# Patient Record
Sex: Male | Born: 1965 | Race: White | Hispanic: No | Marital: Married | Smoking: Former smoker
Health system: Southern US, Community
[De-identification: ages and names within clinical notes are randomized; demographics above are authoritative.]

## PROBLEM LIST (undated history)

## (undated) DIAGNOSIS — I2510 Atherosclerotic heart disease of native coronary artery without angina pectoris: Secondary | ICD-10-CM

## (undated) DIAGNOSIS — I10 Essential (primary) hypertension: Secondary | ICD-10-CM

## (undated) DIAGNOSIS — E785 Hyperlipidemia, unspecified: Secondary | ICD-10-CM

## (undated) DIAGNOSIS — E119 Type 2 diabetes mellitus without complications: Secondary | ICD-10-CM

## (undated) HISTORY — PX: CORONARY ARTERY BYPASS GRAFT: SHX141

---

## 2012-07-30 DIAGNOSIS — I10 Essential (primary) hypertension: Secondary | ICD-10-CM | POA: Diagnosis present

## 2012-07-30 DIAGNOSIS — E785 Hyperlipidemia, unspecified: Secondary | ICD-10-CM | POA: Diagnosis present

## 2016-01-11 DIAGNOSIS — I214 Non-ST elevation (NSTEMI) myocardial infarction: Secondary | ICD-10-CM | POA: Insufficient documentation

## 2016-01-11 DIAGNOSIS — E119 Type 2 diabetes mellitus without complications: Secondary | ICD-10-CM | POA: Diagnosis present

## 2019-06-12 DIAGNOSIS — Z22322 Carrier or suspected carrier of Methicillin resistant Staphylococcus aureus: Secondary | ICD-10-CM | POA: Insufficient documentation

## 2019-09-29 DIAGNOSIS — E1159 Type 2 diabetes mellitus with other circulatory complications: Secondary | ICD-10-CM | POA: Diagnosis not present

## 2019-09-29 DIAGNOSIS — L03113 Cellulitis of right upper limb: Secondary | ICD-10-CM | POA: Diagnosis not present

## 2019-10-22 DIAGNOSIS — M545 Low back pain: Secondary | ICD-10-CM | POA: Diagnosis not present

## 2019-10-22 DIAGNOSIS — F17210 Nicotine dependence, cigarettes, uncomplicated: Secondary | ICD-10-CM | POA: Diagnosis not present

## 2019-10-22 DIAGNOSIS — F419 Anxiety disorder, unspecified: Secondary | ICD-10-CM | POA: Diagnosis not present

## 2019-10-23 DIAGNOSIS — Z1210 Encounter for screening for malignant neoplasm of intestinal tract, unspecified: Secondary | ICD-10-CM | POA: Diagnosis not present

## 2019-11-20 DIAGNOSIS — F419 Anxiety disorder, unspecified: Secondary | ICD-10-CM | POA: Diagnosis not present

## 2019-11-20 DIAGNOSIS — M545 Low back pain: Secondary | ICD-10-CM | POA: Diagnosis not present

## 2019-12-04 DIAGNOSIS — J340 Abscess, furuncle and carbuncle of nose: Secondary | ICD-10-CM | POA: Diagnosis not present

## 2019-12-23 DIAGNOSIS — E139 Other specified diabetes mellitus without complications: Secondary | ICD-10-CM | POA: Diagnosis not present

## 2019-12-23 DIAGNOSIS — L03113 Cellulitis of right upper limb: Secondary | ICD-10-CM | POA: Diagnosis not present

## 2019-12-23 DIAGNOSIS — E1159 Type 2 diabetes mellitus with other circulatory complications: Secondary | ICD-10-CM | POA: Diagnosis not present

## 2019-12-23 DIAGNOSIS — E785 Hyperlipidemia, unspecified: Secondary | ICD-10-CM | POA: Diagnosis not present

## 2019-12-23 DIAGNOSIS — I2510 Atherosclerotic heart disease of native coronary artery without angina pectoris: Secondary | ICD-10-CM | POA: Diagnosis not present

## 2019-12-23 DIAGNOSIS — M545 Low back pain: Secondary | ICD-10-CM | POA: Diagnosis not present

## 2019-12-23 DIAGNOSIS — L0291 Cutaneous abscess, unspecified: Secondary | ICD-10-CM | POA: Diagnosis not present

## 2020-01-20 DIAGNOSIS — M545 Low back pain: Secondary | ICD-10-CM | POA: Diagnosis not present

## 2020-01-20 DIAGNOSIS — E785 Hyperlipidemia, unspecified: Secondary | ICD-10-CM | POA: Diagnosis not present

## 2020-01-20 DIAGNOSIS — F419 Anxiety disorder, unspecified: Secondary | ICD-10-CM | POA: Diagnosis not present

## 2020-01-20 DIAGNOSIS — F331 Major depressive disorder, recurrent, moderate: Secondary | ICD-10-CM | POA: Diagnosis not present

## 2020-02-13 DIAGNOSIS — R194 Change in bowel habit: Secondary | ICD-10-CM | POA: Diagnosis not present

## 2020-02-13 DIAGNOSIS — R112 Nausea with vomiting, unspecified: Secondary | ICD-10-CM | POA: Diagnosis not present

## 2020-02-13 DIAGNOSIS — R1084 Generalized abdominal pain: Secondary | ICD-10-CM | POA: Diagnosis not present

## 2020-02-23 DIAGNOSIS — D123 Benign neoplasm of transverse colon: Secondary | ICD-10-CM | POA: Diagnosis not present

## 2020-02-23 DIAGNOSIS — K648 Other hemorrhoids: Secondary | ICD-10-CM | POA: Diagnosis not present

## 2020-02-23 DIAGNOSIS — D122 Benign neoplasm of ascending colon: Secondary | ICD-10-CM | POA: Diagnosis not present

## 2020-02-23 DIAGNOSIS — R194 Change in bowel habit: Secondary | ICD-10-CM | POA: Diagnosis not present

## 2020-02-24 DIAGNOSIS — K219 Gastro-esophageal reflux disease without esophagitis: Secondary | ICD-10-CM | POA: Diagnosis not present

## 2020-02-24 DIAGNOSIS — Z6829 Body mass index (BMI) 29.0-29.9, adult: Secondary | ICD-10-CM | POA: Diagnosis not present

## 2020-02-24 DIAGNOSIS — K582 Mixed irritable bowel syndrome: Secondary | ICD-10-CM | POA: Diagnosis not present

## 2020-02-24 DIAGNOSIS — M545 Low back pain: Secondary | ICD-10-CM | POA: Diagnosis not present

## 2020-03-18 DIAGNOSIS — Z6829 Body mass index (BMI) 29.0-29.9, adult: Secondary | ICD-10-CM | POA: Diagnosis not present

## 2020-03-18 DIAGNOSIS — M25512 Pain in left shoulder: Secondary | ICD-10-CM | POA: Diagnosis not present

## 2020-03-25 DIAGNOSIS — F419 Anxiety disorder, unspecified: Secondary | ICD-10-CM | POA: Diagnosis not present

## 2020-03-25 DIAGNOSIS — F331 Major depressive disorder, recurrent, moderate: Secondary | ICD-10-CM | POA: Diagnosis not present

## 2020-03-25 DIAGNOSIS — M545 Low back pain: Secondary | ICD-10-CM | POA: Diagnosis not present

## 2020-03-25 DIAGNOSIS — M25512 Pain in left shoulder: Secondary | ICD-10-CM | POA: Diagnosis not present

## 2020-04-22 DIAGNOSIS — E785 Hyperlipidemia, unspecified: Secondary | ICD-10-CM | POA: Diagnosis not present

## 2020-04-22 DIAGNOSIS — I10 Essential (primary) hypertension: Secondary | ICD-10-CM | POA: Diagnosis not present

## 2020-04-22 DIAGNOSIS — M545 Low back pain, unspecified: Secondary | ICD-10-CM | POA: Diagnosis not present

## 2020-04-22 DIAGNOSIS — E139 Other specified diabetes mellitus without complications: Secondary | ICD-10-CM | POA: Diagnosis not present

## 2020-04-22 DIAGNOSIS — M25512 Pain in left shoulder: Secondary | ICD-10-CM | POA: Diagnosis not present

## 2020-04-22 DIAGNOSIS — K219 Gastro-esophageal reflux disease without esophagitis: Secondary | ICD-10-CM | POA: Diagnosis not present

## 2020-04-22 DIAGNOSIS — F419 Anxiety disorder, unspecified: Secondary | ICD-10-CM | POA: Diagnosis not present

## 2020-04-22 DIAGNOSIS — E1159 Type 2 diabetes mellitus with other circulatory complications: Secondary | ICD-10-CM | POA: Diagnosis not present

## 2020-04-22 DIAGNOSIS — Z6828 Body mass index (BMI) 28.0-28.9, adult: Secondary | ICD-10-CM | POA: Diagnosis not present

## 2020-05-19 DIAGNOSIS — F331 Major depressive disorder, recurrent, moderate: Secondary | ICD-10-CM | POA: Diagnosis not present

## 2020-05-19 DIAGNOSIS — F419 Anxiety disorder, unspecified: Secondary | ICD-10-CM | POA: Diagnosis not present

## 2020-05-19 DIAGNOSIS — Z6829 Body mass index (BMI) 29.0-29.9, adult: Secondary | ICD-10-CM | POA: Diagnosis not present

## 2020-05-19 DIAGNOSIS — M545 Low back pain, unspecified: Secondary | ICD-10-CM | POA: Diagnosis not present

## 2020-06-17 DIAGNOSIS — F419 Anxiety disorder, unspecified: Secondary | ICD-10-CM | POA: Diagnosis not present

## 2020-06-17 DIAGNOSIS — M25512 Pain in left shoulder: Secondary | ICD-10-CM | POA: Diagnosis not present

## 2020-06-17 DIAGNOSIS — M545 Low back pain, unspecified: Secondary | ICD-10-CM | POA: Diagnosis not present

## 2020-06-17 DIAGNOSIS — Z6828 Body mass index (BMI) 28.0-28.9, adult: Secondary | ICD-10-CM | POA: Diagnosis not present

## 2020-06-25 DIAGNOSIS — Z20828 Contact with and (suspected) exposure to other viral communicable diseases: Secondary | ICD-10-CM | POA: Diagnosis not present

## 2020-07-01 DIAGNOSIS — R0981 Nasal congestion: Secondary | ICD-10-CM | POA: Diagnosis not present

## 2020-07-01 DIAGNOSIS — Z20822 Contact with and (suspected) exposure to covid-19: Secondary | ICD-10-CM | POA: Diagnosis not present

## 2020-07-01 DIAGNOSIS — R059 Cough, unspecified: Secondary | ICD-10-CM | POA: Diagnosis not present

## 2020-07-01 DIAGNOSIS — Z1152 Encounter for screening for COVID-19: Secondary | ICD-10-CM | POA: Diagnosis not present

## 2020-07-01 DIAGNOSIS — J029 Acute pharyngitis, unspecified: Secondary | ICD-10-CM | POA: Diagnosis not present

## 2020-07-14 DIAGNOSIS — H00011 Hordeolum externum right upper eyelid: Secondary | ICD-10-CM | POA: Diagnosis not present

## 2020-07-14 DIAGNOSIS — Z6829 Body mass index (BMI) 29.0-29.9, adult: Secondary | ICD-10-CM | POA: Diagnosis not present

## 2020-07-15 DIAGNOSIS — M545 Low back pain, unspecified: Secondary | ICD-10-CM | POA: Diagnosis not present

## 2020-07-15 DIAGNOSIS — Z6829 Body mass index (BMI) 29.0-29.9, adult: Secondary | ICD-10-CM | POA: Diagnosis not present

## 2020-07-15 DIAGNOSIS — H00011 Hordeolum externum right upper eyelid: Secondary | ICD-10-CM | POA: Diagnosis not present

## 2020-07-15 DIAGNOSIS — E1159 Type 2 diabetes mellitus with other circulatory complications: Secondary | ICD-10-CM | POA: Diagnosis not present

## 2020-07-30 DIAGNOSIS — Z1152 Encounter for screening for COVID-19: Secondary | ICD-10-CM | POA: Diagnosis not present

## 2020-07-30 DIAGNOSIS — L02416 Cutaneous abscess of left lower limb: Secondary | ICD-10-CM | POA: Diagnosis not present

## 2020-07-30 DIAGNOSIS — Z20822 Contact with and (suspected) exposure to covid-19: Secondary | ICD-10-CM | POA: Diagnosis not present

## 2020-07-30 DIAGNOSIS — E1159 Type 2 diabetes mellitus with other circulatory complications: Secondary | ICD-10-CM | POA: Diagnosis not present

## 2020-08-19 DIAGNOSIS — F419 Anxiety disorder, unspecified: Secondary | ICD-10-CM | POA: Diagnosis not present

## 2020-08-19 DIAGNOSIS — Z6828 Body mass index (BMI) 28.0-28.9, adult: Secondary | ICD-10-CM | POA: Diagnosis not present

## 2020-08-19 DIAGNOSIS — M25512 Pain in left shoulder: Secondary | ICD-10-CM | POA: Diagnosis not present

## 2020-08-19 DIAGNOSIS — M545 Low back pain, unspecified: Secondary | ICD-10-CM | POA: Diagnosis not present

## 2020-09-09 DIAGNOSIS — L0201 Cutaneous abscess of face: Secondary | ICD-10-CM | POA: Diagnosis not present

## 2020-09-09 DIAGNOSIS — L0390 Cellulitis, unspecified: Secondary | ICD-10-CM | POA: Diagnosis not present

## 2020-09-15 DIAGNOSIS — M545 Low back pain, unspecified: Secondary | ICD-10-CM | POA: Diagnosis not present

## 2020-09-15 DIAGNOSIS — F419 Anxiety disorder, unspecified: Secondary | ICD-10-CM | POA: Diagnosis not present

## 2020-09-15 DIAGNOSIS — E1159 Type 2 diabetes mellitus with other circulatory complications: Secondary | ICD-10-CM | POA: Diagnosis not present

## 2020-09-15 DIAGNOSIS — M25512 Pain in left shoulder: Secondary | ICD-10-CM | POA: Diagnosis not present

## 2020-10-22 DIAGNOSIS — F419 Anxiety disorder, unspecified: Secondary | ICD-10-CM | POA: Diagnosis not present

## 2020-10-22 DIAGNOSIS — E1159 Type 2 diabetes mellitus with other circulatory complications: Secondary | ICD-10-CM | POA: Diagnosis not present

## 2020-10-22 DIAGNOSIS — M545 Low back pain, unspecified: Secondary | ICD-10-CM | POA: Diagnosis not present

## 2020-10-22 DIAGNOSIS — Z0000 Encounter for general adult medical examination without abnormal findings: Secondary | ICD-10-CM | POA: Diagnosis not present

## 2020-10-29 DIAGNOSIS — L0201 Cutaneous abscess of face: Secondary | ICD-10-CM | POA: Diagnosis not present

## 2020-11-25 DIAGNOSIS — M545 Low back pain, unspecified: Secondary | ICD-10-CM | POA: Diagnosis not present

## 2020-11-25 DIAGNOSIS — F419 Anxiety disorder, unspecified: Secondary | ICD-10-CM | POA: Diagnosis not present

## 2020-12-11 DIAGNOSIS — N281 Cyst of kidney, acquired: Secondary | ICD-10-CM | POA: Diagnosis not present

## 2020-12-11 DIAGNOSIS — N2889 Other specified disorders of kidney and ureter: Secondary | ICD-10-CM | POA: Diagnosis not present

## 2020-12-11 DIAGNOSIS — I10 Essential (primary) hypertension: Secondary | ICD-10-CM | POA: Diagnosis not present

## 2020-12-11 DIAGNOSIS — I700 Atherosclerosis of aorta: Secondary | ICD-10-CM | POA: Diagnosis not present

## 2020-12-11 DIAGNOSIS — R0902 Hypoxemia: Secondary | ICD-10-CM | POA: Diagnosis not present

## 2020-12-11 DIAGNOSIS — K760 Fatty (change of) liver, not elsewhere classified: Secondary | ICD-10-CM | POA: Diagnosis not present

## 2020-12-11 DIAGNOSIS — E119 Type 2 diabetes mellitus without complications: Secondary | ICD-10-CM | POA: Diagnosis not present

## 2020-12-11 DIAGNOSIS — Z79899 Other long term (current) drug therapy: Secondary | ICD-10-CM | POA: Diagnosis not present

## 2020-12-11 DIAGNOSIS — R109 Unspecified abdominal pain: Secondary | ICD-10-CM | POA: Diagnosis not present

## 2020-12-11 DIAGNOSIS — M549 Dorsalgia, unspecified: Secondary | ICD-10-CM | POA: Diagnosis not present

## 2020-12-11 DIAGNOSIS — M545 Low back pain, unspecified: Secondary | ICD-10-CM | POA: Diagnosis not present

## 2020-12-12 ENCOUNTER — Emergency Department (HOSPITAL_COMMUNITY): Payer: Federal, State, Local not specified - PPO

## 2020-12-12 ENCOUNTER — Encounter (HOSPITAL_COMMUNITY): Payer: Self-pay | Admitting: Emergency Medicine

## 2020-12-12 ENCOUNTER — Other Ambulatory Visit: Payer: Self-pay

## 2020-12-12 ENCOUNTER — Inpatient Hospital Stay (HOSPITAL_COMMUNITY)
Admission: EM | Admit: 2020-12-12 | Discharge: 2020-12-26 | DRG: 853 | Disposition: A | Discharge status: Home Health | Payer: Federal, State, Local not specified - PPO | Source: Other Acute Inpatient Hospital | Attending: Internal Medicine | Admitting: Internal Medicine

## 2020-12-12 DIAGNOSIS — F419 Anxiety disorder, unspecified: Secondary | ICD-10-CM | POA: Diagnosis present

## 2020-12-12 DIAGNOSIS — F17210 Nicotine dependence, cigarettes, uncomplicated: Secondary | ICD-10-CM | POA: Diagnosis not present

## 2020-12-12 DIAGNOSIS — G062 Extradural and subdural abscess, unspecified: Secondary | ICD-10-CM | POA: Diagnosis not present

## 2020-12-12 DIAGNOSIS — M549 Dorsalgia, unspecified: Secondary | ICD-10-CM | POA: Diagnosis present

## 2020-12-12 DIAGNOSIS — G928 Other toxic encephalopathy: Secondary | ICD-10-CM | POA: Diagnosis not present

## 2020-12-12 DIAGNOSIS — E1169 Type 2 diabetes mellitus with other specified complication: Secondary | ICD-10-CM | POA: Diagnosis present

## 2020-12-12 DIAGNOSIS — M4804 Spinal stenosis, thoracic region: Secondary | ICD-10-CM | POA: Diagnosis present

## 2020-12-12 DIAGNOSIS — K6812 Psoas muscle abscess: Secondary | ICD-10-CM

## 2020-12-12 DIAGNOSIS — Z22322 Carrier or suspected carrier of Methicillin resistant Staphylococcus aureus: Secondary | ICD-10-CM

## 2020-12-12 DIAGNOSIS — M48061 Spinal stenosis, lumbar region without neurogenic claudication: Secondary | ICD-10-CM | POA: Diagnosis present

## 2020-12-12 DIAGNOSIS — E785 Hyperlipidemia, unspecified: Secondary | ICD-10-CM | POA: Diagnosis present

## 2020-12-12 DIAGNOSIS — M4624 Osteomyelitis of vertebra, thoracic region: Secondary | ICD-10-CM | POA: Diagnosis present

## 2020-12-12 DIAGNOSIS — R7881 Bacteremia: Secondary | ICD-10-CM | POA: Diagnosis not present

## 2020-12-12 DIAGNOSIS — M4620 Osteomyelitis of vertebra, site unspecified: Secondary | ICD-10-CM | POA: Diagnosis not present

## 2020-12-12 DIAGNOSIS — E119 Type 2 diabetes mellitus without complications: Secondary | ICD-10-CM | POA: Diagnosis not present

## 2020-12-12 DIAGNOSIS — T40605A Adverse effect of unspecified narcotics, initial encounter: Secondary | ICD-10-CM | POA: Diagnosis not present

## 2020-12-12 DIAGNOSIS — R6520 Severe sepsis without septic shock: Secondary | ICD-10-CM

## 2020-12-12 DIAGNOSIS — M4626 Osteomyelitis of vertebra, lumbar region: Secondary | ICD-10-CM | POA: Diagnosis present

## 2020-12-12 DIAGNOSIS — I2510 Atherosclerotic heart disease of native coronary artery without angina pectoris: Secondary | ICD-10-CM | POA: Diagnosis not present

## 2020-12-12 DIAGNOSIS — R0902 Hypoxemia: Secondary | ICD-10-CM | POA: Diagnosis not present

## 2020-12-12 DIAGNOSIS — M4646 Discitis, unspecified, lumbar region: Secondary | ICD-10-CM | POA: Diagnosis not present

## 2020-12-12 DIAGNOSIS — A4102 Sepsis due to Methicillin resistant Staphylococcus aureus: Principal | ICD-10-CM | POA: Diagnosis present

## 2020-12-12 DIAGNOSIS — Z951 Presence of aortocoronary bypass graft: Secondary | ICD-10-CM

## 2020-12-12 DIAGNOSIS — A419 Sepsis, unspecified organism: Secondary | ICD-10-CM

## 2020-12-12 DIAGNOSIS — N281 Cyst of kidney, acquired: Secondary | ICD-10-CM | POA: Diagnosis not present

## 2020-12-12 DIAGNOSIS — M869 Osteomyelitis, unspecified: Secondary | ICD-10-CM | POA: Diagnosis not present

## 2020-12-12 DIAGNOSIS — G061 Intraspinal abscess and granuloma: Secondary | ICD-10-CM | POA: Diagnosis present

## 2020-12-12 DIAGNOSIS — E1165 Type 2 diabetes mellitus with hyperglycemia: Secondary | ICD-10-CM | POA: Diagnosis not present

## 2020-12-12 DIAGNOSIS — F32A Depression, unspecified: Secondary | ICD-10-CM | POA: Diagnosis not present

## 2020-12-12 DIAGNOSIS — B9562 Methicillin resistant Staphylococcus aureus infection as the cause of diseases classified elsewhere: Secondary | ICD-10-CM | POA: Diagnosis not present

## 2020-12-12 DIAGNOSIS — Z20822 Contact with and (suspected) exposure to covid-19: Secondary | ICD-10-CM | POA: Diagnosis not present

## 2020-12-12 DIAGNOSIS — Z79899 Other long term (current) drug therapy: Secondary | ICD-10-CM

## 2020-12-12 DIAGNOSIS — Z833 Family history of diabetes mellitus: Secondary | ICD-10-CM

## 2020-12-12 DIAGNOSIS — E871 Hypo-osmolality and hyponatremia: Secondary | ICD-10-CM | POA: Diagnosis not present

## 2020-12-12 DIAGNOSIS — E663 Overweight: Secondary | ICD-10-CM | POA: Diagnosis not present

## 2020-12-12 DIAGNOSIS — Z7984 Long term (current) use of oral hypoglycemic drugs: Secondary | ICD-10-CM

## 2020-12-12 DIAGNOSIS — Z7982 Long term (current) use of aspirin: Secondary | ICD-10-CM

## 2020-12-12 DIAGNOSIS — M545 Low back pain, unspecified: Secondary | ICD-10-CM | POA: Diagnosis not present

## 2020-12-12 DIAGNOSIS — Z6829 Body mass index (BMI) 29.0-29.9, adult: Secondary | ICD-10-CM

## 2020-12-12 DIAGNOSIS — K5903 Drug induced constipation: Secondary | ICD-10-CM | POA: Diagnosis not present

## 2020-12-12 DIAGNOSIS — K5900 Constipation, unspecified: Secondary | ICD-10-CM | POA: Clinically undetermined

## 2020-12-12 DIAGNOSIS — Z8249 Family history of ischemic heart disease and other diseases of the circulatory system: Secondary | ICD-10-CM

## 2020-12-12 DIAGNOSIS — Z419 Encounter for procedure for purposes other than remedying health state, unspecified: Secondary | ICD-10-CM

## 2020-12-12 DIAGNOSIS — I10 Essential (primary) hypertension: Secondary | ICD-10-CM | POA: Diagnosis not present

## 2020-12-12 DIAGNOSIS — R600 Localized edema: Secondary | ICD-10-CM | POA: Diagnosis not present

## 2020-12-12 DIAGNOSIS — R000 Tachycardia, unspecified: Secondary | ICD-10-CM | POA: Diagnosis not present

## 2020-12-12 DIAGNOSIS — O85 Puerperal sepsis: Secondary | ICD-10-CM

## 2020-12-12 HISTORY — DX: Atherosclerotic heart disease of native coronary artery without angina pectoris: I25.10

## 2020-12-12 HISTORY — DX: Essential (primary) hypertension: I10

## 2020-12-12 HISTORY — DX: Type 2 diabetes mellitus without complications: E11.9

## 2020-12-12 HISTORY — DX: Hyperlipidemia, unspecified: E78.5

## 2020-12-12 LAB — BASIC METABOLIC PANEL
Anion gap: 14 (ref 5–15)
BUN: 16 mg/dL (ref 6–20)
CO2: 21 mmol/L — ABNORMAL LOW (ref 22–32)
Calcium: 8.5 mg/dL — ABNORMAL LOW (ref 8.9–10.3)
Chloride: 93 mmol/L — ABNORMAL LOW (ref 98–111)
Creatinine, Ser: 1.02 mg/dL (ref 0.61–1.24)
GFR, Estimated: >60 mL/min (ref >60)
Glucose, Bld: 156 mg/dL — ABNORMAL HIGH (ref 70–99)
Potassium: 4.2 mmol/L (ref 3.5–5.1)
Sodium: 128 mmol/L — ABNORMAL LOW (ref 135–145)

## 2020-12-12 LAB — URINALYSIS, ROUTINE W REFLEX MICROSCOPIC
Glucose, UA: >=500 mg/dL — AB
Ketones, ur: >80 mg/dL — AB
Leukocytes,Ua: NEGATIVE
Nitrite: NEGATIVE
Protein, ur: 30 mg/dL — AB
Specific Gravity, Urine: 1.025 (ref 1.005–1.030)
pH: 5.5 (ref 5.0–8.0)

## 2020-12-12 LAB — URINALYSIS, MICROSCOPIC (REFLEX)
Bacteria, UA: NONE SEEN
Squamous Epithelial / HPF: NONE SEEN (ref 0–5)

## 2020-12-12 LAB — CBC
HCT: 48.4 % (ref 39.0–52.0)
Hemoglobin: 16.4 g/dL (ref 13.0–17.0)
MCH: 30.4 pg (ref 26.0–34.0)
MCHC: 33.9 g/dL (ref 30.0–36.0)
MCV: 89.6 fL (ref 80.0–100.0)
Platelets: 224 10*3/uL (ref 150–400)
RBC: 5.4 MIL/uL (ref 4.22–5.81)
RDW: 14.1 % (ref 11.5–15.5)
WBC: 11.8 10*3/uL — ABNORMAL HIGH (ref 4.0–10.5)
nRBC: 0 % (ref 0.0–0.2)

## 2020-12-12 LAB — LACTIC ACID, PLASMA: Lactic Acid, Venous: 1 mmol/L (ref 0.5–1.9)

## 2020-12-12 MED ORDER — ONDANSETRON HCL 4 MG/2ML IJ SOLN: 4.0000 mg | Freq: Once | INTRAMUSCULAR | Status: AC

## 2020-12-12 MED ORDER — HYDROMORPHONE HCL 1 MG/ML IJ SOLN
1.0000 mg | INTRAMUSCULAR | Status: DC | PRN
  Administered 2020-12-13 – 2020-12-16 (×23): 1 mg via INTRAVENOUS
  Filled 2020-12-12 (×23): qty 1

## 2020-12-12 MED ORDER — VANCOMYCIN HCL 2000 MG/400ML IV SOLN
2000.0000 mg | Freq: Once | INTRAVENOUS | Status: AC
  Administered 2020-12-12: 2000 mg via INTRAVENOUS
  Filled 2020-12-12: qty 400

## 2020-12-12 MED ORDER — DIAZEPAM 5 MG/ML IJ SOLN
10.0000 mg | Freq: Once | INTRAMUSCULAR | Status: AC
  Administered 2020-12-12: 10 mg via INTRAVENOUS
  Filled 2020-12-12: qty 2

## 2020-12-12 MED ORDER — METOPROLOL SUCCINATE ER 25 MG PO TB24
25.0000 mg | ORAL_TABLET | Freq: Every day | ORAL | Status: DC
  Administered 2020-12-12 – 2020-12-25 (×13): 25 mg via ORAL
  Filled 2020-12-12 (×14): qty 1

## 2020-12-12 MED ORDER — ICOSAPENT ETHYL 1 G PO CAPS
2.0000 g | ORAL_CAPSULE | Freq: Two times a day (BID) | ORAL | Status: DC
  Administered 2020-12-13 – 2020-12-26 (×25): 2 g via ORAL
  Filled 2020-12-12: qty 2
  Filled 2020-12-12: qty 1
  Filled 2020-12-12 (×28): qty 2

## 2020-12-12 MED ORDER — LACTATED RINGERS IV BOLUS
1000.0000 mL | Freq: Once | INTRAVENOUS | Status: AC
  Administered 2020-12-12: 1000 mL via INTRAVENOUS

## 2020-12-12 MED ORDER — FENTANYL CITRATE (PF) 100 MCG/2ML IJ SOLN
100.0000 ug | Freq: Once | INTRAMUSCULAR | Status: AC
  Administered 2020-12-12: 100 ug via INTRAVENOUS
  Filled 2020-12-12: qty 2

## 2020-12-12 MED ORDER — INSULIN ASPART 100 UNIT/ML IJ SOLN
0.0000 [IU] | Freq: Three times a day (TID) | INTRAMUSCULAR | Status: DC
  Administered 2020-12-13: 2 [IU] via SUBCUTANEOUS
  Administered 2020-12-13 – 2020-12-15 (×5): 3 [IU] via SUBCUTANEOUS
  Administered 2020-12-15: 5 [IU] via SUBCUTANEOUS
  Administered 2020-12-15: 3 [IU] via SUBCUTANEOUS
  Administered 2020-12-16: 5 [IU] via SUBCUTANEOUS
  Administered 2020-12-16: 8 [IU] via SUBCUTANEOUS
  Administered 2020-12-17: 3 [IU] via SUBCUTANEOUS
  Administered 2020-12-17: 5 [IU] via SUBCUTANEOUS
  Administered 2020-12-18 – 2020-12-19 (×3): 3 [IU] via SUBCUTANEOUS
  Administered 2020-12-19 (×2): 5 [IU] via SUBCUTANEOUS
  Administered 2020-12-20 (×2): 3 [IU] via SUBCUTANEOUS
  Administered 2020-12-20: 2 [IU] via SUBCUTANEOUS
  Administered 2020-12-21 (×2): 3 [IU] via SUBCUTANEOUS
  Administered 2020-12-22: 11 [IU] via SUBCUTANEOUS
  Administered 2020-12-22: 8 [IU] via SUBCUTANEOUS
  Administered 2020-12-23: 5 [IU] via SUBCUTANEOUS
  Administered 2020-12-23: 8 [IU] via SUBCUTANEOUS
  Administered 2020-12-23 – 2020-12-24 (×3): 5 [IU] via SUBCUTANEOUS
  Administered 2020-12-24 – 2020-12-25 (×2): 8 [IU] via SUBCUTANEOUS
  Administered 2020-12-25: 3 [IU] via SUBCUTANEOUS
  Administered 2020-12-26: 5 [IU] via SUBCUTANEOUS
  Administered 2020-12-26: 3 [IU] via SUBCUTANEOUS

## 2020-12-12 MED ORDER — GADOBUTROL 1 MMOL/ML IV SOLN
9.5000 mL | Freq: Once | INTRAVENOUS | Status: AC | PRN
  Administered 2020-12-12: 9.5 mL via INTRAVENOUS

## 2020-12-12 MED ORDER — SODIUM CHLORIDE 0.9% FLUSH
3.0000 mL | Freq: Two times a day (BID) | INTRAVENOUS | Status: DC
  Administered 2020-12-12 – 2020-12-21 (×16): 3 mL via INTRAVENOUS

## 2020-12-12 MED ORDER — LACTATED RINGERS IV BOLUS
2000.0000 mL | Freq: Once | INTRAVENOUS | Status: AC
  Administered 2020-12-13: 2000 mL via INTRAVENOUS

## 2020-12-12 MED ORDER — BISACODYL 5 MG PO TBEC
5.0000 mg | DELAYED_RELEASE_TABLET | Freq: Every day | ORAL | Status: DC | PRN
  Administered 2020-12-15 – 2020-12-26 (×2): 5 mg via ORAL
  Filled 2020-12-12 (×2): qty 1

## 2020-12-12 MED ORDER — ASPIRIN EC 81 MG PO TBEC
81.0000 mg | DELAYED_RELEASE_TABLET | Freq: Every morning | ORAL | Status: DC
  Administered 2020-12-13 – 2020-12-19 (×6): 81 mg via ORAL
  Filled 2020-12-12 (×7): qty 1

## 2020-12-12 MED ORDER — ENOXAPARIN SODIUM 40 MG/0.4ML IJ SOSY
40.0000 mg | PREFILLED_SYRINGE | Freq: Every day | INTRAMUSCULAR | Status: DC
  Administered 2020-12-13 – 2020-12-20 (×7): 40 mg via SUBCUTANEOUS
  Filled 2020-12-12 (×8): qty 0.4

## 2020-12-12 MED ORDER — GABAPENTIN 100 MG PO CAPS
200.0000 mg | ORAL_CAPSULE | Freq: Three times a day (TID) | ORAL | Status: DC | PRN
  Administered 2020-12-13 – 2020-12-15 (×3): 200 mg via ORAL
  Filled 2020-12-12 (×3): qty 2

## 2020-12-12 MED ORDER — SODIUM CHLORIDE 0.9 % IV SOLN
2.0000 g | INTRAVENOUS | Status: DC
  Administered 2020-12-13: 2 g via INTRAVENOUS
  Filled 2020-12-12: qty 20

## 2020-12-12 MED ORDER — MORPHINE SULFATE (PF) 4 MG/ML IV SOLN
4.0000 mg | Freq: Once | INTRAVENOUS | Status: AC
  Administered 2020-12-12: 4 mg via INTRAVENOUS
  Filled 2020-12-12: qty 1

## 2020-12-12 MED ORDER — AMLODIPINE BESYLATE 5 MG PO TABS
2.5000 mg | ORAL_TABLET | Freq: Every morning | ORAL | Status: DC
  Administered 2020-12-13 – 2020-12-19 (×6): 2.5 mg via ORAL
  Filled 2020-12-12 (×7): qty 1

## 2020-12-12 MED ORDER — VENLAFAXINE HCL ER 75 MG PO CP24
75.0000 mg | ORAL_CAPSULE | Freq: Every day | ORAL | Status: DC
  Administered 2020-12-13 – 2020-12-26 (×13): 75 mg via ORAL
  Filled 2020-12-12 (×16): qty 1

## 2020-12-12 MED ORDER — ACETAMINOPHEN 650 MG RE SUPP: 650.0000 mg | Freq: Four times a day (QID) | RECTAL | Status: DC | PRN

## 2020-12-12 MED ORDER — PANTOPRAZOLE SODIUM 40 MG PO TBEC
40.0000 mg | DELAYED_RELEASE_TABLET | Freq: Every day | ORAL | Status: DC
  Administered 2020-12-13 – 2020-12-26 (×13): 40 mg via ORAL
  Filled 2020-12-12 (×14): qty 1

## 2020-12-12 MED ORDER — DICLOFENAC SODIUM 1 % EX GEL
1.0000 "application " | Freq: Four times a day (QID) | CUTANEOUS | Status: DC | PRN
  Administered 2020-12-14 – 2020-12-21 (×9): 1 via TOPICAL
  Filled 2020-12-12: qty 100

## 2020-12-12 MED ORDER — HYDROMORPHONE HCL 1 MG/ML IJ SOLN
1.0000 mg | Freq: Once | INTRAMUSCULAR | Status: AC
  Administered 2020-12-12: 1 mg via INTRAVENOUS
  Filled 2020-12-12: qty 1

## 2020-12-12 MED ORDER — ACETAMINOPHEN 325 MG PO TABS
650.0000 mg | ORAL_TABLET | Freq: Four times a day (QID) | ORAL | Status: DC | PRN
  Administered 2020-12-14 – 2020-12-24 (×5): 650 mg via ORAL
  Filled 2020-12-12 (×6): qty 2

## 2020-12-12 MED ORDER — HYDROMORPHONE HCL 1 MG/ML IJ SOLN
2.0000 mg | Freq: Once | INTRAMUSCULAR | Status: AC
  Administered 2020-12-12: 2 mg via INTRAVENOUS
  Filled 2020-12-12: qty 2

## 2020-12-12 MED ORDER — ATORVASTATIN CALCIUM 80 MG PO TABS
80.0000 mg | ORAL_TABLET | Freq: Every day | ORAL | Status: DC
  Administered 2020-12-13 – 2020-12-14 (×2): 80 mg via ORAL
  Filled 2020-12-12 (×2): qty 1

## 2020-12-12 MED ORDER — POLYETHYLENE GLYCOL 3350 17 G PO PACK: 17.0000 g | PACK | Freq: Every day | ORAL | Status: DC | PRN

## 2020-12-12 MED ORDER — HYDROCODONE-ACETAMINOPHEN 10-325 MG PO TABS
0.5000 | ORAL_TABLET | Freq: Three times a day (TID) | ORAL | Status: DC | PRN
Start: 2020-12-12 — End: 2020-12-16
  Administered 2020-12-13 – 2020-12-15 (×4): 1 via ORAL
  Filled 2020-12-12 (×4): qty 1

## 2020-12-12 MED ORDER — ALPRAZOLAM 0.25 MG PO TABS
0.2500 mg | ORAL_TABLET | Freq: Every evening | ORAL | Status: DC | PRN
  Administered 2020-12-13 – 2020-12-22 (×8): 0.25 mg via ORAL
  Filled 2020-12-12 (×9): qty 1

## 2020-12-12 MED ORDER — ONDANSETRON HCL 4 MG/2ML IJ SOLN
INTRAMUSCULAR | Status: AC
  Administered 2020-12-12: 4 mg via INTRAVENOUS
  Filled 2020-12-12: qty 2

## 2020-12-12 MED ORDER — VANCOMYCIN HCL 1250 MG/250ML IV SOLN
1250.0000 mg | Freq: Two times a day (BID) | INTRAVENOUS | Status: DC
  Administered 2020-12-13 – 2020-12-14 (×4): 1250 mg via INTRAVENOUS
  Filled 2020-12-12 (×5): qty 250

## 2020-12-12 MED ORDER — TIZANIDINE HCL 2 MG PO TABS
4.0000 mg | ORAL_TABLET | Freq: Three times a day (TID) | ORAL | Status: DC | PRN
  Administered 2020-12-13 – 2020-12-20 (×10): 4 mg via ORAL
  Filled 2020-12-12 (×10): qty 2

## 2020-12-12 MED ORDER — FUROSEMIDE 20 MG PO TABS
20.0000 mg | ORAL_TABLET | Freq: Every morning | ORAL | Status: DC
  Administered 2020-12-13 – 2020-12-16 (×4): 20 mg via ORAL
  Filled 2020-12-12 (×4): qty 1

## 2020-12-12 MED ORDER — LOSARTAN POTASSIUM 25 MG PO TABS
25.0000 mg | ORAL_TABLET | Freq: Every morning | ORAL | Status: DC
  Administered 2020-12-13 – 2020-12-19 (×6): 25 mg via ORAL
  Filled 2020-12-12 (×7): qty 1

## 2020-12-12 MED ORDER — LIDOCAINE 5 % EX PTCH
1.0000 | MEDICATED_PATCH | Freq: Every day | CUTANEOUS | Status: DC | PRN
  Administered 2020-12-15: 1 via TRANSDERMAL
  Filled 2020-12-12 (×2): qty 1

## 2020-12-12 NOTE — Progress Notes (Signed)
Pharmacy Antibiotic Note  Louis [name] is a 55 y.o. male admitted on 12/12/2020 with  osteomyelitis/discitis .  Pharmacy has been consulted for Vancomycin dosing. WBC is mildly elevated. Renal function good.   Plan: Vancomycin 2000 mg IV x 1, then 1250 mg IV q12h >>Estimated AUC: 487 F/U gram negative coverage if desired by provider Trend WBC, temp, renal function  F/U infectious work-up Drug levels as indicated   Height: 5\' 11"  (180.3 cm) Weight: 95.3 kg (210 lb) IBW/kg (Calculated) : 75.3  Temp (24hrs), Avg:98.8 F (37.1 C), Min:98.8 F (37.1 C), Max:98.8 F (37.1 C)  Recent Labs  Lab 12/12/20 2044  WBC 11.8*  CREATININE 1.02    Estimated Creatinine Clearance: 94.1 mL/min (by C-G formula based on SCr of 1.02 mg/dL).    No Known Allergies  2045, PharmD, BCPS Clinical Pharmacist Phone: [phone]

## 2020-12-12 NOTE — ED Provider Notes (Signed)
[facility] EMERGENCY DEPARTMENT Provider Note   CSN: [id number] Arrival date & time: 12/12/20  1556     History Chief Complaint  Patient presents with   Back Pain    Louis [name] is a 55 y.o. male.  HPI    55 year old man complaining of low back pain radiating to the left knee.  Pain began on Friday and was atraumatic in nature.  Patient was seen at [facility] yesterday and had a CT of abdomen and pelvis without contrast.  There is no evidence of urolithiasis, hydronephrosis COVID, or other acute findings.  Mild hepatic steatosis.  A 4 cm cystic lesion was noted in the left kidney.  No musculoskeletal suspicious lesions were noted.  Patient identifies this pain is 10 out of 10 and similar to prior pain from his back with a disc.  He denies weakness, and numbness, or tingling. Past Medical History:  Diagnosis Date   CAD (coronary artery disease)    DM (diabetes mellitus) (HCC)    Hyperlipidemia    Hypertension     There are no problems to display for this patient.   History reviewed. No pertinent surgical history.     No family history on file.     Home Medications Prior to Admission medications   Not on File    Allergies    Patient has no allergy information on record.  Review of Systems   Review of Systems  All other systems reviewed and are negative.  Physical Exam Updated Vital Signs BP (!) 177/91 (BP Location: Right Arm)   Pulse (!) 109   Resp 20   Ht 1.803 m (5\' 11" )   Wt 95.3 kg   SpO2 99%   BMI 29.29 kg/m   Physical Exam Vitals and nursing note reviewed.  Constitutional:      Appearance: Normal appearance.  HENT:     Head: Normocephalic.     Right Ear: External ear normal.     Left Ear: External ear normal.     Nose: Nose normal.     Mouth/Throat:     Pharynx: Oropharynx is clear.  Eyes:     Pupils: Pupils are equal, round, and reactive to light.  Cardiovascular:     Rate and Rhythm: Normal rate and regular  rhythm.     Pulses: Normal pulses.  Pulmonary:     Effort: Pulmonary effort is normal.  Abdominal:     General: Abdomen is flat.     Palpations: Abdomen is soft.  Musculoskeletal:        General: Normal range of motion.     Cervical back: Normal range of motion.     Comments: External signs of trauma, redness, or swelling of back No tenderness palpation   Skin:    General: Skin is warm.     Capillary Refill: Capillary refill takes less than 2 seconds.  Neurological:     General: No focal deficit present.     Mental Status: He is alert and oriented to person, place, and time.     Cranial Nerves: No cranial nerve deficit.     Sensory: No sensory deficit.     Motor: No weakness.     Coordination: Coordination normal.     Gait: Gait normal.     Deep Tendon Reflexes: Reflexes normal.  Psychiatric:        Mood and Affect: Mood normal.    ED Results / Procedures / Treatments   Labs (all labs ordered are  listed, but only abnormal results are displayed) Labs Reviewed - No data to display  EKG None  Radiology MR LUMBAR SPINE WO CONTRAST  Result Date: 12/12/2020 CLINICAL DATA:  Severe left-sided low back pain. EXAM: MRI LUMBAR SPINE WITHOUT CONTRAST TECHNIQUE: Multiplanar, multisequence MR imaging of the lumbar spine was performed. No intravenous contrast was administered. COMPARISON:  CT urogram 12/11/2020 FINDINGS: Segmentation: 5 non rib-bearing lumbar type vertebral bodies are present. The lowest fully formed vertebral body is L5. Alignment: No significant listhesis is present. There is some straightening of the normal cervical lordosis. Vertebrae: Edematous endplate changes are present along the left side of the endplates at L3-4 and L4-5. Diffuse edematous changes are present at L5-S1. Marrow signal and vertebral body heights are otherwise normal. Conus medullaris and cauda equina: Conus extends to the L1-2 level. Conus and cauda equina appear normal. Paraspinal and other soft  tissues: Edematous changes are present in the left paraspinous soft tissues, centered at L3-4 L4-5. There are changes into the left psoas muscle without a discrete abscess. Disc levels: L1-2: Negative. L2-3: Broad-based disc protrusion is asymmetric to the right. No significant stenosis. L3-4: Abnormal fluid signal is present in the disc space. Broad-based disc protrusion is present. Moderate left and mild right foraminal narrowing is present. L4-5: A broad-based disc protrusion is present. Mild facet hypertrophy is noted bilaterally. Crowding of nerve roots is present. Abnormal fluid signal is present in the disc space, worse on the left. Moderate foraminal narrowing is present bilaterally. L5-S1: Broad-based disc protrusion is asymmetric to the right. Moderate foraminal narrowing is worse on the right. Fluid signal is present in the disc space. IMPRESSION: 1. Abnormal fluid signal in the disc spaces at L3-4, L4-5, and L5-S1 concerning for early disc osteomyelitis. 2. Edematous changes in the left paraspinous soft tissues and left psoas muscle compatible with acute myositis. No discrete abscess is present. 3. Moderate left and mild right foraminal stenosis at L3-4. 4. Moderate foraminal narrowing bilaterally at L4-5 and L5-S1. These results were called by telephone at the time of interpretation on 12/12/2020 at 8:34 pm to provider [facility]a Shawn , who verbally acknowledged these results. Electronically Signed   By: Marin Roberts M.D.   On: 12/12/2020 20:36    Procedures .Critical Care  Date/Time: 12/12/2020 11:53 PM Performed by: Margarita Grizzle, MD Authorized by: Margarita Grizzle, MD   Critical care provider statement:    Critical care end time:  12/12/2020 11:53 PM   Medications Ordered in ED Medications  morphine 4 MG/ML injection 4 mg (has no administration in time range)    ED Course  I have reviewed the triage vital signs and the nursing notes.  Pertinent labs & imaging results that were  available during my care of the patient were reviewed by me and considered in my medical decision making (see chart for details).    MDM Rules/Calculators/A&P                         55 year old male presents today with severe low back pain.  CT obtained yesterday at [facility] showed questionable area and mild the left renal that is likely unrelated to this pain.  However due to previous CT: Patient MRI of his back and abdomen today.  MRI is concerning for abnormal fluid signal consistent with osteomyelitis L3-S1.  He does have a history of MRSA on his records from the past.  Patient is dosed in the ED with vancomycin per pharmacy.  He  has edematous changes in the left paraspinous soft tissue and left psoas consistent consistent with acute myositis.  No discrete abscess is present.  Care was discussed with neurosurgery, who does not feel the need to be involved at this time.  Plan medicine admission for IV antibiotics and management. 11:52 PM Lactic acid normal.  HR decreased to 110, bp stable/high. Patient appears stable for admission.  Final Clinical Impression(s) / ED Diagnoses Final diagnoses:  Sepsis [facility])    Rx / DC Orders ED Discharge Orders     None        Margarita Grizzle, MD 12/12/20 2353

## 2020-12-12 NOTE — ED Notes (Signed)
Patient transported to MRI 

## 2020-12-12 NOTE — ED Notes (Signed)
Pt transported from MRI 

## 2020-12-12 NOTE — H&P (Addendum)
History and Physical   Louis Mollony Lenart ZOX:[id number] DOB: 03/04/1966 DOA: 12/12/2020  PCP: Pcp, No   Patient coming from: Home  Chief Complaint: Back pain  HPI: Louis [name] is a 55 y.o. male with medical history significant of CAD, hyperlipidemia, hypertension, diabetes, anxiety, depression who presents with left-sided back pain for the past 2 days. Patient states that he has had left-sided back pain for the past 2 days.  [facility] and had [facility] CT done there which showed renal cyst but no other explanation for his back pain.  States that he continues to have 10 out of 10 back pain which is difficult to describe, but is located in his lower back along the midline with intermittent radiation to his leg. He also reports some dry heaves.  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  He denies fevers, chills, chest pain, shortness of breath, abdominal pain, constipation, diarrhea  ED Course: Vital signs in the ED significant for initial blood pressure in the 170s increased to the 200s systolic likely due to pain.  Initial heart rate in the 100s increasing to the 120s likely due to pain.  Lab work-up showed sodium 128, chloride 93, bicarb 21, glucose 156, calcium 8.5.  Leukocytosis to 11.8 on CBC.  Urine culture, blood culture, lactic acid, PTT, PT, INR pending.  Patient received pain medication, vancomycin, Zofran, liter fluids in the ED.  Review of Systems: As per HPI otherwise all other systems reviewed and are negative.  Past Medical History:  Diagnosis Date   CAD (coronary artery disease)    DM (diabetes mellitus) (HCC)    Hyperlipidemia    Hypertension     History reviewed. No pertinent surgical history.  Social History  has no history on file for tobacco use, alcohol use, and drug use.  No Known Allergies  History reviewed. No pertinent family history.  Prior to Admission medications   Medication Sig Start Date End Date Taking? Authorizing Provider  acetaminophen  (TYLENOL) 500 MG tablet Take 2,000 mg by mouth daily as needed (pain).   Yes [provider]  ALPRAZolam (XANAX) 0.25 MG tablet Take 0.25 mg by mouth at bedtime as needed for anxiety or sleep.   Yes [provider]  amLODipine (NORVASC) 2.5 MG tablet Take 2.5 mg by mouth every morning.   Yes [provider]  aspirin EC 81 MG tablet Take 81 mg by mouth every morning. Swallow whole.   Yes [provider]  atorvastatin (LIPITOR) 80 MG tablet Take 80 mg by mouth at bedtime.   Yes [provider]  dapagliflozin propanediol (FARXIGA) 10 MG TABS tablet Take 10 mg by mouth every morning.   Yes [provider]  diclofenac Sodium (VOLTAREN) 1 % GEL Apply 1 application topically 4 (four) times daily as needed (pain).   Yes [provider]  furosemide (LASIX) 20 MG tablet Take 20 mg by mouth every morning.   Yes [provider]  gabapentin (NEURONTIN) 100 MG capsule Take 200 mg by mouth 3 (three) times daily as needed (pain).   Yes [provider]  glipiZIDE (GLUCOTROL) 5 MG tablet Take 5 mg by mouth 2 (two) times daily.   Yes [provider]  HYDROcodone-acetaminophen (NORCO) 10-325 MG tablet Take 0.5-1 tablets by mouth every 8 (eight) hours as needed (pain).   Yes [provider]  icosapent Ethyl (VASCEPA) 1 g capsule Take 2 g by mouth 2 (two) times daily.   Yes  [provider]  lidocaine (LIDODERM) 5 % Place 1 patch onto the skin daily as needed (pain). Remove & Discard patch within 12 hours or as directed by MD   Yes [provider]  losartan (COZAAR) 25 MG tablet Take 25 mg by mouth every morning.   Yes [provider]  metFORMIN (GLUCOPHAGE) 500 MG tablet Take 500-1,000 mg by mouth See admin instructions. Take 2 tablets (1000 mg) by mouth every morning and 1 tablet (500 mg) at night   Yes [provider]  metoprolol succinate (TOPROL-XL) 25 MG 24 hr tablet Take 25 mg by  mouth at bedtime.   Yes [provider]  omeprazole (PRILOSEC) 20 MG capsule Take 20 mg by mouth every morning.   Yes [provider]  rosuvastatin (CRESTOR) 20 MG tablet Take 20 mg by mouth every Sunday. night   Yes [provider]  tiZANidine (ZANAFLEX) 4 MG tablet Take 4 mg by mouth 3 (three) times daily as needed for muscle spasms.   Yes [provider]  venlafaxine XR (EFFEXOR-XR) 75 MG 24 hr capsule Take 75 mg by mouth every morning.   Yes [provider]    Physical Exam: Vitals:   12/12/20 1605 12/12/20 1606 12/12/20 2122  BP: (!) 177/91  (!) 210/110  Pulse: (!) 109  (!) 132  Resp: 20  20  Temp:   98.8 F (37.1 C)  TempSrc:   Oral  SpO2: 99%  95%  Weight:  95.3 kg   Height:  5\' 11"  (1.803 m)    Physical Exam Constitutional:      General: He is not in acute distress.    Appearance: Normal appearance.  HENT:     Head: Normocephalic and atraumatic.     Mouth/Throat:     Mouth: Mucous membranes are moist.     Pharynx: Oropharynx is clear.  Eyes:     Extraocular Movements: Extraocular movements intact.     Pupils: Pupils are equal, round, and reactive to light.  Cardiovascular:     Rate and Rhythm: Regular rhythm. Tachycardia present.     Pulses: Normal pulses.     Heart sounds: Normal heart sounds.  Pulmonary:     Effort: Pulmonary effort is normal. No respiratory distress.     Breath sounds: Normal breath sounds.  Abdominal:     General: Bowel sounds are normal. There is no distension.     Palpations: Abdomen is soft.     Tenderness: There is no abdominal tenderness.  Musculoskeletal:        General: No swelling or deformity.  Skin:    General: Skin is warm and dry.  Neurological:     General: No focal deficit present.     Mental Status: Mental status is at baseline.   Labs on Admission: I have personally reviewed following labs and imaging studies  CBC: Recent Labs  Lab 12/12/20 2044  WBC 11.8*  HGB 16.4   HCT 48.4  MCV 89.6  PLT 224    Basic Metabolic Panel: Recent Labs  Lab 12/12/20 2044  NA 128*  K 4.2  CL 93*  CO2 21*  GLUCOSE 156*  BUN 16  CREATININE 1.02  CALCIUM 8.5*    GFR: Estimated Creatinine Clearance: 94.1 mL/min (by C-G formula based on SCr of 1.02 mg/dL).  Liver Function Tests: No results for input(s): AST, ALT, ALKPHOS, BILITOT, PROT, ALBUMIN in the last 168 hours.  Urine analysis: No results found for: COLORURINE, APPEARANCEUR, LABSPEC, PHURINE, GLUCOSEU,  HGBUR, BILIRUBINUR, KETONESUR, PROTEINUR, UROBILINOGEN, NITRITE, LEUKOCYTESUR  Radiological Exams on Admission: DG Chest 1 View  Result Date: 12/12/2020 CLINICAL DATA:  Sepsis.  Patient reports back and leg pain. EXAM: CHEST  1 VIEW COMPARISON:  Lung bases from abdominal CT yesterday. FINDINGS: Post median sternotomy and CABG. Normal heart size for technique. No focal airspace disease. No pleural effusion, pneumothorax, or pulmonary edema. No acute osseous abnormalities are seen. IMPRESSION: No acute abnormality. Electronically Signed   By: Narda Rutherford M.D.   On: 12/12/2020 23:03   MR LUMBAR SPINE WO CONTRAST  Result Date: 12/12/2020 CLINICAL DATA:  Severe left-sided low back pain. EXAM: MRI LUMBAR SPINE WITHOUT CONTRAST TECHNIQUE: Multiplanar, multisequence MR imaging of the lumbar spine was performed. No intravenous contrast was administered. COMPARISON:  CT urogram 12/11/2020 FINDINGS: Segmentation: 5 non rib-bearing lumbar type vertebral bodies are present. The lowest fully formed vertebral body is L5. Alignment: No significant listhesis is present. There is some straightening of the normal cervical lordosis. Vertebrae: Edematous endplate changes are present along the left side of the endplates at L3-4 and L4-5. Diffuse edematous changes are present at L5-S1. Marrow signal and vertebral body heights are otherwise normal. Conus medullaris and cauda equina: Conus extends to the L1-2 level. Conus and cauda  equina appear normal. Paraspinal and other soft tissues: Edematous changes are present in the left paraspinous soft tissues, centered at L3-4 L4-5. There are changes into the left psoas muscle without a discrete abscess. Disc levels: L1-2: Negative. L2-3: Broad-based disc protrusion is asymmetric to the right. No significant stenosis. L3-4: Abnormal fluid signal is present in the disc space. Broad-based disc protrusion is present. Moderate left and mild right foraminal narrowing is present. L4-5: A broad-based disc protrusion is present. Mild facet hypertrophy is noted bilaterally. Crowding of nerve roots is present. Abnormal fluid signal is present in the disc space, worse on the left. Moderate foraminal narrowing is present bilaterally. L5-S1: Broad-based disc protrusion is asymmetric to the right. Moderate foraminal narrowing is worse on the right. Fluid signal is present in the disc space. IMPRESSION: 1. Abnormal fluid signal in the disc spaces at L3-4, L4-5, and L5-S1 concerning for early disc osteomyelitis. 2. Edematous changes in the left paraspinous soft tissues and left psoas muscle compatible with acute myositis. No discrete abscess is present. 3. Moderate left and mild right foraminal stenosis at L3-4. 4. Moderate foraminal narrowing bilaterally at L4-5 and L5-S1. These results were called by telephone at the time of interpretation on 12/12/2020 at 8:34 pm to provider [facility] RAY , who verbally acknowledged these results. Electronically Signed   By: Marin Roberts M.D.   On: 12/12/2020 20:36   MR Abdomen W or Wo Contrast  Result Date: 12/12/2020 CLINICAL DATA:  Evaluate left kidney cyst EXAM: MRI ABDOMEN WITHOUT AND WITH CONTRAST TECHNIQUE: Multiplanar multisequence MR imaging of the abdomen was performed both before and after the administration of intravenous contrast. CONTRAST:  9.37mL GADAVIST GADOBUTROL 1 MMOL/ML IV SOLN COMPARISON:  CT AP 12/11/2020 FINDINGS: Lower chest: No acute findings.  Hepatobiliary: No mass or other parenchymal abnormality identified. Pancreas: No mass, inflammatory changes, or other parenchymal abnormality identified. Spleen:  Within normal limits in size and appearance. Adrenals/Urinary Tract: Normal adrenal glands. Bilateral perinephric fat stranding is identified. No hydronephrosis identified bilaterally. Cyst arising off the inferior pole of the left kidney measures 4.2 by 3.3 cm, image 35/7. There is no internal septation or mural nodule identified. No enhancement identified. Stomach/Bowel: Visualized portions within the abdomen are unremarkable.  Vascular/Lymphatic: No pathologically enlarged lymph nodes identified. No abdominal aortic aneurysm demonstrated. Other:  None. Musculoskeletal: There are signs of discitis/osteomyelitis involving the L3-4, L4-5 and L5-S1 levels. Surrounding edema and enhancement extends into the left psoas muscle. No discrete fluid collection identified to suggest abscess. IMPRESSION: 1. 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 No suspicious kidney lesions identified at this time. 2. Findings compatible with discitis/osteomyelitis involving the L3-4, L4-5 and L5-S1 levels. Surrounding edema and enhancement extends into the left psoas muscle. No discrete fluid collection identified to suggest abscess. See lumbar spine report from today. Electronically Signed   By: Signa Kell M.D.   On: 12/12/2020 20:49    EKG: Not yet performed.  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  Due to persistent back pain had MRI performed here which showed concern for  osteomyelitis/discitis at L3-L4, L4-L5, L5-S1.  Also concern for surrounding myositis without abscess noted. > Patient started on vancomycin and neurosurgery was consulted, neurosurgery states there is no need for surgical intervention at this time and to reconsult if needed. - Monitor on telemetry - Continue vancomycin, add ceftriaxone - Trend fever curve and white blood cell count - Consider ID consult - Continue as needed pain medication (Dilaudid)  Tachycardia > Heart rate in the 100s to 120s in the ED likely driven by his pain. - Continue pain medication as above - F/U ECG - Give PM Metoprolol  CAD Hyperlipidemia Hypertension > Significantly elevated blood pressure in the ED initially in the 170s and then up to the 200s likely due to pain. - Continue home amlodipine, Lasix, losartan, metoprolol, Vascepa, atorvastatin - Holding once weekly rosuvastatin  Diabetes - SSI  Anxiety Depression - Continue home Xanax and venlafaxine  DVT prophylaxis: Lovenox  Code Status:   Full  Family Communication:  Wife updated at bedside Disposition Plan:   Patient is from:  Home  Anticipated DC to:  Home  Anticipated DC date:  1 to 7 days  Anticipated DC barriers: None  Consults called:  Neurosurgery consulted in the ED, state they are not needed at this time.  Could consider ID consult tomorrow.   Admission status:  Observation, telemetry   Severity of Illness: The appropriate patient status for this patient is OBSERVATION. Observation status is judged to be reasonable and necessary in order to provide the required intensity of service to ensure the patient's safety. The patient's presenting symptoms, physical exam findings, and initial radiographic and laboratory data in the context of their medical condition is felt to place them at decreased risk for further clinical deterioration. Furthermore, it is anticipated that the patient will be medically stable for discharge from the hospital  within 2 midnights of admission. The following factors support the patient status of observation.   " The patient's presenting symptoms include back pain. " The physical exam findings include back pain, tachycardia. " The initial radiographic and laboratory data are leukocytosis to 11.8.  MRI of the L-spine with concern for osteomyelitis/discitis at L3-L4, L4-L5, L5-S1.  Abdomen is changes concerning for myositis surrounding without abscess.  Synetta Fail MD [facility]  How to contact the  [facility] Attending or Consulting provider 7A - 7P or covering provider during after hours 7P -7A, for this patient?   Check the care team in [facility] and look for a) attending/consulting [facility] provider listed and b) the [facility] team listed Log into [email or web address] and use [place]'s universal password to access. If you do not have the password, please contact the hospital operator. Locate the [facility] provider you are looking for under [facility] and page to a number that you can be directly reached. If you still have difficulty reaching the provider, please page the [facility] (Director on Call) for the Hospitalists listed on amion for assistance.  12/12/2020, 11:20 PM

## 2020-12-12 NOTE — ED Triage Notes (Signed)
Pt BIB [facility] with complaints of left side back pain that started yesterday. Pt was seen at [facility] for same and diagnosed with just lower back pain along with a renal mass. Pt was d/c'ed with hydrocodone and sent home. Pt still complaining of 10/10 severe lower left back pain that radiates down to the L knee. When asked to describe the pain the patient states, "it just hurts". Pt dry heaving during triage. EMS administered 4 mg zofran IV. Pt denies chest pain, SoB, issues with urination.  VSS per EMS.

## 2020-12-13 ENCOUNTER — Encounter (HOSPITAL_COMMUNITY): Payer: Self-pay | Admitting: Internal Medicine

## 2020-12-13 DIAGNOSIS — I2510 Atherosclerotic heart disease of native coronary artery without angina pectoris: Secondary | ICD-10-CM | POA: Diagnosis not present

## 2020-12-13 DIAGNOSIS — A4102 Sepsis due to Methicillin resistant Staphylococcus aureus: Secondary | ICD-10-CM | POA: Diagnosis not present

## 2020-12-13 DIAGNOSIS — F419 Anxiety disorder, unspecified: Secondary | ICD-10-CM | POA: Diagnosis present

## 2020-12-13 DIAGNOSIS — M5126 Other intervertebral disc displacement, lumbar region: Secondary | ICD-10-CM | POA: Diagnosis not present

## 2020-12-13 DIAGNOSIS — E785 Hyperlipidemia, unspecified: Secondary | ICD-10-CM | POA: Diagnosis not present

## 2020-12-13 DIAGNOSIS — B9562 Methicillin resistant Staphylococcus aureus infection as the cause of diseases classified elsewhere: Secondary | ICD-10-CM | POA: Diagnosis not present

## 2020-12-13 DIAGNOSIS — M4649 Discitis, unspecified, multiple sites in spine: Secondary | ICD-10-CM | POA: Diagnosis not present

## 2020-12-13 DIAGNOSIS — Z79899 Other long term (current) drug therapy: Secondary | ICD-10-CM | POA: Diagnosis not present

## 2020-12-13 DIAGNOSIS — F32A Depression, unspecified: Secondary | ICD-10-CM | POA: Diagnosis present

## 2020-12-13 DIAGNOSIS — M545 Low back pain, unspecified: Secondary | ICD-10-CM | POA: Diagnosis not present

## 2020-12-13 DIAGNOSIS — R4182 Altered mental status, unspecified: Secondary | ICD-10-CM | POA: Diagnosis not present

## 2020-12-13 DIAGNOSIS — G061 Intraspinal abscess and granuloma: Secondary | ICD-10-CM | POA: Diagnosis not present

## 2020-12-13 DIAGNOSIS — Z20822 Contact with and (suspected) exposure to covid-19: Secondary | ICD-10-CM | POA: Diagnosis not present

## 2020-12-13 DIAGNOSIS — Z6829 Body mass index (BMI) 29.0-29.9, adult: Secondary | ICD-10-CM | POA: Diagnosis not present

## 2020-12-13 DIAGNOSIS — G9520 Unspecified cord compression: Secondary | ICD-10-CM | POA: Diagnosis not present

## 2020-12-13 DIAGNOSIS — I10 Essential (primary) hypertension: Secondary | ICD-10-CM | POA: Diagnosis not present

## 2020-12-13 DIAGNOSIS — M50223 Other cervical disc displacement at C6-C7 level: Secondary | ICD-10-CM | POA: Diagnosis not present

## 2020-12-13 DIAGNOSIS — R7881 Bacteremia: Secondary | ICD-10-CM | POA: Diagnosis not present

## 2020-12-13 DIAGNOSIS — K5730 Diverticulosis of large intestine without perforation or abscess without bleeding: Secondary | ICD-10-CM | POA: Diagnosis not present

## 2020-12-13 DIAGNOSIS — E1165 Type 2 diabetes mellitus with hyperglycemia: Secondary | ICD-10-CM | POA: Diagnosis present

## 2020-12-13 DIAGNOSIS — M4804 Spinal stenosis, thoracic region: Secondary | ICD-10-CM | POA: Diagnosis not present

## 2020-12-13 DIAGNOSIS — K6812 Psoas muscle abscess: Secondary | ICD-10-CM | POA: Diagnosis not present

## 2020-12-13 DIAGNOSIS — M869 Osteomyelitis, unspecified: Secondary | ICD-10-CM | POA: Diagnosis not present

## 2020-12-13 DIAGNOSIS — E1169 Type 2 diabetes mellitus with other specified complication: Secondary | ICD-10-CM | POA: Diagnosis present

## 2020-12-13 DIAGNOSIS — K6389 Other specified diseases of intestine: Secondary | ICD-10-CM | POA: Diagnosis not present

## 2020-12-13 DIAGNOSIS — M5021 Other cervical disc displacement,  high cervical region: Secondary | ICD-10-CM | POA: Diagnosis not present

## 2020-12-13 DIAGNOSIS — M4805 Spinal stenosis, thoracolumbar region: Secondary | ICD-10-CM | POA: Diagnosis not present

## 2020-12-13 DIAGNOSIS — M48061 Spinal stenosis, lumbar region without neurogenic claudication: Secondary | ICD-10-CM | POA: Diagnosis present

## 2020-12-13 DIAGNOSIS — G062 Extradural and subdural abscess, unspecified: Secondary | ICD-10-CM | POA: Diagnosis not present

## 2020-12-13 DIAGNOSIS — Z951 Presence of aortocoronary bypass graft: Secondary | ICD-10-CM | POA: Diagnosis not present

## 2020-12-13 DIAGNOSIS — K5903 Drug induced constipation: Secondary | ICD-10-CM | POA: Diagnosis not present

## 2020-12-13 DIAGNOSIS — E663 Overweight: Secondary | ICD-10-CM | POA: Diagnosis present

## 2020-12-13 DIAGNOSIS — Z7984 Long term (current) use of oral hypoglycemic drugs: Secondary | ICD-10-CM | POA: Diagnosis not present

## 2020-12-13 DIAGNOSIS — M4626 Osteomyelitis of vertebra, lumbar region: Secondary | ICD-10-CM | POA: Diagnosis not present

## 2020-12-13 DIAGNOSIS — Z7982 Long term (current) use of aspirin: Secondary | ICD-10-CM | POA: Diagnosis not present

## 2020-12-13 DIAGNOSIS — Z981 Arthrodesis status: Secondary | ICD-10-CM | POA: Diagnosis not present

## 2020-12-13 DIAGNOSIS — M609 Myositis, unspecified: Secondary | ICD-10-CM | POA: Diagnosis not present

## 2020-12-13 DIAGNOSIS — M4807 Spinal stenosis, lumbosacral region: Secondary | ICD-10-CM | POA: Diagnosis not present

## 2020-12-13 DIAGNOSIS — E871 Hypo-osmolality and hyponatremia: Secondary | ICD-10-CM | POA: Diagnosis present

## 2020-12-13 DIAGNOSIS — M4624 Osteomyelitis of vertebra, thoracic region: Secondary | ICD-10-CM | POA: Diagnosis not present

## 2020-12-13 DIAGNOSIS — G928 Other toxic encephalopathy: Secondary | ICD-10-CM | POA: Diagnosis not present

## 2020-12-13 DIAGNOSIS — Z9889 Other specified postprocedural states: Secondary | ICD-10-CM | POA: Diagnosis not present

## 2020-12-13 DIAGNOSIS — G9589 Other specified diseases of spinal cord: Secondary | ICD-10-CM | POA: Diagnosis not present

## 2020-12-13 DIAGNOSIS — M4802 Spinal stenosis, cervical region: Secondary | ICD-10-CM | POA: Diagnosis not present

## 2020-12-13 DIAGNOSIS — M4620 Osteomyelitis of vertebra, site unspecified: Secondary | ICD-10-CM | POA: Diagnosis not present

## 2020-12-13 DIAGNOSIS — N281 Cyst of kidney, acquired: Secondary | ICD-10-CM | POA: Diagnosis not present

## 2020-12-13 DIAGNOSIS — F17210 Nicotine dependence, cigarettes, uncomplicated: Secondary | ICD-10-CM | POA: Diagnosis present

## 2020-12-13 LAB — BLOOD CULTURE ID PANEL (REFLEXED) - BCID2

## 2020-12-13 LAB — COMPREHENSIVE METABOLIC PANEL
ALT: 40 U/L (ref 0–44)
AST: 44 U/L — ABNORMAL HIGH (ref 15–41)
Albumin: 2.9 g/dL — ABNORMAL LOW (ref 3.5–5.0)
Alkaline Phosphatase: 65 U/L (ref 38–126)
Anion gap: 14 (ref 5–15)
BUN: 15 mg/dL (ref 6–20)
CO2: 17 mmol/L — ABNORMAL LOW (ref 22–32)
Calcium: 8.1 mg/dL — ABNORMAL LOW (ref 8.9–10.3)
Chloride: 96 mmol/L — ABNORMAL LOW (ref 98–111)
Creatinine, Ser: 1.09 mg/dL (ref 0.61–1.24)
GFR, Estimated: >60 mL/min (ref >60)
Glucose, Bld: 188 mg/dL — ABNORMAL HIGH (ref 70–99)
Potassium: 4 mmol/L (ref 3.5–5.1)
Sodium: 127 mmol/L — ABNORMAL LOW (ref 135–145)
Total Bilirubin: 1.9 mg/dL — ABNORMAL HIGH (ref 0.3–1.2)
Total Protein: 5.9 g/dL — ABNORMAL LOW (ref 6.5–8.1)

## 2020-12-13 LAB — CBC
HCT: 45.7 % (ref 39.0–52.0)
Hemoglobin: 15.2 g/dL (ref 13.0–17.0)
MCH: 30.5 pg (ref 26.0–34.0)
MCHC: 33.3 g/dL (ref 30.0–36.0)
MCV: 91.6 fL (ref 80.0–100.0)
Platelets: 178 10*3/uL (ref 150–400)
RBC: 4.99 MIL/uL (ref 4.22–5.81)
RDW: 14.2 % (ref 11.5–15.5)
WBC: 12 10*3/uL — ABNORMAL HIGH (ref 4.0–10.5)
nRBC: 0 % (ref 0.0–0.2)

## 2020-12-13 LAB — CBG MONITORING, ED
Glucose-Capillary: 173 mg/dL — ABNORMAL HIGH (ref 70–99)
Glucose-Capillary: 167 mg/dL — ABNORMAL HIGH (ref 70–99)
Glucose-Capillary: 139 mg/dL — ABNORMAL HIGH (ref 70–99)

## 2020-12-13 LAB — HEMOGLOBIN A1C
Hgb A1c MFr Bld: 9.1 % — ABNORMAL HIGH (ref 4.8–5.6)
Mean Plasma Glucose: 214 mg/dL

## 2020-12-13 LAB — PROTIME-INR
INR: 1.3 — ABNORMAL HIGH (ref 0.8–1.2)
Prothrombin Time: 15.7 seconds — ABNORMAL HIGH (ref 11.4–15.2)

## 2020-12-13 LAB — GLUCOSE, CAPILLARY: Glucose-Capillary: 144 mg/dL — ABNORMAL HIGH (ref 70–99)

## 2020-12-13 LAB — APTT: aPTT: 28 seconds (ref 24–36)

## 2020-12-13 LAB — SARS CORONAVIRUS 2 (TAT 6-24 HRS): SARS Coronavirus 2: NEGATIVE

## 2020-12-13 LAB — LACTIC ACID, PLASMA: Lactic Acid, Venous: 1.1 mmol/L (ref 0.5–1.9)

## 2020-12-13 LAB — HIV ANTIBODY (ROUTINE TESTING W REFLEX): HIV Screen 4th Generation wRfx: NONREACTIVE

## 2020-12-13 MED ORDER — ONDANSETRON HCL 4 MG/2ML IJ SOLN
4.0000 mg | Freq: Four times a day (QID) | INTRAMUSCULAR | Status: DC | PRN
  Administered 2020-12-13 – 2020-12-16 (×8): 4 mg via INTRAVENOUS
  Filled 2020-12-13 (×8): qty 2

## 2020-12-13 NOTE — ED Notes (Signed)
Breakfast orders placed 

## 2020-12-13 NOTE — ED Notes (Signed)
Attempted to call report x 1  

## 2020-12-13 NOTE — ED Notes (Signed)
Pt. Called, sent NT to room

## 2020-12-13 NOTE — ED Notes (Signed)
Patient called out. States he is in a lot of pain. Patient noted to be very uncomfortable and throwing up. Chole, RN Higher education careers adviser) Has been notified.

## 2020-12-13 NOTE — Progress Notes (Signed)
Staph aureus mec a 1/2 anaerobic PHARMACY - PHYSICIAN COMMUNICATION CRITICAL VALUE ALERT - BLOOD CULTURE IDENTIFICATION (BCID)  Lowell Makara is an 55 y.o. 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  Assessment:  blood cultures 1 of 2 growing MRSA in patient with known history of MRSA.  Name of physician (or Provider) Contacted: Timothy Opyd  Current antibiotics: ceftriaxone and vancomycin  Changes to prescribed antibiotics recommended:  Continue vancomycin but consider stopping ceftriaxone.  Recommendations accepted by provider, ceftriaxone discontinued.  Alphia Moh, PharmD, BCPS, BCCP Clinical Pharmacist  Please check AMION for all [facility] phone numbers After 10:00 PM, call Main Pharmacy [phone]

## 2020-12-13 NOTE — Progress Notes (Signed)
   12/13/20 1629  Vitals  Temp 97.7 F (36.5 C)  BP 131/84  MAP (mmHg) 100  BP Method Automatic  Pulse Rate (!) 112  Resp 16  MEWS COLOR  MEWS Score Color Yellow  Oxygen Therapy  SpO2 95 %  O2 Device Nasal Cannula  O2 Flow Rate (L/min) 2 L/min  MEWS Score  MEWS Temp 0  MEWS Systolic 0  MEWS Pulse 2  MEWS RR 0  MEWS LOC 0  MEWS Score 2    Paged provider regarding Yellow Mews.  Provider Okd earlier dose of pain medication.

## 2020-12-13 NOTE — Plan of Care (Signed)
°  Problem: Elimination: °Goal: Will not experience complications related to urinary retention °Outcome: Progressing °  °Problem: Pain Managment: °Goal: General experience of comfort will improve °Outcome: Not Progressing °  °

## 2020-12-13 NOTE — Progress Notes (Signed)
NEW ADMISSION NOTE New Admission Note:   Arrival Method: Stretcher Mental Orientation:  A&O X4 Telemetry: Box 8 Assessment: Completed Skin: Intact, see assessment for other IV: Right AC Pain: 9  1-10 scale Tubes: none Safety Measures: Safety Fall Prevention Plan has been given, discussed and signed Admission: Completed [facility] Orientation: Patient has been orientated to the room, unit and staff.  Family: Wife Bedside  Orders have been reviewed and implemented. Will continue to monitor the patient. Call light has been placed within reach and bed alarm has been activated.   Velia Meyer, RN

## 2020-12-13 NOTE — ED Notes (Signed)
Attempted to call report x2

## 2020-12-13 NOTE — Progress Notes (Signed)
PROGRESS NOTE    Louis [name]  YBO:[id number] DOB: [birth year] DOA: 12/12/2020 PCP: Pcp, No   Brief Narrative:  Louis [name] is a 55 y.o. male with medical history significant of CAD, hyperlipidemia, hypertension, diabetes, anxiety, depression who presents with left-sided back pain for the past 2 days. Patient states that he has had left-sided back pain for the past 2 days.  He was seen at [facility] and had a CT done there which showed renal cyst but no other explanation for his back pain.  States that he continues to have 10 out of 10 back pain which is difficult to describe, but is located in his lower back along the midline with intermittent radiation to his leg. He also reports some dry heaves.  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 He denies fevers, chills, chest pain, shortness of breath, abdominal pain, constipation, diarrhea. Admitted by [facility] for pain, antibiotic, and infection control given MRI remarkable for osteo/discitis at L3-S1 - Neurosurgery initially consulted but no surgical indications at this time.  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 -Pain moderately well controlled this morning, continue to titrate medications as needed    CAD Hyperlipidemia Hypertension -Hypertension reactive in the setting of above with associated pain tachycardia -Improving with pain control - Continue amlodipine, Lasix, losartan, metoprolol, Vascepa   Noninsulin-dependent diabetes type II - SSI, hypoglycemia protocol ongoing   Anxiety Depression - Continue home Xanax and venlafaxine   DVT prophylaxis:      Lovenox  Code Status:    Full  Family Communication: None present  Status is: Inpatient  Dispo: The patient is from: Home              Anticipated d/c is to: To be determined              Anticipated d/c date is: >72h              Patient currently not medically stable for discharge  Consultants:  Neurosurgery  Procedures:  None  Antimicrobials:  Vancomycin, cefepime 12/12/2020, ongoing  Subjective: No acute issues or events overnight, pain currently well controlled, patient having some nausea but no vomiting this morning in the setting of back pain but otherwise denies fevers chills constipation headache diarrhea shortness of breath or chest pain  Objective: Vitals:   12/13/20 0259 12/13/20 0515 12/13/20 0600 12/13/20 0715  BP: 139/88 (!) 143/88 (!) 152/98 (!) 158/95  Pulse: (!) 122 (!) 115 (!) 112 (!) 112  Resp: 20  20 (!) 21  Temp:      TempSrc:      SpO2: 95% 95% 94% 96%  Weight:      Height:        Intake/Output Summary (Last 24 hours) at 12/13/2020 0815 Last data filed at 12/13/2020 0355 Gross per 24 hour  Intake 4424.98 ml  Output --  Net 4424.98 ml   Filed Weights   12/12/20 1606  Weight: 95.3 kg    Examination:  General:  Pleasantly resting in bed, No acute distress. HEENT:  Normocephalic atraumatic.  Sclerae nonicteric, noninjected.  Extraocular movements intact bilaterally. Neck:  Without mass or deformity.  Trachea is midline. Lungs:  Clear to auscultate bilaterally without rhonchi, wheeze, or rales. Heart:  Regular rate and rhythm.  Without murmurs, rubs, or gallops. Abdomen:  Soft,  nontender, nondistended.  Without guarding or rebound. Back: Patient indicates midline tenderness from T10-S1 without paraspinal fluctuance or tenderness Extremities: Without cyanosis, clubbing, edema, or obvious deformity. Vascular:  Dorsalis pedis and posterior tibial pulses palpable bilaterally. Skin:  Warm and dry, no erythema, no ulcerations.  Data Reviewed: I have personally reviewed following labs and imaging studies  CBC: Recent Labs  Lab 12/12/20 2044 12/13/20 0046  WBC 11.8* 12.0*  HGB 16.4 15.2  HCT 48.4 45.7  MCV 89.6 91.6  PLT 224 178   Basic Metabolic Panel: Recent Labs  Lab 12/12/20 2044 12/13/20 0046  NA 128* 127*  K 4.2 4.0  CL 93* 96*  CO2 21* 17*  GLUCOSE 156* 188*  BUN 16 15  CREATININE 1.02 1.09  CALCIUM 8.5* 8.1*   GFR: Estimated Creatinine Clearance: 88.1 mL/min (by C-G formula based on SCr of 1.09 mg/dL). Liver Function Tests: Recent Labs  Lab 12/13/20 0046  AST 44*  ALT 40  ALKPHOS 65  BILITOT 1.9*  PROT 5.9*  ALBUMIN 2.9*   No results for input(s): LIPASE, AMYLASE in the last 168 hours. No results for input(s): AMMONIA in the last 168 hours. Coagulation Profile: Recent Labs  Lab 12/12/20 0025  INR 1.3*   Cardiac Enzymes: No results for input(s): CKTOTAL, CKMB, CKMBINDEX, TROPONINI in the last 168 hours. BNP (last 3 results) No results for input(s): PROBNP in the last 8760 hours. HbA1C: No results for input(s): HGBA1C in the last 72 hours. CBG: Recent Labs  Lab 12/13/20 0726  GLUCAP 173*   Lipid Profile: No results for input(s): CHOL, HDL, LDLCALC, TRIG, CHOLHDL, LDLDIRECT in the last 72 hours. Thyroid Function Tests: No results for input(s): TSH, T4TOTAL, FREET4, T3FREE, THYROIDAB in the last 72 hours. Anemia Panel: No results for input(s): VITAMINB12, FOLATE, FERRITIN, TIBC, IRON, RETICCTPCT in the last 72 hours. Sepsis Labs: Recent Labs  Lab 12/12/20 2221 12/13/20 0021  LATICACIDVEN 1.0 1.1    No results found  for this or any previous visit (from the past 240 hour(s)).       Radiology Studies: DG Chest 1 View  Result Date: 12/12/2020 CLINICAL DATA:  Sepsis.  Patient reports back and leg pain. EXAM: CHEST  1 VIEW COMPARISON:  Lung bases from abdominal CT yesterday. FINDINGS: Post median sternotomy and CABG. Normal heart size for technique. No focal airspace disease. No pleural effusion, pneumothorax, or pulmonary edema. No acute osseous abnormalities are seen. IMPRESSION: No acute abnormality. Electronically Signed   By: Narda RutherfordMelanie  Sanford M.D.   On: 12/12/2020 23:03   MR LUMBAR SPINE WO CONTRAST  Result Date: 12/12/2020 CLINICAL DATA:  Severe left-sided low back pain. EXAM: MRI LUMBAR SPINE WITHOUT CONTRAST TECHNIQUE: Multiplanar, multisequence MR imaging of the lumbar spine was performed. No intravenous contrast was administered. COMPARISON:  CT urogram 12/11/2020 FINDINGS: Segmentation: 5 non rib-bearing lumbar type vertebral bodies are present. The lowest fully formed vertebral body is L5. Alignment: No significant listhesis is present. There is some straightening of the normal cervical lordosis. Vertebrae: Edematous endplate changes are present along  the left side of the endplates at L3-4 and L4-5. Diffuse edematous changes are present at L5-S1. Marrow signal and vertebral body heights are otherwise normal. Conus medullaris and cauda equina: Conus extends to the L1-2 level. Conus and cauda equina appear normal. Paraspinal and other soft tissues: Edematous changes are present in the left paraspinous soft tissues, centered at L3-4 L4-5. There are changes into the left psoas muscle without a discrete abscess. Disc levels: L1-2: Negative. L2-3: Broad-based disc protrusion is asymmetric to the right. No significant stenosis. L3-4: Abnormal fluid signal is present in the disc space. Broad-based disc protrusion is present. Moderate left and mild right foraminal narrowing is present. L4-5: A broad-based disc  protrusion is present. Mild facet hypertrophy is noted bilaterally. Crowding of nerve roots is present. Abnormal fluid signal is present in the disc space, worse on the left. Moderate foraminal narrowing is present bilaterally. L5-S1: Broad-based disc protrusion is asymmetric to the right. Moderate foraminal narrowing is worse on the right. Fluid signal is present in the disc space. IMPRESSION: 1. Abnormal fluid signal in the disc spaces at L3-4, L4-5, and L5-S1 concerning for early disc osteomyelitis. 2. Edematous changes in the left paraspinous soft tissues and left psoas muscle compatible with acute myositis. No discrete abscess is present. 3. Moderate left and mild right foraminal stenosis at L3-4. 4. Moderate foraminal narrowing bilaterally at L4-5 and L5-S1. These results were called by telephone at the time of interpretation on 12/12/2020 at 8:34 pm to provider [facility] RAY , who verbally acknowledged these results. Electronically Signed   By: Marin Roberts M.D.   On: 12/12/2020 20:36   MR Abdomen W or Wo Contrast  Result Date: 12/12/2020 CLINICAL DATA:  Evaluate left kidney cyst EXAM: MRI ABDOMEN WITHOUT AND WITH CONTRAST TECHNIQUE: Multiplanar multisequence MR imaging of the abdomen was performed both before and after the administration of intravenous contrast. CONTRAST:  9.37mL GADAVIST GADOBUTROL 1 MMOL/ML IV SOLN COMPARISON:  CT AP 12/11/2020 FINDINGS: Lower chest: No acute findings. Hepatobiliary: No mass or other parenchymal abnormality identified. Pancreas: No mass, inflammatory changes, or other parenchymal abnormality identified. Spleen:  Within normal limits in size and appearance. Adrenals/Urinary Tract: Normal adrenal glands. Bilateral perinephric fat stranding is identified. No hydronephrosis identified bilaterally. Cyst arising off the inferior pole of the left kidney measures 4.2 by 3.3 cm, image 35/7. There is no internal septation or mural nodule identified. No enhancement  identified. Stomach/Bowel: Visualized portions within the abdomen are unremarkable. Vascular/Lymphatic: No pathologically enlarged lymph nodes identified. No abdominal aortic aneurysm demonstrated. Other:  None. Musculoskeletal: There are signs of discitis/osteomyelitis involving the L3-4, L4-5 and L5-S1 levels. Surrounding edema and enhancement extends into the left psoas muscle. No discrete fluid collection identified to suggest abscess. IMPRESSION: 1. 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 No suspicious kidney lesions identified at this time. 2. Findings compatible with discitis/osteomyelitis involving the L3-4, L4-5 and L5-S1 levels. Surrounding edema and enhancement extends into the left psoas muscle. No discrete fluid collection identified to suggest abscess. See lumbar spine report from today. Electronically Signed   By: Signa Kell M.D.   On: 12/12/2020 20:49     Scheduled Meds:  amLODipine  2.5 mg Oral q morning   aspirin EC  81 mg Oral q morning   atorvastatin  80 mg Oral QHS   enoxaparin (LOVENOX) injection  40 mg Subcutaneous Daily   furosemide  20 mg Oral q morning   icosapent Ethyl  2 g Oral  BID   insulin aspart  0-15 Units Subcutaneous TID WC   losartan  25 mg Oral q morning   metoprolol succinate  25 mg Oral QHS   pantoprazole  40 mg Oral Daily   sodium chloride flush  3 mL Intravenous Q12H   venlafaxine XR  75 mg Oral Q breakfast   Continuous Infusions:  cefTRIAXone (ROCEPHIN)  IV Stopped (12/13/20 0043)   vancomycin       LOS: 0 days   Time spent:  Azucena Fallen, DO [facility]  If 7PM-7AM, please contact night-coverage [email or web address]  12/13/2020, 8:15 AM

## 2020-12-14 ENCOUNTER — Other Ambulatory Visit (HOSPITAL_COMMUNITY): Payer: Federal, State, Local not specified - PPO

## 2020-12-14 DIAGNOSIS — R7881 Bacteremia: Secondary | ICD-10-CM

## 2020-12-14 DIAGNOSIS — B9562 Methicillin resistant Staphylococcus aureus infection as the cause of diseases classified elsewhere: Secondary | ICD-10-CM

## 2020-12-14 DIAGNOSIS — M869 Osteomyelitis, unspecified: Secondary | ICD-10-CM

## 2020-12-14 LAB — BASIC METABOLIC PANEL WITH GFR
Anion gap: 12 (ref 5–15)
BUN: 17 mg/dL (ref 6–20)
CO2: 22 mmol/L (ref 22–32)
Calcium: 8.5 mg/dL — ABNORMAL LOW (ref 8.9–10.3)
Chloride: 93 mmol/L — ABNORMAL LOW (ref 98–111)
Creatinine, Ser: 1.06 mg/dL (ref 0.61–1.24)
GFR, Estimated: >60 mL/min
Glucose, Bld: 173 mg/dL — ABNORMAL HIGH (ref 70–99)
Potassium: 4 mmol/L (ref 3.5–5.1)
Sodium: 127 mmol/L — ABNORMAL LOW (ref 135–145)

## 2020-12-14 LAB — GLUCOSE, CAPILLARY
Glucose-Capillary: 145 mg/dL — ABNORMAL HIGH (ref 70–99)
Glucose-Capillary: 196 mg/dL — ABNORMAL HIGH (ref 70–99)
Glucose-Capillary: 157 mg/dL — ABNORMAL HIGH (ref 70–99)
Glucose-Capillary: 157 mg/dL — ABNORMAL HIGH (ref 70–99)
Glucose-Capillary: 137 mg/dL — ABNORMAL HIGH (ref 70–99)

## 2020-12-14 LAB — CBC
HCT: 42.6 % (ref 39.0–52.0)
Hemoglobin: 14.3 g/dL (ref 13.0–17.0)
MCH: 30.1 pg (ref 26.0–34.0)
MCHC: 33.6 g/dL (ref 30.0–36.0)
MCV: 89.7 fL (ref 80.0–100.0)
Platelets: 175 10*3/uL (ref 150–400)
RBC: 4.75 MIL/uL (ref 4.22–5.81)
RDW: 14.6 % (ref 11.5–15.5)
WBC: 8.9 10*3/uL (ref 4.0–10.5)
nRBC: 0 % (ref 0.0–0.2)

## 2020-12-14 LAB — URINE CULTURE: Culture: <10000 — AB

## 2020-12-14 LAB — MRSA NEXT GEN BY PCR, NASAL: MRSA by PCR Next Gen: DETECTED — AB

## 2020-12-14 MED ORDER — ADULT MULTIVITAMIN W/MINERALS CH
1.0000 | ORAL_TABLET | Freq: Every day | ORAL | Status: DC
  Administered 2020-12-14 – 2020-12-25 (×10): 1 via ORAL
  Filled 2020-12-14 (×12): qty 1

## 2020-12-14 MED ORDER — ENSURE ENLIVE PO LIQD
237.0000 mL | Freq: Three times a day (TID) | ORAL | Status: DC
  Administered 2020-12-14 – 2020-12-23 (×11): 237 mL via ORAL

## 2020-12-14 MED ORDER — NICOTINE 21 MG/24HR TD PT24
21.0000 mg | MEDICATED_PATCH | Freq: Every day | TRANSDERMAL | Status: DC
  Administered 2020-12-14 – 2020-12-23 (×9): 21 mg via TRANSDERMAL
  Filled 2020-12-14 (×11): qty 1

## 2020-12-14 NOTE — Progress Notes (Signed)
PROGRESS NOTE    Louis [name]  NUU:[id number] DOB: 12-11-1965 DOA: 12/12/2020 PCP: Pcp, No   Brief Narrative:  Louis [name] is a 55 y.o. male with medical history significant of CAD, hyperlipidemia, hypertension, diabetes, anxiety, depression who presents with left-sided back pain for the past 2 days. Patient states that he has had left-sided back pain for the past 2 days.  He was seen at [facility] and had a CT done there which showed renal cyst but no other explanation for his back pain.  States that he continues to have 10 out of 10 back pain which is difficult to describe, but is located in his lower back along the midline with intermittent radiation to his leg. He also reports some dry heaves.  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 He denies fevers, chills, chest pain, shortness of breath, abdominal pain, constipation, diarrhea. Admitted by [facility] for pain, antibiotic, and infection control given MRI remarkable for osteo/discitis at L3-S1 - Neurosurgery initially consulted but no surgical indications at this time.  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 -MRI shows L3-S1 discitis osteomyelitis and myositis without notable abscess or drainable fluid -Continue vancomycin, ceftriaxone -Neurosurgery initially consulted, no acute need for  intervention at this time, continue medical management and antibiotics per their recommendations -Patient reports poor pain control but has not been asking for as needed narcotics, educated on need to request medications when appropriate   CAD Hyperlipidemia Hypertension -Hypertension reactive in the setting of above with associated pain tachycardia -Improving with pain control - Continue amlodipine, Lasix, losartan, metoprolol, Vascepa   Noninsulin-dependent diabetes type II, uncontrolled - SSI, hypoglycemia protocol ongoing - A1c 9.1, concurrent with profoundly uncontrolled diabetes. Lengthy discussion with patient about need for medication and dietary compliance, he is willing to work on diet and medication compliance to avoid insulin use, we discussed that he would have to profoundly improve his lifestyle choices   Anxiety Depression - Continue home Xanax and venlafaxine   DVT prophylaxis:      Lovenox  Code Status:    Full  Family Communication: None present  Status is: Inpatient  Dispo: The patient is from: Home              Anticipated d/c is to: To be determined              Anticipated d/c date is: >72h              Patient currently not medically stable for discharge  Consultants:  Neurosurgery  Procedures:  None  Antimicrobials:  Vancomycin, cefepime 12/12/2020, ongoing  Subjective: No acute issues or events overnight, pain somewhat poorly controlled but he has not been asking for pain medication which is already prescribed.  Blood cultures positive for MRSA on morning labs.  Patient otherwise denies nausea vomiting diarrhea constipation headache fevers chills shortness of breath.  Objective: Vitals:   12/13/20 1837  12/13/20 2134 12/14/20 0035 12/14/20 0514  BP: 138/84 (!) 141/79 121/75 115/73  Pulse: (!) 117 (!) 111 91 (!) 111  Resp: 18 16 16 16   Temp: 97.7 F (36.5 C) 98.5 F (36.9 C) 98 F (36.7 C) 98.9 F (37.2 C)  TempSrc: Oral  Oral   SpO2: 98% (!)  88% 97% 98%  Weight:  95.2 kg    Height:        Intake/Output Summary (Last 24 hours) at 12/14/2020 0733 Last data filed at 12/14/2020 0200 Gross per 24 hour  Intake 978.56 ml  Output 1325 ml  Net -346.44 ml    Filed Weights   12/12/20 1606 12/13/20 2134  Weight: 95.3 kg 95.2 kg    Examination:  General:  Pleasantly resting in bed, No acute distress. HEENT:  Normocephalic atraumatic.  Sclerae nonicteric, noninjected.  Extraocular movements intact bilaterally. Neck:  Without mass or deformity.  Trachea is midline. Lungs:  Clear to auscultate bilaterally without rhonchi, wheeze, or rales. Heart:  Regular rate and rhythm.  Without murmurs, rubs, or gallops. Abdomen:  Soft, nontender, nondistended.  Without guarding or rebound. Back: Patient indicates midline tenderness from T10-S1 without paraspinal fluctuance or tenderness Extremities: Without cyanosis, clubbing, edema, or obvious deformity. Vascular:  Dorsalis pedis and posterior tibial pulses palpable bilaterally. Skin:  Warm and dry, no erythema, no ulcerations.  Data Reviewed: I have personally reviewed following labs and imaging studies  CBC: Recent Labs  Lab 12/12/20 2044 12/13/20 0046 12/14/20 0347  WBC 11.8* 12.0* 8.9  HGB 16.4 15.2 14.3  HCT 48.4 45.7 42.6  MCV 89.6 91.6 89.7  PLT 224 178 175    Basic Metabolic Panel: Recent Labs  Lab 12/12/20 2044 12/13/20 0046 12/14/20 0347  NA 128* 127* 127*  K 4.2 4.0 4.0  CL 93* 96* 93*  CO2 21* 17* 22  GLUCOSE 156* 188* 173*  BUN 16 15 17   CREATININE 1.02 1.09 1.06  CALCIUM 8.5* 8.1* 8.5*    GFR: Estimated Creatinine Clearance: 90.6 mL/min (by C-G formula based on SCr of 1.06 mg/dL). Liver Function Tests: Recent Labs  Lab 12/13/20 0046  AST 44*  ALT 40  ALKPHOS 65  BILITOT 1.9*  PROT 5.9*  ALBUMIN 2.9*    No results for input(s): LIPASE, AMYLASE in the last 168 hours. No results for input(s): AMMONIA in the last 168 hours. Coagulation  Profile: Recent Labs  Lab 12/12/20 0025  INR 1.3*    Cardiac Enzymes: No results for input(s): CKTOTAL, CKMB, CKMBINDEX, TROPONINI in the last 168 hours. BNP (last 3 results) No results for input(s): PROBNP in the last 8760 hours. HbA1C: Recent Labs    12/13/20 0047  HGBA1C 9.1*   CBG: Recent Labs  Lab 12/13/20 1246 12/13/20 1543 12/13/20 1650 12/14/20 0107 12/14/20 0658  GLUCAP 167* 139* 144* 145* 196*    Lipid Profile: No results for input(s): CHOL, HDL, LDLCALC, TRIG, CHOLHDL, LDLDIRECT in the last 72 hours. Thyroid Function Tests: No results for input(s): TSH, T4TOTAL, FREET4, T3FREE, THYROIDAB in the last 72 hours. Anemia Panel: No results for input(s): VITAMINB12, FOLATE, FERRITIN, TIBC, IRON, RETICCTPCT in the last 72 hours. Sepsis Labs: Recent Labs  Lab 12/12/20 2221 12/13/20 0021  LATICACIDVEN 1.0 1.1     Recent Results (from the past 240 hour(s))  SARS CORONAVIRUS 2 (TAT 6-24 HRS) Nasopharyngeal Nasopharyngeal Swab     Status: None   Collection Time: 12/12/20 11:09 PM   Specimen: Nasopharyngeal Swab  Result Value Ref Range Status  SARS Coronavirus 2 NEGATIVE NEGATIVE Final    Comment: (NOTE) SARS-CoV-2 target nucleic acids are NOT DETECTED.  The SARS-CoV-2 RNA is generally detectable in upper and lower respiratory specimens during the acute phase of infection. Negative results do not preclude SARS-CoV-2 infection, do not rule out co-infections with other pathogens, and should not be used as the sole basis for treatment or other patient management decisions. Negative results must be combined with clinical observations, patient history, and epidemiological information. The expected result is Negative.  Fact Sheet for Patients: HairSlick.no  Fact Sheet for Healthcare Providers: quierodirigir.com  This test is not yet approved or cleared by the [place] FDA and  has been authorized for  detection and/or diagnosis of SARS-CoV-2 by FDA under an Emergency Use Authorization (EUA). This EUA will remain  in effect (meaning this test can be used) for the duration of the COVID-19 declaration under Se ction 564(b)(1) of the Act, 21 U.S.C. section 360bbb-3(b)(1), unless the authorization is terminated or revoked sooner.  Performed at [facility], [address], [place], [place] [address]   Blood culture (routine single)     Status: None (Preliminary result)   Collection Time: 12/13/20 12:25 AM   Specimen: BLOOD LEFT FOREARM  Result Value Ref Range Status   Specimen Description BLOOD LEFT FOREARM  Final   Special Requests   Final    BOTTLES DRAWN AEROBIC AND ANAEROBIC Blood Culture results may not be optimal due to an inadequate volume of blood received in culture bottles   Culture  Setup Time   Final    GRAM POSITIVE COCCI IN BOTH AEROBIC AND ANAEROBIC BOTTLES Organism ID to follow CRITICAL RESULT CALLED TO, READ BACK BY AND VERIFIED WITH: PHARMD LYDIA CHEN AT 2041 ON 12/13/20 BY KJ Performed at [facility], [address], [place], [place] [address]    Culture PENDING  Incomplete   Report Status PENDING  Incomplete  Blood Culture ID Panel (Reflexed)     Status: Abnormal   Collection Time: 12/13/20 12:25 AM  Result Value Ref Range Status   Enterococcus faecalis NOT DETECTED NOT DETECTED Final   Enterococcus Faecium NOT DETECTED NOT DETECTED Final   Listeria monocytogenes NOT DETECTED NOT DETECTED Final   Staphylococcus species DETECTED (A) NOT DETECTED Final    Comment: CRITICAL RESULT CALLED TO, READ BACK BY AND VERIFIED WITH: PHARMD LYDIA CHEN AT 2045 ON 12/13/20 BY KJ    Staphylococcus aureus (BCID) DETECTED (A) NOT DETECTED Final    Comment: Methicillin (oxacillin)-resistant Staphylococcus aureus (MRSA). MRSA is predictably resistant to beta-lactam antibiotics (except ceftaroline). Preferred therapy is vancomycin unless clinically contraindicated.  Patient requires contact precautions if  hospitalized. CRITICAL RESULT CALLED TO, READ BACK BY AND VERIFIED WITH: PHARMD LYDIA CHEN AT 2045 ON 12/13/20 BY KJ    Staphylococcus epidermidis NOT DETECTED NOT DETECTED Final   Staphylococcus lugdunensis NOT DETECTED NOT DETECTED Final   Streptococcus species NOT DETECTED NOT DETECTED Final   Streptococcus agalactiae NOT DETECTED NOT DETECTED Final   Streptococcus pneumoniae NOT DETECTED NOT DETECTED Final   Streptococcus pyogenes NOT DETECTED NOT DETECTED Final   A.calcoaceticus-baumannii NOT DETECTED NOT DETECTED Final   Bacteroides fragilis NOT DETECTED NOT DETECTED Final   Enterobacterales NOT DETECTED NOT DETECTED Final   Enterobacter cloacae complex NOT DETECTED NOT DETECTED Final   Escherichia coli NOT DETECTED NOT DETECTED Final   Klebsiella aerogenes NOT DETECTED NOT DETECTED Final   Klebsiella oxytoca NOT DETECTED NOT DETECTED Final   Klebsiella pneumoniae  NOT DETECTED NOT DETECTED Final   Proteus species NOT DETECTED NOT DETECTED Final   Salmonella species NOT DETECTED NOT DETECTED Final   Serratia marcescens NOT DETECTED NOT DETECTED Final   Haemophilus influenzae NOT DETECTED NOT DETECTED Final   Neisseria meningitidis NOT DETECTED NOT DETECTED Final   Pseudomonas aeruginosa NOT DETECTED NOT DETECTED Final   Stenotrophomonas maltophilia NOT DETECTED NOT DETECTED Final   Candida albicans NOT DETECTED NOT DETECTED Final   Candida auris NOT DETECTED NOT DETECTED Final   Candida glabrata NOT DETECTED NOT DETECTED Final   Candida krusei NOT DETECTED NOT DETECTED Final   Candida parapsilosis NOT DETECTED NOT DETECTED Final   Candida tropicalis NOT DETECTED NOT DETECTED Final   Cryptococcus neoformans/gattii NOT DETECTED NOT DETECTED Final   Meth resistant mecA/C and MREJ DETECTED (A) NOT DETECTED Final    Comment: CRITICAL RESULT CALLED TO, READ BACK BY AND VERIFIED WITH: PHARMD LYDIA CHEN AT 2045 ON 12/13/20 BY KJ Performed at  [facility], [address], [place], [place] [address]          Radiology Studies: DG Chest 1 View  Result Date: 12/12/2020 CLINICAL DATA:  Sepsis.  Patient reports back and leg pain. EXAM: CHEST  1 VIEW COMPARISON:  Lung bases from abdominal CT yesterday. FINDINGS: Post median sternotomy and CABG. Normal heart size for technique. No focal airspace disease. No pleural effusion, pneumothorax, or pulmonary edema. No acute osseous abnormalities are seen. IMPRESSION: No acute abnormality. Electronically Signed   By: Narda Rutherford M.D.   On: 12/12/2020 23:03   MR LUMBAR SPINE WO CONTRAST  Result Date: 12/12/2020 CLINICAL DATA:  Severe left-sided low back pain. EXAM: MRI LUMBAR SPINE WITHOUT CONTRAST TECHNIQUE: Multiplanar, multisequence MR imaging of the lumbar spine was performed. No intravenous contrast was administered. COMPARISON:  CT urogram 12/11/2020 FINDINGS: Segmentation: 5 non rib-bearing lumbar type vertebral bodies are present. The lowest fully formed vertebral body is L5. Alignment: No significant listhesis is present. There is some straightening of the normal cervical lordosis. Vertebrae: Edematous endplate changes are present along the left side of the endplates at L3-4 and L4-5. Diffuse edematous changes are present at L5-S1. Marrow signal and vertebral body heights are otherwise normal. Conus medullaris and cauda equina: Conus extends to the L1-2 level. Conus and cauda equina appear normal. Paraspinal and other soft tissues: Edematous changes are present in the left paraspinous soft tissues, centered at L3-4 L4-5. There are changes into the left psoas muscle without a discrete abscess. Disc levels: L1-2: Negative. L2-3: Broad-based disc protrusion is asymmetric to the right. No significant stenosis. L3-4: Abnormal fluid signal is present in the disc space. Broad-based disc protrusion is present. Moderate left and mild right foraminal narrowing is present. L4-5: A broad-based  disc protrusion is present. Mild facet hypertrophy is noted bilaterally. Crowding of nerve roots is present. Abnormal fluid signal is present in the disc space, worse on the left. Moderate foraminal narrowing is present bilaterally. L5-S1: Broad-based disc protrusion is asymmetric to the right. Moderate foraminal narrowing is worse on the right. Fluid signal is present in the disc space. IMPRESSION: 1. Abnormal fluid signal in the disc spaces at L3-4, L4-5, and L5-S1 concerning for early disc osteomyelitis. 2. Edematous changes in the left paraspinous soft tissues and left psoas muscle compatible with acute myositis. No discrete abscess is present. 3. Moderate left and mild right foraminal stenosis at L3-4. 4. Moderate foraminal narrowing bilaterally at L4-5 and L5-S1. These results were called by telephone  at the time of interpretation on 12/12/2020 at 8:34 pm to provider [facility]ANIEL[facility] RAY , who verbally acknowledged these results. Electronically Signed   By: Marin Robertshristopher  Mattern M.D.   On: 12/12/2020 20:36   MR Abdomen W or Wo Contrast  Result Date: 12/12/2020 CLINICAL DATA:  Evaluate left kidney cyst EXAM: MRI ABDOMEN WITHOUT AND WITH CONTRAST TECHNIQUE: Multiplanar multisequence MR imaging of the abdomen was performed both before and after the administration of intravenous contrast. CONTRAST:  9.725mL GADAVIST GADOBUTROL 1 MMOL/ML IV SOLN COMPARISON:  CT AP 12/11/2020 FINDINGS: Lower chest: No acute findings. Hepatobiliary: No mass or other parenchymal abnormality identified. Pancreas: No mass, inflammatory changes, or other parenchymal abnormality identified. Spleen:  Within normal limits in size and appearance. Adrenals/Urinary Tract: Normal adrenal glands. Bilateral perinephric fat stranding is identified. No hydronephrosis identified bilaterally. Cyst arising off the inferior pole of the left kidney measures 4.2 by 3.3 cm, image 35/7. There is no internal septation or mural nodule identified. No enhancement  identified. Stomach/Bowel: Visualized portions within the abdomen are unremarkable. Vascular/Lymphatic: No pathologically enlarged lymph nodes identified. No abdominal aortic aneurysm demonstrated. Other:  None. Musculoskeletal: There are signs of discitis/osteomyelitis involving the L3-4, L4-5 and L5-S1 levels. Surrounding edema and enhancement extends into the left psoas muscle. No discrete fluid collection identified to suggest abscess. IMPRESSION: 1. 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 No suspicious kidney lesions identified at this time. 2. Findings compatible with discitis/osteomyelitis involving the L3-4, L4-5 and L5-S1 levels. Surrounding edema and enhancement extends into the left psoas muscle. No discrete fluid collection identified to suggest abscess. See lumbar spine report from today. Electronically Signed   By: Signa Kellaylor  Stroud M.D.   On: 12/12/2020 20:49     Scheduled Meds:  amLODipine  2.5 mg Oral q morning   aspirin EC  81 mg Oral q morning   atorvastatin  80 mg Oral QHS   enoxaparin (LOVENOX) injection  40 mg Subcutaneous Daily   furosemide  20 mg Oral q morning   icosapent Ethyl  2 g Oral BID   insulin aspart  0-15 Units Subcutaneous TID WC   losartan  25 mg Oral q morning   metoprolol succinate  25 mg Oral QHS   pantoprazole  40 mg Oral Daily   sodium chloride flush  3 mL Intravenous Q12H   venlafaxine XR  75 mg Oral Q breakfast   Continuous Infusions:  vancomycin 1,250 mg (12/13/20 2322)     LOS: 1 day   Time spent: 45min  Azucena FallenWilliam C Peretz Thieme, DO [facility]  If 7PM-7AM, please contact night-coverage [email or web address]  12/14/2020, 7:33 AM

## 2020-12-14 NOTE — Progress Notes (Addendum)
Initial Nutrition Assessment  DOCUMENTATION CODES:   Not applicable  INTERVENTION:   -Ensure Enlive po TID, each supplement provides 350 kcal and 20 grams of protein  -Hormel Shake TID, each supplement provides 520 kcals and 22 grams protein -Magic cup TID with meals, each supplement provides 290 kcal and 9 grams of protein  -MVI with minerals daily  NUTRITION DIAGNOSIS:   Inadequate oral intake related to poor appetite as evidenced by per patient/family report, meal completion < 50%.  GOAL:   Patient will meet greater than or equal to 90% of their needs  MONITOR:   PO intake, Supplement acceptance, Labs, Weight trends, Skin, I & O's  REASON FOR ASSESSMENT:   Malnutrition Screening Tool    ASSESSMENT:   Louis [name] is a 55 y.o. male with medical history significant of CAD, hyperlipidemia, hypertension, diabetes, anxiety, depression who presents with left-sided back pain for the past 2 days.  Pt admitted with sepsis secondary to spinal osteomyelitis with associated discitis and myositis without abscess and MRSA bacteremia.    Reviewed I/O's: -346 ml x 24 hours and +4.1 L since admission  UOP: 1.3 L x 24 hours  Pt reports he is tired and in pain at time of visit and politely deferred questioning to his wife at bedside. Per wife, pt with minimal intake over the past 5 days, secondary to pain. Prior to acute illness, pt never ate breakfast and typically ate chips for lunch and a dinner of a meat, starch, and vegetable.   Per pt wife, pt has been unable to eat since admission secondary to pain. He is drinking well and noted pt consumed about 90% of Ensure supplement. Documented meal completions 0-50%.   Per wife, pt has not lost weight.   Discussed importance of good meal and supplement intake to promote healing. Pt and wife amenable to supplements.   Pt with poor oral intake and would benefit from nutrient dense supplement. One Ensure Enlive supplement provides 350 kcals, 20  grams protein, and 44-45 grams of carbohydrate vs one Glucerna shake supplement, which provides 220 kcals, 10 grams of protein, and 26 grams of carbohydrate. Given pt's hx of DM, RD will reassess adequacy of PO intake, CBGS, and adjust supplement regimen as appropriate at follow-up.    Medications reviewed and include lasix.  Lab Results  Component Value Date   HGBA1C 9.1 (H) 12/13/2020   PTA DM medications are 5 mg glipizide daily.   Labs reviewed: CBGS: 139-196 (inpatient orders for glycemic control are 0-15 units insulin aspart TID with meals).    NUTRITION - FOCUSED PHYSICAL EXAM:  Flowsheet Row Most Recent Value  Orbital Region No depletion  Upper Arm Region No depletion  Thoracic and Lumbar Region No depletion  Buccal Region No depletion  Temple Region No depletion  Clavicle Bone Region No depletion  Clavicle and Acromion Bone Region No depletion  Scapular Bone Region No depletion  Dorsal Hand No depletion  Patellar Region No depletion  Anterior Thigh Region No depletion  Posterior Calf Region No depletion  Edema (RD Assessment) None  Hair Reviewed  Eyes Reviewed  Mouth Reviewed  Skin Reviewed  Nails Reviewed       Diet Order:   Diet Order             Diet heart healthy/carb modified Room service appropriate? Yes; Fluid consistency: Thin  Diet effective now                   EDUCATION  NEEDS:   Education needs have been addressed  Skin:  Skin Assessment: Reviewed RN Assessment  Last BM:  Unknown  Height:   Ht Readings from Last 1 Encounters:  12/12/20 5\' 11"  (1.803 m)    Weight:   Wt Readings from Last 1 Encounters:  12/14/20 94.1 kg    Ideal Body Weight:  78.2 kg  BMI:  Body mass index is 28.93 kg/m.  Estimated Nutritional Needs:   Kcal:  12/16/20  Protein:  115-130 grams  Fluid:  > 2 L    [id number], RD, LDN, CDCES Registered Dietitian II Certified Diabetes Care and Education Specialist Please refer to [facility] for RD and/or  RD on-call/weekend/after hours pager

## 2020-12-14 NOTE — Consult Note (Signed)
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 At this time no other obvious metastatic focus of infection Ortho spine had discussed case with primary team and no surgical intervention  Given this is Louis [name], no indication to sample vertebral body for targetted therapy  Duration of tx would be at least 8 weeks  Plan: Continue vanc; will review daptomycin coverage with his insurance as less toxic TTE; if he clears bacteremia quickly (<3 days) and sepsis defervesed quickly, will defer tee  Repeat bcx tomorrow 2 sets Do not need vertebral bone biopsy as having Louis [name]      I spent 60 minute reviewing data/chart, and coordinating care and >50% direct face to face time providing counseling/discussing diagnostics/treatment plan with patient  ------------------------------------------------ Principal Problem:   Louis bacteremia Active Problems:   CAD (coronary artery disease)   Dyslipidemia   HTN (hypertension)   Type 2 diabetes mellitus (HCC)   Osteomyelitis (HCC)   Tachycardia    HPI: Louis [name] is a 55 y.o. male hx cad s/p cabg several years past, htn/hlp, dm2, recurrent Louis ssi (an episode within the past 3 months given ?tedezolid), admitted  6/19 with 2 days acute onset lower back pain found to have Louis bacteremia and lumbar spine vertebral OM (L3-4, L4-5 and L5-S1 levels) with left psoas myositis but no discrete fluid collection/phlegmon  Hx via patient/chart/patient's wife  Patient was fine/at his normal baseline health until 2 days prior to admission when he developed acute onset lower back pain. No f/c, other joint pain, myalgia, rash, headache, chest pain, cough, sob  He went to [facility]. Had a ct of his back per his report and was sent home  His sx worse so presented to [place] where mri showed lumbar spine suggestion of om along with left psoas myositis. His bcx ultimately returned with Louis as well  He has recurrent Louis ssi. About 3 months ago had an episode on his face; given ?tedezolid.   No hardware in his body otherwise  Pain rather severe. No LE or bowel/bladder neurologic deficit    Fam hx: Dm2, cad  Social History   Tobacco Use   Smoking status: Every Day    Packs/day: 2.00    Pack years: 0.00    Types: Cigarettes    Start date: 06/28/1995   Smokeless tobacco: Never  Vaping Use   Vaping Use: Never used  Substance Use Topics   Alcohol use: Not Currently   Drug use: Never    No Known Allergies  Review of Systems: ROS All Other ROS was negative, except mentioned above   Past Medical History:  Diagnosis Date   CAD (coronary artery  disease)    DM (diabetes mellitus) (HCC)    Hyperlipidemia    Hypertension        Scheduled Meds:  amLODipine  2.5 mg Oral q morning   aspirin EC  81 mg Oral q morning   atorvastatin  80 mg Oral QHS   enoxaparin (LOVENOX) injection  40 mg Subcutaneous Daily   furosemide  20 mg Oral q morning   icosapent Ethyl  2 g Oral BID   insulin aspart  0-15 Units Subcutaneous TID WC   losartan  25 mg Oral q morning   metoprolol succinate  25 mg Oral QHS   pantoprazole  40 mg Oral Daily   sodium chloride flush  3 mL Intravenous Q12H    venlafaxine XR  75 mg Oral Q breakfast   Continuous Infusions:  vancomycin 1,250 mg (12/13/20 2322)   PRN Meds:.acetaminophen **OR** acetaminophen, ALPRAZolam, bisacodyl, diclofenac Sodium, gabapentin, HYDROcodone-acetaminophen, HYDROmorphone (DILAUDID) injection, lidocaine, ondansetron (ZOFRAN) IV, polyethylene glycol, tiZANidine   OBJECTIVE: Blood pressure 115/73, pulse (!) 111, temperature 98.9 F (37.2 C), resp. rate 16, height 5\' 11"  (1.803 m), weight 94.1 kg, SpO2 98 %.  Physical Exam General/constitutional: moderate distress complaining of pain lower back, pleasant HEENT: Normocephalic, PER, Conj Clear, EOMI, Oropharynx clear Neck supple CV: rrr no mrg Lungs: clear to auscultation, normal respiratory effort Abd: Soft, Nontender Ext: no edema Skin: No Rash Neuro: nonfocal MSK: no peripheral joint swelling/tenderness/warmth; back spines tender midline lower lumbar area Psych: alert/oriented   Lab Results Lab Results  Component Value Date   WBC 8.9 12/14/2020   HGB 14.3 12/14/2020   HCT 42.6 12/14/2020   MCV 89.7 12/14/2020   PLT 175 12/14/2020    Lab Results  Component Value Date   CREATININE 1.06 12/14/2020   BUN 17 12/14/2020   NA 127 (L) 12/14/2020   K 4.0 12/14/2020   CL 93 (L) 12/14/2020   CO2 22 12/14/2020    Lab Results  Component Value Date   ALT 40 12/13/2020   AST 44 (H) 12/13/2020   ALKPHOS 65 12/13/2020   BILITOT 1.9 (H) 12/13/2020      Microbiology: Recent Results (from the past 240 hour(s))  SARS CORONAVIRUS 2 (TAT 6-24 HRS) Nasopharyngeal Nasopharyngeal Swab     Status: None   Collection Time: 12/12/20 11:09 PM   Specimen: Nasopharyngeal Swab  Result Value Ref Range Status   SARS Coronavirus 2 NEGATIVE NEGATIVE Final    Comment: (NOTE) SARS-CoV-2 target nucleic acids are NOT DETECTED.  The SARS-CoV-2 RNA is generally detectable in upper and lower respiratory specimens during the acute phase of infection. Negative results do not  preclude SARS-CoV-2 infection, do not rule out co-infections with other pathogens, and should not be used as the sole basis for treatment or other patient management decisions. Negative results must be combined with clinical observations, patient history, and epidemiological information. The expected result is Negative.  Fact Sheet for Patients: 12/14/20  Fact Sheet for Healthcare Providers: HairSlick.no  This test is not yet approved or cleared by the quierodirigir.com FDA and  has been authorized for detection and/or diagnosis of SARS-CoV-2 by FDA under an Emergency Use Authorization (EUA). This EUA will remain  in effect (meaning this test can be used) for the duration of the COVID-19 declaration under Se ction 564(b)(1) of the Act, 21 U.S.C. section 360bbb-3(b)(1), unless the authorization is terminated or revoked sooner.  Performed at [facility], [address] [place], [place]   Blood culture (  routine single)     Status: None (Preliminary result)   Collection Time: 12/13/20 12:25 AM   Specimen: BLOOD LEFT FOREARM  Result Value Ref Range Status   Specimen Description BLOOD LEFT FOREARM  Final   Special Requests   Final    BOTTLES DRAWN AEROBIC AND ANAEROBIC Blood Culture results may not be optimal due to an inadequate volume of blood received in culture bottles   Culture  Setup Time   Final    GRAM POSITIVE COCCI IN BOTH AEROBIC AND ANAEROBIC BOTTLES Organism ID to follow CRITICAL RESULT CALLED TO, READ BACK BY AND VERIFIED WITH: PHARMD LYDIA CHEN AT 2041 ON 12/13/20 BY KJ Performed at [facility], [address], [place], [place] [address]    Culture GRAM POSITIVE COCCI  Final   Report Status PENDING  Incomplete  Blood Culture ID Panel (Reflexed)     Status: Abnormal   Collection Time: 12/13/20 12:25 AM  Result Value Ref Range Status   Enterococcus faecalis NOT DETECTED NOT DETECTED  Final   Enterococcus Faecium NOT DETECTED NOT DETECTED Final   Listeria monocytogenes NOT DETECTED NOT DETECTED Final   Staphylococcus species DETECTED (A) NOT DETECTED Final    Comment: CRITICAL RESULT CALLED TO, READ BACK BY AND VERIFIED WITH: PHARMD LYDIA CHEN AT 2045 ON 12/13/20 BY KJ    Staphylococcus aureus (BCID) DETECTED (A) NOT DETECTED Final    Comment: Methicillin (oxacillin)-resistant Staphylococcus aureus (Louis). Louis is predictably resistant to beta-lactam antibiotics (except ceftaroline). Preferred therapy is vancomycin unless clinically contraindicated. Patient requires contact precautions if  hospitalized. CRITICAL RESULT CALLED TO, READ BACK BY AND VERIFIED WITH: PHARMD LYDIA CHEN AT 2045 ON 12/13/20 BY KJ    Staphylococcus epidermidis NOT DETECTED NOT DETECTED Final   Staphylococcus lugdunensis NOT DETECTED NOT DETECTED Final   Streptococcus species NOT DETECTED NOT DETECTED Final   Streptococcus agalactiae NOT DETECTED NOT DETECTED Final   Streptococcus pneumoniae NOT DETECTED NOT DETECTED Final   Streptococcus pyogenes NOT DETECTED NOT DETECTED Final   A.calcoaceticus-baumannii NOT DETECTED NOT DETECTED Final   Bacteroides fragilis NOT DETECTED NOT DETECTED Final   Enterobacterales NOT DETECTED NOT DETECTED Final   Enterobacter cloacae complex NOT DETECTED NOT DETECTED Final   Escherichia coli NOT DETECTED NOT DETECTED Final   Klebsiella aerogenes NOT DETECTED NOT DETECTED Final   Klebsiella oxytoca NOT DETECTED NOT DETECTED Final   Klebsiella pneumoniae NOT DETECTED NOT DETECTED Final   Proteus species NOT DETECTED NOT DETECTED Final   Salmonella species NOT DETECTED NOT DETECTED Final   Serratia marcescens NOT DETECTED NOT DETECTED Final   Haemophilus influenzae NOT DETECTED NOT DETECTED Final   Neisseria meningitidis NOT DETECTED NOT DETECTED Final   Pseudomonas aeruginosa NOT DETECTED NOT DETECTED Final   Stenotrophomonas maltophilia NOT DETECTED NOT DETECTED  Final   Candida albicans NOT DETECTED NOT DETECTED Final   Candida auris NOT DETECTED NOT DETECTED Final   Candida glabrata NOT DETECTED NOT DETECTED Final   Candida krusei NOT DETECTED NOT DETECTED Final   Candida parapsilosis NOT DETECTED NOT DETECTED Final   Candida tropicalis NOT DETECTED NOT DETECTED Final   Cryptococcus neoformans/gattii NOT DETECTED NOT DETECTED Final   Meth resistant mecA/C and MREJ DETECTED (A) NOT DETECTED Final    Comment: CRITICAL RESULT CALLED TO, READ BACK BY AND VERIFIED WITH: PHARMD LYDIA CHEN AT 2045 ON 12/13/20 BY KJ Performed at [facility], [address] [place], [place] [address]      Serology:  Imaging: If present, new imagings (plain films, ct scans, and mri) have been personally visualized and interpreted; radiology reports have been reviewed. Decision making incorporated into the Impression / Recommendations.  6/19 mri abd/l-spine 1. 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 No suspicious kidney lesions identified at this time. 2. Findings compatible with discitis/osteomyelitis involving the L3-4, L4-5 and L5-S1 levels. Surrounding edema and enhancement extends into the left psoas muscle. No discrete fluid collection identified to suggest abscess. See lumbar spine report from today.  Raymondo Band, MD [facility] [phone] pager    12/14/2020, 9:27 AM

## 2020-12-14 NOTE — TOC Initial Note (Addendum)
Transition of Care [facility]) - Initial/Assessment Note    Patient Details  Name: Louis [name] MRN: [id number] Date of Birth: [birth year]  [facility] CM/SW Contact:    Lawerance Sabal, RN Phone Number: 12/14/2020, 11:47 AM  Clinical Narrative:          Sherron Monday w patient and wife at bedside. Anticipate DC to home w IV Abx (when stable). Discussed basics of home infusions, and wife able to support.  Notified Pam w [facility] to follow for pharmacological needs.  Referral for [facility] services made to: -[facility] for [facility] services, they are reviewing BCBS policy and staffing in his area. Declined- out of service Area -[facility] accepted          Expected Discharge Plan: Home w Home Health Services Barriers to Discharge: Continued Medical Work up   Patient Goals and CMS Choice Patient states their goals for this hospitalization and ongoing recovery are:: return home at DC   Choice offered to / list presented to : NA  Expected Discharge Plan and Services Expected Discharge Plan: Home w Home Health Services In-house Referral: NA Discharge Planning Services: CM Consult Post Acute Care Choice: Home Health Living arrangements for the past 2 months: Single Family Home                                      Prior Living Arrangements/Services Living arrangements for the past 2 months: Single Family Home Lives with:: Spouse                   Activities of Daily Living Home Assistive Devices/Equipment: None ADL Screening (condition at time of admission) Patient's cognitive ability adequate to safely complete daily activities?: Yes Is the patient deaf or have difficulty hearing?: No Does the patient have difficulty seeing, even when wearing glasses/contacts?: No Does the patient have difficulty concentrating, remembering, or making decisions?: No Patient able to express need for assistance with ADLs?: Yes Does the patient have difficulty dressing or bathing?:  No Independently performs ADLs?: Yes (appropriate for developmental age) Does the patient have difficulty walking or climbing stairs?: No Weakness of Legs: Both Weakness of Arms/Hands: None  Permission Sought/Granted                  Emotional Assessment              Admission diagnosis:  Osteomyelitis (HCC) [M86.9] Sepsis (HCC) [A41.9] Patient Active Problem List   Diagnosis Date Noted   MRSA bacteremia 12/14/2020   Osteomyelitis (HCC) 12/12/2020   Tachycardia 12/12/2020   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

## 2020-12-15 ENCOUNTER — Inpatient Hospital Stay (HOSPITAL_COMMUNITY): Payer: Federal, State, Local not specified - PPO

## 2020-12-15 DIAGNOSIS — R7881 Bacteremia: Secondary | ICD-10-CM

## 2020-12-15 DIAGNOSIS — E663 Overweight: Secondary | ICD-10-CM | POA: Diagnosis present

## 2020-12-15 DIAGNOSIS — M545 Low back pain, unspecified: Secondary | ICD-10-CM

## 2020-12-15 DIAGNOSIS — K5903 Drug induced constipation: Secondary | ICD-10-CM

## 2020-12-15 DIAGNOSIS — M549 Dorsalgia, unspecified: Secondary | ICD-10-CM | POA: Diagnosis present

## 2020-12-15 DIAGNOSIS — K5900 Constipation, unspecified: Secondary | ICD-10-CM | POA: Clinically undetermined

## 2020-12-15 DIAGNOSIS — A4102 Sepsis due to Methicillin resistant Staphylococcus aureus: Principal | ICD-10-CM

## 2020-12-15 LAB — BASIC METABOLIC PANEL
Anion gap: 9 (ref 5–15)
BUN: 20 mg/dL (ref 6–20)
CO2: 24 mmol/L (ref 22–32)
Calcium: 8.6 mg/dL — ABNORMAL LOW (ref 8.9–10.3)
Chloride: 95 mmol/L — ABNORMAL LOW (ref 98–111)
Creatinine, Ser: 0.77 mg/dL (ref 0.61–1.24)
GFR, Estimated: >60 mL/min (ref >60)
Glucose, Bld: 205 mg/dL — ABNORMAL HIGH (ref 70–99)
Potassium: 4.1 mmol/L (ref 3.5–5.1)
Sodium: 128 mmol/L — ABNORMAL LOW (ref 135–145)

## 2020-12-15 LAB — ECHOCARDIOGRAM COMPLETE
AR max vel: 4.28 cm2
AV Area VTI: 3.5 cm2
AV Area mean vel: 3.91 cm2
AV Mean grad: 5 mmHg
AV Peak grad: 8.9 mmHg
Ao pk vel: 1.49 m/s
Area-P 1/2: 4.6 cm2
Height: 71 in
S' Lateral: 3 cm
Weight: 3312.19 oz

## 2020-12-15 LAB — GLUCOSE, CAPILLARY
Glucose-Capillary: 224 mg/dL — ABNORMAL HIGH (ref 70–99)
Glucose-Capillary: 193 mg/dL — ABNORMAL HIGH (ref 70–99)
Glucose-Capillary: 187 mg/dL — ABNORMAL HIGH (ref 70–99)
Glucose-Capillary: 247 mg/dL — ABNORMAL HIGH (ref 70–99)

## 2020-12-15 LAB — CBC
HCT: 41.9 % (ref 39.0–52.0)
Hemoglobin: 14.1 g/dL (ref 13.0–17.0)
MCH: 29.7 pg (ref 26.0–34.0)
MCHC: 33.7 g/dL (ref 30.0–36.0)
MCV: 88.4 fL (ref 80.0–100.0)
Platelets: 179 10*3/uL (ref 150–400)
RBC: 4.74 MIL/uL (ref 4.22–5.81)
RDW: 14.6 % (ref 11.5–15.5)
WBC: 8.4 10*3/uL (ref 4.0–10.5)
nRBC: 0 % (ref 0.0–0.2)

## 2020-12-15 LAB — CULTURE, BLOOD (SINGLE)

## 2020-12-15 MED ORDER — PERFLUTREN LIPID MICROSPHERE
1.0000 mL | INTRAVENOUS | Status: AC | PRN
  Administered 2020-12-15: 6 mL via INTRAVENOUS
  Filled 2020-12-15: qty 10

## 2020-12-15 MED ORDER — KETOROLAC TROMETHAMINE 15 MG/ML IJ SOLN
15.0000 mg | Freq: Four times a day (QID) | INTRAMUSCULAR | Status: DC | PRN
  Administered 2020-12-15 – 2020-12-16 (×3): 15 mg via INTRAVENOUS
  Filled 2020-12-15 (×4): qty 1

## 2020-12-15 MED ORDER — FENTANYL 25 MCG/HR TD PT72
1.0000 | MEDICATED_PATCH | TRANSDERMAL | Status: DC
Start: 2020-12-15 — End: 2020-12-16
  Administered 2020-12-15: 1 via TRANSDERMAL
  Filled 2020-12-15: qty 1

## 2020-12-15 MED ORDER — SODIUM CHLORIDE 0.9 % IV SOLN
8.0000 mg/kg | Freq: Every day | INTRAVENOUS | Status: DC
  Administered 2020-12-15 – 2020-12-26 (×12): 750 mg via INTRAVENOUS
  Filled 2020-12-15 (×12): qty 15

## 2020-12-15 MED ORDER — LACTULOSE 10 GM/15ML PO SOLN
10.0000 g | Freq: Every day | ORAL | Status: DC
  Administered 2020-12-15 – 2020-12-25 (×8): 10 g via ORAL
  Filled 2020-12-15 (×11): qty 15

## 2020-12-15 MED ORDER — ATORVASTATIN CALCIUM 40 MG PO TABS
40.0000 mg | ORAL_TABLET | Freq: Every day | ORAL | Status: DC
  Administered 2020-12-15 – 2020-12-25 (×11): 40 mg via ORAL
  Filled 2020-12-15 (×11): qty 1

## 2020-12-15 NOTE — Progress Notes (Signed)
Pt c/o pain  prn PO meds given, pt says he threw them up. IV dilaudid given with some relief. Pt. Resting in chair quietly no needs voiced at this time. BP 93/76   Pulse 95   Temp (!) 97.5 F (36.4 C) (Oral)   Resp 20   Ht 5\' 11"  (1.803 m)   Wt 94.1 kg   SpO2 96%   BMI 28.93 kg/m

## 2020-12-15 NOTE — Plan of Care (Signed)
  Problem: Activity: Goal: Risk for activity intolerance will decrease Outcome: Progressing   

## 2020-12-15 NOTE — Progress Notes (Addendum)
Inpatient Diabetes Program Recommendations  AACE/ADA: New Consensus Statement on Inpatient Glycemic Control   Target Ranges:  Prepandial:   less than 140 mg/dL      Peak postprandial:   less than 180 mg/dL (1-2 hours)      Critically ill patients:  140 - 180 mg/dL   Results for Louis [name], Louis [name] (MRN [id number]) as of 12/15/2020 10:55  Ref. Range 12/14/2020 06:58 12/14/2020 11:56 12/14/2020 16:52 12/14/2020 20:50 12/15/2020 06:48  Glucose-Capillary Latest Ref Range: 70 - 99 mg/dL 409 (H)  Novolog 3 units 157 (H)  Novolog 3 units 157 (H)  Novolog 3 units 137 (H)  224 (H)  Novolog 5 units   Results for Louis [name], Louis [name] (MRN [id number]) as of 12/15/2020 10:55  Ref. Range 12/13/2020 00:47  Hemoglobin A1C Latest Ref Range: 4.8 - 5.6 % 9.1 (H)   Review of Glycemic Control  Diabetes history: DM2 Outpatient Diabetes medications: Farxiga 10 QAM, Glipizide 5 mg BID, Metformin 1000 mg QAM, Metformin 500 mg QPM Current orders for Inpatient glycemic control: Novolog 0-15 units TID with meals  Inpatient Diabetes Program Recommendations:    Insulin: Please consider ordering Lantus 5 units Q24H.  HbgA1C:  A1C 9.1% on 12/13/20 indicating an average glucose of 214 mg/dl over the past 2-3 months.  Addendum 12/15/20@11 :35-Spoke with patient and his wife at bedside. Patient very drowsy and sleeping through most of conversation. Patient's wife states that she prepares all patient's medications and he takes them consistently. She verified that patient is taking Farxiga 10 QAM, Glipizide 5 mg BID, Metformin 1000 mg QAM, Metformin 500 mg QPM. Patient is not checking glucose at home but does have a glucometer and testing supplies. She reports that patient's last A1C was in upper 8% range. Discussed current A1C of 9.1% indicating an average glucose of 214 mg/dl. Discussed glucose and A1C goals. Patient's wife reports that she feels his diet is the reason for uncontrolled DM. She states that patient eats a lot of sweets, will eat a  quart of ice cream at one time, but drinks diet sodas.  She notes that patient has been to DM nutrition education a long time ago and feels it may be beneficial for inpatient RD to see patient when he is more alert regarding Carb Modified diet education. Placed order for RD consult (to see when patient is more alert). Stressed importance of getting DM under better control. Informed patient's wife that diabetes coordinator will attempt to follow up with patient prior to discharge to discuss DM control.  Patient's wife verbalized understanding of information discussed and states she has no questions at this time.  Thanks, Orlando Penner, RN, MSN, CDE Diabetes Coordinator Inpatient Diabetes Program [phone] (Team Pager from 8am to 5pm)

## 2020-12-15 NOTE — Plan of Care (Signed)
Pain management remains an issue for pt.  Dilaudid 1mg  IV given q2h PRN with little relief noted.  Voltaren gel applied to back and pt was able to rest lying on his left side with a pillow in between legs for about 3 hours.  He remains nauseous and has dry heaves but little emesis noted despite Zofran 4mg  IV q6h PRN.  He has a poor appetite, but is able to drink large amounts of clear liquids.  IV antibiotics continue.  BSN RN CMSRN    Problem: Activity: Goal: Risk for activity intolerance will decrease Outcome: Not Progressing   Problem: Pain Managment: Goal: General experience of comfort will improve Outcome: Not Progressing

## 2020-12-15 NOTE — Progress Notes (Signed)
PROGRESS NOTE  Louis [name] AJG:[id number] DOB: [birth year] DOA: 12/12/2020 PCP: Pcp, No  HPI/Recap of past 69 hours: 55 year old male with past medical history of CAD, hypertension, uncontrolled diabetes mellitus who presented to the [facility] emergency room with left-sided back pain x2 days.  CT scan unremarkable, but MRI noted osteomyelitis/discitis at L3-S1 and patient was transferred to [facility] to the hospitalist service on 6/19 given lack of infectious disease and neurosurgical coverage in [place].  Patient also noted to have MRSA bacteremia and a left psoas myositis but no discrete fluid collection.  Started on vancomycin.  Patient seen by neurosurgery who recommended no surgical indications at this time.  Infectious disease evaluated patient recommending TTE to rule out endocarditis and depending on clearance of bacteremia, if TEE warranted.  Patient continues to complain of severe lower back pain.  No relief with p.o. opiates or lidocaine patch.  Also notes that he has not had a bowel movement since being transferred to [facility].  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  Have added fentanyl patch.  Patient says IV pain medication worse, but wears off too soon.  Sepsis was present on admission, since has stabilized.  Repeating blood cultures to ensure no further growth.  TTE done on 6/22 morning, results pending Active Problems:   CAD (coronary artery disease): Stable   Dyslipidemia   HTN (hypertension): Blood pressure now better controlled.    Uncontrolled diabetes mellitus ([place]): A1c greater than 9.  Advised the patient is better controlled.    Overweight (BMI  25.0-29.9): Meets criteria BMI greater than 25    Constipated: Likely secondary to opiates.  We will try some lactulose.    Back pain: Have added fentanyl patch and as needed Toradol   Code Status: Full code  Family Communication: Left message for wife  Disposition Plan: Home once back pain controlled, blood cultures cleared and can place And decide on duration of antibiotics.  Hopefully will not need TEE   Consultants: Case discussed with neurosurgery Infectious disease  Procedures: 2D echo: Results pending  Antimicrobials: IV vancomycin 6/19-present  DVT prophylaxis: Lovenox  Level of care: Telemetry Medical   Objective: Vitals:   12/15/20 0446 12/15/20 0905  BP: 113/81 109/76  Pulse: (!) 103 (!) 104  Resp: 20 20  Temp: 97.8 F (36.6 C) 98 F (36.7 C)  SpO2: 94% 96%    Intake/Output Summary (Last 24 hours) at 12/15/2020 1417 Last data filed at 12/15/2020 1300 Gross per 24 hour  Intake 1885 ml  Output 1525 ml  Net 360 ml   Filed Weights   12/13/20 2134 12/14/20 0800 12/14/20 2315  Weight: 95.2 kg 94.1 kg 93.9 kg   Body mass index is 28.87 kg/m.  Exam:  General: Alert and oriented x3, moderate distress secondary to back pain HEENT: Normocephalic atraumatic, mucous membranes slightly dry Cardiovascular: Regular rate and rhythm, S1-S2 Respiratory: Clear to auscultation bilaterally Abdomen: Soft, nontender, nondistended, hypoactive bowel sounds Musculoskeletal: No clubbing or cyanosis or edema Skin: No skin breaks, tears or lesions Psychiatry: Appropriate, no evidence of psychoses Neurology: No focal deficits   Data Reviewed: CBC: Recent Labs  Lab 12/12/20 2044 12/13/20 0046 12/14/20 0347 12/15/20 0654  WBC 11.8* 12.0* 8.9 8.4  HGB 16.4  15.2 14.3 14.1  HCT 48.4 45.7 42.6 41.9  MCV 89.6 91.6 89.7 88.4  PLT 224 178 175 716   Basic Metabolic Panel: Recent Labs  Lab 12/12/20 2044 12/13/20 0046 12/14/20 0347 12/15/20 0654  NA 128* 127*  127* 128*  K 4.2 4.0 4.0 4.1  CL 93* 96* 93* 95*  CO2 21* 17* 22 24  GLUCOSE 156* 188* 173* 205*  BUN _0 CREATININE 1.02 1.09 1.06 0.77  CALCIUM 8.5* 8.1* 8.5* 8.6*   GFR: Estimated Creatinine Clearance: 119.2 mL/min (by C-G formula based on SCr of 0.77 mg/dL). Liver Function Tests: Recent Labs  Lab 12/13/20 0046  AST 44*  ALT 40  ALKPHOS 65  BILITOT 1.9*  PROT 5.9*  ALBUMIN 2.9*   No results for input(s): LIPASE, AMYLASE in the last 168 hours. No results for input(s): AMMONIA in the last 168 hours. Coagulation Profile: Recent Labs  Lab 12/12/20 0025  INR 1.3*   Cardiac Enzymes: No results for input(s): CKTOTAL, CKMB, CKMBINDEX, TROPONINI in the last 168 hours. BNP (last 3 results) No results for input(s): PROBNP in the last 8760 hours. HbA1C: Recent Labs    12/13/20 0047  HGBA1C 9.1*   CBG: Recent Labs  Lab 12/14/20 1156 12/14/20 1652 12/14/20 2050 12/15/20 0648 12/15/20 1206  GLUCAP 157* 157* 137* 224* 193*   Lipid Profile: No results for input(s): CHOL, HDL, LDLCALC, TRIG, CHOLHDL, LDLDIRECT in the last 72 hours. Thyroid Function Tests: No results for input(s): TSH, T4TOTAL, FREET4, T3FREE, THYROIDAB in the last 72 hours. Anemia Panel: No results for input(s): VITAMINB12, FOLATE, FERRITIN, TIBC, IRON, RETICCTPCT in the last 72 hours. Urine analysis:    Component Value Date/Time   COLORURINE YELLOW 12/12/2020 2221   APPEARANCEUR CLEAR 12/12/2020 2221   LABSPEC 1.025 12/12/2020 2221   PHURINE 5.5 12/12/2020 2221   GLUCOSEU >=500 (A) 12/12/2020 2221   HGBUR SMALL (A) 12/12/2020 2221   BILIRUBINUR SMALL (A) 12/12/2020 2221   KETONESUR >80 (A) 12/12/2020 2221   PROTEINUR 30 (A) 12/12/2020 2221   NITRITE NEGATIVE 12/12/2020 2221   LEUKOCYTESUR NEGATIVE 12/12/2020 2221   Sepsis Labs: _1 (procalcitonin:4,lacticidven:4)  ) Recent Results (from the past 240 hour(s))  SARS CORONAVIRUS 2 (TAT 6-24 HRS) Nasopharyngeal Nasopharyngeal  Swab     Status: None   Collection Time: 12/12/20 11:09 PM   Specimen: Nasopharyngeal Swab  Result Value Ref Range Status   SARS Coronavirus 2 NEGATIVE NEGATIVE Final    Comment: (NOTE) SARS-CoV-2 target nucleic acids are NOT DETECTED.  The SARS-CoV-2 RNA is generally detectable in upper and lower respiratory specimens during the acute phase of infection. Negative results do not preclude SARS-CoV-2 infection, do not rule out co-infections with other pathogens, and should not be used as the sole basis for treatment or other patient management decisions. Negative results must be combined with clinical observations, patient history, and epidemiological information. The expected result is Negative.  Fact Sheet for Patients: SugarRoll.be  Fact Sheet for Healthcare Providers: https://www.woods-mathews.com/  This test is not yet approved or cleared by the [place] FDA and  has been authorized for detection and/or diagnosis of SARS-CoV-2 by FDA under an Emergency Use Authorization (EUA). This EUA will remain  in effect (meaning this test can be used) for the duration of the COVID-19 declaration under Se ction 564(b)(1) of the Act, 21 U.S.C. section 360bbb-3(b)(1), unless the authorization is terminated or revoked sooner.  Performed at [facility], [place] [address], [place], [place] [address]  Blood culture (routine single)     Status: Abnormal   Collection Time: 12/13/20 12:25 AM   Specimen: BLOOD LEFT FOREARM  Result Value Ref Range Status   Specimen Description BLOOD LEFT FOREARM  Final   Special Requests   Final    BOTTLES DRAWN AEROBIC AND ANAEROBIC Blood Culture results may not be optimal due to an inadequate volume of blood received in culture bottles   Culture  Setup Time   Final    GRAM POSITIVE COCCI IN BOTH AEROBIC AND ANAEROBIC BOTTLES Organism ID to follow CRITICAL RESULT CALLED TO, READ BACK BY AND VERIFIED  WITH: PHARMD LYDIA CHEN AT 2041 ON 12/13/20 BY KJ Performed at [facility], [place] [address] [place], [place] [address]    Culture METHICILLIN RESISTANT STAPHYLOCOCCUS AUREUS (A)  Final   Report Status 12/15/2020 FINAL  Final   Organism ID, Bacteria METHICILLIN RESISTANT STAPHYLOCOCCUS AUREUS  Final      Susceptibility   Methicillin resistant staphylococcus aureus - MIC*    CIPROFLOXACIN >=8 RESISTANT Resistant     ERYTHROMYCIN >=8 RESISTANT Resistant     GENTAMICIN <=0.5 SENSITIVE Sensitive     OXACILLIN >=4 RESISTANT Resistant     TETRACYCLINE <=1 SENSITIVE Sensitive     VANCOMYCIN 1 SENSITIVE Sensitive     TRIMETH/SULFA 80 RESISTANT Resistant     CLINDAMYCIN <=0.25 SENSITIVE Sensitive     RIFAMPIN <=0.5 SENSITIVE Sensitive     Inducible Clindamycin NEGATIVE Sensitive     * METHICILLIN RESISTANT STAPHYLOCOCCUS AUREUS  Blood Culture ID Panel (Reflexed)     Status: Abnormal   Collection Time: 12/13/20 12:25 AM  Result Value Ref Range Status   Enterococcus faecalis NOT DETECTED NOT DETECTED Final   Enterococcus Faecium NOT DETECTED NOT DETECTED Final   Listeria monocytogenes NOT DETECTED NOT DETECTED Final   Staphylococcus species DETECTED (A) NOT DETECTED Final    Comment: CRITICAL RESULT CALLED TO, READ BACK BY AND VERIFIED WITH: PHARMD LYDIA CHEN AT 2045 ON 12/13/20 BY KJ    Staphylococcus aureus (BCID) DETECTED (A) NOT DETECTED Final    Comment: Methicillin (oxacillin)-resistant Staphylococcus aureus (MRSA). MRSA is predictably resistant to beta-lactam antibiotics (except ceftaroline). Preferred therapy is vancomycin unless clinically contraindicated. Patient requires contact precautions if  hospitalized. CRITICAL RESULT CALLED TO, READ BACK BY AND VERIFIED WITH: PHARMD LYDIA CHEN AT 2045 ON 12/13/20 BY KJ    Staphylococcus epidermidis NOT DETECTED NOT DETECTED Final   Staphylococcus lugdunensis NOT DETECTED NOT DETECTED Final   Streptococcus species NOT DETECTED NOT  DETECTED Final   Streptococcus agalactiae NOT DETECTED NOT DETECTED Final   Streptococcus pneumoniae NOT DETECTED NOT DETECTED Final   Streptococcus pyogenes NOT DETECTED NOT DETECTED Final   A.calcoaceticus-baumannii NOT DETECTED NOT DETECTED Final   Bacteroides fragilis NOT DETECTED NOT DETECTED Final   Enterobacterales NOT DETECTED NOT DETECTED Final   Enterobacter cloacae complex NOT DETECTED NOT DETECTED Final   Escherichia coli NOT DETECTED NOT DETECTED Final   Klebsiella aerogenes NOT DETECTED NOT DETECTED Final   Klebsiella oxytoca NOT DETECTED NOT DETECTED Final   Klebsiella pneumoniae NOT DETECTED NOT DETECTED Final   Proteus species NOT DETECTED NOT DETECTED Final   Salmonella species NOT DETECTED NOT DETECTED Final   Serratia marcescens NOT DETECTED NOT DETECTED Final   Haemophilus influenzae NOT DETECTED NOT DETECTED Final   Neisseria meningitidis NOT DETECTED NOT DETECTED Final   Pseudomonas aeruginosa NOT DETECTED NOT DETECTED Final   Stenotrophomonas maltophilia NOT DETECTED NOT DETECTED Final   Candida  albicans NOT DETECTED NOT DETECTED Final   Candida auris NOT DETECTED NOT DETECTED Final   Candida glabrata NOT DETECTED NOT DETECTED Final   Candida krusei NOT DETECTED NOT DETECTED Final   Candida parapsilosis NOT DETECTED NOT DETECTED Final   Candida tropicalis NOT DETECTED NOT DETECTED Final   Cryptococcus neoformans/gattii NOT DETECTED NOT DETECTED Final   Meth resistant mecA/C and MREJ DETECTED (A) NOT DETECTED Final    Comment: CRITICAL RESULT CALLED TO, READ BACK BY AND VERIFIED WITH: PHARMD LYDIA CHEN AT 2045 ON 12/13/20 BY KJ Performed at [facility], [place] [address] [place], [place] [address]   Urine culture     Status: Abnormal   Collection Time: 12/13/20 12:30 AM   Specimen: In/Out Cath Urine  Result Value Ref Range Status   Specimen Description IN/OUT CATH URINE  Final   Special Requests NONE  Final   Culture (A)  Final    <10,000 COLONIES/mL  INSIGNIFICANT GROWTH Performed at [facility], [place] [address] [place], [place] [address]    Report Status 12/14/2020 FINAL  Final  MRSA Next Gen by PCR, Nasal     Status: Abnormal   Collection Time: 12/14/20 11:31 AM   Specimen: Nasal Mucosa; Nasal Swab  Result Value Ref Range Status   MRSA by PCR Next Gen DETECTED (A) NOT DETECTED Final    Comment: RESULT CALLED TO, READ BACK BY AND VERIFIED WITH: Tawni Levy RN [id number] 12/14/20 A BROWNING (NOTE) The GeneXpert MRSA Assay (FDA approved for NASAL specimens only), is one component of a comprehensive MRSA colonization surveillance program. It is not intended to diagnose MRSA infection nor to guide or monitor treatment for MRSA infections. Test performance is not FDA approved in patients less than 71 years old. Performed at [facility], [place] [address], [place], [place] [address]       Studies: No results found.  Scheduled Meds:  amLODipine  2.5 mg Oral q morning   aspirin EC  81 mg Oral q morning   atorvastatin  40 mg Oral QHS   enoxaparin (LOVENOX) injection  40 mg Subcutaneous Daily   feeding supplement  237 mL Oral TID BM   fentaNYL  1 patch Transdermal Q72H   furosemide  20 mg Oral q morning   icosapent Ethyl  2 g Oral BID   insulin aspart  0-15 Units Subcutaneous TID WC   losartan  25 mg Oral q morning   metoprolol succinate  25 mg Oral QHS   multivitamin with minerals  1 tablet Oral Daily   nicotine  21 mg Transdermal Daily   pantoprazole  40 mg Oral Daily   sodium chloride flush  3 mL Intravenous Q12H   venlafaxine XR  75 mg Oral Q breakfast    Continuous Infusions:  DAPTOmycin (CUBICIN)  IV 750 mg (12/15/20 1400)     LOS: 2 days     Annita Brod, MD [facility]   12/15/2020, 2:17 PM

## 2020-12-15 NOTE — Progress Notes (Addendum)
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 At this time no other obvious metastatic focus of infection Ortho spine had discussed case with primary team and no surgical intervention   Given this is mrsa bsi, no indication to sample vertebral body for targetted therapy   Duration of tx would be at least 8 weeks    6/22 assessment Tte done result pending Patient's insurance will cover dapto, which is a safer alternative than vanc Repeat bcx pending Patient still with significant pain lower back but no new sx suggesting progression disease  Plan: Switch vanc to daptomycin today Reviewed ddi with our pharmacy team; in setting of potential risk of myositis/hepatitis with dapto, will decrease his lipitor from 80 to 40 mg daily (no recent cv events) F/u tte; if he clears bacteremia quickly (<3 days) and sepsis defervesed quickly, will defer tee  F/u repeat bcx  I spent more than 35 minute reviewing data/chart, and coordinating care and >50% direct face to face time providing counseling/discussing diagnostics/treatment plan with patient   Principal Problem:   MRSA bacteremia Active Problems:   CAD (coronary artery disease)   Dyslipidemia   HTN  (hypertension)   Uncontrolled diabetes mellitus (HCC)   Osteomyelitis (HCC)   Tachycardia   Overweight (BMI 25.0-29.9)   Constipated   Back pain   No Known Allergies  Scheduled Meds:  amLODipine  2.5 mg Oral q morning   aspirin EC  81 mg Oral q morning   atorvastatin  40 mg Oral QHS   enoxaparin (LOVENOX) injection  40 mg Subcutaneous Daily   feeding supplement  237 mL Oral TID BM   fentaNYL  1 patch Transdermal Q72H   furosemide  20 mg Oral q morning   icosapent Ethyl  2 g Oral BID   insulin aspart  0-15 Units Subcutaneous TID WC   lactulose  10 g Oral Daily   losartan  25 mg Oral q morning   metoprolol succinate  25 mg Oral QHS   multivitamin with minerals  1 tablet Oral Daily   nicotine  21 mg Transdermal Daily   pantoprazole  40 mg Oral Daily   sodium chloride flush  3 mL Intravenous Q12H   venlafaxine XR  75 mg Oral Q breakfast   Continuous Infusions:  DAPTOmycin (CUBICIN)  IV 750 mg (12/15/20 1400)   PRN Meds:.acetaminophen **OR** acetaminophen, ALPRAZolam, bisacodyl, diclofenac Sodium, gabapentin, HYDROcodone-acetaminophen, HYDROmorphone (DILAUDID) injection, ketorolac, lidocaine,  ondansetron (ZOFRAN) IV, polyethylene glycol, tiZANidine   SUBJECTIVE: Pain still a major issue this morning appeared controlled though No new neurological deficit No n/v/diarrhea/rash Tte done not yet resulted   Review of Systems: ROS All other ROS was negative, except mentioned above     OBJECTIVE: Vitals:   12/14/20 2001 12/14/20 2315 12/15/20 0446 12/15/20 0905  BP: 135/80  113/81 109/76  Pulse: (!) 103  (!) 103 (!) 104  Resp: 18  20 20   Temp: 98.4 F (36.9 C)  97.8 F (36.6 C) 98 F (36.7 C)  TempSrc: Oral  Oral Oral  SpO2: 97%  94% 96%  Weight:  93.9 kg    Height:       Body mass index is 28.87 kg/m.  Physical Exam General/constitutional: no distress, pleasant HEENT: Normocephalic, PER, Conj Clear, EOMI, Oropharynx clear Neck supple CV: rrr no mrg Lungs:  clear to auscultation, normal respiratory effort Abd: Soft, Nontender Ext: no edema Skin: No Rash Neuro: nonfocal MSK: tender lower back  Lab Results Lab Results  Component Value Date   WBC 8.4 12/15/2020   HGB 14.1 12/15/2020   HCT 41.9 12/15/2020   MCV 88.4 12/15/2020   PLT 179 12/15/2020    Lab Results  Component Value Date   CREATININE 0.77 12/15/2020   BUN 20 12/15/2020   NA 128 (L) 12/15/2020   K 4.1 12/15/2020   CL 95 (L) 12/15/2020   CO2 24 12/15/2020    Lab Results  Component Value Date   ALT 40 12/13/2020   AST 44 (H) 12/13/2020   ALKPHOS 65 12/13/2020   BILITOT 1.9 (H) 12/13/2020      Microbiology: Recent Results (from the past 240 hour(s))  SARS CORONAVIRUS 2 (TAT 6-24 HRS) Nasopharyngeal Nasopharyngeal Swab     Status: None   Collection Time: 12/12/20 11:09 PM   Specimen: Nasopharyngeal Swab  Result Value Ref Range Status   SARS Coronavirus 2 NEGATIVE NEGATIVE Final    Comment: (NOTE) SARS-CoV-2 target nucleic acids are NOT DETECTED.  The SARS-CoV-2 RNA is generally detectable in upper and lower respiratory specimens during the acute phase of infection. Negative results do not preclude SARS-CoV-2 infection, do not rule out co-infections with other pathogens, and should not be used as the sole basis for treatment or other patient management decisions. Negative results must be combined with clinical observations, patient history, and epidemiological information. The expected result is Negative.  Fact Sheet for Patients: HairSlick.nohttps://www.fda.gov/media/138098/download  Fact Sheet for Healthcare Providers: quierodirigir.comhttps://www.fda.gov/media/138095/download  This test is not yet approved or cleared by the [place] FDA and  has been authorized for detection and/or diagnosis of SARS-CoV-2 by FDA under an Emergency Use Authorization (EUA). This EUA will remain  in effect (meaning this test can be used) for the duration of the COVID-19 declaration under Se  ction 564(b)(1) of the Act, 21 U.S.C. section 360bbb-3(b)(1), unless the authorization is terminated or revoked sooner.  Performed at [facility], [address] [place], [place] [id number]   Blood culture (routine single)     Status: Abnormal   Collection Time: 12/13/20 12:25 AM   Specimen: BLOOD LEFT FOREARM  Result Value Ref Range Status   Specimen Description BLOOD LEFT FOREARM  Final   Special Requests   Final    BOTTLES DRAWN AEROBIC AND ANAEROBIC Blood Culture results may not be optimal due to an inadequate volume of blood received in culture bottles   Culture  Setup Time   Final    GRAM  POSITIVE COCCI IN BOTH AEROBIC AND ANAEROBIC BOTTLES Organism ID to follow CRITICAL RESULT CALLED TO, READ BACK BY AND VERIFIED WITH: PHARMD LYDIA CHEN AT 2041 ON 12/13/20 BY KJ Performed at [facility], [address], [place], [place] [address]    Culture METHICILLIN RESISTANT STAPHYLOCOCCUS AUREUS (A)  Final   Report Status 12/15/2020 FINAL  Final   Organism ID, Bacteria METHICILLIN RESISTANT STAPHYLOCOCCUS AUREUS  Final      Susceptibility   Methicillin resistant staphylococcus aureus - MIC*    CIPROFLOXACIN >=8 RESISTANT Resistant     ERYTHROMYCIN >=8 RESISTANT Resistant     GENTAMICIN <=0.5 SENSITIVE Sensitive     OXACILLIN >=4 RESISTANT Resistant     TETRACYCLINE <=1 SENSITIVE Sensitive     VANCOMYCIN 1 SENSITIVE Sensitive     TRIMETH/SULFA 80 RESISTANT Resistant     CLINDAMYCIN <=0.25 SENSITIVE Sensitive     RIFAMPIN <=0.5 SENSITIVE Sensitive     Inducible Clindamycin NEGATIVE Sensitive     * METHICILLIN RESISTANT STAPHYLOCOCCUS AUREUS  Blood Culture ID Panel (Reflexed)     Status: Abnormal   Collection Time: 12/13/20 12:25 AM  Result Value Ref Range Status   Enterococcus faecalis NOT DETECTED NOT DETECTED Final   Enterococcus Faecium NOT DETECTED NOT DETECTED Final   Listeria monocytogenes NOT DETECTED NOT DETECTED Final   Staphylococcus species DETECTED (A)  NOT DETECTED Final    Comment: CRITICAL RESULT CALLED TO, READ BACK BY AND VERIFIED WITH: PHARMD LYDIA CHEN AT 2045 ON 12/13/20 BY KJ    Staphylococcus aureus (BCID) DETECTED (A) NOT DETECTED Final    Comment: Methicillin (oxacillin)-resistant Staphylococcus aureus (MRSA). MRSA is predictably resistant to beta-lactam antibiotics (except ceftaroline). Preferred therapy is vancomycin unless clinically contraindicated. Patient requires contact precautions if  hospitalized. CRITICAL RESULT CALLED TO, READ BACK BY AND VERIFIED WITH: PHARMD LYDIA CHEN AT 2045 ON 12/13/20 BY KJ    Staphylococcus epidermidis NOT DETECTED NOT DETECTED Final   Staphylococcus lugdunensis NOT DETECTED NOT DETECTED Final   Streptococcus species NOT DETECTED NOT DETECTED Final   Streptococcus agalactiae NOT DETECTED NOT DETECTED Final   Streptococcus pneumoniae NOT DETECTED NOT DETECTED Final   Streptococcus pyogenes NOT DETECTED NOT DETECTED Final   A.calcoaceticus-baumannii NOT DETECTED NOT DETECTED Final   Bacteroides fragilis NOT DETECTED NOT DETECTED Final   Enterobacterales NOT DETECTED NOT DETECTED Final   Enterobacter cloacae complex NOT DETECTED NOT DETECTED Final   Escherichia coli NOT DETECTED NOT DETECTED Final   Klebsiella aerogenes NOT DETECTED NOT DETECTED Final   Klebsiella oxytoca NOT DETECTED NOT DETECTED Final   Klebsiella pneumoniae NOT DETECTED NOT DETECTED Final   Proteus species NOT DETECTED NOT DETECTED Final   Salmonella species NOT DETECTED NOT DETECTED Final   Serratia marcescens NOT DETECTED NOT DETECTED Final   Haemophilus influenzae NOT DETECTED NOT DETECTED Final   Neisseria meningitidis NOT DETECTED NOT DETECTED Final   Pseudomonas aeruginosa NOT DETECTED NOT DETECTED Final   Stenotrophomonas maltophilia NOT DETECTED NOT DETECTED Final   Candida albicans NOT DETECTED NOT DETECTED Final   Candida auris NOT DETECTED NOT DETECTED Final   Candida glabrata NOT DETECTED NOT DETECTED Final    Candida krusei NOT DETECTED NOT DETECTED Final   Candida parapsilosis NOT DETECTED NOT DETECTED Final   Candida tropicalis NOT DETECTED NOT DETECTED Final   Cryptococcus neoformans/gattii NOT DETECTED NOT DETECTED Final   Meth resistant mecA/C and MREJ DETECTED (A) NOT DETECTED Final    Comment: CRITICAL RESULT CALLED TO, READ BACK BY AND  VERIFIED WITH: PHARMD LYDIA CHEN AT 2045 ON 12/13/20 BY KJ Performed at [facility], [address] [place], [place] [address]   Urine culture     Status: Abnormal   Collection Time: 12/13/20 12:30 AM   Specimen: In/Out Cath Urine  Result Value Ref Range Status   Specimen Description IN/OUT CATH URINE  Final   Special Requests NONE  Final   Culture (A)  Final    <10,000 COLONIES/mL INSIGNIFICANT GROWTH Performed at [facility], [address] [place], [place] [address]    Report Status 12/14/2020 FINAL  Final  MRSA Next Gen by PCR, Nasal     Status: Abnormal   Collection Time: 12/14/20 11:31 AM   Specimen: Nasal Mucosa; Nasal Swab  Result Value Ref Range Status   MRSA by PCR Next Gen DETECTED (A) NOT DETECTED Final    Comment: RESULT CALLED TO, READ BACK BY AND VERIFIED WITH: Kennith Gain RN [id number] 12/14/20 A BROWNING (NOTE) The GeneXpert MRSA Assay (FDA approved for NASAL specimens only), is one component of a comprehensive MRSA colonization surveillance program. It is not intended to diagnose MRSA infection nor to guide or monitor treatment for MRSA infections. Test performance is not FDA approved in patients less than 60 years old. Performed at [facility], [address], [place], [place] [address]      Serology:   Imaging: If present, new imagings (plain films, ct scans, and mri) have been personally visualized and interpreted; radiology reports have been reviewed. Decision making incorporated into the Impression / Recommendations.  6/19 mri l-spine/abd 6/19 mri abd/l-spine 1. 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 No suspicious kidney lesions identified at this time. 2. Findings compatible with discitis/osteomyelitis involving the L3-4, L4-5 and L5-S1 levels. Surrounding edema and enhancement extends into the left psoas muscle. No discrete fluid collection identified to suggest abscess. See lumbar spine report from today.    Raymondo Band, MD [facility] [phone] pager    12/15/2020, 4:25 PM

## 2020-12-16 ENCOUNTER — Inpatient Hospital Stay (HOSPITAL_COMMUNITY): Payer: Federal, State, Local not specified - PPO

## 2020-12-16 DIAGNOSIS — E1165 Type 2 diabetes mellitus with hyperglycemia: Secondary | ICD-10-CM

## 2020-12-16 LAB — URINALYSIS, ROUTINE W REFLEX MICROSCOPIC
Bacteria, UA: NONE SEEN
Bilirubin Urine: NEGATIVE
Glucose, UA: >=500 mg/dL — AB
Ketones, ur: 20 mg/dL — AB
Leukocytes,Ua: NEGATIVE
Nitrite: NEGATIVE
Protein, ur: 30 mg/dL — AB
Specific Gravity, Urine: 1.018 (ref 1.005–1.030)
pH: 6 (ref 5.0–8.0)

## 2020-12-16 LAB — BLOOD GAS, ARTERIAL
Acid-base deficit: 1 mmol/L (ref 0.0–2.0)
Bicarbonate: 22.5 mmol/L (ref 20.0–28.0)
Drawn by: 535271
FIO2: 21
O2 Saturation: 95.6 %
Patient temperature: 36.8
pCO2 arterial: 32.9 mmHg (ref 32.0–48.0)
pH, Arterial: 7.449 (ref 7.350–7.450)
pO2, Arterial: 72.7 mmHg — ABNORMAL LOW (ref 83.0–108.0)

## 2020-12-16 LAB — CBC
HCT: 43.6 % (ref 39.0–52.0)
Hemoglobin: 15.1 g/dL (ref 13.0–17.0)
MCH: 30 pg (ref 26.0–34.0)
MCHC: 34.6 g/dL (ref 30.0–36.0)
MCV: 86.7 fL (ref 80.0–100.0)
Platelets: 200 10*3/uL (ref 150–400)
RBC: 5.03 MIL/uL (ref 4.22–5.81)
RDW: 15.3 % (ref 11.5–15.5)
WBC: 11.1 10*3/uL — ABNORMAL HIGH (ref 4.0–10.5)
nRBC: 0 % (ref 0.0–0.2)

## 2020-12-16 LAB — BASIC METABOLIC PANEL
Anion gap: 10 (ref 5–15)
BUN: 31 mg/dL — ABNORMAL HIGH (ref 6–20)
CO2: 24 mmol/L (ref 22–32)
Calcium: 8.9 mg/dL (ref 8.9–10.3)
Chloride: 95 mmol/L — ABNORMAL LOW (ref 98–111)
Creatinine, Ser: 0.96 mg/dL (ref 0.61–1.24)
GFR, Estimated: >60 mL/min (ref >60)
Glucose, Bld: 250 mg/dL — ABNORMAL HIGH (ref 70–99)
Potassium: 4 mmol/L (ref 3.5–5.1)
Sodium: 129 mmol/L — ABNORMAL LOW (ref 135–145)

## 2020-12-16 LAB — AMMONIA: Ammonia: 27 umol/L (ref 9–35)

## 2020-12-16 LAB — GLUCOSE, CAPILLARY
Glucose-Capillary: 269 mg/dL — ABNORMAL HIGH (ref 70–99)
Glucose-Capillary: 240 mg/dL — ABNORMAL HIGH (ref 70–99)

## 2020-12-16 LAB — CK: Total CK: 53 U/L (ref 49–397)

## 2020-12-16 MED ORDER — SORBITOL 70 % SOLN
960.0000 mL | TOPICAL_OIL | Freq: Once | ORAL | Status: DC
  Filled 2020-12-16: qty 473

## 2020-12-16 MED ORDER — SODIUM CHLORIDE 0.9 % IV SOLN: INTRAVENOUS | Status: DC

## 2020-12-16 MED ORDER — DIPHENHYDRAMINE HCL 12.5 MG/5ML PO ELIX: 12.5000 mg | ORAL_SOLUTION | Freq: Four times a day (QID) | ORAL | Status: DC | PRN

## 2020-12-16 MED ORDER — SODIUM CHLORIDE 0.9% FLUSH: 9.0000 mL | INTRAVENOUS | Status: DC | PRN

## 2020-12-16 MED ORDER — DIPHENHYDRAMINE HCL 50 MG/ML IJ SOLN: 12.5000 mg | Freq: Four times a day (QID) | INTRAMUSCULAR | Status: DC | PRN

## 2020-12-16 MED ORDER — HYDROMORPHONE 1 MG/ML IV SOLN
INTRAVENOUS | Status: DC
  Administered 2020-12-16: 2.4 mL via INTRAVENOUS
  Administered 2020-12-16: 1.1 mg via INTRAVENOUS
  Administered 2020-12-17: 3.3 mL via INTRAVENOUS
  Administered 2020-12-17: 1.2 mL via INTRAVENOUS
  Administered 2020-12-17: 2.7 mL via INTRAVENOUS
  Filled 2020-12-16: qty 30

## 2020-12-16 MED ORDER — PROCHLORPERAZINE EDISYLATE 10 MG/2ML IJ SOLN
10.0000 mg | INTRAMUSCULAR | Status: DC | PRN
  Administered 2020-12-17 – 2020-12-20 (×5): 10 mg via INTRAVENOUS
  Filled 2020-12-16 (×5): qty 2

## 2020-12-16 MED ORDER — IOHEXOL 300 MG/ML  SOLN
100.0000 mL | Freq: Once | INTRAMUSCULAR | Status: AC | PRN
  Administered 2020-12-16: 100 mL via INTRAVENOUS

## 2020-12-16 MED ORDER — NALOXONE HCL 0.4 MG/ML IJ SOLN: 0.4000 mg | INTRAMUSCULAR | Status: DC | PRN

## 2020-12-16 NOTE — Progress Notes (Signed)
Inpatient Diabetes Program Recommendations  AACE/ADA: New Consensus Statement on Inpatient Glycemic Control (2015)  Target Ranges:  Prepandial:   less than 140 mg/dL      Peak postprandial:   less than 180 mg/dL (1-2 hours)      Critically ill patients:  140 - 180 mg/dL   Results for Louis [name], Louis [name] (MRN [id number]) as of 12/16/2020 09:29  Ref. Range 12/15/2020 06:48 12/15/2020 12:06 12/15/2020 16:53 12/15/2020 21:39 12/16/2020 06:45  Glucose-Capillary Latest Ref Range: 70 - 99 mg/dL 967 (H) 591 (H) 638 (H) 247 (H) 269 (H)    Review of Glycemic Control  Diabetes history: DM2 Outpatient Diabetes medications: Farxiga 10 QAM, Glipizide 5 mg BID, Metformin 1000 mg QAM, Metformin 500 mg QPM Current orders for Inpatient glycemic control: Novolog 0-15 units TID with meals   Inpatient Diabetes Program Recommendations:     Insulin: Please consider ordering Lantus 5 units Q24H and adding Novolog 0-5 units QHS.   HbgA1C:  A1C 9.1% on 12/13/20 indicating an average glucose of 214 mg/dl over the past 2-3 months.

## 2020-12-16 NOTE — Plan of Care (Signed)
  Problem: Coping: Goal: Level of anxiety will decrease Outcome: Progressing   Problem: Pain Managment: Goal: General experience of comfort will improve Outcome: Progressing   

## 2020-12-16 NOTE — Progress Notes (Signed)
Brief Nutrition Note  Attempted to call pt's room phone to provide Diabetes education for poorly controlled T2DM.  Pt did not answer phone. Will attempt to follow up for education. Provided "Carbohydrate Counting for People with Diabetes" handout from the Academy of Nutrition and Dietetics in discharge summary.  If need arises, please re-consult RD.  Vertell Limber, RD, LDN (she/her/hers) Registered Dietitian I After-Hours/Weekend Pager # in [place]

## 2020-12-16 NOTE — Progress Notes (Signed)
PROGRESS NOTE  Louis [name] IOM:[id number] DOB: 1965-12-09 DOA: 12/12/2020 PCP: Pcp, No  HPI/Recap of past 42 hours: 55 year old male with past medical history of CAD, hypertension, uncontrolled diabetes mellitus who presented to the [facility] emergency room with left-sided back pain x2 days.  CT scan unremarkable, but MRI noted osteomyelitis/discitis at L3-S1 and patient was transferred to [facility] to the hospitalist service on 6/19 given lack of infectious disease and neurosurgical coverage in [place].  Patient also noted to have MRSA bacteremia and a left psoas myositis but no discrete fluid collection.  Started on vancomycin. EDP discussed case with neurosurgery who recommended no surgical indications at this time.  Infectious disease evaluated patient recommending TTE to rule out endocarditis and depending on clearance of bacteremia, if TEE warranted.  Patient continues to complain of severe lower back pain.  No relief with p.o. opiates or lidocaine patch.  Also notes that he has not had a bowel movement since being transferred to [facility].  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 Sepsis was present on admission, since has stabilized.  Repeat blood cultures drawn on 6/22 and are currently in process.  TTE done without signs of vegetations. ID following. Currently on daptomycin. Active Problems:   CAD (coronary artery disease): Stable   Dyslipidemia   HTN (hypertension): Blood pressure now better controlled.    Uncontrolled diabetes mellitus ([place]): A1c greater than 9.  Advised the patient is better controlled.    Overweight (BMI 25.0-29.9): Meets criteria BMI greater  than 25    Constipated: abdomen appears distended. Wife reports that patient has not had a BM for 7 days. He has receiving lactulose without benefit. Will give an enema. Suspect ileus vs. Sbo in the setting of opiate use. He continues to have nausea and vomiting. Check abdomen CT    Back pain: uncontrolled despite fentanyl and dilaudid. Yesterday he received a total of $Remove'9mg'FiombQX$  of IV dilaudid. He has received several doses today and still appears uncomfortable. He was also started on fentanyl patch and has been receiving hydrocodone without improvement. Will discontinue all opiates for now and start on dilaudid PCA which can be adjusted based on his needs.  Acute encephalopathy: suspect this is related to uncontrolled pain and opiates. Will check ABG, ammonia, Ct head and UA to rule out other causes  Hyponatremia. May have a component of dehydration, although review of records indicate that he has had low sodium since 2020. It is unlikely that this is contributing to his encephalopathy. Continue to follow   Code Status: Full code  Family Communication: discussed with wife at the bedside  Disposition Plan: Home once back pain controlled, blood cultures cleared and can place PICC And decide on duration of antibiotics.  Hopefully will not need TEE   Consultants: Case discussed with neurosurgery Infectious disease  Procedures: 2D echo: no evidence of vegetations  Antimicrobials: IV vancomycin 6/19-6/22 Daptomycin 6/22>  DVT prophylaxis: Lovenox  Level of care: Telemetry Medical   Objective: Vitals:   12/15/20 2134 12/16/20 0506  BP: 93/76 94/71  Pulse: 95 97  Resp: 20 18  Temp: (!) 97.5 F (36.4 C) 98.4 F (36.9 C)  SpO2: 96% 90%    Intake/Output Summary (Last 24 hours) at 12/16/2020 1200  Last data filed at 12/16/2020 1023 Gross per 24 hour  Intake 723 ml  Output 950 ml  Net -227 ml   Filed Weights   12/14/20 0800 12/14/20 2315 12/15/20 2134  Weight: 94.1 kg 93.9 kg 94.1  kg   Body mass index is 28.93 kg/m.  Exam:  General exam: somnolent, writhing around in bed due to pain, dry heaving at times Respiratory system: Clear to auscultation. Respiratory effort normal. Cardiovascular system:RRR. No murmurs, rubs, gallops. Gastrointestinal system: Abdomen is distended, soft and nontender. No organomegaly or masses felt. Sluggish bowel sounds heard. Central nervous system: No focal neurological deficits. No facial asymmetry, moving all extremities spontaneously Extremities: No C/C/E, +pedal pulses Skin: No rashes, lesions or ulcers Psychiatry: somnolent, writhing around due to pain    Data Reviewed: CBC: Recent Labs  Lab 12/12/20 2044 12/13/20 0046 12/14/20 0347 12/15/20 0654 12/16/20 0552  WBC 11.8* 12.0* 8.9 8.4 11.1*  HGB 16.4 15.2 14.3 14.1 15.1  HCT 48.4 45.7 42.6 41.9 43.6  MCV 89.6 91.6 89.7 88.4 86.7  PLT 224 178 175 179 497   Basic Metabolic Panel: Recent Labs  Lab 12/12/20 2044 12/13/20 0046 12/14/20 0347 12/15/20 0654 12/16/20 0552  NA 128* 127* 127* 128* 129*  K 4.2 4.0 4.0 4.1 4.0  CL 93* 96* 93* 95* 95*  CO2 21* 17* $Remov'22 24 24  'jbrpZC$ GLUCOSE 156* 188* 173* 205* 250*  BUN $Re'16 15 17 20 'MNO$ 31*  CREATININE 1.02 1.09 1.06 0.77 0.96  CALCIUM 8.5* 8.1* 8.5* 8.6* 8.9   GFR: Estimated Creatinine Clearance: 99.4 mL/min (by C-G formula based on SCr of 0.96 mg/dL). Liver Function Tests: Recent Labs  Lab 12/13/20 0046  AST 44*  ALT 40  ALKPHOS 65  BILITOT 1.9*  PROT 5.9*  ALBUMIN 2.9*   No results for input(s): LIPASE, AMYLASE in the last 168 hours. No results for input(s): AMMONIA in the last 168 hours. Coagulation Profile: Recent Labs  Lab 12/12/20 0025  INR 1.3*   Cardiac Enzymes: Recent Labs  Lab 12/16/20 0552  CKTOTAL 53   BNP (last 3 results) No results for input(s): PROBNP in the last 8760 hours. HbA1C: No results for input(s): HGBA1C in the last 72 hours.  CBG: Recent Labs  Lab 12/15/20 0648 12/15/20 1206  12/15/20 1653 12/15/20 2139 12/16/20 0645  GLUCAP 224* 193* 187* 247* 269*   Lipid Profile: No results for input(s): CHOL, HDL, LDLCALC, TRIG, CHOLHDL, LDLDIRECT in the last 72 hours. Thyroid Function Tests: No results for input(s): TSH, T4TOTAL, FREET4, T3FREE, THYROIDAB in the last 72 hours. Anemia Panel: No results for input(s): VITAMINB12, FOLATE, FERRITIN, TIBC, IRON, RETICCTPCT in the last 72 hours. Urine analysis:    Component Value Date/Time   COLORURINE YELLOW 12/12/2020 2221   APPEARANCEUR CLEAR 12/12/2020 2221   LABSPEC 1.025 12/12/2020 2221   PHURINE 5.5 12/12/2020 2221   GLUCOSEU >=500 (A) 12/12/2020 2221   HGBUR SMALL (A) 12/12/2020 2221   BILIRUBINUR SMALL (A) 12/12/2020 2221   KETONESUR >80 (A) 12/12/2020 2221   PROTEINUR 30 (A) 12/12/2020 2221   NITRITE NEGATIVE 12/12/2020 2221   LEUKOCYTESUR NEGATIVE 12/12/2020 2221   Sepsis Labs: $RemoveBefo'@LABRCNTIP'dCecvprfBDh$ (procalcitonin:4,lacticidven:4)  ) Recent Results (from the past 240 hour(s))  SARS CORONAVIRUS 2 (TAT 6-24 HRS) Nasopharyngeal Nasopharyngeal Swab     Status: None   Collection Time: 12/12/20 11:09 PM   Specimen: Nasopharyngeal Swab  Result Value Ref Range Status   SARS Coronavirus 2 NEGATIVE NEGATIVE Final    Comment: (NOTE) SARS-CoV-2 target nucleic acids  are NOT DETECTED.  The SARS-CoV-2 RNA is generally detectable in upper and lower respiratory specimens during the acute phase of infection. Negative results do not preclude SARS-CoV-2 infection, do not rule out co-infections with other pathogens, and should not be used as the sole basis for treatment or other patient management decisions. Negative results must be combined with clinical observations, patient history, and epidemiological information. The expected result is Negative.  Fact Sheet for Patients: HairSlick.no  Fact Sheet for Healthcare Providers: quierodirigir.com  This test is not yet  approved or cleared by the [place] FDA and  has been authorized for detection and/or diagnosis of SARS-CoV-2 by FDA under an Emergency Use Authorization (EUA). This EUA will remain  in effect (meaning this test can be used) for the duration of the COVID-19 declaration under Se ction 564(b)(1) of the Act, 21 U.S.C. section 360bbb-3(b)(1), unless the authorization is terminated or revoked sooner.  Performed at [facility], [address] [place], [place] [address]   Blood culture (routine single)     Status: Abnormal   Collection Time: 12/13/20 12:25 AM   Specimen: BLOOD LEFT FOREARM  Result Value Ref Range Status   Specimen Description BLOOD LEFT FOREARM  Final   Special Requests   Final    BOTTLES DRAWN AEROBIC AND ANAEROBIC Blood Culture results may not be optimal due to an inadequate volume of blood received in culture bottles   Culture  Setup Time   Final    GRAM POSITIVE COCCI IN BOTH AEROBIC AND ANAEROBIC BOTTLES Organism ID to follow CRITICAL RESULT CALLED TO, READ BACK BY AND VERIFIED WITH: PHARMD LYDIA CHEN AT 2041 ON 12/13/20 BY KJ Performed at [facility], [address] [place], [place] [address]    Culture METHICILLIN RESISTANT STAPHYLOCOCCUS AUREUS (A)  Final   Report Status 12/15/2020 FINAL  Final   Organism ID, Bacteria METHICILLIN RESISTANT STAPHYLOCOCCUS AUREUS  Final      Susceptibility   Methicillin resistant staphylococcus aureus - MIC*    CIPROFLOXACIN >=8 RESISTANT Resistant     ERYTHROMYCIN >=8 RESISTANT Resistant     GENTAMICIN <=0.5 SENSITIVE Sensitive     OXACILLIN >=4 RESISTANT Resistant     TETRACYCLINE <=1 SENSITIVE Sensitive     VANCOMYCIN 1 SENSITIVE Sensitive     TRIMETH/SULFA 80 RESISTANT Resistant     CLINDAMYCIN <=0.25 SENSITIVE Sensitive     RIFAMPIN <=0.5 SENSITIVE Sensitive     Inducible Clindamycin NEGATIVE Sensitive     * METHICILLIN RESISTANT STAPHYLOCOCCUS AUREUS  Blood Culture ID Panel (Reflexed)     Status:  Abnormal   Collection Time: 12/13/20 12:25 AM  Result Value Ref Range Status   Enterococcus faecalis NOT DETECTED NOT DETECTED Final   Enterococcus Faecium NOT DETECTED NOT DETECTED Final   Listeria monocytogenes NOT DETECTED NOT DETECTED Final   Staphylococcus species DETECTED (A) NOT DETECTED Final    Comment: CRITICAL RESULT CALLED TO, READ BACK BY AND VERIFIED WITH: PHARMD LYDIA CHEN AT 2045 ON 12/13/20 BY KJ    Staphylococcus aureus (BCID) DETECTED (A) NOT DETECTED Final    Comment: Methicillin (oxacillin)-resistant Staphylococcus aureus (MRSA). MRSA is predictably resistant to beta-lactam antibiotics (except ceftaroline). Preferred therapy is vancomycin unless clinically contraindicated. Patient requires contact precautions if  hospitalized. CRITICAL RESULT CALLED TO, READ BACK BY AND VERIFIED WITH: PHARMD LYDIA CHEN AT 2045 ON 12/13/20 BY KJ    Staphylococcus epidermidis NOT DETECTED NOT DETECTED Final   Staphylococcus lugdunensis NOT DETECTED NOT DETECTED Final  Streptococcus species NOT DETECTED NOT DETECTED Final   Streptococcus agalactiae NOT DETECTED NOT DETECTED Final   Streptococcus pneumoniae NOT DETECTED NOT DETECTED Final   Streptococcus pyogenes NOT DETECTED NOT DETECTED Final   A.calcoaceticus-baumannii NOT DETECTED NOT DETECTED Final   Bacteroides fragilis NOT DETECTED NOT DETECTED Final   Enterobacterales NOT DETECTED NOT DETECTED Final   Enterobacter cloacae complex NOT DETECTED NOT DETECTED Final   Escherichia coli NOT DETECTED NOT DETECTED Final   Klebsiella aerogenes NOT DETECTED NOT DETECTED Final   Klebsiella oxytoca NOT DETECTED NOT DETECTED Final   Klebsiella pneumoniae NOT DETECTED NOT DETECTED Final   Proteus species NOT DETECTED NOT DETECTED Final   Salmonella species NOT DETECTED NOT DETECTED Final   Serratia marcescens NOT DETECTED NOT DETECTED Final   Haemophilus influenzae NOT DETECTED NOT DETECTED Final   Neisseria meningitidis NOT DETECTED NOT  DETECTED Final   Pseudomonas aeruginosa NOT DETECTED NOT DETECTED Final   Stenotrophomonas maltophilia NOT DETECTED NOT DETECTED Final   Candida albicans NOT DETECTED NOT DETECTED Final   Candida auris NOT DETECTED NOT DETECTED Final   Candida glabrata NOT DETECTED NOT DETECTED Final   Candida krusei NOT DETECTED NOT DETECTED Final   Candida parapsilosis NOT DETECTED NOT DETECTED Final   Candida tropicalis NOT DETECTED NOT DETECTED Final   Cryptococcus neoformans/gattii NOT DETECTED NOT DETECTED Final   Meth resistant mecA/C and MREJ DETECTED (A) NOT DETECTED Final    Comment: CRITICAL RESULT CALLED TO, READ BACK BY AND VERIFIED WITH: PHARMD LYDIA CHEN AT 2045 ON 12/13/20 BY KJ Performed at [facility], [address], [place], [place] [address]   Urine culture     Status: Abnormal   Collection Time: 12/13/20 12:30 AM   Specimen: In/Out Cath Urine  Result Value Ref Range Status   Specimen Description IN/OUT CATH URINE  Final   Special Requests NONE  Final   Culture (A)  Final    <10,000 COLONIES/mL INSIGNIFICANT GROWTH Performed at [facility], [address] [place], [place] [address]    Report Status 12/14/2020 FINAL  Final  MRSA Next Gen by PCR, Nasal     Status: Abnormal   Collection Time: 12/14/20 11:31 AM   Specimen: Nasal Mucosa; Nasal Swab  Result Value Ref Range Status   MRSA by PCR Next Gen DETECTED (A) NOT DETECTED Final    Comment: RESULT CALLED TO, READ BACK BY AND VERIFIED WITH: Kennith Gain RN [id number] 12/14/20 A BROWNING (NOTE) The GeneXpert MRSA Assay (FDA approved for NASAL specimens only), is one component of a comprehensive MRSA colonization surveillance program. It is not intended to diagnose MRSA infection nor to guide or monitor treatment for MRSA infections. Test performance is not FDA approved in patients less than 89 years old. Performed at [facility], [address], [place], [place] [address]   Culture, blood (routine x 2)      Status: None (Preliminary result)   Collection Time: 12/15/20  6:54 AM   Specimen: BLOOD  Result Value Ref Range Status   Specimen Description BLOOD LEFT ANTECUBITAL  Final   Special Requests   Final    BOTTLES DRAWN AEROBIC AND ANAEROBIC Blood Culture results may not be optimal due to an excessive volume of blood received in culture bottles   Culture   Final    NO GROWTH 1 DAY Performed at [facility], [address] [place], [place] [address]    Report Status PENDING  Incomplete  Culture, blood (routine x 2)     Status: None (Preliminary result)   Collection Time: 12/15/20  6:54 AM   Specimen: BLOOD LEFT WRIST  Result Value Ref Range Status   Specimen Description BLOOD LEFT WRIST  Final   Special Requests   Final    BOTTLES DRAWN AEROBIC AND ANAEROBIC Blood Culture results may not be optimal due to an excessive volume of blood received in culture bottles   Culture   Final    NO GROWTH 1 DAY Performed at [facility], [place] [address] [place], [place] [address]    Report Status PENDING  Incomplete      Studies: No results found.  Scheduled Meds:  amLODipine  2.5 mg Oral q morning   aspirin EC  81 mg Oral q morning   atorvastatin  40 mg Oral QHS   enoxaparin (LOVENOX) injection  40 mg Subcutaneous Daily   feeding supplement  237 mL Oral TID BM   HYDROmorphone   Intravenous Q4H   icosapent Ethyl  2 g Oral BID   insulin aspart  0-15 Units Subcutaneous TID WC   lactulose  10 g Oral Daily   losartan  25 mg Oral q morning   metoprolol succinate  25 mg Oral QHS   multivitamin with minerals  1 tablet Oral Daily   nicotine  21 mg Transdermal Daily   pantoprazole  40 mg Oral Daily   sodium chloride flush  3 mL Intravenous Q12H   sorbitol, milk of mag, mineral oil, glycerin (SMOG) enema  960 mL Rectal Once   venlafaxine XR  75 mg Oral Q breakfast    Continuous Infusions:  sodium chloride     DAPTOmycin (CUBICIN)  IV 750 mg (12/15/20 1400)     LOS: 3 days      Kathie Dike, MD [facility]   12/16/2020, 12:00 PM

## 2020-12-16 NOTE — Progress Notes (Signed)
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 At this time no other obvious metastatic focus of infection Ortho spine had discussed case with primary team and no surgical intervention   Given this is mrsa bsi, no indication to sample vertebral body for targetted therapy   Duration of tx would be at least 8 weeks    6/23 assessment 6/22 tte unremarkable Significant pain still but no evidence or progression or presence of lumbar spondylosis 6/22 repeat bcx ngtd   Plan: Continue daptomycin If recurrent fever or sepsis, repeat bcx and will consider tee at that time Pain control per primary team Discussed with primary team  I have spent a total of 20 minutes of face-to-face and non-face-to-face time, excluding clinical staff time, preparing to see patient, ordering tests and/or medications, and provide counseling the patient   Principal Problem:   MRSA bacteremia Active Problems:   CAD (coronary artery disease)   Dyslipidemia   HTN (hypertension)   Uncontrolled diabetes mellitus (HCC)   Osteomyelitis (HCC)   Tachycardia   Overweight (BMI 25.0-29.9)   Constipated   Back pain   No Known Allergies  Scheduled  Meds:  amLODipine  2.5 mg Oral q morning   aspirin EC  81 mg Oral q morning   atorvastatin  40 mg Oral QHS   enoxaparin (LOVENOX) injection  40 mg Subcutaneous Daily   feeding supplement  237 mL Oral TID BM   HYDROmorphone   Intravenous Q4H   icosapent Ethyl  2 g Oral BID   insulin aspart  0-15 Units Subcutaneous TID WC   lactulose  10 g Oral Daily   losartan  25 mg Oral q morning   metoprolol succinate  25 mg Oral QHS   multivitamin with minerals  1 tablet Oral Daily   nicotine  21 mg Transdermal Daily   pantoprazole  40 mg Oral Daily   sodium chloride flush  3 mL Intravenous Q12H   sorbitol, milk of mag, mineral oil, glycerin (SMOG) enema  960 mL Rectal Once   venlafaxine XR  75 mg Oral Q breakfast   Continuous Infusions:  sodium chloride     DAPTOmycin (CUBICIN)  IV 750 mg (12/15/20 1400)   PRN Meds:.acetaminophen **OR** acetaminophen, ALPRAZolam, bisacodyl, diclofenac Sodium, diphenhydrAMINE **OR** diphenhydrAMINE, gabapentin, ketorolac, lidocaine, naloxone **AND** sodium chloride flush, polyethylene glycol, prochlorperazine, tiZANidine   SUBJECTIVE: Severe pain still not well controlled No fever Repeaat  bcx negatve Tte reviewed No LE neurologic deficit. No bowel/bladder incontinence  Review of Systems: ROS All other ROS was negative, except mentioned above     OBJECTIVE: Vitals:   12/15/20 0905 12/15/20 2134 12/16/20 0506 12/16/20 1002  BP: 109/76 93/76 94/71  101/74  Pulse: (!) 104 95 97 99  Resp: 20 20 18    Temp: 98 F (36.7 C) (!) 97.5 F (36.4 C) 98.4 F (36.9 C) 98.2 F (36.8 C)  TempSrc: Oral Oral Oral Oral  SpO2: 96% 96% 90% 91%  Weight:  94.1 kg    Height:       Body mass index is 28.93 kg/m.  Physical Exam General/constitutional: in distress complaining/moaning of back pain HEENT: Normocephalic, PER, Conj Clear, EOMI, Oropharynx clear Neck supple CV: rrr no mrg Lungs: clear to auscultation, normal respiratory effort Abd: Soft,  Nontender Ext: no edema Skin: No Rash Neuro: nonfocal MSK: lower mid back moderate tenderness  Central line presence: no   Lab Results Lab Results  Component Value Date   WBC 11.1 (H) 12/16/2020   HGB 15.1 12/16/2020   HCT 43.6 12/16/2020   MCV 86.7 12/16/2020   PLT 200 12/16/2020    Lab Results  Component Value Date   CREATININE 0.96 12/16/2020   BUN 31 (H) 12/16/2020   NA 129 (L) 12/16/2020   K 4.0 12/16/2020   CL 95 (L) 12/16/2020   CO2 24 12/16/2020    Lab Results  Component Value Date   ALT 40 12/13/2020   AST 44 (H) 12/13/2020   ALKPHOS 65 12/13/2020   BILITOT 1.9 (H) 12/13/2020      Microbiology: Recent Results (from the past 240 hour(s))  SARS CORONAVIRUS 2 (TAT 6-24 HRS) Nasopharyngeal Nasopharyngeal Swab     Status: None   Collection Time: 12/12/20 11:09 PM   Specimen: Nasopharyngeal Swab  Result Value Ref Range Status   SARS Coronavirus 2 NEGATIVE NEGATIVE Final    Comment: (NOTE) SARS-CoV-2 target nucleic acids are NOT DETECTED.  The SARS-CoV-2 RNA is generally detectable in upper and lower respiratory specimens during the acute phase of infection. Negative results do not preclude SARS-CoV-2 infection, do not rule out co-infections with other pathogens, and should not be used as the sole basis for treatment or other patient management decisions. Negative results must be combined with clinical observations, patient history, and epidemiological information. The expected result is Negative.  Fact Sheet for Patients: 12/14/20  Fact Sheet for Healthcare Providers: HairSlick.no  This test is not yet approved or cleared by the quierodirigir.com FDA and  has been authorized for detection and/or diagnosis of SARS-CoV-2 by FDA under an Emergency Use Authorization (EUA). This EUA will remain  in effect (meaning this test can be used) for the duration of the COVID-19 declaration under Se ction  564(b)(1) of the Act, 21 U.S.C. section 360bbb-3(b)(1), unless the authorization is terminated or revoked sooner.  Performed at [facility], [address], [place], [place]   Blood culture (routine single)     Status: Abnormal   Collection Time: 12/13/20 12:25 AM   Specimen: BLOOD LEFT FOREARM  Result Value Ref Range Status   Specimen Description BLOOD LEFT FOREARM  Final   Special Requests   Final    BOTTLES DRAWN AEROBIC AND ANAEROBIC Blood Culture results may not be optimal due to an inadequate volume of blood received in culture bottles   Culture  Setup Time   Final    GRAM POSITIVE COCCI IN BOTH AEROBIC  AND ANAEROBIC BOTTLES Organism ID to follow CRITICAL RESULT CALLED TO, READ BACK BY AND VERIFIED WITH: PHARMD LYDIA CHEN AT 2041 ON 12/13/20 BY KJ Performed at [facility], [address] [place], [place] [address]    Culture METHICILLIN RESISTANT STAPHYLOCOCCUS AUREUS (A)  Final   Report Status 12/15/2020 FINAL  Final   Organism ID, Bacteria METHICILLIN RESISTANT STAPHYLOCOCCUS AUREUS  Final      Susceptibility   Methicillin resistant staphylococcus aureus - MIC*    CIPROFLOXACIN >=8 RESISTANT Resistant     ERYTHROMYCIN >=8 RESISTANT Resistant     GENTAMICIN <=0.5 SENSITIVE Sensitive     OXACILLIN >=4 RESISTANT Resistant     TETRACYCLINE <=1 SENSITIVE Sensitive     VANCOMYCIN 1 SENSITIVE Sensitive     TRIMETH/SULFA 80 RESISTANT Resistant     CLINDAMYCIN <=0.25 SENSITIVE Sensitive     RIFAMPIN <=0.5 SENSITIVE Sensitive     Inducible Clindamycin NEGATIVE Sensitive     * METHICILLIN RESISTANT STAPHYLOCOCCUS AUREUS  Blood Culture ID Panel (Reflexed)     Status: Abnormal   Collection Time: 12/13/20 12:25 AM  Result Value Ref Range Status   Enterococcus faecalis NOT DETECTED NOT DETECTED Final   Enterococcus Faecium NOT DETECTED NOT DETECTED Final   Listeria monocytogenes NOT DETECTED NOT DETECTED Final   Staphylococcus species DETECTED (A) NOT  DETECTED Final    Comment: CRITICAL RESULT CALLED TO, READ BACK BY AND VERIFIED WITH: PHARMD LYDIA CHEN AT 2045 ON 12/13/20 BY KJ    Staphylococcus aureus (BCID) DETECTED (A) NOT DETECTED Final    Comment: Methicillin (oxacillin)-resistant Staphylococcus aureus (MRSA). MRSA is predictably resistant to beta-lactam antibiotics (except ceftaroline). Preferred therapy is vancomycin unless clinically contraindicated. Patient requires contact precautions if  hospitalized. CRITICAL RESULT CALLED TO, READ BACK BY AND VERIFIED WITH: PHARMD LYDIA CHEN AT 2045 ON 12/13/20 BY KJ    Staphylococcus epidermidis NOT DETECTED NOT DETECTED Final   Staphylococcus lugdunensis NOT DETECTED NOT DETECTED Final   Streptococcus species NOT DETECTED NOT DETECTED Final   Streptococcus agalactiae NOT DETECTED NOT DETECTED Final   Streptococcus pneumoniae NOT DETECTED NOT DETECTED Final   Streptococcus pyogenes NOT DETECTED NOT DETECTED Final   A.calcoaceticus-baumannii NOT DETECTED NOT DETECTED Final   Bacteroides fragilis NOT DETECTED NOT DETECTED Final   Enterobacterales NOT DETECTED NOT DETECTED Final   Enterobacter cloacae complex NOT DETECTED NOT DETECTED Final   Escherichia coli NOT DETECTED NOT DETECTED Final   Klebsiella aerogenes NOT DETECTED NOT DETECTED Final   Klebsiella oxytoca NOT DETECTED NOT DETECTED Final   Klebsiella pneumoniae NOT DETECTED NOT DETECTED Final   Proteus species NOT DETECTED NOT DETECTED Final   Salmonella species NOT DETECTED NOT DETECTED Final   Serratia marcescens NOT DETECTED NOT DETECTED Final   Haemophilus influenzae NOT DETECTED NOT DETECTED Final   Neisseria meningitidis NOT DETECTED NOT DETECTED Final   Pseudomonas aeruginosa NOT DETECTED NOT DETECTED Final   Stenotrophomonas maltophilia NOT DETECTED NOT DETECTED Final   Candida albicans NOT DETECTED NOT DETECTED Final   Candida auris NOT DETECTED NOT DETECTED Final   Candida glabrata NOT DETECTED NOT DETECTED Final    Candida krusei NOT DETECTED NOT DETECTED Final   Candida parapsilosis NOT DETECTED NOT DETECTED Final   Candida tropicalis NOT DETECTED NOT DETECTED Final   Cryptococcus neoformans/gattii NOT DETECTED NOT DETECTED Final   Meth resistant mecA/C and MREJ DETECTED (A) NOT DETECTED Final    Comment: CRITICAL RESULT CALLED TO, READ BACK BY AND VERIFIED WITH: PHARMD LYDIA CHEN  AT 2045 ON 12/13/20 BY KJ Performed at [facility], [address], [place], [place] [address]   Urine culture     Status: Abnormal   Collection Time: 12/13/20 12:30 AM   Specimen: In/Out Cath Urine  Result Value Ref Range Status   Specimen Description IN/OUT CATH URINE  Final   Special Requests NONE  Final   Culture (A)  Final    <10,000 COLONIES/mL INSIGNIFICANT GROWTH Performed at [facility], [address], [place], [place] [address]    Report Status 12/14/2020 FINAL  Final  MRSA Next Gen by PCR, Nasal     Status: Abnormal   Collection Time: 12/14/20 11:31 AM   Specimen: Nasal Mucosa; Nasal Swab  Result Value Ref Range Status   MRSA by PCR Next Gen DETECTED (A) NOT DETECTED Final    Comment: RESULT CALLED TO, READ BACK BY AND VERIFIED WITH: Kennith Gain RN [id number] 12/14/20 A BROWNING (NOTE) The GeneXpert MRSA Assay (FDA approved for NASAL specimens only), is one component of a comprehensive MRSA colonization surveillance program. It is not intended to diagnose MRSA infection nor to guide or monitor treatment for MRSA infections. Test performance is not FDA approved in patients less than 50 years old. Performed at [facility], [address], [place], [place] [address]   Culture, blood (routine x 2)     Status: None (Preliminary result)   Collection Time: 12/15/20  6:54 AM   Specimen: BLOOD  Result Value Ref Range Status   Specimen Description BLOOD LEFT ANTECUBITAL  Final   Special Requests   Final    BOTTLES DRAWN AEROBIC AND ANAEROBIC Blood Culture results may not be optimal due to an  excessive volume of blood received in culture bottles   Culture   Final    NO GROWTH 1 DAY Performed at [facility], [address] [place], [place] [address]    Report Status PENDING  Incomplete  Culture, blood (routine x 2)     Status: None (Preliminary result)   Collection Time: 12/15/20  6:54 AM   Specimen: BLOOD LEFT WRIST  Result Value Ref Range Status   Specimen Description BLOOD LEFT WRIST  Final   Special Requests   Final    BOTTLES DRAWN AEROBIC AND ANAEROBIC Blood Culture results may not be optimal due to an excessive volume of blood received in culture bottles   Culture   Final    NO GROWTH 1 DAY Performed at [facility], [address], [place], [place] [address]    Report Status PENDING  Incomplete     Serology:   Imaging: If present, new imagings (plain films, ct scans, and mri) have been personally visualized and interpreted; radiology reports have been reviewed. Decision making incorporated into the Impression / Recommendations.  6/22 tte  1. Left ventricular ejection fraction, by estimation, is 50 to 55%. The  left ventricle has low normal function. The left ventricle has no regional  wall motion abnormalities. There is moderate concentric left ventricular  hypertrophy. Left ventricular  diastolic parameters are consistent with Grade I diastolic dysfunction  (impaired relaxation).   2. Right ventricular systolic function is normal. The right ventricular  size is normal.   3. Left atrial size was mildly dilated.   4. The mitral valve is normal in structure. No evidence of mitral valve  regurgitation. No evidence of mitral stenosis.   5. The aortic valve is tricuspid. Aortic valve regurgitation is  not  visualized. No aortic stenosis is present.   6. The inferior vena cava is normal in size with greater than 50%  respiratory variability, suggesting right atrial pressure of 3 mmHg.  6/19 mri l-spine/abd 6/19 mri abd/l-spine 1. 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 No suspicious kidney lesions identified at this time. 2. Findings compatible with discitis/osteomyelitis involving the L3-4, L4-5 and L5-S1 levels. Surrounding edema and enhancement extends into the left psoas muscle. No discrete fluid collection identified to suggest abscess. See lumbar spine report from today.    Raymondo Band, MD [facility] [phone] pager    12/16/2020, 12:40 PM

## 2020-12-16 NOTE — Discharge Instructions (Signed)

## 2020-12-17 LAB — CBC
HCT: 43.7 % (ref 39.0–52.0)
Hemoglobin: 15.1 g/dL (ref 13.0–17.0)
MCH: 30.3 pg (ref 26.0–34.0)
MCHC: 34.6 g/dL (ref 30.0–36.0)
MCV: 87.8 fL (ref 80.0–100.0)
Platelets: 251 10*3/uL (ref 150–400)
RBC: 4.98 MIL/uL (ref 4.22–5.81)
RDW: 15.9 % — ABNORMAL HIGH (ref 11.5–15.5)
WBC: 13.6 10*3/uL — ABNORMAL HIGH (ref 4.0–10.5)
nRBC: 0 % (ref 0.0–0.2)

## 2020-12-17 LAB — COMPREHENSIVE METABOLIC PANEL
ALT: 38 U/L (ref 0–44)
AST: 78 U/L — ABNORMAL HIGH (ref 15–41)
Albumin: 2.4 g/dL — ABNORMAL LOW (ref 3.5–5.0)
Alkaline Phosphatase: 97 U/L (ref 38–126)
Anion gap: 14 (ref 5–15)
BUN: 22 mg/dL — ABNORMAL HIGH (ref 6–20)
CO2: 25 mmol/L (ref 22–32)
Calcium: 8.8 mg/dL — ABNORMAL LOW (ref 8.9–10.3)
Chloride: 96 mmol/L — ABNORMAL LOW (ref 98–111)
Creatinine, Ser: 0.84 mg/dL (ref 0.61–1.24)
GFR, Estimated: >60 mL/min (ref >60)
Glucose, Bld: 203 mg/dL — ABNORMAL HIGH (ref 70–99)
Potassium: 4.1 mmol/L (ref 3.5–5.1)
Sodium: 135 mmol/L (ref 135–145)
Total Bilirubin: 0.9 mg/dL (ref 0.3–1.2)
Total Protein: 5.7 g/dL — ABNORMAL LOW (ref 6.5–8.1)

## 2020-12-17 LAB — GLUCOSE, CAPILLARY
Glucose-Capillary: 224 mg/dL — ABNORMAL HIGH (ref 70–99)
Glucose-Capillary: 168 mg/dL — ABNORMAL HIGH (ref 70–99)
Glucose-Capillary: 159 mg/dL — ABNORMAL HIGH (ref 70–99)

## 2020-12-17 MED ORDER — DIPHENHYDRAMINE HCL 50 MG/ML IJ SOLN: 12.5000 mg | Freq: Four times a day (QID) | INTRAMUSCULAR | Status: DC | PRN

## 2020-12-17 MED ORDER — KETOROLAC TROMETHAMINE 15 MG/ML IJ SOLN
15.0000 mg | Freq: Four times a day (QID) | INTRAMUSCULAR | Status: DC
  Administered 2020-12-17 – 2020-12-18 (×4): 15 mg via INTRAVENOUS
  Filled 2020-12-17 (×4): qty 1

## 2020-12-17 MED ORDER — DIPHENHYDRAMINE HCL 12.5 MG/5ML PO ELIX: 12.5000 mg | ORAL_SOLUTION | Freq: Four times a day (QID) | ORAL | Status: DC | PRN

## 2020-12-17 MED ORDER — HYDROMORPHONE 1 MG/ML IV SOLN
INTRAVENOUS | Status: DC
  Administered 2020-12-17: 6 mg via INTRAVENOUS
  Administered 2020-12-18: 0 mg via INTRAVENOUS

## 2020-12-17 MED ORDER — ONDANSETRON HCL 4 MG/2ML IJ SOLN
4.0000 mg | Freq: Four times a day (QID) | INTRAMUSCULAR | Status: DC | PRN
  Administered 2020-12-17: 4 mg via INTRAVENOUS
  Filled 2020-12-17: qty 2

## 2020-12-17 MED ORDER — NALOXONE HCL 0.4 MG/ML IJ SOLN: 0.4000 mg | INTRAMUSCULAR | Status: DC | PRN

## 2020-12-17 MED ORDER — SODIUM CHLORIDE 0.9% FLUSH: 9.0000 mL | INTRAVENOUS | Status: DC | PRN

## 2020-12-17 NOTE — Plan of Care (Signed)
  Problem: Activity: Goal: Risk for activity intolerance will decrease Outcome: Progressing   Problem: Pain Managment: Goal: General experience of comfort will improve Outcome: Progressing   

## 2020-12-17 NOTE — Progress Notes (Addendum)
Inpatient Diabetes Program Recommendations  AACE/ADA: New Consensus Statement on Inpatient Glycemic Control   Target Ranges:  Prepandial:   less than 140 mg/dL      Peak postprandial:   less than 180 mg/dL (1-2 hours)      Critically ill patients:  140 - 180 mg/dL   Results for Louis [name], Louis [name] (MRN [id number]) as of 12/17/2020 09:36  Ref. Range 12/16/2020 06:45 12/16/2020 16:30 12/17/2020 07:39  Glucose-Capillary Latest Ref Range: 70 - 99 mg/dL 195 (H) 093 (H) 267 (H)    Review of Glycemic Control  Diabetes history: DM2 Outpatient Diabetes medications: Farxiga 10 QAM, Glipizide 5 mg BID, Metformin 1000 mg QAM, Metformin 500 mg QPM Current orders for Inpatient glycemic control: Novolog 0-15 units TID with meals   Inpatient Diabetes Program Recommendations:     Insulin: Please consider ordering Lantus 5 units Q24H.   HbgA1C:  A1C 9.1% on 12/13/20 indicating an average glucose of 214 mg/dl over the past 2-3 months.   Addendum 12/17/20@13 :30-Spoke with patient and his wife at bedside regarding DM control. Patient lying in bed repositioning frequently due to pain, also dry heaving, and reports he continues to be in a lot of pain and nauseous. Patient has PCA and his wife reports that he is also receiving nausea medication when needed. Patient able to participate in conversation. Patient states he takes his DM medications consistently but notes he does not check glucose at home and he eats things he shouldn't at times. Patient's wife prepares all his medications and she confirms that patient is consistently taking all DM medications as prescribed.  Stressed importance of getting DM under better control and discussed A1C of 9.1%.  Discussed A1C and glucose goals.  Discussed FreeStyle Libre and patient states he would consider using it if it was affordable and if it did not cause any skin issues (patient reports he has been having a lot of issues with MRSA recently).  Patient states he would be willing to take  insulin outpatient if prescribed but notes it would need to be affordable.  Discussed savings cards for insulin that can be found online to help reduce copay.  Patient states he has had to be out of work a lot over the past year so he needs affordable medications. Patient states his father uses insulin (vial/syringe) and he has seen him use it multiple times. Explained that insulin would be discussed and reviewed just in case he is prescribed insulin at time of discharge.  Patient asked that I discussed and demonstrate how to use insulin with his wife as he did not feel up to watching or demonstrating.  Educated patient's wife on insulin pen use. Reviewed all steps of insulin pen including attachment of needle, 2-unit air shot, dialing up dose, giving injection, removing needle, disposal of sharps, storage of unused insulin.  She was able to provide successful return demonstration. Also encouraged her and patient to look online to learn more about insulin administration and FreeStyle Libre. Patient's wife states she would feel comfortable with helping her husband take insulin at home if prescribed at discharge. Encouraged patient to be sure to follow up with his PCP regarding DM control and try to make dietary changes and follow Carb Modified diet. Patient and his wife verbalized understanding of information discussed and have no questions at this time.  Thanks, Orlando Penner, RN, MSN, CDE Diabetes Coordinator Inpatient Diabetes Program [phone] (Team Pager from 8am to 5pm)

## 2020-12-17 NOTE — Progress Notes (Signed)
Brief ID Progress Note:    Louis [name] is a 55 y.o. male with MRSA bacteremia, L3-4, L4-5 and L5-S1 osteomyelitis and left psoas myositis. Main problem has been sever pain a/w condition.   Continue daptomycin. CK yesterday 53. Statin reduced in hopes to avoid s/e from daptomycin associated r/f rhabdomyolysis.  No indication for TEE - WBC trending up, ? a/w severe pain. Remains afebrile. Will follow.   Would consider scheduled Toradol for multimodal pain control and reduction of inflammation. Moves all extremities and no neurologic changes; no need for repeat imaging at this point unless there is deterioration.    Care plan d/w Dr. Renold Don and ID pharm team.  Will see him back next week but Dr. Renold Don is available over the weekend for ID related questions.    Louis Alberts, MSN, NP-C [facility]  [email or web address] Pager: [phone] Office: [phone] [facility] Main Line: [phone]

## 2020-12-17 NOTE — Progress Notes (Signed)
PROGRESS NOTE  Compton Brigance GBT:[id number] DOB: [birth year] DOA: 12/12/2020 PCP: Pcp, No  HPI/Recap of past 41 hours: 55 year old male with past medical history of CAD, hypertension, uncontrolled diabetes mellitus who presented to the [facility] emergency room with left-sided back pain x2 days.  CT scan unremarkable, but MRI noted osteomyelitis/discitis at L3-S1 and patient was transferred to [facility] to the hospitalist service on 6/19 given lack of infectious disease and neurosurgical coverage in [place].  Patient also noted to have MRSA bacteremia and a left psoas myositis but no discrete fluid collection.  Started on vancomycin. EDP discussed case with neurosurgery who recommended no surgical indications at this time.  Infectious disease evaluated patient recommending TTE to rule out endocarditis and depending on clearance of bacteremia, if TEE warranted.  Patient continues to complain of severe lower back pain.  No relief with p.o. opiates or lidocaine patch.  Also notes that he has not had a bowel movement since being transferred to [facility].  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 Sepsis was present on admission, since has stabilized.  Repeat blood cultures drawn on 6/22 and are currently in process.  TTE done without signs of vegetations. ID following. Currently on daptomycin. Active Problems:   CAD (coronary artery disease): Stable   Dyslipidemia   HTN (hypertension): Blood pressure now better controlled.    Uncontrolled diabetes mellitus ([place]): A1c greater than 9.  Advised the patient is better controlled.    Overweight (BMI 25.0-29.9): Meets criteria BMI greater  than 25    Constipated: CT abdomen negative for SBO. Patient had large bowel movement after CT. Continue on daily lactulose    Back pain: uncontrolled despite fentanyl and dilaudid. Currently on dilaudid PCA for pain control. Overall pain control is better. Will change to reduced dose PCA with the hopes of weaning him off. Will add toradol  Acute encephalopathy: suspect this is related to uncontrolled pain and opiates. ABG, ammonia, UA, CT head unremarkable. Overall mental status has improved  Hyponatremia. Improved with saline infusion   Code Status: Full code  Family Communication: discussed with wife at the bedside  Disposition Plan: Home once back pain controlled, blood cultures cleared and can place PICC And decide on duration of antibiotics.  Hopefully will not need TEE   Consultants: Case discussed with neurosurgery Infectious disease  Procedures: 2D echo: no evidence of vegetations  Antimicrobials: IV vancomycin 6/19-6/22 Daptomycin 6/22>  DVT prophylaxis: Lovenox  Level of care: Telemetry Medical   Objective: Vitals:   12/17/20 1104 12/17/20 1700  BP:    Pulse:    Resp: 20 20  Temp:    SpO2: 96% 95%    Intake/Output Summary (Last 24 hours) at 12/17/2020 1720 Last data filed at 12/17/2020 1300 Gross per 24 hour  Intake 700 ml  Output 400 ml  Net 300 ml   Filed Weights   12/14/20 0800 12/14/20 2315 12/15/20 2134  Weight: 94.1 kg 93.9 kg 94.1 kg   Body mass index is 28.93 kg/m.  Exam:  General exam: Alert, awake, oriented x 3 Respiratory system: Clear to auscultation. Respiratory effort normal. Cardiovascular system:RRR. No murmurs, rubs, gallops. Gastrointestinal system: Abdomen is nondistended, soft and nontender. No organomegaly or masses felt. Normal bowel sounds heard.  Central nervous system: Alert and oriented. No focal neurological deficits. Extremities: No C/C/E, +pedal pulses Skin: No rashes, lesions or ulcers Psychiatry: Judgement and  insight appear normal. Mood & affect appropriate.    Data Reviewed: CBC: Recent Labs  Lab 12/13/20 0046 12/14/20 0347 12/15/20 0654 12/16/20 0552 12/17/20 0304  WBC 12.0* 8.9 8.4 11.1* 13.6*  HGB 15.2 14.3 14.1 15.1 15.1  HCT 45.7 42.6 41.9 43.6 43.7  MCV 91.6 89.7 88.4 86.7 87.8  PLT 178 175 179 200 025   Basic Metabolic Panel: Recent Labs  Lab 12/13/20 0046 12/14/20 0347 12/15/20 0654 12/16/20 0552 12/17/20 0304  NA 127* 127* 128* 129* 135  K 4.0 4.0 4.1 4.0 4.1  CL 96* 93* 95* 95* 96*  CO2 17* $Remov'22 24 24 25  'KGgWHB$ GLUCOSE 188* 173* 205* 250* 203*  BUN $Re'15 17 20 'FyE$ 31* 22*  CREATININE 1.09 1.06 0.77 0.96 0.84  CALCIUM 8.1* 8.5* 8.6* 8.9 8.8*   GFR: Estimated Creatinine Clearance: 113.6 mL/min (by C-G formula based on SCr of 0.84 mg/dL). Liver Function Tests: Recent Labs  Lab 12/13/20 0046 12/17/20 0304  AST 44* 78*  ALT 40 38  ALKPHOS 65 97  BILITOT 1.9* 0.9  PROT 5.9* 5.7*  ALBUMIN 2.9* 2.4*   No results for input(s): LIPASE, AMYLASE in the last 168 hours. Recent Labs  Lab 12/16/20 1207  AMMONIA 27   Coagulation Profile: Recent Labs  Lab 12/12/20 0025  INR 1.3*   Cardiac Enzymes: Recent Labs  Lab 12/16/20 0552  CKTOTAL 53   BNP (last 3 results) No results for input(s): PROBNP in the last 8760 hours. HbA1C: No results for input(s): HGBA1C in the last 72 hours.  CBG: Recent Labs  Lab 12/16/20 0645 12/16/20 1630 12/17/20 0739 12/17/20 1147 12/17/20 1708  GLUCAP 269* 240* 224* 168* 159*   Lipid Profile: No results for input(s): CHOL, HDL, LDLCALC, TRIG, CHOLHDL, LDLDIRECT in the last 72 hours. Thyroid Function Tests: No results for input(s): TSH, T4TOTAL, FREET4, T3FREE, THYROIDAB in the last 72 hours. Anemia Panel: No results for input(s): VITAMINB12, FOLATE, FERRITIN, TIBC, IRON, RETICCTPCT in the last 72 hours. Urine analysis:    Component Value Date/Time   COLORURINE YELLOW 12/16/2020 1615   APPEARANCEUR CLEAR 12/16/2020 1615    LABSPEC 1.018 12/16/2020 1615   PHURINE 6.0 12/16/2020 1615   GLUCOSEU >=500 (A) 12/16/2020 1615   HGBUR LARGE (A) 12/16/2020 1615   BILIRUBINUR NEGATIVE 12/16/2020 1615   KETONESUR 20 (A) 12/16/2020 1615   PROTEINUR 30 (A) 12/16/2020 1615   NITRITE NEGATIVE 12/16/2020 1615   LEUKOCYTESUR NEGATIVE 12/16/2020 1615   Sepsis Labs: $RemoveBefo'@LABRCNTIP'rzclBbldmdu$ (procalcitonin:4,lacticidven:4)  ) Recent Results (from the past 240 hour(s))  SARS CORONAVIRUS 2 (TAT 6-24 HRS) Nasopharyngeal Nasopharyngeal Swab     Status: None   Collection Time: 12/12/20 11:09 PM   Specimen: Nasopharyngeal Swab  Result Value Ref Range Status   SARS Coronavirus 2 NEGATIVE NEGATIVE Final    Comment: (NOTE) SARS-CoV-2 target nucleic acids are NOT DETECTED.  The SARS-CoV-2 RNA is generally detectable in upper and lower respiratory specimens during the acute phase of infection. Negative results do not preclude SARS-CoV-2 infection, do not rule out co-infections with other pathogens, and should not be used as the sole basis for treatment or other patient management decisions. Negative results must be combined with clinical observations, patient history, and epidemiological information. The expected result is Negative.  Fact Sheet for Patients: SugarRoll.be  Fact Sheet for Healthcare Providers: https://www.woods-mathews.com/  This test is not yet approved  or cleared by the [place] and  has been authorized for detection and/or diagnosis of SARS-CoV-2 by FDA under an Emergency Use Authorization (EUA). This EUA will remain  in effect (meaning this test can be used) for the duration of the COVID-19 declaration under Se ction 564(b)(1) of the Act, 21 U.S.C. section 360bbb-3(b)(1), unless the authorization is terminated or revoked sooner.  Performed at [facility], [place] [address], [place], [place] [address]   Blood culture (routine single)     Status: Abnormal    Collection Time: 12/13/20 12:25 AM   Specimen: BLOOD LEFT FOREARM  Result Value Ref Range Status   Specimen Description BLOOD LEFT FOREARM  Final   Special Requests   Final    BOTTLES DRAWN AEROBIC AND ANAEROBIC Blood Culture results may not be optimal due to an inadequate volume of blood received in culture bottles   Culture  Setup Time   Final    GRAM POSITIVE COCCI IN BOTH AEROBIC AND ANAEROBIC BOTTLES Organism ID to follow CRITICAL RESULT CALLED TO, READ BACK BY AND VERIFIED WITH: PHARMD LYDIA CHEN AT 2041 ON 12/13/20 BY KJ Performed at [facility], [place] [address] [place], [place] [address]    Culture METHICILLIN RESISTANT STAPHYLOCOCCUS AUREUS (A)  Final   Report Status 12/15/2020 FINAL  Final   Organism ID, Bacteria METHICILLIN RESISTANT STAPHYLOCOCCUS AUREUS  Final      Susceptibility   Methicillin resistant staphylococcus aureus - MIC*    CIPROFLOXACIN >=8 RESISTANT Resistant     ERYTHROMYCIN >=8 RESISTANT Resistant     GENTAMICIN <=0.5 SENSITIVE Sensitive     OXACILLIN >=4 RESISTANT Resistant     TETRACYCLINE <=1 SENSITIVE Sensitive     VANCOMYCIN 1 SENSITIVE Sensitive     TRIMETH/SULFA 80 RESISTANT Resistant     CLINDAMYCIN <=0.25 SENSITIVE Sensitive     RIFAMPIN <=0.5 SENSITIVE Sensitive     Inducible Clindamycin NEGATIVE Sensitive     * METHICILLIN RESISTANT STAPHYLOCOCCUS AUREUS  Blood Culture ID Panel (Reflexed)     Status: Abnormal   Collection Time: 12/13/20 12:25 AM  Result Value Ref Range Status   Enterococcus faecalis NOT DETECTED NOT DETECTED Final   Enterococcus Faecium NOT DETECTED NOT DETECTED Final   Listeria monocytogenes NOT DETECTED NOT DETECTED Final   Staphylococcus species DETECTED (A) NOT DETECTED Final    Comment: CRITICAL RESULT CALLED TO, READ BACK BY AND VERIFIED WITH: PHARMD LYDIA CHEN AT 2045 ON 12/13/20 BY KJ    Staphylococcus aureus (BCID) DETECTED (A) NOT DETECTED Final    Comment: Methicillin (oxacillin)-resistant Staphylococcus  aureus (MRSA). MRSA is predictably resistant to beta-lactam antibiotics (except ceftaroline). Preferred therapy is vancomycin unless clinically contraindicated. Patient requires contact precautions if  hospitalized. CRITICAL RESULT CALLED TO, READ BACK BY AND VERIFIED WITH: PHARMD LYDIA CHEN AT 2045 ON 12/13/20 BY KJ    Staphylococcus epidermidis NOT DETECTED NOT DETECTED Final   Staphylococcus lugdunensis NOT DETECTED NOT DETECTED Final   Streptococcus species NOT DETECTED NOT DETECTED Final   Streptococcus agalactiae NOT DETECTED NOT DETECTED Final   Streptococcus pneumoniae NOT DETECTED NOT DETECTED Final   Streptococcus pyogenes NOT DETECTED NOT DETECTED Final   A.calcoaceticus-baumannii NOT DETECTED NOT DETECTED Final   Bacteroides fragilis NOT DETECTED NOT DETECTED Final   Enterobacterales NOT DETECTED NOT DETECTED Final   Enterobacter cloacae complex NOT DETECTED NOT DETECTED Final   Escherichia coli NOT DETECTED NOT DETECTED Final   Klebsiella aerogenes NOT DETECTED NOT DETECTED Final   Klebsiella  oxytoca NOT DETECTED NOT DETECTED Final   Klebsiella pneumoniae NOT DETECTED NOT DETECTED Final   Proteus species NOT DETECTED NOT DETECTED Final   Salmonella species NOT DETECTED NOT DETECTED Final   Serratia marcescens NOT DETECTED NOT DETECTED Final   Haemophilus influenzae NOT DETECTED NOT DETECTED Final   Neisseria meningitidis NOT DETECTED NOT DETECTED Final   Pseudomonas aeruginosa NOT DETECTED NOT DETECTED Final   Stenotrophomonas maltophilia NOT DETECTED NOT DETECTED Final   Candida albicans NOT DETECTED NOT DETECTED Final   Candida auris NOT DETECTED NOT DETECTED Final   Candida glabrata NOT DETECTED NOT DETECTED Final   Candida krusei NOT DETECTED NOT DETECTED Final   Candida parapsilosis NOT DETECTED NOT DETECTED Final   Candida tropicalis NOT DETECTED NOT DETECTED Final   Cryptococcus neoformans/gattii NOT DETECTED NOT DETECTED Final   Meth resistant mecA/C and MREJ  DETECTED (A) NOT DETECTED Final    Comment: CRITICAL RESULT CALLED TO, READ BACK BY AND VERIFIED WITH: PHARMD LYDIA CHEN AT 2045 ON 12/13/20 BY KJ Performed at [facility], [address], [place], [place] [address]   Urine culture     Status: Abnormal   Collection Time: 12/13/20 12:30 AM   Specimen: In/Out Cath Urine  Result Value Ref Range Status   Specimen Description IN/OUT CATH URINE  Final   Special Requests NONE  Final   Culture (A)  Final    <10,000 COLONIES/mL INSIGNIFICANT GROWTH Performed at [facility], [place] [address] [place], [place] [address]    Report Status 12/14/2020 FINAL  Final  MRSA Next Gen by PCR, Nasal     Status: Abnormal   Collection Time: 12/14/20 11:31 AM   Specimen: Nasal Mucosa; Nasal Swab  Result Value Ref Range Status   MRSA by PCR Next Gen DETECTED (A) NOT DETECTED Final    Comment: RESULT CALLED TO, READ BACK BY AND VERIFIED WITH: Tawni Levy RN [id number] 12/14/20 A BROWNING (NOTE) The GeneXpert MRSA Assay (FDA approved for NASAL specimens only), is one component of a comprehensive MRSA colonization surveillance program. It is not intended to diagnose MRSA infection nor to guide or monitor treatment for MRSA infections. Test performance is not FDA approved in patients less than 34 years old. Performed at [facility], [place] [address] [place], [place] [address]   Culture, blood (routine x 2)     Status: None (Preliminary result)   Collection Time: 12/15/20  6:54 AM   Specimen: BLOOD  Result Value Ref Range Status   Specimen Description BLOOD LEFT ANTECUBITAL  Final   Special Requests   Final    BOTTLES DRAWN AEROBIC AND ANAEROBIC Blood Culture results may not be optimal due to an excessive volume of blood received in culture bottles   Culture   Final    NO GROWTH 2 DAYS Performed at [facility], [place] [address], [place], [place] [address]    Report Status PENDING  Incomplete  Culture, blood (routine x 2)     Status: None  (Preliminary result)   Collection Time: 12/15/20  6:54 AM   Specimen: BLOOD LEFT WRIST  Result Value Ref Range Status   Specimen Description BLOOD LEFT WRIST  Final   Special Requests   Final    BOTTLES DRAWN AEROBIC AND ANAEROBIC Blood Culture results may not be optimal due to an excessive volume of blood received in culture bottles   Culture   Final    NO GROWTH 2 DAYS Performed at  [facility], [place] [address] [place], [place] [address]    Report Status PENDING  Incomplete      Studies: CT HEAD WO CONTRAST  Result Date: 12/16/2020 CLINICAL DATA:  Altered mental status EXAM: CT HEAD WITHOUT CONTRAST TECHNIQUE: Contiguous axial images were obtained from the base of the skull through the vertex without intravenous contrast. COMPARISON:  None. FINDINGS: Brain: No evidence of acute infarction, hemorrhage, hydrocephalus, extra-axial collection or mass lesion/mass effect. Vascular: No hyperdense vessel or unexpected calcification. Skull: Normal. Negative for fracture or focal lesion. Sinuses/Orbits: Paranasal sinuses demonstrate mild mucosal thickening. Orbits and their contents are within normal limits. Other: None. IMPRESSION: Mild mucosal thickening in the paranasal sinuses. No acute abnormality noted. Electronically Signed   By: Inez Catalina M.D.   On: 12/16/2020 20:43   CT ABDOMEN PELVIS W CONTRAST  Result Date: 12/16/2020 CLINICAL DATA:  Abdominal distension Nausea/vomiting Bowel obstruction suspected Abdominal pain, acute, nonlocalized EXAM: CT ABDOMEN AND PELVIS WITH CONTRAST TECHNIQUE: Multidetector CT imaging of the abdomen and pelvis was performed using the standard protocol following bolus administration of intravenous contrast. CONTRAST:  127mL OMNIPAQUE IOHEXOL 300 MG/ML  SOLN COMPARISON:  Noncontrast CT 5 days ago 12/11/2020. Abdominal MRI 12/12/2020 FINDINGS: Lower chest: Trace right pleural effusion. No focal airspace disease. Hepatobiliary: No focal liver abnormality is  seen. No gallstones, gallbladder wall thickening, or biliary dilatation. Pancreas: No ductal dilatation or inflammation. Spleen: Normal in size without focal abnormality. Adrenals/Urinary Tract: No adrenal nodule. No hydronephrosis. Similar bilateral perinephric edema. No renal calculi. Cyst arising from the lower left kidney with peripheral calcifications measures 4.7 cm. 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 No new renal abnormality. Unremarkable urinary bladder. Stomach/Bowel: Minimal fluid in the distal esophagus. Stomach physiologically distended without abnormal gastric distension. There are occasional fluid-filled loops of small bowel without abnormal distension, wall thickening or inflammation. No small bowel obstruction. Normal appendix. Mild colonic distension with air and stool. Mild distal colonic diverticulosis. No diverticulitis, colonic wall thickening, or acute colonic inflammation. There is no colonic obstruction. Vascular/Lymphatic: Moderate aortic atherosclerosis. No aortic aneurysm. Patent portal vein. There are small retroperitoneal lymph nodes not enlarged by size criteria. No inguinal or pelvic adenopathy. Reproductive: Prostatic calcifications. Other: No abdominopelvic ascites. No free air or focal fluid collection. Small fat containing umbilical hernia. There is mild body wall edema. Musculoskeletal: Findings of discitis osteomyelitis at L3-L4, L4-L5, and L5-S1 were better appreciated on prior lumbar MRI. There is endplate sclerosis at K5-L9 and L5-S1 that appears similar, slight increase endplate irregularity at L3-L4 disc space from prior. Left psoas inflammatory change with enlargement and low density. There is a small 1.4 x 2.2 cm collection within the muscle, series 3, image 52 and series 6, image 59, with small foci of internal air and faint peripheral enhancement. Anterior paraspinal soft tissue thickening at L3-L4 level. IMPRESSION: 1. Findings consistent with  discitis osteomyelitis at L3-L4, L4-L5, and L5-S1 were better appreciated on prior lumbar MRI. Slight increase in endplate irregularity at the L3-L4 level. Left psoas myositis. There is a small ill-defined 1.4 x 2.2 cm collection within the left psoas muscle with small foci of internal air and faint peripheral enhancement, suspicious for phlegmon/developing abscess. 2. No bowel obstruction or other acute abnormality in the abdomen/pelvis. 3. Mild distal colonic diverticulosis without diverticulitis. Mild colonic distension with air and stool but no inflammatory change or obstruction. 4. Trace right pleural effusion. 5. 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 Aortic Atherosclerosis (ICD10-I70.0). Electronically Signed   By: Keith Rake M.D.   On: 12/16/2020 20:48    Scheduled Meds:  amLODipine  2.5 mg Oral q morning   aspirin EC  81 mg Oral q morning   atorvastatin  40 mg Oral QHS   enoxaparin (LOVENOX) injection  40 mg Subcutaneous Daily   feeding supplement  237 mL Oral TID BM   HYDROmorphone   Intravenous Q4H   icosapent Ethyl  2 g Oral BID   insulin aspart  0-15 Units Subcutaneous TID WC   lactulose  10 g Oral Daily   losartan  25 mg Oral q morning   metoprolol succinate  25 mg Oral QHS   multivitamin with minerals  1 tablet Oral Daily   nicotine  21 mg Transdermal Daily   pantoprazole  40 mg Oral Daily   sodium chloride flush  3 mL Intravenous Q12H   sorbitol, milk of mag, mineral oil, glycerin (SMOG) enema  960 mL Rectal Once   venlafaxine XR  75 mg Oral Q breakfast    Continuous Infusions:  sodium chloride 100 mL/hr at 12/17/20 1113   DAPTOmycin (CUBICIN)  IV 750 mg (12/16/20 2146)     LOS: 4 days     Kathie Dike, MD [facility]   12/17/2020, 5:20 PM

## 2020-12-18 DIAGNOSIS — M4620 Osteomyelitis of vertebra, site unspecified: Secondary | ICD-10-CM

## 2020-12-18 LAB — CBC
HCT: 40.6 % (ref 39.0–52.0)
Hemoglobin: 14 g/dL (ref 13.0–17.0)
MCH: 30 pg (ref 26.0–34.0)
MCHC: 34.5 g/dL (ref 30.0–36.0)
MCV: 87.1 fL (ref 80.0–100.0)
Platelets: 293 10*3/uL (ref 150–400)
RBC: 4.66 MIL/uL (ref 4.22–5.81)
RDW: 16 % — ABNORMAL HIGH (ref 11.5–15.5)
WBC: 15.7 10*3/uL — ABNORMAL HIGH (ref 4.0–10.5)
nRBC: 0 % (ref 0.0–0.2)

## 2020-12-18 LAB — BASIC METABOLIC PANEL
Anion gap: 14 (ref 5–15)
BUN: 16 mg/dL (ref 6–20)
CO2: 19 mmol/L — ABNORMAL LOW (ref 22–32)
Calcium: 8.3 mg/dL — ABNORMAL LOW (ref 8.9–10.3)
Chloride: 100 mmol/L (ref 98–111)
Creatinine, Ser: 0.85 mg/dL (ref 0.61–1.24)
GFR, Estimated: >60 mL/min (ref >60)
Glucose, Bld: 168 mg/dL — ABNORMAL HIGH (ref 70–99)
Potassium: 3.7 mmol/L (ref 3.5–5.1)
Sodium: 133 mmol/L — ABNORMAL LOW (ref 135–145)

## 2020-12-18 LAB — GLUCOSE, CAPILLARY
Glucose-Capillary: 193 mg/dL — ABNORMAL HIGH (ref 70–99)
Glucose-Capillary: 216 mg/dL — ABNORMAL HIGH (ref 70–99)

## 2020-12-18 MED ORDER — HYDROCODONE-ACETAMINOPHEN 5-325 MG PO TABS
2.0000 | ORAL_TABLET | Freq: Four times a day (QID) | ORAL | Status: DC
  Administered 2020-12-18 – 2020-12-23 (×17): 2 via ORAL
  Filled 2020-12-18 (×17): qty 2

## 2020-12-18 MED ORDER — HYDROCODONE-ACETAMINOPHEN 5-325 MG PO TABS: 1.0000 | ORAL_TABLET | ORAL | Status: DC | PRN

## 2020-12-18 MED ORDER — LORAZEPAM 2 MG/ML IJ SOLN
1.0000 mg | Freq: Four times a day (QID) | INTRAMUSCULAR | Status: DC | PRN
  Administered 2020-12-18 – 2020-12-19 (×2): 1 mg via INTRAVENOUS
  Filled 2020-12-18 (×2): qty 1

## 2020-12-18 MED ORDER — HYDROMORPHONE HCL 1 MG/ML IJ SOLN
1.0000 mg | INTRAMUSCULAR | Status: DC | PRN
  Administered 2020-12-18 – 2020-12-21 (×10): 1 mg via INTRAVENOUS
  Filled 2020-12-18 (×9): qty 1

## 2020-12-18 MED ORDER — GABAPENTIN 100 MG PO CAPS
200.0000 mg | ORAL_CAPSULE | Freq: Three times a day (TID) | ORAL | Status: DC
  Administered 2020-12-18 – 2020-12-26 (×22): 200 mg via ORAL
  Filled 2020-12-18 (×22): qty 2

## 2020-12-18 MED ORDER — HYDROMORPHONE HCL 1 MG/ML IJ SOLN
INTRAMUSCULAR | Status: AC
  Filled 2020-12-18: qty 1

## 2020-12-18 NOTE — Progress Notes (Signed)
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 At this time no other obvious metastatic focus of infection Ortho spine had discussed case with primary team and no surgical intervention   Given this is mrsa bsi, no indication to sample vertebral body for targetted therapy   Duration of tx would be at least 8 weeks    6/25 assessment 6/22 tte unremarkable Pca placed 6/23 but removed 6/24. Patient's pain is the best controlled today His mentation is also best today Wbc up unclear significance 6/22 repeat bcx ngtd  Given the past few days confusion (?pain/narcotics/sepsis) and rising wbc, was going to scan his brain and abd/pelv but given how he clinically looks today, will continue trending and monitor. He did have 6/23 abd/pelv ct with contrast and there was no new intraabd process  Plan: Continue daptomycin Trend weekly cpk while on dapto If continue rising wbc and recurrent/persistent encephalopathy will do brain mri and consider tagged wbc scan Continue pain management per primary team  I spent more than 35 minute reviewing data/chart, and coordinating care and >50% direct face to face time providing counseling/discussing  diagnostics/treatment plan with patient   Principal Problem:   MRSA bacteremia Active Problems:   CAD (coronary artery disease)   Dyslipidemia   HTN (hypertension)   Uncontrolled diabetes mellitus (HCC)   Vertebral osteomyelitis, acute (HCC)   Tachycardia   Overweight (BMI 25.0-29.9)   Constipated   Back pain   No Known Allergies  Scheduled Meds:  amLODipine  2.5 mg Oral q morning   aspirin EC  81 mg Oral q morning   atorvastatin  40 mg Oral QHS   enoxaparin (LOVENOX) injection  40 mg Subcutaneous Daily   feeding supplement  237 mL Oral TID BM   icosapent Ethyl  2 g Oral BID   insulin aspart  0-15 Units Subcutaneous TID WC   ketorolac  15 mg Intravenous Q6H   lactulose  10 g Oral Daily   losartan  25 mg Oral q morning   metoprolol succinate  25 mg Oral QHS   multivitamin with minerals  1 tablet Oral Daily   nicotine  21 mg Transdermal Daily   pantoprazole  40 mg Oral Daily   sodium chloride flush  3 mL Intravenous Q12H   sorbitol, milk of mag, mineral oil, glycerin (SMOG) enema  960 mL Rectal Once   venlafaxine XR  75 mg Oral Q  breakfast   Continuous Infusions:  DAPTOmycin (CUBICIN)  IV 750 mg (12/17/20 2021)   PRN Meds:.acetaminophen **OR** acetaminophen, ALPRAZolam, bisacodyl, diclofenac Sodium, gabapentin, HYDROcodone-acetaminophen, HYDROmorphone (DILAUDID) injection, lidocaine, polyethylene glycol, prochlorperazine, tiZANidine   SUBJECTIVE: Severe pain still not well controlled No fever Repeaat bcx negatve Tte reviewed No LE neurologic deficit. No bowel/bladder incontinence  Review of Systems: ROS All other ROS was negative, except mentioned above     OBJECTIVE: Vitals:   12/18/20 0524 12/18/20 0541 12/18/20 0800 12/18/20 1017  BP: (!) 159/89   (!) 183/87  Pulse: (!) 102   (!) 106  Resp: Temp: 97.6 F (36.4 C)   98 F (36.7 C)  TempSrc: Oral   Oral  SpO2: 98% 98% 98%   Weight:      Height:       Body mass index is 28.93  kg/m.  Physical Exam General/constitutional: in distress complaining/moaning of back pain HEENT: Normocephalic, PER, Conj Clear, EOMI, Oropharynx clear Neck supple CV: rrr no mrg Lungs: clear to auscultation, normal respiratory effort Abd: Soft, Nontender Ext: no edema Skin: No Rash Neuro: nonfocal MSK: lower mid back moderate tenderness  Central line presence: no   Lab Results Lab Results  Component Value Date   WBC 15.7 (H) 12/18/2020   HGB 14.0 12/18/2020   HCT 40.6 12/18/2020   MCV 87.1 12/18/2020   PLT 293 12/18/2020    Lab Results  Component Value Date   CREATININE 0.85 12/18/2020   BUN 16 12/18/2020   NA 133 (L) 12/18/2020   K 3.7 12/18/2020   CL 100 12/18/2020   CO2 19 (L) 12/18/2020    Lab Results  Component Value Date   ALT 38 12/17/2020   AST 78 (H) 12/17/2020   ALKPHOS 97 12/17/2020   BILITOT 0.9 12/17/2020      Microbiology: Recent Results (from the past 240 hour(s))  SARS CORONAVIRUS 2 (TAT 6-24 HRS) Nasopharyngeal Nasopharyngeal Swab     Status: None   Collection Time: 12/12/20 11:09 PM   Specimen: Nasopharyngeal Swab  Result Value Ref Range Status   SARS Coronavirus 2 NEGATIVE NEGATIVE Final    Comment: (NOTE) SARS-CoV-2 target nucleic acids are NOT DETECTED.  The SARS-CoV-2 RNA is generally detectable in upper and lower respiratory specimens during the acute phase of infection. Negative results do not preclude SARS-CoV-2 infection, do not rule out co-infections with other pathogens, and should not be used as the sole basis for treatment or other patient management decisions. Negative results must be combined with clinical observations, patient history, and epidemiological information. The expected result is Negative.  Fact Sheet for Patients: HairSlick.no  Fact Sheet for Healthcare Providers: quierodirigir.com  This test is not yet approved or cleared by the [place] FDA  and  has been authorized for detection and/or diagnosis of SARS-CoV-2 by FDA under an Emergency Use Authorization (EUA). This EUA will remain  in effect (meaning this test can be used) for the duration of the COVID-19 declaration under Se ction 564(b)(1) of the Act, 21 U.S.C. section 360bbb-3(b)(1), unless the authorization is terminated or revoked sooner.  Performed at [facility], [address] [place], [place] [address]   Blood culture (routine single)     Status: Abnormal   Collection Time: 12/13/20 12:25 AM   Specimen: BLOOD LEFT FOREARM  Result Value Ref Range Status   Specimen Description BLOOD LEFT FOREARM  Final   Special Requests   Final    BOTTLES DRAWN  AEROBIC AND ANAEROBIC Blood Culture results may not be optimal due to an inadequate volume of blood received in culture bottles   Culture  Setup Time   Final    GRAM POSITIVE COCCI IN BOTH AEROBIC AND ANAEROBIC BOTTLES Organism ID to follow CRITICAL RESULT CALLED TO, READ BACK BY AND VERIFIED WITH: PHARMD LYDIA CHEN AT 2041 ON 12/13/20 BY KJ Performed at [facility], [address] [place], [place] [address]    Culture METHICILLIN RESISTANT STAPHYLOCOCCUS AUREUS (A)  Final   Report Status 12/15/2020 FINAL  Final   Organism ID, Bacteria METHICILLIN RESISTANT STAPHYLOCOCCUS AUREUS  Final      Susceptibility   Methicillin resistant staphylococcus aureus - MIC*    CIPROFLOXACIN >=8 RESISTANT Resistant     ERYTHROMYCIN >=8 RESISTANT Resistant     GENTAMICIN <=0.5 SENSITIVE Sensitive     OXACILLIN >=4 RESISTANT Resistant     TETRACYCLINE <=1 SENSITIVE Sensitive     VANCOMYCIN 1 SENSITIVE Sensitive     TRIMETH/SULFA 80 RESISTANT Resistant     CLINDAMYCIN <=0.25 SENSITIVE Sensitive     RIFAMPIN <=0.5 SENSITIVE Sensitive     Inducible Clindamycin NEGATIVE Sensitive     * METHICILLIN RESISTANT STAPHYLOCOCCUS AUREUS  Blood Culture ID Panel (Reflexed)     Status: Abnormal   Collection Time: 12/13/20 12:25 AM   Result Value Ref Range Status   Enterococcus faecalis NOT DETECTED NOT DETECTED Final   Enterococcus Faecium NOT DETECTED NOT DETECTED Final   Listeria monocytogenes NOT DETECTED NOT DETECTED Final   Staphylococcus species DETECTED (A) NOT DETECTED Final    Comment: CRITICAL RESULT CALLED TO, READ BACK BY AND VERIFIED WITH: PHARMD LYDIA CHEN AT 2045 ON 12/13/20 BY KJ    Staphylococcus aureus (BCID) DETECTED (A) NOT DETECTED Final    Comment: Methicillin (oxacillin)-resistant Staphylococcus aureus (MRSA). MRSA is predictably resistant to beta-lactam antibiotics (except ceftaroline). Preferred therapy is vancomycin unless clinically contraindicated. Patient requires contact precautions if  hospitalized. CRITICAL RESULT CALLED TO, READ BACK BY AND VERIFIED WITH: PHARMD LYDIA CHEN AT 2045 ON 12/13/20 BY KJ    Staphylococcus epidermidis NOT DETECTED NOT DETECTED Final   Staphylococcus lugdunensis NOT DETECTED NOT DETECTED Final   Streptococcus species NOT DETECTED NOT DETECTED Final   Streptococcus agalactiae NOT DETECTED NOT DETECTED Final   Streptococcus pneumoniae NOT DETECTED NOT DETECTED Final   Streptococcus pyogenes NOT DETECTED NOT DETECTED Final   A.calcoaceticus-baumannii NOT DETECTED NOT DETECTED Final   Bacteroides fragilis NOT DETECTED NOT DETECTED Final   Enterobacterales NOT DETECTED NOT DETECTED Final   Enterobacter cloacae complex NOT DETECTED NOT DETECTED Final   Escherichia coli NOT DETECTED NOT DETECTED Final   Klebsiella aerogenes NOT DETECTED NOT DETECTED Final   Klebsiella oxytoca NOT DETECTED NOT DETECTED Final   Klebsiella pneumoniae NOT DETECTED NOT DETECTED Final   Proteus species NOT DETECTED NOT DETECTED Final   Salmonella species NOT DETECTED NOT DETECTED Final   Serratia marcescens NOT DETECTED NOT DETECTED Final   Haemophilus influenzae NOT DETECTED NOT DETECTED Final   Neisseria meningitidis NOT DETECTED NOT DETECTED Final   Pseudomonas aeruginosa NOT  DETECTED NOT DETECTED Final   Stenotrophomonas maltophilia NOT DETECTED NOT DETECTED Final   Candida albicans NOT DETECTED NOT DETECTED Final   Candida auris NOT DETECTED NOT DETECTED Final   Candida glabrata NOT DETECTED NOT DETECTED Final   Candida krusei NOT DETECTED NOT DETECTED Final   Candida parapsilosis NOT DETECTED NOT DETECTED Final   Candida tropicalis NOT DETECTED NOT DETECTED  Final   Cryptococcus neoformans/gattii NOT DETECTED NOT DETECTED Final   Meth resistant mecA/C and MREJ DETECTED (A) NOT DETECTED Final    Comment: CRITICAL RESULT CALLED TO, READ BACK BY AND VERIFIED WITH: PHARMD LYDIA CHEN AT 2045 ON 12/13/20 BY KJ Performed at [facility], [address] [place], [place] [address]   Urine culture     Status: Abnormal   Collection Time: 12/13/20 12:30 AM   Specimen: In/Out Cath Urine  Result Value Ref Range Status   Specimen Description IN/OUT CATH URINE  Final   Special Requests NONE  Final   Culture (A)  Final    <10,000 COLONIES/mL INSIGNIFICANT GROWTH Performed at [facility], [address] [place], [place] [address]    Report Status 12/14/2020 FINAL  Final  MRSA Next Gen by PCR, Nasal     Status: Abnormal   Collection Time: 12/14/20 11:31 AM   Specimen: Nasal Mucosa; Nasal Swab  Result Value Ref Range Status   MRSA by PCR Next Gen DETECTED (A) NOT DETECTED Final    Comment: RESULT CALLED TO, READ BACK BY AND VERIFIED WITH: Kennith Gain GAUNTLETT RN [id number] 12/14/20 A BROWNING (NOTE) The GeneXpert MRSA Assay (FDA approved for NASAL specimens only), is one component of a comprehensive MRSA colonization surveillance program. It is not intended to diagnose MRSA infection nor to guide or monitor treatment for MRSA infections. Test performance is not FDA approved in patients less than 57 years old. Performed at [facility], [address] [place], [place] [address]   Culture, blood (routine x 2)     Status: None (Preliminary result)   Collection  Time: 12/15/20  6:54 AM   Specimen: BLOOD  Result Value Ref Range Status   Specimen Description BLOOD LEFT ANTECUBITAL  Final   Special Requests   Final    BOTTLES DRAWN AEROBIC AND ANAEROBIC Blood Culture results may not be optimal due to an excessive volume of blood received in culture bottles   Culture   Final    NO GROWTH 3 DAYS Performed at [facility], [address] [place], [place] [address]    Report Status PENDING  Incomplete  Culture, blood (routine x 2)     Status: None (Preliminary result)   Collection Time: 12/15/20  6:54 AM   Specimen: BLOOD LEFT WRIST  Result Value Ref Range Status   Specimen Description BLOOD LEFT WRIST  Final   Special Requests   Final    BOTTLES DRAWN AEROBIC AND ANAEROBIC Blood Culture results may not be optimal due to an excessive volume of blood received in culture bottles   Culture   Final    NO GROWTH 3 DAYS Performed at [facility], [address] [place], [place] [address]    Report Status PENDING  Incomplete     Serology:   Imaging: If present, new imagings (plain films, ct scans, and mri) have been personally visualized and interpreted; radiology reports have been reviewed. Decision making incorporated into the Impression / Recommendations.  6/23 ct head Mild mucosal thickening in the paranasal sinuses.   No acute abnormality noted.  6/23 ct abd pelv 1. 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 Slight increase in endplate irregularity at the L3-L4 level. Left psoas myositis. There is a small ill-defined 1.4 x 2.2 cm collection within the left psoas muscle with small foci of internal air and faint peripheral  enhancement, suspicious for phlegmon/developing abscess. 2. No bowel obstruction or other acute abnormality in the abdomen/pelvis. 3. Mild distal colonic diverticulosis without diverticulitis. Mild colonic distension with air and  stool but no inflammatory change or obstruction. 4. Trace right pleural effusion. 5. 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   6/22 tte  1. Left ventricular ejection fraction, by estimation, is 50 to 55%. The  left ventricle has low normal function. The left ventricle has no regional  wall motion abnormalities. There is moderate concentric left ventricular  hypertrophy. Left ventricular  diastolic parameters are consistent with Grade I diastolic dysfunction  (impaired relaxation).   2. Right ventricular systolic function is normal. The right ventricular  size is normal.   3. Left atrial size was mildly dilated.   4. The mitral valve is normal in structure. No evidence of mitral valve  regurgitation. No evidence of mitral stenosis.   5. The aortic valve is tricuspid. Aortic valve regurgitation is not  visualized. No aortic stenosis is present.   6. The inferior vena cava is normal in size with greater than 50%  respiratory variability, suggesting right atrial pressure of 3 mmHg.  6/19 mri l-spine/abd 6/19 mri abd/l-spine 1. 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 No suspicious kidney lesions identified at this time. 2. Findings compatible with discitis/osteomyelitis involving the L3-4, L4-5 and L5-S1 levels. Surrounding edema and enhancement extends into the left psoas muscle. No discrete fluid collection identified to suggest abscess. See lumbar spine report from today.    Raymondo Band, MD [facility] [phone] pager    12/18/2020, 1:23 PM

## 2020-12-18 NOTE — Progress Notes (Signed)
Pain management an issue in mid-morning after PCA discontinued.  Multiple attempts made at adjunctive pain management after patient was OOB after IVF discontinued; suddenly complaining of cramping in lower back which extended to left posterior leg.  This was accompanied by episodes of dry heaves and nausea.  It was necessary for patient to return to bed after being in chair for approximately 45 minutes.  Heat was applied to lower back and a combination of anti-spasmotics, opioids, and anti-anxiety agents were used, with the latter ultimately resulting in the most effect.  It is prudent to accurately gauge effectiveness of medication in order to establish the most effective treatment for his pain.  A high level of anxiety appears to exacerbate his pain, and vice-versa.

## 2020-12-18 NOTE — Progress Notes (Signed)
PROGRESS NOTE  Louis Melody OEU:[id number] DOB: [birth year] DOA: 12/12/2020 PCP: Pcp, No  HPI/Recap of past 24 hours: 55 year old male with past medical history of CAD, hypertension, uncontrolled diabetes mellitus who presented to the [facility] emergency room with left-sided back pain x2 days.  CT scan unremarkable, but MRI noted osteomyelitis/discitis at L3-S1 and patient was transferred to [facility] to the hospitalist service on 6/19 given lack of infectious disease and neurosurgical coverage in [place].  Patient also noted to have MRSA bacteremia and a left psoas myositis but no discrete fluid collection.  Started on vancomycin. EDP discussed case with neurosurgery who recommended no surgical indications at this time.  Infectious disease evaluated patient recommending TTE to rule out endocarditis and depending on clearance of bacteremia, if TEE warranted.  Patient continues to complain of severe lower back pain.  No relief with p.o. opiates or lidocaine patch.  Also notes that he has not had a bowel movement since being transferred to [facility].  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 Sepsis was present on admission, since has stabilized.  Repeat blood cultures drawn on 6/22 and are currently in process.  TTE done without signs of vegetations. ID following. Currently on daptomycin. The fact taht his wbc count continues to trend up is concerning. He does not have any worsening neuro symptoms. If continues to trend up, may need to consider reimaging of his spine to evaluate for developing abscess.    CAD (coronary artery disease): Stable   Dyslipidemia   HTN (hypertension):  Blood pressure now better controlled.    Uncontrolled diabetes mellitus ([place]): A1c greater than 9.  Advised the patient is better controlled.    Overweight (BMI 25.0-29.9): Meets criteria BMI greater than 25    Constipated: CT abdomen negative for SBO. Patient is having bowel movements now    Back pain: patient reports it is well controlled on reduced dose dilaudid PCA. He is willing to try to transition off PCA. Will start on [prn norco and prn dilaudid  Acute encephalopathy: suspect this is related to uncontrolled pain and opiates. ABG, ammonia, UA, CT head unremarkable. Overall mental status has improved  Hyponatremia. Improved with saline infusion   Code Status: Full code  Family Communication: discussed with wife at the bedside  Disposition Plan: Home once back pain controlled, blood cultures cleared and can place PICC And decide on duration of antibiotics.  Hopefully will not need TEE   Consultants: Case discussed with neurosurgery Infectious disease  Procedures: 2D echo: no evidence of vegetations  Antimicrobials: IV vancomycin 6/19-6/22 Daptomycin 6/22>  DVT prophylaxis: Lovenox  Level of care: Telemetry Medical   Objective: Vitals:   12/18/20 0800 12/18/20 1017  BP:  (!) 183/87  Pulse:  (!) 106  Resp: 18 20  Temp:  98 F (36.7 C)  SpO2: 98%     Intake/Output Summary (Last 24 hours) at 12/18/2020 1056 Last data filed at 12/18/2020 0900 Gross per 24 hour  Intake 2814.86 ml  Output 2275 ml  Net 539.86 ml   Filed Weights   12/14/20 2315 12/15/20 2134 12/18/20 0519  Weight: 93.9 kg 94.1 kg 94.1 kg   Body mass index is 28.93 kg/m.  Exam: General exam: Alert, awake, oriented x 3  Respiratory system: Clear to auscultation. Respiratory effort normal. Cardiovascular system:RRR. No murmurs, rubs, gallops. Gastrointestinal system: Abdomen is nondistended, soft and nontender. No organomegaly or masses felt. Normal bowel sounds heard. Central nervous  system: Alert and oriented. No focal neurological deficits. Extremities: No C/C/E, +pedal pulses Skin: No rashes, lesions or ulcers Psychiatry: Judgement and insight appear normal. Mood & affect appropriate.     Data Reviewed: CBC: Recent Labs  Lab 12/14/20 0347 12/15/20 0654 12/16/20 0552 12/17/20 0304 12/18/20 0116  WBC 8.9 8.4 11.1* 13.6* 15.7*  HGB 14.3 14.1 15.1 15.1 14.0  HCT 42.6 41.9 43.6 43.7 40.6  MCV 89.7 88.4 86.7 87.8 87.1  PLT 175 179 200 251 293   Basic Metabolic Panel: Recent Labs  Lab 12/14/20 0347 12/15/20 0654 12/16/20 0552 12/17/20 0304 12/18/20 0116  NA 127* 128* 129* 135 133*  K 4.0 4.1 4.0 4.1 3.7  CL 93* 95* 95* 96* 100  CO2 22 24 24 25  19*  GLUCOSE 173* 205* 250* 203* 168*  BUN 17 20 31* 22* 16  CREATININE 1.06 0.77 0.96 0.84 0.85  CALCIUM 8.5* 8.6* 8.9 8.8* 8.3*   GFR: Estimated Creatinine Clearance: 112.3 mL/min (by C-G formula based on SCr of 0.85 mg/dL). Liver Function Tests: Recent Labs  Lab 12/13/20 0046 12/17/20 0304  AST 44* 78*  ALT 40 38  ALKPHOS 65 97  BILITOT 1.9* 0.9  PROT 5.9* 5.7*  ALBUMIN 2.9* 2.4*   No results for input(s): LIPASE, AMYLASE in the last 168 hours. Recent Labs  Lab 12/16/20 1207  AMMONIA 27   Coagulation Profile: Recent Labs  Lab 12/12/20 0025  INR 1.3*   Cardiac Enzymes: Recent Labs  Lab 12/16/20 0552  CKTOTAL 53   BNP (last 3 results) No results for input(s): PROBNP in the last 8760 hours. HbA1C: No results for input(s): HGBA1C in the last 72 hours.  CBG: Recent Labs  Lab 12/16/20 1630 12/17/20 0739 12/17/20 1147 12/17/20 1708 12/18/20 0651  GLUCAP 240* 224* 168* 159* 193*   Lipid Profile: No results for input(s): CHOL, HDL, LDLCALC, TRIG, CHOLHDL, LDLDIRECT in the last 72 hours. Thyroid Function Tests: No results for input(s): TSH, T4TOTAL, FREET4, T3FREE, THYROIDAB in the last 72 hours. Anemia Panel: No results for input(s): VITAMINB12, FOLATE, FERRITIN, TIBC, IRON,  RETICCTPCT in the last 72 hours. Urine analysis:    Component Value Date/Time   COLORURINE YELLOW 12/16/2020 1615   APPEARANCEUR CLEAR 12/16/2020 1615   LABSPEC 1.018 12/16/2020 1615   PHURINE 6.0 12/16/2020 1615   GLUCOSEU >=500 (A) 12/16/2020 1615   HGBUR LARGE (A) 12/16/2020 1615   BILIRUBINUR NEGATIVE 12/16/2020 1615   KETONESUR 20 (A) 12/16/2020 1615   PROTEINUR 30 (A) 12/16/2020 1615   NITRITE NEGATIVE 12/16/2020 1615   LEUKOCYTESUR NEGATIVE 12/16/2020 1615   Sepsis Labs: @LABRCNTIP (procalcitonin:4,lacticidven:4)  ) Recent Results (from the past 240 hour(s))  SARS CORONAVIRUS 2 (TAT 6-24 HRS) Nasopharyngeal Nasopharyngeal Swab     Status: None   Collection Time: 12/12/20 11:09 PM   Specimen: Nasopharyngeal Swab  Result Value Ref Range Status   SARS Coronavirus 2 NEGATIVE NEGATIVE Final    Comment: (NOTE) SARS-CoV-2 target nucleic acids are NOT DETECTED.  The SARS-CoV-2 RNA is generally detectable in upper and lower respiratory specimens during the acute phase of infection. Negative results do not preclude SARS-CoV-2 infection, do not rule out co-infections with other pathogens, and should not be used as the sole basis for treatment or other patient management decisions. Negative results must be combined with  clinical observations, patient history, and epidemiological information. The expected result is Negative.  Fact Sheet for Patients: SugarRoll.be  Fact Sheet for Healthcare Providers: https://www.woods-mathews.com/  This test is not yet approved or cleared by the [place] FDA and  has been authorized for detection and/or diagnosis of SARS-CoV-2 by FDA under an Emergency Use Authorization (EUA). This EUA will remain  in effect (meaning this test can be used) for the duration of the COVID-19 declaration under Se ction 564(b)(1) of the Act, 21 U.S.C. section 360bbb-3(b)(1), unless the authorization is terminated  or revoked sooner.  Performed at [facility], [place] [address], [place], [place] [address]   Blood culture (routine single)     Status: Abnormal   Collection Time: 12/13/20 12:25 AM   Specimen: BLOOD LEFT FOREARM  Result Value Ref Range Status   Specimen Description BLOOD LEFT FOREARM  Final   Special Requests   Final    BOTTLES DRAWN AEROBIC AND ANAEROBIC Blood Culture results may not be optimal due to an inadequate volume of blood received in culture bottles   Culture  Setup Time   Final    GRAM POSITIVE COCCI IN BOTH AEROBIC AND ANAEROBIC BOTTLES Organism ID to follow CRITICAL RESULT CALLED TO, READ BACK BY AND VERIFIED WITH: PHARMD LYDIA CHEN AT 2041 ON 12/13/20 BY KJ Performed at [facility], [place] [address], [place], [place] [address]    Culture METHICILLIN RESISTANT STAPHYLOCOCCUS AUREUS (A)  Final   Report Status 12/15/2020 FINAL  Final   Organism ID, Bacteria METHICILLIN RESISTANT STAPHYLOCOCCUS AUREUS  Final      Susceptibility   Methicillin resistant staphylococcus aureus - MIC*    CIPROFLOXACIN >=8 RESISTANT Resistant     ERYTHROMYCIN >=8 RESISTANT Resistant     GENTAMICIN <=0.5 SENSITIVE Sensitive     OXACILLIN >=4 RESISTANT Resistant     TETRACYCLINE <=1 SENSITIVE Sensitive     VANCOMYCIN 1 SENSITIVE Sensitive     TRIMETH/SULFA 80 RESISTANT Resistant     CLINDAMYCIN <=0.25 SENSITIVE Sensitive     RIFAMPIN <=0.5 SENSITIVE Sensitive     Inducible Clindamycin NEGATIVE Sensitive     * METHICILLIN RESISTANT STAPHYLOCOCCUS AUREUS  Blood Culture ID Panel (Reflexed)     Status: Abnormal   Collection Time: 12/13/20 12:25 AM  Result Value Ref Range Status   Enterococcus faecalis NOT DETECTED NOT DETECTED Final   Enterococcus Faecium NOT DETECTED NOT DETECTED Final   Listeria monocytogenes NOT DETECTED NOT DETECTED Final   Staphylococcus species DETECTED (A) NOT DETECTED Final    Comment: CRITICAL RESULT CALLED TO, READ BACK BY AND VERIFIED WITH: PHARMD LYDIA  CHEN AT 2045 ON 12/13/20 BY KJ    Staphylococcus aureus (BCID) DETECTED (A) NOT DETECTED Final    Comment: Methicillin (oxacillin)-resistant Staphylococcus aureus (MRSA). MRSA is predictably resistant to beta-lactam antibiotics (except ceftaroline). Preferred therapy is vancomycin unless clinically contraindicated. Patient requires contact precautions if  hospitalized. CRITICAL RESULT CALLED TO, READ BACK BY AND VERIFIED WITH: PHARMD LYDIA CHEN AT 2045 ON 12/13/20 BY KJ    Staphylococcus epidermidis NOT DETECTED NOT DETECTED Final   Staphylococcus lugdunensis NOT DETECTED NOT DETECTED Final   Streptococcus species NOT DETECTED NOT DETECTED Final   Streptococcus agalactiae NOT DETECTED NOT DETECTED Final   Streptococcus pneumoniae NOT DETECTED NOT DETECTED Final   Streptococcus pyogenes NOT DETECTED NOT DETECTED Final   A.calcoaceticus-baumannii NOT DETECTED NOT DETECTED Final   Bacteroides fragilis NOT DETECTED NOT DETECTED Final   Enterobacterales NOT DETECTED NOT DETECTED  Final   Enterobacter cloacae complex NOT DETECTED NOT DETECTED Final   Escherichia coli NOT DETECTED NOT DETECTED Final   Klebsiella aerogenes NOT DETECTED NOT DETECTED Final   Klebsiella oxytoca NOT DETECTED NOT DETECTED Final   Klebsiella pneumoniae NOT DETECTED NOT DETECTED Final   Proteus species NOT DETECTED NOT DETECTED Final   Salmonella species NOT DETECTED NOT DETECTED Final   Serratia marcescens NOT DETECTED NOT DETECTED Final   Haemophilus influenzae NOT DETECTED NOT DETECTED Final   Neisseria meningitidis NOT DETECTED NOT DETECTED Final   Pseudomonas aeruginosa NOT DETECTED NOT DETECTED Final   Stenotrophomonas maltophilia NOT DETECTED NOT DETECTED Final   Candida albicans NOT DETECTED NOT DETECTED Final   Candida auris NOT DETECTED NOT DETECTED Final   Candida glabrata NOT DETECTED NOT DETECTED Final   Candida krusei NOT DETECTED NOT DETECTED Final   Candida parapsilosis NOT DETECTED NOT DETECTED Final    Candida tropicalis NOT DETECTED NOT DETECTED Final   Cryptococcus neoformans/gattii NOT DETECTED NOT DETECTED Final   Meth resistant mecA/C and MREJ DETECTED (A) NOT DETECTED Final    Comment: CRITICAL RESULT CALLED TO, READ BACK BY AND VERIFIED WITH: PHARMD LYDIA CHEN AT 2045 ON 12/13/20 BY KJ Performed at [facility], [address] [place], [place] [address]   Urine culture     Status: Abnormal   Collection Time: 12/13/20 12:30 AM   Specimen: In/Out Cath Urine  Result Value Ref Range Status   Specimen Description IN/OUT CATH URINE  Final   Special Requests NONE  Final   Culture (A)  Final    <10,000 COLONIES/mL INSIGNIFICANT GROWTH Performed at [facility], [place] [address] [place], [place] [address]    Report Status 12/14/2020 FINAL  Final  MRSA Next Gen by PCR, Nasal     Status: Abnormal   Collection Time: 12/14/20 11:31 AM   Specimen: Nasal Mucosa; Nasal Swab  Result Value Ref Range Status   MRSA by PCR Next Gen DETECTED (A) NOT DETECTED Final    Comment: RESULT CALLED TO, READ BACK BY AND VERIFIED WITH: Tawni Levy RN [id number] 12/14/20 A BROWNING (NOTE) The GeneXpert MRSA Assay (FDA approved for NASAL specimens only), is one component of a comprehensive MRSA colonization surveillance program. It is not intended to diagnose MRSA infection nor to guide or monitor treatment for MRSA infections. Test performance is not FDA approved in patients less than 61 years old. Performed at [facility], [place] [address] [place], [place] [address]   Culture, blood (routine x 2)     Status: None (Preliminary result)   Collection Time: 12/15/20  6:54 AM   Specimen: BLOOD  Result Value Ref Range Status   Specimen Description BLOOD LEFT ANTECUBITAL  Final   Special Requests   Final    BOTTLES DRAWN AEROBIC AND ANAEROBIC Blood Culture results may not be optimal due to an excessive volume of blood received in culture bottles   Culture   Final    NO GROWTH 3 DAYS Performed  at [facility], [place] [address] [place],  [address]    Report Status PENDING  Incomplete  Culture, blood (routine x 2)     Status: None (Preliminary result)   Collection Time: 12/15/20  6:54 AM   Specimen: BLOOD LEFT WRIST  Result Value Ref Range Status   Specimen Description BLOOD LEFT WRIST  Final   Special Requests   Final    BOTTLES DRAWN AEROBIC AND ANAEROBIC Blood  Culture results may not be optimal due to an excessive volume of blood received in culture bottles   Culture   Final    NO GROWTH 3 DAYS Performed at [facility], [place] [address] [place], [place] [address]    Report Status PENDING  Incomplete      Studies: No results found.  Scheduled Meds:  amLODipine  2.5 mg Oral q morning   aspirin EC  81 mg Oral q morning   atorvastatin  40 mg Oral QHS   enoxaparin (LOVENOX) injection  40 mg Subcutaneous Daily   feeding supplement  237 mL Oral TID BM   HYDROmorphone       icosapent Ethyl  2 g Oral BID   insulin aspart  0-15 Units Subcutaneous TID WC   ketorolac  15 mg Intravenous Q6H   lactulose  10 g Oral Daily   losartan  25 mg Oral q morning   metoprolol succinate  25 mg Oral QHS   multivitamin with minerals  1 tablet Oral Daily   nicotine  21 mg Transdermal Daily   pantoprazole  40 mg Oral Daily   sodium chloride flush  3 mL Intravenous Q12H   sorbitol, milk of mag, mineral oil, glycerin (SMOG) enema  960 mL Rectal Once   venlafaxine XR  75 mg Oral Q breakfast    Continuous Infusions:  DAPTOmycin (CUBICIN)  IV 750 mg (12/17/20 2021)     LOS: 5 days     Kathie Dike, MD [facility]   12/18/2020, 10:56 AM

## 2020-12-19 LAB — BASIC METABOLIC PANEL
Anion gap: 12 (ref 5–15)
BUN: 14 mg/dL (ref 6–20)
CO2: 20 mmol/L — ABNORMAL LOW (ref 22–32)
Calcium: 8.2 mg/dL — ABNORMAL LOW (ref 8.9–10.3)
Chloride: 99 mmol/L (ref 98–111)
Creatinine, Ser: 0.7 mg/dL (ref 0.61–1.24)
GFR, Estimated: >60 mL/min (ref >60)
Glucose, Bld: 201 mg/dL — ABNORMAL HIGH (ref 70–99)
Potassium: 3.6 mmol/L (ref 3.5–5.1)
Sodium: 131 mmol/L — ABNORMAL LOW (ref 135–145)

## 2020-12-19 LAB — CBC
HCT: 42.9 % (ref 39.0–52.0)
Hemoglobin: 14.5 g/dL (ref 13.0–17.0)
MCH: 29.2 pg (ref 26.0–34.0)
MCHC: 33.8 g/dL (ref 30.0–36.0)
MCV: 86.5 fL (ref 80.0–100.0)
Platelets: 376 10*3/uL (ref 150–400)
RBC: 4.96 MIL/uL (ref 4.22–5.81)
RDW: 16 % — ABNORMAL HIGH (ref 11.5–15.5)
WBC: 17 10*3/uL — ABNORMAL HIGH (ref 4.0–10.5)
nRBC: 0 % (ref 0.0–0.2)

## 2020-12-19 LAB — GLUCOSE, CAPILLARY
Glucose-Capillary: 172 mg/dL — ABNORMAL HIGH (ref 70–99)
Glucose-Capillary: 212 mg/dL — ABNORMAL HIGH (ref 70–99)
Glucose-Capillary: 201 mg/dL — ABNORMAL HIGH (ref 70–99)
Glucose-Capillary: 163 mg/dL — ABNORMAL HIGH (ref 70–99)
Glucose-Capillary: 167 mg/dL — ABNORMAL HIGH (ref 70–99)

## 2020-12-19 LAB — CREATININE, SERUM
Creatinine, Ser: 0.7 mg/dL (ref 0.61–1.24)
GFR, Estimated: >60 mL/min (ref >60)

## 2020-12-19 MED ORDER — OXYCODONE HCL ER 10 MG PO T12A
20.0000 mg | EXTENDED_RELEASE_TABLET | Freq: Two times a day (BID) | ORAL | Status: DC
  Administered 2020-12-19 – 2020-12-22 (×6): 20 mg via ORAL
  Filled 2020-12-19 (×7): qty 2

## 2020-12-19 NOTE — Plan of Care (Signed)
  Problem: Pain Managment: Goal: General experience of comfort will improve Outcome: Progressing   

## 2020-12-19 NOTE — Progress Notes (Signed)
PROGRESS NOTE  Louis [name] EYC:[id number] DOB: Oct 17, 1965 DOA: 12/12/2020 PCP: Pcp, No  HPI/Recap of past 74 hours: 55 year old male with past medical history of CAD, hypertension, uncontrolled diabetes mellitus who presented to the [facility] emergency room with left-sided back pain x2 days.  CT scan unremarkable, but MRI noted osteomyelitis/discitis at L3-S1 and patient was transferred to [facility] to the hospitalist service on 6/19 given lack of infectious disease and neurosurgical coverage in [place].  Patient also noted to have MRSA bacteremia and a left psoas myositis but no discrete fluid collection.  Started on vancomycin. EDP discussed case with neurosurgery who recommended no surgical indications at this time.  Infectious disease evaluated patient recommending TTE to rule out endocarditis and depending on clearance of bacteremia, if TEE warranted.  6/22: Today patient is complaining of uncontrolled back pain. Pain is in lower back and is radiating to his hips bilaterally. He is unable to get comfortable.  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 Sepsis was present on admission, since has stabilized.  Repeat blood cultures drawn on 6/22 and are currently in process.  TTE done without signs of vegetations. ID following. Currently on daptomycin. The fact that his wbc count continues to trend up is concerning. He does not have any worsening neuro symptoms. Labs from today are currently pending. If continues to trend up, may need to consider reimaging of his spine to evaluate for developing abscess.    CAD (coronary artery disease): Stable   Dyslipidemia   HTN (hypertension):  Blood pressure now better controlled.    Uncontrolled diabetes mellitus ([place]): A1c greater than 9.  Advised the patient is better controlled.    Overweight (BMI 25.0-29.9): Meets criteria BMI greater than 25    Constipated: CT abdomen negative for SBO. Patient is having bowel movements now    Back pain: Dilaudid PCA was discontinued yesterday. Started on scheduled hydrocodone with prn dilaudid for breakthrough. Had increasing pain yesterday after PCA discontinued, but nursing notes indicate that he had a good night and was able to rest. I suspect that anxiety may also be exacerbating his pain. He seems to respond to ativan as well. Continue current pain management. May need to consider adding oxycontin for long acting meds.  Acute encephalopathy: suspect this is related to uncontrolled pain and opiates. ABG, ammonia, UA, CT head unremarkable. Overall mental status has improved  Hyponatremia. Improved with saline infusion   Code Status: Full code  Family Communication: discussed with wife at the bedside  Disposition Plan: Home once back pain controlled, blood cultures cleared and can place PICC And decide on duration of antibiotics.  Hopefully will not need TEE   Consultants: Case discussed with neurosurgery Infectious disease  Procedures: 2D echo: no evidence of vegetations  Antimicrobials: IV vancomycin 6/19-6/22 Daptomycin 6/22>  DVT prophylaxis: Lovenox  Level of care: Telemetry Medical   Objective: Vitals:   12/19/20 0457 12/19/20 0951  BP: (!) 154/83 (!) 171/100  Pulse: (!) 106 (!) 108  Resp: 18 18  Temp: 97.6 F (36.4 C) 97.6 F (36.4 C)  SpO2: 96% 94%    Intake/Output Summary (Last 24 hours) at 12/19/2020 1202 Last data filed at 12/19/2020 1497 Gross per 24 hour  Intake 1385 ml  Output  1850 ml  Net -465 ml   Filed Weights   12/15/20 2134 12/18/20 0519 12/19/20 0500  Weight: 94.1 kg 94.1 kg 94.3 kg   Body mass index is 29 kg/m.  Exam: General exam:  somnolent, but easily wakes up, tossing and turning in bed due to pain Respiratory system: Clear to auscultation. Respiratory effort normal. Cardiovascular system:RRR. No murmurs, rubs, gallops. Gastrointestinal system: Abdomen is nondistended, soft and nontender. No organomegaly or masses felt. Normal bowel sounds heard. Central nervous system: No focal neurological deficits. Extremities: No C/C/E, +pedal pulses Skin: No rashes, lesions or ulcers Psychiatry: Judgement and insight appear normal. Mood & affect appropriate.       Data Reviewed: CBC: Recent Labs  Lab 12/14/20 0347 12/15/20 0654 12/16/20 0552 12/17/20 0304 12/18/20 0116  WBC 8.9 8.4 11.1* 13.6* 15.7*  HGB 14.3 14.1 15.1 15.1 14.0  HCT 42.6 41.9 43.6 43.7 40.6  MCV 89.7 88.4 86.7 87.8 87.1  PLT 175 179 200 251 782   Basic Metabolic Panel: Recent Labs  Lab 12/14/20 0347 12/15/20 0654 12/16/20 0552 12/17/20 0304 12/18/20 0116 12/19/20 0547  NA 127* 128* 129* 135 133*  --   K 4.0 4.1 4.0 4.1 3.7  --   CL 93* 95* 95* 96* 100  --   CO2 _0 19*  --   GLUCOSE 173* 205* 250* 203* 168*  --   BUN 17 20 31* 22* 16  --   CREATININE 1.06 0.77 0.96 0.84 0.85 0.70  CALCIUM 8.5* 8.6* 8.9 8.8* 8.3*  --    GFR: Estimated Creatinine Clearance: 119.5 mL/min (by C-G formula based on SCr of 0.7 mg/dL). Liver Function Tests: Recent Labs  Lab 12/13/20 0046 12/17/20 0304  AST 44* 78*  ALT 40 38  ALKPHOS 65 97  BILITOT 1.9* 0.9  PROT 5.9* 5.7*  ALBUMIN 2.9* 2.4*   No results for input(s): LIPASE, AMYLASE in the last 168 hours. Recent Labs  Lab 12/16/20 1207  AMMONIA 27   Coagulation Profile: No results for input(s): INR, PROTIME in the last 168 hours.  Cardiac Enzymes: Recent Labs  Lab 12/16/20 0552  CKTOTAL 53   BNP (last 3 results) No results for input(s): PROBNP in the last 8760 hours. HbA1C: No results for input(s): HGBA1C in the last 72 hours.  CBG: Recent Labs  Lab 12/17/20 0739  12/17/20 1147 12/17/20 1708 12/18/20 0651 12/18/20 2204  GLUCAP 224* 168* 159* 193* 216*   Lipid Profile: No results for input(s): CHOL, HDL, LDLCALC, TRIG, CHOLHDL, LDLDIRECT in the last 72 hours. Thyroid Function Tests: No results for input(s): TSH, T4TOTAL, FREET4, T3FREE, THYROIDAB in the last 72 hours. Anemia Panel: No results for input(s): VITAMINB12, FOLATE, FERRITIN, TIBC, IRON, RETICCTPCT in the last 72 hours. Urine analysis:    Component Value Date/Time   COLORURINE YELLOW 12/16/2020 1615   APPEARANCEUR CLEAR 12/16/2020 1615   LABSPEC 1.018 12/16/2020 1615   PHURINE 6.0 12/16/2020 1615   GLUCOSEU >=500 (A) 12/16/2020 1615   HGBUR LARGE (A) 12/16/2020 1615   BILIRUBINUR NEGATIVE 12/16/2020 1615   KETONESUR 20 (A) 12/16/2020 1615   PROTEINUR 30 (A) 12/16/2020 1615   NITRITE NEGATIVE 12/16/2020 1615   LEUKOCYTESUR NEGATIVE 12/16/2020 1615   Sepsis Labs: _1 (procalcitonin:4,lacticidven:4)  ) Recent Results (from the past 240 hour(s))  SARS CORONAVIRUS 2 (TAT 6-24 HRS) Nasopharyngeal Nasopharyngeal Swab     Status: None   Collection Time: 12/12/20 11:09 PM   Specimen: Nasopharyngeal Swab  Result Value Ref Range Status  SARS Coronavirus 2 NEGATIVE NEGATIVE Final    Comment: (NOTE) SARS-CoV-2 target nucleic acids are NOT DETECTED.  The SARS-CoV-2 RNA is generally detectable in upper and lower respiratory specimens during the acute phase of infection. Negative results do not preclude SARS-CoV-2 infection, do not rule out co-infections with other pathogens, and should not be used as the sole basis for treatment or other patient management decisions. Negative results must be combined with clinical observations, patient history, and epidemiological information. The expected result is Negative.  Fact Sheet for Patients: SugarRoll.be  Fact Sheet for Healthcare Providers: https://www.woods-mathews.com/  This test is  not yet approved or cleared by the [place] FDA and  has been authorized for detection and/or diagnosis of SARS-CoV-2 by FDA under an Emergency Use Authorization (EUA). This EUA will remain  in effect (meaning this test can be used) for the duration of the COVID-19 declaration under Se ction 564(b)(1) of the Act, 21 U.S.C. section 360bbb-3(b)(1), unless the authorization is terminated or revoked sooner.  Performed at [facility], [place] [address] [place], [place] [address]   Blood culture (routine single)     Status: Abnormal   Collection Time: 12/13/20 12:25 AM   Specimen: BLOOD LEFT FOREARM  Result Value Ref Range Status   Specimen Description BLOOD LEFT FOREARM  Final   Special Requests   Final    BOTTLES DRAWN AEROBIC AND ANAEROBIC Blood Culture results may not be optimal due to an inadequate volume of blood received in culture bottles   Culture  Setup Time   Final    GRAM POSITIVE COCCI IN BOTH AEROBIC AND ANAEROBIC BOTTLES Organism ID to follow CRITICAL RESULT CALLED TO, READ BACK BY AND VERIFIED WITH: PHARMD LYDIA CHEN AT 2041 ON 12/13/20 BY KJ Performed at [facility], [place] [address] [place], [place] [address]    Culture METHICILLIN RESISTANT STAPHYLOCOCCUS AUREUS (A)  Final   Report Status 12/15/2020 FINAL  Final   Organism ID, Bacteria METHICILLIN RESISTANT STAPHYLOCOCCUS AUREUS  Final      Susceptibility   Methicillin resistant staphylococcus aureus - MIC*    CIPROFLOXACIN >=8 RESISTANT Resistant     ERYTHROMYCIN >=8 RESISTANT Resistant     GENTAMICIN <=0.5 SENSITIVE Sensitive     OXACILLIN >=4 RESISTANT Resistant     TETRACYCLINE <=1 SENSITIVE Sensitive     VANCOMYCIN 1 SENSITIVE Sensitive     TRIMETH/SULFA 80 RESISTANT Resistant     CLINDAMYCIN <=0.25 SENSITIVE Sensitive     RIFAMPIN <=0.5 SENSITIVE Sensitive     Inducible Clindamycin NEGATIVE Sensitive     * METHICILLIN RESISTANT STAPHYLOCOCCUS AUREUS  Blood Culture ID Panel (Reflexed)      Status: Abnormal   Collection Time: 12/13/20 12:25 AM  Result Value Ref Range Status   Enterococcus faecalis NOT DETECTED NOT DETECTED Final   Enterococcus Faecium NOT DETECTED NOT DETECTED Final   Listeria monocytogenes NOT DETECTED NOT DETECTED Final   Staphylococcus species DETECTED (A) NOT DETECTED Final    Comment: CRITICAL RESULT CALLED TO, READ BACK BY AND VERIFIED WITH: PHARMD LYDIA CHEN AT 2045 ON 12/13/20 BY KJ    Staphylococcus aureus (BCID) DETECTED (A) NOT DETECTED Final    Comment: Methicillin (oxacillin)-resistant Staphylococcus aureus (MRSA). MRSA is predictably resistant to beta-lactam antibiotics (except ceftaroline). Preferred therapy is vancomycin unless clinically contraindicated. Patient requires contact precautions if  hospitalized. CRITICAL RESULT CALLED TO, READ BACK BY AND VERIFIED WITH: PHARMD LYDIA CHEN AT 2045 ON 12/13/20 BY KJ    Staphylococcus epidermidis  NOT DETECTED NOT DETECTED Final   Staphylococcus lugdunensis NOT DETECTED NOT DETECTED Final   Streptococcus species NOT DETECTED NOT DETECTED Final   Streptococcus agalactiae NOT DETECTED NOT DETECTED Final   Streptococcus pneumoniae NOT DETECTED NOT DETECTED Final   Streptococcus pyogenes NOT DETECTED NOT DETECTED Final   A.calcoaceticus-baumannii NOT DETECTED NOT DETECTED Final   Bacteroides fragilis NOT DETECTED NOT DETECTED Final   Enterobacterales NOT DETECTED NOT DETECTED Final   Enterobacter cloacae complex NOT DETECTED NOT DETECTED Final   Escherichia coli NOT DETECTED NOT DETECTED Final   Klebsiella aerogenes NOT DETECTED NOT DETECTED Final   Klebsiella oxytoca NOT DETECTED NOT DETECTED Final   Klebsiella pneumoniae NOT DETECTED NOT DETECTED Final   Proteus species NOT DETECTED NOT DETECTED Final   Salmonella species NOT DETECTED NOT DETECTED Final   Serratia marcescens NOT DETECTED NOT DETECTED Final   Haemophilus influenzae NOT DETECTED NOT DETECTED Final   Neisseria meningitidis NOT  DETECTED NOT DETECTED Final   Pseudomonas aeruginosa NOT DETECTED NOT DETECTED Final   Stenotrophomonas maltophilia NOT DETECTED NOT DETECTED Final   Candida albicans NOT DETECTED NOT DETECTED Final   Candida auris NOT DETECTED NOT DETECTED Final   Candida glabrata NOT DETECTED NOT DETECTED Final   Candida krusei NOT DETECTED NOT DETECTED Final   Candida parapsilosis NOT DETECTED NOT DETECTED Final   Candida tropicalis NOT DETECTED NOT DETECTED Final   Cryptococcus neoformans/gattii NOT DETECTED NOT DETECTED Final   Meth resistant mecA/C and MREJ DETECTED (A) NOT DETECTED Final    Comment: CRITICAL RESULT CALLED TO, READ BACK BY AND VERIFIED WITH: PHARMD LYDIA CHEN AT 2045 ON 12/13/20 BY KJ Performed at [facility], [address], [place], [place] [address]   Urine culture     Status: Abnormal   Collection Time: 12/13/20 12:30 AM   Specimen: In/Out Cath Urine  Result Value Ref Range Status   Specimen Description IN/OUT CATH URINE  Final   Special Requests NONE  Final   Culture (A)  Final    <10,000 COLONIES/mL INSIGNIFICANT GROWTH Performed at [facility], [place] [address] [place], [place] [address]    Report Status 12/14/2020 FINAL  Final  MRSA Next Gen by PCR, Nasal     Status: Abnormal   Collection Time: 12/14/20 11:31 AM   Specimen: Nasal Mucosa; Nasal Swab  Result Value Ref Range Status   MRSA by PCR Next Gen DETECTED (A) NOT DETECTED Final    Comment: RESULT CALLED TO, READ BACK BY AND VERIFIED WITH: Tawni Levy RN [id number] 12/14/20 A BROWNING (NOTE) The GeneXpert MRSA Assay (FDA approved for NASAL specimens only), is one component of a comprehensive MRSA colonization surveillance program. It is not intended to diagnose MRSA infection nor to guide or monitor treatment for MRSA infections. Test performance is not FDA approved in patients less than 34 years old. Performed at [facility], [place] [address], [place], [place] [address]   Culture, blood (routine  x 2)     Status: None (Preliminary result)   Collection Time: 12/15/20  6:54 AM   Specimen: BLOOD  Result Value Ref Range Status   Specimen Description BLOOD LEFT ANTECUBITAL  Final   Special Requests   Final    BOTTLES DRAWN AEROBIC AND ANAEROBIC Blood Culture results may not be optimal due to an excessive volume of blood received in culture bottles   Culture   Final    NO GROWTH 4 DAYS Performed at [facility],  [address] [place], [place] [address]    Report Status PENDING  Incomplete  Culture, blood (routine x 2)     Status: None (Preliminary result)   Collection Time: 12/15/20  6:54 AM   Specimen: BLOOD LEFT WRIST  Result Value Ref Range Status   Specimen Description BLOOD LEFT WRIST  Final   Special Requests   Final    BOTTLES DRAWN AEROBIC AND ANAEROBIC Blood Culture results may not be optimal due to an excessive volume of blood received in culture bottles   Culture   Final    NO GROWTH 4 DAYS Performed at [facility], [place] [address], [place], [place] [address]    Report Status PENDING  Incomplete      Studies: No results found.  Scheduled Meds:  amLODipine  2.5 mg Oral q morning   aspirin EC  81 mg Oral q morning   atorvastatin  40 mg Oral QHS   enoxaparin (LOVENOX) injection  40 mg Subcutaneous Daily   feeding supplement  237 mL Oral TID BM   gabapentin  200 mg Oral TID   HYDROcodone-acetaminophen  2 tablet Oral Q6H   icosapent Ethyl  2 g Oral BID   insulin aspart  0-15 Units Subcutaneous TID WC   lactulose  10 g Oral Daily   losartan  25 mg Oral q morning   metoprolol succinate  25 mg Oral QHS   multivitamin with minerals  1 tablet Oral Daily   nicotine  21 mg Transdermal Daily   pantoprazole  40 mg Oral Daily   sodium chloride flush  3 mL Intravenous Q12H   sorbitol, milk of mag, mineral oil, glycerin (SMOG) enema  960 mL Rectal Once   venlafaxine XR  75 mg Oral Q breakfast    Continuous Infusions:  DAPTOmycin (CUBICIN)  IV Stopped  (12/18/20 2149)     LOS: 6 days     Kathie Dike, MD [facility]   12/19/2020, 12:02 PM

## 2020-12-19 NOTE — Progress Notes (Signed)
Dialudid PCA discontinued earlier in the day.  Wasted 46ml in steri cycle.  Witnessed by Bernie Covey RN Hilton Sinclair BSN RN CMSRN

## 2020-12-19 NOTE — Plan of Care (Signed)
Pain better managed tonight and pt has been able to sleep for several hours at a time.  Has not required any IV pain medication through the night.  No nausea voiced or noted tonight.  Pt was able to stand by himself and get to the [facility] and get washed up by himself with little prompting from staff.    Hilton Sinclair BSN RN CMSRN    Problem: Pain Managment: Goal: General experience of comfort will improve Outcome: Not Progressing

## 2020-12-20 ENCOUNTER — Inpatient Hospital Stay (HOSPITAL_COMMUNITY): Payer: Federal, State, Local not specified - PPO

## 2020-12-20 DIAGNOSIS — K6812 Psoas muscle abscess: Secondary | ICD-10-CM | POA: Diagnosis not present

## 2020-12-20 DIAGNOSIS — G062 Extradural and subdural abscess, unspecified: Secondary | ICD-10-CM | POA: Diagnosis not present

## 2020-12-20 LAB — CBC
HCT: 42.5 % (ref 39.0–52.0)
Hemoglobin: 14.9 g/dL (ref 13.0–17.0)
MCH: 29.7 pg (ref 26.0–34.0)
MCHC: 35.1 g/dL (ref 30.0–36.0)
MCV: 84.7 fL (ref 80.0–100.0)
Platelets: 408 10*3/uL — ABNORMAL HIGH (ref 150–400)
RBC: 5.02 MIL/uL (ref 4.22–5.81)
RDW: 15.9 % — ABNORMAL HIGH (ref 11.5–15.5)
WBC: 23.2 10*3/uL — ABNORMAL HIGH (ref 4.0–10.5)
nRBC: 0 % (ref 0.0–0.2)

## 2020-12-20 LAB — BASIC METABOLIC PANEL
Anion gap: 9 (ref 5–15)
BUN: 10 mg/dL (ref 6–20)
CO2: 26 mmol/L (ref 22–32)
Calcium: 8.2 mg/dL — ABNORMAL LOW (ref 8.9–10.3)
Chloride: 99 mmol/L (ref 98–111)
Creatinine, Ser: 0.64 mg/dL (ref 0.61–1.24)
GFR, Estimated: >60 mL/min (ref >60)
Glucose, Bld: 175 mg/dL — ABNORMAL HIGH (ref 70–99)
Potassium: 3.3 mmol/L — ABNORMAL LOW (ref 3.5–5.1)
Sodium: 134 mmol/L — ABNORMAL LOW (ref 135–145)

## 2020-12-20 LAB — CULTURE, BLOOD (ROUTINE X 2)
Culture: NO GROWTH
Culture: NO GROWTH

## 2020-12-20 LAB — GLUCOSE, CAPILLARY
Glucose-Capillary: 188 mg/dL — ABNORMAL HIGH (ref 70–99)
Glucose-Capillary: 150 mg/dL — ABNORMAL HIGH (ref 70–99)
Glucose-Capillary: 152 mg/dL — ABNORMAL HIGH (ref 70–99)
Glucose-Capillary: 163 mg/dL — ABNORMAL HIGH (ref 70–99)

## 2020-12-20 MED ORDER — AMLODIPINE BESYLATE 5 MG PO TABS
2.5000 mg | ORAL_TABLET | Freq: Every morning | ORAL | Status: DC
  Administered 2020-12-20 – 2020-12-26 (×6): 2.5 mg via ORAL
  Filled 2020-12-20 (×5): qty 1

## 2020-12-20 MED ORDER — POTASSIUM CHLORIDE CRYS ER 20 MEQ PO TBCR
40.0000 meq | EXTENDED_RELEASE_TABLET | Freq: Once | ORAL | Status: AC
  Administered 2020-12-20: 40 meq via ORAL
  Filled 2020-12-20: qty 2

## 2020-12-20 MED ORDER — MUPIROCIN 2 % EX OINT
1.0000 "application " | TOPICAL_OINTMENT | Freq: Two times a day (BID) | CUTANEOUS | Status: AC
  Administered 2020-12-20 – 2020-12-24 (×9): 1 via NASAL
  Filled 2020-12-20 (×3): qty 22

## 2020-12-20 MED ORDER — LOSARTAN POTASSIUM 25 MG PO TABS
25.0000 mg | ORAL_TABLET | Freq: Every morning | ORAL | Status: DC
  Administered 2020-12-20 – 2020-12-26 (×6): 25 mg via ORAL
  Filled 2020-12-20 (×4): qty 1

## 2020-12-20 MED ORDER — ASPIRIN EC 81 MG PO TBEC
81.0000 mg | DELAYED_RELEASE_TABLET | Freq: Every morning | ORAL | Status: DC
  Administered 2020-12-20: 81 mg via ORAL

## 2020-12-20 MED ORDER — GADOBUTROL 1 MMOL/ML IV SOLN
9.0000 mL | Freq: Once | INTRAVENOUS | Status: AC | PRN
  Administered 2020-12-20: 9 mL via INTRAVENOUS

## 2020-12-20 MED ORDER — IOHEXOL 9 MG/ML PO SOLN
ORAL | Status: AC
  Filled 2020-12-20: qty 1000

## 2020-12-20 MED ORDER — SORBITOL 70 % SOLN
960.0000 mL | TOPICAL_OIL | Freq: Once | ORAL | Status: AC
  Administered 2020-12-20: 960 mL via RECTAL
  Filled 2020-12-20: qty 473

## 2020-12-20 MED ORDER — CHLORHEXIDINE GLUCONATE CLOTH 2 % EX PADS
6.0000 | MEDICATED_PAD | Freq: Every day | CUTANEOUS | Status: AC
  Administered 2020-12-20 – 2020-12-24 (×4): 6 via TOPICAL

## 2020-12-20 NOTE — Progress Notes (Signed)
PROGRESS NOTE  Louis [name] ATF:[id number] DOB: 08-30-1965 DOA: 12/12/2020 PCP: Pcp, No  HPI/Recap of past 40 hours: 55 year old male with past medical history of CAD, hypertension, uncontrolled diabetes mellitus who presented to the [facility] emergency room with left-sided back pain x2 days.  CT scan unremarkable, but MRI noted osteomyelitis/discitis at L3-S1 and patient was transferred to [facility] to the hospitalist service on 6/19 given lack of infectious disease and neurosurgical coverage in [place].  Patient also noted to have MRSA bacteremia and a left psoas myositis but no discrete fluid collection.  Started on vancomycin. EDP initially discussed case with neurosurgery who recommended no surgical indications at this time.  Infectious disease evaluated patient recommending TTE to rule out endocarditis which was found to be negative.  Patient was started on daptomycin with plans for an 8-week course.  Due to persistent pain and worsening leukocytosis, MRI was repeated on 6/27 that showed evidence of epidural abscess and psoas abscess.  Neurosurgery formally consulted as patient may need operative management.    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 Sepsis was present on admission, since has stabilized.  Repeat blood cultures drawn on 6/22 and have not shown any growth.  TTE done without signs of vegetations. ID following. Currently on daptomycin.  Patient's back pain has persisted and WBC count has trended up.  MRI of lumbar spine repeated today that showed evidence of extensive epidural abscess that has developed.  This extends beyond lumbar region and will need to be  evaluated with thoracic MRI.  We will plan on obtaining MRI of T and C-spine later tonight.  Neurosurgery consulted, appreciate their input.  May need operative management.  MRI also commented on psoas abscess.  Case reviewed with interventional radiology, Dr. Serafina Royals and it was felt that patient had multiple small abscesses in the psoas, none large enough to place a drain.  Neurosurgery has requested that IR aspirate this collection prior to undergoing any spine surgery.  Order for interventional radiology evaluation has been entered.  We will keep n.p.o. after midnight.  Hold tomorrow's dose of Lovenox.    CAD (coronary artery disease): Stable   Dyslipidemia   HTN (hypertension): Blood pressure elevated, likely related to pain    Uncontrolled diabetes mellitus ([place]): A1c greater than 9.  Advised the patient is better controlled.    Overweight (BMI 25.0-29.9): Meets criteria BMI greater than 25    Constipated: CT abdomen negative for SBO.  Abdomen appears to be more distended today.  Wife reports that he has not had a bowel movement in a couple of days.  He has been getting lactulose.  Since he is having nausea at this time, we will provide an enema    Back pain: Currently on scheduled hydrocodone with prn dilaudid for breakthrough.  Has also been started on OxyContin for long-acting pain med.  I suspect that anxiety may also be exacerbating his pain. He seems to respond to ativan as well. Continue current pain management.   Acute encephalopathy: suspect this is related to uncontrolled pain and opiates. ABG, ammonia, UA, CT head unremarkable. Overall mental status has improved  Hyponatremia. Improved with saline infusion   Code Status: Full code  Family Communication: discussed with wife at the  bedside  Disposition Plan: Pending further management of epidural abscess   Consultants: Neurosurgery Infectious disease  Procedures: 2D echo: no evidence of vegetations  Antimicrobials: IV  vancomycin 6/19-6/22 Daptomycin 6/22>  DVT prophylaxis: Lovenox  Level of care: Telemetry Medical   Subjective Patient continues to have significant back pain.  Denies any weakness or numbness in his legs.  He is feeling nauseous and having dry heaving at times.  Last bowel movement was couple of days ago per his wife.  Objective: Vitals:   12/20/20 0332 12/20/20 1806  BP: (!) 152/103 124/90  Pulse: (!) 110 (!) 112  Resp: 16 16  Temp: 98.5 F (36.9 C) 98 F (36.7 C)  SpO2: 93% 96%    Intake/Output Summary (Last 24 hours) at 12/20/2020 1854 Last data filed at 12/20/2020 1311 Gross per 24 hour  Intake 768 ml  Output 1250 ml  Net -482 ml   Filed Weights   12/18/20 0519 12/19/20 0500 12/20/20 0500  Weight: 94.1 kg 94.3 kg 92.9 kg   Body mass index is 28.56 kg/m.  Exam: General exam: Alert, awake, oriented x 3 Respiratory system: Clear to auscultation. Respiratory effort normal. Cardiovascular system:RRR. No murmurs, rubs, gallops. Gastrointestinal system: Abdomen is distended, soft and nontender. No organomegaly or masses felt. Normal bowel sounds heard. Central nervous system: Alert and oriented. No focal neurological deficits. Extremities: No C/C/E, +pedal pulses Skin: No rashes, lesions or ulcers Psychiatry: Judgement and insight appear normal. Mood & affect appropriate.        Data Reviewed: CBC: Recent Labs  Lab 12/16/20 0552 12/17/20 0304 12/18/20 0116 12/19/20 0547 12/20/20 0028  WBC 11.1* 13.6* 15.7* 17.0* 23.2*  HGB 15.1 15.1 14.0 14.5 14.9  HCT 43.6 43.7 40.6 42.9 42.5  MCV 86.7 87.8 87.1 86.5 84.7  PLT 200 251 293 376 741*   Basic Metabolic Panel: Recent Labs  Lab 12/16/20 0552 12/17/20 0304 12/18/20 0116 12/19/20 0547 12/20/20 0028  NA 129* 135 133* 131* 134*  K 4.0 4.1 3.7 3.6 3.3*  CL 95* 96* 100 99 99  CO2 24 25 19* 20* 26  GLUCOSE 250* 203* 168* 201* 175*  BUN 31* 22* _0 CREATININE 0.96 0.84 0.85 0.70  0.70 0.64   CALCIUM 8.9 8.8* 8.3* 8.2* 8.2*   GFR: Estimated Creatinine Clearance: 118.6 mL/min (by C-G formula based on SCr of 0.64 mg/dL). Liver Function Tests: Recent Labs  Lab 12/17/20 0304  AST 78*  ALT 38  ALKPHOS 97  BILITOT 0.9  PROT 5.7*  ALBUMIN 2.4*   No results for input(s): LIPASE, AMYLASE in the last 168 hours. Recent Labs  Lab 12/16/20 1207  AMMONIA 27   Coagulation Profile: No results for input(s): INR, PROTIME in the last 168 hours.  Cardiac Enzymes: Recent Labs  Lab 12/16/20 0552  CKTOTAL 53   BNP (last 3 results) No results for input(s): PROBNP in the last 8760 hours. HbA1C: No results for input(s): HGBA1C in the last 72 hours.  CBG: Recent Labs  Lab 12/19/20 1632 12/19/20 2052 12/20/20 0641 12/20/20 1136 12/20/20 1613  GLUCAP 163* 167* 188* 150* 152*   Lipid Profile: No results for input(s): CHOL, HDL, LDLCALC, TRIG, CHOLHDL, LDLDIRECT in the last 72 hours. Thyroid Function Tests: No results for input(s): TSH, T4TOTAL, FREET4, T3FREE, THYROIDAB in the last 72 hours. Anemia Panel: No results for input(s): VITAMINB12, FOLATE, FERRITIN, TIBC, IRON, RETICCTPCT in the last 72 hours. Urine analysis:    Component Value Date/Time   COLORURINE YELLOW 12/16/2020  [place] 12/16/2020 1615   LABSPEC 1.018 12/16/2020 1615   PHURINE 6.0 12/16/2020 1615   GLUCOSEU >=500 (A) 12/16/2020 1615   HGBUR LARGE (A) 12/16/2020 1615   BILIRUBINUR NEGATIVE 12/16/2020 1615   KETONESUR 20 (A) 12/16/2020 1615   PROTEINUR 30 (A) 12/16/2020 1615   NITRITE NEGATIVE 12/16/2020 1615   LEUKOCYTESUR NEGATIVE 12/16/2020 1615   Sepsis Labs: _0 (procalcitonin:4,lacticidven:4)  ) Recent Results (from the past 240 hour(s))  SARS CORONAVIRUS 2 (TAT 6-24 HRS) Nasopharyngeal Nasopharyngeal Swab     Status: None   Collection Time: 12/12/20 11:09 PM   Specimen: Nasopharyngeal Swab  Result Value Ref Range Status   SARS Coronavirus 2 NEGATIVE NEGATIVE Final     Comment: (NOTE) SARS-CoV-2 target nucleic acids are NOT DETECTED.  The SARS-CoV-2 RNA is generally detectable in upper and lower respiratory specimens during the acute phase of infection. Negative results do not preclude SARS-CoV-2 infection, do not rule out co-infections with other pathogens, and should not be used as the sole basis for treatment or other patient management decisions. Negative results must be combined with clinical observations, patient history, and epidemiological information. The expected result is Negative.  Fact Sheet for Patients: SugarRoll.be  Fact Sheet for Healthcare Providers: https://www.woods-mathews.com/  This test is not yet approved or cleared by the [place] FDA and  has been authorized for detection and/or diagnosis of SARS-CoV-2 by FDA under an Emergency Use Authorization (EUA). This EUA will remain  in effect (meaning this test can be used) for the duration of the COVID-19 declaration under Se ction 564(b)(1) of the Act, 21 U.S.C. section 360bbb-3(b)(1), unless the authorization is terminated or revoked sooner.  Performed at [facility], [place] [address], [place], [place] [address]   Blood culture (routine single)     Status: Abnormal   Collection Time: 12/13/20 12:25 AM   Specimen: BLOOD LEFT FOREARM  Result Value Ref Range Status   Specimen Description BLOOD LEFT FOREARM  Final   Special Requests   Final    BOTTLES DRAWN AEROBIC AND ANAEROBIC Blood Culture results may not be optimal due to an inadequate volume of blood received in culture bottles   Culture  Setup Time   Final    GRAM POSITIVE COCCI IN BOTH AEROBIC AND ANAEROBIC BOTTLES Organism ID to follow CRITICAL RESULT CALLED TO, READ BACK BY AND VERIFIED WITH: PHARMD LYDIA CHEN AT 2041 ON 12/13/20 BY KJ Performed at [facility], [place] [address] [place], [place] [address]    Culture METHICILLIN RESISTANT STAPHYLOCOCCUS  AUREUS (A)  Final   Report Status 12/15/2020 FINAL  Final   Organism ID, Bacteria METHICILLIN RESISTANT STAPHYLOCOCCUS AUREUS  Final      Susceptibility   Methicillin resistant staphylococcus aureus - MIC*    CIPROFLOXACIN >=8 RESISTANT Resistant     ERYTHROMYCIN >=8 RESISTANT Resistant     GENTAMICIN <=0.5 SENSITIVE Sensitive     OXACILLIN >=4 RESISTANT Resistant     TETRACYCLINE <=1 SENSITIVE Sensitive     VANCOMYCIN 1 SENSITIVE Sensitive     TRIMETH/SULFA 80 RESISTANT Resistant     CLINDAMYCIN <=0.25 SENSITIVE Sensitive     RIFAMPIN <=0.5 SENSITIVE Sensitive     Inducible Clindamycin NEGATIVE Sensitive     * METHICILLIN RESISTANT STAPHYLOCOCCUS AUREUS  Blood Culture ID Panel (Reflexed)     Status: Abnormal   Collection Time: 12/13/20 12:25 AM  Result Value Ref Range Status   Enterococcus faecalis NOT DETECTED NOT DETECTED Final  Enterococcus Faecium NOT DETECTED NOT DETECTED Final   Listeria monocytogenes NOT DETECTED NOT DETECTED Final   Staphylococcus species DETECTED (A) NOT DETECTED Final    Comment: CRITICAL RESULT CALLED TO, READ BACK BY AND VERIFIED WITH: PHARMD LYDIA CHEN AT 2045 ON 12/13/20 BY KJ    Staphylococcus aureus (BCID) DETECTED (A) NOT DETECTED Final    Comment: Methicillin (oxacillin)-resistant Staphylococcus aureus (MRSA). MRSA is predictably resistant to beta-lactam antibiotics (except ceftaroline). Preferred therapy is vancomycin unless clinically contraindicated. Patient requires contact precautions if  hospitalized. CRITICAL RESULT CALLED TO, READ BACK BY AND VERIFIED WITH: PHARMD LYDIA CHEN AT 2045 ON 12/13/20 BY KJ    Staphylococcus epidermidis NOT DETECTED NOT DETECTED Final   Staphylococcus lugdunensis NOT DETECTED NOT DETECTED Final   Streptococcus species NOT DETECTED NOT DETECTED Final   Streptococcus agalactiae NOT DETECTED NOT DETECTED Final   Streptococcus pneumoniae NOT DETECTED NOT DETECTED Final   Streptococcus pyogenes NOT DETECTED NOT  DETECTED Final   A.calcoaceticus-baumannii NOT DETECTED NOT DETECTED Final   Bacteroides fragilis NOT DETECTED NOT DETECTED Final   Enterobacterales NOT DETECTED NOT DETECTED Final   Enterobacter cloacae complex NOT DETECTED NOT DETECTED Final   Escherichia coli NOT DETECTED NOT DETECTED Final   Klebsiella aerogenes NOT DETECTED NOT DETECTED Final   Klebsiella oxytoca NOT DETECTED NOT DETECTED Final   Klebsiella pneumoniae NOT DETECTED NOT DETECTED Final   Proteus species NOT DETECTED NOT DETECTED Final   Salmonella species NOT DETECTED NOT DETECTED Final   Serratia marcescens NOT DETECTED NOT DETECTED Final   Haemophilus influenzae NOT DETECTED NOT DETECTED Final   Neisseria meningitidis NOT DETECTED NOT DETECTED Final   Pseudomonas aeruginosa NOT DETECTED NOT DETECTED Final   Stenotrophomonas maltophilia NOT DETECTED NOT DETECTED Final   Candida albicans NOT DETECTED NOT DETECTED Final   Candida auris NOT DETECTED NOT DETECTED Final   Candida glabrata NOT DETECTED NOT DETECTED Final   Candida krusei NOT DETECTED NOT DETECTED Final   Candida parapsilosis NOT DETECTED NOT DETECTED Final   Candida tropicalis NOT DETECTED NOT DETECTED Final   Cryptococcus neoformans/gattii NOT DETECTED NOT DETECTED Final   Meth resistant mecA/C and MREJ DETECTED (A) NOT DETECTED Final    Comment: CRITICAL RESULT CALLED TO, READ BACK BY AND VERIFIED WITH: PHARMD LYDIA CHEN AT 2045 ON 12/13/20 BY KJ Performed at [facility], [address], [place], [place] [address]   Urine culture     Status: Abnormal   Collection Time: 12/13/20 12:30 AM   Specimen: In/Out Cath Urine  Result Value Ref Range Status   Specimen Description IN/OUT CATH URINE  Final   Special Requests NONE  Final   Culture (A)  Final    <10,000 COLONIES/mL INSIGNIFICANT GROWTH Performed at [facility], [place] [address] [place], [place] [address]    Report Status 12/14/2020 FINAL  Final  MRSA Next Gen by PCR, Nasal      Status: Abnormal   Collection Time: 12/14/20 11:31 AM   Specimen: Nasal Mucosa; Nasal Swab  Result Value Ref Range Status   MRSA by PCR Next Gen DETECTED (A) NOT DETECTED Final    Comment: RESULT CALLED TO, READ BACK BY AND VERIFIED WITH: Tawni Levy RN [id number] 12/14/20 A BROWNING (NOTE) The GeneXpert MRSA Assay (FDA approved for NASAL specimens only), is one component of a comprehensive MRSA colonization surveillance program. It is not intended to diagnose MRSA infection nor to guide or monitor treatment for MRSA infections. Test performance is not FDA  approved in patients less than 11 years old. Performed at [facility], [place] [address] [place], [place] [address]   Culture, blood (routine x 2)     Status: None   Collection Time: 12/15/20  6:54 AM   Specimen: BLOOD  Result Value Ref Range Status   Specimen Description BLOOD LEFT ANTECUBITAL  Final   Special Requests   Final    BOTTLES DRAWN AEROBIC AND ANAEROBIC Blood Culture results may not be optimal due to an excessive volume of blood received in culture bottles   Culture   Final    NO GROWTH 5 DAYS Performed at [facility], [place] [address] [place], [place] [address]    Report Status 12/20/2020 FINAL  Final  Culture, blood (routine x 2)     Status: None   Collection Time: 12/15/20  6:54 AM   Specimen: BLOOD LEFT WRIST  Result Value Ref Range Status   Specimen Description BLOOD LEFT WRIST  Final   Special Requests   Final    BOTTLES DRAWN AEROBIC AND ANAEROBIC Blood Culture results may not be optimal due to an excessive volume of blood received in culture bottles   Culture   Final    NO GROWTH 5 DAYS Performed at [facility], [place] [address], [place], [place] [address]    Report Status 12/20/2020 FINAL  Final      Studies: MR Lumbar Spine W Wo Contrast  Result Date: 12/20/2020 CLINICAL DATA:  Follow-up spinal infection. EXAM: MRI LUMBAR SPINE WITHOUT AND WITH CONTRAST TECHNIQUE: Multiplanar and  multiecho pulse sequences of the lumbar spine were obtained without and with intravenous contrast. CONTRAST:  82m GADAVIST GADOBUTROL 1 MMOL/ML IV SOLN COMPARISON:  12/12/2020 FINDINGS: Segmentation: 5 lumbar type vertebral bodies as numbered previously. Alignment:  No malalignment. Vertebrae: Redemonstration of discitis with early endplate osteomyelitis at the L3-4, L4-5 and L5-S1 levels. No evidence of aggressive endplate destruction since the previous study. See below. Conus medullaris and cauda equina: Conus tip is at the L1-2 level. There is been development of a large ventral epidural abscess present at the upper edge of the scan at the T11 level and extending down to inferior L2. Maximal thickness behind L1 is 8 mm. This displaces thecal sac posteriorly. Additional epidural abscess is present behind L3 and L4, also with mass-effect upon the thecal sac. Paraspinal and other soft tissues: Worsening of paraspinous inflammatory changes affecting both psoas muscles left worse than right. 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 Disc levels: No disc space pathology seen from T11-12 through L1-2. L2-3: Mild bulging of the disc. L3-4 and L4-5: Discitis osteomyelitis as discussed above. Annular bulging. L5-S1: Findings at this level or most consistent with chronic degenerative spondylosis, probably with an element of early disc space infection at this level. Endplate osteophytes and bulging of the disc result in foraminal narrowing right more than left with some potential to affect the exiting right L5 nerve. IMPRESSION: Marked worsening of epidural disease as above, with epidural abscess extending up to the upper edge of the scan, at T11. Extension above that is likely but not characterized. Maximal thickness of the ventral epidural abscess at L1 is 8 mm. Increasing epidural abscess also behind L3 and L4. Worsening paraspinous inflammatory changes left worse than right with frank psoas abscesses now  present on the left. The largest single abscess shows a diameter of 1.7 cm. Continued evidence discitis osteomyelitis definitely at L3-4 and L4-5 and possibly at  L5-S1. Changes at the disc levels themselves are fairly minor since the previous study, without aggressive endplate destruction. Electronically Signed   By: Nelson Chimes M.D.   On: 12/20/2020 14:40    Scheduled Meds:  amLODipine  2.5 mg Oral q morning   aspirin EC  81 mg Oral q morning   atorvastatin  40 mg Oral QHS   Chlorhexidine Gluconate Cloth  6 each Topical Q0600   feeding supplement  237 mL Oral TID BM   gabapentin  200 mg Oral TID   HYDROcodone-acetaminophen  2 tablet Oral Q6H   icosapent Ethyl  2 g Oral BID   insulin aspart  0-15 Units Subcutaneous TID WC   iohexol       lactulose  10 g Oral Daily   losartan  25 mg Oral q morning   metoprolol succinate  25 mg Oral QHS   multivitamin with minerals  1 tablet Oral Daily   mupirocin ointment  1 application Nasal BID   nicotine  21 mg Transdermal Daily   oxyCODONE  20 mg Oral Q12H   pantoprazole  40 mg Oral Daily   sodium chloride flush  3 mL Intravenous Q12H   sorbitol, milk of mag, mineral oil, glycerin (SMOG) enema  960 mL Rectal Once   venlafaxine XR  75 mg Oral Q breakfast    Continuous Infusions:  DAPTOmycin (CUBICIN)  IV Stopped (12/19/20 2107)     LOS: 7 days     Kathie Dike, MD [facility]   12/20/2020, 6:54 PM

## 2020-12-20 NOTE — Consult Note (Signed)
Reason for Consult: Discitis, osteomyelitis, spinal epidural abscess Referring Physician: Dr. Shanon Ace Louis [name] is an 55 y.o. male.  HPI: The patient is a 55 year old white male with a history of multiple MRSA cutaneous abscesses.  He came to the ER on 12/12/2020 complaining of back and leg pain.  He was worked up with a lumbar MRI which demonstrated findings consistent with discitis and osteomyelitis at L3-4 and L4-5.  Dr. Rosalia Hammers, the ER doctor spoke with Dr. Yetta Barre who reviewed the films and recommended medical management with IV antibiotics.  The patient was admitted and started on antibiotics.  The patient has had increasing back pain and increased leukocytosis.  He was worked up with a lumbar MRI with and without contrast today which demonstrated a progressive epidural abscess.  Neurosurgery was recontacted.  Presently the patient is accompanied by his wife.  He complains of low back pain in the lumbar region.  He denies cervical or thoracic pain.  He denies numbness, tingling, weakness, etc.    Past Medical History:  Diagnosis Date   CAD (coronary artery disease)    DM (diabetes mellitus) (HCC)    Hyperlipidemia    Hypertension     History reviewed. No pertinent surgical history.  History reviewed. No pertinent family history.  Social History:  reports that he has been smoking cigarettes. He started smoking about 25 years ago. He has been smoking an average of 2.00 packs per day. He has never used smokeless tobacco. He reports previous alcohol use. He reports that he does not use drugs.  Allergies: No Known Allergies  Medications: I have reviewed the patient's current medications. Prior to Admission:  Medications Prior to Admission  Medication Sig Dispense Refill Last Dose   acetaminophen (TYLENOL) 500 MG tablet Take 2,000 mg by mouth daily as needed (pain).   12/12/2020 at am   ALPRAZolam (XANAX) 0.25 MG tablet Take 0.25 mg by mouth at bedtime as needed for anxiety or sleep.   Past  Month   amLODipine (NORVASC) 2.5 MG tablet Take 2.5 mg by mouth every morning.   12/12/2020 at am   aspirin EC 81 MG tablet Take 81 mg by mouth every morning. Swallow whole.   12/12/2020 at am   atorvastatin (LIPITOR) 80 MG tablet Take 80 mg by mouth at bedtime.   12/11/2020 at pm   dapagliflozin propanediol (FARXIGA) 10 MG TABS tablet Take 10 mg by mouth every morning.   12/12/2020 at am   diclofenac Sodium (VOLTAREN) 1 % GEL Apply 1 application topically 4 (four) times daily as needed (pain).   12/12/2020 at noon   furosemide (LASIX) 20 MG tablet Take 20 mg by mouth every morning.   12/12/2020 at am   gabapentin (NEURONTIN) 100 MG capsule Take 200 mg by mouth 3 (three) times daily as needed (pain).   12/12/2020 at am   glipiZIDE (GLUCOTROL) 5 MG tablet Take 5 mg by mouth 2 (two) times daily.   12/12/2020 at am   HYDROcodone-acetaminophen (NORCO) 10-325 MG tablet Take 0.5-1 tablets by mouth every 8 (eight) hours as needed (pain).   12/12/2020 at noon   icosapent Ethyl (VASCEPA) 1 g capsule Take 2 g by mouth 2 (two) times daily.   12/12/2020 at am   lidocaine (LIDODERM) 5 % Place 1 patch onto the skin daily as needed (pain). Remove & Discard patch within 12 hours or as directed by MD   12/12/2020 at noon   losartan (COZAAR) 25 MG tablet Take 25 mg by mouth  every morning.   12/12/2020 at am   metFORMIN (GLUCOPHAGE) 500 MG tablet Take 500-1,000 mg by mouth See admin instructions. Take 2 tablets (1000 mg) by mouth every morning and 1 tablet (500 mg) at night   12/12/2020 at am   metoprolol succinate (TOPROL-XL) 25 MG 24 hr tablet Take 25 mg by mouth at bedtime.   12/11/2020 at 8:30-9pm   omeprazole (PRILOSEC) 20 MG capsule Take 20 mg by mouth every morning.   12/12/2020 at am   rosuvastatin (CRESTOR) 20 MG tablet Take 20 mg by mouth every Sunday. night   12/05/2020   tiZANidine (ZANAFLEX) 4 MG tablet Take 4 mg by mouth 3 (three) times daily as needed for muscle spasms.   12/12/2020 at am   venlafaxine XR (EFFEXOR-XR)  75 MG 24 hr capsule Take 75 mg by mouth every morning.   12/12/2020 at am   Scheduled:  amLODipine  2.5 mg Oral q morning   aspirin EC  81 mg Oral q morning   atorvastatin  40 mg Oral QHS   Chlorhexidine Gluconate Cloth  6 each Topical Q0600   enoxaparin (LOVENOX) injection  40 mg Subcutaneous Daily   feeding supplement  237 mL Oral TID BM   gabapentin  200 mg Oral TID   HYDROcodone-acetaminophen  2 tablet Oral Q6H   icosapent Ethyl  2 g Oral BID   insulin aspart  0-15 Units Subcutaneous TID WC   iohexol       lactulose  10 g Oral Daily   losartan  25 mg Oral q morning   metoprolol succinate  25 mg Oral QHS   multivitamin with minerals  1 tablet Oral Daily   mupirocin ointment  1 application Nasal BID   nicotine  21 mg Transdermal Daily   oxyCODONE  20 mg Oral Q12H   pantoprazole  40 mg Oral Daily   sodium chloride flush  3 mL Intravenous Q12H   venlafaxine XR  75 mg Oral Q breakfast   Continuous:  DAPTOmycin (CUBICIN)  IV Stopped (12/19/20 2107)   PRN:acetaminophen **OR** acetaminophen, ALPRAZolam, bisacodyl, diclofenac Sodium, HYDROmorphone (DILAUDID) injection, lidocaine, LORazepam, polyethylene glycol, prochlorperazine, tiZANidine Anti-infectives (From admission, onward)    Start     Dose/Rate Route Frequency Ordered Stop   12/15/20 1300  DAPTOmycin (CUBICIN) 750 mg in sodium chloride 0.9 % IVPB        8 mg/kg  93.9 kg 130 mL/hr over 30 Minutes Intravenous Daily 12/15/20 0920     06 /20/22 1000  vancomycin (VANCOREADY) IVPB 1250 mg/250 mL  Status:  Discontinued        1,250 mg 166.7 mL/hr over 90 Minutes Intravenous Every 12 hours 12/12/20 2256 12/15/20 0920   12/12/20 2345  cefTRIAXone (ROCEPHIN) 2 g in sodium chloride 0.9 % 100 mL IVPB  Status:  Discontinued        2 g 200 mL/hr over 30 Minutes Intravenous Every 24 hours 12/12/20 2332 12/13/20 2053   12/12/20 2100  vancomycin (VANCOREADY) IVPB 2000 mg/400 mL        2,000 mg 200 mL/hr over 120 Minutes Intravenous  Once  12/12/20 2056 12/12/20 2330       Results for orders placed or performed during the hospital encounter of 12/12/20 (from the past 48 hour(s))  Glucose, capillary     Status: Abnormal   Collection Time: 12/18/20  5:19 PM  Result Value Ref Range   Glucose-Capillary 172 (H) 70 - 99 mg/dL    Comment: Glucose reference range applies  only to samples taken after fasting for at least 8 hours.  Glucose, capillary     Status: Abnormal   Collection Time: 12/18/20 10:04 PM  Result Value Ref Range   Glucose-Capillary 216 (H) 70 - 99 mg/dL    Comment: Glucose reference range applies only to samples taken after fasting for at least 8 hours.  Creatinine, serum     Status: None   Collection Time: 12/19/20  5:47 AM  Result Value Ref Range   Creatinine, Ser 0.70 0.61 - 1.24 mg/dL   GFR, Estimated >09 >62 mL/min    Comment: (NOTE) Calculated using the CKD-EPI Creatinine Equation (2021) Performed at [facility], [address], [place], [place] [address]   CBC     Status: Abnormal   Collection Time: 12/19/20  5:47 AM  Result Value Ref Range   WBC 17.0 (H) 4.0 - 10.5 K/uL   RBC 4.96 4.22 - 5.81 MIL/uL   Hemoglobin 14.5 13.0 - 17.0 g/dL   HCT 94.7 65.4 - 65.0 %   MCV 86.5 80.0 - 100.0 fL   MCH 29.2 26.0 - 34.0 pg   MCHC 33.8 30.0 - 36.0 g/dL   RDW 35.4 (H) 65.6 - 81.2 %   Platelets 376 150 - 400 K/uL   nRBC 0.0 0.0 - 0.2 %    Comment: Performed at [facility], [address], [place], [place] [address]  Basic metabolic panel     Status: Abnormal   Collection Time: 12/19/20  5:47 AM  Result Value Ref Range   Sodium 131 (L) 135 - 145 mmol/L   Potassium 3.6 3.5 - 5.1 mmol/L   Chloride 99 98 - 111 mmol/L   CO2 20 (L) 22 - 32 mmol/L   Glucose, Bld 201 (H) 70 - 99 mg/dL    Comment: Glucose reference range applies only to samples taken after fasting for at least 8 hours.   BUN 14 6 - 20 mg/dL   Creatinine, Ser 0.17 0.61 - 1.24 mg/dL   Calcium 8.2 (L) 8.9 - 10.3 mg/dL   GFR,  Estimated >49 >44 mL/min    Comment: (NOTE) Calculated using the CKD-EPI Creatinine Equation (2021)    Anion gap 12 5 - 15    Comment: Performed at [facility], [address] [place], [place] [address]  Glucose, capillary     Status: Abnormal   Collection Time: 12/19/20  6:37 AM  Result Value Ref Range   Glucose-Capillary 212 (H) 70 - 99 mg/dL    Comment: Glucose reference range applies only to samples taken after fasting for at least 8 hours.  Glucose, capillary     Status: Abnormal   Collection Time: 12/19/20 12:08 PM  Result Value Ref Range   Glucose-Capillary 201 (H) 70 - 99 mg/dL    Comment: Glucose reference range applies only to samples taken after fasting for at least 8 hours.  Glucose, capillary     Status: Abnormal   Collection Time: 12/19/20  4:32 PM  Result Value Ref Range   Glucose-Capillary 163 (H) 70 - 99 mg/dL    Comment: Glucose reference range applies only to samples taken after fasting for at least 8 hours.  Glucose, capillary     Status: Abnormal   Collection Time: 12/19/20  8:52 PM  Result Value Ref Range   Glucose-Capillary 167 (H) 70 - 99 mg/dL    Comment: Glucose reference range applies only to samples taken after fasting for at least 8  hours.  CBC     Status: Abnormal   Collection Time: 12/20/20 12:28 AM  Result Value Ref Range   WBC 23.2 (H) 4.0 - 10.5 K/uL   RBC 5.02 4.22 - 5.81 MIL/uL   Hemoglobin 14.9 13.0 - 17.0 g/dL   HCT 65.4 65.0 - 35.4 %   MCV 84.7 80.0 - 100.0 fL   MCH 29.7 26.0 - 34.0 pg   MCHC 35.1 30.0 - 36.0 g/dL   RDW 65.6 (H) 81.2 - 75.1 %   Platelets 408 (H) 150 - 400 K/uL   nRBC 0.0 0.0 - 0.2 %    Comment: Performed at [facility], [address], [place], [place] [address]  Basic metabolic panel     Status: Abnormal   Collection Time: 12/20/20 12:28 AM  Result Value Ref Range   Sodium 134 (L) 135 - 145 mmol/L   Potassium 3.3 (L) 3.5 - 5.1 mmol/L   Chloride 99 98 - 111 mmol/L   CO2 26 22 - 32 mmol/L   Glucose,  Bld 175 (H) 70 - 99 mg/dL    Comment: Glucose reference range applies only to samples taken after fasting for at least 8 hours.   BUN 10 6 - 20 mg/dL   Creatinine, Ser 4.94 0.61 - 1.24 mg/dL   Calcium 8.2 (L) 8.9 - 10.3 mg/dL   GFR, Estimated >49 >67 mL/min    Comment: (NOTE) Calculated using the CKD-EPI Creatinine Equation (2021)    Anion gap 9 5 - 15    Comment: Performed at [facility], [address] [place], [place] [address]  Glucose, capillary     Status: Abnormal   Collection Time: 12/20/20  6:41 AM  Result Value Ref Range   Glucose-Capillary 188 (H) 70 - 99 mg/dL    Comment: Glucose reference range applies only to samples taken after fasting for at least 8 hours.  Glucose, capillary     Status: Abnormal   Collection Time: 12/20/20 11:36 AM  Result Value Ref Range   Glucose-Capillary 150 (H) 70 - 99 mg/dL    Comment: Glucose reference range applies only to samples taken after fasting for at least 8 hours.  Glucose, capillary     Status: Abnormal   Collection Time: 12/20/20  4:13 PM  Result Value Ref Range   Glucose-Capillary 152 (H) 70 - 99 mg/dL    Comment: Glucose reference range applies only to samples taken after fasting for at least 8 hours.    MR Lumbar Spine W Wo Contrast  Result Date: 12/20/2020 CLINICAL DATA:  Follow-up spinal infection. EXAM: MRI LUMBAR SPINE WITHOUT AND WITH CONTRAST TECHNIQUE: Multiplanar and multiecho pulse sequences of the lumbar spine were obtained without and with intravenous contrast. CONTRAST:  34mL GADAVIST GADOBUTROL 1 MMOL/ML IV SOLN COMPARISON:  12/12/2020 FINDINGS: Segmentation: 5 lumbar type vertebral bodies as numbered previously. Alignment:  No malalignment. Vertebrae: Redemonstration of discitis with early endplate osteomyelitis at the L3-4, L4-5 and L5-S1 levels. No evidence of aggressive endplate destruction since the previous study. See below. Conus medullaris and cauda equina: Conus tip is at the L1-2 level. There is been  development of a large ventral epidural abscess present at the upper edge of the scan at the T11 level and extending down to inferior L2. Maximal thickness behind L1 is 8 mm. This displaces thecal sac posteriorly. Additional epidural abscess is present behind L3 and L4, also with mass-effect upon the thecal sac. Paraspinal and other soft tissues: Worsening of  paraspinous inflammatory changes affecting both psoas muscles left worse than right. 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 Disc levels: No disc space pathology seen from T11-12 through L1-2. L2-3: Mild bulging of the disc. L3-4 and L4-5: Discitis osteomyelitis as discussed above. Annular bulging. L5-S1: Findings at this level or most consistent with chronic degenerative spondylosis, probably with an element of early disc space infection at this level. Endplate osteophytes and bulging of the disc result in foraminal narrowing right more than left with some potential to affect the exiting right L5 nerve. IMPRESSION: Marked worsening of epidural disease as above, with epidural abscess extending up to the upper edge of the scan, at T11. Extension above that is likely but not characterized. Maximal thickness of the ventral epidural abscess at L1 is 8 mm. Increasing epidural abscess also behind L3 and L4. Worsening paraspinous inflammatory changes left worse than right with frank psoas abscesses now present on the left. The largest single abscess shows a diameter of 1.7 cm. 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 Changes at the disc levels themselves are fairly minor since the previous study, without aggressive endplate destruction. Electronically Signed   By: Paulina FusiMark  Shogry M.D.   On: 12/20/2020 14:40    ROS: As above Blood pressure (!) 152/103, pulse (!) 110, temperature 98.5 F (36.9 C), temperature source Oral, resp. rate 16, height 5\' 11"  (1.803 m), weight 92.9 kg, SpO2 93 %. Estimated body  mass index is 28.56 kg/m as calculated from the following:   Height as of this encounter: 5\' 11"  (1.803 m).   Weight as of this encounter: 92.9 kg.  Physical Exam  General: A pleasant uncomfortable appearing 55 year old white male.  HEENT: Normocephalic  Thorax: Symmetric  Abdomen: Soft  Back exam: The patient complains of pain in the lumbar region.  Neurologic exam : The patient is alert and oriented.  The patient's motor strength is grossly normal in his bilateral iliopsoas, quadricep, gastrocnemius and dorsiflexors.  He maintains normal sensation to light touch in his bilateral lower extremities.  Imaging studies I reviewed the patient's lumbar MRI performed today with and without contrast.  He has finding consistent with discitis and osteomyelitis at L3-4 and L4-5.  He has epidural abscess predominantly ventrally and the L3 and L4 vertebral body with moderate stenosis.  He also has ventral epidural abscess extending up to at least T11 without significant compression of the thoracic cord.  Assessment/Plan: Lumbar discitis, osteomyelitis, epidural abscess, thoracic ventral epidural abscess, low back pain: I have discussed the situation with the patient, his wife, and Dr. Yetta BarreJones.  Dr. Yetta BarreJones and I feel that it would be very helpful to image his thoracic spine as the abscess is not fully included on his lumbar MRI.  We are told that he cannot get a thoracic MRI with and without contrast until early tomorrow morning.  Since his strength and sensation are presently normal I think we can wait until then.  I think he will come to need spine surgery, but we will need to wait till we get a thoracic MRI to know which levels to operate on.  We will make that determination after his thoracic MRI.  In the meantime he may be helped to drain his psoas abscess.  I have answered all their questions.  Louis [name] 12/20/2020, 4:23 PM

## 2020-12-20 NOTE — Progress Notes (Signed)
Ok to give KCL for k+3.3 per Dr. Kerry Hough.  Ulyses Southward, PharmD, BCIDP, AAHIVP, CPP Infectious Disease Pharmacist 12/20/2020 1:18 PM

## 2020-12-20 NOTE — Plan of Care (Signed)
Pt was very drowsy with a POSS of 2 most of the night.  He was unable to tell me the date, but knew he was in the hospital.  He opened his eyes on command and followed directions.  Pt received first Oxy ER dose at 1800, scheduled Norco at 2210 along with Tizanidine and Voltaren gel.  He was still unable to lie still and required Dilaudid IV x 1 dose at MN.  He has rested well since then with respirations @16 /min and pt noted to have changed body positions on hourly rounding.  Pharmacy rescheduled Oxy ER doses to be given at 8a and 8p so it would not fall due at the same time as the scheduled Norco.    BSN RN CMSRN    Problem: Pain Managment: Goal: General experience of comfort will improve Outcome: Not Progressing

## 2020-12-20 NOTE — Progress Notes (Signed)
[facility]  Date of Admission:  12/12/2020     Total days of antibiotics 9         ASSESSMENT:  Louis [name] has increasing leukocytosis without fevers or increases in pain. Recommend repeat MRI of the lumbar spine and CT abdomen/pelvis to check for any new fluid collections in the setting of his osteomyelitis. Pain remains in lumbar spine and psoas area. Pain management remains a challenge. Continue current dose of daptomycin with pain management per primary team.   PLAN:  Continue current dose of daptomycin.  Monitor CK levels for therapeutic drug monitoring.  Recommend lumbar spine MRI and CT abdomen/pelvis for identification of any new abscesses. Consider decreasing CBC checks to weekly.   Principal Problem:   MRSA bacteremia Active Problems:   CAD (coronary artery disease)   Dyslipidemia   HTN (hypertension)   Uncontrolled diabetes mellitus (HCC)   Vertebral osteomyelitis, acute (HCC)   Tachycardia   Overweight (BMI 25.0-29.9)   Constipated   Back pain    amLODipine  2.5 mg Oral q morning   aspirin EC  81 mg Oral q morning   atorvastatin  40 mg Oral QHS   Chlorhexidine Gluconate Cloth  6 each Topical Q0600   enoxaparin (LOVENOX) injection  40 mg Subcutaneous Daily   feeding supplement  237 mL Oral TID BM   gabapentin  200 mg Oral TID   HYDROcodone-acetaminophen  2 tablet Oral Q6H   icosapent Ethyl  2 g Oral BID   insulin aspart  0-15 Units Subcutaneous TID WC   lactulose  10 g Oral Daily   losartan  25 mg Oral q morning   metoprolol succinate  25 mg Oral QHS   multivitamin with minerals  1 tablet Oral Daily   mupirocin ointment  1 application Nasal BID   nicotine  21 mg Transdermal Daily   oxyCODONE  20 mg Oral Q12H   pantoprazole  40 mg Oral Daily   sodium chloride flush  3 mL Intravenous Q12H   venlafaxine XR  75 mg Oral Q breakfast    SUBJECTIVE:  Afebrile overnight with no acute events. Increasing WBC count up to 23.2.  Continues to have right sided lumbar back pain and right psoas pain.   No Known Allergies   Review of Systems: Review of Systems  Constitutional:  Negative for chills, fever and weight loss.  Respiratory:  Negative for cough, shortness of breath and wheezing.   Cardiovascular:  Negative for chest pain and leg swelling.  Gastrointestinal:  Negative for abdominal pain, constipation, diarrhea, nausea and vomiting.  Musculoskeletal:  Positive for back pain.  Skin:  Negative for rash.     OBJECTIVE: Vitals:   12/19/20 2028 12/19/20 2357 12/20/20 0332 12/20/20 0500  BP: (!) 154/101 (!) 172/101 (!) 152/103   Pulse: (!) 110 (!) 110 (!) 110   Resp: 18 18 16    Temp: 98 F (36.7 C) 98.2 F (36.8 C) 98.5 F (36.9 C)   TempSrc: Oral Oral Oral   SpO2: 95% 93% 93%   Weight:    92.9 kg  Height:       Body mass index is 28.56 kg/m.  Physical Exam Constitutional:      General: He is not in acute distress.    Appearance: He is well-developed.  Cardiovascular:     Rate and Rhythm: Normal rate and regular rhythm.     Heart sounds: Normal heart sounds.  Pulmonary:     Effort:  Pulmonary effort is normal.     Breath sounds: Normal breath sounds.  Musculoskeletal:     Comments: Back with no obvious deformity, discoloration or edema.   Skin:    General: Skin is warm and dry.  Neurological:     Mental Status: He is alert and oriented to person, place, and time.  Psychiatric:        Behavior: Behavior normal.        Thought Content: Thought content normal.        Judgment: Judgment normal.    Lab Results Lab Results  Component Value Date   WBC 23.2 (H) 12/20/2020   HGB 14.9 12/20/2020   HCT 42.5 12/20/2020   MCV 84.7 12/20/2020   PLT 408 (H) 12/20/2020    Lab Results  Component Value Date   CREATININE 0.64 12/20/2020   BUN 10 12/20/2020   NA 134 (L) 12/20/2020   K 3.3 (L) 12/20/2020   CL 99 12/20/2020   CO2 26 12/20/2020    Lab Results  Component Value Date   ALT 38  12/17/2020   AST 78 (H) 12/17/2020   ALKPHOS 97 12/17/2020   BILITOT 0.9 12/17/2020     Microbiology: Recent Results (from the past 240 hour(s))  SARS CORONAVIRUS 2 (TAT 6-24 HRS) Nasopharyngeal Nasopharyngeal Swab     Status: None   Collection Time: 12/12/20 11:09 PM   Specimen: Nasopharyngeal Swab  Result Value Ref Range Status   SARS Coronavirus 2 NEGATIVE NEGATIVE Final    Comment: (NOTE) SARS-CoV-2 target nucleic acids are NOT DETECTED.  The SARS-CoV-2 RNA is generally detectable in upper and lower respiratory specimens during the acute phase of infection. Negative results do not preclude SARS-CoV-2 infection, do not rule out co-infections with other pathogens, and should not be used as the sole basis for treatment or other patient management decisions. Negative results must be combined with clinical observations, patient history, and epidemiological information. The expected result is Negative.  Fact Sheet for Patients: HairSlick.no  Fact Sheet for Healthcare Providers: quierodirigir.com  This test is not yet approved or cleared by the [place] FDA and  has been authorized for detection and/or diagnosis of SARS-CoV-2 by FDA under an Emergency Use Authorization (EUA). This EUA will remain  in effect (meaning this test can be used) for the duration of the COVID-19 declaration under Se ction 564(b)(1) of the Act, 21 U.S.C. section 360bbb-3(b)(1), unless the authorization is terminated or revoked sooner.  Performed at [facility], [address] [place], [place] [address]   Blood culture (routine single)     Status: Abnormal   Collection Time: 12/13/20 12:25 AM   Specimen: BLOOD LEFT FOREARM  Result Value Ref Range Status   Specimen Description BLOOD LEFT FOREARM  Final   Special Requests   Final    BOTTLES DRAWN AEROBIC AND ANAEROBIC Blood Culture results may not be optimal due to an inadequate volume  of blood received in culture bottles   Culture  Setup Time   Final    GRAM POSITIVE COCCI IN BOTH AEROBIC AND ANAEROBIC BOTTLES Organism ID to follow CRITICAL RESULT CALLED TO, READ BACK BY AND VERIFIED WITH: PHARMD LYDIA CHEN AT 2041 ON 12/13/20 BY KJ Performed at [facility], [address] [place], [place] [address]    Culture METHICILLIN RESISTANT STAPHYLOCOCCUS AUREUS (A)  Final   Report Status 12/15/2020 FINAL  Final   Organism ID, Bacteria METHICILLIN RESISTANT STAPHYLOCOCCUS AUREUS  Final  Susceptibility   Methicillin resistant staphylococcus aureus - MIC*    CIPROFLOXACIN >=8 RESISTANT Resistant     ERYTHROMYCIN >=8 RESISTANT Resistant     GENTAMICIN <=0.5 SENSITIVE Sensitive     OXACILLIN >=4 RESISTANT Resistant     TETRACYCLINE <=1 SENSITIVE Sensitive     VANCOMYCIN 1 SENSITIVE Sensitive     TRIMETH/SULFA 80 RESISTANT Resistant     CLINDAMYCIN <=0.25 SENSITIVE Sensitive     RIFAMPIN <=0.5 SENSITIVE Sensitive     Inducible Clindamycin NEGATIVE Sensitive     * METHICILLIN RESISTANT STAPHYLOCOCCUS AUREUS  Blood Culture ID Panel (Reflexed)     Status: Abnormal   Collection Time: 12/13/20 12:25 AM  Result Value Ref Range Status   Enterococcus faecalis NOT DETECTED NOT DETECTED Final   Enterococcus Faecium NOT DETECTED NOT DETECTED Final   Listeria monocytogenes NOT DETECTED NOT DETECTED Final   Staphylococcus species DETECTED (A) NOT DETECTED Final    Comment: CRITICAL RESULT CALLED TO, READ BACK BY AND VERIFIED WITH: PHARMD LYDIA CHEN AT 2045 ON 12/13/20 BY KJ    Staphylococcus aureus (BCID) DETECTED (A) NOT DETECTED Final    Comment: Methicillin (oxacillin)-resistant Staphylococcus aureus (MRSA). MRSA is predictably resistant to beta-lactam antibiotics (except ceftaroline). Preferred therapy is vancomycin unless clinically contraindicated. Patient requires contact precautions if  hospitalized. CRITICAL RESULT CALLED TO, READ BACK BY AND VERIFIED WITH: PHARMD  LYDIA CHEN AT 2045 ON 12/13/20 BY KJ    Staphylococcus epidermidis NOT DETECTED NOT DETECTED Final   Staphylococcus lugdunensis NOT DETECTED NOT DETECTED Final   Streptococcus species NOT DETECTED NOT DETECTED Final   Streptococcus agalactiae NOT DETECTED NOT DETECTED Final   Streptococcus pneumoniae NOT DETECTED NOT DETECTED Final   Streptococcus pyogenes NOT DETECTED NOT DETECTED Final   A.calcoaceticus-baumannii NOT DETECTED NOT DETECTED Final   Bacteroides fragilis NOT DETECTED NOT DETECTED Final   Enterobacterales NOT DETECTED NOT DETECTED Final   Enterobacter cloacae complex NOT DETECTED NOT DETECTED Final   Escherichia coli NOT DETECTED NOT DETECTED Final   Klebsiella aerogenes NOT DETECTED NOT DETECTED Final   Klebsiella oxytoca NOT DETECTED NOT DETECTED Final   Klebsiella pneumoniae NOT DETECTED NOT DETECTED Final   Proteus species NOT DETECTED NOT DETECTED Final   Salmonella species NOT DETECTED NOT DETECTED Final   Serratia marcescens NOT DETECTED NOT DETECTED Final   Haemophilus influenzae NOT DETECTED NOT DETECTED Final   Neisseria meningitidis NOT DETECTED NOT DETECTED Final   Pseudomonas aeruginosa NOT DETECTED NOT DETECTED Final   Stenotrophomonas maltophilia NOT DETECTED NOT DETECTED Final   Candida albicans NOT DETECTED NOT DETECTED Final   Candida auris NOT DETECTED NOT DETECTED Final   Candida glabrata NOT DETECTED NOT DETECTED Final   Candida krusei NOT DETECTED NOT DETECTED Final   Candida parapsilosis NOT DETECTED NOT DETECTED Final   Candida tropicalis NOT DETECTED NOT DETECTED Final   Cryptococcus neoformans/gattii NOT DETECTED NOT DETECTED Final   Meth resistant mecA/C and MREJ DETECTED (A) NOT DETECTED Final    Comment: CRITICAL RESULT CALLED TO, READ BACK BY AND VERIFIED WITH: PHARMD LYDIA CHEN AT 2045 ON 12/13/20 BY KJ Performed at [facility], [address] [place], [place] [address]   Urine culture     Status: Abnormal   Collection Time:  12/13/20 12:30 AM   Specimen: In/Out Cath Urine  Result Value Ref Range Status   Specimen Description IN/OUT CATH URINE  Final   Special Requests NONE  Final   Culture (A)  Final    <10,000  COLONIES/mL INSIGNIFICANT GROWTH Performed at [facility], [address], [place], [place] [address]    Report Status 12/14/2020 FINAL  Final  MRSA Next Gen by PCR, Nasal     Status: Abnormal   Collection Time: 12/14/20 11:31 AM   Specimen: Nasal Mucosa; Nasal Swab  Result Value Ref Range Status   MRSA by PCR Next Gen DETECTED (A) NOT DETECTED Final    Comment: RESULT CALLED TO, READ BACK BY AND VERIFIED WITH: Kennith Gain RN [id number] 12/14/20 A BROWNING (NOTE) The GeneXpert MRSA Assay (FDA approved for NASAL specimens only), is one component of a comprehensive MRSA colonization surveillance program. It is not intended to diagnose MRSA infection nor to guide or monitor treatment for MRSA infections. Test performance is not FDA approved in patients less than 25 years old. Performed at [facility], [address], [place], [place] [address]   Culture, blood (routine x 2)     Status: None   Collection Time: 12/15/20  6:54 AM   Specimen: BLOOD  Result Value Ref Range Status   Specimen Description BLOOD LEFT ANTECUBITAL  Final   Special Requests   Final    BOTTLES DRAWN AEROBIC AND ANAEROBIC Blood Culture results may not be optimal due to an excessive volume of blood received in culture bottles   Culture   Final    NO GROWTH 5 DAYS Performed at [facility], [address], [place], [place] [address]    Report Status 12/20/2020 FINAL  Final  Culture, blood (routine x 2)     Status: None   Collection Time: 12/15/20  6:54 AM   Specimen: BLOOD LEFT WRIST  Result Value Ref Range Status   Specimen Description BLOOD LEFT WRIST  Final   Special Requests   Final    BOTTLES DRAWN AEROBIC AND ANAEROBIC Blood Culture results may not be optimal due to an excessive volume of blood received  in culture bottles   Culture   Final    NO GROWTH 5 DAYS Performed at [facility], [address] [place], [place] [address]    Report Status 12/20/2020 FINAL  Final     Marcos Eke, NP [facility]  12/20/2020  11:14 AM

## 2020-12-21 ENCOUNTER — Inpatient Hospital Stay (HOSPITAL_COMMUNITY): Payer: Federal, State, Local not specified - PPO

## 2020-12-21 ENCOUNTER — Encounter (HOSPITAL_COMMUNITY)
Admission: EM | Disposition: A | Discharge status: Home Health | Payer: Self-pay | Source: Other Acute Inpatient Hospital | Attending: Internal Medicine

## 2020-12-21 ENCOUNTER — Encounter (HOSPITAL_COMMUNITY): Payer: Self-pay | Admitting: Internal Medicine

## 2020-12-21 HISTORY — PX: LUMBAR LAMINECTOMY FOR EPIDURAL ABSCESS: SHX5956

## 2020-12-21 LAB — SURGICAL PCR SCREEN
MRSA, PCR: POSITIVE — AB
Staphylococcus aureus: POSITIVE — AB

## 2020-12-21 LAB — GLUCOSE, CAPILLARY
Glucose-Capillary: 185 mg/dL — ABNORMAL HIGH (ref 70–99)
Glucose-Capillary: 162 mg/dL — ABNORMAL HIGH (ref 70–99)
Glucose-Capillary: 219 mg/dL — ABNORMAL HIGH (ref 70–99)
Glucose-Capillary: 258 mg/dL — ABNORMAL HIGH (ref 70–99)

## 2020-12-21 SURGERY — LUMBAR LAMINECTOMY FOR EPIDURAL ABSCESS
Anesthesia: General | Site: Back

## 2020-12-21 MED ORDER — ONDANSETRON HCL 4 MG/2ML IJ SOLN
INTRAMUSCULAR | Status: DC | PRN
  Administered 2020-12-21: 4 mg via INTRAVENOUS

## 2020-12-21 MED ORDER — HYDROCODONE-ACETAMINOPHEN 10-325 MG PO TABS
1.0000 | ORAL_TABLET | ORAL | Status: DC | PRN
  Administered 2020-12-22: 1 via ORAL
  Filled 2020-12-21: qty 1

## 2020-12-21 MED ORDER — LIDOCAINE-EPINEPHRINE 1 %-1:100000 IJ SOLN
INTRAMUSCULAR | Status: AC
  Filled 2020-12-21: qty 1

## 2020-12-21 MED ORDER — LIDOCAINE 2% (20 MG/ML) 5 ML SYRINGE
INTRAMUSCULAR | Status: AC
  Filled 2020-12-21: qty 5

## 2020-12-21 MED ORDER — ORAL CARE MOUTH RINSE
15.0000 mL | Freq: Once | OROMUCOSAL | Status: AC
Start: 2020-12-21 — End: 2020-12-21

## 2020-12-21 MED ORDER — ROCURONIUM BROMIDE 10 MG/ML (PF) SYRINGE
PREFILLED_SYRINGE | INTRAVENOUS | Status: AC
  Filled 2020-12-21: qty 10

## 2020-12-21 MED ORDER — ROCURONIUM BROMIDE 10 MG/ML (PF) SYRINGE
PREFILLED_SYRINGE | INTRAVENOUS | Status: DC | PRN
  Administered 2020-12-21 (×3): 10 mg via INTRAVENOUS
  Administered 2020-12-21: 70 mg via INTRAVENOUS

## 2020-12-21 MED ORDER — THROMBIN 5000 UNITS EX SOLR
CUTANEOUS | Status: AC
  Filled 2020-12-21: qty 5000

## 2020-12-21 MED ORDER — CHLORHEXIDINE GLUCONATE 0.12 % MT SOLN
15.0000 mL | Freq: Once | OROMUCOSAL | Status: AC
Start: 2020-12-21 — End: 2020-12-21

## 2020-12-21 MED ORDER — FENTANYL CITRATE (PF) 250 MCG/5ML IJ SOLN
INTRAMUSCULAR | Status: DC | PRN
  Administered 2020-12-21: 25 ug via INTRAVENOUS
  Administered 2020-12-21: 100 ug via INTRAVENOUS
  Administered 2020-12-21: 25 ug via INTRAVENOUS
  Administered 2020-12-21: 50 ug via INTRAVENOUS

## 2020-12-21 MED ORDER — ONDANSETRON HCL 4 MG PO TABS: 4.0000 mg | ORAL_TABLET | Freq: Four times a day (QID) | ORAL | Status: DC | PRN

## 2020-12-21 MED ORDER — MIDAZOLAM HCL 2 MG/2ML IJ SOLN
INTRAMUSCULAR | Status: AC
  Filled 2020-12-21: qty 2

## 2020-12-21 MED ORDER — LACTATED RINGERS IV SOLN: INTRAVENOUS | Status: DC

## 2020-12-21 MED ORDER — DEXAMETHASONE SODIUM PHOSPHATE 10 MG/ML IJ SOLN
INTRAMUSCULAR | Status: AC
  Filled 2020-12-21: qty 1

## 2020-12-21 MED ORDER — DEXAMETHASONE SODIUM PHOSPHATE 10 MG/ML IJ SOLN
INTRAMUSCULAR | Status: DC | PRN
  Administered 2020-12-21: 4 mg via INTRAVENOUS

## 2020-12-21 MED ORDER — CHLORHEXIDINE GLUCONATE 0.12 % MT SOLN
OROMUCOSAL | Status: AC
  Administered 2020-12-21: 15 mL via OROMUCOSAL
  Filled 2020-12-21: qty 15

## 2020-12-21 MED ORDER — 0.9 % SODIUM CHLORIDE (POUR BTL) OPTIME
TOPICAL | Status: DC | PRN
  Administered 2020-12-21: 1000 mL

## 2020-12-21 MED ORDER — ONDANSETRON HCL 4 MG/2ML IJ SOLN
4.0000 mg | Freq: Four times a day (QID) | INTRAMUSCULAR | Status: DC | PRN
  Administered 2020-12-22 – 2020-12-26 (×4): 4 mg via INTRAVENOUS
  Filled 2020-12-21 (×5): qty 2

## 2020-12-21 MED ORDER — PROPOFOL 10 MG/ML IV BOLUS
INTRAVENOUS | Status: DC | PRN
  Administered 2020-12-21: 180 mg via INTRAVENOUS

## 2020-12-21 MED ORDER — PHENYLEPHRINE 40 MCG/ML (10ML) SYRINGE FOR IV PUSH (FOR BLOOD PRESSURE SUPPORT)
PREFILLED_SYRINGE | INTRAVENOUS | Status: AC
  Filled 2020-12-21: qty 10

## 2020-12-21 MED ORDER — SODIUM CHLORIDE 0.9% FLUSH: 3.0000 mL | INTRAVENOUS | Status: DC | PRN

## 2020-12-21 MED ORDER — LIDOCAINE 2% (20 MG/ML) 5 ML SYRINGE
INTRAMUSCULAR | Status: DC | PRN
  Administered 2020-12-21: 100 mg via INTRAVENOUS

## 2020-12-21 MED ORDER — ONDANSETRON HCL 4 MG/2ML IJ SOLN
INTRAMUSCULAR | Status: AC
  Filled 2020-12-21: qty 2

## 2020-12-21 MED ORDER — FENTANYL CITRATE (PF) 250 MCG/5ML IJ SOLN
INTRAMUSCULAR | Status: AC
  Filled 2020-12-21: qty 5

## 2020-12-21 MED ORDER — PROPOFOL 10 MG/ML IV BOLUS
INTRAVENOUS | Status: AC
  Filled 2020-12-21: qty 40

## 2020-12-21 MED ORDER — LACTATED RINGERS IV SOLN: INTRAVENOUS | Status: DC | PRN

## 2020-12-21 MED ORDER — VANCOMYCIN HCL 1000 MG IV SOLR
INTRAVENOUS | Status: AC
  Filled 2020-12-21: qty 1000

## 2020-12-21 MED ORDER — ENOXAPARIN SODIUM 40 MG/0.4ML IJ SOSY
40.0000 mg | PREFILLED_SYRINGE | INTRAMUSCULAR | Status: DC
  Administered 2020-12-23 – 2020-12-24 (×2): 40 mg via SUBCUTANEOUS
  Filled 2020-12-21 (×2): qty 0.4

## 2020-12-21 MED ORDER — PHENYLEPHRINE HCL-NACL 10-0.9 MG/250ML-% IV SOLN
INTRAVENOUS | Status: AC
  Filled 2020-12-21: qty 250

## 2020-12-21 MED ORDER — FENTANYL CITRATE (PF) 100 MCG/2ML IJ SOLN
INTRAMUSCULAR | Status: AC
  Filled 2020-12-21: qty 2

## 2020-12-21 MED ORDER — FENTANYL CITRATE (PF) 100 MCG/2ML IJ SOLN
25.0000 ug | INTRAMUSCULAR | Status: DC | PRN
  Administered 2020-12-21 (×2): 50 ug via INTRAVENOUS

## 2020-12-21 MED ORDER — PHENOL 1.4 % MT LIQD: 1.0000 | OROMUCOSAL | Status: DC | PRN

## 2020-12-21 MED ORDER — BUPIVACAINE-EPINEPHRINE 0.5% -1:200000 IJ SOLN
INTRAMUSCULAR | Status: AC
  Filled 2020-12-21: qty 1

## 2020-12-21 MED ORDER — VANCOMYCIN HCL 1000 MG IV SOLR
INTRAVENOUS | Status: DC | PRN
  Administered 2020-12-21: 1000 mg via TOPICAL

## 2020-12-21 MED ORDER — MIDAZOLAM HCL 5 MG/5ML IJ SOLN
INTRAMUSCULAR | Status: DC | PRN
  Administered 2020-12-21: 2 mg via INTRAVENOUS

## 2020-12-21 MED ORDER — HYDROMORPHONE HCL 1 MG/ML IJ SOLN
0.5000 mg | INTRAMUSCULAR | Status: DC | PRN
  Administered 2020-12-22 – 2020-12-23 (×5): 0.5 mg via INTRAMUSCULAR
  Filled 2020-12-21 (×6): qty 1

## 2020-12-21 MED ORDER — METHOCARBAMOL 1000 MG/10ML IJ SOLN
500.0000 mg | Freq: Four times a day (QID) | INTRAVENOUS | Status: DC | PRN
  Filled 2020-12-21: qty 5

## 2020-12-21 MED ORDER — SODIUM CHLORIDE 0.9% FLUSH
3.0000 mL | Freq: Two times a day (BID) | INTRAVENOUS | Status: DC
  Administered 2020-12-21 – 2020-12-26 (×9): 3 mL via INTRAVENOUS

## 2020-12-21 MED ORDER — GADOBUTROL 1 MMOL/ML IV SOLN
10.0000 mL | Freq: Once | INTRAVENOUS | Status: AC | PRN
  Administered 2020-12-21: 10 mL via INTRAVENOUS

## 2020-12-21 MED ORDER — THROMBIN 5000 UNITS EX SOLR: OROMUCOSAL | Status: DC | PRN

## 2020-12-21 MED ORDER — METHOCARBAMOL 500 MG PO TABS
500.0000 mg | ORAL_TABLET | Freq: Four times a day (QID) | ORAL | Status: DC | PRN
  Administered 2020-12-21 – 2020-12-26 (×5): 500 mg via ORAL
  Filled 2020-12-21 (×6): qty 1

## 2020-12-21 MED ORDER — THROMBIN 20000 UNITS EX SOLR
CUTANEOUS | Status: AC
  Filled 2020-12-21: qty 20000

## 2020-12-21 MED ORDER — SUGAMMADEX SODIUM 200 MG/2ML IV SOLN
INTRAVENOUS | Status: DC | PRN
  Administered 2020-12-21: 200 mg via INTRAVENOUS

## 2020-12-21 MED ORDER — SODIUM CHLORIDE 0.9 % IV SOLN
250.0000 mL | INTRAVENOUS | Status: DC
  Administered 2020-12-21: 250 mL via INTRAVENOUS

## 2020-12-21 MED ORDER — PHENYLEPHRINE HCL-NACL 10-0.9 MG/250ML-% IV SOLN
INTRAVENOUS | Status: DC | PRN
  Administered 2020-12-21: 40 ug/min via INTRAVENOUS

## 2020-12-21 MED ORDER — ACETAMINOPHEN 10 MG/ML IV SOLN: 1000.0000 mg | Freq: Once | INTRAVENOUS | Status: DC | PRN

## 2020-12-21 MED ORDER — OXYCODONE HCL 5 MG PO TABS
10.0000 mg | ORAL_TABLET | ORAL | Status: DC | PRN
Start: 2020-12-21 — End: 2020-12-23
  Administered 2020-12-22 – 2020-12-23 (×2): 10 mg via ORAL
  Filled 2020-12-21 (×2): qty 2

## 2020-12-21 MED ORDER — THROMBIN 20000 UNITS EX SOLR: CUTANEOUS | Status: DC | PRN

## 2020-12-21 MED ORDER — HYDROMORPHONE HCL 1 MG/ML IJ SOLN
1.0000 mg | INTRAMUSCULAR | Status: AC
  Administered 2020-12-21: 1 mg via INTRAVENOUS
  Filled 2020-12-21: qty 1

## 2020-12-21 MED ORDER — EPHEDRINE 5 MG/ML INJ
INTRAVENOUS | Status: AC
  Filled 2020-12-21: qty 10

## 2020-12-21 MED ORDER — PROMETHAZINE HCL 25 MG/ML IJ SOLN: 6.2500 mg | INTRAMUSCULAR | Status: DC | PRN

## 2020-12-21 MED ORDER — MENTHOL 3 MG MT LOZG: 1.0000 | LOZENGE | OROMUCOSAL | Status: DC | PRN

## 2020-12-21 MED ORDER — PHENYLEPHRINE 40 MCG/ML (10ML) SYRINGE FOR IV PUSH (FOR BLOOD PRESSURE SUPPORT)
PREFILLED_SYRINGE | INTRAVENOUS | Status: DC | PRN
  Administered 2020-12-21 (×2): 120 ug via INTRAVENOUS
  Administered 2020-12-21: 40 ug via INTRAVENOUS
  Administered 2020-12-21: 80 ug via INTRAVENOUS
  Administered 2020-12-21: 40 ug via INTRAVENOUS

## 2020-12-21 SURGICAL SUPPLY — 64 items
BAND RUBBER #18 3X1/16 STRL (MISCELLANEOUS) ×4 IMPLANT
BUR CARBIDE MATCH 3.0 (BURR) ×2 IMPLANT
CARTRIDGE OIL MAESTRO DRILL (MISCELLANEOUS) ×1 IMPLANT
CNTNR URN SCR LID CUP LEK RST (MISCELLANEOUS) ×1 IMPLANT
CONT SPEC 4OZ CLIKSEAL STRL BL (MISCELLANEOUS) ×2 IMPLANT
CONT SPEC 4OZ STRL OR WHT (MISCELLANEOUS) ×1
COVER MAYO STAND STRL (DRAPES) ×2 IMPLANT
COVER WAND RF STERILE (DRAPES) IMPLANT
DECANTER SPIKE VIAL GLASS SM (MISCELLANEOUS) IMPLANT
DERMABOND ADVANCED (GAUZE/BANDAGES/DRESSINGS) ×1
DERMABOND ADVANCED .7 DNX12 (GAUZE/BANDAGES/DRESSINGS) ×1 IMPLANT
DIFFUSER DRILL AIR PNEUMATIC (MISCELLANEOUS) ×2 IMPLANT
DRAIN JACKSON RD 7FR 3/32 (WOUND CARE) IMPLANT
DRAPE C-ARM 42X72 X-RAY (DRAPES) ×2 IMPLANT
DRAPE LAPAROTOMY 100X72X124 (DRAPES) ×2 IMPLANT
DRAPE MICROSCOPE LEICA (MISCELLANEOUS) ×2 IMPLANT
DRAPE SURG 17X23 STRL (DRAPES) ×2 IMPLANT
DRSG OPSITE POSTOP 3X4 (GAUZE/BANDAGES/DRESSINGS) ×2 IMPLANT
DRSG OPSITE POSTOP 4X8 (GAUZE/BANDAGES/DRESSINGS) ×2 IMPLANT
DURAPREP 26ML APPLICATOR (WOUND CARE) ×2 IMPLANT
ELECT BLADE INSULATED 4IN (ELECTROSURGICAL) ×2
ELECT REM PT RETURN 9FT ADLT (ELECTROSURGICAL) ×2
ELECTRODE BLADE INSULATED 4IN (ELECTROSURGICAL) ×1 IMPLANT
ELECTRODE REM PT RTRN 9FT ADLT (ELECTROSURGICAL) ×1 IMPLANT
GAUZE 4X4 16PLY RFD (DISPOSABLE) IMPLANT
GAUZE SPONGE 4X4 12PLY STRL (GAUZE/BANDAGES/DRESSINGS) IMPLANT
GLOVE SRG 8 PF TXTR STRL LF DI (GLOVE) ×1 IMPLANT
GLOVE SURG LTX SZ8 (GLOVE) ×4 IMPLANT
GLOVE SURG POLYISO LF SZ6.5 (GLOVE) ×8 IMPLANT
GLOVE SURG UNDER POLY LF SZ6.5 (GLOVE) ×4 IMPLANT
GLOVE SURG UNDER POLY LF SZ8 (GLOVE) ×1
GOWN STRL REUS W/ TWL LRG LVL3 (GOWN DISPOSABLE) ×2 IMPLANT
GOWN STRL REUS W/ TWL XL LVL3 (GOWN DISPOSABLE) ×2 IMPLANT
GOWN STRL REUS W/TWL 2XL LVL3 (GOWN DISPOSABLE) IMPLANT
GOWN STRL REUS W/TWL LRG LVL3 (GOWN DISPOSABLE) ×2
GOWN STRL REUS W/TWL XL LVL3 (GOWN DISPOSABLE) ×2
HEMOSTAT POWDER KIT SURGIFOAM (HEMOSTASIS) ×2 IMPLANT
KIT BASIN OR (CUSTOM PROCEDURE TRAY) ×2 IMPLANT
KIT POSITION SURG JACKSON T1 (MISCELLANEOUS) ×2 IMPLANT
KIT TURNOVER KIT B (KITS) ×2 IMPLANT
MARKER SKIN DUAL TIP RULER LAB (MISCELLANEOUS) ×2 IMPLANT
NEEDLE HYPO 25X1 1.5 SAFETY (NEEDLE) ×2 IMPLANT
NEEDLE SPNL 18GX3.5 QUINCKE PK (NEEDLE) ×12 IMPLANT
NS IRRIG 1000ML POUR BTL (IV SOLUTION) ×2 IMPLANT
OIL CARTRIDGE MAESTRO DRILL (MISCELLANEOUS) ×2
PACK LAMINECTOMY NEURO (CUSTOM PROCEDURE TRAY) ×2 IMPLANT
PAD ARMBOARD 7.5X6 YLW CONV (MISCELLANEOUS) ×6 IMPLANT
PATTIES SURGICAL .5 X.5 (GAUZE/BANDAGES/DRESSINGS) IMPLANT
PATTIES SURGICAL .5 X1 (DISPOSABLE) IMPLANT
PATTIES SURGICAL 1X1 (DISPOSABLE) IMPLANT
SPONGE LAP 4X18 RFD (DISPOSABLE) IMPLANT
SPONGE SURGIFOAM ABS GEL 100 (HEMOSTASIS) ×2 IMPLANT
SPONGE SURGIFOAM ABS GEL 12-7 (HEMOSTASIS) IMPLANT
SUT VIC AB 0 CT1 27 (SUTURE) ×4
SUT VIC AB 0 CT1 27XBRD ANBCTR (SUTURE) ×4 IMPLANT
SUT VIC AB 2-0 CP2 18 (SUTURE) ×4 IMPLANT
SUT VIC AB 3-0 SH 8-18 (SUTURE) ×4 IMPLANT
SWAB COLLECTION DEVICE MRSA (MISCELLANEOUS) ×6 IMPLANT
SWAB CULTURE ESWAB REG 1ML (MISCELLANEOUS) ×6 IMPLANT
SYR 20ML ECCENTRIC (SYRINGE) ×2 IMPLANT
TOWEL GREEN STERILE (TOWEL DISPOSABLE) ×2 IMPLANT
TOWEL GREEN STERILE FF (TOWEL DISPOSABLE) ×2 IMPLANT
TUBE FEEDING ENTERAL 5FR 16IN (TUBING) ×2 IMPLANT
WATER STERILE IRR 1000ML POUR (IV SOLUTION) ×2 IMPLANT

## 2020-12-21 NOTE — Consult Note (Signed)
Chief Complaint: Patient was seen in consultation today for left psoas abscess aspiration/drain Chief Complaint  Patient presents with   Back Pain   at the request of Dr Terrilee Files  Referring Physician(s): Dr Kerry Hough  Supervising Physician: Marliss Coots  Patient Status: [facility] - In-pt  History of Present Illness: Louis [name] is a 55 y.o. male   CAD; HTN; DM Presented to [facility] 12/12/20 with back pain x 2-3 days MRI yesterday: IMPRESSION: Marked worsening of epidural disease as above, with epidural abscess extending up to the upper edge of the scan, at T11. Extension above that is likely but not characterized. Maximal thickness of the ventral epidural abscess at L1 is 8 mm. Increasing epidural abscess also behind L3 and L4. Worsening paraspinous inflammatory changes left worse than right with frank psoas abscesses now present on the left. The largest single abscess shows a diameter of 1.7 cm. 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 Changes at the disc levels themselves are fairly minor since the previous study, without aggressive endplate destruction.   Tx to [facility] for ID and NS consults +MRSA bacteremia; No endocarditis  Continued pain and increase wbc Repeat [facility] 6/22--- NGTD For OR today for epidural abscess  Request made for IR to aspirate/drain  left psoas abscess Dr Elby Showers has reviewed imaging He has approved likely aspiration only--- none large enough for drain This will be done AFTER OR today    Past Medical History:  Diagnosis Date   CAD (coronary artery disease)    DM (diabetes mellitus) (HCC)    Hyperlipidemia    Hypertension     History reviewed. No pertinent surgical history.  Allergies: Patient has no known allergies.  Medications: Prior to Admission medications   Medication Sig Start Date End Date Taking? Authorizing Provider  acetaminophen (TYLENOL) 500 MG tablet Take 2,000 mg by mouth daily as  needed (pain).   Yes [provider]  ALPRAZolam (XANAX) 0.25 MG tablet Take 0.25 mg by mouth at bedtime as needed for anxiety or sleep.   Yes [provider]  amLODipine (NORVASC) 2.5 MG tablet Take 2.5 mg by mouth every morning.   Yes [provider]  aspirin EC 81 MG tablet Take 81 mg by mouth every morning. Swallow whole.   Yes [provider]  atorvastatin (LIPITOR) 80 MG tablet Take 80 mg by mouth at bedtime.   Yes [provider]  dapagliflozin propanediol (FARXIGA) 10 MG TABS tablet Take 10 mg by mouth every morning.   Yes [provider]  diclofenac Sodium (VOLTAREN) 1 % GEL Apply 1 application topically 4 (four) times daily as needed (pain).   Yes [provider]  furosemide (LASIX) 20 MG tablet Take 20 mg by mouth every morning.   Yes [provider]  gabapentin (NEURONTIN) 100 MG capsule Take 200 mg by mouth 3 (three) times daily as needed (pain).   Yes [provider]  glipiZIDE (GLUCOTROL) 5 MG tablet Take 5 mg by mouth 2 (two) times daily.   Yes [provider]  HYDROcodone-acetaminophen (NORCO) 10-325 MG tablet Take 0.5-1 tablets by mouth every 8 (eight) hours as needed (pain).   Yes [provider]  icosapent Ethyl (VASCEPA) 1 g capsule Take 2 g by mouth 2 (two) times daily.   Yes [provider]  lidocaine (LIDODERM) 5 % Place 1 patch onto the skin daily as needed (pain). Remove & Discard patch within 12 hours or as directed by  MD   Yes [provider]  losartan (COZAAR) 25 MG tablet Take 25 mg by mouth every morning.   Yes [provider]  metFORMIN (GLUCOPHAGE) 500 MG tablet Take 500-1,000 mg by mouth See admin instructions. Take 2 tablets (1000 mg) by mouth every morning and 1 tablet (500 mg) at night   Yes [provider]  metoprolol succinate (TOPROL-XL) 25 MG 24 hr tablet Take 25 mg by mouth at bedtime.   Yes [provider]   omeprazole (PRILOSEC) 20 MG capsule Take 20 mg by mouth every morning.   Yes [provider]  rosuvastatin (CRESTOR) 20 MG tablet Take 20 mg by mouth every Sunday. night   Yes [provider]  tiZANidine (ZANAFLEX) 4 MG tablet Take 4 mg by mouth 3 (three) times daily as needed for muscle spasms.   Yes [provider]  venlafaxine XR (EFFEXOR-XR) 75 MG 24 hr capsule Take 75 mg by mouth every morning.   Yes [provider]     History reviewed. No pertinent family history.  Social History   Socioeconomic History   Marital status: Married    Spouse name: [name]   Number of children: 1   Years of education: Not on file   Highest education level: Not on file  Occupational History   Occupation: Postal delivery  Tobacco Use   Smoking status: Every Day    Packs/day: 2.00    Pack years: 0.00    Types: Cigarettes    Start date: 06/28/1995   Smokeless tobacco: Never  Vaping Use   Vaping Use: Never used  Substance and Sexual Activity   Alcohol use: Not Currently   Drug use: Never   Sexual activity: Not Currently  Other Topics Concern   Not on file  Social History Narrative   Not on file   Social Determinants of Health   Financial Resource Strain: Not on file  Food Insecurity: Not on file  Transportation Needs: Not on file  Physical Activity: Not on file  Stress: Not on file  Social Connections: Not on file     Review of Systems: A 12 point ROS discussed and pertinent positives are indicated in the HPI above.  All other systems are negative.  Review of Systems  Constitutional:  Positive for activity change.  Respiratory:  Negative for cough and shortness of breath.   Cardiovascular:  Negative for chest pain.  Gastrointestinal:  Negative for abdominal pain.  Musculoskeletal:  Positive for back pain and gait problem.  Neurological:  Positive for weakness.  Psychiatric/Behavioral:  Positive for decreased concentration. Negative for  behavioral problems.    Vital Signs: BP (!) 169/89   Pulse (!) 106   Temp 97.6 F (36.4 C) (Oral)   Resp 18   Ht 5\' 11"  (1.803 m)   Wt 207 lb (93.9 kg)   SpO2 97%   BMI 28.87 kg/m   Physical Exam HENT:     Mouth/Throat:     Mouth: Mucous membranes are moist.  Cardiovascular:     Rate and Rhythm: Normal rate and regular rhythm.  Pulmonary:     Breath sounds: Normal breath sounds.  Abdominal:     Palpations: Abdomen is soft.  Musculoskeletal:     Comments: Mid to low back pain  Skin:    General: Skin is warm.  Neurological:     Mental Status: He is alert and oriented to person, place, and time.  Psychiatric:     Comments: Groggy Pt  was seen in pre op area of SSC Awaiting surgery for epidural abscess    Imaging: DG Chest 1 View  Result Date: 12/12/2020 CLINICAL DATA:  Sepsis.  Patient reports back and leg pain. EXAM: CHEST  1 VIEW COMPARISON:  Lung bases from abdominal CT yesterday. FINDINGS: Post median sternotomy and CABG. Normal heart size for technique. No focal airspace disease. No pleural effusion, pneumothorax, or pulmonary edema. No acute osseous abnormalities are seen. IMPRESSION: No acute abnormality. Electronically Signed   By: Narda Rutherford M.D.   On: 12/12/2020 23:03   CT HEAD WO CONTRAST  Result Date: 12/16/2020 CLINICAL DATA:  Altered mental status EXAM: CT HEAD WITHOUT CONTRAST TECHNIQUE: Contiguous axial images were obtained from the base of the skull through the vertex without intravenous contrast. COMPARISON:  None. FINDINGS: Brain: No evidence of acute infarction, hemorrhage, hydrocephalus, extra-axial collection or mass lesion/mass effect. Vascular: No hyperdense vessel or unexpected calcification. Skull: Normal. Negative for fracture or focal lesion. Sinuses/Orbits: Paranasal sinuses demonstrate mild mucosal thickening. Orbits and their contents are within normal limits. Other: None. IMPRESSION: Mild mucosal thickening in the paranasal sinuses. No  acute abnormality noted. Electronically Signed   By: Alcide Clever M.D.   On: 12/16/2020 20:43   MR LUMBAR SPINE WO CONTRAST  Result Date: 12/12/2020 CLINICAL DATA:  Severe left-sided low back pain. EXAM: MRI LUMBAR SPINE WITHOUT CONTRAST TECHNIQUE: Multiplanar, multisequence MR imaging of the lumbar spine was performed. No intravenous contrast was administered. COMPARISON:  CT urogram 12/11/2020 FINDINGS: Segmentation: 5 non rib-bearing lumbar type vertebral bodies are present. The lowest fully formed vertebral body is L5. Alignment: No significant listhesis is present. There is some straightening of the normal cervical lordosis. Vertebrae: Edematous endplate changes are present along the left side of the endplates at L3-4 and L4-5. Diffuse edematous changes are present at L5-S1. Marrow signal and vertebral body heights are otherwise normal. Conus medullaris and cauda equina: Conus extends to the L1-2 level. Conus and cauda equina appear normal. Paraspinal and other soft tissues: Edematous changes are present in the left paraspinous soft tissues, centered at L3-4 L4-5. There are changes into the left psoas muscle without a discrete abscess. Disc levels: L1-2: Negative. L2-3: Broad-based disc protrusion is asymmetric to the right. No significant stenosis. L3-4: Abnormal fluid signal is present in the disc space. Broad-based disc protrusion is present. Moderate left and mild right foraminal narrowing is present. L4-5: A broad-based disc protrusion is present. Mild facet hypertrophy is noted bilaterally. Crowding of nerve roots is present. Abnormal fluid signal is present in the disc space, worse on the left. Moderate foraminal narrowing is present bilaterally. L5-S1: Broad-based disc protrusion is asymmetric to the right. Moderate foraminal narrowing is worse on the right. Fluid signal is present in the disc space. IMPRESSION: 1. Abnormal fluid signal in the disc spaces at L3-4, L4-5, and L5-S1 concerning for  early disc osteomyelitis. 2. Edematous changes in the left paraspinous soft tissues and left psoas muscle compatible with acute myositis. No discrete abscess is present. 3. Moderate left and mild right foraminal stenosis at L3-4. 4. Moderate foraminal narrowing bilaterally at L4-5 and L5-S1. These results were called by telephone at the time of interpretation on 12/12/2020 at 8:34 pm to provider [facility] RAY , who verbally acknowledged these results. Electronically Signed   By: Marin Roberts M.D.   On: 12/12/2020 20:36   MR CERVICAL SPINE W WO CONTRAST  Result Date: 12/21/2020 CLINICAL DATA:  55 year old male with thoracic and lumbar discitis  osteomyelitis, epidural abscess. EXAM: MRI CERVICAL SPINE WITHOUT AND WITH CONTRAST TECHNIQUE: Multiplanar and multiecho pulse sequences of the cervical spine, to include the craniocervical junction and cervicothoracic junction, were obtained without and with intravenous contrast. CONTRAST:  10mL GADAVIST GADOBUTROL 1 MMOL/ML IV SOLN in conjunction with contrast enhanced imaging of the thoracic spine reported separately. COMPARISON:  Thoracic MRI from today reported separately. FINDINGS: Alignment: Straightening of cervical lordosis. No spondylolisthesis. Vertebrae: No marrow edema or evidence of acute osseous abnormality. Bone marrow signal is within normal limits at the skull base and in the cervical spine. Cord: No cervical cord signal abnormality despite degenerative mass effect on the spinal cord detailed below. But there does appear to be faint abnormal posterior dural thickening and enhancement and leptomeningeal enhancement along the upper thoracic cord as seen on series 35, image 9. Capacious upper thoracic spinal canal. Posterior Fossa, vertebral arteries, paraspinal tissues: Cervicomedullary junction is within normal limits. Negative visible posterior fossa. Motion artifact on the axial cervical spine imaging. Postoperative or posttraumatic changes to the  midline soft tissues at the level of the upper cervical spine. Elsewhere grossly normal visible neck soft tissues. Negative lung apices. Disc levels: Suboptimal due to motion artifact. C2-C3:  Negative. C3-C4: Central disc protrusion best seen on series 20, image 11. Mild spinal stenosis and mild cord mass effect. C4-C5: More broad-based central or left paracentral disc with mild spinal stenosis and mild cord mass effect (series 19, image 17). Moderate appearing bilateral C5 foraminal stenosis. C5-C6: Broad-based posterior disc with mild spinal stenosis and cord mass effect. Moderate left and up to severe right C6 foraminal stenosis. C6-C7: Disc bulging but no spinal stenosis at this level. Mild to moderate bilateral C7 foraminal stenosis. C7-T1:  Mild facet hypertrophy but otherwise negative. IMPRESSION: 1. Partially degraded by motion. No acute or inflammatory process identified in the cervical spine. 2. Faint abnormal dural thickening and leptomeningeal enhancement along the upper thoracic spinal cord, in conjunction with the abnormal Thoracic Spine MRI reported separately today. 3. Cervical spine degeneration with mild spinal stenosis secondary to cervical disc disease C3-C4 through C5-C6. Mild cervical cord mass effect but identified no. Cord signal abnormality Electronically Signed   By: Odessa Fleming M.D.   On: 12/21/2020 08:20   MR THORACIC SPINE W WO CONTRAST  Result Date: 12/21/2020 CLINICAL DATA:  55 year old male with lumbar discitis osteomyelitis, worsening lumbar epidural abscess continuing into the lower thoracic spine on MRI yesterday. EXAM: MRI THORACIC WITHOUT AND WITH CONTRAST TECHNIQUE: Multiplanar and multiecho pulse sequences of the thoracic spine were obtained without and with intravenous contrast. CONTRAST:  10mL GADAVIST GADOBUTROL 1 MMOL/ML IV SOLN COMPARISON:  Lumbar MRI 12/20/2020, 619 22. FINDINGS: Limited cervical spine imaging:  Grossly negative. Thoracic spine segmentation: Normal,  concordant with the lumbar numbering. Alignment:  Preserved thoracic kyphosis. Vertebrae: Abnormal marrow edema and patchy enhancement throughout the T7 and T8 vertebral bodies with abnormal intervening disc space, see details below. No other thoracic marrow edema identified. Small benign posterior T4 vertebral body hemangioma suspected. Cord: Upper thoracic spinal cord signal and morphology are normal to the T5 level. See abnormal spinal canal findings from T6 inferiorly detailed below. No associated cord edema is identified. But there is abundant abnormal intradural enhancement, and also some posterior dural thickening described below. Paraspinal and other soft tissues: Circumferential paraspinal phlegmon at T7-T8, see details below. No paraspinal muscle abscess is identified. Negative visible lungs and upper abdominal viscera. Disc levels: C7-T1 through T4-T5 levels are within normal limits. T5-T6:  Degenerative moderate to severe right side facet hypertrophy. Degenerative moderate to severe right T5 foraminal stenosis. T6-T7: Disc space remains within normal limits at this level but there is abnormal circumferential dural thickening and enhancement which begins posteriorly at the T5-T6 disc level and continues inferiorly. No stenosis. T7-T8: Abnormal disc space loss with increased T2/STIR hyperintensity and paraspinal phlegmon in keeping with discitis osteomyelitis. Bilateral epidural abscesses including fluid collections emanating from the disc spaces ventrally (series 30, images 9 and 13). Ventral slightly greater than dorsal epidural abscess measuring up to 5 mm in thickness at this level with mild mass effect on the spinal cord. Associated abnormal dural and leptomeningeal enhancement at this level. T8-T9: Circumferential epidural abscess and abnormal dural and leptomeningeal enhancement continuing inferiorly from the level above with mild mass effect on the spinal cord (series 29, image 25). There is mild  increased signal in this disc space although no convincing T9 endplate inflammation at this time. T9-T10: Continued circumferential epidural abscess, more pronounced ventrally (series 29, image 29). Decreasing dural and leptomeningeal enhancement compared to the levels above. T10-T11: Continued mostly ventral epidural abscess at this level. Mild-to-moderate leptomeningeal and dural enhancement. T11-T12: Continued mostly ventral epidural abscess at this level (series 29, image 35). Mild leptomeningeal and dural enhancement. T12-L1: Mostly ventral epidural abscess. Mild leptomeningeal and dural enhancement (series 33, image 9). IMPRESSION: 1. T7-T8 Discitis Osteomyelitis with multiloculated, circumferential Epidural Abscess associated with abundant leptomeningeal and dural thickening and enhancement. Subsequent compression and mildly tethering the spinal cord at this level. No cord edema. Paraspinal phlegmon but no associated extra-spinal abscess. 2. The abnormal dural thickening continues cephalad to the T5 level. While ventral > dorsal Epidural Abscess along with leptomeningeal thickening and enhancement continues caudally through the thoracolumbar junction. Associated mass effect on the spinal cord from T7 (moderate) through T12 (mild). 3. Possible early discitis also at T8-T9. Electronically Signed   By: Odessa Fleming M.D.   On: 12/21/2020 06:20   MR Lumbar Spine W Wo Contrast  Result Date: 12/20/2020 CLINICAL DATA:  Follow-up spinal infection. EXAM: MRI LUMBAR SPINE WITHOUT AND WITH CONTRAST TECHNIQUE: Multiplanar and multiecho pulse sequences of the lumbar spine were obtained without and with intravenous contrast. CONTRAST:  51mL GADAVIST GADOBUTROL 1 MMOL/ML IV SOLN COMPARISON:  12/12/2020 FINDINGS: Segmentation: 5 lumbar type vertebral bodies as numbered previously. Alignment:  No malalignment. Vertebrae: Redemonstration of discitis with early endplate osteomyelitis at the L3-4, L4-5 and L5-S1 levels. No evidence  of aggressive endplate destruction since the previous study. See below. Conus medullaris and cauda equina: Conus tip is at the L1-2 level. There is been development of a large ventral epidural abscess present at the upper edge of the scan at the T11 level and extending down to inferior L2. Maximal thickness behind L1 is 8 mm. This displaces thecal sac posteriorly. Additional epidural abscess is present behind L3 and L4, also with mass-effect upon the thecal sac. Paraspinal and other soft tissues: Worsening of paraspinous inflammatory changes affecting both psoas muscles left worse than right. 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 Disc levels: No disc space pathology seen from T11-12 through L1-2. L2-3: Mild bulging of the disc. L3-4 and L4-5: Discitis osteomyelitis as discussed above. Annular bulging. L5-S1: Findings at this level or most consistent with chronic degenerative spondylosis, probably with an element of early disc space infection at this level. Endplate osteophytes and bulging of the disc result in foraminal narrowing right more than left with some potential to affect the exiting  right L5 nerve. IMPRESSION: Marked worsening of epidural disease as above, with epidural abscess extending up to the upper edge of the scan, at T11. Extension above that is likely but not characterized. Maximal thickness of the ventral epidural abscess at L1 is 8 mm. Increasing epidural abscess also behind L3 and L4. Worsening paraspinous inflammatory changes left worse than right with frank psoas abscesses now present on the left. The largest single abscess shows a diameter of 1.7 cm. 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 Changes at the disc levels themselves are fairly minor since the previous study, without aggressive endplate destruction. Electronically Signed   By: Paulina Fusi M.D.   On: 12/20/2020 14:40   MR Abdomen W or Wo Contrast  Result Date:  12/12/2020 CLINICAL DATA:  Evaluate left kidney cyst EXAM: MRI ABDOMEN WITHOUT AND WITH CONTRAST TECHNIQUE: Multiplanar multisequence MR imaging of the abdomen was performed both before and after the administration of intravenous contrast. CONTRAST:  9.99mL GADAVIST GADOBUTROL 1 MMOL/ML IV SOLN COMPARISON:  CT AP 12/11/2020 FINDINGS: Lower chest: No acute findings. Hepatobiliary: No mass or other parenchymal abnormality identified. Pancreas: No mass, inflammatory changes, or other parenchymal abnormality identified. Spleen:  Within normal limits in size and appearance. Adrenals/Urinary Tract: Normal adrenal glands. Bilateral perinephric fat stranding is identified. No hydronephrosis identified bilaterally. Cyst arising off the inferior pole of the left kidney measures 4.2 by 3.3 cm, image 35/7. There is no internal septation or mural nodule identified. No enhancement identified. Stomach/Bowel: Visualized portions within the abdomen are unremarkable. Vascular/Lymphatic: No pathologically enlarged lymph nodes identified. No abdominal aortic aneurysm demonstrated. Other:  None. Musculoskeletal: There are signs of discitis/osteomyelitis involving the L3-4, L4-5 and L5-S1 levels. Surrounding edema and enhancement extends into the left psoas muscle. No discrete fluid collection identified to suggest abscess. IMPRESSION: 1. 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 No suspicious kidney lesions identified at this time. 2. Findings compatible with discitis/osteomyelitis involving the L3-4, L4-5 and L5-S1 levels. Surrounding edema and enhancement extends into the left psoas muscle. No discrete fluid collection identified to suggest abscess. See lumbar spine report from today. Electronically Signed   By: Signa Kell M.D.   On: 12/12/2020 20:49   CT ABDOMEN PELVIS W CONTRAST  Result Date: 12/16/2020 CLINICAL DATA:  Abdominal distension Nausea/vomiting Bowel obstruction suspected Abdominal  pain, acute, nonlocalized EXAM: CT ABDOMEN AND PELVIS WITH CONTRAST TECHNIQUE: Multidetector CT imaging of the abdomen and pelvis was performed using the standard protocol following bolus administration of intravenous contrast. CONTRAST:  OMNIPAQUE IOHEXOL 300 MG/ML  SOLN COMPARISON:  Noncontrast CT 5 days ago 12/11/2020. Abdominal MRI 12/12/2020 FINDINGS: Lower chest: Trace right pleural effusion. No focal airspace disease. Hepatobiliary: No focal liver abnormality is seen. No gallstones, gallbladder wall thickening, or biliary dilatation. Pancreas: No ductal dilatation or inflammation. Spleen: Normal in size without focal abnormality. Adrenals/Urinary Tract: No adrenal nodule. No hydronephrosis. Similar bilateral perinephric edema. No renal calculi. Cyst arising from the lower left kidney with peripheral calcifications measures 4.7 cm. 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 No new renal abnormality. Unremarkable urinary bladder. Stomach/Bowel: Minimal fluid in the distal esophagus. Stomach physiologically distended without abnormal gastric distension. There are occasional fluid-filled loops of small bowel without abnormal distension, wall thickening or inflammation. No small bowel obstruction. Normal appendix. Mild colonic distension with air and stool. Mild distal colonic diverticulosis. No diverticulitis, colonic wall thickening, or acute colonic inflammation. There is  no colonic obstruction. Vascular/Lymphatic: Moderate aortic atherosclerosis. No aortic aneurysm. Patent portal vein. There are small retroperitoneal lymph nodes not enlarged by size criteria. No inguinal or pelvic adenopathy. Reproductive: Prostatic calcifications. Other: No abdominopelvic ascites. No free air or focal fluid collection. Small fat containing umbilical hernia. There is mild body wall edema. Musculoskeletal: Findings of discitis osteomyelitis at L3-L4, L4-L5, and L5-S1 were better appreciated on prior  lumbar MRI. There is endplate sclerosis at L4-L5 and L5-S1 that appears similar, slight increase endplate irregularity at L3-L4 disc space from prior. Left psoas inflammatory change with enlargement and low density. There is a small 1.4 x 2.2 cm collection within the muscle, series 3, image 52 and series 6, image 59, with small foci of internal air and faint peripheral enhancement. Anterior paraspinal soft tissue thickening at L3-L4 level. IMPRESSION: 1. 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 Slight increase in endplate irregularity at the L3-L4 level. Left psoas myositis. There is a small ill-defined 1.4 x 2.2 cm collection within the left psoas muscle with small foci of internal air and faint peripheral enhancement, suspicious for phlegmon/developing abscess. 2. No bowel obstruction or other acute abnormality in the abdomen/pelvis. 3. Mild distal colonic diverticulosis without diverticulitis. Mild colonic distension with air and stool but no inflammatory change or obstruction. 4. Trace right pleural effusion. 5. 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 Aortic Atherosclerosis (ICD10-I70.0). Electronically Signed   By: Narda Rutherford M.D.   On: 12/16/2020 20:48   ECHOCARDIOGRAM COMPLETE  Result Date: 12/15/2020    ECHOCARDIOGRAM REPORT   Patient Name:   Louis [name]  Date of Exam: 12/15/2020 Medical Rec #:  [id number]  Height:       71.0 in Accession #:    [id number] Weight:       207.0 lb Date of Birth:  Feb 24, 1966   BSA:          2.140 m Patient Age:    55 years   BP:           109/76 mmHg Patient Gender: M          HR:           104 bpm. Exam Location:  Inpatient Procedure: 2D Echo, Cardiac Doppler and Color Doppler Indications:    Bacteremia  History:        Patient has no prior history of Echocardiogram examinations.                 CAD; Risk Factors:Dyslipidemia and Diabetes.  Sonographer:    Shirlean Kelly Referring Phys: [id number] Azucena Fallen IMPRESSIONS  1. Left ventricular ejection fraction, by estimation, is 50 to 55%. The left ventricle has low normal function. The left ventricle has no regional wall motion abnormalities. There is moderate concentric left ventricular hypertrophy. Left ventricular diastolic parameters are consistent with Grade I diastolic dysfunction (impaired relaxation).  2. Right ventricular systolic function is normal. The right ventricular size is normal.  3. Left atrial size was mildly dilated.  4. The mitral valve is normal in structure. No evidence of mitral valve regurgitation. No evidence of mitral stenosis.  5. The aortic valve is tricuspid. Aortic valve regurgitation is not visualized. No aortic stenosis is present.  6. The inferior vena cava is normal in size with greater than 50% respiratory variability, suggesting right atrial pressure of 3 mmHg. FINDINGS  Left Ventricle: Left ventricular ejection fraction, by  estimation, is 50 to 55%. The left ventricle has low normal function. The left ventricle has no regional wall motion abnormalities. The left ventricular internal cavity size was normal in size. There is moderate concentric left ventricular hypertrophy. Left ventricular diastolic parameters are consistent with Grade I diastolic dysfunction (impaired relaxation). Indeterminate filling pressures. Right Ventricle: The right ventricular size is normal. No increase in right ventricular wall thickness. Right ventricular systolic function is normal. Left Atrium: Left atrial size was mildly dilated. Right Atrium: Right atrial size was normal in size. Pericardium: There is no evidence of pericardial effusion. Mitral Valve: The mitral valve is normal in structure. No evidence of mitral valve regurgitation. No evidence of mitral valve stenosis. Tricuspid Valve: The tricuspid valve is normal in structure. Tricuspid valve regurgitation is not demonstrated. No evidence of  tricuspid stenosis. Aortic Valve: The aortic valve is tricuspid. Aortic valve regurgitation is not visualized. No aortic stenosis is present. Aortic valve mean gradient measures 5.0 mmHg. Aortic valve peak gradient measures 8.9 mmHg. Aortic valve area, by VTI measures 3.50 cm. Pulmonic Valve: The pulmonic valve was normal in structure. Pulmonic valve regurgitation is not visualized. No evidence of pulmonic stenosis. Aorta: The aortic root is normal in size and structure. Venous: The inferior vena cava is normal in size with greater than 50% respiratory variability, suggesting right atrial pressure of 3 mmHg. IAS/Shunts: No atrial level shunt detected by color flow Doppler.  LEFT VENTRICLE PLAX 2D LVIDd:         4.10 cm  Diastology LVIDs:         3.00 cm  LV e' medial:    8.27 cm/s LV PW:         1.30 cm  LV E/e' medial:  10.9 LV IVS:        1.60 cm  LV e' lateral:   13.20 cm/s LVOT diam:     2.40 cm  LV E/e' lateral: 6.8 LV SV:         84 LV SV Index:   39 LVOT Area:     4.52 cm  RIGHT VENTRICLE             IVC RV S prime:     12.00 cm/s  IVC diam: 1.25 cm TAPSE (M-mode): 1.4 cm LEFT ATRIUM             Index       RIGHT ATRIUM           Index LA diam:        3.10 cm 1.45 cm/m  RA Area:     14.80 cm LA Vol (A2C):   47.8 ml 22.34 ml/m RA Volume:   39.60 ml  18.51 ml/m LA Vol (A4C):   69.8 ml 32.62 ml/m LA Biplane Vol: 58.2 ml 27.20 ml/m  AORTIC VALVE AV Area (Vmax):    4.28 cm AV Area (Vmean):   3.91 cm AV Area (VTI):     3.50 cm AV Vmax:           149.00 cm/s AV Vmean:          104.000 cm/s AV VTI:            0.239 m AV Peak Grad:      8.9 mmHg AV Mean Grad:      5.0 mmHg LVOT Vmax:         141.00 cm/s LVOT Vmean:        90.000 cm/s LVOT VTI:  0.185 m LVOT/AV VTI ratio: 0.77  AORTA Ao Root diam: 3.20 cm Ao Asc diam:  3.40 cm MITRAL VALVE MV Area (PHT): 4.60 cm    SHUNTS MV Decel Time: 165 msec    Systemic VTI:  0.18 m MV E velocity: 90.00 cm/s  Systemic Diam: 2.40 cm MV A velocity: 86.60 cm/s MV  E/A ratio:  1.04 Chilton Siiffany Campbell MD Electronically signed by Chilton Siiffany Rutherford MD Signature Date/Time: 12/15/2020/2:47:29 PM    Final     Labs:  CBC: Recent Labs    12/17/20 0304 12/18/20 0116 12/19/20 0547 12/20/20 0028  WBC 13.6* 15.7* 17.0* 23.2*  HGB 15.1 14.0 14.5 14.9  HCT 43.7 40.6 42.9 42.5  PLT 251 293 376 408*    COAGS: Recent Labs    12/12/20 0025  INR 1.3*  APTT 28    BMP: Recent Labs    12/17/20 0304 12/18/20 0116 12/19/20 0547 12/20/20 0028  NA 135 133* 131* 134*  K 4.1 3.7 3.6 3.3*  CL 96* 100 99 99  CO2 25 19* 20* 26  GLUCOSE 203* 168* 201* 175*  BUN 22* 16 14 10   CALCIUM 8.8* 8.3* 8.2* 8.2*  CREATININE 0.84 0.85 0.70  0.70 0.64  GFRNONAA >60 >60 >60  >60 >60    LIVER FUNCTION TESTS: Recent Labs    12/13/20 0046 12/17/20 0304  BILITOT 1.9* 0.9  AST 44* 78*  ALT 40 38  ALKPHOS 65 97  PROT 5.9* 5.7*  ALBUMIN 2.9* 2.4*    TUMOR MARKERS: No results for input(s): AFPTM, CEA, CA199, CHROMGRNA in the last 8760 hours.  Assessment and Plan:  Back pain MRSA bacteremia Left psoas abscess Scheduled for psoas abscess aspiration in IR when can  [facility] Will try to contact family for consent  Thank you for this interesting consult.  I greatly enjoyed meeting Louis [name] and look forward to participating in their care.  A copy of this report was sent to the requesting provider on this date.  Electronically Signed: Robet LeuPamela A Cherryl Babin, PA-C 12/21/2020, 10:45 AM   I spent a total of 40 Minutes    in face to face in clinical consultation, greater than 50% of which was counseling/coordinating care for left psoas abscess aspiration

## 2020-12-21 NOTE — Progress Notes (Signed)
   Providing Compassionate, Quality Care - Together  NEUROSURGERY PROGRESS NOTE   S: Patient seen and examined in preop.  Complains of severe back pain in the thoracic, lumbar region and also complains of bilateral hip and thigh pain.  Denies any numbness, tingling or weakness in his legs.  Denies any bowel or bladder changes, denies any groin numbness.  O: EXAM:  BP (!) 169/89   Pulse (!) 106   Temp 97.6 F (36.4 C) (Oral)   Resp 18   Ht 5\' 11"  (1.803 m)   Wt 93.9 kg   SpO2 97%   BMI 28.87 kg/m   Awake, alert, oriented x3 Moderate distress due to pain PERRLA EOMI Speech fluent, appropriate  CNs grossly intact  5/5 BUE/BLE  Sensory intact to light touch  ASSESSMENT:  55 y.o. 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  I discussed with the patient all risks, benefits and expected outcomes, we discussed the imaging findings and he agreed to proceed.  He understands that ultimately he will need long-term IV antibiotics to treat his osteodiscitis however due to his stenosis we recommended surgical intervention. -I extensively reviewed his imaging and discussed with him the findings. To recap he presented to the ED with left-sided back pain for a few days, has a history of uncontrolled diabetes, coronary artery disease and MRI found L3-S1 osteomyelitis discitis with epidural abscess.  He was treated conservatively with antibiotics she has a history of MRSA bacteremia.  His pain continued therefore MRI was repeated on 627 which revealed progression of his epidural abscess, and now a psoas abscess. -I will discuss the findings with his wife and update her on the surgical plan.  Dr. [id number] and I have discussed this extensively we both agree with the surgical plan which he will be assisting me in the  surgery.    Thank you for allowing me to participate in this patient's care.  Please do not hesitate to call with questions or concerns.   Yetta Barre, DO Neurosurgeon [facility] Cell: [phone]

## 2020-12-21 NOTE — Anesthesia Procedure Notes (Signed)
Procedure Name: Intubation Date/Time: 12/21/2020 11:51 AM Performed by: Colin Benton, CRNA Pre-anesthesia Checklist: Patient identified, Emergency Drugs available, Suction available and Patient being monitored Patient Re-evaluated:Patient Re-evaluated prior to induction Oxygen Delivery Method: Circle system utilized Preoxygenation: Pre-oxygenation with 100% oxygen Induction Type: IV induction Ventilation: Mask ventilation without difficulty and Oral airway inserted - appropriate to patient size Laryngoscope Size: Mac and 4 Grade View: Grade I Tube type: Oral Tube size: 7.5 mm Number of attempts: 1 Airway Equipment and Method: Stylet Placement Confirmation: ETT inserted through vocal cords under direct vision, positive ETCO2 and breath sounds checked- equal and bilateral Secured at: 22 cm Tube secured with: Tape Dental Injury: Teeth and Oropharynx as per pre-operative assessment  Comments: Performed by Heide Scales, SRNA under direct supervison by Dr. Gloris Manchester and Alden Benjamin, CRNA

## 2020-12-21 NOTE — Plan of Care (Signed)
  Problem: Coping: Goal: Level of anxiety will decrease Outcome: Progressing   Problem: Pain Managment: Goal: General experience of comfort will improve Outcome: Not Progressing   

## 2020-12-21 NOTE — Plan of Care (Signed)
  Problem: Elimination: Goal: Will not experience complications related to bowel motility Outcome: Progressing   Problem: Elimination: Goal: Will not experience complications related to urinary retention Outcome: Progressing   

## 2020-12-21 NOTE — Progress Notes (Signed)
PROGRESS NOTE  Jodeci Roarty HBZ:[id number] DOB: May 20, 1966 DOA: 12/12/2020 PCP: Pcp, No  HPI/Recap of past 73 hours: 55 year old male with past medical history of CAD, hypertension, uncontrolled diabetes mellitus who presented to the [facility] emergency room with left-sided back pain x2 days.  CT scan unremarkable, but MRI noted osteomyelitis/discitis at L3-S1 and patient was transferred to [facility] to the hospitalist service on 6/19 given lack of infectious disease and neurosurgical coverage in [place].  Patient also noted to have MRSA bacteremia and a left psoas myositis but no discrete fluid collection.  Started on vancomycin. EDP initially discussed case with neurosurgery who recommended no surgical indications at this time.  Infectious disease evaluated patient recommending TTE to rule out endocarditis which was found to be negative.  Patient was started on daptomycin with plans for an 8-week course.  Due to persistent pain and worsening leukocytosis, MRI was repeated on 6/27 that showed evidence of epidural abscess and psoas abscess.  Neurosurgery formally consulted as patient may need operative management.    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 Sepsis was present on admission, since has stabilized.  Repeat blood cultures drawn on 6/22 and have not shown any growth.  TTE done without signs of vegetations. ID following. Currently on daptomycin.    Epidural abscess/psoas abscess: Since patient had continued low back pain, rising WBC count despite IV antibiotics, MRI of the spine was repeated on 6/27.  This showed extensive epidural abscess as well as psoas abscess.   Neurosurgery consulted and patient underwent operative management on 6/28.  Interventional radiology consulted for psoas abscess and will likely drain this on 6/29.  Continue IV antibiotics    CAD (coronary artery disease): Stable   Dyslipidemia   HTN (hypertension): Blood pressure elevated, likely related to pain    Uncontrolled diabetes mellitus ([place]): A1c greater than 9.  Advised the patient is better controlled.  Blood sugars have been stable on sliding scale insulin    Overweight (BMI 25.0-29.9): Meets criteria BMI greater than 25    Constipated: CT abdomen negative for SBO.  Continue on lactulose    Back pain: Currently on scheduled hydrocodone with prn dilaudid for breakthrough.  Has also been started on OxyContin for long-acting pain med.  I am hopeful that postoperatively, his overall pain should improved now that infection has been decompressed  Acute encephalopathy: suspect this is related to uncontrolled pain and opiates. ABG, ammonia, UA, CT head unremarkable. Overall mental status has improved  Hyponatremia. Improved with saline infusion   Code Status: Full code  Family Communication: discussed with wife at the bedside  Disposition Plan: Pending further management of epidural abscess   Consultants: Neurosurgery Infectious disease  Procedures: 2D echo: no evidence of vegetations Open T7, T8 left hemilaminectomy, medial facetectomy; open T12-L1 left hemilaminectomy, medial facetectomy; open L3-4 hemilaminectomy, medial facetectomy for evacuation of multi loculated epidural abscess  Antimicrobials: IV vancomycin 6/19-6/22 Daptomycin 6/22>  DVT prophylaxis: Lovenox  Level of care: Med-Surg   Subjective Patient is seen in his room postoperatively.  He recently received pain medications and appears comfortable at this time.  Reports continued back pain.  Objective: Vitals:   12/21/20 1600 12/21/20 1632  BP:  (!) 145/91  Pulse: (!) 113 Marland Kitchen)  108  Resp: 14    Temp: 97.9 F (36.6 C) 97.6 F (36.4 C)  SpO2: 93%     Intake/Output Summary (Last 24 hours) at 12/21/2020 1942 Last data filed at 12/21/2020 1700 Gross per 24 hour  Intake 2238 ml  Output 2280 ml  Net -42 ml   Filed Weights   12/20/20 0500 12/21/20 0604 12/21/20 0941  Weight: 92.9 kg 93.3 kg 93.9 kg   Body mass index is 28.87 kg/m.  Exam: General exam: Alert, awake, oriented x 3 Respiratory system: Clear to auscultation. Respiratory effort normal. Cardiovascular system:RRR. No murmurs, rubs, gallops. Gastrointestinal system: Abdomen is nondistended, soft and nontender. No organomegaly or masses felt. Normal bowel sounds heard. Central nervous system: Alert and oriented. No focal neurological deficits. Extremities: No C/C/E, +pedal pulses Skin: No rashes, lesions or ulcers Psychiatry: Judgement and insight appear normal. Mood & affect appropriate.    Data Reviewed: CBC: Recent Labs  Lab 12/16/20 0552 12/17/20 0304 12/18/20 0116 12/19/20 0547 12/20/20 0028  WBC 11.1* 13.6* 15.7* 17.0* 23.2*  HGB 15.1 15.1 14.0 14.5 14.9  HCT 43.6 43.7 40.6 42.9 42.5  MCV 86.7 87.8 87.1 86.5 84.7  PLT 200 251 293 376 408*   Basic Metabolic Panel: Recent Labs  Lab 12/16/20 0552 12/17/20 0304 12/18/20 0116 12/19/20 0547 12/20/20 0028  NA 129* 135 133* 131* 134*  K 4.0 4.1 3.7 3.6 3.3*  CL 95* 96* 100 99 99  CO2 24 25 19* 20* 26  GLUCOSE 250* 203* 168* 201* 175*  BUN 31* 22* 16 14 10   CREATININE 0.96 0.84 0.85 0.70  0.70 0.64  CALCIUM 8.9 8.8* 8.3* 8.2* 8.2*   GFR: Estimated Creatinine Clearance: 119.2 mL/min (by C-G formula based on SCr of 0.64 mg/dL). Liver Function Tests: Recent Labs  Lab 12/17/20 0304  AST 78*  ALT 38  ALKPHOS 97  BILITOT 0.9  PROT 5.7*  ALBUMIN 2.4*   No results for input(s): LIPASE, AMYLASE in the last 168 hours. Recent Labs  Lab 12/16/20 1207  AMMONIA 27   Coagulation Profile: No results for input(s): INR, PROTIME in the last 168  hours.  Cardiac Enzymes: Recent Labs  Lab 12/16/20 0552  CKTOTAL 53   BNP (last 3 results) No results for input(s): PROBNP in the last 8760 hours. HbA1C: No results for input(s): HGBA1C in the last 72 hours.  CBG: Recent Labs  Lab 12/20/20 1613 12/20/20 2211 12/21/20 0919 12/21/20 1512 12/21/20 1637  GLUCAP 152* 163* 185* 162* 219*   Lipid Profile: No results for input(s): CHOL, HDL, LDLCALC, TRIG, CHOLHDL, LDLDIRECT in the last 72 hours. Thyroid Function Tests: No results for input(s): TSH, T4TOTAL, FREET4, T3FREE, THYROIDAB in the last 72 hours. Anemia Panel: No results for input(s): VITAMINB12, FOLATE, FERRITIN, TIBC, IRON, RETICCTPCT in the last 72 hours. Urine analysis:    Component Value Date/Time   COLORURINE YELLOW 12/16/2020 1615   APPEARANCEUR CLEAR 12/16/2020 1615   LABSPEC 1.018 12/16/2020 1615   PHURINE 6.0 12/16/2020 1615   GLUCOSEU >=500 (A) 12/16/2020 1615   HGBUR LARGE (A) 12/16/2020 1615   BILIRUBINUR NEGATIVE 12/16/2020 1615   KETONESUR 20 (A) 12/16/2020 1615   PROTEINUR 30 (A) 12/16/2020 1615   NITRITE NEGATIVE 12/16/2020 1615   LEUKOCYTESUR NEGATIVE 12/16/2020 1615   Sepsis Labs: @LABRCNTIP (procalcitonin:4,lacticidven:4)  ) Recent Results (from the past 240 hour(s))  SARS CORONAVIRUS 2 (TAT 6-24 HRS) Nasopharyngeal Nasopharyngeal Swab     Status: None   Collection Time: 12/12/20 11:09 PM   Specimen: Nasopharyngeal  Swab  Result Value Ref Range Status   SARS Coronavirus 2 NEGATIVE NEGATIVE Final    Comment: (NOTE) SARS-CoV-2 target nucleic acids are NOT DETECTED.  The SARS-CoV-2 RNA is generally detectable in upper and lower respiratory specimens during the acute phase of infection. Negative results do not preclude SARS-CoV-2 infection, do not rule out co-infections with other pathogens, and should not be used as the sole basis for treatment or other patient management decisions. Negative results must be combined with clinical  observations, patient history, and epidemiological information. The expected result is Negative.  Fact Sheet for Patients: SugarRoll.be  Fact Sheet for Healthcare Providers: https://www.woods-mathews.com/  This test is not yet approved or cleared by the [place] FDA and  has been authorized for detection and/or diagnosis of SARS-CoV-2 by FDA under an Emergency Use Authorization (EUA). This EUA will remain  in effect (meaning this test can be used) for the duration of the COVID-19 declaration under Se ction 564(b)(1) of the Act, 21 U.S.C. section 360bbb-3(b)(1), unless the authorization is terminated or revoked sooner.  Performed at [facility], [place] [address], [place], [place] [address]   Blood culture (routine single)     Status: Abnormal   Collection Time: 12/13/20 12:25 AM   Specimen: BLOOD LEFT FOREARM  Result Value Ref Range Status   Specimen Description BLOOD LEFT FOREARM  Final   Special Requests   Final    BOTTLES DRAWN AEROBIC AND ANAEROBIC Blood Culture results may not be optimal due to an inadequate volume of blood received in culture bottles   Culture  Setup Time   Final    GRAM POSITIVE COCCI IN BOTH AEROBIC AND ANAEROBIC BOTTLES Organism ID to follow CRITICAL RESULT CALLED TO, READ BACK BY AND VERIFIED WITH: PHARMD LYDIA CHEN AT 2041 ON 12/13/20 BY KJ Performed at [facility], [place] [address] [place], [place] [address]    Culture METHICILLIN RESISTANT STAPHYLOCOCCUS AUREUS (A)  Final   Report Status 12/15/2020 FINAL  Final   Organism ID, Bacteria METHICILLIN RESISTANT STAPHYLOCOCCUS AUREUS  Final      Susceptibility   Methicillin resistant staphylococcus aureus - MIC*    CIPROFLOXACIN >=8 RESISTANT Resistant     ERYTHROMYCIN >=8 RESISTANT Resistant     GENTAMICIN <=0.5 SENSITIVE Sensitive     OXACILLIN >=4 RESISTANT Resistant     TETRACYCLINE <=1 SENSITIVE Sensitive     VANCOMYCIN 1 SENSITIVE  Sensitive     TRIMETH/SULFA 80 RESISTANT Resistant     CLINDAMYCIN <=0.25 SENSITIVE Sensitive     RIFAMPIN <=0.5 SENSITIVE Sensitive     Inducible Clindamycin NEGATIVE Sensitive     * METHICILLIN RESISTANT STAPHYLOCOCCUS AUREUS  Blood Culture ID Panel (Reflexed)     Status: Abnormal   Collection Time: 12/13/20 12:25 AM  Result Value Ref Range Status   Enterococcus faecalis NOT DETECTED NOT DETECTED Final   Enterococcus Faecium NOT DETECTED NOT DETECTED Final   Listeria monocytogenes NOT DETECTED NOT DETECTED Final   Staphylococcus species DETECTED (A) NOT DETECTED Final    Comment: CRITICAL RESULT CALLED TO, READ BACK BY AND VERIFIED WITH: PHARMD LYDIA CHEN AT 2045 ON 12/13/20 BY KJ    Staphylococcus aureus (BCID) DETECTED (A) NOT DETECTED Final    Comment: Methicillin (oxacillin)-resistant Staphylococcus aureus (MRSA). MRSA is predictably resistant to beta-lactam antibiotics (except ceftaroline). Preferred therapy is vancomycin unless clinically contraindicated. Patient requires contact precautions if  hospitalized. CRITICAL RESULT CALLED TO, READ BACK BY AND VERIFIED WITH: PHARMD LYDIA CHEN AT 2045  ON 12/13/20 BY KJ    Staphylococcus epidermidis NOT DETECTED NOT DETECTED Final   Staphylococcus lugdunensis NOT DETECTED NOT DETECTED Final   Streptococcus species NOT DETECTED NOT DETECTED Final   Streptococcus agalactiae NOT DETECTED NOT DETECTED Final   Streptococcus pneumoniae NOT DETECTED NOT DETECTED Final   Streptococcus pyogenes NOT DETECTED NOT DETECTED Final   A.calcoaceticus-baumannii NOT DETECTED NOT DETECTED Final   Bacteroides fragilis NOT DETECTED NOT DETECTED Final   Enterobacterales NOT DETECTED NOT DETECTED Final   Enterobacter cloacae complex NOT DETECTED NOT DETECTED Final   Escherichia coli NOT DETECTED NOT DETECTED Final   Klebsiella aerogenes NOT DETECTED NOT DETECTED Final   Klebsiella oxytoca NOT DETECTED NOT DETECTED Final   Klebsiella pneumoniae NOT DETECTED  NOT DETECTED Final   Proteus species NOT DETECTED NOT DETECTED Final   Salmonella species NOT DETECTED NOT DETECTED Final   Serratia marcescens NOT DETECTED NOT DETECTED Final   Haemophilus influenzae NOT DETECTED NOT DETECTED Final   Neisseria meningitidis NOT DETECTED NOT DETECTED Final   Pseudomonas aeruginosa NOT DETECTED NOT DETECTED Final   Stenotrophomonas maltophilia NOT DETECTED NOT DETECTED Final   Candida albicans NOT DETECTED NOT DETECTED Final   Candida auris NOT DETECTED NOT DETECTED Final   Candida glabrata NOT DETECTED NOT DETECTED Final   Candida krusei NOT DETECTED NOT DETECTED Final   Candida parapsilosis NOT DETECTED NOT DETECTED Final   Candida tropicalis NOT DETECTED NOT DETECTED Final   Cryptococcus neoformans/gattii NOT DETECTED NOT DETECTED Final   Meth resistant mecA/C and MREJ DETECTED (A) NOT DETECTED Final    Comment: CRITICAL RESULT CALLED TO, READ BACK BY AND VERIFIED WITH: PHARMD LYDIA CHEN AT 2045 ON 12/13/20 BY KJ Performed at [facility], [address], [place], [place] [address]   Urine culture     Status: Abnormal   Collection Time: 12/13/20 12:30 AM   Specimen: In/Out Cath Urine  Result Value Ref Range Status   Specimen Description IN/OUT CATH URINE  Final   Special Requests NONE  Final   Culture (A)  Final    <10,000 COLONIES/mL INSIGNIFICANT GROWTH Performed at [facility], [place] [address], [place], [place] [address]    Report Status 12/14/2020 FINAL  Final  MRSA Next Gen by PCR, Nasal     Status: Abnormal   Collection Time: 12/14/20 11:31 AM   Specimen: Nasal Mucosa; Nasal Swab  Result Value Ref Range Status   MRSA by PCR Next Gen DETECTED (A) NOT DETECTED Final    Comment: RESULT CALLED TO, READ BACK BY AND VERIFIED WITH: Tawni Levy RN [id number] 12/14/20 A BROWNING (NOTE) The GeneXpert MRSA Assay (FDA approved for NASAL specimens only), is one component of a comprehensive MRSA colonization surveillance program. It is not  intended to diagnose MRSA infection nor to guide or monitor treatment for MRSA infections. Test performance is not FDA approved in patients less than 40 years old. Performed at [facility], [place] [address] [place], [place] [address]   Culture, blood (routine x 2)     Status: None   Collection Time: 12/15/20  6:54 AM   Specimen: BLOOD  Result Value Ref Range Status   Specimen Description BLOOD LEFT ANTECUBITAL  Final   Special Requests   Final    BOTTLES DRAWN AEROBIC AND ANAEROBIC Blood Culture results may not be optimal due to an excessive volume of blood received in culture bottles   Culture   Final    NO GROWTH 5  DAYS Performed at [facility], [place] [address], [place], [place] [address]    Report Status 12/20/2020 FINAL  Final  Culture, blood (routine x 2)     Status: None   Collection Time: 12/15/20  6:54 AM   Specimen: BLOOD LEFT WRIST  Result Value Ref Range Status   Specimen Description BLOOD LEFT WRIST  Final   Special Requests   Final    BOTTLES DRAWN AEROBIC AND ANAEROBIC Blood Culture results may not be optimal due to an excessive volume of blood received in culture bottles   Culture   Final    NO GROWTH 5 DAYS Performed at [facility], [place] [address] [place], [place] [address]    Report Status 12/20/2020 FINAL  Final  Surgical pcr screen     Status: Abnormal   Collection Time: 12/21/20  8:35 AM   Specimen: Nasal Mucosa; Nasal Swab  Result Value Ref Range Status   MRSA, PCR POSITIVE (A) NEGATIVE Final    Comment: RESULT CALLED TO, READ BACK BY AND VERIFIED WITH: RN A.CARTER AT 1023 ON 12/21/2020 BY T.SAAD.    Staphylococcus aureus POSITIVE (A) NEGATIVE Final    Comment: (NOTE) The Xpert SA Assay (FDA approved for NASAL specimens in patients 57 years of age and older), is one component of a comprehensive surveillance program. It is not intended to diagnose infection nor to guide or monitor treatment. Performed at [facility], [place] [address], [place], [place] [address]       Studies: Barbaraann Faster SPINE  Result Date: 12/21/2020 CLINICAL DATA:  Intraoperative localization for thoracic and lumbar laminectomy EXAM: THORACOLUMBAR SPINE - 2 VIEW; DG C-ARM 1-60 MIN COMPARISON:  None. FLUOROSCOPY TIME:  Fluoroscopy Time:  26 seconds Radiation Exposure Index (if provided by the fluoroscopic device): 14.21 mGy Number of Acquired Spot Images: 3 FINDINGS: Three spot films were obtained intraoperatively. Initial spot film shows a needle just below the L3-4 interspace in the posterior soft tissues. Subsequent images demonstrate needle localization in the posterior soft tissues at the T7-T8 level and just below the T10-T11 level. Third image shows inferior localization at T12. IMPRESSION: Intraoperative localization in the thoracic and lumbar spine as described Electronically Signed   By: Inez Catalina M.D.   On: 12/21/2020 16:15   MR CERVICAL SPINE W WO CONTRAST  Result Date: 12/21/2020 CLINICAL DATA:  55 year old male with thoracic and lumbar discitis osteomyelitis, epidural abscess. EXAM: MRI CERVICAL SPINE WITHOUT AND WITH CONTRAST TECHNIQUE: Multiplanar and multiecho pulse sequences of the cervical spine, to include the craniocervical junction and cervicothoracic junction, were obtained without and with intravenous contrast. CONTRAST:  52mL GADAVIST GADOBUTROL 1 MMOL/ML IV SOLN in conjunction with contrast enhanced imaging of the thoracic spine reported separately. COMPARISON:  Thoracic MRI from today reported separately. FINDINGS: Alignment: Straightening of cervical lordosis. No spondylolisthesis. Vertebrae: No marrow edema or evidence of acute osseous abnormality. Bone marrow signal is within normal limits at the skull base and in the cervical spine. Cord: No cervical cord signal abnormality despite degenerative mass effect on the spinal cord detailed below. But there does appear to be faint abnormal posterior dural thickening and  enhancement and leptomeningeal enhancement along the upper thoracic cord as seen on series 35, image 9. Capacious upper thoracic spinal canal. Posterior Fossa, vertebral arteries, paraspinal tissues: Cervicomedullary junction is within normal limits. Negative visible posterior fossa. Motion artifact on the axial cervical spine imaging. Postoperative or posttraumatic changes to the midline soft  tissues at the level of the upper cervical spine. Elsewhere grossly normal visible neck soft tissues. Negative lung apices. Disc levels: Suboptimal due to motion artifact. C2-C3:  Negative. C3-C4: Central disc protrusion best seen on series 20, image 11. Mild spinal stenosis and mild cord mass effect. C4-C5: More broad-based central or left paracentral disc with mild spinal stenosis and mild cord mass effect (series 19, image 17). Moderate appearing bilateral C5 foraminal stenosis. C5-C6: Broad-based posterior disc with mild spinal stenosis and cord mass effect. Moderate left and up to severe right C6 foraminal stenosis. C6-C7: Disc bulging but no spinal stenosis at this level. Mild to moderate bilateral C7 foraminal stenosis. C7-T1:  Mild facet hypertrophy but otherwise negative. IMPRESSION: 1. Partially degraded by motion. No acute or inflammatory process identified in the cervical spine. 2. Faint abnormal dural thickening and leptomeningeal enhancement along the upper thoracic spinal cord, in conjunction with the abnormal Thoracic Spine MRI reported separately today. 3. Cervical spine degeneration with mild spinal stenosis secondary to cervical disc disease C3-C4 through C5-C6. Mild cervical cord mass effect but identified no. Cord signal abnormality Electronically Signed   By: Genevie Ann M.D.   On: 12/21/2020 08:20   MR THORACIC SPINE W WO CONTRAST  Result Date: 12/21/2020 CLINICAL DATA:  55 year old male with lumbar discitis osteomyelitis, worsening lumbar epidural abscess continuing into the lower thoracic spine on MRI  yesterday. EXAM: MRI THORACIC WITHOUT AND WITH CONTRAST TECHNIQUE: Multiplanar and multiecho pulse sequences of the thoracic spine were obtained without and with intravenous contrast. CONTRAST:  11mL GADAVIST GADOBUTROL 1 MMOL/ML IV SOLN COMPARISON:  Lumbar MRI 12/20/2020, 619 22. FINDINGS: Limited cervical spine imaging:  Grossly negative. Thoracic spine segmentation: Normal, concordant with the lumbar numbering. Alignment:  Preserved thoracic kyphosis. Vertebrae: Abnormal marrow edema and patchy enhancement throughout the T7 and T8 vertebral bodies with abnormal intervening disc space, see details below. No other thoracic marrow edema identified. Small benign posterior T4 vertebral body hemangioma suspected. Cord: Upper thoracic spinal cord signal and morphology are normal to the T5 level. See abnormal spinal canal findings from T6 inferiorly detailed below. No associated cord edema is identified. But there is abundant abnormal intradural enhancement, and also some posterior dural thickening described below. Paraspinal and other soft tissues: Circumferential paraspinal phlegmon at T7-T8, see details below. No paraspinal muscle abscess is identified. Negative visible lungs and upper abdominal viscera. Disc levels: C7-T1 through T4-T5 levels are within normal limits. T5-T6: Degenerative moderate to severe right side facet hypertrophy. Degenerative moderate to severe right T5 foraminal stenosis. T6-T7: Disc space remains within normal limits at this level but there is abnormal circumferential dural thickening and enhancement which begins posteriorly at the T5-T6 disc level and continues inferiorly. No stenosis. T7-T8: Abnormal disc space loss with increased T2/STIR hyperintensity and paraspinal phlegmon in keeping with discitis osteomyelitis. Bilateral epidural abscesses including fluid collections emanating from the disc spaces ventrally (series 30, images 9 and 13). Ventral slightly greater than dorsal epidural  abscess measuring up to 5 mm in thickness at this level with mild mass effect on the spinal cord. Associated abnormal dural and leptomeningeal enhancement at this level. T8-T9: Circumferential epidural abscess and abnormal dural and leptomeningeal enhancement continuing inferiorly from the level above with mild mass effect on the spinal cord (series 29, image 25). There is mild increased signal in this disc space although no convincing T9 endplate inflammation at this time. T9-T10: Continued circumferential epidural abscess, more pronounced ventrally (series 29, image 29). Decreasing dural and leptomeningeal enhancement  compared to the levels above. T10-T11: Continued mostly ventral epidural abscess at this level. Mild-to-moderate leptomeningeal and dural enhancement. T11-T12: Continued mostly ventral epidural abscess at this level (series 29, image 35). Mild leptomeningeal and dural enhancement. T12-L1: Mostly ventral epidural abscess. Mild leptomeningeal and dural enhancement (series 33, image 9). IMPRESSION: 1. T7-T8 Discitis Osteomyelitis with multiloculated, circumferential Epidural Abscess associated with abundant leptomeningeal and dural thickening and enhancement. Subsequent compression and mildly tethering the spinal cord at this level. No cord edema. Paraspinal phlegmon but no associated extra-spinal abscess. 2. The abnormal dural thickening continues cephalad to the T5 level. While ventral > dorsal Epidural Abscess along with leptomeningeal thickening and enhancement continues caudally through the thoracolumbar junction. Associated mass effect on the spinal cord from T7 (moderate) through T12 (mild). 3. Possible early discitis also at T8-T9. Electronically Signed   By: Genevie Ann M.D.   On: 12/21/2020 06:20   DG C-Arm 1-60 Min  Result Date: 12/21/2020 CLINICAL DATA:  Intraoperative localization for thoracic and lumbar laminectomy EXAM: THORACOLUMBAR SPINE - 2 VIEW; DG C-ARM 1-60 MIN COMPARISON:  None.  FLUOROSCOPY TIME:  Fluoroscopy Time:  26 seconds Radiation Exposure Index (if provided by the fluoroscopic device): 14.21 mGy Number of Acquired Spot Images: 3 FINDINGS: Three spot films were obtained intraoperatively. Initial spot film shows a needle just below the L3-4 interspace in the posterior soft tissues. Subsequent images demonstrate needle localization in the posterior soft tissues at the T7-T8 level and just below the T10-T11 level. Third image shows inferior localization at T12. IMPRESSION: Intraoperative localization in the thoracic and lumbar spine as described Electronically Signed   By: Inez Catalina M.D.   On: 12/21/2020 16:15    Scheduled Meds:  amLODipine  2.5 mg Oral q morning   atorvastatin  40 mg Oral QHS   Chlorhexidine Gluconate Cloth  6 each Topical Q0600   feeding supplement  237 mL Oral TID BM   fentaNYL       gabapentin  200 mg Oral TID   HYDROcodone-acetaminophen  2 tablet Oral Q6H   icosapent Ethyl  2 g Oral BID   insulin aspart  0-15 Units Subcutaneous TID WC   lactulose  10 g Oral Daily   losartan  25 mg Oral q morning   metoprolol succinate  25 mg Oral QHS   multivitamin with minerals  1 tablet Oral Daily   mupirocin ointment  1 application Nasal BID   nicotine  21 mg Transdermal Daily   oxyCODONE  20 mg Oral Q12H   pantoprazole  40 mg Oral Daily   sodium chloride flush  3 mL Intravenous Q12H   venlafaxine XR  75 mg Oral Q breakfast    Continuous Infusions:  sodium chloride 250 mL (12/21/20 1716)   DAPTOmycin (CUBICIN)  IV 750 mg (12/20/20 2052)   methocarbamol (ROBAXIN) IV       LOS: 8 days     Kathie Dike, MD [facility]   12/21/2020, 7:42 PM

## 2020-12-21 NOTE — Anesthesia Preprocedure Evaluation (Signed)
Anesthesia Evaluation  Patient identified by MRN, date of birth, ID band Patient awake    Reviewed: Allergy & Precautions, NPO status , Patient's Chart, lab work & pertinent test results  Airway Mallampati: II  TM Distance: >3 FB Neck ROM: Full    Dental  (+) Teeth Intact   Pulmonary neg pulmonary ROS, Current Smoker,    Pulmonary exam normal        Cardiovascular hypertension, Pt. on medications and Pt. on home beta blockers + CAD and + Past MI   Rhythm:Regular Rate:Normal     Neuro/Psych negative neurological ROS  negative psych ROS   GI/Hepatic Neg liver ROS, GERD  Medicated,  Endo/Other  diabetes, Type 2, Oral Hypoglycemic Agents  Renal/GU negative Renal ROS  negative genitourinary   Musculoskeletal Epidural abscess with osteomyelitis    Abdominal (+)  Abdomen: soft. Bowel sounds: normal.  Peds  Hematology negative hematology ROS (+)   Anesthesia Other Findings   Reproductive/Obstetrics                             Anesthesia Physical Anesthesia Plan  ASA: 3  Anesthesia Plan: General   Post-op Pain Management:    Induction: Intravenous  PONV Risk Score and Plan: 1 and Ondansetron, Dexamethasone, Midazolam and Treatment may vary due to age or medical condition  Airway Management Planned: Mask and Oral ETT  Additional Equipment: None  Intra-op Plan:   Post-operative Plan: Extubation in OR  Informed Consent: I have reviewed the patients History and Physical, chart, labs and discussed the procedure including the risks, benefits and alternatives for the proposed anesthesia with the patient or authorized representative who has indicated his/her understanding and acceptance.     Dental advisory given  Plan Discussed with: CRNA  Anesthesia Plan Comments: (ECHO 06/22: 1. Left ventricular ejection fraction, by estimation, is 50 to 55%. The  left ventricle has low normal  function. The left ventricle has no regional  wall motion abnormalities. There is moderate concentric left ventricular  hypertrophy. Left ventricular  diastolic parameters are consistent with Grade I diastolic dysfunction  (impaired relaxation).  2. Right ventricular systolic function is normal. The right ventricular  size is normal.  3. Left atrial size was mildly dilated.  4. The mitral valve is normal in structure. No evidence of mitral valve  regurgitation. No evidence of mitral stenosis.  5. The aortic valve is tricuspid. Aortic valve regurgitation is not  visualized. No aortic stenosis is present.  6. The inferior vena cava is normal in size with greater than 50%  respiratory variability, suggesting right atrial pressure of 3 mmHg.  Lab Results      Component                Value               Date                      WBC                      23.2 (H)            12/20/2020                HGB                      14.9  12/20/2020                HCT                      42.5                12/20/2020                MCV                      84.7                12/20/2020                PLT                      408 (H)             12/20/2020           Lab Results      Component                Value               Date                      NA                       134 (L)             12/20/2020                K                        3.3 (L)             12/20/2020                CO2                      26                  12/20/2020                GLUCOSE                  175 (H)             12/20/2020                BUN                      10                  12/20/2020                CREATININE               0.64                12/20/2020                CALCIUM                  8.2 (L)             12/20/2020                GFRNONAA                 >  60                 12/20/2020          )        Anesthesia Quick Evaluation

## 2020-12-21 NOTE — Op Note (Signed)
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 Kendell Bane C. Hiran Leard, DO  ASSISTANT: Dr. Marikay Alar, MD  ANESTHESIA: General Endotracheal  EBL: 200 cc  SPECIMENS: Epidural abscess culture lumbar L3-4, epidural phlegmon L3-4 for tissue culture  DRAINS: None  COMPLICATIONS: None  CONDITION: Hemodynamically stable  HISTORY: Louis [name] is a 55 y.o. male that presented to the emergency department on 619 with severe low back pain and was found to have multilevel ostial discitis and a small epidural abscess.  He was treated with broad-spectrum antibiotics and then progressed to daptomycin as he has a history of MRSA.  His back pain became more severe and therefore a repeat MRI was obtained on 12/16/2020 which showed extensive expansion of the lumbar epidural abscess, with extension into the thoracic spine up to T7-8 with stenosis.  Given his severe pain, his progressive epidural abscess despite medical management, myself and Dr. Yetta Barre recommended evacuation of the abscess via multilevel hemilaminectomies.  All risks, benefits and expected outcomes were discussed with the patient and his wife who agreed to proceed.  PROCEDURE IN DETAIL: The patient was brought to the operating room. After induction of general anesthesia, the patient was positioned on the operative table in the prone position. All pressure points were meticulously padded. Skin  incision was then marked out and prepped and draped in the usual sterile fashion.  Physician driven timeout was performed.  Using lateral fluoroscopy, the 3 incisions were planned out.  Using a 10 blade, the T7-8 region was incised in midline.  Soft tissue dissection was performed with Bovie cautery to expose the left hemilamina and medial facet at T7-8.  Lateral fluoroscopy confirmed the appropriate level.  Self-retaining retractors placed in the wound.  The microscope was sterilely draped and brought into the field for the remainder of the procedure.  Using a high-speed drill, the left lamina of T7 and superior portion of T8 was removed to the ligamentum flavum.  The ligamentum flavum was then carefully dissected from the epidural space and resected using Kerrison rongeurs.  Of note there is epidural phlegmon immediately identified.  As well as evacuation of epidural pus.  The lateral thecal sac was identified and gently retracted medially using a Penfield 4.  Again ventral epidural pus was evacuated using a series of micro curettes and micro nerve hooks.  The abscess continually drained for series of minutes.  Then using a pediatric feeding tube this gently passed ventral to the spinal cord in the epidural space where the abscess was located.  This was passed inferiorly a few centimeters and placed on gentle suction in which further abscess evacuation was performed.  This was then removed.  Hemostasis was achieved with Surgiflo and bipolar cautery.  The thecal sac was noted to be pulsatile and decompressed.  Self-retaining retractors were taken out of the wound.  Hemostasis was achieved with bipolar cautery.  Vancomycin powder was placed in the wound.  The wound was then closed in layers using 0 Vicryl sutures for the fascia, 2-0 and 3-0 Vicryl sutures for the dermis.  The skin was closed with  Dermabond.  Attention was then turned to the T12-L1 region. Using a 10 blade, the T12-L1 region was incised in  midline.  Soft tissue dissection was performed with Bovie cautery to expose the left hemilamina and medial facet at T12-L1.  Lateral fluoroscopy confirmed the appropriate level.  Self-retaining retractors placed in the wound.  The microscope was sterilely draped and brought into the field for the remainder of the procedure.  Using a high-speed drill, the left lamina of T12 and superior portion of L1 was removed to the ligamentum flavum.  The ligamentum flavum was then carefully dissected from the epidural space and resected using Kerrison rongeurs.  The lateral thecal sac was identified and gently retracted medially using a Penfield 4.  Ventral epidural pus was evacuated using a series of micro curettes and micro nerve hooks. Hemostasis was achieved with Surgiflo and bipolar cautery.  The thecal sac was noted to be pulsatile and decompressed.  Self-retaining retractors were taken out of the wound.  The wound was hemostatic.  Vancomycin powder was placed in the wound.  The wound was then closed in layers using 0 Vicryl sutures for the fascia, 2-0 and 3-0 Vicryl sutures for the dermis.  The skin was closed with Dermabond.  Attention was then turned to the L3-4 region. Using a 10 blade, the L3-4 region was incised in midline.  Soft tissue dissection was performed with Bovie cautery to expose the left hemilamina and medial facet at L3-4.  Lateral fluoroscopy confirmed the appropriate level.  Self-retaining retractors placed in the wound.  The microscope was sterilely draped and brought into the field for the remainder of the procedure.  Using a high-speed drill, the left lamina of L3 and superior portion of L4 was removed to the ligamentum flavum.  The ligamentum flavum was then carefully dissected from the epidural space and resected using Kerrison rongeurs.  The lateral thecal sac was identified and gently retracted medially using a Penfield 4.  Ventral epidural pus was evacuated using a series of micro curettes  and micro nerve hooks.  At this level it was somewhat more loculated and phlegmonous.  Hemostasis was achieved with Surgiflo and bipolar cautery.  The thecal sac was noted to be pulsatile and decompressed.  Self-retaining retractors were taken out of the wound.  The wound was hemostatic.  Vancomycin powder was placed in the wound.  The wound was then closed in layers using 0 Vicryl sutures for the fascia, 2-0 and 3-0 Vicryl sutures for the dermis.  The skin was closed with Dermabond.  At the end of the case all sponge, needle, and instrument counts were correct. The patient was then transferred to the stretcher, extubated, and taken to the post-anesthesia care unit in stable hemodynamic condition.

## 2020-12-21 NOTE — Progress Notes (Signed)
   Risks and benefits of left psoas abscess aspiration was discussed with the patient's wife Clydie Braun via phone including, but not limited to bleeding, infection, damage to adjacent structures or low yield requiring additional tests.  All questions were answered and there is agreement to proceed.  Consent signed and in IR

## 2020-12-21 NOTE — Transfer of Care (Signed)
Immediate Anesthesia Transfer of Care Note  Patient: Louis [name]  Procedure(s) Performed: Thoracic seven-eight, Thoracic twelve-Lumbar one, Lumbar three-four laminectomy for Epidural Abscess (Back)  Patient Location: PACU  Anesthesia Type:General  Level of Consciousness: awake, alert  and oriented  Airway & Oxygen Therapy: Patient connected to face mask oxygen  Post-op Assessment: Report given to RN, Post -op Vital signs reviewed and stable and Patient moving all extremities  Post vital signs: Reviewed and stable  Last Vitals:  Vitals Value Taken Time  BP 152/90 12/21/20 1511  Temp    Pulse 102 12/21/20 1513  Resp 14 12/21/20 1513  SpO2 99 % 12/21/20 1513  Vitals shown include unvalidated device data.  Last Pain:  Vitals:   12/21/20 0941  TempSrc: Oral  PainSc:       Patients Stated Pain Goal: 0 (12/21/20 0152)  Complications: No notable events documented.

## 2020-12-22 ENCOUNTER — Encounter (HOSPITAL_COMMUNITY): Payer: Self-pay | Admitting: Neurological Surgery

## 2020-12-22 ENCOUNTER — Inpatient Hospital Stay (HOSPITAL_COMMUNITY): Payer: Federal, State, Local not specified - PPO

## 2020-12-22 DIAGNOSIS — G062 Extradural and subdural abscess, unspecified: Secondary | ICD-10-CM

## 2020-12-22 DIAGNOSIS — K6812 Psoas muscle abscess: Secondary | ICD-10-CM

## 2020-12-22 LAB — GLUCOSE, CAPILLARY
Glucose-Capillary: 310 mg/dL — ABNORMAL HIGH (ref 70–99)
Glucose-Capillary: 256 mg/dL — ABNORMAL HIGH (ref 70–99)
Glucose-Capillary: 251 mg/dL — ABNORMAL HIGH (ref 70–99)
Glucose-Capillary: 252 mg/dL — ABNORMAL HIGH (ref 70–99)

## 2020-12-22 LAB — CBC
HCT: 39.6 % (ref 39.0–52.0)
Hemoglobin: 13.4 g/dL (ref 13.0–17.0)
MCH: 29.3 pg (ref 26.0–34.0)
MCHC: 33.8 g/dL (ref 30.0–36.0)
MCV: 86.7 fL (ref 80.0–100.0)
Platelets: 479 10*3/uL — ABNORMAL HIGH (ref 150–400)
RBC: 4.57 MIL/uL (ref 4.22–5.81)
RDW: 16.6 % — ABNORMAL HIGH (ref 11.5–15.5)
WBC: 27.5 10*3/uL — ABNORMAL HIGH (ref 4.0–10.5)
nRBC: 0 % (ref 0.0–0.2)

## 2020-12-22 LAB — BASIC METABOLIC PANEL
Anion gap: 11 (ref 5–15)
BUN: 13 mg/dL (ref 6–20)
CO2: 25 mmol/L (ref 22–32)
Calcium: 7.9 mg/dL — ABNORMAL LOW (ref 8.9–10.3)
Chloride: 95 mmol/L — ABNORMAL LOW (ref 98–111)
Creatinine, Ser: 0.86 mg/dL (ref 0.61–1.24)
GFR, Estimated: >60 mL/min (ref >60)
Glucose, Bld: 311 mg/dL — ABNORMAL HIGH (ref 70–99)
Potassium: 4.4 mmol/L (ref 3.5–5.1)
Sodium: 131 mmol/L — ABNORMAL LOW (ref 135–145)

## 2020-12-22 MED ORDER — MIDAZOLAM HCL 2 MG/2ML IJ SOLN
INTRAMUSCULAR | Status: AC
  Filled 2020-12-22: qty 6

## 2020-12-22 MED ORDER — FENTANYL CITRATE (PF) 100 MCG/2ML IJ SOLN
INTRAMUSCULAR | Status: AC
  Filled 2020-12-22: qty 6

## 2020-12-22 MED ORDER — FENTANYL CITRATE (PF) 100 MCG/2ML IJ SOLN
INTRAMUSCULAR | Status: AC | PRN
  Administered 2020-12-22: 100 ug via INTRAVENOUS

## 2020-12-22 MED ORDER — MIDAZOLAM HCL 2 MG/2ML IJ SOLN
INTRAMUSCULAR | Status: AC | PRN
  Administered 2020-12-22: 2 mg via INTRAVENOUS
  Administered 2020-12-22 (×2): 1 mg via INTRAVENOUS

## 2020-12-22 NOTE — Evaluation (Signed)
Occupational Therapy Evaluation Patient Details Name: Louis [name] MRN: [id number] DOB: [birth year] Today's Date: 12/22/2020    History of Present Illness 55 year old male who presented to the [facility] emergency room with left-sided back pain x2 days. CT scan unremarkable, but MRI noted osteomyelitis/discitis at L3-S1 and patient was transferred to [facility] 12/12/20. 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 s/p 12/21/20 T7-8, T12-L1, L3-4 laminectomies for evacuation of epidural abscess.    PMH: of CAD, hypertension, uncontrolled diabetes mellitus.   Clinical Impression   Pt PTA: Pt living with spouse at home and reports independence. Pt currently, minguardA to minA for bed mobility and ADL functional mobility with RW in room. Pt performing Adl tasks with set-upA to maxA. Pt with limited strength in BLEs.  Pt education on back precautions. Back handout provided and reviewed ADL in detail. Pt educated on: set an alarm at night for medication, avoid sitting for long periods of time, correct bed positioning for sleeping, correct sequence for bed mobility, avoiding lifting more than 5 pounds and never wash directly over incision. All education is complete and patient indicates understanding. Pt would benefit from continued OT skilled services. OT following acutely.     Follow Up Recommendations  Home health OT    Equipment Recommendations  3 in 1 bedside commode    Recommendations for Other Services       Precautions / Restrictions Precautions Precautions: Back Precaution Booklet Issued: Yes (comment) Precaution Comments: verbal discussion with back handout Restrictions Weight Bearing Restrictions: No      Mobility Bed Mobility Overal bed mobility: Needs Assistance Bed Mobility: Rolling;Sidelying to Sit;Sit to Sidelying Rolling: Supervision Sidelying to sit: Supervision     Sit to sidelying: Supervision General bed  mobility comments: supervision for safety, vc for sequencing to maintain back precautions.    Transfers Overall transfer level: Needs assistance Equipment used: None Transfers: Sit to/from Stand Sit to Stand: Min assist;Min guard         General transfer comment: MinA for intital power up and subsequent was minguardA    Balance Overall balance assessment: Mild deficits observed, not formally tested                                         ADL either performed or assessed with clinical judgement   ADL Overall ADL's : Needs assistance/impaired Eating/Feeding: Set up;Sitting   Grooming: Set up;Sitting   Upper Body Bathing: Minimal assistance;Sitting   Lower Body Bathing: Maximal assistance;Sitting/lateral leans   Upper Body Dressing : Minimal assistance;Sitting   Lower Body Dressing: Maximal assistance;Sitting/lateral leans   Toilet Transfer: Min guard;Cueing for safety;BSC;RW   Toileting- Clothing Manipulation and Hygiene: Moderate assistance;Cueing for safety;Cueing for sequencing;Sitting/lateral lean;Sit to/from stand;Cueing for back precautions       Functional mobility during ADLs: Min guard;Rolling walker General ADL Comments: Pt limited by decreased strength, decreased activity tolerance and increased pain. Pt education provided for back precautions.     Vision Baseline Vision/History: No visual deficits Patient Visual Report: No change from baseline Vision Assessment?: No apparent visual deficits     Perception     Praxis      Pertinent Vitals/Pain Pain Assessment: 0-10 Pain Score: 7  Pain Location: back Pain Descriptors / Indicators: Discomfort Pain Intervention(s): Limited activity within patient's tolerance;Monitored during session     Hand Dominance Right  Extremity/Trunk Assessment Upper Extremity Assessment Upper Extremity Assessment: Overall WFL for tasks assessed   Lower Extremity Assessment Lower Extremity Assessment:  Generalized weakness   Cervical / Trunk Assessment Cervical / Trunk Assessment: Other exceptions Cervical / Trunk Exceptions: s/p thoracic/lumbar   Communication Communication Communication: No difficulties   Cognition Arousal/Alertness: Awake/alert Behavior During Therapy: WFL for tasks assessed/performed Overall Cognitive Status: Within Functional Limits for tasks assessed                                     General Comments  VSS on RA; scheduled for psoas abcess drainage today    Exercises     Shoulder Instructions      Home Living Family/patient expects to be discharged to:: Private residence Living Arrangements: Spouse/significant other Available Help at Discharge: Family;Available 24 hours/day Type of Home: House Home Access: Stairs to enter [employer] of Steps: 4 Entrance Stairs-Rails: Can reach both Home Layout: One level     Bathroom Shower/Tub: Producer, television/film/video: Standard     Home Equipment: Grab bars - tub/shower;Shower seat - built in;Cane - single point;Walker - 2 wheels;Bedside commode;Shower seat;Crutches;Wheelchair - manual          Prior Functioning/Environment Level of Independence: Independent        Comments: works at [employer]        OT Problem List: Decreased strength;Decreased activity tolerance;Impaired balance (sitting and/or standing);Decreased cognition;Decreased safety awareness;Pain;Increased edema;Decreased knowledge of use of DME or AE;Decreased knowledge of precautions      OT Treatment/Interventions: Self-care/ADL training;Therapeutic exercise;Energy conservation;DME and/or AE instruction;Therapeutic activities;Cognitive remediation/compensation;Patient/family education;Balance training;Neuromuscular education    OT Goals(Current goals can be found in the care plan section) Acute Rehab OT Goals Patient Stated Goal: have less pain OT Goal Formulation: With patient Time For Goal  Achievement: 01/05/21 Potential to Achieve Goals: Good ADL Goals Pt Will Perform Grooming: with modified independence;standing Pt Will Perform Lower Body Dressing: with min guard assist;sit to/from stand;with adaptive equipment Pt Will Transfer to Toilet: with supervision;ambulating;bedside commode Pt Will Perform Tub/Shower Transfer: with min guard assist;rolling walker;shower seat;Shower transfer Additional ADL Goal #1: Pt will perform log roll technique with less <2 verbal cues for proper sequencing. Additional ADL Goal #2: Pt will increase to supervisionA for ADL functional mobility with least restrictive AD.  OT Frequency: Min 2X/week   Barriers to D/C:            Co-evaluation              AM-PAC OT "6 Clicks" Daily Activity     Outcome Measure Help from another person eating meals?: None Help from another person taking care of personal grooming?: A Little Help from another person toileting, which includes using toliet, bedpan, or urinal?: A Little Help from another person bathing (including washing, rinsing, drying)?: A Lot Help from another person to put on and taking off regular upper body clothing?: A Little Help from another person to put on and taking off regular lower body clothing?: A Lot 6 Click Score: 17   End of Session Equipment Utilized During Treatment: Gait belt;Rolling walker Nurse Communication: Mobility status  Activity Tolerance: Patient tolerated treatment well;Patient limited by pain Patient left: in bed;with call bell/phone within reach;with bed alarm set  OT Visit Diagnosis: Unsteadiness on feet (R26.81);Muscle weakness (generalized) (M62.81);Pain Pain - part of body:  (back)  Time: 1638-4665 OT Time Calculation (min): 38 min Charges:  OT General Charges $OT Visit: 1 Visit OT Evaluation $OT Eval Moderate Complexity: 1 Mod OT Treatments $Self Care/Home Management : 8-22 mins $Therapeutic Activity: 8-22 mins  Flora Lipps,  OTR/L Acute Rehabilitation Services Pager: [phone] Office: [phone]   Louvinia Cumbo C 12/22/2020, 4:56 PM

## 2020-12-22 NOTE — Progress Notes (Signed)
PROGRESS NOTE  Louis [name] LMB:[id number] DOB: Nov 25, 1965 DOA: 12/12/2020 PCP: Pcp, No  HPI/Recap of past 13 hours: 55 year old male with past medical history of CAD, hypertension, uncontrolled diabetes mellitus who presented to the [facility] emergency room with left-sided back pain x2 days.  CT scan unremarkable, but MRI noted osteomyelitis/discitis at L3-S1 and patient was transferred to [facility] to the hospitalist service on 6/19 given lack of infectious disease and neurosurgical coverage in [place].  Patient also noted to have MRSA bacteremia and a left psoas myositis but no discrete fluid collection.  Started on vancomycin. EDP initially discussed case with neurosurgery who recommended no surgical indications at this time.  Infectious disease evaluated patient recommending TTE to rule out endocarditis which was found to be negative.  Patient was started on daptomycin with plans for an 8-week course.  Due to persistent pain and worsening leukocytosis, MRI was repeated on 6/27 that showed evidence of epidural abscess and psoas abscess.  Neurosurgery formally consulted as patient may need operative management.  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  Sepsis was present on admission, since has stabilized.   Repeat blood cultures drawn on 6/22 and have not shown any growth.   TTE done without signs of vegetations.  ID following. Currently on daptomycin.    Epidural abscess/psoas abscess:  Since patient had continued low back pain, rising WBC count despite IV antibiotics, MRI of the spine was repeated on 6/27.   This showed extensive epidural abscess as well as psoas abscess.   Neurosurgery  consulted and patient underwent operative management on 6/28.  Continue IV antibiotics  CAD Dyslipidemia HTN (hypertension):  Blood pressure elevated, likely related to pain  Uncontrolled diabetes mellitus ([place]):  A1c greater than 9.   Continue sliding scale insulin, hypoglycemic protocol  Overweight (BMI 25.0-29.9): Meets criteria BMI greater than 25 Constipated: CT abdomen negative for SBO.  Continue on lactulose Hyponatremia. Improved with saline infusion  Code Status: Full code Family Communication: None present Disposition Plan: Pending further management of epidural abscess   Consultants: Neurosurgery Infectious disease  Procedures: 2D echo: no evidence of vegetations Open T7, T8 left hemilaminectomy, medial facetectomy; open T12-L1 left hemilaminectomy, medial facetectomy; open L3-4 hemilaminectomy, medial facetectomy for evacuation of multi loculated epidural abscess  Antimicrobials: IV vancomycin 6/19-6/22 Daptomycin 6/22> ongoing  DVT prophylaxis: Lovenox  Level of care: Med-Surg   Subjective No acute issues or events overnight, patient indicates his back pain is somewhat more improved today, looking forward to repeat procedure today to "clean out" the rest -denies nausea vomiting diarrhea constipation headache fevers or chills.  Only complaint currently is that he would like to eat or drink something which we discussed was not possible given his perioperative status.  Objective: Vitals:   12/21/20 2049 12/22/20 0520  BP: (!) 152/88 119/83  Pulse: (!) 108 (!) 103  Resp: 15 18  Temp: 97.7 F (36.5 C) 98.4 F (36.9 C)  SpO2: 95% 96%    Intake/Output Summary (Last 24 hours) at 12/22/2020 0900 Last data filed at 12/22/2020 0824 Gross per 24 hour  Intake 2140 ml  Output 1980 ml  Net 160 ml    Filed Weights   12/21/20 0604 12/21/20 0941 12/22/20 0520  Weight: 93.3 kg 93.9 kg 96.5 kg  Body mass index is 29.67 kg/m.  Exam: General exam: Alert,  awake, oriented x 3 Respiratory system: Clear to auscultation. Respiratory effort normal. Cardiovascular system:RRR. No murmurs, rubs, gallops. Gastrointestinal system: Abdomen is nondistended, soft and nontender. No organomegaly or masses felt. Normal bowel sounds heard. Central nervous system: Alert and oriented. No focal neurological deficits. Extremities: No C/C/E, +pedal pulses Skin: No rashes, lesions or ulcers Psychiatry: Judgement and insight appear normal. Mood & affect appropriate.    Data Reviewed: CBC: Recent Labs  Lab 12/17/20 0304 12/18/20 0116 12/19/20 0547 12/20/20 0028 12/22/20 0504  WBC 13.6* 15.7* 17.0* 23.2* 27.5*  HGB 15.1 14.0 14.5 14.9 13.4  HCT 43.7 40.6 42.9 42.5 39.6  MCV 87.8 87.1 86.5 84.7 86.7  PLT 251 293 376 408* 479*    Basic Metabolic Panel: Recent Labs  Lab 12/17/20 0304 12/18/20 0116 12/19/20 0547 12/20/20 0028 12/22/20 0504  NA 135 133* 131* 134* 131*  K 4.1 3.7 3.6 3.3* 4.4  CL 96* 100 99 99 95*  CO2 25 19* 20* 26 25  GLUCOSE 203* 168* 201* 175* 311*  BUN 22* $Remov'16 14 10 13  'lvcAzt$ CREATININE 0.84 0.85 0.70  0.70 0.64 0.86  CALCIUM 8.8* 8.3* 8.2* 8.2* 7.9*    GFR: Estimated Creatinine Clearance: 112.3 mL/min (by C-G formula based on SCr of 0.86 mg/dL). Liver Function Tests: Recent Labs  Lab 12/17/20 0304  AST 78*  ALT 38  ALKPHOS 97  BILITOT 0.9  PROT 5.7*  ALBUMIN 2.4*    No results for input(s): LIPASE, AMYLASE in the last 168 hours. Recent Labs  Lab 12/16/20 1207  AMMONIA 27    Coagulation Profile: No results for input(s): INR, PROTIME in the last 168 hours.  Cardiac Enzymes: Recent Labs  Lab 12/16/20 0552  CKTOTAL 53    BNP (last 3 results) No results for input(s): PROBNP in the last 8760 hours. HbA1C: No results for input(s): HGBA1C in the last 72 hours.  CBG: Recent Labs  Lab 12/21/20 0919 12/21/20 1512 12/21/20 1637 12/21/20 2156 12/22/20 0819  GLUCAP 185* 162* 219* 258* 310*    Lipid  Profile: No results for input(s): CHOL, HDL, LDLCALC, TRIG, CHOLHDL, LDLDIRECT in the last 72 hours. Thyroid Function Tests: No results for input(s): TSH, T4TOTAL, FREET4, T3FREE, THYROIDAB in the last 72 hours. Anemia Panel: No results for input(s): VITAMINB12, FOLATE, FERRITIN, TIBC, IRON, RETICCTPCT in the last 72 hours. Urine analysis:    Component Value Date/Time   COLORURINE YELLOW 12/16/2020 1615   APPEARANCEUR CLEAR 12/16/2020 1615   LABSPEC 1.018 12/16/2020 1615   PHURINE 6.0 12/16/2020 1615   GLUCOSEU >=500 (A) 12/16/2020 1615   HGBUR LARGE (A) 12/16/2020 1615   BILIRUBINUR NEGATIVE 12/16/2020 1615   KETONESUR 20 (A) 12/16/2020 1615   PROTEINUR 30 (A) 12/16/2020 1615   NITRITE NEGATIVE 12/16/2020 1615   LEUKOCYTESUR NEGATIVE 12/16/2020 1615   Sepsis Labs: $RemoveBefo'@LABRCNTIP'XlmqoouOkcA$ (procalcitonin:4,lacticidven:4)  ) Recent Results (from the past 240 hour(s))  SARS CORONAVIRUS 2 (TAT 6-24 HRS) Nasopharyngeal Nasopharyngeal Swab     Status: None   Collection Time: 12/12/20 11:09 PM   Specimen: Nasopharyngeal Swab  Result Value Ref Range Status   SARS Coronavirus 2 NEGATIVE NEGATIVE Final    Comment: (NOTE) SARS-CoV-2 target nucleic acids are NOT DETECTED.  The SARS-CoV-2 RNA is generally detectable in upper and lower respiratory specimens during the acute phase of infection. Negative results do not preclude SARS-CoV-2 infection, do not rule out co-infections with other pathogens, and should not be used as the  sole basis for treatment or other patient management decisions. Negative results must be combined with clinical observations, patient history, and epidemiological information. The expected result is Negative.  Fact Sheet for Patients: SugarRoll.be  Fact Sheet for Healthcare Providers: https://www.woods-mathews.com/  This test is not yet approved or cleared by the [place] FDA and  has been authorized for detection and/or  diagnosis of SARS-CoV-2 by FDA under an Emergency Use Authorization (EUA). This EUA will remain  in effect (meaning this test can be used) for the duration of the COVID-19 declaration under Se ction 564(b)(1) of the Act, 21 U.S.C. section 360bbb-3(b)(1), unless the authorization is terminated or revoked sooner.  Performed at [facility], [place] [address], [place], [place] [address]   Blood culture (routine single)     Status: Abnormal   Collection Time: 12/13/20 12:25 AM   Specimen: BLOOD LEFT FOREARM  Result Value Ref Range Status   Specimen Description BLOOD LEFT FOREARM  Final   Special Requests   Final    BOTTLES DRAWN AEROBIC AND ANAEROBIC Blood Culture results may not be optimal due to an inadequate volume of blood received in culture bottles   Culture  Setup Time   Final    GRAM POSITIVE COCCI IN BOTH AEROBIC AND ANAEROBIC BOTTLES Organism ID to follow CRITICAL RESULT CALLED TO, READ BACK BY AND VERIFIED WITH: PHARMD LYDIA CHEN AT 2041 ON 12/13/20 BY KJ Performed at [facility], [place] [address] [place], [place] [address]    Culture METHICILLIN RESISTANT STAPHYLOCOCCUS AUREUS (A)  Final   Report Status 12/15/2020 FINAL  Final   Organism ID, Bacteria METHICILLIN RESISTANT STAPHYLOCOCCUS AUREUS  Final      Susceptibility   Methicillin resistant staphylococcus aureus - MIC*    CIPROFLOXACIN >=8 RESISTANT Resistant     ERYTHROMYCIN >=8 RESISTANT Resistant     GENTAMICIN <=0.5 SENSITIVE Sensitive     OXACILLIN >=4 RESISTANT Resistant     TETRACYCLINE <=1 SENSITIVE Sensitive     VANCOMYCIN 1 SENSITIVE Sensitive     TRIMETH/SULFA 80 RESISTANT Resistant     CLINDAMYCIN <=0.25 SENSITIVE Sensitive     RIFAMPIN <=0.5 SENSITIVE Sensitive     Inducible Clindamycin NEGATIVE Sensitive     * METHICILLIN RESISTANT STAPHYLOCOCCUS AUREUS  Blood Culture ID Panel (Reflexed)     Status: Abnormal   Collection Time: 12/13/20 12:25 AM  Result Value Ref Range Status   Enterococcus  faecalis NOT DETECTED NOT DETECTED Final   Enterococcus Faecium NOT DETECTED NOT DETECTED Final   Listeria monocytogenes NOT DETECTED NOT DETECTED Final   Staphylococcus species DETECTED (A) NOT DETECTED Final    Comment: CRITICAL RESULT CALLED TO, READ BACK BY AND VERIFIED WITH: PHARMD LYDIA CHEN AT 2045 ON 12/13/20 BY KJ    Staphylococcus aureus (BCID) DETECTED (A) NOT DETECTED Final    Comment: Methicillin (oxacillin)-resistant Staphylococcus aureus (MRSA). MRSA is predictably resistant to beta-lactam antibiotics (except ceftaroline). Preferred therapy is vancomycin unless clinically contraindicated. Patient requires contact precautions if  hospitalized. CRITICAL RESULT CALLED TO, READ BACK BY AND VERIFIED WITH: PHARMD LYDIA CHEN AT 2045 ON 12/13/20 BY KJ    Staphylococcus epidermidis NOT DETECTED NOT DETECTED Final   Staphylococcus lugdunensis NOT DETECTED NOT DETECTED Final   Streptococcus species NOT DETECTED NOT DETECTED Final   Streptococcus agalactiae NOT DETECTED NOT DETECTED Final   Streptococcus pneumoniae NOT DETECTED NOT DETECTED Final   Streptococcus pyogenes NOT DETECTED NOT DETECTED Final   A.calcoaceticus-baumannii NOT DETECTED NOT DETECTED Final  Bacteroides fragilis NOT DETECTED NOT DETECTED Final   Enterobacterales NOT DETECTED NOT DETECTED Final   Enterobacter cloacae complex NOT DETECTED NOT DETECTED Final   Escherichia coli NOT DETECTED NOT DETECTED Final   Klebsiella aerogenes NOT DETECTED NOT DETECTED Final   Klebsiella oxytoca NOT DETECTED NOT DETECTED Final   Klebsiella pneumoniae NOT DETECTED NOT DETECTED Final   Proteus species NOT DETECTED NOT DETECTED Final   Salmonella species NOT DETECTED NOT DETECTED Final   Serratia marcescens NOT DETECTED NOT DETECTED Final   Haemophilus influenzae NOT DETECTED NOT DETECTED Final   Neisseria meningitidis NOT DETECTED NOT DETECTED Final   Pseudomonas aeruginosa NOT DETECTED NOT DETECTED Final   Stenotrophomonas  maltophilia NOT DETECTED NOT DETECTED Final   Candida albicans NOT DETECTED NOT DETECTED Final   Candida auris NOT DETECTED NOT DETECTED Final   Candida glabrata NOT DETECTED NOT DETECTED Final   Candida krusei NOT DETECTED NOT DETECTED Final   Candida parapsilosis NOT DETECTED NOT DETECTED Final   Candida tropicalis NOT DETECTED NOT DETECTED Final   Cryptococcus neoformans/gattii NOT DETECTED NOT DETECTED Final   Meth resistant mecA/C and MREJ DETECTED (A) NOT DETECTED Final    Comment: CRITICAL RESULT CALLED TO, READ BACK BY AND VERIFIED WITH: PHARMD LYDIA CHEN AT 2045 ON 12/13/20 BY KJ Performed at [facility], [address] [place], [place] [address]   Urine culture     Status: Abnormal   Collection Time: 12/13/20 12:30 AM   Specimen: In/Out Cath Urine  Result Value Ref Range Status   Specimen Description IN/OUT CATH URINE  Final   Special Requests NONE  Final   Culture (A)  Final    <10,000 COLONIES/mL INSIGNIFICANT GROWTH Performed at [facility], [place] [address] [place], [place] [address]    Report Status 12/14/2020 FINAL  Final  MRSA Next Gen by PCR, Nasal     Status: Abnormal   Collection Time: 12/14/20 11:31 AM   Specimen: Nasal Mucosa; Nasal Swab  Result Value Ref Range Status   MRSA by PCR Next Gen DETECTED (A) NOT DETECTED Final    Comment: RESULT CALLED TO, READ BACK BY AND VERIFIED WITH: Tawni Levy RN [id number] 12/14/20 A BROWNING (NOTE) The GeneXpert MRSA Assay (FDA approved for NASAL specimens only), is one component of a comprehensive MRSA colonization surveillance program. It is not intended to diagnose MRSA infection nor to guide or monitor treatment for MRSA infections. Test performance is not FDA approved in patients less than 34 years old. Performed at [facility], [place] [address], [place], [place] [address]   Culture, blood (routine x 2)     Status: None   Collection Time: 12/15/20  6:54 AM   Specimen: BLOOD  Result Value Ref Range  Status   Specimen Description BLOOD LEFT ANTECUBITAL  Final   Special Requests   Final    BOTTLES DRAWN AEROBIC AND ANAEROBIC Blood Culture results may not be optimal due to an excessive volume of blood received in culture bottles   Culture   Final    NO GROWTH 5 DAYS Performed at [facility], [place] [address], [place], [place] [address]    Report Status 12/20/2020 FINAL  Final  Culture, blood (routine x 2)     Status: None   Collection Time: 12/15/20  6:54 AM   Specimen: BLOOD LEFT WRIST  Result Value Ref Range Status   Specimen Description BLOOD LEFT WRIST  Final   Special Requests  Final    BOTTLES DRAWN AEROBIC AND ANAEROBIC Blood Culture results may not be optimal due to an excessive volume of blood received in culture bottles   Culture   Final    NO GROWTH 5 DAYS Performed at [facility] [address] [place], [place] [address]    Report Status 12/20/2020 FINAL  Final  Surgical pcr screen     Status: Abnormal   Collection Time: 12/21/20  8:35 AM   Specimen: Nasal Mucosa; Nasal Swab  Result Value Ref Range Status   MRSA, PCR POSITIVE (A) NEGATIVE Final    Comment: RESULT CALLED TO, READ BACK BY AND VERIFIED WITH: RN A.CARTER AT 1023 ON 12/21/2020 BY T.SAAD.    Staphylococcus aureus POSITIVE (A) NEGATIVE Final    Comment: (NOTE) The Xpert SA Assay (FDA approved for NASAL specimens in patients 54 years of age and older), is one component of a comprehensive surveillance program. It is not intended to diagnose infection nor to guide or monitor treatment. Performed at [facility], [place] [address], [place], [place] [address]   Aerobic/Anaerobic Culture w Gram Stain (surgical/deep wound)     Status: None (Preliminary result)   Collection Time: 12/21/20  1:00 PM   Specimen: Wound; Abscess  Result Value Ref Range Status   Specimen Description ABSCESS  Final   Special Requests LUMBAR 3,4 EPIDURAL PT ON DAPTOMYCIN  Final   Gram Stain   Final    FEW WBC  PRESENT,BOTH PMN AND MONONUCLEAR RARE GRAM POSITIVE COCCI IN PAIRS IN CLUSTERS Performed at [facility], [address] [place], [place] [address]    Culture PENDING  Incomplete   Report Status PENDING  Incomplete  Aerobic/Anaerobic Culture w Gram Stain (surgical/deep wound)     Status: None (Preliminary result)   Collection Time: 12/21/20  1:05 PM   Specimen: Wound; Abscess  Result Value Ref Range Status   Specimen Description ABSCESS  Final   Special Requests LUMBAR 3,4 ABSCESS PT ON DAPTOMYCIN  Final   Gram Stain   Final    MODERATE WBC PRESENT, PREDOMINANTLY PMN RARE GRAM POSITIVE COCCI IN PAIRS Performed at [facility], [place] [address] [place], [place] [address]    Culture PENDING  Incomplete   Report Status PENDING  Incomplete  Aerobic/Anaerobic Culture w Gram Stain (surgical/deep wound)     Status: None (Preliminary result)   Collection Time: 12/21/20  2:17 PM   Specimen: Wound; Abscess  Result Value Ref Range Status   Specimen Description ABSCESS  Final   Special Requests THORACIC 7,8 EPIDURAL PT ON DAPTOMYCIN  Final   Gram Stain   Final    FEW WBC PRESENT, PREDOMINANTLY PMN FEW GRAM POSITIVE COCCI IN PAIRS IN CLUSTERS Performed at [facility], [address] [place], [place] [address]    Culture PENDING  Incomplete   Report Status PENDING  Incomplete  Aerobic/Anaerobic Culture w Gram Stain (surgical/deep wound)     Status: None (Preliminary result)   Collection Time: 12/21/20  2:32 PM   Specimen: PATH Soft tissue  Result Value Ref Range Status   Specimen Description TISSUE  Final   Special Requests LUMBAR L3,4 EPIDURAL PHLEGMON  Final   Gram Stain   Final    NO WBC SEEN NO ORGANISMS SEEN Performed at [facility], [address] [place], [place] [address]    Culture PENDING  Incomplete   Report Status PENDING  Incomplete  Studies: DG THORACOLUMABAR SPINE  Result Date: 12/21/2020 CLINICAL DATA:  Intraoperative localization  for thoracic and lumbar laminectomy EXAM: THORACOLUMBAR SPINE - 2 VIEW; DG C-ARM 1-60 MIN COMPARISON:  None. FLUOROSCOPY TIME:  Fluoroscopy Time:  26 seconds Radiation Exposure Index (if provided by the fluoroscopic device): 14.21 mGy Number of Acquired Spot Images: 3 FINDINGS: Three spot films were obtained intraoperatively. Initial spot film shows a needle just below the L3-4 interspace in the posterior soft tissues. Subsequent images demonstrate needle localization in the posterior soft tissues at the T7-T8 level and just below the T10-T11 level. Third image shows inferior localization at T12. IMPRESSION: Intraoperative localization in the thoracic and lumbar spine as described Electronically Signed   By: Inez Catalina M.D.   On: 12/21/2020 16:15   DG C-Arm 1-60 Min  Result Date: 12/21/2020 CLINICAL DATA:  Intraoperative localization for thoracic and lumbar laminectomy EXAM: THORACOLUMBAR SPINE - 2 VIEW; DG C-ARM 1-60 MIN COMPARISON:  None. FLUOROSCOPY TIME:  Fluoroscopy Time:  26 seconds Radiation Exposure Index (if provided by the fluoroscopic device): 14.21 mGy Number of Acquired Spot Images: 3 FINDINGS: Three spot films were obtained intraoperatively. Initial spot film shows a needle just below the L3-4 interspace in the posterior soft tissues. Subsequent images demonstrate needle localization in the posterior soft tissues at the T7-T8 level and just below the T10-T11 level. Third image shows inferior localization at T12. IMPRESSION: Intraoperative localization in the thoracic and lumbar spine as described Electronically Signed   By: Inez Catalina M.D.   On: 12/21/2020 16:15    Scheduled Meds:  amLODipine  2.5 mg Oral q morning   atorvastatin  40 mg Oral QHS   Chlorhexidine Gluconate Cloth  6 each Topical Q0600   enoxaparin (LOVENOX) injection  40 mg Subcutaneous Q24H   feeding supplement  237 mL Oral TID BM   gabapentin  200 mg Oral TID   HYDROcodone-acetaminophen  2 tablet Oral Q6H   icosapent  Ethyl  2 g Oral BID   insulin aspart  0-15 Units Subcutaneous TID WC   lactulose  10 g Oral Daily   losartan  25 mg Oral q morning   metoprolol succinate  25 mg Oral QHS   multivitamin with minerals  1 tablet Oral Daily   mupirocin ointment  1 application Nasal BID   nicotine  21 mg Transdermal Daily   oxyCODONE  20 mg Oral Q12H   pantoprazole  40 mg Oral Daily   sodium chloride flush  3 mL Intravenous Q12H   venlafaxine XR  75 mg Oral Q breakfast    Continuous Infusions:  sodium chloride 250 mL (12/21/20 1716)   DAPTOmycin (CUBICIN)  IV 750 mg (12/21/20 1957)   methocarbamol (ROBAXIN) IV       LOS: 9 days   Little Ishikawa, DO [facility]   12/22/2020, 9:00 AM

## 2020-12-22 NOTE — Progress Notes (Signed)
To IR

## 2020-12-22 NOTE — Anesthesia Postprocedure Evaluation (Signed)
Anesthesia Post Note  Patient: [name]  Procedure(s) Performed: Thoracic seven-eight, Thoracic twelve-Lumbar one, Lumbar three-four laminectomy for Epidural Abscess (Back)     Patient location during evaluation: PACU Anesthesia Type: General Level of consciousness: awake and alert Pain management: pain level controlled Vital Signs Assessment: post-procedure vital signs reviewed and stable Respiratory status: spontaneous breathing, nonlabored ventilation, respiratory function stable and patient connected to nasal cannula oxygen Cardiovascular status: blood pressure returned to baseline and stable Postop Assessment: no apparent nausea or vomiting Anesthetic complications: no   No notable events documented.  Last Vitals:  Vitals:   12/21/20 2049 12/22/20 0520  BP: (!) 152/88 119/83  Pulse: (!) 108 (!) 103  Resp: 15 18  Temp: 36.5 C 36.9 C  SpO2: 95% 96%    Last Pain:  Vitals:   12/22/20 0520  TempSrc: Oral  PainSc:                  Louis [name]

## 2020-12-22 NOTE — Progress Notes (Signed)
   Providing Compassionate, Quality Care - Together  NEUROSURGERY PROGRESS NOTE   S: No issues overnight. No new complaints, back pain improved compared to preop  O: EXAM:  BP 119/83 (BP Location: Left Arm)   Pulse (!) 103   Temp 98.4 F (36.9 C) (Oral)   Resp 18   Ht 5\' 11"  (1.803 m)   Wt 96.5 kg   SpO2 96%   BMI 29.67 kg/m   Awake, alert, oriented x3 PERRL Speech fluent, appropriate  CNs grossly intact  5/5 BUE/BLE  Incisions c/d/i  ASSESSMENT:  55 y.o. male with   T/L epidural abscess with stenosis, psoas abscess, multilevel osteodiscitis  PLAN: - pt/ot - cx GPC thusfar - pain control - sqh for dvt ppx    Thank you for allowing me to participate in this patient's care.  Please do not hesitate to call with questions or concerns.   56, DO Neurosurgeon [facility] Cell: [phone]

## 2020-12-22 NOTE — Procedures (Signed)
Interventional Radiology Procedure Note  Procedure: CT guided aspiration of left psoas abscess  Indication: Left psoas abscess  Findings:  17 ga Yueh needle placed in left psoas abscess using CT guidance.   5 mL of cloudy bloody material was aspirated. Samples sent for gram stain and culture.  Please refer to procedural dictation for full description.  Complications: None  EBL: < 10 mL  Acquanetta Belling, MD [phone]

## 2020-12-22 NOTE — Progress Notes (Addendum)
[facility]  Date of Admission:  12/12/2020     Total days of antibiotics 11         ASSESSMENT:  Louis [name] is POD #1 and having improved back pain. Surgical specimens with 3 out of 4 gram stains with gram positive cocci. Plan for IR aspiration/drain of psoas abscess today. Anticipate at least 6 weeks of antibiotics starting once source control is achieved. Continue current dose of Daptomycin. CK levels to be checked tomorrow for therapeutic drug monitoring. Wound care per Neurosurgery and pain management per primary team.   PLAN:  Continue current dose of daptomycin. Monitor CK weekly for therapeutic drug monitoring.  IR procedure planned for psoas abscess. Monitor cultures for organism identification.  Wound care per Neurosurgery Pain management per primary team.   Principal Problem:   MRSA bacteremia Active Problems:   CAD (coronary artery disease)   Dyslipidemia   HTN (hypertension)   Uncontrolled diabetes mellitus (HCC)   Vertebral osteomyelitis, acute (HCC)   Tachycardia   Overweight (BMI 25.0-29.9)   Constipated   Back pain   Epidural abscess   Psoas abscess (HCC)    amLODipine  2.5 mg Oral q morning   atorvastatin  40 mg Oral QHS   Chlorhexidine Gluconate Cloth  6 each Topical Q0600   enoxaparin (LOVENOX) injection  40 mg Subcutaneous Q24H   feeding supplement  237 mL Oral TID BM   gabapentin  200 mg Oral TID   HYDROcodone-acetaminophen  2 tablet Oral Q6H   icosapent Ethyl  2 g Oral BID   insulin aspart  0-15 Units Subcutaneous TID WC   lactulose  10 g Oral Daily   losartan  25 mg Oral q morning   metoprolol succinate  25 mg Oral QHS   multivitamin with minerals  1 tablet Oral Daily   mupirocin ointment  1 application Nasal BID   nicotine  21 mg Transdermal Daily   oxyCODONE  20 mg Oral Q12H   pantoprazole  40 mg Oral Daily   sodium chloride flush  3 mL Intravenous Q12H   venlafaxine XR  75 mg Oral Q breakfast     SUBJECTIVE:  Afebrile overnight with no acute events. Thirsty and wanting something to drink. Pain is improved following surgery. Denies fevers/chills.   No Known Allergies   Review of Systems: Review of Systems  Constitutional:  Negative for chills, fever and weight loss.  Respiratory:  Negative for cough, shortness of breath and wheezing.   Cardiovascular:  Negative for chest pain and leg swelling.  Gastrointestinal:  Negative for abdominal pain, constipation, diarrhea, nausea and vomiting.  Musculoskeletal:  Positive for back pain.  Skin:  Negative for rash.     OBJECTIVE: Vitals:   12/21/20 1600 12/21/20 1632 12/21/20 2049 12/22/20 0520  BP:  (!) 145/91 (!) 152/88 119/83  Pulse: (!) 113 (!) 108 (!) 108 (!) 103  Resp: 14  15 18   Temp: 97.9 F (36.6 C) 97.6 F (36.4 C) 97.7 F (36.5 C) 98.4 F (36.9 C)  TempSrc:  Oral Oral Oral  SpO2: 93%  95% 96%  Weight:    96.5 kg  Height:       Body mass index is 29.67 kg/m.  Physical Exam Constitutional:      General: He is not in acute distress.    Appearance: He is well-developed.     Comments: Seated in the chair; pleasant. Appears comfortable.  Cardiovascular:     Rate and Rhythm:  Normal rate and regular rhythm.     Heart sounds: Normal heart sounds.  Pulmonary:     Effort: Pulmonary effort is normal.     Breath sounds: Normal breath sounds.  Skin:    General: Skin is warm and dry.  Neurological:     Mental Status: He is alert and oriented to person, place, and time.  Psychiatric:        Behavior: Behavior normal.        Thought Content: Thought content normal.        Judgment: Judgment normal.    Lab Results Lab Results  Component Value Date   WBC 27.5 (H) 12/22/2020   HGB 13.4 12/22/2020   HCT 39.6 12/22/2020   MCV 86.7 12/22/2020   PLT 479 (H) 12/22/2020    Lab Results  Component Value Date   CREATININE 0.86 12/22/2020   BUN 13 12/22/2020   NA 131 (L) 12/22/2020   K 4.4 12/22/2020   CL 95  (L) 12/22/2020   CO2 25 12/22/2020    Lab Results  Component Value Date   ALT 38 12/17/2020   AST 78 (H) 12/17/2020   ALKPHOS 97 12/17/2020   BILITOT 0.9 12/17/2020     Microbiology: Recent Results (from the past 240 hour(s))  SARS CORONAVIRUS 2 (TAT 6-24 HRS) Nasopharyngeal Nasopharyngeal Swab     Status: None   Collection Time: 12/12/20 11:09 PM   Specimen: Nasopharyngeal Swab  Result Value Ref Range Status   SARS Coronavirus 2 NEGATIVE NEGATIVE Final    Comment: (NOTE) SARS-CoV-2 target nucleic acids are NOT DETECTED.  The SARS-CoV-2 RNA is generally detectable in upper and lower respiratory specimens during the acute phase of infection. Negative results do not preclude SARS-CoV-2 infection, do not rule out co-infections with other pathogens, and should not be used as the sole basis for treatment or other patient management decisions. Negative results must be combined with clinical observations, patient history, and epidemiological information. The expected result is Negative.  Fact Sheet for Patients: HairSlick.no  Fact Sheet for Healthcare Providers: quierodirigir.com  This test is not yet approved or cleared by the [place] FDA and  has been authorized for detection and/or diagnosis of SARS-CoV-2 by FDA under an Emergency Use Authorization (EUA). This EUA will remain  in effect (meaning this test can be used) for the duration of the COVID-19 declaration under Se ction 564(b)(1) of the Act, 21 U.S.C. section 360bbb-3(b)(1), unless the authorization is terminated or revoked sooner.  Performed at [facility], [address], [place], [place] [address]   Blood culture (routine single)     Status: Abnormal   Collection Time: 12/13/20 12:25 AM   Specimen: BLOOD LEFT FOREARM  Result Value Ref Range Status   Specimen Description BLOOD LEFT FOREARM  Final   Special Requests   Final    BOTTLES DRAWN  AEROBIC AND ANAEROBIC Blood Culture results may not be optimal due to an inadequate volume of blood received in culture bottles   Culture  Setup Time   Final    GRAM POSITIVE COCCI IN BOTH AEROBIC AND ANAEROBIC BOTTLES Organism ID to follow CRITICAL RESULT CALLED TO, READ BACK BY AND VERIFIED WITH: PHARMD LYDIA CHEN AT 2041 ON 12/13/20 BY KJ Performed at [facility], [address], [place], [place] [address]    Culture METHICILLIN RESISTANT STAPHYLOCOCCUS AUREUS (A)  Final   Report Status 12/15/2020 FINAL  Final   Organism ID, Bacteria METHICILLIN RESISTANT STAPHYLOCOCCUS AUREUS  Final      Susceptibility   Methicillin resistant staphylococcus aureus - MIC*    CIPROFLOXACIN >=8 RESISTANT Resistant     ERYTHROMYCIN >=8 RESISTANT Resistant     GENTAMICIN <=0.5 SENSITIVE Sensitive     OXACILLIN >=4 RESISTANT Resistant     TETRACYCLINE <=1 SENSITIVE Sensitive     VANCOMYCIN 1 SENSITIVE Sensitive     TRIMETH/SULFA 80 RESISTANT Resistant     CLINDAMYCIN <=0.25 SENSITIVE Sensitive     RIFAMPIN <=0.5 SENSITIVE Sensitive     Inducible Clindamycin NEGATIVE Sensitive     * METHICILLIN RESISTANT STAPHYLOCOCCUS AUREUS  Blood Culture ID Panel (Reflexed)     Status: Abnormal   Collection Time: 12/13/20 12:25 AM  Result Value Ref Range Status   Enterococcus faecalis NOT DETECTED NOT DETECTED Final   Enterococcus Faecium NOT DETECTED NOT DETECTED Final   Listeria monocytogenes NOT DETECTED NOT DETECTED Final   Staphylococcus species DETECTED (A) NOT DETECTED Final    Comment: CRITICAL RESULT CALLED TO, READ BACK BY AND VERIFIED WITH: PHARMD LYDIA CHEN AT 2045 ON 12/13/20 BY KJ    Staphylococcus aureus (BCID) DETECTED (A) NOT DETECTED Final    Comment: Methicillin (oxacillin)-resistant Staphylococcus aureus (MRSA). MRSA is predictably resistant to beta-lactam antibiotics (except ceftaroline). Preferred therapy is vancomycin unless clinically contraindicated. Patient requires contact  precautions if  hospitalized. CRITICAL RESULT CALLED TO, READ BACK BY AND VERIFIED WITH: PHARMD LYDIA CHEN AT 2045 ON 12/13/20 BY KJ    Staphylococcus epidermidis NOT DETECTED NOT DETECTED Final   Staphylococcus lugdunensis NOT DETECTED NOT DETECTED Final   Streptococcus species NOT DETECTED NOT DETECTED Final   Streptococcus agalactiae NOT DETECTED NOT DETECTED Final   Streptococcus pneumoniae NOT DETECTED NOT DETECTED Final   Streptococcus pyogenes NOT DETECTED NOT DETECTED Final   A.calcoaceticus-baumannii NOT DETECTED NOT DETECTED Final   Bacteroides fragilis NOT DETECTED NOT DETECTED Final   Enterobacterales NOT DETECTED NOT DETECTED Final   Enterobacter cloacae complex NOT DETECTED NOT DETECTED Final   Escherichia coli NOT DETECTED NOT DETECTED Final   Klebsiella aerogenes NOT DETECTED NOT DETECTED Final   Klebsiella oxytoca NOT DETECTED NOT DETECTED Final   Klebsiella pneumoniae NOT DETECTED NOT DETECTED Final   Proteus species NOT DETECTED NOT DETECTED Final   Salmonella species NOT DETECTED NOT DETECTED Final   Serratia marcescens NOT DETECTED NOT DETECTED Final   Haemophilus influenzae NOT DETECTED NOT DETECTED Final   Neisseria meningitidis NOT DETECTED NOT DETECTED Final   Pseudomonas aeruginosa NOT DETECTED NOT DETECTED Final   Stenotrophomonas maltophilia NOT DETECTED NOT DETECTED Final   Candida albicans NOT DETECTED NOT DETECTED Final   Candida auris NOT DETECTED NOT DETECTED Final   Candida glabrata NOT DETECTED NOT DETECTED Final   Candida krusei NOT DETECTED NOT DETECTED Final   Candida parapsilosis NOT DETECTED NOT DETECTED Final   Candida tropicalis NOT DETECTED NOT DETECTED Final   Cryptococcus neoformans/gattii NOT DETECTED NOT DETECTED Final   Meth resistant mecA/C and MREJ DETECTED (A) NOT DETECTED Final    Comment: CRITICAL RESULT CALLED TO, READ BACK BY AND VERIFIED WITH: PHARMD LYDIA CHEN AT 2045 ON 12/13/20 BY KJ Performed at [facility],  [address], [place], [place] [address]   Urine culture     Status: Abnormal   Collection Time: 12/13/20 12:30 AM   Specimen: In/Out Cath Urine  Result Value Ref Range Status   Specimen Description IN/OUT CATH URINE  Final   Special Requests NONE  Final   Culture (A)  Final    <10,000 COLONIES/mL INSIGNIFICANT GROWTH Performed at [facility], [address] [place], [place] [address]    Report Status 12/14/2020 FINAL  Final  MRSA Next Gen by PCR, Nasal     Status: Abnormal   Collection Time: 12/14/20 11:31 AM   Specimen: Nasal Mucosa; Nasal Swab  Result Value Ref Range Status   MRSA by PCR Next Gen DETECTED (A) NOT DETECTED Final    Comment: RESULT CALLED TO, READ BACK BY AND VERIFIED WITH: Kennith Gain RN [id number] 12/14/20 A BROWNING (NOTE) The GeneXpert MRSA Assay (FDA approved for NASAL specimens only), is one component of a comprehensive MRSA colonization surveillance program. It is not intended to diagnose MRSA infection nor to guide or monitor treatment for MRSA infections. Test performance is not FDA approved in patients less than 28 years old. Performed at [facility], [address] [place], [place] [address]   Culture, blood (routine x 2)     Status: None   Collection Time: 12/15/20  6:54 AM   Specimen: BLOOD  Result Value Ref Range Status   Specimen Description BLOOD LEFT ANTECUBITAL  Final   Special Requests   Final    BOTTLES DRAWN AEROBIC AND ANAEROBIC Blood Culture results may not be optimal due to an excessive volume of blood received in culture bottles   Culture   Final    NO GROWTH 5 DAYS Performed at [facility], [address], [place], [place] [address]    Report Status 12/20/2020 FINAL  Final  Culture, blood (routine x 2)     Status: None   Collection Time: 12/15/20  6:54 AM   Specimen: BLOOD LEFT WRIST  Result Value Ref Range Status   Specimen Description BLOOD LEFT WRIST  Final   Special Requests   Final    BOTTLES DRAWN AEROBIC  AND ANAEROBIC Blood Culture results may not be optimal due to an excessive volume of blood received in culture bottles   Culture   Final    NO GROWTH 5 DAYS Performed at [facility], [address] [place], [place] [address]    Report Status 12/20/2020 FINAL  Final  Surgical pcr screen     Status: Abnormal   Collection Time: 12/21/20  8:35 AM   Specimen: Nasal Mucosa; Nasal Swab  Result Value Ref Range Status   MRSA, PCR POSITIVE (A) NEGATIVE Final    Comment: RESULT CALLED TO, READ BACK BY AND VERIFIED WITH: RN A.CARTER AT 1023 ON 12/21/2020 BY T.SAAD.    Staphylococcus aureus POSITIVE (A) NEGATIVE Final    Comment: (NOTE) The Xpert SA Assay (FDA approved for NASAL specimens in patients 1 years of age and older), is one component of a comprehensive surveillance program. It is not intended to diagnose infection nor to guide or monitor treatment. Performed at [facility], [address] [place], [place] [address]   Aerobic/Anaerobic Culture w Gram Stain (surgical/deep wound)     Status: None (Preliminary result)   Collection Time: 12/21/20  1:00 PM   Specimen: Wound; Abscess  Result Value Ref Range Status   Specimen Description ABSCESS  Final   Special Requests LUMBAR 3,4 EPIDURAL PT ON DAPTOMYCIN  Final   Gram Stain   Final    FEW WBC PRESENT,BOTH PMN AND MONONUCLEAR RARE GRAM POSITIVE COCCI IN PAIRS IN CLUSTERS    Culture   Final    TOO YOUNG TO READ Performed at [facility]  Lab, [address] [place], [place] [address]    Report Status PENDING  Incomplete  Aerobic/Anaerobic Culture w Gram Stain (surgical/deep wound)     Status: None (Preliminary result)   Collection Time: 12/21/20  1:05 PM   Specimen: Wound; Abscess  Result Value Ref Range Status   Specimen Description ABSCESS  Final   Special Requests LUMBAR 3,4 ABSCESS PT ON DAPTOMYCIN  Final   Gram Stain   Final    MODERATE WBC PRESENT, PREDOMINANTLY PMN RARE GRAM POSITIVE COCCI IN PAIRS     Culture   Final    TOO YOUNG TO READ Performed at [facility], [address] [place], [place] [address]    Report Status PENDING  Incomplete  Aerobic/Anaerobic Culture w Gram Stain (surgical/deep wound)     Status: None (Preliminary result)   Collection Time: 12/21/20  2:17 PM   Specimen: Wound; Abscess  Result Value Ref Range Status   Specimen Description ABSCESS  Final   Special Requests THORACIC 7,8 EPIDURAL PT ON DAPTOMYCIN  Final   Gram Stain   Final    FEW WBC PRESENT, PREDOMINANTLY PMN FEW GRAM POSITIVE COCCI IN PAIRS IN CLUSTERS    Culture   Final    TOO YOUNG TO READ Performed at [facility], [address] [place], [place] [address]    Report Status PENDING  Incomplete  Aerobic/Anaerobic Culture w Gram Stain (surgical/deep wound)     Status: None (Preliminary result)   Collection Time: 12/21/20  2:32 PM   Specimen: PATH Soft tissue  Result Value Ref Range Status   Specimen Description TISSUE  Final   Special Requests LUMBAR L3,4 EPIDURAL PHLEGMON  Final   Gram Stain NO WBC SEEN NO ORGANISMS SEEN   Final   Culture   Final    CULTURE REINCUBATED FOR BETTER GROWTH Performed at [facility], [address] [place], [place] [address]    Report Status PENDING  Incomplete     Marcos EkeGreg Blandina Renaldo, NP [facility]  12/22/2020  11:30 AM

## 2020-12-22 NOTE — Evaluation (Signed)
Physical Therapy Evaluation Patient Details Name: Louis [name] MRN: [id number] DOB: Jan 09, 1966 Today's Date: 12/22/2020   History of Present Illness  55 year old male who presented to the [facility] emergency room with left-sided back pain x2 days. CT scan unremarkable, but MRI noted osteomyelitis/discitis at L3-S1 and patient was transferred to [facility] 12/12/20. 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 s/p 12/21/20 T7-8, T12-L1, L3-4 laminectomies for evacuation of epidural abscess.    PMH: of CAD, hypertension, uncontrolled diabetes mellitus.  Clinical Impression  PTA pt living with spouse in single story home with 4 steps to enter. Pt reports independence with mobility, iADLs and driving. Pt is currently limited in safe mobility by back and hip pain and generalized weakness. Pt is supervision for bed mobility, min guard for transfer and light min A for ambulation without AD. PT recommends HHPT at this juncture however likely pt will progress to point where PT services are not needed. PT will continue to follow acutely.      Follow Up Recommendations Home health PT;Supervision for mobility/OOB    Equipment Recommendations  None recommended by PT (has necessary equipment)    Recommendations for Other Services OT consult     Precautions / Restrictions Precautions Precautions: Back Precaution Booklet Issued: Yes (comment) Precaution Comments: verbal discussion with back handout, MRSA Restrictions Weight Bearing Restrictions: No      Mobility  Bed Mobility Overal bed mobility: Needs Assistance Bed Mobility: Rolling;Sidelying to Sit Rolling: Supervision Sidelying to sit: Supervision       General bed mobility comments: supervision for safety, vc for sequencing to maintain back precautions.    Transfers Overall transfer level: Needs assistance Equipment used: None Transfers: Sit to/from Stand Sit to Stand: Min  guard         General transfer comment: min guard for safety, good power up and steadying,  Ambulation/Gait Ambulation/Gait assistance: Min assist Gait Distance (Feet): 5 Feet Assistive device: None Gait Pattern/deviations: Step-to pattern;Decreased step length - right;Decreased step length - left;Shuffle;Antalgic Gait velocity: slowed Gait velocity interpretation: <1.31 ft/sec, indicative of household ambulator General Gait Details: contact guard assist for safety, mildly tremulous stepping from bed to recliner, will trial RW in next session if pain is not decreased        Balance Overall balance assessment: Mild deficits observed, not formally tested                                           Pertinent Vitals/Pain Pain Assessment: 0-10 Pain Score: 9  Pain Location: back and bilateral anterior hip, 7/10 in supine, 9/10 with movement, settled back to 7/10 reclined in recliner Pain Descriptors / Indicators: Discomfort Pain Intervention(s): Limited activity within patient's tolerance;Monitored during session;Repositioned    Home Living Family/patient expects to be discharged to:: Private residence Living Arrangements: Spouse/significant other Available Help at Discharge: Family;Available 24 hours/day Type of Home: House Home Access: Stairs to enter Entrance Stairs-Rails: Can reach both Entrance Stairs-Number of Steps: 4 Home Layout: One level Home Equipment: Grab bars - tub/shower;Shower seat - built in;Cane - single point;Walker - 2 wheels;Bedside commode;Shower seat;Crutches;Wheelchair - manual      Prior Function Level of Independence: Independent         Comments: works at [employer]   Dominant Hand: Right    Extremity/Trunk Assessment  Upper Extremity Assessment Upper Extremity Assessment: Overall WFL for tasks assessed    Lower Extremity Assessment Lower Extremity Assessment: Generalized weakness    Cervical /  Trunk Assessment Cervical / Trunk Assessment: Other exceptions Cervical / Trunk Exceptions: s/p thoracic/lumbar  Communication   Communication: No difficulties  Cognition Arousal/Alertness: Awake/alert Behavior During Therapy: WFL for tasks assessed/performed Overall Cognitive Status: Within Functional Limits for tasks assessed                                        General Comments General comments (skin integrity, edema, etc.): VSS on RA, scheduled for psoas abcess drainage today        Assessment/Plan    PT Assessment Patient needs continued PT services  PT Problem List Decreased strength;Decreased activity tolerance;Decreased mobility;Decreased coordination;Pain       PT Treatment Interventions DME instruction;Gait training;Stair training;Functional mobility training;Therapeutic activities;Therapeutic exercise;Balance training;Cognitive remediation;Patient/family education    PT Goals (Current goals can be found in the Care Plan section)  Acute Rehab PT Goals Patient Stated Goal: have less pain PT Goal Formulation: With patient Time For Goal Achievement: 01/05/21 Potential to Achieve Goals: Good    Frequency Min 3X/week    AM-PAC PT "6 Clicks" Mobility  Outcome Measure Help needed turning from your back to your side while in a flat bed without using bedrails?: None Help needed moving from lying on your back to sitting on the side of a flat bed without using bedrails?: None Help needed moving to and from a bed to a chair (including a wheelchair)?: A Little Help needed standing up from a chair using your arms (e.g., wheelchair or bedside chair)?: None Help needed to walk in hospital room?: A Little Help needed climbing 3-5 steps with a railing? : A Little 6 Click Score: 21    End of Session   Activity Tolerance: Patient limited by pain Patient left: in chair;with call bell/phone within reach;with chair alarm set Nurse Communication: Mobility  status PT Visit Diagnosis: Unsteadiness on feet (R26.81);Other abnormalities of gait and mobility (R26.89);Pain;Muscle weakness (generalized) (M62.81) Pain - Right/Left:  (back and bilateral hips) Pain - part of body: Hip (back)    Time: 3846-6599 PT Time Calculation (min) (ACUTE ONLY): 17 min   Charges:   PT Evaluation $PT Eval Low Complexity: 1 Low          Fadil Macmaster B. Beverely Risen PT, DPT Acute Rehabilitation Services Pager [phone] Office [phone]   Elon Alas Fleet 12/22/2020, 10:05 AM

## 2020-12-23 LAB — CBC
HCT: 37.1 % — ABNORMAL LOW (ref 39.0–52.0)
HCT: 39.1 % (ref 39.0–52.0)
Hemoglobin: 12.7 g/dL — ABNORMAL LOW (ref 13.0–17.0)
Hemoglobin: 13.4 g/dL (ref 13.0–17.0)
MCH: 29.6 pg (ref 26.0–34.0)
MCH: 29.6 pg (ref 26.0–34.0)
MCHC: 34.2 g/dL (ref 30.0–36.0)
MCHC: 34.3 g/dL (ref 30.0–36.0)
MCV: 86.5 fL (ref 80.0–100.0)
MCV: 86.3 fL (ref 80.0–100.0)
Platelets: 529 10*3/uL — ABNORMAL HIGH (ref 150–400)
Platelets: 538 10*3/uL — ABNORMAL HIGH (ref 150–400)
RBC: 4.29 MIL/uL (ref 4.22–5.81)
RBC: 4.53 MIL/uL (ref 4.22–5.81)
RDW: 16.2 % — ABNORMAL HIGH (ref 11.5–15.5)
RDW: 15.8 % — ABNORMAL HIGH (ref 11.5–15.5)
WBC: 22.3 10*3/uL — ABNORMAL HIGH (ref 4.0–10.5)
WBC: 25.5 10*3/uL — ABNORMAL HIGH (ref 4.0–10.5)
nRBC: 0 % (ref 0.0–0.2)
nRBC: 0 % (ref 0.0–0.2)

## 2020-12-23 LAB — BASIC METABOLIC PANEL
Anion gap: 9 (ref 5–15)
Anion gap: 9 (ref 5–15)
BUN: 7 mg/dL (ref 6–20)
BUN: <5 mg/dL — ABNORMAL LOW (ref 6–20)
CO2: 27 mmol/L (ref 22–32)
CO2: 25 mmol/L (ref 22–32)
Calcium: 7.9 mg/dL — ABNORMAL LOW (ref 8.9–10.3)
Calcium: 8 mg/dL — ABNORMAL LOW (ref 8.9–10.3)
Chloride: 96 mmol/L — ABNORMAL LOW (ref 98–111)
Chloride: 95 mmol/L — ABNORMAL LOW (ref 98–111)
Creatinine, Ser: 0.7 mg/dL (ref 0.61–1.24)
Creatinine, Ser: 0.67 mg/dL (ref 0.61–1.24)
GFR, Estimated: >60 mL/min (ref >60)
GFR, Estimated: >60 mL/min (ref >60)
Glucose, Bld: 263 mg/dL — ABNORMAL HIGH (ref 70–99)
Glucose, Bld: 234 mg/dL — ABNORMAL HIGH (ref 70–99)
Potassium: 4.1 mmol/L (ref 3.5–5.1)
Potassium: 3.5 mmol/L (ref 3.5–5.1)
Sodium: 132 mmol/L — ABNORMAL LOW (ref 135–145)
Sodium: 129 mmol/L — ABNORMAL LOW (ref 135–145)

## 2020-12-23 LAB — GLUCOSE, CAPILLARY
Glucose-Capillary: 261 mg/dL — ABNORMAL HIGH (ref 70–99)
Glucose-Capillary: 222 mg/dL — ABNORMAL HIGH (ref 70–99)
Glucose-Capillary: 203 mg/dL — ABNORMAL HIGH (ref 70–99)
Glucose-Capillary: 226 mg/dL — ABNORMAL HIGH (ref 70–99)
Glucose-Capillary: 200 mg/dL — ABNORMAL HIGH (ref 70–99)

## 2020-12-23 LAB — CK: Total CK: 180 U/L (ref 49–397)

## 2020-12-23 LAB — TROPONIN I (HIGH SENSITIVITY): Troponin I (High Sensitivity): 30 ng/L — ABNORMAL HIGH (ref <18)

## 2020-12-23 MED ORDER — ALUM & MAG HYDROXIDE-SIMETH 200-200-20 MG/5ML PO SUSP
30.0000 mL | Freq: Once | ORAL | Status: AC
  Administered 2020-12-23: 30 mL via ORAL
  Filled 2020-12-23: qty 30

## 2020-12-23 MED ORDER — OXYCODONE HCL 5 MG PO TABS
15.0000 mg | ORAL_TABLET | ORAL | Status: DC | PRN
  Administered 2020-12-23 – 2020-12-26 (×13): 15 mg via ORAL
  Filled 2020-12-23 (×13): qty 3

## 2020-12-23 MED ORDER — HYDROMORPHONE HCL 1 MG/ML IJ SOLN
0.5000 mg | Freq: Four times a day (QID) | INTRAMUSCULAR | Status: DC | PRN
  Filled 2020-12-23: qty 1

## 2020-12-23 MED ORDER — LORAZEPAM 2 MG/ML IJ SOLN
1.0000 mg | Freq: Three times a day (TID) | INTRAMUSCULAR | Status: DC | PRN
  Administered 2020-12-23: 1 mg via INTRAVENOUS
  Filled 2020-12-23: qty 1

## 2020-12-23 MED ORDER — OXYCODONE HCL ER 10 MG PO T12A: 10.0000 mg | EXTENDED_RELEASE_TABLET | Freq: Two times a day (BID) | ORAL | Status: DC

## 2020-12-23 MED ORDER — LIDOCAINE VISCOUS HCL 2 % MT SOLN
15.0000 mL | Freq: Once | OROMUCOSAL | Status: AC
  Administered 2020-12-23: 15 mL via ORAL
  Filled 2020-12-23: qty 15

## 2020-12-23 MED ORDER — HYDROMORPHONE HCL 1 MG/ML IJ SOLN
0.5000 mg | Freq: Four times a day (QID) | INTRAMUSCULAR | Status: DC | PRN
  Administered 2020-12-23 – 2020-12-24 (×2): 0.5 mg via INTRAVENOUS
  Filled 2020-12-23 (×2): qty 1

## 2020-12-23 MED ORDER — HYDROMORPHONE HCL 1 MG/ML IJ SOLN
0.5000 mg | Freq: Once | INTRAMUSCULAR | Status: AC
  Administered 2020-12-23: 0.5 mg via INTRAVENOUS
  Filled 2020-12-23: qty 1

## 2020-12-23 MED ORDER — POLYETHYLENE GLYCOL 3350 17 G PO PACK
17.0000 g | PACK | Freq: Two times a day (BID) | ORAL | Status: DC
  Administered 2020-12-24 – 2020-12-25 (×2): 17 g via ORAL
  Filled 2020-12-23 (×3): qty 1

## 2020-12-23 MED ORDER — OXYCODONE HCL ER 10 MG PO T12A
20.0000 mg | EXTENDED_RELEASE_TABLET | Freq: Two times a day (BID) | ORAL | Status: DC
  Administered 2020-12-23 – 2020-12-26 (×6): 20 mg via ORAL
  Filled 2020-12-23 (×6): qty 2

## 2020-12-23 NOTE — Progress Notes (Signed)
PHARMACY CONSULT NOTE FOR:  OUTPATIENT  PARENTERAL ANTIBIOTIC THERAPY (OPAT)  Indication: MRSA bacteremia and lumbar spine osteomyelitis  Regimen: Daptomycin 750 mg every 24 hours  End date: 02/02/21  IV antibiotic discharge orders are pended. To discharging provider:  please sign these orders via discharge navigator,  Select New Orders & click on the button choice - Manage This Unsigned Work.     Thank you for allowing pharmacy to be a part of this patient's care.  Sharin Mons, PharmD, BCPS, BCIDP Infectious Diseases Clinical Pharmacist Phone: [phone] 12/23/2020, 11:37 AM

## 2020-12-23 NOTE — Plan of Care (Signed)
  Problem: Activity: Goal: Risk for activity intolerance will decrease Outcome: Progressing   Problem: Nutrition: Goal: Adequate nutrition will be maintained Outcome: Progressing   Problem: Pain Managment: Goal: General experience of comfort will improve Outcome: Progressing   Problem: Skin Integrity: Goal: Risk for impaired skin integrity will decrease Outcome: Progressing   

## 2020-12-23 NOTE — Progress Notes (Signed)
[facility]  Date of Admission:  12/12/2020     Total days of antibiotics 12         ASSESSMENT:  Louis [name] is POD 2 from multilevel abscess evacuation and now s/p psoas aspiration. Having increased back pain today compared to yesterday likely related to most recent surgeries and inflammation. Surgical specimens are growing Staph Aureus with sensitivities pending although suspect MRSA given previous bacteremia. Blood cultures are clear and is okay for PICC line placement. Discussed plan of care to include 6 weeks of IV therapy with Daptomycin. Home health and OPAT orders will be placed. Continue pain management per primary team. Will need follow up MRI in the next 3-[redacted] weeks along with [facility] follow up.   ID will sign off and follow cultures peripherally.   PLAN:  Continue daptomycin. Home Health/OPAT orders below. Monitor surgical specimens pending sensitivities.  Okay for PICC line placement. Pain management per primary team.   Diagnosis:  MRSA bacteremia and Epidural Abcess  Culture Result: MRSA  No Known Allergies  OPAT Orders Discharge antibiotics to be given via PICC line Discharge antibiotics: Daptomycin Per pharmacy protocol  Aim for Vancomycin trough 15-20 or AUC 400-550 (unless otherwise indicated) Duration: 6 weeks End Date: 02/02/21  [facility] Per Protocol:  Home health RN for IV administration and teaching; PICC line care and labs.    Labs weekly while on IV antibiotics: _X_ CBC with differential _X_ BMP __ CMP _X_ CRP (Non-cardiac) _X_ ESR __ Vancomycin trough _X_ CK  _X_ Please pull PIC at completion of IV antibiotics __ Please leave PIC in place until doctor has seen patient or been notified  Fax weekly labs to [phone]  Clinic Follow Up Appt:  7/29 at 3pm with Dr. Gale Journey   Principal Problem:   MRSA bacteremia Active Problems:   CAD (coronary artery disease)   Dyslipidemia   HTN (hypertension)    Uncontrolled diabetes mellitus ([place])   Vertebral osteomyelitis, acute ([place])   Tachycardia   Overweight (BMI 25.0-29.9)   Constipated   Back pain   Epidural abscess   Psoas abscess (HCC)    amLODipine  2.5 mg Oral q morning   atorvastatin  40 mg Oral QHS   Chlorhexidine Gluconate Cloth  6 each Topical Q0600   enoxaparin (LOVENOX) injection  40 mg Subcutaneous Q24H   feeding supplement  237 mL Oral TID BM   gabapentin  200 mg Oral TID   icosapent Ethyl  2 g Oral BID   insulin aspart  0-15 Units Subcutaneous TID WC   lactulose  10 g Oral Daily   losartan  25 mg Oral q morning   metoprolol succinate  25 mg Oral QHS   multivitamin with minerals  1 tablet Oral Daily   mupirocin ointment  1 application Nasal BID   nicotine  21 mg Transdermal Daily   oxyCODONE  20 mg Oral Q12H   pantoprazole  40 mg Oral Daily   sodium chloride flush  3 mL Intravenous Q12H   venlafaxine XR  75 mg Oral Q breakfast    SUBJECTIVE:  Afebrile overnight with no acute events and improving leukocytosis. Increased back pain today with some radiculopathy down the right leg. Denies fevers/chills.   No Known Allergies   Review of Systems: Review of Systems  Constitutional:  Negative for chills, fever and weight loss.  Respiratory:  Negative for cough, shortness of breath and wheezing.   Cardiovascular:  Negative for chest pain  and leg swelling.  Gastrointestinal:  Negative for abdominal pain, constipation, diarrhea, nausea and vomiting.  Musculoskeletal:  Positive for back pain.  Skin:  Negative for rash.     OBJECTIVE: Vitals:   12/22/20 1651 12/22/20 2055 12/23/20 0500 12/23/20 1053  BP: (!) 156/98 (!) 153/89 (!) 159/92 (!) 142/88  Pulse: (!) 105 97 (!) 106 99  Resp: _0 Temp: 97.6 F (36.4 C) 98 F (36.7 C) (!) 97.5 F (36.4 C) 97.9 F (36.6 C)  TempSrc: Oral  Oral   SpO2: 100% 96% 93% 94%  Weight:  96.5 kg 96.6 kg   Height:       Body mass index is 29.7 kg/m.  Physical  Exam Constitutional:      General: He is not in acute distress.    Appearance: He is well-developed.     Comments: Lying in bed with head of bed elevated.   Cardiovascular:     Rate and Rhythm: Normal rate and regular rhythm.     Heart sounds: Normal heart sounds.  Pulmonary:     Effort: Pulmonary effort is normal.     Breath sounds: Normal breath sounds.  Skin:    General: Skin is warm and dry.  Neurological:     Mental Status: He is alert.  Psychiatric:        Mood and Affect: Mood normal.    Lab Results Lab Results  Component Value Date   WBC 22.3 (H) 12/23/2020   HGB 12.7 (L) 12/23/2020   HCT 37.1 (L) 12/23/2020   MCV 86.5 12/23/2020   PLT 529 (H) 12/23/2020    Lab Results  Component Value Date   CREATININE 0.70 12/23/2020   BUN 7 12/23/2020   NA 132 (L) 12/23/2020   K 4.1 12/23/2020   CL 96 (L) 12/23/2020   CO2 27 12/23/2020    Lab Results  Component Value Date   ALT 38 12/17/2020   AST 78 (H) 12/17/2020   ALKPHOS 97 12/17/2020   BILITOT 0.9 12/17/2020     Microbiology: Recent Results (from the past 240 hour(s))  MRSA Next Gen by PCR, Nasal     Status: Abnormal   Collection Time: 12/14/20 11:31 AM   Specimen: Nasal Mucosa; Nasal Swab  Result Value Ref Range Status   MRSA by PCR Next Gen DETECTED (A) NOT DETECTED Final    Comment: RESULT CALLED TO, READ BACK BY AND VERIFIED WITH: Tawni Levy RN [id number] 12/14/20 A BROWNING (NOTE) The GeneXpert MRSA Assay (FDA approved for NASAL specimens only), is one component of a comprehensive MRSA colonization surveillance program. It is not intended to diagnose MRSA infection nor to guide or monitor treatment for MRSA infections. Test performance is not FDA approved in patients less than 34 years old. Performed at [facility], [place] [address] [place], [place] [address]   Culture, blood (routine x 2)     Status: None   Collection Time: 12/15/20  6:54 AM   Specimen: BLOOD  Result Value Ref Range Status    Specimen Description BLOOD LEFT ANTECUBITAL  Final   Special Requests   Final    BOTTLES DRAWN AEROBIC AND ANAEROBIC Blood Culture results may not be optimal due to an excessive volume of blood received in culture bottles   Culture   Final    NO GROWTH 5 DAYS Performed at [facility], [place] [address], [place], [place] [address]    Report Status 12/20/2020 FINAL  Final  Culture, blood (routine x 2)     Status: None   Collection Time: 12/15/20  6:54 AM   Specimen: BLOOD LEFT WRIST  Result Value Ref Range Status   Specimen Description BLOOD LEFT WRIST  Final   Special Requests   Final    BOTTLES DRAWN AEROBIC AND ANAEROBIC Blood Culture results may not be optimal due to an excessive volume of blood received in culture bottles   Culture   Final    NO GROWTH 5 DAYS Performed at [facility], [place] [address] [place], [place] [address]    Report Status 12/20/2020 FINAL  Final  Surgical pcr screen     Status: Abnormal   Collection Time: 12/21/20  8:35 AM   Specimen: Nasal Mucosa; Nasal Swab  Result Value Ref Range Status   MRSA, PCR POSITIVE (A) NEGATIVE Final    Comment: RESULT CALLED TO, READ BACK BY AND VERIFIED WITH: RN A.CARTER AT 1023 ON 12/21/2020 BY T.SAAD.    Staphylococcus aureus POSITIVE (A) NEGATIVE Final    Comment: (NOTE) The Xpert SA Assay (FDA approved for NASAL specimens in patients 31 years of age and older), is one component of a comprehensive surveillance program. It is not intended to diagnose infection nor to guide or monitor treatment. Performed at [facility], [place] [address] [place], [place] [address]   Aerobic/Anaerobic Culture w Gram Stain (surgical/deep wound)     Status: None (Preliminary result)   Collection Time: 12/21/20  1:00 PM   Specimen: Wound; Abscess  Result Value Ref Range Status   Specimen Description ABSCESS  Final   Special Requests LUMBAR 3,4 EPIDURAL PT ON DAPTOMYCIN  Final   Gram Stain   Final    FEW WBC PRESENT,BOTH  PMN AND MONONUCLEAR RARE GRAM POSITIVE COCCI IN PAIRS IN CLUSTERS    Culture   Final    FEW STAPHYLOCOCCUS AUREUS SUSCEPTIBILITIES TO FOLLOW Performed at [facility], [place] [address], [place], [place] [address]    Report Status PENDING  Incomplete  Aerobic/Anaerobic Culture w Gram Stain (surgical/deep wound)     Status: None (Preliminary result)   Collection Time: 12/21/20  1:05 PM   Specimen: Wound; Abscess  Result Value Ref Range Status   Specimen Description ABSCESS  Final   Special Requests LUMBAR 3,4 ABSCESS PT ON DAPTOMYCIN  Final   Gram Stain   Final    MODERATE WBC PRESENT, PREDOMINANTLY PMN RARE GRAM POSITIVE COCCI IN PAIRS    Culture   Final    RARE STAPHYLOCOCCUS AUREUS SUSCEPTIBILITIES TO FOLLOW Performed at [facility], [place] [address], [place], [place] [address]    Report Status PENDING  Incomplete  Aerobic/Anaerobic Culture w Gram Stain (surgical/deep wound)     Status: None (Preliminary result)   Collection Time: 12/21/20  2:17 PM   Specimen: Wound; Abscess  Result Value Ref Range Status   Specimen Description ABSCESS  Final   Special Requests THORACIC 7,8 EPIDURAL PT ON DAPTOMYCIN  Final   Gram Stain   Final    FEW WBC PRESENT, PREDOMINANTLY PMN FEW GRAM POSITIVE COCCI IN PAIRS IN CLUSTERS Performed at [facility], [address], [place], [place] [address]    Culture FEW STAPHYLOCOCCUS AUREUS  Final   Report Status PENDING  Incomplete  Aerobic/Anaerobic Culture w Gram Stain (surgical/deep wound)     Status: None (Preliminary result)   Collection Time: 12/21/20  2:32 PM   Specimen: PATH Soft tissue  Result  Value Ref Range Status   Specimen Description TISSUE  Final   Special Requests LUMBAR L3,4 EPIDURAL PHLEGMON  Final   Gram Stain NO WBC SEEN NO ORGANISMS SEEN   Final   Culture   Final    RARE STAPHYLOCOCCUS AUREUS SUSCEPTIBILITIES TO FOLLOW Performed at [facility], [address], [place], [place] [address]    Report  Status PENDING  Incomplete  Aerobic/Anaerobic Culture w Gram Stain (surgical/deep wound)     Status: None (Preliminary result)   Collection Time: 12/22/20  4:12 PM   Specimen: Abscess  Result Value Ref Range Status   Specimen Description ABSCESS  Final   Special Requests LEFT PSOAS  Final   Gram Stain   Final    MODERATE WBC PRESENT, PREDOMINANTLY PMN FEW GRAM POSITIVE COCCI IN CLUSTERS    Culture   Final    TOO YOUNG TO READ Performed at [facility], [place] [address], [place], [place] [address]    Report Status PENDING  Incomplete     Terri Piedra, [facility]  12/23/2020  12:13 PM

## 2020-12-23 NOTE — Progress Notes (Signed)
PROGRESS NOTE  Louis [name] ZWC:[id number] DOB: Jan 12, 1966 DOA: 12/12/2020 PCP: Pcp, No  HPI/Recap of past 73 hours: 55 year old male with past medical history of CAD, hypertension, uncontrolled diabetes mellitus who presented to the [facility] emergency room with left-sided back pain x2 days.  CT scan unremarkable, but MRI noted osteomyelitis/discitis at L3-S1 and patient was transferred to [facility] to the hospitalist service on 6/19 given lack of infectious disease and neurosurgical coverage in [place].  Patient also noted to have MRSA bacteremia and a left psoas myositis but no discrete fluid collection.  Started on vancomycin. EDP initially discussed case with neurosurgery who recommended no surgical indications at this time.  Infectious disease evaluated patient recommending TTE to rule out endocarditis which was found to be negative.  Patient was started on daptomycin with plans for an 8-week course.  Due to persistent pain and worsening leukocytosis, MRI was repeated on 6/27 that showed evidence of epidural abscess and psoas abscess.  Neurosurgery formally consulted as patient may need operative management.  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  Sepsis was present on admission, since has stabilized.   Repeat blood cultures drawn on 6/22 and have not shown any growth TTE done without signs of vegetations.  ID following. Currently on daptomycin -continue through 02/02/2021 then transition to p.o. antibiotics at outpatient infectious disease follow-up; repeat imaging in 3 to 4 weeks to ensure improvement and control of infection.  Epidural abscess/psoas abscess:  Since patient had continued  low back pain, rising WBC count despite IV antibiotics, MRI of the spine was repeated on 6/27 This showed extensive epidural abscess as well as psoas abscess.   Neurosurgery consulted and patient underwent operative management on 6/28.  Continue IV antibiotics as above per ID through 02/02/21  CAD Dyslipidemia HTN (hypertension):  Blood pressure elevated, likely related to pain  Uncontrolled diabetes mellitus ([place]):  A1c greater than 9.   Continue sliding scale insulin, hypoglycemic protocol  Overweight (BMI 25.0-29.9): Meets criteria BMI greater than 25 Constipated: CT abdomen negative for SBO.  Continue on lactulose Hyponatremia. Improved with saline infusion  Code Status: Full code Family Communication: None present Disposition Plan: Pending further management of epidural abscess - will likely need PICC line placement and home care/abx set up prior to discharge.  Consultants: Neurosurgery Infectious disease  Procedures: 2D echo: no evidence of vegetations Open T7, T8 left hemilaminectomy, medial facetectomy; open T12-L1 left hemilaminectomy, medial facetectomy; open L3-4 hemilaminectomy, medial facetectomy for evacuation of multi loculated epidural abscess  Antimicrobials: IV vancomycin 6/19-6/22 Daptomycin 6/22> ongoing through 02/02/21 per ID  DVT prophylaxis: Lovenox  Level of care: Med-Surg   Subjective No acute issues or events overnight, patient continues to complain of intractable back pain, will attempt to adjust schedule today but otherwise recommending increased ambulation and out of bed as tolerated.  Denies nausea vomiting diarrhea constipation headache fevers or chills.  Objective: Vitals:   12/22/20 2055 12/23/20 0500  BP: (!) 153/89 (!) 159/92  Pulse: 97 (!) 106  Resp: 17 17  Temp: 98 F (36.7 C) (!) 97.5 F (36.4 C)  SpO2: 96% 93%    Intake/Output Summary (Last 24 hours) at 12/23/2020 0817 Last data filed at 12/23/2020 0415 Gross per 24 hour   Intake 822.51 ml  Output 2225 ml  Net -1402.49 ml    Filed Weights   12/22/20 0520 12/22/20 2055 12/23/20 0500  Weight: 96.5 kg 96.5 kg 96.6 kg   Body mass index is 29.7 kg/m.  Exam: General exam: Alert, awake, oriented x 3 Respiratory system: Clear to auscultation. Respiratory effort normal. Cardiovascular system:RRR. No murmurs, rubs, gallops. Gastrointestinal system: Abdomen is nondistended, soft and nontender. No organomegaly or masses felt. Normal bowel sounds heard. Central nervous system: Alert and oriented. No focal neurological deficits. Extremities: No C/C/E, +pedal pulses Skin: No rashes, lesions or ulcers Psychiatry: Judgement and insight appear normal. Mood & affect appropriate.    Data Reviewed: CBC: Recent Labs  Lab 12/18/20 0116 12/19/20 0547 12/20/20 0028 12/22/20 0504 12/23/20 0620  WBC 15.7* 17.0* 23.2* 27.5* 22.3*  HGB 14.0 14.5 14.9 13.4 12.7*  HCT 40.6 42.9 42.5 39.6 37.1*  MCV 87.1 86.5 84.7 86.7 86.5  PLT 293 376 408* 479* 529*    Basic Metabolic Panel: Recent Labs  Lab 12/18/20 0116 12/19/20 0547 12/20/20 0028 12/22/20 0504 12/23/20 0620  NA 133* 131* 134* 131* 132*  K 3.7 3.6 3.3* 4.4 4.1  CL 100 99 99 95* 96*  CO2 19* 20* _0 GLUCOSE 168* 201* 175* 311* 263*  BUN _1 CREATININE 0.85 0.70  0.70 0.64 0.86 0.70  CALCIUM 8.3* 8.2* 8.2* 7.9* 7.9*    GFR: Estimated Creatinine Clearance: 120.8 mL/min (by C-G formula based on SCr of 0.7 mg/dL). Liver Function Tests: Recent Labs  Lab 12/17/20 0304  AST 78*  ALT 38  ALKPHOS 97  BILITOT 0.9  PROT 5.7*  ALBUMIN 2.4*    No results for input(s): LIPASE, AMYLASE in the last 168 hours. Recent Labs  Lab 12/16/20 1207  AMMONIA 27    Coagulation Profile: No results for input(s): INR, PROTIME in the last 168 hours.  Cardiac Enzymes: Recent Labs  Lab 12/23/20 0620  CKTOTAL 180    BNP (last 3 results) No results for input(s): PROBNP in the last 8760  hours. HbA1C: No results for input(s): HGBA1C in the last 72 hours.  CBG: Recent Labs  Lab 12/21/20 2156 12/22/20 0819 12/22/20 1146 12/22/20 1649 12/22/20 2054  GLUCAP 258* 310* 256* 251* 252*    Lipid Profile: No results for input(s): CHOL, HDL, LDLCALC, TRIG, CHOLHDL, LDLDIRECT in the last 72 hours. Thyroid Function Tests: No results for input(s): TSH, T4TOTAL, FREET4, T3FREE, THYROIDAB in the last 72 hours. Anemia Panel: No results for input(s): VITAMINB12, FOLATE, FERRITIN, TIBC, IRON, RETICCTPCT in the last 72 hours. Urine analysis:    Component Value Date/Time   COLORURINE YELLOW 12/16/2020 1615   APPEARANCEUR CLEAR 12/16/2020 1615   LABSPEC 1.018 12/16/2020 1615   PHURINE 6.0 12/16/2020 1615   GLUCOSEU >=500 (A) 12/16/2020 1615   HGBUR LARGE (A) 12/16/2020 1615   BILIRUBINUR NEGATIVE 12/16/2020 1615   KETONESUR 20 (A) 12/16/2020 1615   PROTEINUR 30 (A) 12/16/2020 1615   NITRITE NEGATIVE 12/16/2020 1615   LEUKOCYTESUR NEGATIVE 12/16/2020 1615   Sepsis Labs: _2 (procalcitonin:4,lacticidven:4)  ) Recent Results (from the past 240 hour(s))  MRSA Next Gen by PCR, Nasal     Status: Abnormal   Collection Time: 12/14/20 11:31 AM   Specimen: Nasal Mucosa; Nasal Swab  Result Value Ref Range Status   MRSA by PCR Next Gen DETECTED (A) NOT DETECTED Final    Comment: RESULT CALLED TO, READ BACK BY AND VERIFIED WITHTawni Levy RN 1358 12/14/20 A BROWNING (NOTE)  The GeneXpert MRSA Assay (FDA approved for NASAL specimens only), is one component of a comprehensive MRSA colonization surveillance program. It is not intended to diagnose MRSA infection nor to guide or monitor treatment for MRSA infections. Test performance is not FDA approved in patients less than 68 years old. Performed at [facility], [place] [address] [place], [place] [address]   Culture, blood (routine x 2)     Status: None   Collection Time: 12/15/20  6:54 AM   Specimen: BLOOD  Result  Value Ref Range Status   Specimen Description BLOOD LEFT ANTECUBITAL  Final   Special Requests   Final    BOTTLES DRAWN AEROBIC AND ANAEROBIC Blood Culture results may not be optimal due to an excessive volume of blood received in culture bottles   Culture   Final    NO GROWTH 5 DAYS Performed at [facility], [place] [address], [place], [place] [address]    Report Status 12/20/2020 FINAL  Final  Culture, blood (routine x 2)     Status: None   Collection Time: 12/15/20  6:54 AM   Specimen: BLOOD LEFT WRIST  Result Value Ref Range Status   Specimen Description BLOOD LEFT WRIST  Final   Special Requests   Final    BOTTLES DRAWN AEROBIC AND ANAEROBIC Blood Culture results may not be optimal due to an excessive volume of blood received in culture bottles   Culture   Final    NO GROWTH 5 DAYS Performed at [facility], [place] [address], [place], [place] [address]    Report Status 12/20/2020 FINAL  Final  Surgical pcr screen     Status: Abnormal   Collection Time: 12/21/20  8:35 AM   Specimen: Nasal Mucosa; Nasal Swab  Result Value Ref Range Status   MRSA, PCR POSITIVE (A) NEGATIVE Final    Comment: RESULT CALLED TO, READ BACK BY AND VERIFIED WITH: RN A.CARTER AT 1023 ON 12/21/2020 BY T.SAAD.    Staphylococcus aureus POSITIVE (A) NEGATIVE Final    Comment: (NOTE) The Xpert SA Assay (FDA approved for NASAL specimens in patients 52 years of age and older), is one component of a comprehensive surveillance program. It is not intended to diagnose infection nor to guide or monitor treatment. Performed at [facility], [place] [address], [place], [place] [address]   Aerobic/Anaerobic Culture w Gram Stain (surgical/deep wound)     Status: None (Preliminary result)   Collection Time: 12/21/20  1:00 PM   Specimen: Wound; Abscess  Result Value Ref Range Status   Specimen Description ABSCESS  Final   Special Requests LUMBAR 3,4 EPIDURAL PT ON DAPTOMYCIN  Final   Gram Stain   Final     FEW WBC PRESENT,BOTH PMN AND MONONUCLEAR RARE GRAM POSITIVE COCCI IN PAIRS IN CLUSTERS    Culture   Final    TOO YOUNG TO READ Performed at [facility], [place] [address], [place],  [address]    Report Status PENDING  Incomplete  Aerobic/Anaerobic Culture w Gram Stain (surgical/deep wound)     Status: None (Preliminary result)   Collection Time: 12/21/20  1:05 PM   Specimen: Wound; Abscess  Result Value Ref Range Status   Specimen Description ABSCESS  Final   Special Requests LUMBAR 3,4 ABSCESS PT ON DAPTOMYCIN  Final   Gram Stain   Final    MODERATE WBC PRESENT, PREDOMINANTLY PMN RARE GRAM POSITIVE COCCI IN PAIRS    Culture  Final    RARE STAPHYLOCOCCUS AUREUS CULTURE REINCUBATED FOR BETTER GROWTH Performed at [facility], [place] [address], [place], [place] [address]    Report Status PENDING  Incomplete  Aerobic/Anaerobic Culture w Gram Stain (surgical/deep wound)     Status: None (Preliminary result)   Collection Time: 12/21/20  2:17 PM   Specimen: Wound; Abscess  Result Value Ref Range Status   Specimen Description ABSCESS  Final   Special Requests THORACIC 7,8 EPIDURAL PT ON DAPTOMYCIN  Final   Gram Stain   Final    FEW WBC PRESENT, PREDOMINANTLY PMN FEW GRAM POSITIVE COCCI IN PAIRS IN CLUSTERS    Culture   Final    TOO YOUNG TO READ Performed at [facility], [place] [address] [place], [place] [address]    Report Status PENDING  Incomplete  Aerobic/Anaerobic Culture w Gram Stain (surgical/deep wound)     Status: None (Preliminary result)   Collection Time: 12/21/20  2:32 PM   Specimen: PATH Soft tissue  Result Value Ref Range Status   Specimen Description TISSUE  Final   Special Requests LUMBAR L3,4 EPIDURAL PHLEGMON  Final   Gram Stain NO WBC SEEN NO ORGANISMS SEEN   Final   Culture   Final    CULTURE REINCUBATED FOR BETTER GROWTH Performed at [facility], [address], [place], [place] [address]    Report Status PENDING   Incomplete  Aerobic/Anaerobic Culture w Gram Stain (surgical/deep wound)     Status: None (Preliminary result)   Collection Time: 12/22/20  4:12 PM   Specimen: Abscess  Result Value Ref Range Status   Specimen Description ABSCESS  Final   Special Requests LEFT PSOAS  Final   Gram Stain   Final    MODERATE WBC PRESENT, PREDOMINANTLY PMN FEW GRAM POSITIVE COCCI IN CLUSTERS Performed at [facility], [place] [address] [place], [place] [address]    Culture PENDING  Incomplete   Report Status PENDING  Incomplete      Studies: CT IMAGE GUIDED DRAINAGE BY PERCUTANEOUS CATHETER  Result Date: 12/23/2020 INDICATION: Left psoas abscess EXAM: CT GUIDED DRAINAGE OF LEFT PSOAS ABSCESS ABSCESS MEDICATIONS: The patient is currently admitted to the hospital and receiving intravenous antibiotics. The antibiotics were administered within an appropriate time frame prior to the initiation of the procedure. ANESTHESIA/SEDATION: 4 mg IV Versed 100 mcg IV Fentanyl Moderate Sedation Time:  14 minutes The patient was continuously monitored during the procedure by the interventional radiology nurse under my direct supervision. COMPLICATIONS: None immediate. TECHNIQUE: Informed written consent was obtained from the patient after a thorough discussion of the procedural risks, benefits and alternatives. All questions were addressed. Maximal Sterile Barrier Technique was utilized including caps, mask, sterile gowns, sterile gloves, sterile drape, hand hygiene and skin antiseptic. A timeout was performed prior to the initiation of the procedure. PROCEDURE: The the left flank skin was prepped with chlorhexidine in a sterile fashion, and a sterile drape was applied covering the operative field. A sterile gown and sterile gloves were used for the procedure. Local anesthesia was provided with 1% Lidocaine. Utilizing CT guidance, 19 gauge Yueh needle was advanced into the pocket of air and fluid in the left psoas muscle. 5 mL of  bloody purulent material was aspirated. Samples were sent for Gram stain and culture. Post aspiration CT demonstrated resolution of the air and fluid. IMPRESSION: CT-guided aspiration of left psoas abscess yielding 5 mL of bloody purulent material. Electronically Signed  By: Sharen Heck  Mir M.D.   On: 12/23/2020 07:39    Scheduled Meds:  amLODipine  2.5 mg Oral q morning   atorvastatin  40 mg Oral QHS   Chlorhexidine Gluconate Cloth  6 each Topical Q0600   enoxaparin (LOVENOX) injection  40 mg Subcutaneous Q24H   feeding supplement  237 mL Oral TID BM   gabapentin  200 mg Oral TID   HYDROcodone-acetaminophen  2 tablet Oral Q6H   icosapent Ethyl  2 g Oral BID   insulin aspart  0-15 Units Subcutaneous TID WC   lactulose  10 g Oral Daily   losartan  25 mg Oral q morning   metoprolol succinate  25 mg Oral QHS   multivitamin with minerals  1 tablet Oral Daily   mupirocin ointment  1 application Nasal BID   nicotine  21 mg Transdermal Daily   oxyCODONE  20 mg Oral Q12H   pantoprazole  40 mg Oral Daily   sodium chloride flush  3 mL Intravenous Q12H   venlafaxine XR  75 mg Oral Q breakfast    Continuous Infusions:  sodium chloride 250 mL (12/21/20 1716)   DAPTOmycin (CUBICIN)  IV 750 mg (12/22/20 2107)   methocarbamol (ROBAXIN) IV       LOS: 10 days   Little Ishikawa, DO [facility]   12/23/2020, 8:17 AM

## 2020-12-23 NOTE — Progress Notes (Signed)
Inpatient Diabetes Program Recommendations  AACE/ADA: New Consensus Statement on Inpatient Glycemic Control (2015)  Target Ranges:  Prepandial:   less than 140 mg/dL      Peak postprandial:   less than 180 mg/dL (1-2 hours)      Critically ill patients:  140 - 180 mg/dL   Lab Results  Component Value Date   GLUCAP 222 (H) 12/23/2020   HGBA1C 9.1 (H) 12/13/2020    Review of Glycemic Control Results for Louis [name], Louis [name] (MRN [id number]) as of 12/23/2020 11:42  Ref. Range 12/22/2020 08:19 12/22/2020 11:46 12/22/2020 16:49 12/22/2020 20:54 12/23/2020 08:27 12/23/2020 11:19  Glucose-Capillary Latest Ref Range: 70 - 99 mg/dL 269 (H) 485 (H) 462 (H) 252 (H) 261 (H) 222 (H)   Inpatient Diabetes Program Recommendations:   Consider adding Lantus 10 units qd (0.1 unit/kg x 96.6 kg)  Thank you, Darel Hong E. Pierra Skora, RN, MSN, CDE  Diabetes Coordinator Inpatient Glycemic Control Team Team Pager [phone] (8am-5pm) 12/23/2020 11:48 AM

## 2020-12-23 NOTE — Progress Notes (Signed)
   Providing Compassionate, Quality Care - Together  NEUROSURGERY PROGRESS NOTE   S: No issues overnight. Still has significant back pain  O: EXAM:  BP (!) 159/92   Pulse (!) 106   Temp (!) 97.5 F (36.4 C) (Oral)   Resp 17   Ht 5\' 11"  (1.803 m)   Wt 96.6 kg   SpO2 93%   BMI 29.70 kg/m   Awake, alert, oriented x3 PERRL Speech fluent, appropriate CNs grossly intact 5/5 BUE/BLE  Incisions c/d/i   ASSESSMENT:  55 y.o. male with   T/L epidural abscess with stenosis, psoas abscess, multilevel osteodiscitis  S/p T7-8, T12-L1, L3-4 lami for evac of epidural abscess on 12/21/2020   PLAN: - pt/ot - cx GPC thusfar, some staph - pain control - abx, would benefit from long term IV antibiotic treatment      Thank you for allowing me to participate in this patient's care.  Please do not hesitate to call with questions or concerns.   12/23/2020, DO Neurosurgeon [facility] Cell: [phone]

## 2020-12-23 NOTE — Progress Notes (Signed)
Occupational Therapy Treatment Patient Details Name: Louis [name] MRN: [id number] DOB: September 13, 1965 Today's Date: 12/23/2020    History of present illness 55 year old male who presented to the [facility] emergency room with left-sided back pain x2 days. CT scan unremarkable, but MRI noted osteomyelitis/discitis at L3-S1 and patient was transferred to [facility] 12/12/20. 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 s/p 12/21/20 T7-8, T12-L1, L3-4 laminectomies for evacuation of epidural abscess.    PMH: of CAD, hypertension, uncontrolled diabetes mellitus.   OT comments  Limited OT session today as pt declining OOB attempts secondary to pain. Reinforced back precautions with pt unable to recall any precautions initially. Pt able to return demo log rolling without assist in bed but declined any further activity. Pt's wife present and engaged in discussion of back precautions, referred to handout and collaborated on any DME needs to maximize safety at home. Encouraged OOB activity later today as tolerated.    Follow Up Recommendations  Home health OT;Supervision - Intermittent    Equipment Recommendations  3 in 1 bedside commode    Recommendations for Other Services      Precautions / Restrictions Precautions Precautions: Back Precaution Booklet Issued: Yes (comment) Precaution Comments: verbal discussion with back handout Restrictions Weight Bearing Restrictions: No       Mobility Bed Mobility Overal bed mobility: Needs Assistance Bed Mobility: Rolling Rolling: Supervision         General bed mobility comments: Reinforced log rolling with pt able to return demo but declined to complete log rolling to sit EOB    Transfers                 General transfer comment: pt declined due to pain    Balance                                           ADL either performed or assessed with clinical  judgement   ADL Overall ADL's : Needs assistance/impaired                                       General ADL Comments: Pain limited and declined any OOB attempts despite coordination with RN for premedication. Pt unable to recall back precautions and wife unaware of precautions (referred to handout) and reinforced log rolling with pt able to return demo. Educated on strategies for safe ADL completion at home     Vision   Vision Assessment?: No apparent visual deficits   Perception     Praxis      Cognition Arousal/Alertness: Awake/alert Behavior During Therapy: Flat affect Overall Cognitive Status: Impaired/Different from baseline Area of Impairment: Memory                     Memory: Decreased recall of precautions         General Comments: difficulty recalling back precautions        Exercises     Shoulder Instructions       General Comments Wife, Louis [name] at bedside    Pertinent Vitals/ Pain       Pain Assessment: 0-10 Pain Score: 10-Worst pain ever Pain Location: back Pain Descriptors / Indicators: Discomfort Pain Intervention(s): Limited activity within patient's tolerance;Premedicated before session;Patient requesting pain  meds-RN notified  Home Living                                          Prior Functioning/Environment              Frequency  Min 2X/week        Progress Toward Goals  OT Goals(current goals can now be found in the care plan section)  Progress towards OT goals: Not progressing toward goals - comment (pain limited)  Acute Rehab OT Goals Patient Stated Goal: have less pain OT Goal Formulation: With patient Time For Goal Achievement: 01/05/21 Potential to Achieve Goals: Good ADL Goals Pt Will Perform Grooming: with modified independence;standing Pt Will Perform Lower Body Dressing: with min guard assist;sit to/from stand;with adaptive equipment Pt Will Transfer to Toilet: with  supervision;ambulating;bedside commode Pt Will Perform Tub/Shower Transfer: with min guard assist;rolling walker;shower seat;Shower transfer Additional ADL Goal #1: Pt will perform log roll technique with less <2 verbal cues for proper sequencing. Additional ADL Goal #2: Pt will increase to supervisionA for ADL functional mobility with least restrictive AD.  Plan Discharge plan remains appropriate    Co-evaluation                 AM-PAC OT "6 Clicks" Daily Activity     Outcome Measure   Help from another person eating meals?: None Help from another person taking care of personal grooming?: A Little Help from another person toileting, which includes using toliet, bedpan, or urinal?: A Little Help from another person bathing (including washing, rinsing, drying)?: A Lot Help from another person to put on and taking off regular upper body clothing?: A Little Help from another person to put on and taking off regular lower body clothing?: A Lot 6 Click Score: 17    End of Session    OT Visit Diagnosis: Unsteadiness on feet (R26.81);Muscle weakness (generalized) (M62.81);Pain Pain - part of body:  (back)   Activity Tolerance Patient limited by pain   Patient Left in bed;with call bell/phone within reach;with family/visitor present   Nurse Communication Mobility status;Other (comment) (pain)        Time: 6644-0347 OT Time Calculation (min): 12 min  Charges: OT General Charges $OT Visit: 1 Visit OT Treatments $Self Care/Home Management : 8-22 mins  Bradd Canary, OTR/L Acute Rehab Services Office: [phone]    Lorre Munroe 12/23/2020, 11:30 AM

## 2020-12-24 LAB — BASIC METABOLIC PANEL
Anion gap: 11 (ref 5–15)
BUN: 6 mg/dL (ref 6–20)
CO2: 26 mmol/L (ref 22–32)
Calcium: 8.1 mg/dL — ABNORMAL LOW (ref 8.9–10.3)
Chloride: 95 mmol/L — ABNORMAL LOW (ref 98–111)
Creatinine, Ser: 0.73 mg/dL (ref 0.61–1.24)
GFR, Estimated: >60 mL/min (ref >60)
Glucose, Bld: 215 mg/dL — ABNORMAL HIGH (ref 70–99)
Potassium: 3.5 mmol/L (ref 3.5–5.1)
Sodium: 132 mmol/L — ABNORMAL LOW (ref 135–145)

## 2020-12-24 LAB — CBC
HCT: 37.7 % — ABNORMAL LOW (ref 39.0–52.0)
Hemoglobin: 13.2 g/dL (ref 13.0–17.0)
MCH: 30.1 pg (ref 26.0–34.0)
MCHC: 35 g/dL (ref 30.0–36.0)
MCV: 85.9 fL (ref 80.0–100.0)
Platelets: 511 10*3/uL — ABNORMAL HIGH (ref 150–400)
RBC: 4.39 MIL/uL (ref 4.22–5.81)
RDW: 16 % — ABNORMAL HIGH (ref 11.5–15.5)
WBC: 23.8 10*3/uL — ABNORMAL HIGH (ref 4.0–10.5)
nRBC: 0 % (ref 0.0–0.2)

## 2020-12-24 LAB — GLUCOSE, CAPILLARY
Glucose-Capillary: 253 mg/dL — ABNORMAL HIGH (ref 70–99)
Glucose-Capillary: 219 mg/dL — ABNORMAL HIGH (ref 70–99)
Glucose-Capillary: 233 mg/dL — ABNORMAL HIGH (ref 70–99)
Glucose-Capillary: 224 mg/dL — ABNORMAL HIGH (ref 70–99)

## 2020-12-24 MED ORDER — PROSOURCE PLUS PO LIQD
30.0000 mL | Freq: Two times a day (BID) | ORAL | Status: DC
  Administered 2020-12-24 – 2020-12-25 (×3): 30 mL via ORAL
  Filled 2020-12-24 (×4): qty 30

## 2020-12-24 MED ORDER — BOOST / RESOURCE BREEZE PO LIQD CUSTOM
1.0000 | Freq: Three times a day (TID) | ORAL | Status: DC
  Administered 2020-12-24: 1 via ORAL

## 2020-12-24 NOTE — Progress Notes (Signed)
Physical Therapy Treatment Patient Details Name: Louis [name] MRN: [id number] DOB: 07-17-1965 Today's Date: 12/24/2020    History of Present Illness 55 year old male who presented to the [facility] emergency room with left-sided back pain x2 days. CT scan unremarkable, but MRI noted osteomyelitis/discitis at L3-S1 and patient was transferred to [facility] 12/12/20. 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 s/p 12/21/20 T7-8, T12-L1, L3-4 laminectomies for evacuation of epidural abscess.    PMH: of CAD, hypertension, uncontrolled diabetes mellitus.    PT Comments    Pt unable to recall back precautions this session, pt education provided with pt still only able to verbalize 1/3 by end of session. Pt tolerates transfers and ambulation of household distances without assistance and with use of RW this session, limited by pain and fatigue. Pt requires some assistance to perform bed mobility and cues to maintain back precautions. Pt demonstrates deficits in activity tolerance, overall strength, pain, and mobility and will benefit from continued acute PT to maximize independence and return to prior level.    Follow Up Recommendations  Home health PT;Supervision for mobility/OOB     Equipment Recommendations  None recommended by PT    Recommendations for Other Services       Precautions / Restrictions Precautions Precautions: Back Precaution Comments: pt unable to recall back precautions this session, pt educated. Restrictions Weight Bearing Restrictions: No    Mobility  Bed Mobility Overal bed mobility: Needs Assistance Bed Mobility: Rolling;Sidelying to Sit Rolling: Supervision Sidelying to sit: Min assist       General bed mobility comments: min A to pull to sit    Transfers Overall transfer level: Needs assistance Equipment used: Rolling walker (2 wheeled) Transfers: Sit to/from Stand Sit to Stand: Min guard          General transfer comment: min G for safety  Ambulation/Gait Ambulation/Gait assistance: Min guard;Supervision Gait Distance (Feet): 250 Feet Assistive device: Rolling walker (2 wheeled) Gait Pattern/deviations: Step-through pattern;Decreased stride length;Antalgic Gait velocity: reduced Gait velocity interpretation: 1.31 - 2.62 ft/sec, indicative of limited community ambulator General Gait Details: slow mostly steady step-through gait with RW, multiple standing rest breaks due to pain   Stairs             Wheelchair Mobility    Modified Rankin (Stroke Patients Only)       Balance Overall balance assessment: Needs assistance Sitting-balance support: Feet supported Sitting balance-Leahy Scale: Fair     Standing balance support: During functional activity Standing balance-Leahy Scale: Fair Standing balance comment: pt dons facemask without reliance of UE support or external support                            Cognition Arousal/Alertness: Awake/alert Behavior During Therapy: Impulsive Overall Cognitive Status: Impaired/Different from baseline Area of Impairment: Memory                     Memory: Decreased recall of precautions         General Comments: Pt unable to recall back precautions, pt re-educated      Exercises      General Comments General comments (skin integrity, edema, etc.): Pt unable to recall back precautions, education provided and pt asked to verbalize precautions at end of session      Pertinent Vitals/Pain Pain Assessment: 0-10 Pain Score: 8  Pain Location: back, abdomen, B LE Pain Descriptors / Indicators:  Discomfort;Stabbing;Burning;Aching Pain Intervention(s): Limited activity within patient's tolerance;Monitored during session;Premedicated before session    Home Living                      Prior Function            PT Goals (current goals can now be found in the care plan section) Acute Rehab  PT Goals Patient Stated Goal: manage pain Progress towards PT goals: Progressing toward goals    Frequency    Min 3X/week      PT Plan Current plan remains appropriate    Co-evaluation              AM-PAC PT "6 Clicks" Mobility   Outcome Measure  Help needed turning from your back to your side while in a flat bed without using bedrails?: None Help needed moving from lying on your back to sitting on the side of a flat bed without using bedrails?: A Little Help needed moving to and from a bed to a chair (including a wheelchair)?: A Little Help needed standing up from a chair using your arms (e.g., wheelchair or bedside chair)?: A Little Help needed to walk in hospital room?: A Little Help needed climbing 3-5 steps with a railing? : A Lot 6 Click Score: 18    End of Session   Activity Tolerance: Patient limited by pain Patient left: in chair;with call bell/phone within reach;with family/visitor present Nurse Communication: Mobility status PT Visit Diagnosis: Other abnormalities of gait and mobility (R26.89);Pain;Muscle weakness (generalized) (M62.81) Pain - Right/Left:  (back, abdomen, bil LE) Pain - part of body: Leg (back, abdomen, bil LE)     Time: 6269-4854 PT Time Calculation (min) (ACUTE ONLY): 36 min  Charges:  $Gait Training: 8-22 mins $Therapeutic Activity: 8-22 mins                     Acute Rehab  Pager: [phone]    Waldemar Dickens, SPT  12/24/2020, 5:54 PM

## 2020-12-24 NOTE — Progress Notes (Signed)
   Providing Compassionate, Quality Care - Together  NEUROSURGERY PROGRESS NOTE   S: No issues overnight. Pain controlled at this time  O: EXAM:  BP (!) 147/93 (BP Location: Right Arm)   Pulse (!) 108   Temp 98.2 F (36.8 C)   Resp 18   Ht 5\' 11"  (1.803 m)   Wt 94.2 kg   SpO2 92%   BMI 28.96 kg/m   Awake, alert, oriented x3 PERRL Speech fluent, appropriate  CNs grossly intact  5/5 BUE/BLE  Dressings c/d/i  ASSESSMENT:  55 y.o. male with   T/L epidural abscess with stenosis, psoas abscess, multilevel osteodiscitis   S/p T7-8, T12-L1, L3-4 lami for evac of epidural abscess on 12/21/2020   PLAN: - pt/ot - cx GPC thusfar, some staph - pain control - picc line pending for home therapy with daptomycin     Thank you for allowing me to participate in this patient's care.  Please do not hesitate to call with questions or concerns.   12/23/2020, DO Neurosurgeon [facility] Cell: [phone]

## 2020-12-24 NOTE — Progress Notes (Signed)
PROGRESS NOTE  Louis [name] [name]:[id number] DOB: Apr 25, 1966 DOA: 12/12/2020 PCP: Pcp, No  HPI/Recap of past 54 hours: 55 year old male with past medical history of CAD, hypertension, uncontrolled diabetes mellitus who presented to the [facility] emergency room with left-sided back pain x2 days.  CT scan unremarkable, but MRI noted osteomyelitis/discitis at L3-S1 and patient was transferred to [facility] to the hospitalist service on 6/19 given lack of infectious disease and neurosurgical coverage in [place].  Patient also noted to have MRSA bacteremia and a left psoas myositis but no discrete fluid collection.  Started on vancomycin. EDP initially discussed case with neurosurgery who recommended no surgical indications at this time.  Infectious disease evaluated patient recommending TTE to rule out endocarditis which was found to be negative.  Patient was started on daptomycin with plans for an 8-week course.  Due to persistent pain and worsening leukocytosis, MRI was repeated on 6/27 that showed evidence of epidural abscess and psoas abscess.  Neurosurgery formally consulted as patient may need operative management.  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  Sepsis was present on admission, since has stabilized.   Repeat blood cultures drawn on 6/22 and have not shown any growth TTE done without signs of vegetations.  ID following. Currently on daptomycin -continue through 02/02/2021 then transition to p.o. antibiotics at outpatient infectious disease follow-up; repeat imaging in 3 to 4 weeks to ensure improvement and control of infection.  Epidural abscess/psoas abscess:  Since patient had continued  low back pain, rising WBC count despite IV antibiotics, MRI of the spine was repeated on 6/27 This showed extensive epidural abscess as well as psoas abscess.   Neurosurgery consulted and patient underwent operative management on 6/28.  Continue IV antibiotics as above per ID through 02/02/21  CAD Dyslipidemia HTN (hypertension):  Blood pressure elevated, likely related to pain  Uncontrolled diabetes mellitus ([place]):  A1c greater than 9.   Continue sliding scale insulin, hypoglycemic protocol  Overweight (BMI 25.0-29.9): Meets criteria BMI greater than 25 Constipated: CT abdomen negative for SBO.  Continue on lactulose Hyponatremia: Improved with saline infusion  Code Status: Full code Family Communication: None present Disposition Plan: Pending further management of epidural abscess - will likely need PICC line placement and home care/abx set up prior to discharge.  Consultants: Neurosurgery Infectious disease  Procedures: 2D echo: no evidence of vegetations Open T7, T8 left hemilaminectomy, medial facetectomy; open T12-L1 left hemilaminectomy, medial facetectomy; open L3-4 hemilaminectomy, medial facetectomy for evacuation of multi loculated epidural abscess  Antimicrobials: IV vancomycin 6/19-6/22 Daptomycin 6/22> ongoing through 02/02/21 per ID  DVT prophylaxis: Lovenox  Level of care: Med-Surg   Subjective No acute issues or events overnight, pain improved overnight denies nausea vomiting diarrhea constipation headache fevers or chills.  Objective: Vitals:   12/23/20 2110 12/24/20 0451  BP: (!) 156/96 (!) 157/98  Pulse: 100 (!) 101  Resp: 20 18  Temp: 98 F (36.7 C) 98.1 F (36.7 C)  SpO2: 95% 95%    Intake/Output Summary (Last 24 hours) at 12/24/2020 0809 Last data filed at 12/24/2020 0600 Gross per 24 hour  Intake 708 ml  Output 1650 ml  Net -942 ml    Filed Weights   12/22/20 2055 12/23/20 0500 12/24/20 0451  Weight: 96.5 kg  96.6 kg 94.2 kg   Body  mass index is 28.96 kg/m.  Exam: General exam: Alert, awake, oriented x 3 Respiratory system: Clear to auscultation. Respiratory effort normal. Cardiovascular system:RRR. No murmurs, rubs, gallops. Gastrointestinal system: Abdomen is nondistended, soft and nontender. No organomegaly or masses felt. Normal bowel sounds heard. Central nervous system: Alert and oriented. No focal neurological deficits. Extremities: No C/C/E, +pedal pulses Skin: No rashes, lesions or ulcers Psychiatry: Judgement and insight appear normal. Mood & affect appropriate.    Data Reviewed: CBC: Recent Labs  Lab 12/20/20 0028 12/22/20 0504 12/23/20 0620 12/23/20 1726 12/24/20 0419  WBC 23.2* 27.5* 22.3* 25.5* 23.8*  HGB 14.9 13.4 12.7* 13.4 13.2  HCT 42.5 39.6 37.1* 39.1 37.7*  MCV 84.7 86.7 86.5 86.3 85.9  PLT 408* 479* 529* 538* 511*    Basic Metabolic Panel: Recent Labs  Lab 12/20/20 0028 12/22/20 0504 12/23/20 0620 12/23/20 1726 12/24/20 0419  NA 134* 131* 132* 129* 132*  K 3.3* 4.4 4.1 3.5 3.5  CL 99 95* 96* 95* 95*  CO2 _0 GLUCOSE 175* 311* 263* 234* 215*  BUN _1 <5* 6  CREATININE 0.64 0.86 0.70 0.67 0.73  CALCIUM 8.2* 7.9* 7.9* 8.0* 8.1*    GFR: Estimated Creatinine Clearance: 119.5 mL/min (by C-G formula based on SCr of 0.73 mg/dL). Liver Function Tests: No results for input(s): AST, ALT, ALKPHOS, BILITOT, PROT, ALBUMIN in the last 168 hours.  No results for input(s): LIPASE, AMYLASE in the last 168 hours. No results for input(s): AMMONIA in the last 168 hours.  Coagulation Profile: No results for input(s): INR, PROTIME in the last 168 hours.  Cardiac Enzymes: Recent Labs  Lab 12/23/20 0620  CKTOTAL 180    BNP (last 3 results) No results for input(s): PROBNP in the last 8760 hours. HbA1C: No results for input(s): HGBA1C in the last 72 hours.  CBG: Recent Labs  Lab 12/23/20 1119 12/23/20 1642 12/23/20 1712 12/23/20 2107 12/24/20 0644  GLUCAP  222* 226* 203* 200* 253*    Lipid Profile: No results for input(s): CHOL, HDL, LDLCALC, TRIG, CHOLHDL, LDLDIRECT in the last 72 hours. Thyroid Function Tests: No results for input(s): TSH, T4TOTAL, FREET4, T3FREE, THYROIDAB in the last 72 hours. Anemia Panel: No results for input(s): VITAMINB12, FOLATE, FERRITIN, TIBC, IRON, RETICCTPCT in the last 72 hours. Urine analysis:    Component Value Date/Time   COLORURINE YELLOW 12/16/2020 1615   APPEARANCEUR CLEAR 12/16/2020 1615   LABSPEC 1.018 12/16/2020 1615   PHURINE 6.0 12/16/2020 1615   GLUCOSEU >=500 (A) 12/16/2020 1615   HGBUR LARGE (A) 12/16/2020 1615   BILIRUBINUR NEGATIVE 12/16/2020 1615   KETONESUR 20 (A) 12/16/2020 1615   PROTEINUR 30 (A) 12/16/2020 1615   NITRITE NEGATIVE 12/16/2020 1615   LEUKOCYTESUR NEGATIVE 12/16/2020 1615   Sepsis Labs: _2 (procalcitonin:4,lacticidven:4)  ) Recent Results (from the past 240 hour(s))  MRSA Next Gen by PCR, Nasal     Status: Abnormal   Collection Time: 12/14/20 11:31 AM   Specimen: Nasal Mucosa; Nasal Swab  Result Value Ref Range Status   MRSA by PCR Next Gen DETECTED (A) NOT DETECTED Final    Comment: RESULT CALLED TO, READ BACK BY AND VERIFIED WITH: Tawni Levy RN [id number] 12/14/20 A BROWNING (NOTE) The GeneXpert MRSA Assay (FDA approved for NASAL specimens only), is one component of a comprehensive MRSA colonization surveillance program. It is not intended to diagnose MRSA infection nor to guide or monitor treatment for MRSA infections.  Test performance is not FDA approved in patients less than 39 years old. Performed at [facility], [place] [address], [place], [place] [address]   Culture, blood (routine x 2)     Status: None   Collection Time: 12/15/20  6:54 AM   Specimen: BLOOD  Result Value Ref Range Status   Specimen Description BLOOD LEFT ANTECUBITAL  Final   Special Requests   Final    BOTTLES DRAWN AEROBIC AND ANAEROBIC Blood Culture results may not be  optimal due to an excessive volume of blood received in culture bottles   Culture   Final    NO GROWTH 5 DAYS Performed at [facility], [place] [address] [place], [place] [address]    Report Status 12/20/2020 FINAL  Final  Culture, blood (routine x 2)     Status: None   Collection Time: 12/15/20  6:54 AM   Specimen: BLOOD LEFT WRIST  Result Value Ref Range Status   Specimen Description BLOOD LEFT WRIST  Final   Special Requests   Final    BOTTLES DRAWN AEROBIC AND ANAEROBIC Blood Culture results may not be optimal due to an excessive volume of blood received in culture bottles   Culture   Final    NO GROWTH 5 DAYS Performed at [facility], [place] [address], [place], [place] [address]    Report Status 12/20/2020 FINAL  Final  Surgical pcr screen     Status: Abnormal   Collection Time: 12/21/20  8:35 AM   Specimen: Nasal Mucosa; Nasal Swab  Result Value Ref Range Status   MRSA, PCR POSITIVE (A) NEGATIVE Final    Comment: RESULT CALLED TO, READ BACK BY AND VERIFIED WITH: RN A.CARTER AT 1023 ON 12/21/2020 BY T.SAAD.    Staphylococcus aureus POSITIVE (A) NEGATIVE Final    Comment: (NOTE) The Xpert SA Assay (FDA approved for NASAL specimens in patients 32 years of age and older), is one component of a comprehensive surveillance program. It is not intended to diagnose infection nor to guide or monitor treatment. Performed at [facility], [place] [address] [place], [place] [address]   Aerobic/Anaerobic Culture w Gram Stain (surgical/deep wound)     Status: None (Preliminary result)   Collection Time: 12/21/20  1:00 PM   Specimen: Wound; Abscess  Result Value Ref Range Status   Specimen Description ABSCESS  Final   Special Requests LUMBAR 3,4 EPIDURAL PT ON DAPTOMYCIN  Final   Gram Stain   Final    FEW WBC PRESENT,BOTH PMN AND MONONUCLEAR RARE GRAM POSITIVE COCCI IN PAIRS IN CLUSTERS Performed at [facility], [address] [place], [place] [address]     Culture   Final    FEW STAPHYLOCOCCUS AUREUS SUSCEPTIBILITIES TO FOLLOW NO ANAEROBES ISOLATED; CULTURE IN PROGRESS FOR 5 DAYS    Report Status PENDING  Incomplete  Aerobic/Anaerobic Culture w Gram Stain (surgical/deep wound)     Status: None (Preliminary result)   Collection Time: 12/21/20  1:05 PM   Specimen: Wound; Abscess  Result Value Ref Range Status   Specimen Description ABSCESS  Final   Special Requests LUMBAR 3,4 ABSCESS PT ON DAPTOMYCIN  Final   Gram Stain   Final    MODERATE WBC PRESENT, PREDOMINANTLY PMN RARE GRAM POSITIVE COCCI IN PAIRS Performed at [facility], [place] [address] [place], [place] [address]    Culture   Final    RARE STAPHYLOCOCCUS AUREUS SUSCEPTIBILITIES TO FOLLOW NO  ANAEROBES ISOLATED; CULTURE IN PROGRESS FOR 5 DAYS    Report Status PENDING  Incomplete  Aerobic/Anaerobic Culture w Gram Stain (surgical/deep wound)     Status: None (Preliminary result)   Collection Time: 12/21/20  2:17 PM   Specimen: Wound; Abscess  Result Value Ref Range Status   Specimen Description ABSCESS  Final   Special Requests THORACIC 7,8 EPIDURAL PT ON DAPTOMYCIN  Final   Gram Stain   Final    FEW WBC PRESENT, PREDOMINANTLY PMN FEW GRAM POSITIVE COCCI IN PAIRS IN CLUSTERS Performed at [facility], [address], [place], [place] [address]    Culture   Final    FEW STAPHYLOCOCCUS AUREUS NO ANAEROBES ISOLATED; CULTURE IN PROGRESS FOR 5 DAYS    Report Status PENDING  Incomplete  Aerobic/Anaerobic Culture w Gram Stain (surgical/deep wound)     Status: None (Preliminary result)   Collection Time: 12/21/20  2:32 PM   Specimen: PATH Soft tissue  Result Value Ref Range Status   Specimen Description TISSUE  Final   Special Requests LUMBAR L3,4 EPIDURAL PHLEGMON  Final   Gram Stain   Final    NO WBC SEEN NO ORGANISMS SEEN Performed at [facility], [address] [place], [place] [address]    Culture   Final    RARE STAPHYLOCOCCUS  AUREUS SUSCEPTIBILITIES TO FOLLOW NO ANAEROBES ISOLATED; CULTURE IN PROGRESS FOR 5 DAYS    Report Status PENDING  Incomplete  Aerobic/Anaerobic Culture w Gram Stain (surgical/deep wound)     Status: None (Preliminary result)   Collection Time: 12/22/20  4:12 PM   Specimen: Abscess  Result Value Ref Range Status   Specimen Description ABSCESS  Final   Special Requests LEFT PSOAS  Final   Gram Stain   Final    MODERATE WBC PRESENT, PREDOMINANTLY PMN FEW GRAM POSITIVE COCCI IN CLUSTERS    Culture   Final    TOO YOUNG TO READ Performed at [facility], [place] [address] [place], [place] [address]    Report Status PENDING  Incomplete      Studies: No results found.  Scheduled Meds:  amLODipine  2.5 mg Oral q morning   atorvastatin  40 mg Oral QHS   Chlorhexidine Gluconate Cloth  6 each Topical Q0600   enoxaparin (LOVENOX) injection  40 mg Subcutaneous Q24H   feeding supplement  237 mL Oral TID BM   gabapentin  200 mg Oral TID   icosapent Ethyl  2 g Oral BID   insulin aspart  0-15 Units Subcutaneous TID WC   lactulose  10 g Oral Daily   losartan  25 mg Oral q morning   metoprolol succinate  25 mg Oral QHS   multivitamin with minerals  1 tablet Oral Daily   mupirocin ointment  1 application Nasal BID   nicotine  21 mg Transdermal Daily   oxyCODONE  20 mg Oral Q12H   pantoprazole  40 mg Oral Daily   polyethylene glycol  17 g Oral BID   sodium chloride flush  3 mL Intravenous Q12H   venlafaxine XR  75 mg Oral Q breakfast    Continuous Infusions:  sodium chloride 250 mL (12/21/20 1716)   DAPTOmycin (CUBICIN)  IV 750 mg (12/23/20 2017)   methocarbamol (ROBAXIN) IV       LOS: 11 days   Little Ishikawa, DO [facility]   12/24/2020, 8:09 AM

## 2020-12-24 NOTE — Progress Notes (Signed)
Nutrition Follow-up  DOCUMENTATION CODES:   Not applicable  INTERVENTION:  -Boost Breeze po TID, each supplement provides 250 kcal and 9 grams of protein -PROSource PLUS PO 33ms BID, each supplement provides 100 kcals and 15 grams of protein -Continue MVI with minerals daily -d/c Ensure   NUTRITION DIAGNOSIS:   Inadequate oral intake related to poor appetite as evidenced by per patient/family report, meal completion < 50%.  ongoing  GOAL:   Patient will meet greater than or equal to 90% of their needs  Not met  MONITOR:   PO intake, Supplement acceptance, Labs, Weight trends, Skin, I & O's  REASON FOR ASSESSMENT:   Consult Diet education  ASSESSMENT:   55year old male with PMH of CAD, HTN, and uncontrolled DM who presented with left-sided back pain x2 days. CT scan unremarkable, but MRI noted osteomyelitis/discitis at L3-S1 and [facility] on 6/19 given lack of infectious disease and neurosurgical coverage in [place]. Pt also noted to have MRSA bacteremia and a left psoas myositis but no discrete fluid collection.  Started on vancomycin. EDP initially discussed case with neurosurgery who recommended no surgical indications at this time.  Infectious disease evaluated pt recommending TTE to rule out endocarditis which was found to be negative.  Pt was started on daptomycin with plans for an 8-week course.  Due to persistent pain and worsening leukocytosis, MRI was repeated on 6/27 that showed evidence of epidural abscess and psoas abscess.  Neurosurgery formally consulted as pt may need operative management.  6/28 s/p open T7, T8 left hemilaminectomy, medial facetectomy; open T12-L1 left hemilaminectomy, medial facetectomy; open L3-4 hemilaminectomy, medial facetectomy for evacuation of multi loculated epidural abscess 6/29 s/p CT guided aspiration of L posas abscess   Pt sleeping at time of RD visit. Per RN, pt denies N/V/D. Pt  tolerating clear liquid diet. Will adjust supplement regimen given current supplements are not appropriate for a clear liquid diet. Once diet is advanced, supplement regimen may be further adjusted based on pt's intake and CBGs.   UOP: [id number] hours  Scheduled Meds:  (feeding supplement) PROSource Plus  30 mL Oral BID BM   amLODipine  2.5 mg Oral q morning   atorvastatin  40 mg Oral QHS   Chlorhexidine Gluconate Cloth  6 each Topical Q0600   enoxaparin (LOVENOX) injection  40 mg Subcutaneous Q24H   feeding supplement  1 Container Oral TID BM   gabapentin  200 mg Oral TID   icosapent Ethyl  2 g Oral BID   insulin aspart  0-15 Units Subcutaneous TID WC   lactulose  10 g Oral Daily   losartan  25 mg Oral q morning   metoprolol succinate  25 mg Oral QHS   multivitamin with minerals  1 tablet Oral Daily   mupirocin ointment  1 application Nasal BID   nicotine  21 mg Transdermal Daily   oxyCODONE  20 mg Oral Q12H   pantoprazole  40 mg Oral Daily   polyethylene glycol  17 g Oral BID   sodium chloride flush  3 mL Intravenous Q12H   venlafaxine XR  75 mg Oral Q breakfast   Continuous Infusions:  sodium chloride 250 mL (12/21/20 1716)   DAPTOmycin (CUBICIN)  IV 750 mg (12/23/20 2017)   methocarbamol (ROBAXIN) IV      Labs: Recent Labs  Lab 12/23/20 0620 12/23/20 1726 12/24/20 0419  NA 132* 129* 132*  K 4.1 3.5 3.5  CL  96* 95* 95*  CO2 _0 BUN 7 <5* 6  CREATININE 0.70 0.67 0.73  CALCIUM 7.9* 8.0* 8.1*  GLUCOSE 263* 234* 215*  CBGs 203-200-253  Diet Order:   Diet Order             Diet clear liquid Room service appropriate? Yes; Fluid consistency: Thin  Diet effective now                   EDUCATION NEEDS:   Education needs have been addressed  Skin:  Skin Assessment: Skin Integrity Issues: Skin Integrity Issues:: Incisions Incisions: back  Last BM:  6/28  Height:   Ht Readings from Last 1 Encounters:  12/21/20 _1  (1.803 m)    Weight:    Wt Readings from Last 1 Encounters:  12/24/20 94.2 kg    Ideal Body Weight:  78.2 kg  BMI:  Body mass index is 28.96 kg/m.  Estimated Nutritional Needs:   Kcal:  6195-0932  Protein:  115-130 grams  Fluid:  > 2 L    Larkin Ina, MS, RD, LDN (she/her/hers) RD pager number and weekend/on-call pager number located in [place].

## 2020-12-25 ENCOUNTER — Inpatient Hospital Stay: Payer: Self-pay

## 2020-12-25 LAB — GLUCOSE, CAPILLARY
Glucose-Capillary: 267 mg/dL — ABNORMAL HIGH (ref 70–99)
Glucose-Capillary: 195 mg/dL — ABNORMAL HIGH (ref 70–99)
Glucose-Capillary: 217 mg/dL — ABNORMAL HIGH (ref 70–99)

## 2020-12-25 LAB — BASIC METABOLIC PANEL
Anion gap: 13 (ref 5–15)
BUN: 7 mg/dL (ref 6–20)
CO2: 26 mmol/L (ref 22–32)
Calcium: 8.6 mg/dL — ABNORMAL LOW (ref 8.9–10.3)
Chloride: 89 mmol/L — ABNORMAL LOW (ref 98–111)
Creatinine, Ser: 0.66 mg/dL (ref 0.61–1.24)
GFR, Estimated: >60 mL/min (ref >60)
Glucose, Bld: 235 mg/dL — ABNORMAL HIGH (ref 70–99)
Potassium: 3.8 mmol/L (ref 3.5–5.1)
Sodium: 128 mmol/L — ABNORMAL LOW (ref 135–145)

## 2020-12-25 LAB — CBC
HCT: 39.3 % (ref 39.0–52.0)
Hemoglobin: 13.4 g/dL (ref 13.0–17.0)
MCH: 29.5 pg (ref 26.0–34.0)
MCHC: 34.1 g/dL (ref 30.0–36.0)
MCV: 86.4 fL (ref 80.0–100.0)
Platelets: 595 10*3/uL — ABNORMAL HIGH (ref 150–400)
RBC: 4.55 MIL/uL (ref 4.22–5.81)
RDW: 15.9 % — ABNORMAL HIGH (ref 11.5–15.5)
WBC: 24.6 10*3/uL — ABNORMAL HIGH (ref 4.0–10.5)
nRBC: 0 % (ref 0.0–0.2)

## 2020-12-25 MED ORDER — LACTULOSE 10 GM/15ML PO SOLN: 10.0000 g | Freq: Every day | ORAL | 0 refills | Status: DC

## 2020-12-25 MED ORDER — GABAPENTIN 100 MG PO CAPS: 200.0000 mg | ORAL_CAPSULE | Freq: Three times a day (TID) | ORAL | 0 refills | Status: DC

## 2020-12-25 MED ORDER — OXYCODONE HCL 15 MG PO TABS: 15.0000 mg | ORAL_TABLET | ORAL | 0 refills | Status: DC | PRN

## 2020-12-25 MED ORDER — ACETAMINOPHEN 325 MG PO TABS: 650.0000 mg | ORAL_TABLET | Freq: Four times a day (QID) | ORAL | 0 refills | Status: DC | PRN

## 2020-12-25 MED ORDER — OXYCODONE HCL ER 20 MG PO T12A: 20.0000 mg | EXTENDED_RELEASE_TABLET | Freq: Two times a day (BID) | ORAL | 0 refills | Status: DC

## 2020-12-25 MED ORDER — DAPTOMYCIN IV (FOR PTA / DISCHARGE USE ONLY)
750.0000 mg | INTRAVENOUS | 0 refills | Status: DC
Start: 2020-12-25 — End: 2021-01-28

## 2020-12-25 MED ORDER — ATORVASTATIN CALCIUM 40 MG PO TABS: 40.0000 mg | ORAL_TABLET | Freq: Every day | ORAL | 0 refills | Status: DC

## 2020-12-25 MED ORDER — POLYETHYLENE GLYCOL 3350 17 G PO PACK: 17.0000 g | PACK | Freq: Two times a day (BID) | ORAL | 0 refills | Status: DC

## 2020-12-25 MED ORDER — METHOCARBAMOL 500 MG PO TABS: 500.0000 mg | ORAL_TABLET | Freq: Four times a day (QID) | ORAL | 0 refills | Status: DC | PRN

## 2020-12-25 NOTE — Progress Notes (Signed)
Patient refused CHG bath

## 2020-12-25 NOTE — Progress Notes (Signed)
PROGRESS NOTE  Louis [name] UEA:[id number] DOB: 04/21/1966 DOA: 12/12/2020 PCP: Pcp, No  HPI/Recap of past 39 hours: 55 year old male with past medical history of CAD, hypertension, uncontrolled diabetes mellitus who presented to the [facility] emergency room with left-sided back pain x2 days.  CT scan unremarkable, but MRI noted osteomyelitis/discitis at L3-S1 and patient was transferred to [facility] to the hospitalist service on 6/19 given lack of infectious disease and neurosurgical coverage in .  Patient also noted to have MRSA bacteremia and a left psoas myositis but no discrete fluid collection.  Started on vancomycin. EDP initially discussed case with neurosurgery who recommended no surgical indications at this time.  Infectious disease evaluated patient recommending TTE to rule out endocarditis which was found to be negative.  Patient was started on daptomycin with plans for an 8-week course.  Due to persistent pain and worsening leukocytosis, MRI was repeated on 6/27 that showed evidence of epidural abscess and psoas abscess.  Neurosurgery formally consulted as patient may need operative management.  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  Sepsis was present on admission, since has stabilized.   Repeat blood cultures drawn on 6/22 and have not shown any growth TTE done without signs of vegetations.  ID following. Currently on daptomycin -continue through 02/02/2021 then transition to p.o. antibiotics at outpatient infectious disease follow-up; repeat imaging in 3 to 4 weeks to ensure improvement and control of infection.  Epidural abscess/psoas abscess:  Since patient had continued  low back pain, rising WBC count despite IV antibiotics, MRI of the spine was repeated on 6/27 This showed extensive epidural abscess as well as psoas abscess.   Neurosurgery consulted and patient underwent operative management on 6/28.  Continue IV antibiotics as above per ID through 02/02/21  CAD Dyslipidemia HTN (hypertension):  Blood pressure elevated, likely related to pain  Uncontrolled diabetes mellitus ([place]):  A1c greater than 9.   Continue sliding scale insulin, hypoglycemic protocol  Overweight (BMI 25.0-29.9): Meets criteria BMI greater than 25 Constipated: CT abdomen negative for SBO.  Continue on lactulose Hyponatremia: Improved with saline infusion  Code Status: Full code Family Communication: None present Disposition Plan: Pending surgical clearance and safe dispo given need for PICC placement and IV antibiotics set up   Consultants: Neurosurgery Infectious disease  Procedures: 2D echo: no evidence of vegetations Open T7, T8 left hemilaminectomy, medial facetectomy; open T12-L1 left hemilaminectomy, medial facetectomy; open L3-4 hemilaminectomy, medial facetectomy for evacuation of multi loculated epidural abscess  Antimicrobials: IV vancomycin 6/19-6/22 Daptomycin 6/22> ongoing through 02/02/21 per ID  DVT prophylaxis: Lovenox  Level of care: Med-Surg   Subjective No acute issues or events overnight, up and out of bed early this morning feeling much better  Objective: Vitals:   12/24/20 2110 12/25/20 0440  BP: 133/82 (!) 147/91  Pulse: (!) 112 (!) 102  Resp: 18 18  Temp: 97.9 F (36.6 C) 97.8 F (36.6 C)  SpO2: 93% 97%    Intake/Output Summary (Last 24 hours) at 12/25/2020 0744 Last data filed at 12/24/2020 1700 Gross per 24 hour  Intake 1003 ml  Output 800 ml  Net 203 ml    Filed Weights   12/23/20 0500 12/24/20 0451 12/25/20 0500  Weight: 96.6 kg 94.2 kg 97.6 kg  Body mass index is 30.01 kg/m.  Exam: General exam: Alert, awake,  oriented x 3 Respiratory system: Clear to auscultation. Respiratory effort normal. Cardiovascular system:RRR. No murmurs, rubs, gallops. Gastrointestinal system: Abdomen is nondistended, soft and nontender. No organomegaly or masses felt. Normal bowel sounds heard. Central nervous system: Alert and oriented. No focal neurological deficits. Extremities: No C/C/E, +pedal pulses Skin: No rashes, lesions or ulcers Psychiatry: Judgement and insight appear normal. Mood & affect appropriate.    Data Reviewed: CBC: Recent Labs  Lab 12/22/20 0504 12/23/20 0620 12/23/20 1726 12/24/20 0419 12/25/20 0321  WBC 27.5* 22.3* 25.5* 23.8* 24.6*  HGB 13.4 12.7* 13.4 13.2 13.4  HCT 39.6 37.1* 39.1 37.7* 39.3  MCV 86.7 86.5 86.3 85.9 86.4  PLT 479* 529* 538* 511* 595*    Basic Metabolic Panel: Recent Labs  Lab 12/22/20 0504 12/23/20 0620 12/23/20 1726 12/24/20 0419 12/25/20 0321  NA 131* 132* 129* 132* 128*  K 4.4 4.1 3.5 3.5 3.8  CL 95* 96* 95* 95* 89*  CO2 $Re'25 27 25 26 26  'Obo$ GLUCOSE 311* 263* 234* 215* 235*  BUN 13 7 <5* 6 7  CREATININE 0.86 0.70 0.67 0.73 0.66  CALCIUM 7.9* 7.9* 8.0* 8.1* 8.6*    GFR: Estimated Creatinine Clearance: 121.3 mL/min (by C-G formula based on SCr of 0.66 mg/dL). Liver Function Tests: No results for input(s): AST, ALT, ALKPHOS, BILITOT, PROT, ALBUMIN in the last 168 hours.  No results for input(s): LIPASE, AMYLASE in the last 168 hours. No results for input(s): AMMONIA in the last 168 hours.  Coagulation Profile: No results for input(s): INR, PROTIME in the last 168 hours.  Cardiac Enzymes: Recent Labs  Lab 12/23/20 0620  CKTOTAL 180    BNP (last 3 results) No results for input(s): PROBNP in the last 8760 hours. HbA1C: No results for input(s): HGBA1C in the last 72 hours.  CBG: Recent Labs  Lab 12/23/20 2107 12/24/20 0644 12/24/20 1155 12/24/20 1616 12/24/20 2055  GLUCAP 200* 253* 219* 233* 224*    Lipid Profile: No results for  input(s): CHOL, HDL, LDLCALC, TRIG, CHOLHDL, LDLDIRECT in the last 72 hours. Thyroid Function Tests: No results for input(s): TSH, T4TOTAL, FREET4, T3FREE, THYROIDAB in the last 72 hours. Anemia Panel: No results for input(s): VITAMINB12, FOLATE, FERRITIN, TIBC, IRON, RETICCTPCT in the last 72 hours. Urine analysis:    Component Value Date/Time   COLORURINE YELLOW 12/16/2020 1615   APPEARANCEUR CLEAR 12/16/2020 1615   LABSPEC 1.018 12/16/2020 1615   PHURINE 6.0 12/16/2020 1615   GLUCOSEU >=500 (A) 12/16/2020 1615   HGBUR LARGE (A) 12/16/2020 1615   BILIRUBINUR NEGATIVE 12/16/2020 1615   KETONESUR 20 (A) 12/16/2020 1615   PROTEINUR 30 (A) 12/16/2020 1615   NITRITE NEGATIVE 12/16/2020 1615   LEUKOCYTESUR NEGATIVE 12/16/2020 1615   Sepsis Labs: $RemoveBefo'@[email or web address]'[id number]$ (procalcitonin:4,lacticidven:4)  ) Recent Results (from the past 240 hour(s))  Surgical pcr screen     Status: Abnormal   Collection Time: 12/21/20  8:35 AM   Specimen: Nasal Mucosa; Nasal Swab  Result Value Ref Range Status   MRSA, PCR POSITIVE (A) NEGATIVE Final    Comment: RESULT CALLED TO, READ BACK BY AND VERIFIED WITH: RN A.CARTER AT 1023 ON 12/21/2020 BY T.SAAD.    Staphylococcus aureus POSITIVE (A) NEGATIVE Final    Comment: (NOTE) The Xpert SA Assay (FDA approved for NASAL specimens in patients 37 years of age and older), is one component of a comprehensive surveillance program. It is not intended to diagnose infection nor to guide  or monitor treatment. Performed at [facility], [place] [address], [place], [place] [address]   Aerobic/Anaerobic Culture w Gram Stain (surgical/deep wound)     Status: None (Preliminary result)   Collection Time: 12/21/20  1:00 PM   Specimen: Wound; Abscess  Result Value Ref Range Status   Specimen Description ABSCESS  Final   Special Requests LUMBAR 3,4 EPIDURAL PT ON DAPTOMYCIN  Final   Gram Stain   Final    FEW WBC PRESENT,BOTH PMN AND MONONUCLEAR RARE GRAM POSITIVE COCCI IN  PAIRS IN CLUSTERS Performed at [facility], [address], [place], [place] [address]    Culture   Final    FEW METHICILLIN RESISTANT STAPHYLOCOCCUS AUREUS NO ANAEROBES ISOLATED; CULTURE IN PROGRESS FOR 5 DAYS    Report Status PENDING  Incomplete   Organism ID, Bacteria METHICILLIN RESISTANT STAPHYLOCOCCUS AUREUS  Final      Susceptibility   Methicillin resistant staphylococcus aureus - MIC*    CIPROFLOXACIN >=8 RESISTANT Resistant     ERYTHROMYCIN >=8 RESISTANT Resistant     GENTAMICIN <=0.5 SENSITIVE Sensitive     OXACILLIN >=4 RESISTANT Resistant     TETRACYCLINE <=1 SENSITIVE Sensitive     VANCOMYCIN 1 SENSITIVE Sensitive     TRIMETH/SULFA 160 RESISTANT Resistant     CLINDAMYCIN <=0.25 SENSITIVE Sensitive     RIFAMPIN <=0.5 SENSITIVE Sensitive     Inducible Clindamycin NEGATIVE Sensitive     * FEW METHICILLIN RESISTANT STAPHYLOCOCCUS AUREUS  Aerobic/Anaerobic Culture w Gram Stain (surgical/deep wound)     Status: None (Preliminary result)   Collection Time: 12/21/20  1:05 PM   Specimen: Wound; Abscess  Result Value Ref Range Status   Specimen Description ABSCESS  Final   Special Requests LUMBAR 3,4 ABSCESS PT ON DAPTOMYCIN  Final   Gram Stain   Final    MODERATE WBC PRESENT, PREDOMINANTLY PMN RARE GRAM POSITIVE COCCI IN PAIRS Performed at [facility], [place] [address] [place], [place] [address]    Culture   Final    RARE METHICILLIN RESISTANT STAPHYLOCOCCUS AUREUS NO ANAEROBES ISOLATED; CULTURE IN PROGRESS FOR 5 DAYS    Report Status PENDING  Incomplete   Organism ID, Bacteria METHICILLIN RESISTANT STAPHYLOCOCCUS AUREUS  Final      Susceptibility   Methicillin resistant staphylococcus aureus - MIC*    CIPROFLOXACIN >=8 RESISTANT Resistant     ERYTHROMYCIN >=8 RESISTANT Resistant     GENTAMICIN <=0.5 SENSITIVE Sensitive     OXACILLIN >=4 RESISTANT Resistant     TETRACYCLINE <=1 SENSITIVE Sensitive     VANCOMYCIN <=0.5 SENSITIVE Sensitive      TRIMETH/SULFA 80 RESISTANT Resistant     CLINDAMYCIN <=0.25 SENSITIVE Sensitive     RIFAMPIN <=0.5 SENSITIVE Sensitive     Inducible Clindamycin NEGATIVE Sensitive     * RARE METHICILLIN RESISTANT STAPHYLOCOCCUS AUREUS  Aerobic/Anaerobic Culture w Gram Stain (surgical/deep wound)     Status: None (Preliminary result)   Collection Time: 12/21/20  2:17 PM   Specimen: Wound; Abscess  Result Value Ref Range Status   Specimen Description ABSCESS  Final   Special Requests THORACIC 7,8 EPIDURAL PT ON DAPTOMYCIN  Final   Gram Stain   Final    FEW WBC PRESENT, PREDOMINANTLY PMN FEW GRAM POSITIVE COCCI IN PAIRS IN CLUSTERS Performed at [facility], [address], [place], [place] [address]    Culture   Final    FEW STAPHYLOCOCCUS AUREUS SUSCEPTIBILITIES PERFORMED ON PREVIOUS CULTURE WITHIN THE  LAST 5 DAYS. NO ANAEROBES ISOLATED; CULTURE IN PROGRESS FOR 5 DAYS    Report Status PENDING  Incomplete  Aerobic/Anaerobic Culture w Gram Stain (surgical/deep wound)     Status: None (Preliminary result)   Collection Time: 12/21/20  2:32 PM   Specimen: PATH Soft tissue  Result Value Ref Range Status   Specimen Description TISSUE  Final   Special Requests LUMBAR L3,4 EPIDURAL PHLEGMON  Final   Gram Stain   Final    NO WBC SEEN NO ORGANISMS SEEN Performed at [facility], [address], [place], [place] [address]    Culture   Final    RARE METHICILLIN RESISTANT STAPHYLOCOCCUS AUREUS NO ANAEROBES ISOLATED; CULTURE IN PROGRESS FOR 5 DAYS    Report Status PENDING  Incomplete   Organism ID, Bacteria METHICILLIN RESISTANT STAPHYLOCOCCUS AUREUS  Final      Susceptibility   Methicillin resistant staphylococcus aureus - MIC*    CIPROFLOXACIN >=8 RESISTANT Resistant     ERYTHROMYCIN >=8 RESISTANT Resistant     GENTAMICIN <=0.5 SENSITIVE Sensitive     OXACILLIN >=4 RESISTANT Resistant     TETRACYCLINE <=1 SENSITIVE Sensitive     VANCOMYCIN 1 SENSITIVE Sensitive     TRIMETH/SULFA 160  RESISTANT Resistant     CLINDAMYCIN <=0.25 SENSITIVE Sensitive     RIFAMPIN <=0.5 SENSITIVE Sensitive     Inducible Clindamycin NEGATIVE Sensitive     * RARE METHICILLIN RESISTANT STAPHYLOCOCCUS AUREUS  Aerobic/Anaerobic Culture w Gram Stain (surgical/deep wound)     Status: None (Preliminary result)   Collection Time: 12/22/20  4:12 PM   Specimen: Abscess  Result Value Ref Range Status   Specimen Description ABSCESS  Final   Special Requests LEFT PSOAS  Final   Gram Stain   Final    MODERATE WBC PRESENT, PREDOMINANTLY PMN FEW GRAM POSITIVE COCCI IN CLUSTERS Performed at [facility], [address], [place], [place] [address]    Culture   Final    FEW STAPHYLOCOCCUS AUREUS SUSCEPTIBILITIES TO FOLLOW NO ANAEROBES ISOLATED; CULTURE IN PROGRESS FOR 5 DAYS    Report Status PENDING  Incomplete      Studies: No results found.  Scheduled Meds:  (feeding supplement) PROSource Plus  30 mL Oral BID BM   amLODipine  2.5 mg Oral q morning   atorvastatin  40 mg Oral QHS   enoxaparin (LOVENOX) injection  40 mg Subcutaneous Q24H   feeding supplement  1 Container Oral TID BM   gabapentin  200 mg Oral TID   icosapent Ethyl  2 g Oral BID   insulin aspart  0-15 Units Subcutaneous TID WC   lactulose  10 g Oral Daily   losartan  25 mg Oral q morning   metoprolol succinate  25 mg Oral QHS   multivitamin with minerals  1 tablet Oral Daily   mupirocin ointment  1 application Nasal BID   nicotine  21 mg Transdermal Daily   oxyCODONE  20 mg Oral Q12H   pantoprazole  40 mg Oral Daily   polyethylene glycol  17 g Oral BID   sodium chloride flush  3 mL Intravenous Q12H   venlafaxine XR  75 mg Oral Q breakfast    Continuous Infusions:  sodium chloride 250 mL (12/21/20 1716)   DAPTOmycin (CUBICIN)  IV 750 mg (12/24/20 2023)   methocarbamol (ROBAXIN) IV       LOS: 12 days   Little Ishikawa, DO [facility]   12/25/2020, 7:44 AM

## 2020-12-25 NOTE — Progress Notes (Signed)
  NEUROSURGERY PROGRESS NOTE   No issues overnight. Appropriate back soreness. Ambulating well.  EXAM:  BP (!) 141/87 (BP Location: Right Arm)   Pulse (!) 104   Temp 98.2 F (36.8 C) (Oral)   Resp 18   Ht 5\' 11"  (1.803 m)   Wt 97.6 kg   SpO2 98%   BMI 30.01 kg/m   Awake, alert, oriented  Speech fluent, appropriate  CN grossly intact  5/5 BUE/BLE   IMPRESSION:  55 y.o. male s/p multilevel laminectomy for SEA, neurologically intact  PLAN: - stable for d/c from surgical standpoint when PICC placed and home abx - cont current mgmt   56, MD [facility]

## 2020-12-25 NOTE — TOC Progression Note (Signed)
Transition of Care [facility]) - Progression Note    Patient Details  Name: Louis [name] MRN: [id number] Date of Birth: [birth year]  [facility] CM/SW Contact  Bess Kinds, RN Phone Number: [phone] 12/25/2020, 5:48 PM  Clinical Narrative:     Notified by nursing that patient was pending PICC line placement in preparation for DC. Reached out to [facility] with [employer] for IV antibiotics - anticipated plan for DC was Monday. Wife has already been trained for administering IV antibiotics at home. [facility] arranged for [facility] RN for home IV antibiotics - anticipate start of care Tuesday. Pam to reach out about patient possibly being able to DC tomorrow after getting tomorrow's dose of antibiotic (will follow in AM). Pam to reach out to wife to discuss. TOC following for transition needs.   Expected Discharge Plan: Home w Home Health Services Barriers to Discharge: Continued Medical Work up  Expected Discharge Plan and Services Expected Discharge Plan: Home w Home Health Services In-house Referral: NA Discharge Planning Services: CM Consult Post Acute Care Choice: Home Health Living arrangements for the past 2 months: Single Family Home                                       Social Determinants of Health (SDOH) Interventions    Readmission Risk Interventions No flowsheet data found.

## 2020-12-25 NOTE — Progress Notes (Signed)
Occupational Therapy Treatment Patient Details Name: Louis [name] MRN: [id number] DOB: 05-14-1966 Today's Date: 12/25/2020    History of present illness 55 year old male who presented to the [facility] emergency room with left-sided back pain x2 days. CT scan unremarkable, but MRI noted osteomyelitis/discitis at L3-S1 and patient was transferred to [facility] 12/12/20. 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 s/p 12/21/20 T7-8, T12-L1, L3-4 laminectomies for evacuation of epidural abscess.    PMH: of CAD, hypertension, uncontrolled diabetes mellitus.   OT comments  Pt seen for above diagnosis. Awake and alert, premedicated prior to session per RN. Pt presented up in recliner upon entry with wife at bed side. Pt observed leaning toward R side to offset pressure from back, educated to scoot back in seat towards back of chair for additional back support mod I with increased time and effort. Pt able to state 2/3 back precautions. Heavy encouragement throughout session for LB donning of socks and functional mobility from chair to [facility] for safety with functional transfers. Min guard toilet transfer, min guard clothing management, and Max A donning of socks, educated of figure 4 positioning for compensatory strategy, however pain limiting task. Pt is demonstrating some improvement. Pt will benefit to continue skilled OT at DC with HHOT to ensure safety and independence with ADLs.    Follow Up Recommendations  Home health OT;Supervision - Intermittent    Equipment Recommendations  3 in 1 bedside commode    Recommendations for Other Services      Precautions / Restrictions Precautions Precautions: Back Precaution Booklet Issued: Yes (comment) Precaution Comments: Pt able to recall 2/3 back precautions this session, verbal cueing for reminders of no twisting.       Mobility Bed Mobility               General bed mobility comments:  Pt received sitting in recliner upon arrival with wife at bedside.    Transfers Overall transfer level: Needs assistance Equipment used: Rolling walker (2 wheeled) Transfers: Sit to/from Stand Sit to Stand: Min guard         General transfer comment: min G for safety, cues for hand placement.    Balance Overall balance assessment: Needs assistance Sitting-balance support: Feet supported Sitting balance-Leahy Scale: Fair     Standing balance support: During functional activity Standing balance-Leahy Scale: Fair Standing balance comment: pt manages clothing without reliance of UE support or external support                           ADL either performed or assessed with clinical judgement   ADL Overall ADL's : Needs assistance/impaired Eating/Feeding: Set up;Sitting Eating/Feeding Details (indicate cue type and reason): educated to sit up, pt received leaning towards R side, likely to relieve pressure from back. Pt reached for cup of water on table and presented to mouth.   Grooming Details (indicate cue type and reason): declined grooming activities, preferred to do it when going home.             Lower Body Dressing: Maximal assistance Lower Body Dressing Details (indicate cue type and reason): pt educated of compensatory strategy Figure 4 positioning for LB dressing. Pt able to present to figure 4 position, however, observed to demonstrate increased pain to sit up to actively don sock with facial grimaces adn groans. Toilet Transfer: Min guard;Cueing for Teacher, music Details (indicate cue type and reason): cues  for hand placement with use of RW for sit to stand transition. Toileting- Clothing Manipulation and Hygiene: Cueing for safety;Cueing for sequencing;Sitting/lateral lean;Sit to/from stand;Cueing for back precautions;Min guard Toileting - Clothing Manipulation Details (indicate cue type and reason): Pt able to manage clothing with use of  RW for safety in dynamic standing.     Functional mobility during ADLs: Min guard;Rolling walker       Vision   Vision Assessment?: No apparent visual deficits   Perception     Praxis      Cognition Arousal/Alertness: Awake/alert Behavior During Therapy: Impulsive Overall Cognitive Status: Impaired/Different from baseline Area of Impairment: Memory                     Memory: Decreased recall of precautions         General Comments: Pt unable to recall no twisting of back precautions, pt re-educated on BLT        Exercises     Shoulder Instructions       General Comments wife present at bedside.    Pertinent Vitals/ Pain       Pain Assessment: 0-10 Pain Score: 5  Pain Location: back, abdomen, B LE Pain Descriptors / Indicators: Discomfort;Stabbing;Burning;Aching Pain Intervention(s): Limited activity within patient's tolerance;Monitored during session;Repositioned  Home Living                                          Prior Functioning/Environment              Frequency  Min 2X/week        Progress Toward Goals  OT Goals(current goals can now be found in the care plan section)  Progress towards OT goals: Progressing toward goals  Acute Rehab OT Goals Patient Stated Goal: manage pain OT Goal Formulation: With patient Time For Goal Achievement: 01/05/21 Potential to Achieve Goals: Good ADL Goals Pt Will Perform Grooming: with modified independence;standing Pt Will Perform Lower Body Dressing: with min guard assist;sit to/from stand;with adaptive equipment Pt Will Transfer to Toilet: with supervision;ambulating;bedside commode Pt Will Perform Tub/Shower Transfer: with min guard assist;rolling walker;shower seat;Shower transfer Additional ADL Goal #1: Pt will perform log roll technique with less <2 verbal cues for proper sequencing. Additional ADL Goal #2: Pt will increase to supervisionA for ADL functional mobility  with least restrictive AD.  Plan Discharge plan remains appropriate    Co-evaluation                 AM-PAC OT "6 Clicks" Daily Activity     Outcome Measure   Help from another person eating meals?: None Help from another person taking care of personal grooming?: A Little Help from another person toileting, which includes using toliet, bedpan, or urinal?: A Little Help from another person bathing (including washing, rinsing, drying)?: A Lot Help from another person to put on and taking off regular upper body clothing?: A Little Help from another person to put on and taking off regular lower body clothing?: A Lot 6 Click Score: 17    End of Session Equipment Utilized During Treatment: Gait belt;Rolling walker  OT Visit Diagnosis: Unsteadiness on feet (R26.81);Muscle weakness (generalized) (M62.81);Pain Pain - part of body:  (back)   Activity Tolerance Patient limited by pain   Patient Left in bed;with call bell/phone within reach;with family/visitor present   Nurse Communication Mobility status;Other (comment) (pain)  Time: 6440-3474 OT Time Calculation (min): 28 min  Charges: OT General Charges $OT Visit: 1 Visit OT Treatments $Self Care/Home Management : 23-37 mins  [place] Old, MSOT, OTR/L  Supplemental Rehabilitation Services  [phone]    Zigmund Daniel 12/25/2020, 12:49 PM

## 2020-12-26 LAB — GLUCOSE, CAPILLARY
Glucose-Capillary: 200 mg/dL — ABNORMAL HIGH (ref 70–99)
Glucose-Capillary: 210 mg/dL — ABNORMAL HIGH (ref 70–99)

## 2020-12-26 LAB — BASIC METABOLIC PANEL
Anion gap: 13 (ref 5–15)
BUN: 6 mg/dL (ref 6–20)
CO2: 24 mmol/L (ref 22–32)
Calcium: 8.1 mg/dL — ABNORMAL LOW (ref 8.9–10.3)
Chloride: 90 mmol/L — ABNORMAL LOW (ref 98–111)
Creatinine, Ser: 0.71 mg/dL (ref 0.61–1.24)
GFR, Estimated: >60 mL/min (ref >60)
Glucose, Bld: 216 mg/dL — ABNORMAL HIGH (ref 70–99)
Potassium: 4.1 mmol/L (ref 3.5–5.1)
Sodium: 127 mmol/L — ABNORMAL LOW (ref 135–145)

## 2020-12-26 LAB — CBC
HCT: 34 % — ABNORMAL LOW (ref 39.0–52.0)
Hemoglobin: 11.4 g/dL — ABNORMAL LOW (ref 13.0–17.0)
MCH: 29.1 pg (ref 26.0–34.0)
MCHC: 33.5 g/dL (ref 30.0–36.0)
MCV: 86.7 fL (ref 80.0–100.0)
Platelets: 580 10*3/uL — ABNORMAL HIGH (ref 150–400)
RBC: 3.92 MIL/uL — ABNORMAL LOW (ref 4.22–5.81)
RDW: 15.5 % (ref 11.5–15.5)
WBC: 26.9 10*3/uL — ABNORMAL HIGH (ref 4.0–10.5)
nRBC: 0 % (ref 0.0–0.2)

## 2020-12-26 MED ORDER — CHLORHEXIDINE GLUCONATE CLOTH 2 % EX PADS: 6.0000 | MEDICATED_PAD | Freq: Every day | CUTANEOUS | Status: DC

## 2020-12-26 MED ORDER — SODIUM CHLORIDE 0.9% FLUSH: 10.0000 mL | INTRAVENOUS | Status: DC | PRN

## 2020-12-26 MED ORDER — POLYETHYLENE GLYCOL 3350 17 G PO PACK
17.0000 g | PACK | Freq: Three times a day (TID) | ORAL | Status: DC
  Administered 2020-12-26: 17 g via ORAL
  Filled 2020-12-26: qty 1

## 2020-12-26 NOTE — Progress Notes (Addendum)
Peripherally Inserted Central Catheter Placement  The IV Nurse has discussed with the patient and/or persons authorized to consent for the patient, the purpose of this procedure and the potential benefits and risks involved with this procedure.  The benefits include less needle sticks, lab draws from the catheter, and the patient may be discharged home with the catheter. Risks include, but not limited to, infection, bleeding, blood clot (thrombus formation), and puncture of an artery; nerve damage and irregular heartbeat and possibility to perform a PICC exchange if needed/ordered by physician.  Alternatives to this procedure were also discussed.  Bard Power PICC patient education guide, fact sheet on infection prevention and patient information card has been provided to patient /or left at bedside. Wife at bedside also in agreement of procedure.   PICC Placement Documentation  PICC Single Lumen 12/26/20 Right Brachial 42 cm 0 cm (Active)  Indication for Insertion or Continuance of Line Home intravenous therapies (PICC only) 12/26/20 1131  Exposed Catheter (cm) 0 cm 12/26/20 1131  Site Assessment Clean;Dry;Intact 12/26/20 1131  Line Status Flushed;Saline locked;Blood return noted 12/26/20 1131  Dressing Type Transparent 12/26/20 1131  Dressing Status Clean;Dry;Intact 12/26/20 1131  Antimicrobial disc in place? Yes 12/26/20 1131  Safety Lock Not Applicable 12/26/20 1131  Line Care Connections checked and tightened 12/26/20 1131  Line Adjustment (NICU/IV Team Only) No 12/26/20 1131  Dressing Intervention New dressing 12/26/20 1131  Dressing Change Due 01/02/21 12/26/20 1131       Elliot Dally 12/26/2020, 11:32 AM

## 2020-12-26 NOTE — Progress Notes (Signed)
Remove dressing and leave open to air if no drainage per Dr. Maisie Fus neurosurgery.

## 2020-12-26 NOTE — TOC Progression Note (Addendum)
Transition of Care [facility]) - Progression Note    Patient Details  Name: Louis [name] MRN: [id number] Date of Birth: 12-Aug-1965  [facility] CM/SW Contact  Bess Kinds, RN Phone Number: [phone] 12/26/2020, 11:31 AM  Clinical Narrative:     Notified by Elita Quick at [place] and Cindie at [facility] that patient is cleared for discharge today pending PICC placement and today's dose of antibiotics. Home antibiotics to be delivered to the home either tonight or in the AM. Wife to initiate home antibiotics tomorrow night. [facility] to start care on Wedndesday.   Discussed additional disciplines with [facility] - referral accepted for RN and PT only. Will need HH order for RN and PT with [facility].   Verified with patient that current PCP is Bobette Mo, NP in [place], [place].   TOC following for transition needs.  Expected Discharge Plan: Home w Home Health Services Barriers to Discharge: Continued Medical Work up  Expected Discharge Plan and Services Expected Discharge Plan: Home w Home Health Services In-house Referral: NA Discharge Planning Services: CM Consult Post Acute Care Choice: Home Health Living arrangements for the past 2 months: Single Family Home                                       Social Determinants of Health (SDOH) Interventions    Readmission Risk Interventions No flowsheet data found.

## 2020-12-26 NOTE — Discharge Summary (Signed)
Physician Discharge Summary  Louis [name] IOM:[id number] DOB: 1965/10/06 DOA: 12/12/2020  PCP: Ray Church, NP  Admit date: 12/12/2020 Discharge date: 12/26/2020  Admitted From: Home Disposition: Home  Recommendations for Outpatient Follow-up:  Follow up with PCP in 1-2 weeks Please obtain BMP/CBC in one week Please follow up with infectious disease as scheduled  Home Health: Nursing, PT  Discharge Condition: Stable CODE STATUS: Full Diet recommendation: Diabetic  Brief/Interim Summary: 55 year old male with past medical history of CAD, hypertension, uncontrolled diabetes mellitus who presented to the [facility] emergency room with left-sided back pain x2 days.  CT scan unremarkable, but MRI noted osteomyelitis/discitis at L3-S1 and patient was transferred to [facility] to the hospitalist service on 6/19 given lack of infectious disease and neurosurgical coverage in [place].  Patient also noted to have MRSA bacteremia and a left psoas myositis but no discrete fluid collection.  Started on vancomycin. EDP initially discussed case with neurosurgery who recommended no surgical indications at this time.  Infectious disease evaluated patient recommending TTE to rule out endocarditis which was found to be negative.  Patient was started on daptomycin with plans for an 8-week course.  Due to persistent pain and worsening leukocytosis, MRI was repeated on 6/27 that showed evidence of epidural abscess and psoas abscess.  Neurosurgery formally consulted as patient may need operative management.   Patient admitted as above with uncontrolled back pain noted to have osteomyelitis and discitis at the L3-S1 levels with notable left psoas abscess.  Neurosurgery, interventional radiology and infectious disease were consulted for further evaluation and treatment.  Patient status post laminectomy and evacuation of epidural abscess on 12/21/2020.  Patient also had psoas abscess drainage with IR in the interim with  markedly improving symptoms.  Infectious disease following, recommending daptomycin through 02/02/2021 with subsequent transition to p.o. antibiotics per their expertise.  Patient will need close follow-up with surgical and infectious disease teams in the outpatient setting.  At this time given his improving symptoms, pain control on p.o. analgesics and source control for infection he is otherwise stable and agreeable for discharge home to continue home health PT with RN to assist with ongoing IV antibiotic administration via right upper extremity PICC line placed on 12/26/2020.  Of note patient's diabetes is poorly controlled with an A1c of 9, lengthy discussion about need for dietary compliance and medication compliance in the interim.  We discussed that his wound healing would likely be delayed with uncontrolled diabetes, at this time we will resume home medications with strict dietary changes, close follow-up with PCP in the next 1 to 2 weeks for further evaluation of glucose levels and if necessary initiate insulin at that time.  Procedures: 2D echo: no evidence of vegetations Open T7, T8 left hemilaminectomy, medial facetectomy; open T12-L1 left hemilaminectomy, medial facetectomy; open L3-4 hemilaminectomy, medial facetectomy for evacuation of multi loculated epidural abscess  Discharge Diagnoses:  Principal Problem:   MRSA bacteremia Active Problems:   CAD (coronary artery disease)   Dyslipidemia   HTN (hypertension)   Uncontrolled diabetes mellitus ([place])   Vertebral osteomyelitis, acute ([place])   Tachycardia   Overweight (BMI 25.0-29.9)   Constipated   Back pain   Epidural abscess   Psoas abscess [facility])    Discharge Instructions  Discharge Instructions     [facility] pharmacist to adjust dose for Vancomycin, Aminoglycosides and other anti-infective therapies as requested by physician.   Complete by: As directed    [facility] to provide Cath Flo $Remove'2mg'VDrYbsN$   Complete  by: As directed    Administer for PICC line occlusion and as ordered by physician for other access device issues.   Anaphylaxis Kit: Provided to treat any anaphylactic reaction to the medication being provided to the patient if First Dose or when requested by physician   Complete by: As directed    Epinephrine 1mg /ml vial / amp: Administer 0.3mg  (0.24ml) subcutaneously once for moderate to severe anaphylaxis, nurse to call physician and pharmacy when reaction occurs and call 911 if needed for immediate care   Diphenhydramine 50mg /ml IV vial: Administer 25-50mg  IV/IM PRN for first dose reaction, rash, itching, mild reaction, nurse to call physician and pharmacy when reaction occurs   Sodium Chloride 0.9% NS 560ml IV: Administer if needed for hypovolemic blood pressure drop or as ordered by physician after call to physician with anaphylactic reaction   Change dressing on IV access line weekly and PRN   Complete by: As directed    Diet - low sodium heart healthy   Complete by: As directed    Discharge wound care:   Complete by: As directed    Per surgery   Flush IV access with Sodium Chloride 0.9% and Heparin 10 units/ml or 100 units/ml   Complete by: As directed    Home infusion instructions - [facility]   Complete by: As directed    Instructions: Flush IV access with Sodium Chloride 0.9% and Heparin 10units/ml or 100units/ml   Change dressing on IV access line: Weekly and PRN   Instructions Cath Flo 2mg : Administer for PICC Line occlusion and as ordered by physician for other access device   [facility] pharmacist to adjust dose for: Vancomycin, Aminoglycosides and other anti-infective therapies as requested by physician   Incentive spirometry RT   Complete by: As directed    Increase activity slowly   Complete by: As directed    Method of administration may be changed at the discretion of home infusion pharmacist based upon assessment of the patient and/or caregiver's  ability to self-administer the medication ordered   Complete by: As directed       Allergies as of 12/26/2020   No Known Allergies      Medication List     STOP taking these medications    ALPRAZolam 0.25 MG tablet Commonly known as: XANAX   aspirin EC 81 MG tablet   HYDROcodone-acetaminophen 10-325 MG tablet Commonly known as: NORCO   rosuvastatin 20 MG tablet Commonly known as: CRESTOR   tiZANidine 4 MG tablet Commonly known as: ZANAFLEX       TAKE these medications    acetaminophen 325 MG tablet Commonly known as: TYLENOL Take 2 tablets (650 mg total) by mouth every 6 (six) hours as needed for mild pain (or Fever >/= 101). What changed:  medication strength how much to take when to take this reasons to take this   amLODipine 2.5 MG tablet Commonly known as: NORVASC Take 2.5 mg by mouth every morning.   atorvastatin 40 MG tablet Commonly known as: LIPITOR Take 1 tablet (40 mg total) by mouth at bedtime. What changed:  medication strength how much to take   dapagliflozin propanediol 10 MG Tabs tablet Commonly known as: FARXIGA Take 10 mg by mouth every morning.   daptomycin  IVPB Commonly known as: CUBICIN Inject 750 mg into the vein daily. Indication:  MRSA bacteremia/lumbar osteomyelitis  First Dose: Yes Last Day of Therapy:  02/02/21 Labs - Once weekly:  CBC/D, BMP, and CPK  Labs - Every other week:  ESR and CRP Method of administration: IV Push Method of administration may be changed at the discretion of home infusion pharmacist based upon assessment of the patient and/or caregiver's ability to self-administer the medication ordered.   diclofenac Sodium 1 % Gel Commonly known as: VOLTAREN Apply 1 application topically 4 (four) times daily as needed (pain).   furosemide 20 MG tablet Commonly known as: LASIX Take 20 mg by mouth every morning.   gabapentin 100 MG capsule Commonly known as: NEURONTIN Take 2 capsules (200 mg total) by mouth 3  (three) times daily. What changed:  when to take this reasons to take this   glipiZIDE 5 MG tablet Commonly known as: GLUCOTROL Take 5 mg by mouth 2 (two) times daily.   icosapent Ethyl 1 g capsule Commonly known as: VASCEPA Take 2 g by mouth 2 (two) times daily.   lactulose 10 GM/15ML solution Commonly known as: CHRONULAC Take 15 mLs (10 g total) by mouth daily.   lidocaine 5 % Commonly known as: LIDODERM Place 1 patch onto the skin daily as needed (pain). Remove & Discard patch within 12 hours or as directed by MD   losartan 25 MG tablet Commonly known as: COZAAR Take 25 mg by mouth every morning.   metFORMIN 500 MG tablet Commonly known as: GLUCOPHAGE Take 500-1,000 mg by mouth See admin instructions. Take 2 tablets (1000 mg) by mouth every morning and 1 tablet (500 mg) at night   methocarbamol 500 MG tablet Commonly known as: ROBAXIN Take 1 tablet (500 mg total) by mouth every 6 (six) hours as needed for muscle spasms.   metoprolol succinate 25 MG 24 hr tablet Commonly known as: TOPROL-XL Take 25 mg by mouth at bedtime.   omeprazole 20 MG capsule Commonly known as: PRILOSEC Take 20 mg by mouth every morning.   oxyCODONE 20 mg 12 hr tablet Commonly known as: OXYCONTIN Take 1 tablet (20 mg total) by mouth every 12 (twelve) hours.   oxyCODONE 15 MG immediate release tablet Commonly known as: ROXICODONE Take 1 tablet (15 mg total) by mouth every 3 (three) hours as needed for severe pain ((score 7 to 10)).   polyethylene glycol 17 g packet Commonly known as: MIRALAX / GLYCOLAX Take 17 g by mouth 2 (two) times daily.   venlafaxine XR 75 MG 24 hr capsule Commonly known as: EFFEXOR-XR Take 75 mg by mouth every morning.               Durable Medical Equipment  (From admission, onward)           Start     Ordered   12/25/20 1532  DME Walker  Once       Question Answer Comment  Walker: With 5 Inch Wheels   Patient needs a walker to treat with the  following condition Ambulatory dysfunction      12/25/20 1537   12/25/20 1532  DME 3-in-1  Once        12/25/20 1537   12/25/20 1532  DME Shower stool  Once        12/25/20 1537              Discharge Care Instructions  (From admission, onward)           Start     Ordered   12/25/20 0000  Change dressing on IV access line weekly and PRN  (Home infusion instructions - [facility] )  12/25/20 1537   12/25/20 0000  Discharge wound care:       Comments: Per surgery   12/25/20 1537            Follow-up Information     [facility] Follow up.   Specialty: Home Health Services Why: For home health services Contact information: [place] [address] [phone]         Jabier Mutton, MD Follow up.   Specialty: Infectious Diseases Why: 7/29 at 3pm. Please call to re-schedule if you are not able to make this appointment. Contact information: [place] [address] [phone]                No Known Allergies  Consultations: Neurosurgery, interventional radiology, infectious disease  Procedures/Studies: DG Chest 1 View  Result Date: 12/12/2020 CLINICAL DATA:  Sepsis.  Patient reports back and leg pain. EXAM: CHEST  1 VIEW COMPARISON:  Lung bases from abdominal CT yesterday. FINDINGS: Post median sternotomy and CABG. Normal heart size for technique. No focal airspace disease. No pleural effusion, pneumothorax, or pulmonary edema. No acute osseous abnormalities are seen. IMPRESSION: No acute abnormality. Electronically Signed   By: Keith Rake M.D.   On: 12/12/2020 23:03   DG THORACOLUMABAR SPINE  Result Date: 12/21/2020 CLINICAL DATA:  Intraoperative localization for thoracic and lumbar laminectomy EXAM: THORACOLUMBAR SPINE - 2 VIEW; DG C-ARM 1-60 MIN COMPARISON:  None. FLUOROSCOPY TIME:  Fluoroscopy Time:  26 seconds Radiation Exposure Index (if provided by the  fluoroscopic device): 14.21 mGy Number of Acquired Spot Images: 3 FINDINGS: Three spot films were obtained intraoperatively. Initial spot film shows a needle just below the L3-4 interspace in the posterior soft tissues. Subsequent images demonstrate needle localization in the posterior soft tissues at the T7-T8 level and just below the T10-T11 level. Third image shows inferior localization at T12. IMPRESSION: Intraoperative localization in the thoracic and lumbar spine as described Electronically Signed   By: Inez Catalina M.D.   On: 12/21/2020 16:15   CT HEAD WO CONTRAST  Result Date: 12/16/2020 CLINICAL DATA:  Altered mental status EXAM: CT HEAD WITHOUT CONTRAST TECHNIQUE: Contiguous axial images were obtained from the base of the skull through the vertex without intravenous contrast. COMPARISON:  None. FINDINGS: Brain: No evidence of acute infarction, hemorrhage, hydrocephalus, extra-axial collection or mass lesion/mass effect. Vascular: No hyperdense vessel or unexpected calcification. Skull: Normal. Negative for fracture or focal lesion. Sinuses/Orbits: Paranasal sinuses demonstrate mild mucosal thickening. Orbits and their contents are within normal limits. Other: None. IMPRESSION: Mild mucosal thickening in the paranasal sinuses. No acute abnormality noted. Electronically Signed   By: Inez Catalina M.D.   On: 12/16/2020 20:43   MR LUMBAR SPINE WO CONTRAST  Result Date: 12/12/2020 CLINICAL DATA:  Severe left-sided low back pain. EXAM: MRI LUMBAR SPINE WITHOUT CONTRAST TECHNIQUE: Multiplanar, multisequence MR imaging of the lumbar spine was performed. No intravenous contrast was administered. COMPARISON:  CT urogram 12/11/2020 FINDINGS: Segmentation: 5 non rib-bearing lumbar type vertebral bodies are present. The lowest fully formed vertebral body is L5. Alignment: No significant listhesis is present. There is some straightening of the normal cervical lordosis. Vertebrae: Edematous endplate changes are  present along the left side of the endplates at N4-0 and H6-8. Diffuse edematous changes are present at L5-S1. Marrow signal and vertebral body heights are otherwise normal. Conus medullaris and cauda equina: Conus extends to the L1-2 level. Conus and cauda equina appear normal.  Paraspinal and other soft tissues: Edematous changes are present in the left paraspinous soft tissues, centered at L3-4 L4-5. There are changes into the left psoas muscle without a discrete abscess. Disc levels: L1-2: Negative. L2-3: Broad-based disc protrusion is asymmetric to the right. No significant stenosis. L3-4: Abnormal fluid signal is present in the disc space. Broad-based disc protrusion is present. Moderate left and mild right foraminal narrowing is present. L4-5: A broad-based disc protrusion is present. Mild facet hypertrophy is noted bilaterally. Crowding of nerve roots is present. Abnormal fluid signal is present in the disc space, worse on the left. Moderate foraminal narrowing is present bilaterally. L5-S1: Broad-based disc protrusion is asymmetric to the right. Moderate foraminal narrowing is worse on the right. Fluid signal is present in the disc space. IMPRESSION: 1. Abnormal fluid signal in the disc spaces at L3-4, L4-5, and L5-S1 concerning for early disc osteomyelitis. 2. Edematous changes in the left paraspinous soft tissues and left psoas muscle compatible with acute myositis. No discrete abscess is present. 3. Moderate left and mild right foraminal stenosis at L3-4. 4. Moderate foraminal narrowing bilaterally at L4-5 and L5-S1. These results were called by telephone at the time of interpretation on 12/12/2020 at 8:34 pm to provider [facility] RAY , who verbally acknowledged these results. Electronically Signed   By: San Morelle M.D.   On: 12/12/2020 20:36   MR CERVICAL SPINE W WO CONTRAST  Result Date: 12/21/2020 CLINICAL DATA:  55 year old male with thoracic and lumbar discitis osteomyelitis, epidural  abscess. EXAM: MRI CERVICAL SPINE WITHOUT AND WITH CONTRAST TECHNIQUE: Multiplanar and multiecho pulse sequences of the cervical spine, to include the craniocervical junction and cervicothoracic junction, were obtained without and with intravenous contrast. CONTRAST:  80mL GADAVIST GADOBUTROL 1 MMOL/ML IV SOLN in conjunction with contrast enhanced imaging of the thoracic spine reported separately. COMPARISON:  Thoracic MRI from today reported separately. FINDINGS: Alignment: Straightening of cervical lordosis. No spondylolisthesis. Vertebrae: No marrow edema or evidence of acute osseous abnormality. Bone marrow signal is within normal limits at the skull base and in the cervical spine. Cord: No cervical cord signal abnormality despite degenerative mass effect on the spinal cord detailed below. But there does appear to be faint abnormal posterior dural thickening and enhancement and leptomeningeal enhancement along the upper thoracic cord as seen on series 35, image 9. Capacious upper thoracic spinal canal. Posterior Fossa, vertebral arteries, paraspinal tissues: Cervicomedullary junction is within normal limits. Negative visible posterior fossa. Motion artifact on the axial cervical spine imaging. Postoperative or posttraumatic changes to the midline soft tissues at the level of the upper cervical spine. Elsewhere grossly normal visible neck soft tissues. Negative lung apices. Disc levels: Suboptimal due to motion artifact. C2-C3:  Negative. C3-C4: Central disc protrusion best seen on series 20, image 11. Mild spinal stenosis and mild cord mass effect. C4-C5: More broad-based central or left paracentral disc with mild spinal stenosis and mild cord mass effect (series 19, image 17). Moderate appearing bilateral C5 foraminal stenosis. C5-C6: Broad-based posterior disc with mild spinal stenosis and cord mass effect. Moderate left and up to severe right C6 foraminal stenosis. C6-C7: Disc bulging but no spinal stenosis  at this level. Mild to moderate bilateral C7 foraminal stenosis. C7-T1:  Mild facet hypertrophy but otherwise negative. IMPRESSION: 1. Partially degraded by motion. No acute or inflammatory process identified in the cervical spine. 2. Faint abnormal dural thickening and leptomeningeal enhancement along the upper thoracic spinal cord, in conjunction with the abnormal Thoracic Spine MRI  reported separately today. 3. Cervical spine degeneration with mild spinal stenosis secondary to cervical disc disease C3-C4 through C5-C6. Mild cervical cord mass effect but identified no. Cord signal abnormality Electronically Signed   By: Genevie Ann M.D.   On: 12/21/2020 08:20   MR THORACIC SPINE W WO CONTRAST  Result Date: 12/21/2020 CLINICAL DATA:  55 year old male with lumbar discitis osteomyelitis, worsening lumbar epidural abscess continuing into the lower thoracic spine on MRI yesterday. EXAM: MRI THORACIC WITHOUT AND WITH CONTRAST TECHNIQUE: Multiplanar and multiecho pulse sequences of the thoracic spine were obtained without and with intravenous contrast. CONTRAST:  41mL GADAVIST GADOBUTROL 1 MMOL/ML IV SOLN COMPARISON:  Lumbar MRI 12/20/2020, 619 22. FINDINGS: Limited cervical spine imaging:  Grossly negative. Thoracic spine segmentation: Normal, concordant with the lumbar numbering. Alignment:  Preserved thoracic kyphosis. Vertebrae: Abnormal marrow edema and patchy enhancement throughout the T7 and T8 vertebral bodies with abnormal intervening disc space, see details below. No other thoracic marrow edema identified. Small benign posterior T4 vertebral body hemangioma suspected. Cord: Upper thoracic spinal cord signal and morphology are normal to the T5 level. See abnormal spinal canal findings from T6 inferiorly detailed below. No associated cord edema is identified. But there is abundant abnormal intradural enhancement, and also some posterior dural thickening described below. Paraspinal and other soft tissues:  Circumferential paraspinal phlegmon at T7-T8, see details below. No paraspinal muscle abscess is identified. Negative visible lungs and upper abdominal viscera. Disc levels: C7-T1 through T4-T5 levels are within normal limits. T5-T6: Degenerative moderate to severe right side facet hypertrophy. Degenerative moderate to severe right T5 foraminal stenosis. T6-T7: Disc space remains within normal limits at this level but there is abnormal circumferential dural thickening and enhancement which begins posteriorly at the T5-T6 disc level and continues inferiorly. No stenosis. T7-T8: Abnormal disc space loss with increased T2/STIR hyperintensity and paraspinal phlegmon in keeping with discitis osteomyelitis. Bilateral epidural abscesses including fluid collections emanating from the disc spaces ventrally (series 30, images 9 and 13). Ventral slightly greater than dorsal epidural abscess measuring up to 5 mm in thickness at this level with mild mass effect on the spinal cord. Associated abnormal dural and leptomeningeal enhancement at this level. T8-T9: Circumferential epidural abscess and abnormal dural and leptomeningeal enhancement continuing inferiorly from the level above with mild mass effect on the spinal cord (series 29, image 25). There is mild increased signal in this disc space although no convincing T9 endplate inflammation at this time. T9-T10: Continued circumferential epidural abscess, more pronounced ventrally (series 29, image 29). Decreasing dural and leptomeningeal enhancement compared to the levels above. T10-T11: Continued mostly ventral epidural abscess at this level. Mild-to-moderate leptomeningeal and dural enhancement. T11-T12: Continued mostly ventral epidural abscess at this level (series 29, image 35). Mild leptomeningeal and dural enhancement. T12-L1: Mostly ventral epidural abscess. Mild leptomeningeal and dural enhancement (series 33, image 9). IMPRESSION: 1. T7-T8 Discitis Osteomyelitis with  multiloculated, circumferential Epidural Abscess associated with abundant leptomeningeal and dural thickening and enhancement. Subsequent compression and mildly tethering the spinal cord at this level. No cord edema. Paraspinal phlegmon but no associated extra-spinal abscess. 2. The abnormal dural thickening continues cephalad to the T5 level. While ventral > dorsal Epidural Abscess along with leptomeningeal thickening and enhancement continues caudally through the thoracolumbar junction. Associated mass effect on the spinal cord from T7 (moderate) through T12 (mild). 3. Possible early discitis also at T8-T9. Electronically Signed   By: Genevie Ann M.D.   On: 12/21/2020 06:20   MR Lumbar Spine W  Wo Contrast  Result Date: 12/20/2020 CLINICAL DATA:  Follow-up spinal infection. EXAM: MRI LUMBAR SPINE WITHOUT AND WITH CONTRAST TECHNIQUE: Multiplanar and multiecho pulse sequences of the lumbar spine were obtained without and with intravenous contrast. CONTRAST:  82mL GADAVIST GADOBUTROL 1 MMOL/ML IV SOLN COMPARISON:  12/12/2020 FINDINGS: Segmentation: 5 lumbar type vertebral bodies as numbered previously. Alignment:  No malalignment. Vertebrae: Redemonstration of discitis with early endplate osteomyelitis at the L3-4, L4-5 and L5-S1 levels. No evidence of aggressive endplate destruction since the previous study. See below. Conus medullaris and cauda equina: Conus tip is at the L1-2 level. There is been development of a large ventral epidural abscess present at the upper edge of the scan at the T11 level and extending down to inferior L2. Maximal thickness behind L1 is 8 mm. This displaces thecal sac posteriorly. Additional epidural abscess is present behind L3 and L4, also with mass-effect upon the thecal sac. Paraspinal and other soft tissues: Worsening of paraspinous inflammatory changes affecting both psoas muscles left worse than right. 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 Disc levels: No disc space  pathology seen from T11-12 through L1-2. L2-3: Mild bulging of the disc. L3-4 and L4-5: Discitis osteomyelitis as discussed above. Annular bulging. L5-S1: Findings at this level or most consistent with chronic degenerative spondylosis, probably with an element of early disc space infection at this level. Endplate osteophytes and bulging of the disc result in foraminal narrowing right more than left with some potential to affect the exiting right L5 nerve. IMPRESSION: Marked worsening of epidural disease as above, with epidural abscess extending up to the upper edge of the scan, at T11. Extension above that is likely but not characterized. Maximal thickness of the ventral epidural abscess at L1 is 8 mm. Increasing epidural abscess also behind L3 and L4. Worsening paraspinous inflammatory changes left worse than right with frank psoas abscesses now present on the left. The largest single abscess shows a diameter of 1.7 cm. 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 Changes at the disc levels themselves are fairly minor since the previous study, without aggressive endplate destruction. Electronically Signed   By: Nelson Chimes M.D.   On: 12/20/2020 14:40   MR Abdomen W or Wo Contrast  Result Date: 12/12/2020 CLINICAL DATA:  Evaluate left kidney cyst EXAM: MRI ABDOMEN WITHOUT AND WITH CONTRAST TECHNIQUE: Multiplanar multisequence MR imaging of the abdomen was performed both before and after the administration of intravenous contrast. CONTRAST:  9.56mL GADAVIST GADOBUTROL 1 MMOL/ML IV SOLN COMPARISON:  CT AP 12/11/2020 FINDINGS: Lower chest: No acute findings. Hepatobiliary: No mass or other parenchymal abnormality identified. Pancreas: No mass, inflammatory changes, or other parenchymal abnormality identified. Spleen:  Within normal limits in size and appearance. Adrenals/Urinary Tract: Normal adrenal glands. Bilateral perinephric fat stranding is identified. No  hydronephrosis identified bilaterally. Cyst arising off the inferior pole of the left kidney measures 4.2 by 3.3 cm, image 35/7. There is no internal septation or mural nodule identified. No enhancement identified. Stomach/Bowel: Visualized portions within the abdomen are unremarkable. Vascular/Lymphatic: No pathologically enlarged lymph nodes identified. No abdominal aortic aneurysm demonstrated. Other:  None. Musculoskeletal: There are signs of discitis/osteomyelitis involving the L3-4, L4-5 and L5-S1 levels. Surrounding edema and enhancement extends into the left psoas muscle. No discrete fluid collection identified to suggest abscess. IMPRESSION: 1. 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 No suspicious kidney lesions identified at this time. 2. Findings compatible with  discitis/osteomyelitis involving the L3-4, L4-5 and L5-S1 levels. Surrounding edema and enhancement extends into the left psoas muscle. No discrete fluid collection identified to suggest abscess. See lumbar spine report from today. Electronically Signed   By: Kerby Moors M.D.   On: 12/12/2020 20:49   CT ABDOMEN PELVIS W CONTRAST  Result Date: 12/16/2020 CLINICAL DATA:  Abdominal distension Nausea/vomiting Bowel obstruction suspected Abdominal pain, acute, nonlocalized EXAM: CT ABDOMEN AND PELVIS WITH CONTRAST TECHNIQUE: Multidetector CT imaging of the abdomen and pelvis was performed using the standard protocol following bolus administration of intravenous contrast. CONTRAST:  155mL OMNIPAQUE IOHEXOL 300 MG/ML  SOLN COMPARISON:  Noncontrast CT 5 days ago 12/11/2020. Abdominal MRI 12/12/2020 FINDINGS: Lower chest: Trace right pleural effusion. No focal airspace disease. Hepatobiliary: No focal liver abnormality is seen. No gallstones, gallbladder wall thickening, or biliary dilatation. Pancreas: No ductal dilatation or inflammation. Spleen: Normal in size without focal abnormality. Adrenals/Urinary Tract:  No adrenal nodule. No hydronephrosis. Similar bilateral perinephric edema. No renal calculi. Cyst arising from the lower left kidney with peripheral calcifications measures 4.7 cm. 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 No new renal abnormality. Unremarkable urinary bladder. Stomach/Bowel: Minimal fluid in the distal esophagus. Stomach physiologically distended without abnormal gastric distension. There are occasional fluid-filled loops of small bowel without abnormal distension, wall thickening or inflammation. No small bowel obstruction. Normal appendix. Mild colonic distension with air and stool. Mild distal colonic diverticulosis. No diverticulitis, colonic wall thickening, or acute colonic inflammation. There is no colonic obstruction. Vascular/Lymphatic: Moderate aortic atherosclerosis. No aortic aneurysm. Patent portal vein. There are small retroperitoneal lymph nodes not enlarged by size criteria. No inguinal or pelvic adenopathy. Reproductive: Prostatic calcifications. Other: No abdominopelvic ascites. No free air or focal fluid collection. Small fat containing umbilical hernia. There is mild body wall edema. Musculoskeletal: Findings of discitis osteomyelitis at L3-L4, L4-L5, and L5-S1 were better appreciated on prior lumbar MRI. There is endplate sclerosis at Y7-C6 and L5-S1 that appears similar, slight increase endplate irregularity at L3-L4 disc space from prior. Left psoas inflammatory change with enlargement and low density. There is a small 1.4 x 2.2 cm collection within the muscle, series 3, image 52 and series 6, image 59, with small foci of internal air and faint peripheral enhancement. Anterior paraspinal soft tissue thickening at L3-L4 level. IMPRESSION: 1. 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 Slight increase in endplate irregularity at the L3-L4 level. Left psoas myositis. There is a small  ill-defined 1.4 x 2.2 cm collection within the left psoas muscle with small foci of internal air and faint peripheral enhancement, suspicious for phlegmon/developing abscess. 2. No bowel obstruction or other acute abnormality in the abdomen/pelvis. 3. Mild distal colonic diverticulosis without diverticulitis. Mild colonic distension with air and stool but no inflammatory change or obstruction. 4. Trace right pleural effusion. 5. 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 Aortic Atherosclerosis (ICD10-I70.0). Electronically Signed   By: Keith Rake M.D.   On: 12/16/2020 20:48   DG C-Arm 1-60 Min  Result Date: 12/21/2020 CLINICAL DATA:  Intraoperative localization for thoracic and lumbar laminectomy EXAM: THORACOLUMBAR SPINE - 2 VIEW; DG C-ARM 1-60 MIN COMPARISON:  None. FLUOROSCOPY TIME:  Fluoroscopy Time:  26 seconds Radiation Exposure Index (if provided by the fluoroscopic device): 14.21 mGy Number of Acquired Spot Images: 3 FINDINGS: Three spot films were obtained intraoperatively. Initial spot film shows a needle just below the  L3-4 interspace in the posterior soft tissues. Subsequent images demonstrate needle localization in the posterior soft tissues at the T7-T8 level and just below the T10-T11 level. Third image shows inferior localization at T12. IMPRESSION: Intraoperative localization in the thoracic and lumbar spine as described Electronically Signed   By: Inez Catalina M.D.   On: 12/21/2020 16:15   ECHOCARDIOGRAM COMPLETE  Result Date: 12/15/2020    ECHOCARDIOGRAM REPORT   Patient Name:   Louis [name]  Date of Exam: 12/15/2020 Medical Rec #:  [id number]  Height:       71.0 in Accession #:    [id number] Weight:       207.0 lb Date of Birth:  02-15-1966   BSA:          2.140 m Patient Age:    33 years   BP:           109/76 mmHg Patient Gender: M          HR:           104 bpm. Exam Location:  Inpatient Procedure: 2D Echo, Cardiac Doppler and Color Doppler Indications:     Bacteremia  History:        Patient has no prior history of Echocardiogram examinations.                 CAD; Risk Factors:Dyslipidemia and Diabetes.  Sonographer:    Cammy Brochure Referring Phys: [id number] [place]  1. Left ventricular ejection fraction, by estimation, is 50 to 55%. The left ventricle has low normal function. The left ventricle has no regional wall motion abnormalities. There is moderate concentric left ventricular hypertrophy. Left ventricular diastolic parameters are consistent with Grade I diastolic dysfunction (impaired relaxation).  2. Right ventricular systolic function is normal. The right ventricular size is normal.  3. Left atrial size was mildly dilated.  4. The mitral valve is normal in structure. No evidence of mitral valve regurgitation. No evidence of mitral stenosis.  5. The aortic valve is tricuspid. Aortic valve regurgitation is not visualized. No aortic stenosis is present.  6. The inferior vena cava is normal in size with greater than 50% respiratory variability, suggesting right atrial pressure of 3 mmHg. FINDINGS  Left Ventricle: Left ventricular ejection fraction, by estimation, is 50 to 55%. The left ventricle has low normal function. The left ventricle has no regional wall motion abnormalities. The left ventricular internal cavity size was normal in size. There is moderate concentric left ventricular hypertrophy. Left ventricular diastolic parameters are consistent with Grade I diastolic dysfunction (impaired relaxation). Indeterminate filling pressures. Right Ventricle: The right ventricular size is normal. No increase in right ventricular wall thickness. Right ventricular systolic function is normal. Left Atrium: Left atrial size was mildly dilated. Right Atrium: Right atrial size was normal in size. Pericardium: There is no evidence of pericardial effusion. Mitral Valve: The mitral valve is normal in structure. No evidence of mitral valve  regurgitation. No evidence of mitral valve stenosis. Tricuspid Valve: The tricuspid valve is normal in structure. Tricuspid valve regurgitation is not demonstrated. No evidence of tricuspid stenosis. Aortic Valve: The aortic valve is tricuspid. Aortic valve regurgitation is not visualized. No aortic stenosis is present. Aortic valve mean gradient measures 5.0 mmHg. Aortic valve peak gradient measures 8.9 mmHg. Aortic valve area, by VTI measures 3.50 cm. Pulmonic Valve: The pulmonic valve was normal in structure. Pulmonic valve regurgitation is not visualized. No evidence of pulmonic stenosis. Aorta: The aortic root is normal  in size and structure. Venous: The inferior vena cava is normal in size with greater than 50% respiratory variability, suggesting right atrial pressure of 3 mmHg. IAS/Shunts: No atrial level shunt detected by color flow Doppler.  LEFT VENTRICLE PLAX 2D LVIDd:         4.10 cm  Diastology LVIDs:         3.00 cm  LV e' medial:    8.27 cm/s LV PW:         1.30 cm  LV E/e' medial:  10.9 LV IVS:        1.60 cm  LV e' lateral:   13.20 cm/s LVOT diam:     2.40 cm  LV E/e' lateral: 6.8 LV SV:         84 LV SV Index:   39 LVOT Area:     4.52 cm  RIGHT VENTRICLE             IVC RV S prime:     12.00 cm/s  IVC diam: 1.25 cm TAPSE (M-mode): 1.4 cm LEFT ATRIUM             Index       RIGHT ATRIUM           Index LA diam:        3.10 cm 1.45 cm/m  RA Area:     14.80 cm LA Vol (A2C):   47.8 ml 22.34 ml/m RA Volume:   39.60 ml  18.51 ml/m LA Vol (A4C):   69.8 ml 32.62 ml/m LA Biplane Vol: 58.2 ml 27.20 ml/m  AORTIC VALVE AV Area (Vmax):    4.28 cm AV Area (Vmean):   3.91 cm AV Area (VTI):     3.50 cm AV Vmax:           149.00 cm/s AV Vmean:          104.000 cm/s AV VTI:            0.239 m AV Peak Grad:      8.9 mmHg AV Mean Grad:      5.0 mmHg LVOT Vmax:         141.00 cm/s LVOT Vmean:        90.000 cm/s LVOT VTI:          0.185 m LVOT/AV VTI ratio: 0.77  AORTA Ao Root diam: 3.20 cm Ao Asc diam:  3.40  cm MITRAL VALVE MV Area (PHT): 4.60 cm    SHUNTS MV Decel Time: 165 msec    Systemic VTI:  0.18 m MV E velocity: 90.00 cm/s  Systemic Diam: 2.40 cm MV A velocity: 86.60 cm/s MV E/A ratio:  1.04 Skeet Latch MD Electronically signed by Skeet Latch MD Signature Date/Time: 12/15/2020/2:47:29 PM    Final    CT IMAGE GUIDED DRAINAGE BY PERCUTANEOUS CATHETER  Result Date: 12/23/2020 INDICATION: Left psoas abscess EXAM: CT GUIDED DRAINAGE OF LEFT PSOAS ABSCESS ABSCESS MEDICATIONS: The patient is currently admitted to the hospital and receiving intravenous antibiotics. The antibiotics were administered within an appropriate time frame prior to the initiation of the procedure. ANESTHESIA/SEDATION: 4 mg IV Versed 100 mcg IV Fentanyl Moderate Sedation Time:  14 minutes The patient was continuously monitored during the procedure by the interventional radiology nurse under my direct supervision. COMPLICATIONS: None immediate. TECHNIQUE: Informed written consent was obtained from the patient after a thorough discussion of the procedural risks, benefits and alternatives. All questions were addressed. Maximal Sterile Barrier Technique was utilized including caps,  mask, sterile gowns, sterile gloves, sterile drape, hand hygiene and skin antiseptic. A timeout was performed prior to the initiation of the procedure. PROCEDURE: The the left flank skin was prepped with chlorhexidine in a sterile fashion, and a sterile drape was applied covering the operative field. A sterile gown and sterile gloves were used for the procedure. Local anesthesia was provided with 1% Lidocaine. Utilizing CT guidance, 19 gauge Yueh needle was advanced into the pocket of air and fluid in the left psoas muscle. 5 mL of bloody purulent material was aspirated. Samples were sent for Gram stain and culture. Post aspiration CT demonstrated resolution of the air and fluid. IMPRESSION: CT-guided aspiration of left psoas abscess yielding 5 mL of bloody  purulent material. Electronically Signed   By: Sharen Heck  Mir M.D.   On: 12/23/2020 07:39   [place] EKG SITE RITE  Result Date: 12/25/2020 If Site Rite image not attached, placement could not be confirmed due to current cardiac rhythm.    Subjective: No acute issues or events overnight, pain well controlled, denies nausea vomiting diarrhea constipation headache fevers chills chest pain shortness of breath vision changes.   Discharge Exam: Vitals:   12/26/20 0938 12/26/20 1134  BP: (!) 154/93 (!) 150/89  Pulse: (!) 114 (!) 113  Resp: 18   Temp: 98.2 F (36.8 C)   SpO2: 92% 96%   Vitals:   12/25/20 2048 12/26/20 0528 12/26/20 0938 12/26/20 1134  BP: (!) 153/99 128/90 (!) 154/93 (!) 150/89  Pulse: (!) 109 (!) 108 (!) 114 (!) 113  Resp: $Remo'17 20 18   'GRKNr$ Temp: 98.5 F (36.9 C) 99.5 F (37.5 C) 98.2 F (36.8 C)   TempSrc: Oral Oral Oral   SpO2: 100% 93% 92% 96%  Weight:      Height:        General: Pt is alert, awake, not in acute distress Cardiovascular: RRR, S1/S2 +, no rubs, no gallops Respiratory: CTA bilaterally, no wheezing, no rhonchi Abdominal: Soft, NT, ND, bowel sounds + Skin: Postoperative bandage clean dry intact Extremities: no edema, no cyanosis    The results of significant diagnostics from this hospitalization (including imaging, microbiology, ancillary and laboratory) are listed below for reference.     Microbiology: Recent Results (from the past 240 hour(s))  Surgical pcr screen     Status: Abnormal   Collection Time: 12/21/20  8:35 AM   Specimen: Nasal Mucosa; Nasal Swab  Result Value Ref Range Status   MRSA, PCR POSITIVE (A) NEGATIVE Final    Comment: RESULT CALLED TO, READ BACK BY AND VERIFIED WITH: RN A.CARTER AT 1023 ON 12/21/2020 BY T.SAAD.    Staphylococcus aureus POSITIVE (A) NEGATIVE Final    Comment: (NOTE) The Xpert SA Assay (FDA approved for NASAL specimens in patients 41 years of age and older), is one component of a comprehensive surveillance  program. It is not intended to diagnose infection nor to guide or monitor treatment. Performed at [facility], [place] [address], [place], [place] [address]   Aerobic/Anaerobic Culture w Gram Stain (surgical/deep wound)     Status: None (Preliminary result)   Collection Time: 12/21/20  1:00 PM   Specimen: Wound; Abscess  Result Value Ref Range Status   Specimen Description ABSCESS  Final   Special Requests LUMBAR 3,4 EPIDURAL PT ON DAPTOMYCIN  Final   Gram Stain   Final    FEW WBC PRESENT,BOTH PMN AND MONONUCLEAR RARE GRAM POSITIVE COCCI IN PAIRS IN CLUSTERS Performed at [facility],  [address] [place], [place] [address]    Culture   Final    FEW METHICILLIN RESISTANT STAPHYLOCOCCUS AUREUS NO ANAEROBES ISOLATED; CULTURE IN PROGRESS FOR 5 DAYS    Report Status PENDING  Incomplete   Organism ID, Bacteria METHICILLIN RESISTANT STAPHYLOCOCCUS AUREUS  Final      Susceptibility   Methicillin resistant staphylococcus aureus - MIC*    CIPROFLOXACIN >=8 RESISTANT Resistant     ERYTHROMYCIN >=8 RESISTANT Resistant     GENTAMICIN <=0.5 SENSITIVE Sensitive     OXACILLIN >=4 RESISTANT Resistant     TETRACYCLINE <=1 SENSITIVE Sensitive     VANCOMYCIN 1 SENSITIVE Sensitive     TRIMETH/SULFA 160 RESISTANT Resistant     CLINDAMYCIN <=0.25 SENSITIVE Sensitive     RIFAMPIN <=0.5 SENSITIVE Sensitive     Inducible Clindamycin NEGATIVE Sensitive     * FEW METHICILLIN RESISTANT STAPHYLOCOCCUS AUREUS  Aerobic/Anaerobic Culture w Gram Stain (surgical/deep wound)     Status: None (Preliminary result)   Collection Time: 12/21/20  1:05 PM   Specimen: Wound; Abscess  Result Value Ref Range Status   Specimen Description ABSCESS  Final   Special Requests LUMBAR 3,4 ABSCESS PT ON DAPTOMYCIN  Final   Gram Stain   Final    MODERATE WBC PRESENT, PREDOMINANTLY PMN RARE GRAM POSITIVE COCCI IN PAIRS Performed at [facility], [place] [address], [place], [place] [address]    Culture   Final     RARE METHICILLIN RESISTANT STAPHYLOCOCCUS AUREUS NO ANAEROBES ISOLATED; CULTURE IN PROGRESS FOR 5 DAYS    Report Status PENDING  Incomplete   Organism ID, Bacteria METHICILLIN RESISTANT STAPHYLOCOCCUS AUREUS  Final      Susceptibility   Methicillin resistant staphylococcus aureus - MIC*    CIPROFLOXACIN >=8 RESISTANT Resistant     ERYTHROMYCIN >=8 RESISTANT Resistant     GENTAMICIN <=0.5 SENSITIVE Sensitive     OXACILLIN >=4 RESISTANT Resistant     TETRACYCLINE <=1 SENSITIVE Sensitive     VANCOMYCIN <=0.5 SENSITIVE Sensitive     TRIMETH/SULFA 80 RESISTANT Resistant     CLINDAMYCIN <=0.25 SENSITIVE Sensitive     RIFAMPIN <=0.5 SENSITIVE Sensitive     Inducible Clindamycin NEGATIVE Sensitive     * RARE METHICILLIN RESISTANT STAPHYLOCOCCUS AUREUS  Aerobic/Anaerobic Culture w Gram Stain (surgical/deep wound)     Status: None (Preliminary result)   Collection Time: 12/21/20  2:17 PM   Specimen: Wound; Abscess  Result Value Ref Range Status   Specimen Description ABSCESS  Final   Special Requests THORACIC 7,8 EPIDURAL PT ON DAPTOMYCIN  Final   Gram Stain   Final    FEW WBC PRESENT, PREDOMINANTLY PMN FEW GRAM POSITIVE COCCI IN PAIRS IN CLUSTERS Performed at [facility], [address], [place], [place] [address]    Culture   Final    FEW STAPHYLOCOCCUS AUREUS SUSCEPTIBILITIES PERFORMED ON PREVIOUS CULTURE WITHIN THE LAST 5 DAYS. NO ANAEROBES ISOLATED; CULTURE IN PROGRESS FOR 5 DAYS    Report Status PENDING  Incomplete  Aerobic/Anaerobic Culture w Gram Stain (surgical/deep wound)     Status: None (Preliminary result)   Collection Time: 12/21/20  2:32 PM   Specimen: PATH Soft tissue  Result Value Ref Range Status   Specimen Description TISSUE  Final   Special Requests LUMBAR L3,4 EPIDURAL PHLEGMON  Final   Gram Stain   Final    NO WBC SEEN NO ORGANISMS SEEN Performed at [facility], [address], [place], [place] [address]  Culture   Final    RARE  METHICILLIN RESISTANT STAPHYLOCOCCUS AUREUS NO ANAEROBES ISOLATED; CULTURE IN PROGRESS FOR 5 DAYS    Report Status PENDING  Incomplete   Organism ID, Bacteria METHICILLIN RESISTANT STAPHYLOCOCCUS AUREUS  Final      Susceptibility   Methicillin resistant staphylococcus aureus - MIC*    CIPROFLOXACIN >=8 RESISTANT Resistant     ERYTHROMYCIN >=8 RESISTANT Resistant     GENTAMICIN <=0.5 SENSITIVE Sensitive     OXACILLIN >=4 RESISTANT Resistant     TETRACYCLINE <=1 SENSITIVE Sensitive     VANCOMYCIN 1 SENSITIVE Sensitive     TRIMETH/SULFA 160 RESISTANT Resistant     CLINDAMYCIN <=0.25 SENSITIVE Sensitive     RIFAMPIN <=0.5 SENSITIVE Sensitive     Inducible Clindamycin NEGATIVE Sensitive     * RARE METHICILLIN RESISTANT STAPHYLOCOCCUS AUREUS  Aerobic/Anaerobic Culture w Gram Stain (surgical/deep wound)     Status: None (Preliminary result)   Collection Time: 12/22/20  4:12 PM   Specimen: Abscess  Result Value Ref Range Status   Specimen Description ABSCESS  Final   Special Requests LEFT PSOAS  Final   Gram Stain   Final    MODERATE WBC PRESENT, PREDOMINANTLY PMN FEW GRAM POSITIVE COCCI IN CLUSTERS Performed at [facility], [address] [place],  [address]    Culture   Final    FEW METHICILLIN RESISTANT STAPHYLOCOCCUS AUREUS NO ANAEROBES ISOLATED; CULTURE IN PROGRESS FOR 5 DAYS    Report Status PENDING  Incomplete   Organism ID, Bacteria METHICILLIN RESISTANT STAPHYLOCOCCUS AUREUS  Final      Susceptibility   Methicillin resistant staphylococcus aureus - MIC*    CIPROFLOXACIN >=8 RESISTANT Resistant     ERYTHROMYCIN >=8 RESISTANT Resistant     GENTAMICIN <=0.5 SENSITIVE Sensitive     OXACILLIN >=4 RESISTANT Resistant     TETRACYCLINE <=1 SENSITIVE Sensitive     VANCOMYCIN 1 SENSITIVE Sensitive     TRIMETH/SULFA 160 RESISTANT Resistant     CLINDAMYCIN <=0.25 SENSITIVE Sensitive     RIFAMPIN <=0.5 SENSITIVE Sensitive     Inducible Clindamycin NEGATIVE Sensitive      * FEW METHICILLIN RESISTANT STAPHYLOCOCCUS AUREUS     Labs: BNP (last 3 results) No results for input(s): BNP in the last 8760 hours. Basic Metabolic Panel: Recent Labs  Lab 12/23/20 0620 12/23/20 1726 12/24/20 0419 12/25/20 0321 12/26/20 0626  NA 132* 129* 132* 128* 127*  K 4.1 3.5 3.5 3.8 4.1  CL 96* 95* 95* 89* 90*  CO2 $Re'27 25 26 26 24  'lqV$ GLUCOSE 263* 234* 215* 235* 216*  BUN 7 <5* $Rem'6 7 6  'cSna$ CREATININE 0.70 0.67 0.73 0.66 0.71  CALCIUM 7.9* 8.0* 8.1* 8.6* 8.1*   Liver Function Tests: No results for input(s): AST, ALT, ALKPHOS, BILITOT, PROT, ALBUMIN in the last 168 hours. No results for input(s): LIPASE, AMYLASE in the last 168 hours. No results for input(s): AMMONIA in the last 168 hours. CBC: Recent Labs  Lab 12/23/20 0620 12/23/20 1726 12/24/20 0419 12/25/20 0321 12/26/20 0626  WBC 22.3* 25.5* 23.8* 24.6* 26.9*  HGB 12.7* 13.4 13.2 13.4 11.4*  HCT 37.1* 39.1 37.7* 39.3 34.0*  MCV 86.5 86.3 85.9 86.4 86.7  PLT 529* 538* 511* 595* 580*   Cardiac Enzymes: Recent Labs  Lab 12/23/20 0620  CKTOTAL 180   BNP: Invalid input(s): POCBNP CBG: Recent Labs  Lab 12/25/20 1132 12/25/20 1701 12/25/20 2113 12/26/20 0628 12/26/20 1137  GLUCAP 267* 195* 217* 200* 210*  D-Dimer No results for input(s): DDIMER in the last 72 hours. Hgb A1c No results for input(s): HGBA1C in the last 72 hours. Lipid Profile No results for input(s): CHOL, HDL, LDLCALC, TRIG, CHOLHDL, LDLDIRECT in the last 72 hours. Thyroid function studies No results for input(s): TSH, T4TOTAL, T3FREE, THYROIDAB in the last 72 hours.  Invalid input(s): FREET3 Anemia work up No results for input(s): VITAMINB12, FOLATE, FERRITIN, TIBC, IRON, RETICCTPCT in the last 72 hours. Urinalysis    Component Value Date/Time   COLORURINE YELLOW 12/16/2020 1615   APPEARANCEUR CLEAR 12/16/2020 1615   LABSPEC 1.018 12/16/2020 1615   PHURINE 6.0 12/16/2020 1615   GLUCOSEU >=500 (A) 12/16/2020 1615   HGBUR  LARGE (A) 12/16/2020 1615   BILIRUBINUR NEGATIVE 12/16/2020 1615   KETONESUR 20 (A) 12/16/2020 1615   PROTEINUR 30 (A) 12/16/2020 1615   NITRITE NEGATIVE 12/16/2020 1615   LEUKOCYTESUR NEGATIVE 12/16/2020 1615   Sepsis Labs Invalid input(s): PROCALCITONIN,  WBC,  LACTICIDVEN Microbiology Recent Results (from the past 240 hour(s))  Surgical pcr screen     Status: Abnormal   Collection Time: 12/21/20  8:35 AM   Specimen: Nasal Mucosa; Nasal Swab  Result Value Ref Range Status   MRSA, PCR POSITIVE (A) NEGATIVE Final    Comment: RESULT CALLED TO, READ BACK BY AND VERIFIED WITH: RN A.CARTER AT 1023 ON 12/21/2020 BY T.SAAD.    Staphylococcus aureus POSITIVE (A) NEGATIVE Final    Comment: (NOTE) The Xpert SA Assay (FDA approved for NASAL specimens in patients 12 years of age and older), is one component of a comprehensive surveillance program. It is not intended to diagnose infection nor to guide or monitor treatment. Performed at [facility], [place] [address], [place], [place] [address]   Aerobic/Anaerobic Culture w Gram Stain (surgical/deep wound)     Status: None (Preliminary result)   Collection Time: 12/21/20  1:00 PM   Specimen: Wound; Abscess  Result Value Ref Range Status   Specimen Description ABSCESS  Final   Special Requests LUMBAR 3,4 EPIDURAL PT ON DAPTOMYCIN  Final   Gram Stain   Final    FEW WBC PRESENT,BOTH PMN AND MONONUCLEAR RARE GRAM POSITIVE COCCI IN PAIRS IN CLUSTERS Performed at [facility], [address] [place], [place] [address]    Culture   Final    FEW METHICILLIN RESISTANT STAPHYLOCOCCUS AUREUS NO ANAEROBES ISOLATED; CULTURE IN PROGRESS FOR 5 DAYS    Report Status PENDING  Incomplete   Organism ID, Bacteria METHICILLIN RESISTANT STAPHYLOCOCCUS AUREUS  Final      Susceptibility   Methicillin resistant staphylococcus aureus - MIC*    CIPROFLOXACIN >=8 RESISTANT Resistant     ERYTHROMYCIN >=8 RESISTANT Resistant     GENTAMICIN <=0.5  SENSITIVE Sensitive     OXACILLIN >=4 RESISTANT Resistant     TETRACYCLINE <=1 SENSITIVE Sensitive     VANCOMYCIN 1 SENSITIVE Sensitive     TRIMETH/SULFA 160 RESISTANT Resistant     CLINDAMYCIN <=0.25 SENSITIVE Sensitive     RIFAMPIN <=0.5 SENSITIVE Sensitive     Inducible Clindamycin NEGATIVE Sensitive     * FEW METHICILLIN RESISTANT STAPHYLOCOCCUS AUREUS  Aerobic/Anaerobic Culture w Gram Stain (surgical/deep wound)     Status: None (Preliminary result)   Collection Time: 12/21/20  1:05 PM   Specimen: Wound; Abscess  Result Value Ref Range Status   Specimen Description ABSCESS  Final   Special Requests LUMBAR 3,4 ABSCESS PT ON DAPTOMYCIN  Final   Gram Stain   Final  MODERATE WBC PRESENT, PREDOMINANTLY PMN RARE GRAM POSITIVE COCCI IN PAIRS Performed at [facility], [place] [address], [place], [place] [address]    Culture   Final    RARE METHICILLIN RESISTANT STAPHYLOCOCCUS AUREUS NO ANAEROBES ISOLATED; CULTURE IN PROGRESS FOR 5 DAYS    Report Status PENDING  Incomplete   Organism ID, Bacteria METHICILLIN RESISTANT STAPHYLOCOCCUS AUREUS  Final      Susceptibility   Methicillin resistant staphylococcus aureus - MIC*    CIPROFLOXACIN >=8 RESISTANT Resistant     ERYTHROMYCIN >=8 RESISTANT Resistant     GENTAMICIN <=0.5 SENSITIVE Sensitive     OXACILLIN >=4 RESISTANT Resistant     TETRACYCLINE <=1 SENSITIVE Sensitive     VANCOMYCIN <=0.5 SENSITIVE Sensitive     TRIMETH/SULFA 80 RESISTANT Resistant     CLINDAMYCIN <=0.25 SENSITIVE Sensitive     RIFAMPIN <=0.5 SENSITIVE Sensitive     Inducible Clindamycin NEGATIVE Sensitive     * RARE METHICILLIN RESISTANT STAPHYLOCOCCUS AUREUS  Aerobic/Anaerobic Culture w Gram Stain (surgical/deep wound)     Status: None (Preliminary result)   Collection Time: 12/21/20  2:17 PM   Specimen: Wound; Abscess  Result Value Ref Range Status   Specimen Description ABSCESS  Final   Special Requests THORACIC 7,8 EPIDURAL PT ON DAPTOMYCIN  Final    Gram Stain   Final    FEW WBC PRESENT, PREDOMINANTLY PMN FEW GRAM POSITIVE COCCI IN PAIRS IN CLUSTERS Performed at [facility], [address], [place], [place] [address]    Culture   Final    FEW STAPHYLOCOCCUS AUREUS SUSCEPTIBILITIES PERFORMED ON PREVIOUS CULTURE WITHIN THE LAST 5 DAYS. NO ANAEROBES ISOLATED; CULTURE IN PROGRESS FOR 5 DAYS    Report Status PENDING  Incomplete  Aerobic/Anaerobic Culture w Gram Stain (surgical/deep wound)     Status: None (Preliminary result)   Collection Time: 12/21/20  2:32 PM   Specimen: PATH Soft tissue  Result Value Ref Range Status   Specimen Description TISSUE  Final   Special Requests LUMBAR L3,4 EPIDURAL PHLEGMON  Final   Gram Stain   Final    NO WBC SEEN NO ORGANISMS SEEN Performed at [facility], [address], [place],  [address]    Culture   Final    RARE METHICILLIN RESISTANT STAPHYLOCOCCUS AUREUS NO ANAEROBES ISOLATED; CULTURE IN PROGRESS FOR 5 DAYS    Report Status PENDING  Incomplete   Organism ID, Bacteria METHICILLIN RESISTANT STAPHYLOCOCCUS AUREUS  Final      Susceptibility   Methicillin resistant staphylococcus aureus - MIC*    CIPROFLOXACIN >=8 RESISTANT Resistant     ERYTHROMYCIN >=8 RESISTANT Resistant     GENTAMICIN <=0.5 SENSITIVE Sensitive     OXACILLIN >=4 RESISTANT Resistant     TETRACYCLINE <=1 SENSITIVE Sensitive     VANCOMYCIN 1 SENSITIVE Sensitive     TRIMETH/SULFA 160 RESISTANT Resistant     CLINDAMYCIN <=0.25 SENSITIVE Sensitive     RIFAMPIN <=0.5 SENSITIVE Sensitive     Inducible Clindamycin NEGATIVE Sensitive     * RARE METHICILLIN RESISTANT STAPHYLOCOCCUS AUREUS  Aerobic/Anaerobic Culture w Gram Stain (surgical/deep wound)     Status: None (Preliminary result)   Collection Time: 12/22/20  4:12 PM   Specimen: Abscess  Result Value Ref Range Status   Specimen Description ABSCESS  Final   Special Requests LEFT PSOAS  Final   Gram Stain   Final    MODERATE WBC PRESENT,  PREDOMINANTLY PMN FEW GRAM POSITIVE COCCI IN CLUSTERS  Performed at [facility], [place] [address], [place], [place] [address]    Culture   Final    FEW METHICILLIN RESISTANT STAPHYLOCOCCUS AUREUS NO ANAEROBES ISOLATED; CULTURE IN PROGRESS FOR 5 DAYS    Report Status PENDING  Incomplete   Organism ID, Bacteria METHICILLIN RESISTANT STAPHYLOCOCCUS AUREUS  Final      Susceptibility   Methicillin resistant staphylococcus aureus - MIC*    CIPROFLOXACIN >=8 RESISTANT Resistant     ERYTHROMYCIN >=8 RESISTANT Resistant     GENTAMICIN <=0.5 SENSITIVE Sensitive     OXACILLIN >=4 RESISTANT Resistant     TETRACYCLINE <=1 SENSITIVE Sensitive     VANCOMYCIN 1 SENSITIVE Sensitive     TRIMETH/SULFA 160 RESISTANT Resistant     CLINDAMYCIN <=0.25 SENSITIVE Sensitive     RIFAMPIN <=0.5 SENSITIVE Sensitive     Inducible Clindamycin NEGATIVE Sensitive     * FEW METHICILLIN RESISTANT STAPHYLOCOCCUS AUREUS     Time coordinating discharge: Over 30 minutes  SIGNED:   Little Ishikawa, DO [facility] 12/26/2020, 12:14 PM Pager   If 7PM-7AM, please contact night-coverage [email or web address]

## 2020-12-27 DIAGNOSIS — R7881 Bacteremia: Secondary | ICD-10-CM | POA: Diagnosis not present

## 2020-12-27 LAB — AEROBIC/ANAEROBIC CULTURE W GRAM STAIN (SURGICAL/DEEP WOUND): Gram Stain: NONE SEEN

## 2020-12-28 DIAGNOSIS — R7881 Bacteremia: Secondary | ICD-10-CM | POA: Diagnosis not present

## 2020-12-28 LAB — AEROBIC/ANAEROBIC CULTURE W GRAM STAIN (SURGICAL/DEEP WOUND)

## 2020-12-29 ENCOUNTER — Emergency Department (HOSPITAL_COMMUNITY)
Admission: EM | Admit: 2020-12-29 | Discharge: 2020-12-30 | Disposition: A | Discharge status: Home / Self Care | Payer: Federal, State, Local not specified - PPO | Attending: Emergency Medicine | Admitting: Emergency Medicine

## 2020-12-29 ENCOUNTER — Emergency Department (HOSPITAL_COMMUNITY): Payer: Federal, State, Local not specified - PPO

## 2020-12-29 DIAGNOSIS — M4626 Osteomyelitis of vertebra, lumbar region: Secondary | ICD-10-CM | POA: Diagnosis not present

## 2020-12-29 DIAGNOSIS — R7881 Bacteremia: Secondary | ICD-10-CM | POA: Diagnosis not present

## 2020-12-29 DIAGNOSIS — E871 Hypo-osmolality and hyponatremia: Secondary | ICD-10-CM | POA: Diagnosis not present

## 2020-12-29 DIAGNOSIS — R0602 Shortness of breath: Secondary | ICD-10-CM | POA: Diagnosis not present

## 2020-12-29 DIAGNOSIS — R0902 Hypoxemia: Secondary | ICD-10-CM | POA: Diagnosis not present

## 2020-12-29 DIAGNOSIS — E119 Type 2 diabetes mellitus without complications: Secondary | ICD-10-CM | POA: Insufficient documentation

## 2020-12-29 DIAGNOSIS — R911 Solitary pulmonary nodule: Secondary | ICD-10-CM | POA: Diagnosis not present

## 2020-12-29 DIAGNOSIS — R000 Tachycardia, unspecified: Secondary | ICD-10-CM | POA: Diagnosis not present

## 2020-12-29 DIAGNOSIS — Z79899 Other long term (current) drug therapy: Secondary | ICD-10-CM | POA: Insufficient documentation

## 2020-12-29 DIAGNOSIS — M549 Dorsalgia, unspecified: Secondary | ICD-10-CM | POA: Diagnosis not present

## 2020-12-29 DIAGNOSIS — I10 Essential (primary) hypertension: Secondary | ICD-10-CM | POA: Diagnosis not present

## 2020-12-29 DIAGNOSIS — Z7984 Long term (current) use of oral hypoglycemic drugs: Secondary | ICD-10-CM | POA: Diagnosis not present

## 2020-12-29 DIAGNOSIS — J90 Pleural effusion, not elsewhere classified: Secondary | ICD-10-CM | POA: Diagnosis not present

## 2020-12-29 DIAGNOSIS — J9811 Atelectasis: Secondary | ICD-10-CM | POA: Diagnosis not present

## 2020-12-29 DIAGNOSIS — R2689 Other abnormalities of gait and mobility: Secondary | ICD-10-CM | POA: Diagnosis not present

## 2020-12-29 DIAGNOSIS — R069 Unspecified abnormalities of breathing: Secondary | ICD-10-CM | POA: Diagnosis not present

## 2020-12-29 DIAGNOSIS — I2510 Atherosclerotic heart disease of native coronary artery without angina pectoris: Secondary | ICD-10-CM | POA: Diagnosis not present

## 2020-12-29 DIAGNOSIS — E1159 Type 2 diabetes mellitus with other circulatory complications: Secondary | ICD-10-CM | POA: Diagnosis not present

## 2020-12-29 DIAGNOSIS — D72829 Elevated white blood cell count, unspecified: Secondary | ICD-10-CM | POA: Insufficient documentation

## 2020-12-29 LAB — CBC WITH DIFFERENTIAL/PLATELET
Abs Immature Granulocytes: 0.18 10*3/uL — ABNORMAL HIGH (ref 0.00–0.07)
Basophils Absolute: 0.1 10*3/uL (ref 0.0–0.1)
Basophils Relative: 0 %
Eosinophils Absolute: 0.1 10*3/uL (ref 0.0–0.5)
Eosinophils Relative: 0 %
HCT: 32.8 % — ABNORMAL LOW (ref 39.0–52.0)
Hemoglobin: 11 g/dL — ABNORMAL LOW (ref 13.0–17.0)
Immature Granulocytes: 1 %
Lymphocytes Relative: 7 %
Lymphs Abs: 1.6 10*3/uL (ref 0.7–4.0)
MCH: 29.3 pg (ref 26.0–34.0)
MCHC: 33.5 g/dL (ref 30.0–36.0)
MCV: 87.2 fL (ref 80.0–100.0)
Monocytes Absolute: 1.4 10*3/uL — ABNORMAL HIGH (ref 0.1–1.0)
Monocytes Relative: 7 %
Neutro Abs: 18.8 10*3/uL — ABNORMAL HIGH (ref 1.7–7.7)
Neutrophils Relative %: 85 %
Platelets: 574 10*3/uL — ABNORMAL HIGH (ref 150–400)
RBC: 3.76 MIL/uL — ABNORMAL LOW (ref 4.22–5.81)
RDW: 15.2 % (ref 11.5–15.5)
WBC: 22.1 10*3/uL — ABNORMAL HIGH (ref 4.0–10.5)
nRBC: 0 % (ref 0.0–0.2)

## 2020-12-29 LAB — BASIC METABOLIC PANEL
Anion gap: 12 (ref 5–15)
BUN: 9 mg/dL (ref 6–20)
CO2: 26 mmol/L (ref 22–32)
Calcium: 7.8 mg/dL — ABNORMAL LOW (ref 8.9–10.3)
Chloride: 86 mmol/L — ABNORMAL LOW (ref 98–111)
Creatinine, Ser: 0.84 mg/dL (ref 0.61–1.24)
GFR, Estimated: >60 mL/min (ref >60)
Glucose, Bld: 151 mg/dL — ABNORMAL HIGH (ref 70–99)
Potassium: 3.4 mmol/L — ABNORMAL LOW (ref 3.5–5.1)
Sodium: 124 mmol/L — ABNORMAL LOW (ref 135–145)

## 2020-12-29 LAB — BRAIN NATRIURETIC PEPTIDE: B Natriuretic Peptide: 303.5 pg/mL — ABNORMAL HIGH (ref 0.0–100.0)

## 2020-12-29 MED ORDER — IOHEXOL 350 MG/ML SOLN
50.0000 mL | Freq: Once | INTRAVENOUS | Status: AC | PRN
  Administered 2020-12-29: 50 mL via INTRAVENOUS

## 2020-12-29 MED ORDER — SODIUM CHLORIDE 0.9 % IV BOLUS
1000.0000 mL | Freq: Once | INTRAVENOUS | Status: AC
  Administered 2020-12-29: 1000 mL via INTRAVENOUS

## 2020-12-29 NOTE — ED Triage Notes (Signed)
Came in via EMS; s/p back surgery last Tuesday and was discharge 12/26/2020; EMS endorsed concerned for PE, unable to obtain Sp02; patient awake and talking;

## 2020-12-29 NOTE — ED Notes (Signed)
Patient ambulated in hallway to restroom. Dan Humphreys was used for steadying as that is what patient has been using at home post-op. SpO2 remained greater than 95% on room air while ambulating. No complaints while ambulating.

## 2020-12-29 NOTE — ED Provider Notes (Signed)
[facility] EMERGENCY DEPARTMENT Provider Note   CSN: [id number] Arrival date & time: 12/29/20  1923     History Chief Complaint  Patient presents with   Post-op Problem    Louis [name] is a 55 y.o. male.  Pt presents to the ED today with sob.  Pt was d/c on 7/3 after an admission from 6/19-7/3 for epidural abscess/osteomyelitis/discitis.  He has been getting his daptomycin IV via the PICC line.  The pt has had sob today.  Pt's back pain is stable.      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) 06/12/2019   NSTEMI (non-ST elevated myocardial infarction) ([place]) 01/11/2016   Uncontrolled diabetes mellitus ([place]) 01/11/2016   CAD (coronary artery disease) 01/21/2013   Chest pain 07/30/2012   Dyslipidemia 07/30/2012   HTN (hypertension) 07/30/2012    Past Surgical History:  Procedure Laterality Date   LUMBAR LAMINECTOMY FOR EPIDURAL ABSCESS N/A 12/21/2020   Procedure: Thoracic seven-eight, Thoracic twelve-Lumbar one, Lumbar three-four laminectomy for Epidural Abscess;  Surgeon: Karsten Ro, DO;  Location: [facility];  Service: Neurosurgery;  Laterality: N/A;       No family history on file.  Social History   Tobacco Use   Smoking status: Every Day    Packs/day: 2.00    Pack years: 0.00    Types: Cigarettes    Start date: 06/28/1995   Smokeless tobacco: Never  Vaping Use   Vaping Use: Never used  Substance Use Topics   Alcohol use: Not Currently   Drug use: Never    Home  Medications Prior to Admission medications   Medication Sig Start Date End Date Taking? Authorizing Provider  acetaminophen (TYLENOL) 325 MG tablet Take 2 tablets (650 mg total) by mouth every 6 (six) hours as needed for mild pain (or Fever >/= 101). 12/25/20   Little Ishikawa, MD  amLODipine (NORVASC) 2.5 MG tablet Take 2.5 mg by mouth every morning.    [provider]  atorvastatin (LIPITOR) 40 MG tablet Take 1 tablet (40 mg total) by mouth at bedtime. 12/25/20   Little Ishikawa, MD  dapagliflozin propanediol (FARXIGA) 10 MG TABS tablet Take 10 mg by mouth every morning.    [provider]  daptomycin (CUBICIN) IVPB Inject 750 mg into the vein daily. Indication:  MRSA bacteremia/lumbar osteomyelitis  First Dose: Yes Last Day of Therapy:  02/02/21 Labs - Once weekly:  CBC/D, BMP, and CPK Labs - Every other week:  ESR and CRP Method of administration: IV Push Method of administration may be changed at the discretion of home infusion pharmacist based upon assessment of the patient and/or caregiver's ability to self-administer the medication ordered. 12/25/20 02/04/21  Little Ishikawa, MD  diclofenac Sodium (VOLTAREN) 1 % GEL Apply 1 application topically 4 (four) times daily as needed (pain).    [provider]  furosemide (LASIX) 20 MG tablet Take 20 mg by mouth every morning.    [provider]  gabapentin (NEURONTIN) 100 MG  capsule Take 2 capsules (200 mg total) by mouth 3 (three) times daily. 12/25/20   Little Ishikawa, MD  glipiZIDE (GLUCOTROL) 5 MG tablet Take 5 mg by mouth 2 (two) times daily.    [provider]  icosapent Ethyl (VASCEPA) 1 g capsule Take 2 g by mouth 2 (two) times daily.    [provider]  lactulose (CHRONULAC) 10 GM/15ML solution Take 15 mLs (10 g total) by mouth daily. 12/26/20   Little Ishikawa, MD  lidocaine (LIDODERM) 5 % Place 1 patch onto the skin daily as needed (pain). Remove & Discard patch  within 12 hours or as directed by MD    [provider]  losartan (COZAAR) 25 MG tablet Take 25 mg by mouth every morning.    [provider]  metFORMIN (GLUCOPHAGE) 500 MG tablet Take 500-1,000 mg by mouth See admin instructions. Take 2 tablets (1000 mg) by mouth every morning and 1 tablet (500 mg) at night    [provider]  methocarbamol (ROBAXIN) 500 MG tablet Take 1 tablet (500 mg total) by mouth every 6 (six) hours as needed for muscle spasms. 12/25/20   Little Ishikawa, MD  metoprolol succinate (TOPROL-XL) 25 MG 24 hr tablet Take 25 mg by mouth at bedtime.    [provider]  omeprazole (PRILOSEC) 20 MG capsule Take 20 mg by mouth every morning.    [provider]  oxyCODONE (OXYCONTIN) 20 mg 12 hr tablet Take 1 tablet (20 mg total) by mouth every 12 (twelve) hours. 12/25/20   Little Ishikawa, MD  oxyCODONE (ROXICODONE) 15 MG immediate release tablet Take 1 tablet (15 mg total) by mouth every 3 (three) hours as needed for severe pain ((score 7 to 10)). 12/25/20   Little Ishikawa, MD  polyethylene glycol (MIRALAX / GLYCOLAX) 17 g packet Take 17 g by mouth 2 (two) times daily. 12/25/20   Little Ishikawa, MD  venlafaxine XR (EFFEXOR-XR) 75 MG 24 hr capsule Take 75 mg by mouth every morning.    [provider]    Allergies    Patient has no known allergies.  Review of Systems   Review of Systems  Respiratory:  Positive for shortness of breath.   All other systems reviewed and are negative.  Physical Exam Updated Vital Signs BP 137/76   Pulse (!) 109   Temp 98.5 F (36.9 C) (Oral)   Resp (!) 21   SpO2 94%   Physical Exam Vitals and nursing note reviewed.  Constitutional:      Appearance: Normal appearance.  HENT:     Head: Normocephalic and atraumatic.     Right Ear: External ear normal.     Left Ear: External ear normal.     Nose: Nose normal.     Mouth/Throat:     Mouth: Mucous membranes are moist.      Pharynx: Oropharynx is clear.  Eyes:     Extraocular Movements: Extraocular movements intact.     Conjunctiva/sclera: Conjunctivae normal.     Pupils: Pupils are equal, round, and reactive to light.  Cardiovascular:     Rate and Rhythm: Normal rate and regular rhythm.     Pulses: Normal pulses.     Heart sounds: Normal heart sounds.  Pulmonary:     Effort: Pulmonary effort is normal.     Breath sounds: Normal breath sounds.  Abdominal:     General: Abdomen is flat. Bowel sounds are normal.     Palpations:  Abdomen is soft.  Musculoskeletal:        General: Normal range of motion.     Cervical back: Normal range of motion and neck supple.  Skin:    General: Skin is warm.     Capillary Refill: Capillary refill takes less than 2 seconds.  Neurological:     General: No focal deficit present.     Mental Status: He is alert and oriented to person, place, and time.  Psychiatric:        Mood and Affect: Mood normal.        Behavior: Behavior normal.    ED Results / Procedures / Treatments   Labs (all labs ordered are listed, but only abnormal results are displayed) Labs Reviewed  BASIC METABOLIC PANEL - Abnormal; Notable for the following components:      Result Value   Sodium 124 (*)    Potassium 3.4 (*)    Chloride 86 (*)    Glucose, Bld 151 (*)    Calcium 7.8 (*)    All other components within normal limits  CBC WITH DIFFERENTIAL/PLATELET - Abnormal; Notable for the following components:   WBC 22.1 (*)    RBC 3.76 (*)    Hemoglobin 11.0 (*)    HCT 32.8 (*)    Platelets 574 (*)    Neutro Abs 18.8 (*)    Monocytes Absolute 1.4 (*)    Abs Immature Granulocytes 0.18 (*)    All other components within normal limits  BRAIN NATRIURETIC PEPTIDE - Abnormal; Notable for the following components:   B Natriuretic Peptide 303.5 (*)    All other components within normal limits    EKG EKG Interpretation  Date/Time:  Wednesday December 29 2020 20:17:37 EDT Ventricular Rate:  104 PR  Interval:  176 QRS Duration: 114 QT Interval:  367 QTC Calculation: 483 R Axis:   77 Text Interpretation: Sinus tachycardia Inferior infarct, old No significant change since last tracing Confirmed by Isla Pence [phone]) on 12/29/2020 8:28:07 PM  Radiology CT Angio Chest PE W and/or Wo Contrast  Result Date: 12/29/2020 CLINICAL DATA:  PE suspected, high prob Recent back surgery, discharged 3 days ago. Electronic records lists lumbar laminectomy for epidural abscess on 12/21/2020. EXAM: CT ANGIOGRAPHY CHEST WITH CONTRAST TECHNIQUE: Multidetector CT imaging of the chest was performed using the standard protocol during bolus administration of intravenous contrast. Multiplanar CT image reconstructions and MIPs were obtained to evaluate the vascular anatomy. CONTRAST:  11mL OMNIPAQUE IOHEXOL 350 MG/ML SOLN COMPARISON:  Radiograph earlier today. Chest CT 01/05/2014. Thoracic MRI 12/21/2020 FINDINGS: Cardiovascular: There are no filling defects within the pulmonary arteries to suggest pulmonary embolus. The subsegmental branches are not well assessed due to contrast bolus timing, particularly in the lower lobes where there is also breathing motion artifact. Right upper extremity PICC tip in the SVC. Mild aortic atherosclerosis. Post median sternotomy. Heart is normal in size. No pericardial effusion. Mediastinum/Nodes: Prominent mediastinal lymph nodes measuring 10-11 mm in the anterior paratracheal and prevascular station. No hilar adenopathy. Small bilateral axillary nodes. No esophageal wall thickening. No thyroid nodule. Lungs/Pleura: Small to moderate left and small right pleural effusion. There is adjacent compressive atelectasis. 4 mm right middle lobe pulmonary nodule, series 6, image 80. This is slightly larger than on prior exam. 4 mm subpleural nodule in the left lower lobe, series 6, image 72, new from prior. There additional small perifissural and subpleural nodules. Mild heterogeneous pulmonary  parenchyma. No pneumothorax. Upper Abdomen: No acute findings. Musculoskeletal: Sclerosis  at T7-T8 vertebral bodies with interval T7 laminectomy. Small amount of gas in the soft tissues and operative bed, as well as the spinal canal at this level. There is anterior paravertebral soft tissue thickening/phlegmon. Left T12 laminectomy with small amount of gas in the operative bed. Associated paravertebral soft tissue thickening is only partially included in the field of view upper abdominal flank subcutaneous edema. Review of the MIP images confirms the above findings. IMPRESSION: 1. No pulmonary embolus. 2. Small to moderate left and small right pleural effusions with adjacent compressive atelectasis. 3. Recent T7-T8 discitis osteomyelitis with epidural abscess and surgical debridement. Interval T7 laminectomy. Small amount of gas in the soft tissues and operative bed, as well as the spinal canal at this level. There is anterior paravertebral soft tissue thickening/phlegmon, also seen on prior MRI. If there is clinical concern for persistent or recurrent spinal infection, MRI is recommended. 4. Mild heterogeneous pulmonary parenchyma, likely small airways disease. 5. Small pulmonary nodules, largest measuring 4 mm in the right middle lobe, slightly larger than on prior 2015 exam. In a high-risk patient, recommend follow-up in 1 year. No further follow-up is needed in a low risk patient. 6. Prominent mediastinal lymph nodes are likely reactive. Aortic Atherosclerosis (ICD10-I70.0). Electronically Signed   By: Keith Rake M.D.   On: 12/29/2020 22:49   DG Chest Port 1 View  Result Date: 12/29/2020 CLINICAL DATA:  Shortness of breath EXAM: PORTABLE CHEST 1 VIEW COMPARISON:  12/12/2020 FINDINGS: Postoperative changes in the mediastinum. Right PICC line with tip over the cavoatrial junction region. Heart size and pulmonary vascularity are normal. Lungs are clear. No pleural effusions. No pneumothorax. Mediastinal  contours appear intact. IMPRESSION: No active disease. Electronically Signed   By: Lucienne Capers M.D.   On: 12/29/2020 21:01    Procedures Procedures   Medications Ordered in ED Medications  sodium chloride 0.9 % bolus 1,000 mL (has no administration in time range)  iohexol (OMNIPAQUE) 350 MG/ML injection 50 mL (50 mLs Intravenous Contrast Given 12/29/20 2230)    ED Course  I have reviewed the triage vital signs and the nursing notes.  Pertinent labs & imaging results that were available during my care of the patient were reviewed by me and considered in my medical decision making (see chart for details).    MDM Rules/Calculators/A&P                          Pt's Na is low and WBC is elevated.  This is chronic.  O2 sats here have been nl.  Pt was able to ambulate with a walker with O2 sats greater than 95%.  Pt is given some IVFs for his tachycardia.  Pt is stable for d/c.  Return if worse.   Final Clinical Impression(s) / ED Diagnoses Final diagnoses:  Sinus tachycardia  Hyponatremia    Rx / DC Orders ED Discharge Orders     None        Isla Pence, MD 12/29/20 2328

## 2020-12-30 DIAGNOSIS — I2510 Atherosclerotic heart disease of native coronary artery without angina pectoris: Secondary | ICD-10-CM | POA: Diagnosis not present

## 2020-12-30 DIAGNOSIS — B9562 Methicillin resistant Staphylococcus aureus infection as the cause of diseases classified elsewhere: Secondary | ICD-10-CM | POA: Diagnosis not present

## 2020-12-30 DIAGNOSIS — M4626 Osteomyelitis of vertebra, lumbar region: Secondary | ICD-10-CM | POA: Diagnosis not present

## 2020-12-30 DIAGNOSIS — M4807 Spinal stenosis, lumbosacral region: Secondary | ICD-10-CM | POA: Diagnosis not present

## 2020-12-30 DIAGNOSIS — Z452 Encounter for adjustment and management of vascular access device: Secondary | ICD-10-CM | POA: Diagnosis not present

## 2020-12-30 DIAGNOSIS — E1169 Type 2 diabetes mellitus with other specified complication: Secondary | ICD-10-CM | POA: Diagnosis not present

## 2020-12-30 DIAGNOSIS — I119 Hypertensive heart disease without heart failure: Secondary | ICD-10-CM | POA: Diagnosis not present

## 2020-12-30 DIAGNOSIS — Z4789 Encounter for other orthopedic aftercare: Secondary | ICD-10-CM | POA: Diagnosis not present

## 2020-12-30 DIAGNOSIS — K6812 Psoas muscle abscess: Secondary | ICD-10-CM | POA: Diagnosis not present

## 2020-12-30 DIAGNOSIS — R7881 Bacteremia: Secondary | ICD-10-CM | POA: Diagnosis not present

## 2020-12-30 DIAGNOSIS — M4802 Spinal stenosis, cervical region: Secondary | ICD-10-CM | POA: Diagnosis not present

## 2020-12-30 DIAGNOSIS — M4627 Osteomyelitis of vertebra, lumbosacral region: Secondary | ICD-10-CM | POA: Diagnosis not present

## 2020-12-30 DIAGNOSIS — M48061 Spinal stenosis, lumbar region without neurogenic claudication: Secondary | ICD-10-CM | POA: Diagnosis not present

## 2020-12-30 DIAGNOSIS — M4804 Spinal stenosis, thoracic region: Secondary | ICD-10-CM | POA: Diagnosis not present

## 2020-12-30 DIAGNOSIS — G062 Extradural and subdural abscess, unspecified: Secondary | ICD-10-CM | POA: Diagnosis not present

## 2020-12-30 DIAGNOSIS — M4647 Discitis, unspecified, lumbosacral region: Secondary | ICD-10-CM | POA: Diagnosis not present

## 2020-12-30 DIAGNOSIS — M4646 Discitis, unspecified, lumbar region: Secondary | ICD-10-CM | POA: Diagnosis not present

## 2020-12-31 DIAGNOSIS — R7881 Bacteremia: Secondary | ICD-10-CM | POA: Diagnosis not present

## 2020-12-31 DIAGNOSIS — I25810 Atherosclerosis of coronary artery bypass graft(s) without angina pectoris: Secondary | ICD-10-CM | POA: Diagnosis not present

## 2020-12-31 DIAGNOSIS — M4626 Osteomyelitis of vertebra, lumbar region: Secondary | ICD-10-CM | POA: Diagnosis not present

## 2020-12-31 DIAGNOSIS — E1159 Type 2 diabetes mellitus with other circulatory complications: Secondary | ICD-10-CM | POA: Diagnosis not present

## 2021-01-01 DIAGNOSIS — R7881 Bacteremia: Secondary | ICD-10-CM | POA: Diagnosis not present

## 2021-01-02 DIAGNOSIS — R7881 Bacteremia: Secondary | ICD-10-CM | POA: Diagnosis not present

## 2021-01-03 DIAGNOSIS — R7881 Bacteremia: Secondary | ICD-10-CM | POA: Diagnosis not present

## 2021-01-04 DIAGNOSIS — G061 Intraspinal abscess and granuloma: Secondary | ICD-10-CM | POA: Diagnosis not present

## 2021-01-04 DIAGNOSIS — R7881 Bacteremia: Secondary | ICD-10-CM | POA: Diagnosis not present

## 2021-01-05 ENCOUNTER — Telehealth: Payer: Self-pay

## 2021-01-05 ENCOUNTER — Inpatient Hospital Stay (HOSPITAL_COMMUNITY)
Admission: EM | Admit: 2021-01-05 | Discharge: 2021-01-28 | DRG: 871 | Disposition: A | Discharge status: Rehabilitation Facility | Payer: Federal, State, Local not specified - PPO | Attending: Family Medicine | Admitting: Family Medicine

## 2021-01-05 ENCOUNTER — Emergency Department (HOSPITAL_COMMUNITY): Payer: Federal, State, Local not specified - PPO

## 2021-01-05 ENCOUNTER — Telehealth: Payer: Self-pay | Admitting: Infectious Disease

## 2021-01-05 DIAGNOSIS — I5032 Chronic diastolic (congestive) heart failure: Secondary | ICD-10-CM | POA: Diagnosis not present

## 2021-01-05 DIAGNOSIS — G061 Intraspinal abscess and granuloma: Secondary | ICD-10-CM | POA: Diagnosis not present

## 2021-01-05 DIAGNOSIS — A4102 Sepsis due to Methicillin resistant Staphylococcus aureus: Secondary | ICD-10-CM | POA: Diagnosis not present

## 2021-01-05 DIAGNOSIS — E1165 Type 2 diabetes mellitus with hyperglycemia: Secondary | ICD-10-CM | POA: Diagnosis not present

## 2021-01-05 DIAGNOSIS — R6520 Severe sepsis without septic shock: Secondary | ICD-10-CM | POA: Diagnosis not present

## 2021-01-05 DIAGNOSIS — M4626 Osteomyelitis of vertebra, lumbar region: Secondary | ICD-10-CM | POA: Diagnosis not present

## 2021-01-05 DIAGNOSIS — M4620 Osteomyelitis of vertebra, site unspecified: Secondary | ICD-10-CM | POA: Diagnosis not present

## 2021-01-05 DIAGNOSIS — G062 Extradural and subdural abscess, unspecified: Secondary | ICD-10-CM | POA: Diagnosis present

## 2021-01-05 DIAGNOSIS — Z978 Presence of other specified devices: Secondary | ICD-10-CM

## 2021-01-05 DIAGNOSIS — R9431 Abnormal electrocardiogram [ECG] [EKG]: Secondary | ICD-10-CM

## 2021-01-05 DIAGNOSIS — R52 Pain, unspecified: Secondary | ICD-10-CM | POA: Diagnosis not present

## 2021-01-05 DIAGNOSIS — Z20822 Contact with and (suspected) exposure to covid-19: Secondary | ICD-10-CM | POA: Diagnosis not present

## 2021-01-05 DIAGNOSIS — R0902 Hypoxemia: Secondary | ICD-10-CM | POA: Diagnosis not present

## 2021-01-05 DIAGNOSIS — M4624 Osteomyelitis of vertebra, thoracic region: Secondary | ICD-10-CM | POA: Diagnosis present

## 2021-01-05 DIAGNOSIS — E871 Hypo-osmolality and hyponatremia: Secondary | ICD-10-CM | POA: Diagnosis not present

## 2021-01-05 DIAGNOSIS — Z95828 Presence of other vascular implants and grafts: Secondary | ICD-10-CM

## 2021-01-05 DIAGNOSIS — M62838 Other muscle spasm: Secondary | ICD-10-CM | POA: Diagnosis present

## 2021-01-05 DIAGNOSIS — K6812 Psoas muscle abscess: Secondary | ICD-10-CM | POA: Diagnosis present

## 2021-01-05 DIAGNOSIS — D649 Anemia, unspecified: Secondary | ICD-10-CM | POA: Diagnosis present

## 2021-01-05 DIAGNOSIS — B370 Candidal stomatitis: Secondary | ICD-10-CM

## 2021-01-05 DIAGNOSIS — E1169 Type 2 diabetes mellitus with other specified complication: Secondary | ICD-10-CM | POA: Diagnosis present

## 2021-01-05 DIAGNOSIS — M4645 Discitis, unspecified, thoracolumbar region: Secondary | ICD-10-CM | POA: Diagnosis not present

## 2021-01-05 DIAGNOSIS — G9341 Metabolic encephalopathy: Secondary | ICD-10-CM | POA: Diagnosis not present

## 2021-01-05 DIAGNOSIS — Z6828 Body mass index (BMI) 28.0-28.9, adult: Secondary | ICD-10-CM

## 2021-01-05 DIAGNOSIS — M549 Dorsalgia, unspecified: Secondary | ICD-10-CM | POA: Diagnosis not present

## 2021-01-05 DIAGNOSIS — E43 Unspecified severe protein-calorie malnutrition: Secondary | ICD-10-CM | POA: Diagnosis not present

## 2021-01-05 DIAGNOSIS — E785 Hyperlipidemia, unspecified: Secondary | ICD-10-CM | POA: Diagnosis present

## 2021-01-05 DIAGNOSIS — M48061 Spinal stenosis, lumbar region without neurogenic claudication: Secondary | ICD-10-CM | POA: Diagnosis present

## 2021-01-05 DIAGNOSIS — E861 Hypovolemia: Secondary | ICD-10-CM | POA: Diagnosis present

## 2021-01-05 DIAGNOSIS — I110 Hypertensive heart disease with heart failure: Secondary | ICD-10-CM | POA: Diagnosis not present

## 2021-01-05 DIAGNOSIS — K219 Gastro-esophageal reflux disease without esophagitis: Secondary | ICD-10-CM

## 2021-01-05 DIAGNOSIS — M546 Pain in thoracic spine: Secondary | ICD-10-CM | POA: Diagnosis not present

## 2021-01-05 DIAGNOSIS — D6489 Other specified anemias: Secondary | ICD-10-CM | POA: Diagnosis present

## 2021-01-05 DIAGNOSIS — F419 Anxiety disorder, unspecified: Secondary | ICD-10-CM | POA: Diagnosis present

## 2021-01-05 DIAGNOSIS — E876 Hypokalemia: Secondary | ICD-10-CM | POA: Diagnosis not present

## 2021-01-05 DIAGNOSIS — K7200 Acute and subacute hepatic failure without coma: Secondary | ICD-10-CM | POA: Diagnosis not present

## 2021-01-05 DIAGNOSIS — B9562 Methicillin resistant Staphylococcus aureus infection as the cause of diseases classified elsewhere: Secondary | ICD-10-CM | POA: Diagnosis present

## 2021-01-05 DIAGNOSIS — I2510 Atherosclerotic heart disease of native coronary artery without angina pectoris: Secondary | ICD-10-CM | POA: Diagnosis present

## 2021-01-05 DIAGNOSIS — W19XXXA Unspecified fall, initial encounter: Secondary | ICD-10-CM

## 2021-01-05 DIAGNOSIS — A419 Sepsis, unspecified organism: Secondary | ICD-10-CM

## 2021-01-05 DIAGNOSIS — E119 Type 2 diabetes mellitus without complications: Secondary | ICD-10-CM

## 2021-01-05 DIAGNOSIS — R531 Weakness: Secondary | ICD-10-CM | POA: Diagnosis not present

## 2021-01-05 DIAGNOSIS — K831 Obstruction of bile duct: Secondary | ICD-10-CM | POA: Diagnosis not present

## 2021-01-05 DIAGNOSIS — G9389 Other specified disorders of brain: Secondary | ICD-10-CM

## 2021-01-05 DIAGNOSIS — E872 Acidosis: Secondary | ICD-10-CM | POA: Diagnosis present

## 2021-01-05 DIAGNOSIS — D75839 Thrombocytosis, unspecified: Secondary | ICD-10-CM

## 2021-01-05 DIAGNOSIS — G8929 Other chronic pain: Secondary | ICD-10-CM | POA: Diagnosis present

## 2021-01-05 DIAGNOSIS — M4640 Discitis, unspecified, site unspecified: Secondary | ICD-10-CM

## 2021-01-05 DIAGNOSIS — R4189 Other symptoms and signs involving cognitive functions and awareness: Secondary | ICD-10-CM | POA: Diagnosis present

## 2021-01-05 DIAGNOSIS — R7881 Bacteremia: Secondary | ICD-10-CM | POA: Diagnosis not present

## 2021-01-05 DIAGNOSIS — I10 Essential (primary) hypertension: Secondary | ICD-10-CM | POA: Diagnosis not present

## 2021-01-05 DIAGNOSIS — M545 Low back pain, unspecified: Secondary | ICD-10-CM | POA: Diagnosis not present

## 2021-01-05 DIAGNOSIS — Z4659 Encounter for fitting and adjustment of other gastrointestinal appliance and device: Secondary | ICD-10-CM

## 2021-01-05 DIAGNOSIS — G9340 Encephalopathy, unspecified: Secondary | ICD-10-CM | POA: Diagnosis not present

## 2021-01-05 DIAGNOSIS — R32 Unspecified urinary incontinence: Secondary | ICD-10-CM | POA: Diagnosis present

## 2021-01-05 DIAGNOSIS — M4649 Discitis, unspecified, multiple sites in spine: Secondary | ICD-10-CM | POA: Diagnosis present

## 2021-01-05 DIAGNOSIS — F17210 Nicotine dependence, cigarettes, uncomplicated: Secondary | ICD-10-CM | POA: Diagnosis present

## 2021-01-05 DIAGNOSIS — E8729 Other acidosis: Secondary | ICD-10-CM

## 2021-01-05 DIAGNOSIS — Z0189 Encounter for other specified special examinations: Secondary | ICD-10-CM

## 2021-01-05 DIAGNOSIS — Z951 Presence of aortocoronary bypass graft: Secondary | ICD-10-CM

## 2021-01-05 DIAGNOSIS — Z794 Long term (current) use of insulin: Secondary | ICD-10-CM | POA: Diagnosis not present

## 2021-01-05 LAB — CBC WITH DIFFERENTIAL/PLATELET
Abs Immature Granulocytes: 0.58 10*3/uL — ABNORMAL HIGH (ref 0.00–0.07)
Basophils Absolute: 0.1 10*3/uL (ref 0.0–0.1)
Basophils Relative: 0 %
Eosinophils Absolute: 0 10*3/uL (ref 0.0–0.5)
Eosinophils Relative: 0 %
HCT: 25.9 % — ABNORMAL LOW (ref 39.0–52.0)
Hemoglobin: 8.6 g/dL — ABNORMAL LOW (ref 13.0–17.0)
Immature Granulocytes: 2 %
Lymphocytes Relative: 6 %
Lymphs Abs: 1.4 10*3/uL (ref 0.7–4.0)
MCH: 29.3 pg (ref 26.0–34.0)
MCHC: 33.2 g/dL (ref 30.0–36.0)
MCV: 88.1 fL (ref 80.0–100.0)
Monocytes Absolute: 1.4 10*3/uL — ABNORMAL HIGH (ref 0.1–1.0)
Monocytes Relative: 6 %
Neutro Abs: 20.5 10*3/uL — ABNORMAL HIGH (ref 1.7–7.7)
Neutrophils Relative %: 86 %
Platelets: 681 10*3/uL — ABNORMAL HIGH (ref 150–400)
RBC: 2.94 MIL/uL — ABNORMAL LOW (ref 4.22–5.81)
RDW: 15.2 % (ref 11.5–15.5)
WBC: 23.9 10*3/uL — ABNORMAL HIGH (ref 4.0–10.5)
nRBC: 0 % (ref 0.0–0.2)

## 2021-01-05 LAB — BASIC METABOLIC PANEL
Anion gap: 14 (ref 5–15)
BUN: 7 mg/dL (ref 6–20)
CO2: 24 mmol/L (ref 22–32)
Calcium: 8 mg/dL — ABNORMAL LOW (ref 8.9–10.3)
Chloride: 91 mmol/L — ABNORMAL LOW (ref 98–111)
Creatinine, Ser: 0.74 mg/dL (ref 0.61–1.24)
GFR, Estimated: >60 mL/min (ref >60)
Glucose, Bld: 104 mg/dL — ABNORMAL HIGH (ref 70–99)
Potassium: 4.3 mmol/L (ref 3.5–5.1)
Sodium: 129 mmol/L — ABNORMAL LOW (ref 135–145)

## 2021-01-05 LAB — RESP PANEL BY RT-PCR (FLU A&B, COVID) ARPGX2
Influenza A by PCR: NEGATIVE
Influenza B by PCR: NEGATIVE
SARS Coronavirus 2 by RT PCR: NEGATIVE

## 2021-01-05 LAB — SEDIMENTATION RATE: Sed Rate: 26 mm/hr — ABNORMAL HIGH (ref 0–16)

## 2021-01-05 MED ORDER — HYDROMORPHONE HCL 1 MG/ML IJ SOLN
1.0000 mg | Freq: Once | INTRAMUSCULAR | Status: AC
  Administered 2021-01-05: 1 mg via INTRAVENOUS
  Filled 2021-01-05: qty 1

## 2021-01-05 MED ORDER — GADOBUTROL 1 MMOL/ML IV SOLN
9.0000 mL | Freq: Once | INTRAVENOUS | Status: AC | PRN
  Administered 2021-01-05: 9 mL via INTRAVENOUS

## 2021-01-05 MED ORDER — HYDROMORPHONE HCL 1 MG/ML IJ SOLN
1.0000 mg | Freq: Once | INTRAMUSCULAR | Status: AC
Start: 2021-01-05 — End: 2021-01-05
  Administered 2021-01-05: 1 mg via INTRAVENOUS
  Filled 2021-01-05: qty 1

## 2021-01-05 MED ORDER — HYDROMORPHONE HCL 1 MG/ML IJ SOLN
1.0000 mg | Freq: Once | INTRAMUSCULAR | Status: AC | PRN
  Administered 2021-01-06: 1 mg via INTRAVENOUS
  Filled 2021-01-05: qty 1

## 2021-01-05 MED ORDER — LORAZEPAM 1 MG PO TABS
0.5000 mg | ORAL_TABLET | Freq: Once | ORAL | Status: AC
  Administered 2021-01-05: 0.5 mg via ORAL
  Filled 2021-01-05: qty 1

## 2021-01-05 NOTE — Telephone Encounter (Signed)
Attempted to call patient to reschedule the appointment he has scheduled for 7/29 with Dr. Renold Don for a earlier date, has to be a appointment. Was going to offer an appointment for 01/11/21 at 10am with Dr. Luciana Axe if still available.

## 2021-01-05 NOTE — Telephone Encounter (Signed)
Received call from patient's home health team reporting a preliminary WBC count of 26.6. Awaiting fax with confirmatory findings.   Forwarding to provider and pharmacy team. Patient is currently on dapto 750mg  daily until 8/12. Patient scheduled to see Dr. 10/12 on 01/21/2021  01/23/2021, RN

## 2021-01-05 NOTE — Telephone Encounter (Signed)
Notified that patients WBC is 28.6 K 22.7 PMN  This is actually not much different than many of his other WBC going back past 3 weeks  Perhaps he can be rea-assessed in clniic sooner than his planned appt and consider repeat imaging of spine  CBC Latest Ref Rng & Units 12/29/2020 12/26/2020 12/25/2020  WBC 4.0 - 10.5 K/uL 22.1(H) 26.9(H) 24.6(H)  Hemoglobin 13.0 - 17.0 g/dL 11.0(L) 11.4(L) 13.4  Hematocrit 39.0 - 52.0 % 32.8(L) 34.0(L) 39.3  Platelets 150 - 400 K/uL 574(H) 580(H) 595(H)

## 2021-01-05 NOTE — ED Notes (Signed)
Pt not there for vitals

## 2021-01-05 NOTE — ED Triage Notes (Signed)
Patient coming in via EMS.  Patient had back surgery x 2 weeks ago and has been complaining of increased pain in the back x 2 days.  Patient states at 12:00 today he took an Oxycodone.  Patients back on assessment has redness and swelling to the left side.

## 2021-01-05 NOTE — Consult Note (Signed)
Reason for Consult:epidural abscess Referring Physician: EDP  Louis [name] is an 55 y.o. male.   HPI:  55 year old male presented to the ED tonight with progressive leg pain that has gotten worse over the last two days. He had thoracic and lumbar laminectomies on 6/28 for epidural abscess. He was sent home on IV daptomycin. Reports having 4 episodes of incontinence over the last 4 days. Denies any saddle anesthesia or numbness and tingling in his legs. Reports back and leg pain that is is "all over" the front and back of his legs. Rates his pain an 8/10, constant and achey. Denies any fever or drainage from his wound. Missed his daptomycin dose yesterday because it didn't arrive in time.   Past Medical History:  Diagnosis Date   CAD (coronary artery disease)    DM (diabetes mellitus) (HCC)    Hyperlipidemia    Hypertension     Past Surgical History:  Procedure Laterality Date   LUMBAR LAMINECTOMY FOR EPIDURAL ABSCESS N/A 12/21/2020   Procedure: Thoracic seven-eight, Thoracic twelve-Lumbar one, Lumbar three-four laminectomy for Epidural Abscess;  Surgeon: Dawley, Alan Mulder, DO;  Location: [facility] OR;  Service: Neurosurgery;  Laterality: N/A;    No Known Allergies  Social History   Tobacco Use   Smoking status: Every Day    Packs/day: 2.00    Types: Cigarettes    Start date: 06/28/1995   Smokeless tobacco: Never  Substance Use Topics   Alcohol use: Not Currently    No family history on file.   Review of Systems  Positive ROS: as above   All other systems have been reviewed and were otherwise negative with the exception of those mentioned in the HPI and as above.  Objective: Vital signs in last 24 hours: Pulse Rate:  [106-111] 111 (07/13 2133) Resp:  [17-20] 20 (07/13 2133) BP: (141-155)/(77-94) 141/80 (07/13 2133) SpO2:  [93 %-97 %] 97 % (07/13 2133)  General Appearance: Alert, cooperative, no distress, appears stated age Head: Normocephalic, without obvious abnormality,  atraumatic Eyes: PERRL, conjunctiva/corneas clear, EOM's intact, fundi benign, both eyes      Back: Symmetric, no curvature, ROM normal, no CVA tenderness, incision cdi  Lungs: respirations unlabored Heart: Regular rate and rhythm Extremities: Extremities normal, atraumatic, no cyanosis or edema Pulses: 2+ and symmetric all extremities Skin: Skin color, texture, turgor normal, no rashes or lesions  NEUROLOGIC:   Mental status: A&O x4, no aphasia, good attention span, Memory and fund of knowledge Motor Exam - grossly normal, normal tone and bulk Sensory Exam - grossly normal Reflexes: symmetric, no pathologic reflexes, No Hoffman's, No clonus Coordination - grossly normal Gait -not tested Balance - not tested Cranial Nerves: I: smell Not tested  II: visual acuity  OS: na    OD: na  II: visual fields Full to confrontation  II: pupils Equal, round, reactive to light  III,VII: ptosis None  III,IV,VI: extraocular muscles  Full ROM  V: mastication   V: facial light touch sensation    V,VII: corneal reflex    VII: facial muscle function - upper    VII: facial muscle function - lower   VIII: hearing   IX: soft palate elevation    IX,X: gag reflex   XI: trapezius strength    XI: sternocleidomastoid strength   XI: neck flexion strength    XII: tongue strength      Data Review Lab Results  Component Value Date   WBC 23.9 (H) 01/05/2021   HGB 8.6 (L)  01/05/2021   HCT 25.9 (L) 01/05/2021   MCV 88.1 01/05/2021   PLT 681 (H) 01/05/2021   Lab Results  Component Value Date   NA 129 (L) 01/05/2021   K 4.3 01/05/2021   CL 91 (L) 01/05/2021   CO2 24 01/05/2021   BUN 7 01/05/2021   CREATININE 0.74 01/05/2021   GLUCOSE 104 (H) 01/05/2021   Lab Results  Component Value Date   INR 1.3 (H) 12/12/2020    Radiology: MR THORACIC SPINE W WO CONTRAST  Result Date: 01/05/2021 CLINICAL DATA:  Spine surgery 2 weeks ago. Increasing pain for the last 2 days. Redness and swelling to the  operative site. EXAM: MRI THORACIC AND LUMBAR SPINE WITHOUT AND WITH CONTRAST TECHNIQUE: Multiplanar and multiecho pulse sequences of the thoracic and lumbar spine were obtained without and with intravenous contrast. CONTRAST:  48mL GADAVIST GADOBUTROL 1 MMOL/ML IV SOLN COMPARISON:  None. FINDINGS: MRI THORACIC SPINE FINDINGS Alignment:  No significant listhesis is present. Vertebrae: Diffuse T2 hyperintensity enhancement throughout the T7 and T8 vertebral bodies has increased. Patient is status post T7 laminectomy. Focal enhancement is again noted at the T4 vertebral body. Cord: Cord is displaced posteriorly by a epidural fluid T6-7. Cord size and signal are otherwise normal. Paraspinal and other soft tissues: Posterior epidural fluid collection is partially decompressed. It extends superiorly to the T5-6 level similar the prior exam. There is less mass effect posteriorly than on the previous study. Collection extends inferiorly to the T9 level. There is an extensive ventral collection extending into the lumbar spine. Abnormal paraspinous soft tissue enhancement extends from T3-4 to T9-10. No drainable collection is present. There is a peripherally enhancing fluid collection the operative bed which measures 6.1 x 1.0 x 2.1 cm. Disc levels: No focal disc disease is present. Abnormal enhancement is present in the foramina on the left T4-5, T5-6, T6-7 T7-8, and T8-9. Right foraminal enhancement is less extensive predominantly involving T6-7, T7-8 T8-9. MRI LUMBAR SPINE FINDINGS Segmentation: 5 non rib-bearing lumbar type vertebral bodies are present. The lowest fully formed vertebral body is L5. Alignment:  No significant listhesis is present. Vertebrae: Progressive signal abnormality and enhancement is present in the vertebral bodies, predominantly at L3 and L4. There is abnormal disc signal without definite disc enhancement at L3-4 and L4-5. There is heterogeneous signal and enhancement posteriorly at L5-S1 which may  be related to degenerative change. Conus medullaris: Extends to the L1-2 level and appears normal. Paraspinal and other soft tissues: Patient is status post left laminectomy at L3. Fluid tracks from the laminectomy site into a subcutaneous and deep soft tissue collection with peripheral enhancement measuring 4.7 x 5.0 x 1.7 cm. The ventral epidural collection extends inferiorly to the L2 level. Separate prominent ventral collection is present posterior to L4 prominently right the left. This collection measures 2.9 x 1.3 cm on the sagittal images. Disc levels: In addition to the epidural collections, disc disease is again noted at L4-5 and L5-S1. Central foraminal narrowing are associated. IMPRESSION: 1. T7 laminectomy subcutaneous peripherally enhancing fluid collection at this level as well. 2. Residual posterior epidural fluid collection in the thoracic spine with similar cephalo caudad extension from T5-6-T9 but less mass effect. 3. Progressive signal abnormality and enhancement throughout the T7 and T8 vertebral bodies in the T7-8 thoracic disc consistent with disc osteomyelitis. 4. Progressive paraspinous soft tissue enhancement at T3-4 through T9-10 without drainable collection. 5. Persistent extensive ventral epidural collection compatible with abscess. 6. Status post left laminectomy at L3  with peripherally enhancing fluid collection extending from the operative site into the subcutaneous and deep tissues as described. This is concerning abscess. 7. Fluid tracks from the laminectomy site into a subdural deep soft tissue collection measuring 4.7 x 5.0 x 1.7 cm. 8. Separate ventral epidural collection at L4-5 and L5-S1 consistent with epidural abscess. 9. Progressive signal abnormality and enhancement in the L3 and L4 vertebral bodies. 10. Although there is not definite enhancement in the L4-5 and L5-S1 discs, progressive T2 signal and paravertebral soft tissue enhancement is highly concerning for progressive  disc osteomyelitis. Electronically Signed   By: Marin Robertshristopher  Mattern M.D.   On: 01/05/2021 21:24   MR Lumbar Spine W Wo Contrast  Result Date: 01/05/2021 CLINICAL DATA:  Spine surgery 2 weeks ago. Increasing pain for the last 2 days. Redness and swelling to the operative site. EXAM: MRI THORACIC AND LUMBAR SPINE WITHOUT AND WITH CONTRAST TECHNIQUE: Multiplanar and multiecho pulse sequences of the thoracic and lumbar spine were obtained without and with intravenous contrast. CONTRAST:  9mL GADAVIST GADOBUTROL 1 MMOL/ML IV SOLN COMPARISON:  None. FINDINGS: MRI THORACIC SPINE FINDINGS Alignment:  No significant listhesis is present. Vertebrae: Diffuse T2 hyperintensity enhancement throughout the T7 and T8 vertebral bodies has increased. Patient is status post T7 laminectomy. Focal enhancement is again noted at the T4 vertebral body. Cord: Cord is displaced posteriorly by a epidural fluid T6-7. Cord size and signal are otherwise normal. Paraspinal and other soft tissues: Posterior epidural fluid collection is partially decompressed. It extends superiorly to the T5-6 level similar the prior exam. There is less mass effect posteriorly than on the previous study. Collection extends inferiorly to the T9 level. There is an extensive ventral collection extending into the lumbar spine. Abnormal paraspinous soft tissue enhancement extends from T3-4 to T9-10. No drainable collection is present. There is a peripherally enhancing fluid collection the operative bed which measures 6.1 x 1.0 x 2.1 cm. Disc levels: No focal disc disease is present. Abnormal enhancement is present in the foramina on the left T4-5, T5-6, T6-7 T7-8, and T8-9. Right foraminal enhancement is less extensive predominantly involving T6-7, T7-8 T8-9. MRI LUMBAR SPINE FINDINGS Segmentation: 5 non rib-bearing lumbar type vertebral bodies are present. The lowest fully formed vertebral body is L5. Alignment:  No significant listhesis is present. Vertebrae:  Progressive signal abnormality and enhancement is present in the vertebral bodies, predominantly at L3 and L4. There is abnormal disc signal without definite disc enhancement at L3-4 and L4-5. There is heterogeneous signal and enhancement posteriorly at L5-S1 which may be related to degenerative change. Conus medullaris: Extends to the L1-2 level and appears normal. Paraspinal and other soft tissues: Patient is status post left laminectomy at L3. Fluid tracks from the laminectomy site into a subcutaneous and deep soft tissue collection with peripheral enhancement measuring 4.7 x 5.0 x 1.7 cm. The ventral epidural collection extends inferiorly to the L2 level. Separate prominent ventral collection is present posterior to L4 prominently right the left. This collection measures 2.9 x 1.3 cm on the sagittal images. Disc levels: In addition to the epidural collections, disc disease is again noted at L4-5 and L5-S1. Central foraminal narrowing are associated. IMPRESSION: 1. T7 laminectomy subcutaneous peripherally enhancing fluid collection at this level as well. 2. Residual posterior epidural fluid collection in the thoracic spine with similar cephalo caudad extension from T5-6-T9 but less mass effect. 3. Progressive signal abnormality and enhancement throughout the T7 and T8 vertebral bodies in the T7-8 thoracic disc consistent with  disc osteomyelitis. 4. Progressive paraspinous soft tissue enhancement at T3-4 through T9-10 without drainable collection. 5. Persistent extensive ventral epidural collection compatible with abscess. 6. Status post left laminectomy at L3 with peripherally enhancing fluid collection extending from the operative site into the subcutaneous and deep tissues as described. This is concerning abscess. 7. Fluid tracks from the laminectomy site into a subdural deep soft tissue collection measuring 4.7 x 5.0 x 1.7 cm. 8. Separate ventral epidural collection at L4-5 and L5-S1 consistent with epidural  abscess. 9. Progressive signal abnormality and enhancement in the L3 and L4 vertebral bodies. 10. 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 Electronically Signed   By: Marin Roberts M.D.   On: 01/05/2021 21:24     Assessment/Plan: 55 year old male presented to the ED tonight with episodes of incontinence and leg pain that has gotten worse over the last 4 days. MRI thoracic and lumbar spine showed several areas of fluid collection. The most prominent was at L4-5 which showed a ventral epidural abscess. Would recommend medicine admission, continue IV abx. NPO after midnight for L4-5 decompression tomorrow. I have gone over this plan of care with the patient and his wife who is at bedside. Answered all of their questions to the best of my ability.    Tiana Loft [facility] 01/05/2021 10:56 PM

## 2021-01-05 NOTE — ED Provider Notes (Signed)
[facility] EMERGENCY DEPARTMENT Provider Note   CSN: [id number] Arrival date & time: 01/05/21  1605     History Chief Complaint  Patient presents with   Back Pain    Louis [name] is a 55 y.o. male.  HPI     55 year old male comes in with chief complaint of back pain.  Patient has history of CAD, diabetes, hypertension and hyperlipidemia.  He was recently admitted to the hospital for epidural abscess, discitis, osteomyelitis and psoas abscess.  He is currently on daptomycin.  Patient states that he has been in pain since the surgery, but over the last 2 or 3 days his pain has gotten worse.  The pain medication he is taking is not helping him.  The pain is primarily in his back and radiates down his legs.  Wife reports that patient had about 4-5 episodes of urinary incontinence which is new.  Patient having hard time ambulating because of pain.  Denies any new numbness, tingling in the lower extremity, saddle anesthesia, urinary retention or bowel incontinence.  Review of system is negative for fevers, but patient has had sweats and chills.   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) 06/12/2019   NSTEMI (non-ST elevated myocardial infarction) ([place]) 01/11/2016   Uncontrolled diabetes mellitus ([place]) 01/11/2016   CAD (coronary artery disease) 01/21/2013   Chest pain 07/30/2012   Dyslipidemia 07/30/2012   HTN (hypertension) 07/30/2012    Past Surgical History:  Procedure Laterality Date    LUMBAR LAMINECTOMY FOR EPIDURAL ABSCESS N/A 12/21/2020   Procedure: Thoracic seven-eight, Thoracic twelve-Lumbar one, Lumbar three-four laminectomy for Epidural Abscess;  Surgeon: Karsten Ro, DO;  Location: [place];  Service: Neurosurgery;  Laterality: N/A;       No family history on file.  Social History   Tobacco Use   Smoking status: Every Day    Packs/day: 2.00    Types: Cigarettes    Start date: 06/28/1995   Smokeless tobacco: Never  Vaping Use   Vaping Use: Never used  Substance Use Topics   Alcohol use: Not Currently   Drug use: Never    Home Medications Prior to Admission medications   Medication Sig Start Date End Date Taking? Authorizing Provider  acetaminophen (TYLENOL) 325 MG tablet Take 2 tablets (650 mg total) by mouth every 6 (six) hours as needed for mild pain (or Fever >/= 101). 12/25/20   Little Ishikawa, MD  amLODipine (NORVASC) 2.5 MG tablet Take 2.5 mg by mouth every morning.    [provider]  atorvastatin (LIPITOR) 40 MG tablet Take 1 tablet (40 mg total) by mouth at bedtime. 12/25/20   Little Ishikawa, MD  dapagliflozin propanediol (FARXIGA) 10 MG TABS tablet Take 10 mg by mouth every morning.    [provider]  daptomycin (CUBICIN) IVPB Inject 750 mg into the vein daily. Indication:  MRSA bacteremia/lumbar osteomyelitis  First Dose: Yes Last Day of Therapy:  02/02/21 Labs - Once weekly:  CBC/D, BMP, and CPK  Labs - Every other week:  ESR and CRP Method of administration: IV Push Method of administration may be changed at the discretion of home infusion pharmacist based upon assessment of the patient and/or caregiver's ability to self-administer the medication ordered. 12/25/20 02/04/21  Little Ishikawa, MD  diclofenac Sodium (VOLTAREN) 1 % GEL Apply 1 application topically 4 (four) times daily as needed (pain).    [provider]  furosemide (LASIX) 20 MG tablet Take 20 mg by mouth every morning.    [provider]  gabapentin (NEURONTIN) 100 MG capsule Take 2 capsules (200 mg total) by mouth 3 (three) times daily. 12/25/20   Little Ishikawa, MD  glipiZIDE (GLUCOTROL) 5 MG tablet Take 5 mg by mouth 2 (two) times daily.    [provider]  icosapent Ethyl (VASCEPA) 1 g capsule Take 2 g by mouth 2 (two) times daily.    [provider]  lactulose (CHRONULAC) 10 GM/15ML solution Take 15 mLs (10 g total) by mouth daily. 12/26/20   Little Ishikawa, MD  lidocaine (LIDODERM) 5 % Place 1 patch onto the skin daily as needed (pain). Remove & Discard patch within 12 hours or as directed by MD    [provider]  losartan (COZAAR) 25 MG tablet Take 25 mg by mouth every morning.    [provider]  metFORMIN (GLUCOPHAGE) 500 MG tablet Take 500-1,000 mg by mouth See admin instructions. Take 2 tablets (1000 mg) by mouth every morning and 1 tablet (500 mg) at night    [provider]  methocarbamol (ROBAXIN) 500 MG tablet Take 1 tablet (500 mg total) by mouth every 6 (six) hours as needed for muscle spasms. 12/25/20   Little Ishikawa, MD  metoprolol succinate (TOPROL-XL) 25 MG 24 hr tablet Take 25 mg by mouth at bedtime.    [provider]  omeprazole (PRILOSEC) 20 MG capsule Take 20 mg by mouth every morning.    [provider]  oxyCODONE (OXYCONTIN) 20 mg 12 hr tablet Take 1 tablet (20 mg total) by mouth every 12 (twelve) hours. 12/25/20   Little Ishikawa, MD  oxyCODONE (ROXICODONE) 15 MG immediate release tablet Take 1 tablet (15 mg total) by mouth every 3 (three) hours as needed for severe pain ((score 7 to 10)). 12/25/20   Little Ishikawa, MD  polyethylene glycol (MIRALAX / GLYCOLAX) 17 g packet Take 17 g by mouth 2 (two) times daily. 12/25/20   Little Ishikawa, MD  venlafaxine XR (EFFEXOR-XR) 75 MG 24 hr capsule Take 75 mg by mouth every morning.    [provider]    Allergies    Patient has no known  allergies.  Review of Systems   Review of Systems  Constitutional:  Positive for activity change.  Musculoskeletal:  Positive for back pain.  Allergic/Immunologic: Negative for immunocompromised state.  Neurological:  Negative for weakness and numbness.  All other systems reviewed and are negative.  Physical Exam Updated Vital Signs BP (!) 141/80 (BP Location: Left Arm)   Pulse (!) 111   Resp 20   SpO2 97%   Physical Exam Vitals and nursing note reviewed.  Constitutional:      Appearance: He is well-developed. He is not diaphoretic.     Comments: Uncomfortable secondary to pain  HENT:     Head: Atraumatic.  Cardiovascular:     Rate and Rhythm: Tachycardia present.  Pulmonary:     Effort: Pulmonary effort is normal.  Musculoskeletal:  Cervical back: Neck supple.     Comments: Patient has 3 areas of surgical incision site over the thoracic and lumbar spine.  The incision sites look clean with some scabbing and surrounding erythema.  Patient has 2+ patellar reflex bilaterally.  Gross sensory exam is normal, patient able to discriminate between sharp and dull.  Patient has tenderness with passive leg raise, right worse than left.  Skin:    General: Skin is warm.  Neurological:     Mental Status: He is alert and oriented to person, place, and time.    ED Results / Procedures / Treatments   Labs (all labs ordered are listed, but only abnormal results are displayed) Labs Reviewed  BASIC METABOLIC PANEL - Abnormal; Notable for the following components:      Result Value   Sodium 129 (*)    Chloride 91 (*)    Glucose, Bld 104 (*)    Calcium 8.0 (*)    All other components within normal limits  CBC WITH DIFFERENTIAL/PLATELET - Abnormal; Notable for the following components:   WBC 23.9 (*)    RBC 2.94 (*)    Hemoglobin 8.6 (*)    HCT 25.9 (*)    Platelets 681 (*)    Neutro Abs 20.5 (*)    Monocytes Absolute 1.4 (*)    Abs Immature Granulocytes 0.58 (*)    All  other components within normal limits  SEDIMENTATION RATE - Abnormal; Notable for the following components:   Sed Rate 26 (*)    All other components within normal limits  CULTURE, BLOOD (ROUTINE X 2)  CULTURE, BLOOD (ROUTINE X 2)  RESP PANEL BY RT-PCR (FLU A&B, COVID) ARPGX2  C-REACTIVE PROTEIN    EKG None  Radiology MR THORACIC SPINE W WO CONTRAST  Result Date: 01/05/2021 CLINICAL DATA:  Spine surgery 2 weeks ago. Increasing pain for the last 2 days. Redness and swelling to the operative site. EXAM: MRI THORACIC AND LUMBAR SPINE WITHOUT AND WITH CONTRAST TECHNIQUE: Multiplanar and multiecho pulse sequences of the thoracic and lumbar spine were obtained without and with intravenous contrast. CONTRAST:  45mL GADAVIST GADOBUTROL 1 MMOL/ML IV SOLN COMPARISON:  None. FINDINGS: MRI THORACIC SPINE FINDINGS Alignment:  No significant listhesis is present. Vertebrae: Diffuse T2 hyperintensity enhancement throughout the T7 and T8 vertebral bodies has increased. Patient is status post T7 laminectomy. Focal enhancement is again noted at the T4 vertebral body. Cord: Cord is displaced posteriorly by a epidural fluid T6-7. Cord size and signal are otherwise normal. Paraspinal and other soft tissues: Posterior epidural fluid collection is partially decompressed. It extends superiorly to the T5-6 level similar the prior exam. There is less mass effect posteriorly than on the previous study. Collection extends inferiorly to the T9 level. There is an extensive ventral collection extending into the lumbar spine. Abnormal paraspinous soft tissue enhancement extends from T3-4 to T9-10. No drainable collection is present. There is a peripherally enhancing fluid collection the operative bed which measures 6.1 x 1.0 x 2.1 cm. Disc levels: No focal disc disease is present. Abnormal enhancement is present in the foramina on the left T4-5, T5-6, T6-7 T7-8, and T8-9. Right foraminal enhancement is less extensive predominantly  involving T6-7, T7-8 T8-9. MRI LUMBAR SPINE FINDINGS Segmentation: 5 non rib-bearing lumbar type vertebral bodies are present. The lowest fully formed vertebral body is L5. Alignment:  No significant listhesis is present. Vertebrae: Progressive signal abnormality and enhancement is present in the vertebral bodies, predominantly at L3 and L4. There is  abnormal disc signal without definite disc enhancement at L3-4 and L4-5. There is heterogeneous signal and enhancement posteriorly at L5-S1 which may be related to degenerative change. Conus medullaris: Extends to the L1-2 level and appears normal. Paraspinal and other soft tissues: Patient is status post left laminectomy at L3. Fluid tracks from the laminectomy site into a subcutaneous and deep soft tissue collection with peripheral enhancement measuring 4.7 x 5.0 x 1.7 cm. The ventral epidural collection extends inferiorly to the L2 level. Separate prominent ventral collection is present posterior to L4 prominently right the left. This collection measures 2.9 x 1.3 cm on the sagittal images. Disc levels: In addition to the epidural collections, disc disease is again noted at L4-5 and L5-S1. Central foraminal narrowing are associated. IMPRESSION: 1. T7 laminectomy subcutaneous peripherally enhancing fluid collection at this level as well. 2. Residual posterior epidural fluid collection in the thoracic spine with similar cephalo caudad extension from T5-6-T9 but less mass effect. 3. Progressive signal abnormality and enhancement throughout the T7 and T8 vertebral bodies in the T7-8 thoracic disc consistent with disc osteomyelitis. 4. Progressive paraspinous soft tissue enhancement at T3-4 through T9-10 without drainable collection. 5. Persistent extensive ventral epidural collection compatible with abscess. 6. Status post left laminectomy at L3 with peripherally enhancing fluid collection extending from the operative site into the subcutaneous and deep tissues as  described. This is concerning abscess. 7. Fluid tracks from the laminectomy site into a subdural deep soft tissue collection measuring 4.7 x 5.0 x 1.7 cm. 8. Separate ventral epidural collection at L4-5 and L5-S1 consistent with epidural abscess. 9. Progressive signal abnormality and enhancement in the L3 and L4 vertebral bodies. 10. 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 Electronically Signed   By: San Morelle M.D.   On: 01/05/2021 21:24   MR Lumbar Spine W Wo Contrast  Result Date: 01/05/2021 CLINICAL DATA:  Spine surgery 2 weeks ago. Increasing pain for the last 2 days. Redness and swelling to the operative site. EXAM: MRI THORACIC AND LUMBAR SPINE WITHOUT AND WITH CONTRAST TECHNIQUE: Multiplanar and multiecho pulse sequences of the thoracic and lumbar spine were obtained without and with intravenous contrast. CONTRAST:  22mL GADAVIST GADOBUTROL 1 MMOL/ML IV SOLN COMPARISON:  None. FINDINGS: MRI THORACIC SPINE FINDINGS Alignment:  No significant listhesis is present. Vertebrae: Diffuse T2 hyperintensity enhancement throughout the T7 and T8 vertebral bodies has increased. Patient is status post T7 laminectomy. Focal enhancement is again noted at the T4 vertebral body. Cord: Cord is displaced posteriorly by a epidural fluid T6-7. Cord size and signal are otherwise normal. Paraspinal and other soft tissues: Posterior epidural fluid collection is partially decompressed. It extends superiorly to the T5-6 level similar the prior exam. There is less mass effect posteriorly than on the previous study. Collection extends inferiorly to the T9 level. There is an extensive ventral collection extending into the lumbar spine. Abnormal paraspinous soft tissue enhancement extends from T3-4 to T9-10. No drainable collection is present. There is a peripherally  enhancing fluid collection the operative bed which measures 6.1 x 1.0 x 2.1 cm. Disc levels: No focal disc disease is present. Abnormal enhancement is present in the foramina on the left T4-5, T5-6, T6-7 T7-8, and T8-9. Right foraminal enhancement is less extensive predominantly involving T6-7, T7-8 T8-9. MRI LUMBAR SPINE FINDINGS Segmentation: 5 non rib-bearing lumbar type vertebral bodies are present. The lowest fully  formed vertebral body is L5. Alignment:  No significant listhesis is present. Vertebrae: Progressive signal abnormality and enhancement is present in the vertebral bodies, predominantly at L3 and L4. There is abnormal disc signal without definite disc enhancement at L3-4 and L4-5. There is heterogeneous signal and enhancement posteriorly at L5-S1 which may be related to degenerative change. Conus medullaris: Extends to the L1-2 level and appears normal. Paraspinal and other soft tissues: Patient is status post left laminectomy at L3. Fluid tracks from the laminectomy site into a subcutaneous and deep soft tissue collection with peripheral enhancement measuring 4.7 x 5.0 x 1.7 cm. The ventral epidural collection extends inferiorly to the L2 level. Separate prominent ventral collection is present posterior to L4 prominently right the left. This collection measures 2.9 x 1.3 cm on the sagittal images. Disc levels: In addition to the epidural collections, disc disease is again noted at L4-5 and L5-S1. Central foraminal narrowing are associated. IMPRESSION: 1. T7 laminectomy subcutaneous peripherally enhancing fluid collection at this level as well. 2. Residual posterior epidural fluid collection in the thoracic spine with similar cephalo caudad extension from T5-6-T9 but less mass effect. 3. Progressive signal abnormality and enhancement throughout the T7 and T8 vertebral bodies in the T7-8 thoracic disc consistent with disc osteomyelitis. 4. Progressive paraspinous soft tissue enhancement at T3-4  through T9-10 without drainable collection. 5. Persistent extensive ventral epidural collection compatible with abscess. 6. Status post left laminectomy at L3 with peripherally enhancing fluid collection extending from the operative site into the subcutaneous and deep tissues as described. This is concerning abscess. 7. Fluid tracks from the laminectomy site into a subdural deep soft tissue collection measuring 4.7 x 5.0 x 1.7 cm. 8. Separate ventral epidural collection at L4-5 and L5-S1 consistent with epidural abscess. 9. Progressive signal abnormality and enhancement in the L3 and L4 vertebral bodies. 10. 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 Electronically Signed   By: San Morelle M.D.   On: 01/05/2021 21:24    Procedures .Critical Care  Date/Time: 01/05/2021 11:52 PM Performed by: Varney Biles, MD Authorized by: Varney Biles, MD   Critical care provider statement:    Critical care time (minutes):  62   Critical care was necessary to treat or prevent imminent or life-threatening deterioration of the following conditions:  CNS failure or compromise   Critical care was time spent personally by me on the following activities:  Discussions with consultants, evaluation of patient's response to treatment, examination of patient, ordering and performing treatments and interventions, ordering and review of laboratory studies, ordering and review of radiographic studies, pulse oximetry, re-evaluation of patient's condition, obtaining history from patient or surrogate and review of old charts   Medications Ordered in ED Medications  HYDROmorphone (DILAUDID) injection 1 mg (has no administration in time range)  LORazepam (ATIVAN) tablet 0.5 mg (0.5 mg Oral Given 01/05/21 1733)  HYDROmorphone (DILAUDID) injection 1 mg (1 mg Intravenous Given  01/05/21 1739)  HYDROmorphone (DILAUDID) injection 1 mg (1 mg Intravenous Given 01/05/21 1916)  gadobutrol (GADAVIST) 1 MMOL/ML injection 9 mL (9 mLs Intravenous Contrast Given 01/05/21 2024)  HYDROmorphone (DILAUDID) injection 1 mg (1 mg Intravenous Given 01/05/21 2134)    ED Course  I have reviewed the triage vital signs and the nursing notes.  Pertinent labs & imaging results that were available during my care of the patient were reviewed by me and  considered in my medical decision making (see chart for details).    MDM Rules/Calculators/A&P                          55 year old male with history of bacteremia, osteomyelitis, discitis, epidural abscess and psoas abscess status post surgical management 2 weeks ago comes in with worsening back pain.  Patient uncomfortable secondary to pain.  No change to his regimen that could explain the worsening pain.  Patient has had 4-5 episodes of urinary incontinence, otherwise no red flag suggesting cauda equina or cord compression.   He is having some chills and sweats which are new.   In the setting of worsening back pain, pain shooting down the legs, sweats -we would want to rule out new abscess or progression of the disease.  MRI of the thoracic and lumbar spine has been ordered.  Pain control for now.  Reassessment: After the MRI resulted, I discussed the case with Maudie Mercury, from neurosurgery.  She had reviewed the MRI independently and discussed the case with Dr. Saintclair Halsted and advised that they will come and assess the patient and give [place] the disposition  The MRI results have been independently reviewed by me.  It appears that there is some progression of the disease over the vertebra and disc.  There is also fluid collection in questionable ventral abscess.  Dispo per neurosurgery, but anticipate admission.  11:52 PM Neurosurgery recommending that patient be admitted to the ER for decompression of the ventral abscess.  Final Clinical Impression(s) /  ED Diagnoses Final diagnoses:  Epidural abscess    Rx / DC Orders ED Discharge Orders     None        Varney Biles, MD 01/05/21 2352

## 2021-01-06 ENCOUNTER — Inpatient Hospital Stay (HOSPITAL_COMMUNITY): Payer: Federal, State, Local not specified - PPO

## 2021-01-06 ENCOUNTER — Encounter (HOSPITAL_COMMUNITY): Payer: Self-pay | Admitting: Family Medicine

## 2021-01-06 DIAGNOSIS — L02212 Cutaneous abscess of back [any part, except buttock]: Secondary | ICD-10-CM | POA: Diagnosis not present

## 2021-01-06 DIAGNOSIS — G062 Extradural and subdural abscess, unspecified: Secondary | ICD-10-CM

## 2021-01-06 DIAGNOSIS — Z872 Personal history of diseases of the skin and subcutaneous tissue: Secondary | ICD-10-CM | POA: Diagnosis not present

## 2021-01-06 DIAGNOSIS — A419 Sepsis, unspecified organism: Secondary | ICD-10-CM | POA: Diagnosis not present

## 2021-01-06 DIAGNOSIS — M545 Low back pain, unspecified: Secondary | ICD-10-CM | POA: Diagnosis not present

## 2021-01-06 DIAGNOSIS — E871 Hypo-osmolality and hyponatremia: Secondary | ICD-10-CM | POA: Diagnosis not present

## 2021-01-06 DIAGNOSIS — K7200 Acute and subacute hepatic failure without coma: Secondary | ICD-10-CM | POA: Diagnosis not present

## 2021-01-06 DIAGNOSIS — D649 Anemia, unspecified: Secondary | ICD-10-CM | POA: Diagnosis present

## 2021-01-06 DIAGNOSIS — G9340 Encephalopathy, unspecified: Secondary | ICD-10-CM

## 2021-01-06 DIAGNOSIS — J90 Pleural effusion, not elsewhere classified: Secondary | ICD-10-CM | POA: Diagnosis not present

## 2021-01-06 DIAGNOSIS — M79604 Pain in right leg: Secondary | ICD-10-CM | POA: Diagnosis not present

## 2021-01-06 DIAGNOSIS — M4624 Osteomyelitis of vertebra, thoracic region: Secondary | ICD-10-CM | POA: Diagnosis not present

## 2021-01-06 DIAGNOSIS — F419 Anxiety disorder, unspecified: Secondary | ICD-10-CM | POA: Diagnosis not present

## 2021-01-06 DIAGNOSIS — M7989 Other specified soft tissue disorders: Secondary | ICD-10-CM | POA: Diagnosis not present

## 2021-01-06 DIAGNOSIS — E785 Hyperlipidemia, unspecified: Secondary | ICD-10-CM | POA: Diagnosis not present

## 2021-01-06 DIAGNOSIS — G319 Degenerative disease of nervous system, unspecified: Secondary | ICD-10-CM | POA: Diagnosis not present

## 2021-01-06 DIAGNOSIS — E1169 Type 2 diabetes mellitus with other specified complication: Secondary | ICD-10-CM | POA: Diagnosis not present

## 2021-01-06 DIAGNOSIS — D75839 Thrombocytosis, unspecified: Secondary | ICD-10-CM | POA: Diagnosis present

## 2021-01-06 DIAGNOSIS — R7881 Bacteremia: Secondary | ICD-10-CM

## 2021-01-06 DIAGNOSIS — R4182 Altered mental status, unspecified: Secondary | ICD-10-CM | POA: Diagnosis not present

## 2021-01-06 DIAGNOSIS — E1165 Type 2 diabetes mellitus with hyperglycemia: Secondary | ICD-10-CM | POA: Diagnosis not present

## 2021-01-06 DIAGNOSIS — Z4789 Encounter for other orthopedic aftercare: Secondary | ICD-10-CM | POA: Diagnosis not present

## 2021-01-06 DIAGNOSIS — D509 Iron deficiency anemia, unspecified: Secondary | ICD-10-CM | POA: Diagnosis not present

## 2021-01-06 DIAGNOSIS — D6489 Other specified anemias: Secondary | ICD-10-CM | POA: Diagnosis not present

## 2021-01-06 DIAGNOSIS — M4645 Discitis, unspecified, thoracolumbar region: Secondary | ICD-10-CM | POA: Diagnosis not present

## 2021-01-06 DIAGNOSIS — R188 Other ascites: Secondary | ICD-10-CM | POA: Diagnosis not present

## 2021-01-06 DIAGNOSIS — E872 Acidosis: Secondary | ICD-10-CM | POA: Diagnosis present

## 2021-01-06 DIAGNOSIS — R5381 Other malaise: Secondary | ICD-10-CM | POA: Diagnosis not present

## 2021-01-06 DIAGNOSIS — I110 Hypertensive heart disease with heart failure: Secondary | ICD-10-CM | POA: Diagnosis not present

## 2021-01-06 DIAGNOSIS — R1312 Dysphagia, oropharyngeal phase: Secondary | ICD-10-CM | POA: Diagnosis not present

## 2021-01-06 DIAGNOSIS — M4644 Discitis, unspecified, thoracic region: Secondary | ICD-10-CM | POA: Diagnosis not present

## 2021-01-06 DIAGNOSIS — B370 Candidal stomatitis: Secondary | ICD-10-CM | POA: Diagnosis not present

## 2021-01-06 DIAGNOSIS — I5032 Chronic diastolic (congestive) heart failure: Secondary | ICD-10-CM | POA: Diagnosis not present

## 2021-01-06 DIAGNOSIS — A4102 Sepsis due to Methicillin resistant Staphylococcus aureus: Secondary | ICD-10-CM | POA: Diagnosis not present

## 2021-01-06 DIAGNOSIS — I2510 Atherosclerotic heart disease of native coronary artery without angina pectoris: Secondary | ICD-10-CM

## 2021-01-06 DIAGNOSIS — E876 Hypokalemia: Secondary | ICD-10-CM | POA: Diagnosis not present

## 2021-01-06 DIAGNOSIS — J019 Acute sinusitis, unspecified: Secondary | ICD-10-CM | POA: Diagnosis not present

## 2021-01-06 DIAGNOSIS — K831 Obstruction of bile duct: Secondary | ICD-10-CM | POA: Diagnosis present

## 2021-01-06 DIAGNOSIS — I083 Combined rheumatic disorders of mitral, aortic and tricuspid valves: Secondary | ICD-10-CM | POA: Diagnosis not present

## 2021-01-06 DIAGNOSIS — R6520 Severe sepsis without septic shock: Secondary | ICD-10-CM | POA: Diagnosis not present

## 2021-01-06 DIAGNOSIS — E119 Type 2 diabetes mellitus without complications: Secondary | ICD-10-CM | POA: Diagnosis not present

## 2021-01-06 DIAGNOSIS — E861 Hypovolemia: Secondary | ICD-10-CM | POA: Diagnosis present

## 2021-01-06 DIAGNOSIS — G8929 Other chronic pain: Secondary | ICD-10-CM | POA: Diagnosis present

## 2021-01-06 DIAGNOSIS — Z4682 Encounter for fitting and adjustment of non-vascular catheter: Secondary | ICD-10-CM | POA: Diagnosis not present

## 2021-01-06 DIAGNOSIS — Z951 Presence of aortocoronary bypass graft: Secondary | ICD-10-CM | POA: Diagnosis not present

## 2021-01-06 DIAGNOSIS — Z79899 Other long term (current) drug therapy: Secondary | ICD-10-CM | POA: Diagnosis not present

## 2021-01-06 DIAGNOSIS — E669 Obesity, unspecified: Secondary | ICD-10-CM | POA: Diagnosis not present

## 2021-01-06 DIAGNOSIS — M4804 Spinal stenosis, thoracic region: Secondary | ICD-10-CM | POA: Diagnosis not present

## 2021-01-06 DIAGNOSIS — B9562 Methicillin resistant Staphylococcus aureus infection as the cause of diseases classified elsewhere: Secondary | ICD-10-CM | POA: Diagnosis not present

## 2021-01-06 DIAGNOSIS — M4626 Osteomyelitis of vertebra, lumbar region: Secondary | ICD-10-CM | POA: Diagnosis not present

## 2021-01-06 DIAGNOSIS — K6812 Psoas muscle abscess: Secondary | ICD-10-CM | POA: Diagnosis not present

## 2021-01-06 DIAGNOSIS — F17210 Nicotine dependence, cigarettes, uncomplicated: Secondary | ICD-10-CM | POA: Diagnosis not present

## 2021-01-06 DIAGNOSIS — I10 Essential (primary) hypertension: Secondary | ICD-10-CM

## 2021-01-06 DIAGNOSIS — M79605 Pain in left leg: Secondary | ICD-10-CM | POA: Diagnosis not present

## 2021-01-06 DIAGNOSIS — Z95828 Presence of other vascular implants and grafts: Secondary | ICD-10-CM | POA: Diagnosis not present

## 2021-01-06 DIAGNOSIS — G9341 Metabolic encephalopathy: Secondary | ICD-10-CM | POA: Diagnosis not present

## 2021-01-06 DIAGNOSIS — R109 Unspecified abdominal pain: Secondary | ICD-10-CM | POA: Diagnosis not present

## 2021-01-06 DIAGNOSIS — Z20822 Contact with and (suspected) exposure to covid-19: Secondary | ICD-10-CM | POA: Diagnosis present

## 2021-01-06 DIAGNOSIS — Z7982 Long term (current) use of aspirin: Secondary | ICD-10-CM | POA: Diagnosis not present

## 2021-01-06 DIAGNOSIS — M4620 Osteomyelitis of vertebra, site unspecified: Secondary | ICD-10-CM | POA: Diagnosis not present

## 2021-01-06 DIAGNOSIS — E222 Syndrome of inappropriate secretion of antidiuretic hormone: Secondary | ICD-10-CM | POA: Diagnosis not present

## 2021-01-06 DIAGNOSIS — G061 Intraspinal abscess and granuloma: Secondary | ICD-10-CM | POA: Diagnosis not present

## 2021-01-06 DIAGNOSIS — D75838 Other thrombocytosis: Secondary | ICD-10-CM | POA: Diagnosis not present

## 2021-01-06 DIAGNOSIS — R9431 Abnormal electrocardiogram [ECG] [EKG]: Secondary | ICD-10-CM | POA: Diagnosis not present

## 2021-01-06 DIAGNOSIS — M48061 Spinal stenosis, lumbar region without neurogenic claudication: Secondary | ICD-10-CM | POA: Diagnosis not present

## 2021-01-06 DIAGNOSIS — R0989 Other specified symptoms and signs involving the circulatory and respiratory systems: Secondary | ICD-10-CM | POA: Diagnosis not present

## 2021-01-06 DIAGNOSIS — Z4659 Encounter for fitting and adjustment of other gastrointestinal appliance and device: Secondary | ICD-10-CM | POA: Diagnosis not present

## 2021-01-06 DIAGNOSIS — L89152 Pressure ulcer of sacral region, stage 2: Secondary | ICD-10-CM | POA: Diagnosis not present

## 2021-01-06 DIAGNOSIS — E43 Unspecified severe protein-calorie malnutrition: Secondary | ICD-10-CM | POA: Diagnosis not present

## 2021-01-06 DIAGNOSIS — M4649 Discitis, unspecified, multiple sites in spine: Secondary | ICD-10-CM | POA: Diagnosis present

## 2021-01-06 DIAGNOSIS — M4627 Osteomyelitis of vertebra, lumbosacral region: Secondary | ICD-10-CM | POA: Diagnosis not present

## 2021-01-06 LAB — BASIC METABOLIC PANEL
Anion gap: 18 — ABNORMAL HIGH (ref 5–15)
BUN: 7 mg/dL (ref 6–20)
CO2: 19 mmol/L — ABNORMAL LOW (ref 22–32)
Calcium: 8.4 mg/dL — ABNORMAL LOW (ref 8.9–10.3)
Chloride: 93 mmol/L — ABNORMAL LOW (ref 98–111)
Creatinine, Ser: 0.74 mg/dL (ref 0.61–1.24)
GFR, Estimated: >60 mL/min (ref >60)
Glucose, Bld: 100 mg/dL — ABNORMAL HIGH (ref 70–99)
Potassium: 4.7 mmol/L (ref 3.5–5.1)
Sodium: 130 mmol/L — ABNORMAL LOW (ref 135–145)

## 2021-01-06 LAB — CBC
HCT: 38 % — ABNORMAL LOW (ref 39.0–52.0)
Hemoglobin: 12.4 g/dL — ABNORMAL LOW (ref 13.0–17.0)
MCH: 29.4 pg (ref 26.0–34.0)
MCHC: 32.6 g/dL (ref 30.0–36.0)
MCV: 90 fL (ref 80.0–100.0)
Platelets: 457 10*3/uL — ABNORMAL HIGH (ref 150–400)
RBC: 4.22 MIL/uL (ref 4.22–5.81)
RDW: 15.5 % (ref 11.5–15.5)
WBC: 31.7 10*3/uL — ABNORMAL HIGH (ref 4.0–10.5)
nRBC: 0 % (ref 0.0–0.2)

## 2021-01-06 LAB — SEDIMENTATION RATE: Sed Rate: 58 mm/hr — ABNORMAL HIGH (ref 0–16)

## 2021-01-06 LAB — HEPATIC FUNCTION PANEL
ALT: 16 U/L (ref 0–44)
AST: 22 U/L (ref 15–41)
Albumin: 1.6 g/dL — ABNORMAL LOW (ref 3.5–5.0)
Alkaline Phosphatase: 137 U/L — ABNORMAL HIGH (ref 38–126)
Bilirubin, Direct: 0.3 mg/dL — ABNORMAL HIGH (ref 0.0–0.2)
Indirect Bilirubin: 1.1 mg/dL — ABNORMAL HIGH (ref 0.3–0.9)
Total Bilirubin: 1.4 mg/dL — ABNORMAL HIGH (ref 0.3–1.2)
Total Protein: 6.3 g/dL — ABNORMAL LOW (ref 6.5–8.1)

## 2021-01-06 LAB — GLUCOSE, CAPILLARY
Glucose-Capillary: 141 mg/dL — ABNORMAL HIGH (ref 70–99)
Glucose-Capillary: 124 mg/dL — ABNORMAL HIGH (ref 70–99)
Glucose-Capillary: 130 mg/dL — ABNORMAL HIGH (ref 70–99)

## 2021-01-06 LAB — RETICULOCYTES
Immature Retic Fract: 20.2 % — ABNORMAL HIGH (ref 2.3–15.9)
RBC.: 4.23 MIL/uL (ref 4.22–5.81)
Retic Count, Absolute: 55.8 10*3/uL (ref 19.0–186.0)
Retic Ct Pct: 1.3 % (ref 0.4–3.1)

## 2021-01-06 LAB — CBG MONITORING, ED
Glucose-Capillary: 131 mg/dL — ABNORMAL HIGH (ref 70–99)
Glucose-Capillary: 134 mg/dL — ABNORMAL HIGH (ref 70–99)
Glucose-Capillary: 140 mg/dL — ABNORMAL HIGH (ref 70–99)

## 2021-01-06 LAB — OSMOLALITY, URINE: Osmolality, Ur: 484 mOsm/kg (ref 300–900)

## 2021-01-06 LAB — CK: Total CK: 32 U/L — ABNORMAL LOW (ref 49–397)

## 2021-01-06 LAB — FOLATE: Folate: 2.5 ng/mL — ABNORMAL LOW (ref >5.9)

## 2021-01-06 LAB — C-REACTIVE PROTEIN: CRP: 31.1 mg/dL — ABNORMAL HIGH (ref <1.0)

## 2021-01-06 LAB — IRON AND TIBC
Iron: 19 ug/dL — ABNORMAL LOW (ref 45–182)
Saturation Ratios: 12 % — ABNORMAL LOW (ref 17.9–39.5)
TIBC: 158 ug/dL — ABNORMAL LOW (ref 250–450)
UIBC: 139 ug/dL

## 2021-01-06 LAB — VITAMIN B12: Vitamin B-12: 1070 pg/mL — ABNORMAL HIGH (ref 180–914)

## 2021-01-06 LAB — SODIUM, URINE, RANDOM: Sodium, Ur: 14 mmol/L

## 2021-01-06 LAB — FERRITIN: Ferritin: 864 ng/mL — ABNORMAL HIGH (ref 24–336)

## 2021-01-06 MED ORDER — VANCOMYCIN HCL 1500 MG/300ML IV SOLN
1500.0000 mg | Freq: Two times a day (BID) | INTRAVENOUS | Status: DC
  Administered 2021-01-07 – 2021-01-09 (×6): 1500 mg via INTRAVENOUS
  Filled 2021-01-06 (×7): qty 300

## 2021-01-06 MED ORDER — GABAPENTIN 100 MG PO CAPS
200.0000 mg | ORAL_CAPSULE | Freq: Three times a day (TID) | ORAL | Status: DC
  Administered 2021-01-06 – 2021-01-19 (×41): 200 mg via ORAL
  Filled 2021-01-06 (×44): qty 2

## 2021-01-06 MED ORDER — DICLOFENAC SODIUM 1 % EX GEL
1.0000 "application " | Freq: Four times a day (QID) | CUTANEOUS | Status: DC | PRN
  Administered 2021-01-10 – 2021-01-24 (×15): 1 via TOPICAL
  Filled 2021-01-06 (×2): qty 100

## 2021-01-06 MED ORDER — ONDANSETRON HCL 4 MG/2ML IJ SOLN
4.0000 mg | Freq: Four times a day (QID) | INTRAMUSCULAR | Status: DC | PRN
  Administered 2021-01-09 – 2021-01-26 (×12): 4 mg via INTRAVENOUS
  Filled 2021-01-06 (×12): qty 2

## 2021-01-06 MED ORDER — SODIUM CHLORIDE 0.9 % IV SOLN: INTRAVENOUS | Status: AC

## 2021-01-06 MED ORDER — ALPRAZOLAM 0.25 MG PO TABS
0.2500 mg | ORAL_TABLET | Freq: Two times a day (BID) | ORAL | Status: DC | PRN
  Administered 2021-01-06 – 2021-01-18 (×9): 0.25 mg via ORAL
  Filled 2021-01-06 (×10): qty 1

## 2021-01-06 MED ORDER — VANCOMYCIN HCL 2000 MG/400ML IV SOLN
2000.0000 mg | Freq: Once | INTRAVENOUS | Status: AC
  Administered 2021-01-06: 2000 mg via INTRAVENOUS
  Filled 2021-01-06: qty 400

## 2021-01-06 MED ORDER — VENLAFAXINE HCL ER 75 MG PO CP24
75.0000 mg | ORAL_CAPSULE | Freq: Every morning | ORAL | Status: DC
  Administered 2021-01-06 – 2021-01-28 (×22): 75 mg via ORAL
  Filled 2021-01-06 (×22): qty 1

## 2021-01-06 MED ORDER — ACETAMINOPHEN 650 MG RE SUPP: 650.0000 mg | Freq: Four times a day (QID) | RECTAL | Status: DC | PRN

## 2021-01-06 MED ORDER — PANTOPRAZOLE SODIUM 40 MG PO TBEC
40.0000 mg | DELAYED_RELEASE_TABLET | Freq: Every day | ORAL | Status: DC
  Administered 2021-01-06 – 2021-01-13 (×8): 40 mg via ORAL
  Filled 2021-01-06 (×9): qty 1

## 2021-01-06 MED ORDER — LIDOCAINE 5 % EX PTCH
1.0000 | MEDICATED_PATCH | Freq: Every day | CUTANEOUS | Status: DC | PRN
  Administered 2021-01-06 – 2021-01-12 (×5): 1 via TRANSDERMAL
  Filled 2021-01-06 (×5): qty 1

## 2021-01-06 MED ORDER — LACTULOSE 10 GM/15ML PO SOLN
10.0000 g | Freq: Every day | ORAL | Status: DC
  Administered 2021-01-06 – 2021-01-18 (×6): 10 g via ORAL
  Filled 2021-01-06 (×10): qty 15

## 2021-01-06 MED ORDER — ATORVASTATIN CALCIUM 40 MG PO TABS
40.0000 mg | ORAL_TABLET | Freq: Every day | ORAL | Status: DC
  Administered 2021-01-06 – 2021-01-27 (×22): 40 mg via ORAL
  Filled 2021-01-06 (×24): qty 1

## 2021-01-06 MED ORDER — ONDANSETRON HCL 4 MG PO TABS: 4.0000 mg | ORAL_TABLET | Freq: Four times a day (QID) | ORAL | Status: DC | PRN

## 2021-01-06 MED ORDER — POLYETHYLENE GLYCOL 3350 17 G PO PACK
17.0000 g | PACK | Freq: Two times a day (BID) | ORAL | Status: DC
  Administered 2021-01-06 – 2021-01-18 (×13): 17 g via ORAL
  Filled 2021-01-06 (×20): qty 1

## 2021-01-06 MED ORDER — OXYCODONE HCL ER 20 MG PO T12A
20.0000 mg | EXTENDED_RELEASE_TABLET | Freq: Two times a day (BID) | ORAL | Status: DC
  Administered 2021-01-06 – 2021-01-19 (×27): 20 mg via ORAL
  Filled 2021-01-06 (×5): qty 1
  Filled 2021-01-06: qty 2
  Filled 2021-01-06: qty 1
  Filled 2021-01-06: qty 2
  Filled 2021-01-06 (×3): qty 1
  Filled 2021-01-06 (×3): qty 2
  Filled 2021-01-06 (×5): qty 1
  Filled 2021-01-06: qty 2
  Filled 2021-01-06: qty 1
  Filled 2021-01-06: qty 2
  Filled 2021-01-06 (×3): qty 1
  Filled 2021-01-06 (×2): qty 2

## 2021-01-06 MED ORDER — SODIUM CHLORIDE 0.9 % IV SOLN
750.0000 mg | Freq: Every day | INTRAVENOUS | Status: DC
  Filled 2021-01-06: qty 15

## 2021-01-06 MED ORDER — INSULIN ASPART 100 UNIT/ML IJ SOLN
0.0000 [IU] | INTRAMUSCULAR | Status: DC
  Administered 2021-01-06 – 2021-01-07 (×8): 1 [IU] via SUBCUTANEOUS
  Administered 2021-01-07 – 2021-01-08 (×3): 2 [IU] via SUBCUTANEOUS
  Administered 2021-01-08 (×5): 3 [IU] via SUBCUTANEOUS
  Administered 2021-01-09: 1 [IU] via SUBCUTANEOUS
  Administered 2021-01-09 (×4): 2 [IU] via SUBCUTANEOUS
  Administered 2021-01-10: 1 [IU] via SUBCUTANEOUS
  Administered 2021-01-10 (×2): 2 [IU] via SUBCUTANEOUS
  Administered 2021-01-10 (×3): 1 [IU] via SUBCUTANEOUS
  Administered 2021-01-10 – 2021-01-11 (×2): 2 [IU] via SUBCUTANEOUS
  Administered 2021-01-11: 1 [IU] via SUBCUTANEOUS
  Administered 2021-01-11 – 2021-01-12 (×7): 2 [IU] via SUBCUTANEOUS
  Administered 2021-01-12: 1 [IU] via SUBCUTANEOUS
  Administered 2021-01-12 – 2021-01-13 (×2): 2 [IU] via SUBCUTANEOUS
  Administered 2021-01-13: 3 [IU] via SUBCUTANEOUS
  Administered 2021-01-13: 2 [IU] via SUBCUTANEOUS
  Administered 2021-01-13: 1 [IU] via SUBCUTANEOUS
  Administered 2021-01-13 – 2021-01-14 (×3): 2 [IU] via SUBCUTANEOUS
  Administered 2021-01-14: 5 [IU] via SUBCUTANEOUS
  Administered 2021-01-14 – 2021-01-15 (×3): 2 [IU] via SUBCUTANEOUS
  Administered 2021-01-15: 3 [IU] via SUBCUTANEOUS
  Administered 2021-01-15 (×2): 2 [IU] via SUBCUTANEOUS
  Administered 2021-01-15 – 2021-01-16 (×2): 3 [IU] via SUBCUTANEOUS
  Administered 2021-01-16: 2 [IU] via SUBCUTANEOUS
  Administered 2021-01-16: 3 [IU] via SUBCUTANEOUS
  Administered 2021-01-16: 2 [IU] via SUBCUTANEOUS
  Administered 2021-01-16: 5 [IU] via SUBCUTANEOUS
  Administered 2021-01-16 – 2021-01-17 (×3): 3 [IU] via SUBCUTANEOUS
  Administered 2021-01-17 (×2): 2 [IU] via SUBCUTANEOUS
  Administered 2021-01-17 – 2021-01-18 (×3): 5 [IU] via SUBCUTANEOUS
  Administered 2021-01-18: 3 [IU] via SUBCUTANEOUS
  Administered 2021-01-18: 2 [IU] via SUBCUTANEOUS
  Administered 2021-01-18: 3 [IU] via SUBCUTANEOUS
  Administered 2021-01-18 – 2021-01-19 (×6): 5 [IU] via SUBCUTANEOUS
  Administered 2021-01-19: 7 [IU] via SUBCUTANEOUS
  Administered 2021-01-20: 5 [IU] via SUBCUTANEOUS
  Administered 2021-01-20 (×2): 2 [IU] via SUBCUTANEOUS
  Administered 2021-01-20: 7 [IU] via SUBCUTANEOUS
  Administered 2021-01-20: 3 [IU] via SUBCUTANEOUS
  Administered 2021-01-20: 5 [IU] via SUBCUTANEOUS
  Administered 2021-01-21: 3 [IU] via SUBCUTANEOUS

## 2021-01-06 MED ORDER — LORAZEPAM 2 MG/ML IJ SOLN
0.5000 mg | INTRAMUSCULAR | Status: AC | PRN
  Administered 2021-01-06: 0.5 mg via INTRAVENOUS
  Filled 2021-01-06: qty 1

## 2021-01-06 MED ORDER — ACETAMINOPHEN 325 MG PO TABS
650.0000 mg | ORAL_TABLET | Freq: Four times a day (QID) | ORAL | Status: DC | PRN
  Administered 2021-01-07 – 2021-01-09 (×3): 650 mg via ORAL
  Filled 2021-01-06 (×3): qty 2

## 2021-01-06 MED ORDER — METOPROLOL TARTRATE 25 MG PO TABS
25.0000 mg | ORAL_TABLET | Freq: Every day | ORAL | Status: DC
  Administered 2021-01-06 – 2021-01-08 (×3): 25 mg via ORAL
  Filled 2021-01-06 (×3): qty 1

## 2021-01-06 MED ORDER — HYDROMORPHONE HCL 1 MG/ML IJ SOLN
1.0000 mg | INTRAMUSCULAR | Status: DC | PRN
  Administered 2021-01-06 – 2021-01-18 (×44): 1 mg via INTRAVENOUS
  Filled 2021-01-06 (×45): qty 1

## 2021-01-06 MED ORDER — GADOBUTROL 1 MMOL/ML IV SOLN
9.5000 mL | Freq: Once | INTRAVENOUS | Status: AC | PRN
  Administered 2021-01-06: 9.5 mL via INTRAVENOUS

## 2021-01-06 NOTE — ED Notes (Signed)
Pt requested bed pan for BM.  Passed gas and requested bed pan be removed secondary to back pain.

## 2021-01-06 NOTE — ED Notes (Signed)
Louis [name] wife [phone] would like an update on the patient

## 2021-01-06 NOTE — ED Notes (Signed)
Pt writhing in bed.  Has self d/c cardiac monitor, bp cuff and pulse oximeter.

## 2021-01-06 NOTE — ED Notes (Signed)
Pt writhing states he needs to void.  External male catheter applied

## 2021-01-06 NOTE — Progress Notes (Signed)
Care started prior to midnight in the emergency room and patient was admitted early this morning after midnight by Dr. Christia Reading Opyd and I am in current agreement with this assessment and plan.  Additional changes to the plan of care been made accordingly.  Patient is a 55 year old Caucasian male with a past medical history significant for but not limited to type 2 diabetes mellitus, hypertension, history of CAD with CABG in 2017, anxiety, chronic pain as well as other comorbidities with recent admission with an epidural abscess, vertebral osteomyelitis, psoas abscess and MRSA bacteremia now returning to the ED with worsening back pain.  He reported that his back pain has been stable until approximately 5 days ago when it became more severe and progressively worsened since then.  He did not notice any new leg numbness or weakness did have some urinary incontinence.  Back pain was reportedly severe and constant worse with movement and radiating to both his legs.  He denied any fevers or chills but stated he did have some mild drainage from his surgical wounds.  He missed his dose of antibiotics yesterday but has not missed any other doses.  He been admitted on 12/12/2020 after he is found to have an epidural abscess and vertebral osteomyelitis and after over there is no neurosurgery or ID backup.  He underwent a laminectomy and evacuation of abscess on 12/13/2020 and then drainage of the psoas abscess by IR on 12/22/2020.  Blood cultures from 12/14/2018 grew out MRSA but there is no vegetation seen on TTE and blood cultures were drawn and repeated 12/15/2020 remain negative.  He has been on daptomycin at home.  Upon arrival to ED is found to be saturating well on room air but he did have a leukocytosis 23,900 and platelet count of 681,000.  COVID PCR was negative.  He had an MRI of the thoracic and lumbar spine which revealed multiple fluid collections involving the ventral epidural abscess at the L4-L5 level with  progressive discitis and osteomyelitis at the L4-L5 and L5-S1 levels.  Blood cultures were repeated in the ED and he was given multiple doses of IV analgesics and he is evaluated by neurosurgery who recommended medical admission.  Initial plan was for lumbar decompression however Dr. Reatha Armour of neurosurgery evaluated and felt that his thoracic mass-effect was improved and that even though there was persistent ostial discitis and epidural abscess throughout the thoracolumbar spine there is possibly a more loculated focus of the abscess at L4-L5 with stenosis but no focal weakness, groin numbness or numbness and tingling in his legs he did not believe that urgent or emergent decompression was needed at this time.  Patient has been confused more often per his wife and neurosurgery is obtaining MRI of the brain with and without contrast given concern of some cerebral spread of infection.  Because he has failed multiple medical treatments will likely need broaden antibiotic therapy and will consult ID and neurosurgery feels that the stenosis L4-L5 could be decompressed however neurologically patient is doing well with no focal deficits and therefore they are recommending aggressive medical and supportive care.  Currently is being admitted and treated for the following but not limited to:  Sepsis secondary to epidural abscess; discitis/osteomyelitis; MRSA bacteremia     -Patient met sepsis physiology on admission as he was tachycardic, tachypneic, had a leukocytosis and no source of infection -Pt with laminectomy & evacuation of abscess 12/21/20, IR drainage of psoas abscess 6/29, and MRSA bacteremia currently on daptomycin presents with  worsening back pain and found on MRI L- and T-spine to have multiple fluid collections including ventral epidural abscess and progressive osteomyelitis at L 4-5 -Blood cultures grew MRSA on 6/20, were negative 6/22, no vegetation seen on TTE 12/15/20  - Neurosurgery was planning for  L4-5 decompression 01/06/21 but re-evaluated and since patient is Neurologically intact will hold off and treat medically do not feel that urgent or emergent decompression is needed at this time -WBC is gone from 23.9 is now 31.7 -Continue IV antibiotics, and pain-control  -Check a UDS -We will formally consult ID for further evaluation recommendations  Confusion -Neurosurgery is obtaining a MRI of the brain with and without contrast given that he does have an extensive epidural abscess the spine with encephalopathy   Normocytic Anemia  - Hgb is 8.6, down from 11.0 on 12/29/20; repeat hemoglobin/hematocrit is now 12.4/38.0 - No overt bleeding  -Checked anemia panel and showed iron level of 19, U IBC 139, TIBC 158, saturation ratios of 12%, ferritin level 864, folate of 2.5, vitamin B12 level 1070 -Continue to monitor for signs and symptoms of bleeding -Repeat CBC in a.m.   Type II DM  - A1c was 9.1% in June 2022  - Check CBGs and use SSI for now ; CBGs ranging from 131-141   CAD  - No anginal complaints  - Held ASA preoperatively however if he is not going to surgery we will resume, continue atorvastatin 40 mg nightly and beta-blocker with metoprolol tartrate 25 mg nightly   Hyponatremia  - Serum sodium 129 on admission and is improved to 130 - Appears chronic, possibly related to his SNRI   - Check urine sodium and osm   -Continue monitor and trend sodium level  Thrombocytosis -Likely reactive in the setting of infection -The patient's platelet count was 681 is now trended down to 457 -Continue monitor and trend and repeat  Elevated anion metabolic acidosis -Mild -The patient is CO2 is now 19, anion gap is 18, CO2 is 23  -Continue with IV fluid Hydration with normal saline at 100 MLS per hour  GERD -Continue with pantoprazole 40 mg p.o. daily  Hyperbilirubinemia -Likely reactive -Patient's T bili was 1.4 and his indirect bili was 1.1 and his direct bili was 0.3 -Continue  to Monitor and trend and repeat CMP in the a.m.   Chronic Diastolic CHF  - Appears hypovolemic on admission  - Hold Lasix, started gentle IVF hydration, monitor volume status     Anxiety  - Continue venlafaxine XR 25 mg daily and as-needed Xanax    Will continue to monitor the patient's clinical response to intervention and repeat blood work in the a.m. and follow-up on his imaging with MRI.  We will need to follow-up on specialist recommendations and have consulted ID and appreciate neurosurgery recommendations.

## 2021-01-06 NOTE — ED Notes (Signed)
Attempted to call report x 1  

## 2021-01-06 NOTE — Consult Note (Signed)
[facility]    Date of Admission:  01/05/2021     Reason for Consult: MRSA bacteremia     Referring Physician: Dr Marland Mcalpine  Current antibiotics: Daptomycin  ASSESSMENT:    55 year old man admitted with increased back pain in the setting of recent history of MRSA bacteremia with epidural abscess/osteomyelitis and psoas abscess.  His repeat blood cultures obtained are floridly positive for GPC's which most likely represents relapsed MRSA infection.  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  His repeat spine MRI shows progression of his epidural disease/OM and he has been evaluated by neurosurgery without current plans to return to the OR at this time.  Additionally, he has had some increased confusion and MRI brain was obtained with results pending.  He reports increased pain in his left proximal lower extremity where he previously had a psoas abscess.  PLAN:    Discontinue daptomycin Switch to vancomycin per pharmacy TTE.  If negative, would proceed with TEE as his presentation is concerning for endocarditis CT with contrast of the pelvis to further evaluate for reaccumulation of psoas abscess Follow-up MRI brain Will check daptomycin susceptibilities on current cultures Repeat blood cultures Remove PICC line Appreciate neurosurgery evaluation.  Concerned that he will require repeat decompression for adequate source control Will follow   Principal Problem:   MRSA bacteremia Active Problems:   CAD (coronary artery disease)   HTN (hypertension)   Uncontrolled diabetes mellitus (HCC)   Epidural abscess   Normocytic anemia   Hyponatremia   Anxiety   Chronic diastolic CHF (congestive heart failure) (HCC)   MEDICATIONS:    Scheduled Meds: . atorvastatin  40 mg Oral QHS  . gabapentin  200 mg Oral TID  . insulin aspart  0-9 Units Subcutaneous Q4H  .  lactulose  10 g Oral Daily  . metoprolol tartrate  25 mg Oral QHS  . oxyCODONE  20 mg Oral Q12H  . pantoprazole  40 mg Oral Daily  . polyethylene glycol  17 g Oral BID  . venlafaxine XR  75 mg Oral q morning   Continuous Infusions: . sodium chloride 100 mL/hr at 01/06/21 0450  . [START ON 01/07/2021] vancomycin    . vancomycin     PRN Meds:.acetaminophen **OR** acetaminophen, ALPRAZolam, diclofenac Sodium, HYDROmorphone (DILAUDID) injection, lidocaine, ondansetron **OR** ondansetron (ZOFRAN) IV  HPI:    Louis [name] is a 55 y.o. male with past medical history significant for type 2 diabetes (hemoglobin A1c 9.1), hypertension, CAD status post CABG 2017, anxiety, and recent admission for epidural abscess/vertebral osteomyelitis, psoas abscess, and MRSA bacteremia who presented back to the ER overnight with worsening back pain.  Repeat MRI showed persistent osteodiscitis and epidural abscess throughout the thoracolumbar spine.  There was possibly a more loculated focus of abscess at L4-L5 with stenosis, however, he has no focal weakness, no saddle anesthesia, and no urinary retention.  Neurosurgery does not believe urgent or emergent decompression is needed at this time.  They recommend medical management.  Upon admission patient was found to be afebrile with a leukocytosis of 31.7.  He was tachycardic and tachypneic.  He was resumed on daptomycin and peripheral blood cultures were obtained which are positive in 4 out of 4 bottles for gram-positive cocci.  He reports this afternoon having increased back pain as well as pain in his left leg.  He states that his PICC line has been  working well without any issues.  His wife has reported confusion and he has gone for an MRI brain today as well.   Past Medical History:  Diagnosis Date  . CAD (coronary artery disease)   . DM (diabetes mellitus) (HCC)   . Hyperlipidemia   . Hypertension     Social History   Tobacco Use  . Smoking status: Every Day     Packs/day: 2.00    Types: Cigarettes    Start date: 06/28/1995  . Smokeless tobacco: Never  Vaping Use  . Vaping Use: Never used  Substance Use Topics  . Alcohol use: Not Currently  . Drug use: Never    History reviewed. No pertinent family history.  No Known Allergies  Review of Systems  Constitutional: Negative.   Respiratory: Negative.    Cardiovascular: Negative.   Gastrointestinal: Negative.   Musculoskeletal:  Positive for back pain.       + leg pain  Skin:  Negative for rash.  Neurological:  Negative for focal weakness and headaches.       + confusion  All other systems reviewed and are negative.  OBJECTIVE:   Blood pressure (!) 168/96, pulse (!) 117, temperature (!) 97.4 F (36.3 C), temperature source Oral, resp. rate 19, SpO2 99 %. There is no height or weight on file to calculate BMI.  Physical Exam Constitutional:      Comments: Lying naked in bed, spontaneously moving all extremities, appears encephalopathic  HENT:     Head: Normocephalic and atraumatic.  Eyes:     Extraocular Movements: Extraocular movements intact.     Conjunctiva/sclera: Conjunctivae normal.  Cardiovascular:     Rate and Rhythm: Regular rhythm. Tachycardia present.     Heart sounds: Murmur heard.     Comments: Prior CABG scar Pulmonary:     Effort: Pulmonary effort is normal. No respiratory distress.     Comments: Breath sounds diminished at the bases Abdominal:     General: Bowel sounds are normal.     Palpations: Abdomen is soft.     Tenderness: There is no abdominal tenderness.  Musculoskeletal:        General: No swelling or tenderness. Normal range of motion.     Cervical back: Normal range of motion and neck supple.  Neurological:     General: No focal deficit present.     Mental Status: He is oriented to person, place, and time.     Comments: He is oriented to person, place, time.  However, he appears to be encephalopathic as evidenced by asking questions of people who  were not in the room.     Lab Results: Lab Results  Component Value Date   WBC 31.7 (H) 01/06/2021   HGB 12.4 (L) 01/06/2021   HCT 38.0 (L) 01/06/2021   MCV 90.0 01/06/2021   PLT 457 (H) 01/06/2021    Lab Results  Component Value Date   NA 130 (L) 01/06/2021   K 4.7 01/06/2021   CO2 19 (L) 01/06/2021   GLUCOSE 100 (H) 01/06/2021   BUN 7 01/06/2021   CREATININE 0.74 01/06/2021   CALCIUM 8.4 (L) 01/06/2021   GFRNONAA >60 01/06/2021    Lab Results  Component Value Date   ALT 16 01/06/2021   AST 22 01/06/2021   ALKPHOS 137 (H) 01/06/2021   BILITOT 1.4 (H) 01/06/2021       Component Value Date/Time   CRP 31.1 (H) 01/06/2021 0310       Component Value Date/Time  ESRSEDRATE 58 (H) 01/06/2021 1316    I have reviewed the micro and lab results in Epic.  Imaging: MR THORACIC SPINE W WO CONTRAST  Result Date: 01/05/2021 CLINICAL DATA:  Spine surgery 2 weeks ago. Increasing pain for the last 2 days. Redness and swelling to the operative site. EXAM: MRI THORACIC AND LUMBAR SPINE WITHOUT AND WITH CONTRAST TECHNIQUE: Multiplanar and multiecho pulse sequences of the thoracic and lumbar spine were obtained without and with intravenous contrast. CONTRAST:  10mL GADAVIST GADOBUTROL 1 MMOL/ML IV SOLN COMPARISON:  None. FINDINGS: MRI THORACIC SPINE FINDINGS Alignment:  No significant listhesis is present. Vertebrae: Diffuse T2 hyperintensity enhancement throughout the T7 and T8 vertebral bodies has increased. Patient is status post T7 laminectomy. Focal enhancement is again noted at the T4 vertebral body. Cord: Cord is displaced posteriorly by a epidural fluid T6-7. Cord size and signal are otherwise normal. Paraspinal and other soft tissues: Posterior epidural fluid collection is partially decompressed. It extends superiorly to the T5-6 level similar the prior exam. There is less mass effect posteriorly than on the previous study. Collection extends inferiorly to the T9 level. There is an  extensive ventral collection extending into the lumbar spine. Abnormal paraspinous soft tissue enhancement extends from T3-4 to T9-10. No drainable collection is present. There is a peripherally enhancing fluid collection the operative bed which measures 6.1 x 1.0 x 2.1 cm. Disc levels: No focal disc disease is present. Abnormal enhancement is present in the foramina on the left T4-5, T5-6, T6-7 T7-8, and T8-9. Right foraminal enhancement is less extensive predominantly involving T6-7, T7-8 T8-9. MRI LUMBAR SPINE FINDINGS Segmentation: 5 non rib-bearing lumbar type vertebral bodies are present. The lowest fully formed vertebral body is L5. Alignment:  No significant listhesis is present. Vertebrae: Progressive signal abnormality and enhancement is present in the vertebral bodies, predominantly at L3 and L4. There is abnormal disc signal without definite disc enhancement at L3-4 and L4-5. There is heterogeneous signal and enhancement posteriorly at L5-S1 which may be related to degenerative change. Conus medullaris: Extends to the L1-2 level and appears normal. Paraspinal and other soft tissues: Patient is status post left laminectomy at L3. Fluid tracks from the laminectomy site into a subcutaneous and deep soft tissue collection with peripheral enhancement measuring 4.7 x 5.0 x 1.7 cm. The ventral epidural collection extends inferiorly to the L2 level. Separate prominent ventral collection is present posterior to L4 prominently right the left. This collection measures 2.9 x 1.3 cm on the sagittal images. Disc levels: In addition to the epidural collections, disc disease is again noted at L4-5 and L5-S1. Central foraminal narrowing are associated. IMPRESSION: 1. T7 laminectomy subcutaneous peripherally enhancing fluid collection at this level as well. 2. Residual posterior epidural fluid collection in the thoracic spine with similar cephalo caudad extension from T5-6-T9 but less mass effect. 3. Progressive signal  abnormality and enhancement throughout the T7 and T8 vertebral bodies in the T7-8 thoracic disc consistent with disc osteomyelitis. 4. Progressive paraspinous soft tissue enhancement at T3-4 through T9-10 without drainable collection. 5. Persistent extensive ventral epidural collection compatible with abscess. 6. Status post left laminectomy at L3 with peripherally enhancing fluid collection extending from the operative site into the subcutaneous and deep tissues as described. This is concerning abscess. 7. Fluid tracks from the laminectomy site into a subdural deep soft tissue collection measuring 4.7 x 5.0 x 1.7 cm. 8. Separate ventral epidural collection at L4-5 and L5-S1 consistent with epidural abscess. 9. Progressive signal abnormality and enhancement  in the L3 and L4 vertebral bodies. 10. 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 Electronically Signed   By: Marin Roberts M.D.   On: 01/05/2021 21:24   MR Lumbar Spine W Wo Contrast  Result Date: 01/05/2021 CLINICAL DATA:  Spine surgery 2 weeks ago. Increasing pain for the last 2 days. Redness and swelling to the operative site. EXAM: MRI THORACIC AND LUMBAR SPINE WITHOUT AND WITH CONTRAST TECHNIQUE: Multiplanar and multiecho pulse sequences of the thoracic and lumbar spine were obtained without and with intravenous contrast. CONTRAST:  9mL GADAVIST GADOBUTROL 1 MMOL/ML IV SOLN COMPARISON:  None. FINDINGS: MRI THORACIC SPINE FINDINGS Alignment:  No significant listhesis is present. Vertebrae: Diffuse T2 hyperintensity enhancement throughout the T7 and T8 vertebral bodies has increased. Patient is status post T7 laminectomy. Focal enhancement is again noted at the T4 vertebral body. Cord: Cord is displaced posteriorly by a epidural fluid T6-7. Cord size and signal are otherwise normal. Paraspinal and  other soft tissues: Posterior epidural fluid collection is partially decompressed. It extends superiorly to the T5-6 level similar the prior exam. There is less mass effect posteriorly than on the previous study. Collection extends inferiorly to the T9 level. There is an extensive ventral collection extending into the lumbar spine. Abnormal paraspinous soft tissue enhancement extends from T3-4 to T9-10. No drainable collection is present. There is a peripherally enhancing fluid collection the operative bed which measures 6.1 x 1.0 x 2.1 cm. Disc levels: No focal disc disease is present. Abnormal enhancement is present in the foramina on the left T4-5, T5-6, T6-7 T7-8, and T8-9. Right foraminal enhancement is less extensive predominantly involving T6-7, T7-8 T8-9. MRI LUMBAR SPINE FINDINGS Segmentation: 5 non rib-bearing lumbar type vertebral bodies are present. The lowest fully formed vertebral body is L5. Alignment:  No significant listhesis is present. Vertebrae: Progressive signal abnormality and enhancement is present in the vertebral bodies, predominantly at L3 and L4. There is abnormal disc signal without definite disc enhancement at L3-4 and L4-5. There is heterogeneous signal and enhancement posteriorly at L5-S1 which may be related to degenerative change. Conus medullaris: Extends to the L1-2 level and appears normal. Paraspinal and other soft tissues: Patient is status post left laminectomy at L3. Fluid tracks from the laminectomy site into a subcutaneous and deep soft tissue collection with peripheral enhancement measuring 4.7 x 5.0 x 1.7 cm. The ventral epidural collection extends inferiorly to the L2 level. Separate prominent ventral collection is present posterior to L4 prominently right the left. This collection measures 2.9 x 1.3 cm on the sagittal images. Disc levels: In addition to the epidural collections, disc disease is again noted at L4-5 and L5-S1. Central foraminal narrowing are associated.  IMPRESSION: 1. T7 laminectomy subcutaneous peripherally enhancing fluid collection at this level as well. 2. Residual posterior epidural fluid collection in the thoracic spine with similar cephalo caudad extension from T5-6-T9 but less mass effect. 3. Progressive signal abnormality and enhancement throughout the T7 and T8 vertebral bodies in the T7-8 thoracic disc consistent with disc osteomyelitis. 4. Progressive paraspinous soft tissue enhancement at T3-4 through T9-10 without drainable collection. 5. Persistent extensive ventral epidural collection compatible with abscess. 6. Status post left laminectomy at L3 with peripherally enhancing fluid collection extending from the operative site into the subcutaneous and deep tissues as described. This is concerning abscess. 7. Fluid tracks from the laminectomy site into a  subdural deep soft tissue collection measuring 4.7 x 5.0 x 1.7 cm. 8. Separate ventral epidural collection at L4-5 and L5-S1 consistent with epidural abscess. 9. Progressive signal abnormality and enhancement in the L3 and L4 vertebral bodies. 10. 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 Electronically Signed   By: Marin Robertshristopher  Mattern M.D.   On: 01/05/2021 21:24     Imaging independently reviewed in Epic.  Vedia CofferAndrew N Vernie [facility] [phone] pager 01/06/2021, 4:03 PM

## 2021-01-06 NOTE — H&P (Signed)
History and Physical    Raquel Sayres PTW:[id number] DOB: October 02, 1965 DOA: 01/05/2021  PCP: Ray Church, NP   Patient coming from: Home   Chief Complaint: Increased back pain   HPI: Louis [name] is a 55 y.o. male with medical history significant for type 2 diabetes mellitus, hypertension, coronary artery disease with CABG in 2017, anxiety, chronic pain, and recent admission with epidural abscess, vertebral osteomyelitis, psoas abscess, and MRSA bacteremia, now returning to the emergency department with worsening back pain.  Patient reports that his back pain has been stable until approximately 5 days ago when it became more severe and has progressively worsened since then.  He has not noticed any new leg numbness or weakness but did have some urinary incontinence.  Back pain is reported to be severe, constant, worse with movement, and radiating to both legs.  He denies any fevers, chills, or drainage from his surgical wounds.  He missed a dose of antibiotics yesterday but has not missed any other doses.  He had been admitted on 12/12/2020 after he was found to have epidural abscess and vertebral osteomyelitis in [place] where there was no neurosurgery or ID backup.  He underwent laminectomy and evacuation of abscess on 12/21/2020 and then drainage of psoas abscess by IR on 12/22/2020.  Blood cultures from 12/13/2020 grew MRSA, no vegetation was seen on TTE, and blood cultures drawn 12/15/2020 remain negative.  He has been on daptomycin at home.  ED Course: Upon arrival to the ED, patient is found to be saturating well on room air, slightly tachycardic, and with stable blood pressure.  Chemistry panel notable for sodium of 129.  CBC with leukocytosis to 23,900, platelets 681,000, and hemoglobin 8.6.  COVID-19 PCR is negative.  MRI thoracic and lumbar spine reveals multiple fluid collections including ventral epidural abscess at L4-5 with progressive discitis osteomyelitis at L4-5 and L5-S1.  Blood cultures were  repeated in the emergency department, multiple doses of IV analgesics were administered, and the patient was evaluated by neurosurgery in the ED, recommended medical admission with plans for lumbar decompression.   Review of Systems:  All other systems reviewed and apart from HPI, are negative.  Past Medical History:  Diagnosis Date   CAD (coronary artery disease)    DM (diabetes mellitus) ([place])    Hyperlipidemia    Hypertension     Past Surgical History:  Procedure Laterality Date   LUMBAR LAMINECTOMY FOR EPIDURAL ABSCESS N/A 12/21/2020   Procedure: Thoracic seven-eight, Thoracic twelve-Lumbar one, Lumbar three-four laminectomy for Epidural Abscess;  Surgeon: Dawley, Theodoro Doing, DO;  Location: [facility];  Service: Neurosurgery;  Laterality: N/A;    Social History:   reports that he has been smoking cigarettes. He started smoking about 25 years ago. He has been smoking an average of 2 packs per day. He has never used smokeless tobacco. He reports previous alcohol use. He reports that he does not use drugs.  No Known Allergies  History reviewed. No pertinent family history.   Prior to Admission medications   Medication Sig Start Date End Date Taking? Authorizing Provider  acetaminophen (TYLENOL) 325 MG tablet Take 2 tablets (650 mg total) by mouth every 6 (six) hours as needed for mild pain (or Fever >/= 101). 12/25/20  Yes Little Ishikawa, MD  ALPRAZolam Duanne Moron) 0.25 MG tablet Take 0.25 mg by mouth 2 (two) times daily as needed for anxiety.   Yes [provider]  aspirin EC 81 MG tablet Take 81 mg by mouth daily. Swallow  whole.   Yes [provider]  atorvastatin (LIPITOR) 40 MG tablet Take 1 tablet (40 mg total) by mouth at bedtime. 12/25/20  Yes Little Ishikawa, MD  dapagliflozin propanediol (FARXIGA) 10 MG TABS tablet Take 10 mg by mouth every morning.   Yes [provider]  daptomycin (CUBICIN) IVPB Inject 750 mg into the vein daily. Indication:  MRSA  bacteremia/lumbar osteomyelitis  First Dose: Yes Last Day of Therapy:  02/02/21 Labs - Once weekly:  CBC/D, BMP, and CPK Labs - Every other week:  ESR and CRP Method of administration: IV Push Method of administration may be changed at the discretion of home infusion pharmacist based upon assessment of the patient and/or caregiver's ability to self-administer the medication ordered. 12/25/20 02/04/21 Yes Little Ishikawa, MD  diclofenac Sodium (VOLTAREN) 1 % GEL Apply 1 application topically 4 (four) times daily as needed (pain).   Yes [provider]  furosemide (LASIX) 20 MG tablet Take 20 mg by mouth every morning.   Yes [provider]  gabapentin (NEURONTIN) 100 MG capsule Take 2 capsules (200 mg total) by mouth 3 (three) times daily. 12/25/20  Yes Little Ishikawa, MD  glipiZIDE (GLUCOTROL) 5 MG tablet Take 5 mg by mouth 2 (two) times daily.   Yes [provider]  icosapent Ethyl (VASCEPA) 1 g capsule Take 2 g by mouth 2 (two) times daily.   Yes [provider]  lactulose (CHRONULAC) 10 GM/15ML solution Take 15 mLs (10 g total) by mouth daily. 12/26/20  Yes Little Ishikawa, MD  lidocaine (LIDODERM) 5 % Place 1 patch onto the skin daily as needed (pain). Remove & Discard patch within 12 hours or as directed by MD   Yes [provider]  losartan (COZAAR) 25 MG tablet Take 25 mg by mouth every morning.   Yes [provider]  metFORMIN (GLUCOPHAGE) 500 MG tablet Take 500-1,000 mg by mouth See admin instructions. Take 2 tablets (1000 mg) by mouth every morning and 1 tablet (500 mg) at night   Yes [provider]  methocarbamol (ROBAXIN) 500 MG tablet Take 1 tablet (500 mg total) by mouth every 6 (six) hours as needed for muscle spasms. 12/25/20  Yes Little Ishikawa, MD  metoprolol tartrate (LOPRESSOR) 25 MG tablet Take 25 mg by mouth at bedtime. 11/26/20  Yes [provider]  omeprazole (PRILOSEC) 20 MG capsule Take 20  mg by mouth every morning.   Yes [provider]  oxyCODONE (OXYCONTIN) 20 mg 12 hr tablet Take 1 tablet (20 mg total) by mouth every 12 (twelve) hours. 12/25/20  Yes Little Ishikawa, MD  oxyCODONE (ROXICODONE) 15 MG immediate release tablet Take 1 tablet (15 mg total) by mouth every 3 (three) hours as needed for severe pain ((score 7 to 10)). 12/25/20  Yes Little Ishikawa, MD  polyethylene glycol (MIRALAX / GLYCOLAX) 17 g packet Take 17 g by mouth 2 (two) times daily. 12/25/20  Yes Little Ishikawa, MD  venlafaxine XR (EFFEXOR-XR) 75 MG 24 hr capsule Take 75 mg by mouth every morning.   Yes [provider]    Physical Exam: Vitals:   01/05/21 1750 01/05/21 1919 01/05/21 2133 01/06/21 0100  BP: (!) 155/94 (!) 152/77 (!) 141/80 (!) 149/83  Pulse: (!) 107 (!) 111 (!) 111 (!) 106  Resp: $Remo'17 17 20 'VkscT$ (!) 21  SpO2: 93% 97% 97% 91%    Constitutional: NAD, calm  Eyes: PERTLA, lids and conjunctivae normal ENMT: Mucous  membranes are moist. Posterior pharynx clear of any exudate or lesions.   Neck: supple, no masses  Respiratory:  no wheezing, no crackles. No accessory muscle use.  Cardiovascular: S1 & S2 heard, regular rate and rhythm. No extremity edema.   Abdomen: No distension, no tenderness, soft. Bowel sounds active.  Musculoskeletal: no clubbing / cyanosis. No joint deformity upper and lower extremities.   Skin: no significant rashes, lesions, ulcers. Warm, dry, well-perfused. Neurologic: CN 2-12 grossly intact. Sensation intact. Moving all extremities.  Psychiatric: Alert and oriented to person, place, and situation. Pleasant and cooperative.    Labs and Imaging on Admission: I have personally reviewed following labs and imaging studies  CBC: Recent Labs  Lab 01/05/21 1705  WBC 23.9*  NEUTROABS 20.5*  HGB 8.6*  HCT 25.9*  MCV 88.1  PLT 409*   Basic Metabolic Panel: Recent Labs  Lab 01/05/21 1705  NA 129*  K 4.3  CL 91*  CO2 24  GLUCOSE 104*  BUN  7  CREATININE 0.74  CALCIUM 8.0*   GFR: Estimated Creatinine Clearance: 121.3 mL/min (by C-G formula based on SCr of 0.74 mg/dL). Liver Function Tests: No results for input(s): AST, ALT, ALKPHOS, BILITOT, PROT, ALBUMIN in the last 168 hours. No results for input(s): LIPASE, AMYLASE in the last 168 hours. No results for input(s): AMMONIA in the last 168 hours. Coagulation Profile: No results for input(s): INR, PROTIME in the last 168 hours. Cardiac Enzymes: No results for input(s): CKTOTAL, CKMB, CKMBINDEX, TROPONINI in the last 168 hours. BNP (last 3 results) No results for input(s): PROBNP in the last 8760 hours. HbA1C: No results for input(s): HGBA1C in the last 72 hours. CBG: No results for input(s): GLUCAP in the last 168 hours. Lipid Profile: No results for input(s): CHOL, HDL, LDLCALC, TRIG, CHOLHDL, LDLDIRECT in the last 72 hours. Thyroid Function Tests: No results for input(s): TSH, T4TOTAL, FREET4, T3FREE, THYROIDAB in the last 72 hours. Anemia Panel: No results for input(s): VITAMINB12, FOLATE, FERRITIN, TIBC, IRON, RETICCTPCT in the last 72 hours. Urine analysis:    Component Value Date/Time   COLORURINE YELLOW 12/16/2020 1615   APPEARANCEUR CLEAR 12/16/2020 1615   LABSPEC 1.018 12/16/2020 1615   PHURINE 6.0 12/16/2020 1615   GLUCOSEU >=500 (A) 12/16/2020 1615   HGBUR LARGE (A) 12/16/2020 1615   BILIRUBINUR NEGATIVE 12/16/2020 1615   KETONESUR 20 (A) 12/16/2020 1615   PROTEINUR 30 (A) 12/16/2020 1615   NITRITE NEGATIVE 12/16/2020 1615   LEUKOCYTESUR NEGATIVE 12/16/2020 1615   Sepsis Labs: $RemoveBefo'@[id number]'GVMnrbctqAV$ (procalcitonin:4,lacticidven:4) ) Recent Results (from the past 240 hour(s))  Resp Panel by RT-PCR (Flu A&B, Covid) Nasopharyngeal Swab     Status: None   Collection Time: 01/05/21  9:32 PM   Specimen: Nasopharyngeal Swab; Nasopharyngeal(NP) swabs in vial transport medium  Result Value Ref Range Status   SARS Coronavirus 2 by RT PCR NEGATIVE NEGATIVE Final     Comment: (NOTE) SARS-CoV-2 target nucleic acids are NOT DETECTED.  The SARS-CoV-2 RNA is generally detectable in upper respiratory specimens during the acute phase of infection. The lowest concentration of SARS-CoV-2 viral copies this assay can detect is 138 copies/mL. A negative result does not preclude SARS-Cov-2 infection and should not be used as the sole basis for treatment or other patient management decisions. A negative result may occur with  improper specimen collection/handling, submission of specimen other than nasopharyngeal swab, presence of viral mutation(s) within the areas targeted by this assay, and inadequate number of viral copies(<138 copies/mL). A negative result must be  combined with clinical observations, patient history, and epidemiological information. The expected result is Negative.  Fact Sheet for Patients:  EntrepreneurPulse.com.au  Fact Sheet for Healthcare Providers:  IncredibleEmployment.be  This test is no t yet approved or cleared by the [place] FDA and  has been authorized for detection and/or diagnosis of SARS-CoV-2 by FDA under an Emergency Use Authorization (EUA). This EUA will remain  in effect (meaning this test can be used) for the duration of the COVID-19 declaration under Section 564(b)(1) of the Act, 21 U.S.C.section 360bbb-3(b)(1), unless the authorization is terminated  or revoked sooner.       Influenza A by PCR NEGATIVE NEGATIVE Final   Influenza B by PCR NEGATIVE NEGATIVE Final    Comment: (NOTE) The Xpert Xpress SARS-CoV-2/FLU/RSV plus assay is intended as an aid in the diagnosis of influenza from Nasopharyngeal swab specimens and should not be used as a sole basis for treatment. Nasal washings and aspirates are unacceptable for Xpert Xpress SARS-CoV-2/FLU/RSV testing.  Fact Sheet for Patients: EntrepreneurPulse.com.au  Fact Sheet for Healthcare  Providers: IncredibleEmployment.be  This test is not yet approved or cleared by the [place] FDA and has been authorized for detection and/or diagnosis of SARS-CoV-2 by FDA under an Emergency Use Authorization (EUA). This EUA will remain in effect (meaning this test can be used) for the duration of the COVID-19 declaration under Section 564(b)(1) of the Act, 21 U.S.C. section 360bbb-3(b)(1), unless the authorization is terminated or revoked.  Performed at [facility], [place] [address] [place], [place] [address]      Radiological Exams on Admission: MR THORACIC SPINE W WO CONTRAST  Result Date: 01/05/2021 CLINICAL DATA:  Spine surgery 2 weeks ago. Increasing pain for the last 2 days. Redness and swelling to the operative site. EXAM: MRI THORACIC AND LUMBAR SPINE WITHOUT AND WITH CONTRAST TECHNIQUE: Multiplanar and multiecho pulse sequences of the thoracic and lumbar spine were obtained without and with intravenous contrast. CONTRAST:  75mL GADAVIST GADOBUTROL 1 MMOL/ML IV SOLN COMPARISON:  None. FINDINGS: MRI THORACIC SPINE FINDINGS Alignment:  No significant listhesis is present. Vertebrae: Diffuse T2 hyperintensity enhancement throughout the T7 and T8 vertebral bodies has increased. Patient is status post T7 laminectomy. Focal enhancement is again noted at the T4 vertebral body. Cord: Cord is displaced posteriorly by a epidural fluid T6-7. Cord size and signal are otherwise normal. Paraspinal and other soft tissues: Posterior epidural fluid collection is partially decompressed. It extends superiorly to the T5-6 level similar the prior exam. There is less mass effect posteriorly than on the previous study. Collection extends inferiorly to the T9 level. There is an extensive ventral collection extending into the lumbar spine. Abnormal paraspinous soft tissue enhancement extends from T3-4 to T9-10. No drainable collection is present. There is a peripherally enhancing  fluid collection the operative bed which measures 6.1 x 1.0 x 2.1 cm. Disc levels: No focal disc disease is present. Abnormal enhancement is present in the foramina on the left T4-5, T5-6, T6-7 T7-8, and T8-9. Right foraminal enhancement is less extensive predominantly involving T6-7, T7-8 T8-9. MRI LUMBAR SPINE FINDINGS Segmentation: 5 non rib-bearing lumbar type vertebral bodies are present. The lowest fully formed vertebral body is L5. Alignment:  No significant listhesis is present. Vertebrae: Progressive signal abnormality and enhancement is present in the vertebral bodies, predominantly at L3 and L4. There is abnormal disc signal without definite disc enhancement at L3-4 and L4-5. There is heterogeneous signal and enhancement posteriorly at L5-S1 which may be related  to degenerative change. Conus medullaris: Extends to the L1-2 level and appears normal. Paraspinal and other soft tissues: Patient is status post left laminectomy at L3. Fluid tracks from the laminectomy site into a subcutaneous and deep soft tissue collection with peripheral enhancement measuring 4.7 x 5.0 x 1.7 cm. The ventral epidural collection extends inferiorly to the L2 level. Separate prominent ventral collection is present posterior to L4 prominently right the left. This collection measures 2.9 x 1.3 cm on the sagittal images. Disc levels: In addition to the epidural collections, disc disease is again noted at L4-5 and L5-S1. Central foraminal narrowing are associated. IMPRESSION: 1. T7 laminectomy subcutaneous peripherally enhancing fluid collection at this level as well. 2. Residual posterior epidural fluid collection in the thoracic spine with similar cephalo caudad extension from T5-6-T9 but less mass effect. 3. Progressive signal abnormality and enhancement throughout the T7 and T8 vertebral bodies in the T7-8 thoracic disc consistent with disc osteomyelitis. 4. Progressive paraspinous soft tissue enhancement at T3-4 through T9-10  without drainable collection. 5. Persistent extensive ventral epidural collection compatible with abscess. 6. Status post left laminectomy at L3 with peripherally enhancing fluid collection extending from the operative site into the subcutaneous and deep tissues as described. This is concerning abscess. 7. Fluid tracks from the laminectomy site into a subdural deep soft tissue collection measuring 4.7 x 5.0 x 1.7 cm. 8. Separate ventral epidural collection at L4-5 and L5-S1 consistent with epidural abscess. 9. Progressive signal abnormality and enhancement in the L3 and L4 vertebral bodies. 10. 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 Electronically Signed   By: San Morelle M.D.   On: 01/05/2021 21:24   MR Lumbar Spine W Wo Contrast  Result Date: 01/05/2021 CLINICAL DATA:  Spine surgery 2 weeks ago. Increasing pain for the last 2 days. Redness and swelling to the operative site. EXAM: MRI THORACIC AND LUMBAR SPINE WITHOUT AND WITH CONTRAST TECHNIQUE: Multiplanar and multiecho pulse sequences of the thoracic and lumbar spine were obtained without and with intravenous contrast. CONTRAST:  29mL GADAVIST GADOBUTROL 1 MMOL/ML IV SOLN COMPARISON:  None. FINDINGS: MRI THORACIC SPINE FINDINGS Alignment:  No significant listhesis is present. Vertebrae: Diffuse T2 hyperintensity enhancement throughout the T7 and T8 vertebral bodies has increased. Patient is status post T7 laminectomy. Focal enhancement is again noted at the T4 vertebral body. Cord: Cord is displaced posteriorly by a epidural fluid T6-7. Cord size and signal are otherwise normal. Paraspinal and other soft tissues: Posterior epidural fluid collection is partially decompressed. It extends superiorly to the T5-6 level similar the prior exam. There is less mass effect posteriorly than on the previous  study. Collection extends inferiorly to the T9 level. There is an extensive ventral collection extending into the lumbar spine. Abnormal paraspinous soft tissue enhancement extends from T3-4 to T9-10. No drainable collection is present. There is a peripherally enhancing fluid collection the operative bed which measures 6.1 x 1.0 x 2.1 cm. Disc levels: No focal disc disease is present. Abnormal enhancement is present in the foramina on the left T4-5, T5-6, T6-7 T7-8, and T8-9. Right foraminal enhancement is less extensive predominantly involving T6-7, T7-8 T8-9. MRI LUMBAR SPINE FINDINGS Segmentation: 5 non rib-bearing lumbar type vertebral bodies are present. The lowest fully formed vertebral body is L5. Alignment:  No significant listhesis is present. Vertebrae: Progressive signal abnormality and enhancement is present in the vertebral bodies,  predominantly at L3 and L4. There is abnormal disc signal without definite disc enhancement at L3-4 and L4-5. There is heterogeneous signal and enhancement posteriorly at L5-S1 which may be related to degenerative change. Conus medullaris: Extends to the L1-2 level and appears normal. Paraspinal and other soft tissues: Patient is status post left laminectomy at L3. Fluid tracks from the laminectomy site into a subcutaneous and deep soft tissue collection with peripheral enhancement measuring 4.7 x 5.0 x 1.7 cm. The ventral epidural collection extends inferiorly to the L2 level. Separate prominent ventral collection is present posterior to L4 prominently right the left. This collection measures 2.9 x 1.3 cm on the sagittal images. Disc levels: In addition to the epidural collections, disc disease is again noted at L4-5 and L5-S1. Central foraminal narrowing are associated. IMPRESSION: 1. T7 laminectomy subcutaneous peripherally enhancing fluid collection at this level as well. 2. Residual posterior epidural fluid collection in the thoracic spine with similar cephalo caudad  extension from T5-6-T9 but less mass effect. 3. Progressive signal abnormality and enhancement throughout the T7 and T8 vertebral bodies in the T7-8 thoracic disc consistent with disc osteomyelitis. 4. Progressive paraspinous soft tissue enhancement at T3-4 through T9-10 without drainable collection. 5. Persistent extensive ventral epidural collection compatible with abscess. 6. Status post left laminectomy at L3 with peripherally enhancing fluid collection extending from the operative site into the subcutaneous and deep tissues as described. This is concerning abscess. 7. Fluid tracks from the laminectomy site into a subdural deep soft tissue collection measuring 4.7 x 5.0 x 1.7 cm. 8. Separate ventral epidural collection at L4-5 and L5-S1 consistent with epidural abscess. 9. Progressive signal abnormality and enhancement in the L3 and L4 vertebral bodies. 10. 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 Electronically Signed   By: San Morelle M.D.   On: 01/05/2021 21:24     Assessment/Plan   1. Epidural abscess; discitis/osteomyelitis; MRSA bacteremia     - Pt with laminectomy & evacuation of abscess 12/21/20, IR drainage of psoas abscess 6/29, and MRSA bacteremia currently on daptomycin presents with worsening back pain and found on MRI L- and T-spine to have multiple fluid collections including ventral epidural abscess and progressive osteomyelitis at L 4-5  - Blood cultures grew MRSA on 6/20, were negative 6/22, no vegetation seen on TTE 12/15/20  - Neurosurgery planning for L4-5 decompression 01/06/21  - Continue NPO, IV antibiotics, and pain-control   2. Anemia  - Hgb is 8.6, down from 11.0 on 12/29/20  - No overt bleeding  - Check anemia panel, monitor    3. Type II DM  - A1c was 9.1% in June 2022  - Check CBGs and use SSI for now    4.  CAD  - No anginal complaints  - Hold ASA preoperatively, continue statin and beta-blocker    5. Hyponatremia  - Serum sodium 129 on admission  - Appears chronic, possibly related to his SNRI   - Check urine sodium and osm    6. Chronic diastolic CHF  - Appears hypovolemic on admission  - Hold Lasix, start gentle IVF hydration, monitor volume status    7. Anxiety  - Continue Effexor and as-needed Xanax     DVT prophylaxis: SCDs  Code Status: Full  Level of Care: Level of care: Progressive Family Communication: Wife updated from ED  Disposition Plan:  Patient is from: Home  Anticipated d/c is to: TBD Anticipated d/c date is: 01/10/21 Patient currently: Needs inpatient surgery  Consults called: Neurosurgery  Admission status: inpatient     Vianne Bulls, MD [facility]  01/06/2021, 2:49 AM

## 2021-01-06 NOTE — Progress Notes (Signed)
Pharmacy Antibiotic Note  Louis [name] is a 55 y.o. male admitted on 01/05/2021 with increased back pain, has a known and recent history of MRSA bacteremia and epidural abscess, vertebral osteomyelitis and psoas abscess. He was seen by ID during an admission in June where he underwent neurosurgery to evacuate his epidural abscess. He was put on daptomycin as an outpatient to complete therapy in late July but he is presenting now with worsening symptoms and positive blood cultures.  Pharmacy has been consulted for vancomycin dosing.  Population kinetics: Vd 70.3, ke: 0.094 T1/2 7.3 hours. Projected AUC of 452 on 1500mg  q12h  Plan: Vancomycin 2000mg  x1 Vancomycin 1500mg  IV q12h  F/u daily Scr and levels at SS     Temp (24hrs), Avg:97.6 F (36.4 C), Min:97.4 F (36.3 C), Max:98 F (36.7 C)  Recent Labs  Lab 01/05/21 1705 01/06/21 0310  WBC 23.9* 31.7*  CREATININE 0.74 0.74    Estimated Creatinine Clearance: 121.3 mL/min (by C-G formula based on SCr of 0.74 mg/dL).    No Known Allergies  Antimicrobials this admission: Vancomycin 7/14>>   Microbiology results: 7/13 BCx: GPC 3/4    Thank you for allowing pharmacy to be a part of this patient's care.  01/08/21 01/06/2021 3:12 PM

## 2021-01-06 NOTE — ED Notes (Signed)
Charge nurse notified of pt condition. Pt moved to ED 6 for better visibility from nurse's station

## 2021-01-06 NOTE — ED Notes (Signed)
Pt has again self d/c cardiac monitor, bp cuff, pulse oximeter as well as taken off gown and external male catheter.  Bed wet. Bed changed, brief applied and cardiac monitor and components reconnected.

## 2021-01-06 NOTE — ED Provider Notes (Signed)
Case discussed with Dr. Antionette Char with [facility] to admit to the hospital    Zadie Rhine, MD 01/06/21 [phone]

## 2021-01-06 NOTE — ED Notes (Signed)
Pharmacy notified for oxycodone verification (past due, unverified and pt in pain)

## 2021-01-06 NOTE — ED Notes (Signed)
Pt heard yelling in room from nurse's station. Upon entering the room, pt found out of bed kneeling on the ground at the foot of the bed. Pt able to answer orientation questions, with the exception of time. Pt continuing to state "The devil is going to get me". Pt had bowel movement during this episode. Pt cleaned and linen changed. Posey bed alarm placed under patient and redirected to surroundings. Pt given instructions on how to use the call bell.

## 2021-01-06 NOTE — Progress Notes (Signed)
Pharmacy Antibiotic Note  Louis [name] is a 55 y.o. male admitted on 01/05/2021 with  MRSA bacteremia/lumbar osteomyelitis  .  Pharmacy has been consulted for daptomycin dosing, continuing outpatient therapy.  Plan: Continue daptomycin 750mg  IV Q24H until 02/02/21.   Recent Labs  Lab 01/05/21 1705  WBC 23.9*  CREATININE 0.74    Estimated Creatinine Clearance: 121.3 mL/min (by C-G formula based on SCr of 0.74 mg/dL).    No Known Allergies   Thank you for allowing pharmacy to be a part of this patient's care.  01/07/21, PharmD, BCPS  01/06/2021 2:32 AM

## 2021-01-06 NOTE — Progress Notes (Signed)
   Providing Compassionate, Quality Care - Together  NEUROSURGERY PROGRESS NOTE   S: Patient seen and examined in the emergency department.  Currently complains of moderate to severe lower thoracic, lumbar back pain with some radiating pain into his posterior thighs.  Denies numbness, tingling in his legs, denies groin or saddle anesthesia.  O: EXAM:  BP (!) 170/97   Pulse (!) 110   Temp (!) 97.4 F (36.3 C) (Oral)   Resp 18   SpO2 97%   Awake, alert, oriented x2 Moderate distress secondary to pain PERRLA Speech fluent, appropriate  CNs grossly intact ` 5/5 BUE 4+/5 BLE 3 midline thoracic and lumbar incisions.  All healing appropriately.  There is mild fluctuance in the lower incision with some granulation tissue however no active drainage or expressible fluid.  No significant erythema around incisions.  Some Dermabond scabbing remains on all 3.   ASSESSMENT:  55 y.o. 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  I discussed with her the findings of the MRI.  I reviewed them extensively, and my partner Dr. Wynetta Emery did as well.  The thoracic mass-effect is improved.  There remains persistent ostediscitis and epidural abscess throughout the thoracolumbar spine.  There is possibly a more loculated focus of abscess at L4-5 with stenosis however he has no focal weakness, denies groin numbness or numbness and tingling in his legs and his postvoid residual appears appropriate therefore I do not believe urgent or emergent decompression is needed at this time.  Certainly it would not help control his infection.  We will monitor his wounds closely. -Per his wife, he has had many episodes of confusion on and off since he left the hospital.  She is concerned by his episodes of confusion however there are many times where he is lucid and appropriately  communicative.  Today he was aware of where he was and who he was but was confused by the date. -I will obtain an MRI of the brain with and without contrast given his continued confusion, my concern would be some cerebral spread of infection. -I explained to the wife that unfortunately he has failed multiple medical treatments now and likely will need broadened antibiotic therapy however this is up to the infectious disease specialist.  I explained to her that this is a significant disease for him and unfortunately has been unresponsive to medical treatment. -I discussed this at length with Dr. Wynetta Emery as well.  He agrees that there is stenosis at L4-5 which could be decompressed however neurologically he is doing well and has no focal deficits.  Certainly this would not control his infectious disease process.  Therefore I believe we will continue to support him aggressively medically and follow along with close monitoring -Guarded prognosis  -Post void residual 191 cc    Thank you for allowing me to participate in this patient's care.  Please do not hesitate to call with questions or concerns.   Monia Pouch, DO Neurosurgeon [facility] Cell: [phone]

## 2021-01-07 ENCOUNTER — Inpatient Hospital Stay (HOSPITAL_COMMUNITY): Payer: Federal, State, Local not specified - PPO

## 2021-01-07 DIAGNOSIS — F419 Anxiety disorder, unspecified: Secondary | ICD-10-CM | POA: Diagnosis not present

## 2021-01-07 DIAGNOSIS — I2510 Atherosclerotic heart disease of native coronary artery without angina pectoris: Secondary | ICD-10-CM | POA: Diagnosis not present

## 2021-01-07 DIAGNOSIS — G062 Extradural and subdural abscess, unspecified: Secondary | ICD-10-CM | POA: Diagnosis not present

## 2021-01-07 DIAGNOSIS — B9562 Methicillin resistant Staphylococcus aureus infection as the cause of diseases classified elsewhere: Secondary | ICD-10-CM | POA: Diagnosis not present

## 2021-01-07 DIAGNOSIS — R7881 Bacteremia: Secondary | ICD-10-CM | POA: Diagnosis not present

## 2021-01-07 LAB — COMPREHENSIVE METABOLIC PANEL
ALT: 16 U/L (ref 0–44)
AST: 21 U/L (ref 15–41)
Albumin: 1.7 g/dL — ABNORMAL LOW (ref 3.5–5.0)
Alkaline Phosphatase: 127 U/L — ABNORMAL HIGH (ref 38–126)
Anion gap: 20 — ABNORMAL HIGH (ref 5–15)
BUN: 9 mg/dL (ref 6–20)
CO2: 14 mmol/L — ABNORMAL LOW (ref 22–32)
Calcium: 8.5 mg/dL — ABNORMAL LOW (ref 8.9–10.3)
Chloride: 100 mmol/L (ref 98–111)
Creatinine, Ser: 0.96 mg/dL (ref 0.61–1.24)
GFR, Estimated: >60 mL/min (ref >60)
Glucose, Bld: 138 mg/dL — ABNORMAL HIGH (ref 70–99)
Potassium: 4.4 mmol/L (ref 3.5–5.1)
Sodium: 134 mmol/L — ABNORMAL LOW (ref 135–145)
Total Bilirubin: 1.5 mg/dL — ABNORMAL HIGH (ref 0.3–1.2)
Total Protein: 6.1 g/dL — ABNORMAL LOW (ref 6.5–8.1)

## 2021-01-07 LAB — GLUCOSE, CAPILLARY
Glucose-Capillary: 128 mg/dL — ABNORMAL HIGH (ref 70–99)
Glucose-Capillary: 141 mg/dL — ABNORMAL HIGH (ref 70–99)
Glucose-Capillary: 144 mg/dL — ABNORMAL HIGH (ref 70–99)
Glucose-Capillary: 150 mg/dL — ABNORMAL HIGH (ref 70–99)
Glucose-Capillary: 156 mg/dL — ABNORMAL HIGH (ref 70–99)
Glucose-Capillary: 176 mg/dL — ABNORMAL HIGH (ref 70–99)

## 2021-01-07 LAB — ECHOCARDIOGRAM COMPLETE
AR max vel: 6.5 cm2
AV Area VTI: 5.53 cm2
AV Area mean vel: 5.74 cm2
AV Mean grad: 2 mmHg
AV Peak grad: 3.1 mmHg
Ao pk vel: 0.88 m/s
S' Lateral: 3.4 cm

## 2021-01-07 LAB — RAPID URINE DRUG SCREEN, HOSP PERFORMED
Amphetamines: NOT DETECTED
Barbiturates: NOT DETECTED
Benzodiazepines: NOT DETECTED
Cocaine: NOT DETECTED
Opiates: POSITIVE — AB
Tetrahydrocannabinol: NOT DETECTED

## 2021-01-07 LAB — CBC WITH DIFFERENTIAL/PLATELET
Abs Immature Granulocytes: 0.99 10*3/uL — ABNORMAL HIGH (ref 0.00–0.07)
Basophils Absolute: 0.2 10*3/uL — ABNORMAL HIGH (ref 0.0–0.1)
Basophils Relative: 1 %
Eosinophils Absolute: 0 10*3/uL (ref 0.0–0.5)
Eosinophils Relative: 0 %
HCT: 35.5 % — ABNORMAL LOW (ref 39.0–52.0)
Hemoglobin: 11.4 g/dL — ABNORMAL LOW (ref 13.0–17.0)
Immature Granulocytes: 3 %
Lymphocytes Relative: 6 %
Lymphs Abs: 1.8 10*3/uL (ref 0.7–4.0)
MCH: 29 pg (ref 26.0–34.0)
MCHC: 32.1 g/dL (ref 30.0–36.0)
MCV: 90.3 fL (ref 80.0–100.0)
Monocytes Absolute: 1.7 10*3/uL — ABNORMAL HIGH (ref 0.1–1.0)
Monocytes Relative: 6 %
Neutro Abs: 25.8 10*3/uL — ABNORMAL HIGH (ref 1.7–7.7)
Neutrophils Relative %: 84 %
Platelets: 863 10*3/uL — ABNORMAL HIGH (ref 150–400)
RBC: 3.93 MIL/uL — ABNORMAL LOW (ref 4.22–5.81)
RDW: 15.9 % — ABNORMAL HIGH (ref 11.5–15.5)
WBC: 30.5 10*3/uL — ABNORMAL HIGH (ref 4.0–10.5)
nRBC: 0 % (ref 0.0–0.2)

## 2021-01-07 LAB — MAGNESIUM: Magnesium: 1.6 mg/dL — ABNORMAL LOW (ref 1.7–2.4)

## 2021-01-07 LAB — C-REACTIVE PROTEIN: CRP: 27.8 mg/dL — ABNORMAL HIGH (ref <1.0)

## 2021-01-07 LAB — PHOSPHORUS: Phosphorus: 4 mg/dL (ref 2.5–4.6)

## 2021-01-07 LAB — SEDIMENTATION RATE: Sed Rate: 62 mm/hr — ABNORMAL HIGH (ref 0–16)

## 2021-01-07 MED ORDER — LABETALOL HCL 5 MG/ML IV SOLN
10.0000 mg | INTRAVENOUS | Status: DC | PRN
  Administered 2021-01-07 (×2): 10 mg via INTRAVENOUS
  Filled 2021-01-07 (×2): qty 4

## 2021-01-07 MED ORDER — MAGNESIUM SULFATE 2 GM/50ML IV SOLN
2.0000 g | Freq: Once | INTRAVENOUS | Status: AC
  Administered 2021-01-07: 2 g via INTRAVENOUS
  Filled 2021-01-07: qty 50

## 2021-01-07 MED ORDER — SODIUM CHLORIDE 0.9 % IV BOLUS: 1000.0000 mL | Freq: Once | INTRAVENOUS | Status: DC

## 2021-01-07 MED ORDER — IOHEXOL 300 MG/ML  SOLN
75.0000 mL | Freq: Once | INTRAMUSCULAR | Status: AC | PRN
  Administered 2021-01-07: 75 mL via INTRAVENOUS

## 2021-01-07 MED ORDER — SODIUM BICARBONATE 8.4 % IV SOLN
INTRAVENOUS | Status: DC
  Filled 2021-01-07 (×2): qty 1000

## 2021-01-07 MED ORDER — SODIUM CHLORIDE 0.9 % IV BOLUS: 500.0000 mL | Freq: Once | INTRAVENOUS | Status: DC

## 2021-01-07 NOTE — Progress Notes (Signed)
PROGRESS NOTE    Louis [name]  LKG:[id number] DOB: 1966/05/08 DOA: 01/05/2021 PCP: Ray Church, NP   Brief Narrative:  Patient is a 55 year old Caucasian male with a past medical history significant for but not limited to type 2 diabetes mellitus, hypertension, history of CAD with CABG in 2017, anxiety, chronic pain as well as other comorbidities with recent admission with an epidural abscess, vertebral osteomyelitis, psoas abscess and MRSA bacteremia now returning to the ED with worsening back pain.  He reported that his back pain has been stable until approximately 5 days ago when it became more severe and progressively worsened since then.  He did not notice any new leg numbness or weakness did have some urinary incontinence.  Back pain was reportedly severe and constant worse with movement and radiating to both his legs.  He denied any fevers or chills but stated he did have some mild drainage from his surgical wounds.  He missed his dose of antibiotics yesterday but has not missed any other doses.  He been admitted on 12/12/2020 after he is found to have an epidural abscess and vertebral osteomyelitis and after over there is no neurosurgery or ID backup.  He underwent a laminectomy and evacuation of abscess on 12/13/2020 and then drainage of the psoas abscess by IR on 12/22/2020.  Blood cultures from 12/14/2018 grew out MRSA but there is no vegetation seen on TTE and blood cultures were drawn and repeated 12/15/2020 remain negative.  He has been on daptomycin at home.  Upon arrival to ED is found to be saturating well on room air but he did have a leukocytosis 23,900 and platelet count of 681,000.  COVID PCR was negative.  He had an MRI of the thoracic and lumbar spine which revealed multiple fluid collections involving the ventral epidural abscess at the L4-L5 level with progressive discitis and osteomyelitis at the L4-L5 and L5-S1 levels.  Blood cultures were repeated in the ED and he was given multiple  doses of IV analgesics and he is evaluated by neurosurgery who recommended medical admission.  Initial plan was for lumbar decompression however Dr. Reatha Armour of neurosurgery evaluated and felt that his thoracic mass-effect was improved and that even though there was persistent ostial discitis and epidural abscess throughout the thoracolumbar spine there is possibly a more loculated focus of the abscess at L4-L5 with stenosis but no focal weakness, groin numbness or numbness and tingling in his legs he did not believe that urgent or emergent decompression was needed at this time.  Patient has been confused more often per his wife and neurosurgery is obtaining MRI of the brain with and without contrast given concern of some cerebral spread of infection.  Because he has failed multiple medical treatments will likely need broaden antibiotic therapy and will consult ID and neurosurgery feels that the stenosis L4-L5 could be decompressed however neurologically patient is doing well with no focal deficits and therefore they are recommending aggressive medical and supportive care.  Assessment & Plan:   Principal Problem:   MRSA bacteremia Active Problems:   CAD (coronary artery disease)   HTN (hypertension)   Uncontrolled diabetes mellitus (HCC)   Epidural abscess   Normocytic anemia   Hyponatremia   Anxiety   Chronic diastolic CHF (congestive heart failure) (HCC)  Sepsis secondary to epidural abscess; discitis/osteomyelitis; MRSA bacteremia     -Patient met sepsis physiology on admission as he was tachycardic, tachypneic, had a leukocytosis and no source of infection -Pt with laminectomy & evacuation of  abscess 12/21/20, IR drainage of psoas abscess 6/29, and MRSA bacteremia currently on daptomycin presents with worsening back pain and found on MRI L- and T-spine to have multiple fluid collections including ventral epidural abscess and progressive osteomyelitis at L 4-5 -Blood cultures grew MRSA on 6/20,  were negative 6/22, no vegetation seen on TTE 12/15/20  - Neurosurgery was planning for L4-5 decompression 01/06/21 but re-evaluated and since patient is Neurologically intact will hold off and treat medically do not feel that urgent or emergent decompression is needed at this time -Give another 1-1/2 L bolus today and start the patient on sodium bicarb at 75 MLS per hour -Patient continues to be tachycardic and will continue with metoprolol tartrate 25 mg p.o. nightly and also start labetalol IV -WBC is gone from 23.9 is now 31.7 -> 30.5 -CRP went from 31.1 -> 27.8 -Continue IV antibiotics, and pain-control  -Check a UDS -We will formally consult ID for further evaluation recommendations; ID recommending stopping daptomycin and switching to vancomycin per pharmacy to dose.  We will order a TTE and if this is negative proceeding with a TEE -ID is also ordered a CT of the pelvis with contrast to further evaluate reactivation of psoas abscess and they are repeating blood cultures and removed his PICC line; CT of the pelvis showed "No pelvic abscess or acute osseous abnormality identified. Trace free fluid in the deep pelvis, likely related to anasarca. Subtle residual of the left psoas abscess at the L5 level which was drained last month. Infected lumbar levels minimally included on this exam. See Lumbar MRI 01/05/2021." -ID is now consulted interventional radiology for the fluid collection mentioned in the deep subdural soft tissue measuring 4.7 x 5.0 x 1.7 cm -ID is now also sending susceptibility testing to daptomycin, ceftrarloibne and linezolid -Cardiology been consulted for TEE   Confusion -Neurosurgery is obtaining a MRI of the brain with and without contrast given that he does have an extensive epidural abscess the spine with encephalopathy -MRI Brain showed "Significantly motion degraded examination, as described and limiting evaluation.   Within this limitation, no definite acute intracranial  abnormality is identified. Mild generalized parenchymal atrophy and cerebral white matter chronic small vessel ischemic disease. Mild paranasal sinus disease, as described"  Unwitnessed fall -Fell sometime early this morning -Repeat head CT showed no acute intracranial abnormalities noted   Normocytic Anemia  - Hgb is 8.6, down from 11.0 on 12/29/20; repeat hemoglobin/hematocrit is now 12.4/38.0 yesterday and today it is 11.4/35.5 - No overt bleeding  -Checked anemia panel and showed iron level of 19, U IBC 139, TIBC 158, saturation ratios of 12%, ferritin level 864, folate of 2.5, vitamin B12 level 1070 -Continue to monitor for signs and symptoms of bleeding -Repeat CBC in a.m.   Type II DM  - A1c was 9.1% in June 2022  - Check CBGs and use SSI for now ; CBGs ranging from 124-156   CAD  - No anginal complaints  - Held ASA preoperatively however if he is not going to surgery we will resume, continue atorvastatin 40 mg nightly and beta-blocker with metoprolol tartrate 25 mg nightly   Hyponatremia  - Serum sodium 129 on admission and is improved to 130 yesterday and today is 134 - Appears chronic, possibly related to his SNRI   - Check urine sodium and osm   -Continue monitor and trend sodium level  Thrombocytosis -Likely reactive in the setting of infection -The patient's platelet count was 681 is now trended  down to 457 and today is 863 -Continue monitor and trend and repeat   Elevated anion metabolic acidosis -His anion gap acidosis worsened and he has a CO2 of now 14, chloride level of 100, and anion gap of 20 -We will start sodium bicarbonate drip now   GERD -Continue with pantoprazole 40 mg p.o. daily  Hyperbilirubinemia -Likely reactive -Patient's T bili and is now 1.5 -Continue to Monitor and trend and repeat CMP in the a.m.   Chronic Diastolic CHF  - Appears hypovolemic on admission  - Hold Lasix, started gentle IVF hydration, monitor volume status   -Was given 1.5  L today and will start on maintenance IV fluids with sodium bicarb and given his metabolic acidosis -Strict I's and O's and daily weights: Patient is -3.560 liters since admission   Anxiety  - Continue venlafaxine XR 25 mg daily and as-needed Xanax  DVT prophylaxis: SCDs for now and defer to specialist when to start DVT prophylaxis in case he needs procedures Code Status: FULL CODE Family Communication: Discussed with wife at bedside  Disposition Plan: Pending further clinical improvement and clearance by specialists  Status is: Inpatient  Remains inpatient appropriate because:Unsafe d/c plan, IV treatments appropriate due to intensity of illness or inability to take PO, and Inpatient level of care appropriate due to severity of illness  Dispo: The patient is from: Home              Anticipated d/c is to: Home              Patient currently is not medically stable to d/c.   Difficult to place patient No  Consultants:  Neurosurgery ID IR  Procedures:  ECHOCARDIOGRAM IMPRESSIONS     1. Left ventricular ejection fraction, by estimation, is 55 to 60%. The  left ventricle has normal function. The left ventricle has no regional  wall motion abnormalities. There is moderate left ventricular hypertrophy.  Left ventricular diastolic  parameters are indeterminate.   2. Right ventricular systolic function is normal. The right ventricular  size is normal.   3. The mitral valve is normal in structure. No evidence of mitral valve  regurgitation. No evidence of mitral stenosis.   4. The aortic valve is tricuspid. Aortic valve regurgitation is not  visualized. No aortic stenosis is present.   5. The inferior vena cava is dilated in size with >50% respiratory  variability, suggesting right atrial pressure of 8 mmHg.   FINDINGS   Left Ventricle: Left ventricular ejection fraction, by estimation, is 55  to 60%. The left ventricle has normal function. The left ventricle has no  regional  wall motion abnormalities. The left ventricular internal cavity  size was normal in size. There is   moderate left ventricular hypertrophy. Left ventricular diastolic  parameters are indeterminate.   Right Ventricle: The right ventricular size is normal. Right ventricular  systolic function is normal.   Left Atrium: Left atrial size was normal in size.   Right Atrium: Right atrial size was normal in size.   Pericardium: There is no evidence of pericardial effusion.   Mitral Valve: The mitral valve is normal in structure. No evidence of  mitral valve regurgitation. No evidence of mitral valve stenosis.   Tricuspid Valve: The tricuspid valve is normal in structure. Tricuspid  valve regurgitation is trivial. No evidence of tricuspid stenosis.   Aortic Valve: The aortic valve is tricuspid. Aortic valve regurgitation is  not visualized. No aortic stenosis is present. Aortic valve mean  gradient  measures 2.0 mmHg. Aortic valve peak gradient measures 3.1 mmHg.   Pulmonic Valve: The pulmonic valve was normal in structure. Pulmonic valve  regurgitation is not visualized. No evidence of pulmonic stenosis.   Aorta: The aortic root is normal in size and structure.   Venous: The inferior vena cava is dilated in size with greater than 50%  respiratory variability, suggesting right atrial pressure of 8 mmHg.   IAS/Shunts: No atrial level shunt detected by color flow Doppler.      LEFT VENTRICLE  PLAX 2D  LVIDd:         4.00 cm  LVIDs:         3.40 cm  LV PW:         1.60 cm  LV IVS:        1.50 cm  LVOT diam:     2.50 cm  LV SV:         79  LV SV Index:   36  LVOT Area:     4.91 cm      RIGHT VENTRICLE             IVC  RV Basal diam:  3.30 cm     IVC diam: 2.20 cm  RV S prime:     13.40 cm/s  TAPSE (M-mode): 1.3 cm   LEFT ATRIUM             Index       RIGHT ATRIUM           Index  LA diam:        3.50 cm 1.61 cm/m  RA Area:     14.90 cm  LA Vol (A2C):   69.4 ml 31.91 ml/m  RA Volume:   36.10 ml  16.60 ml/m  LA Vol (A4C):   51.3 ml 23.59 ml/m  LA Biplane Vol: 62.6 ml 28.78 ml/m   AORTIC VALVE  AV Area (Vmax):    6.50 cm  AV Area (Vmean):   5.74 cm  AV Area (VTI):     5.53 cm  AV Vmax:           87.60 cm/s  AV Vmean:          59.600 cm/s  AV VTI:            0.142 m  AV Peak Grad:      3.1 mmHg  AV Mean Grad:      2.0 mmHg  LVOT Vmax:         116.00 cm/s  LVOT Vmean:        69.700 cm/s  LVOT VTI:          0.160 m  LVOT/AV VTI ratio: 1.13     AORTA  Ao Root diam: 3.50 cm  Ao Asc diam:  3.60 cm      SHUNTS  Systemic VTI:  0.16 m  Systemic Diam: 2.50 cm   Antimicrobials: (specify start and planned stop date. Auto populated tables are space occupying and do not give end dates) Anti-infectives (From admission, onward)    Start     Dose/Rate Route Frequency Ordered Stop   01/07/21 0500  vancomycin (VANCOREADY) IVPB 1500 mg/300 mL        1,500 mg 150 mL/hr over 120 Minutes Intravenous Every 12 hours 01/06/21 1520     01/06/21 2000  DAPTOmycin (CUBICIN) 750 mg in sodium chloride 0.9 % IVPB  Status:  Discontinued  750 mg 130 mL/hr over 30 Minutes Intravenous Daily 01/06/21 0237 01/06/21 1520   01/06/21 1700  vancomycin (VANCOREADY) IVPB 2000 mg/400 mL        2,000 mg 200 mL/hr over 120 Minutes Intravenous  Once 01/06/21 1520 01/06/21 1848        Subjective: Seen and examined at bedside he was very uncomfortable getting his echocardiogram.  Still had tachycardia.  This morning he fell and it was an unwitnessed fall.  Underwent further work-up and had a TEE TEE.  No chest pain or complaints at this time but does not look well.  Objective: Vitals:   01/07/21 0829 01/07/21 0843 01/07/21 1207 01/07/21 1623  BP: 106/89 (!) 164/94 (!) 178/93 (!) 177/98  Pulse: (!) 126 (!) 116 (!) 116 (!) 108  Resp: (!) 22 (!) _0 Temp:  98.1 F (36.7 C) 97.7 F (36.5 C) 98.8 F (37.1 C)  TempSrc:  Oral Oral Oral  SpO2:  97% 97% 97%     Intake/Output Summary (Last 24 hours) at 01/07/2021 1702 Last data filed at 01/07/2021 1630 Gross per 24 hour  Intake 80.25 ml  Output 2850 ml  Net -2769.75 ml   There were no vitals filed for this visit.  Examination: Physical Exam:  Constitutional: Chronically ill-appearing Caucasian male currently in no acute distress but appears uncomfortable  Eyes: Lids and conjunctivae normal, sclerae anicteric  ENMT: External Ears, Nose appear normal. Grossly normal hearing.  Neck: Appears normal, supple, no cervical masses, normal ROM, no appreciable thyromegaly; no JVD Respiratory: Diminished to auscultation bilaterally, no wheezing, rales, rhonchi or crackles.  Patient has slightly labored breathing is mildly tachypneic but not wearing any supplemental oxygen via nasal cannula  Cardiovascular: Tachycardic rate, no murmurs / rubs / gallops. S1 and S2 auscultated.  Minimal extremity edema Abdomen: Soft, non-tender, non-distended. No masses palpated. No appreciable hepatosplenomegaly. Bowel sounds positive.  GU: Deferred. Musculoskeletal: No clubbing / cyanosis of digits/nails. No joint deformity upper and lower extremities.  Skin: No rashes, lesions, ulcers on limited skin evaluation. No induration; Warm and dry.  Neurologic: CN 2-12 grossly intact with no focal deficits. Romberg sign and cerebellar reflexes not assessed.  Psychiatric: Normal judgment and insight. Alert and oriented x 3. Normal mood and appropriate affect.   Data Reviewed: I have personally reviewed following labs and imaging studies  CBC: Recent Labs  Lab 01/05/21 1705 01/06/21 0310 01/07/21 0652  WBC 23.9* 31.7* 30.5*  NEUTROABS 20.5*  --  25.8*  HGB 8.6* 12.4* 11.4*  HCT 25.9* 38.0* 35.5*  MCV 88.1 90.0 90.3  PLT 681* 457* 023*   Basic Metabolic Panel: Recent Labs  Lab 01/05/21 1705 01/06/21 0310 01/07/21 0652  NA 129* 130* 134*  K 4.3 4.7 4.4  CL 91* 93* 100  CO2 24 19* 14*  GLUCOSE 104* 100* 138*   BUN _1 CREATININE 0.74 0.74 0.96  CALCIUM 8.0* 8.4* 8.5*  MG  --   --  1.6*  PHOS  --   --  4.0   GFR: Estimated Creatinine Clearance: 101.1 mL/min (by C-G formula based on SCr of 0.96 mg/dL). Liver Function Tests: Recent Labs  Lab 01/06/21 0310 01/07/21 0652  AST 22 21  ALT 16 16  ALKPHOS 137* 127*  BILITOT 1.4* 1.5*  PROT 6.3* 6.1*  ALBUMIN 1.6* 1.7*   No results for input(s): LIPASE, AMYLASE in the last 168 hours. No results for input(s): AMMONIA in the last 168 hours. Coagulation Profile: No results  for input(s): INR, PROTIME in the last 168 hours. Cardiac Enzymes: Recent Labs  Lab 01/06/21 0310  CKTOTAL 32*   BNP (last 3 results) No results for input(s): PROBNP in the last 8760 hours. HbA1C: No results for input(s): HGBA1C in the last 72 hours. CBG: Recent Labs  Lab 01/07/21 0053 01/07/21 0334 01/07/21 0806 01/07/21 1217 01/07/21 1625  GLUCAP 128* 141* 144* 150* 156*   Lipid Profile: No results for input(s): CHOL, HDL, LDLCALC, TRIG, CHOLHDL, LDLDIRECT in the last 72 hours. Thyroid Function Tests: No results for input(s): TSH, T4TOTAL, FREET4, T3FREE, THYROIDAB in the last 72 hours. Anemia Panel: Recent Labs    01/06/21 0310  VITAMINB12 1,070*  FOLATE 2.5*  FERRITIN 864*  TIBC 158*  IRON 19*  RETICCTPCT 1.3   Sepsis Labs: No results for input(s): PROCALCITON, LATICACIDVEN in the last 168 hours.  Recent Results (from the past 240 hour(s))  Blood culture (routine x 2)     Status: Abnormal (Preliminary result)   Collection Time: 01/05/21  5:20 PM   Specimen: BLOOD  Result Value Ref Range Status   Specimen Description BLOOD SITE NOT SPECIFIED  Final   Special Requests   Final    BOTTLES DRAWN AEROBIC AND ANAEROBIC Blood Culture adequate volume   Culture  Setup Time   Final    GRAM POSITIVE COCCI IN BOTH AEROBIC AND ANAEROBIC BOTTLES CRITICAL VALUE NOTED.  VALUE IS CONSISTENT WITH PREVIOUSLY REPORTED AND CALLED VALUE.    Culture (A)   Final    STAPHYLOCOCCUS AUREUS SUSCEPTIBILITIES TO FOLLOW Performed at [facility], [place] [address] [place], [place] [address]    Report Status PENDING  Incomplete  Blood culture (routine x 2)     Status: Abnormal (Preliminary result)   Collection Time: 01/05/21  6:10 PM   Specimen: BLOOD  Result Value Ref Range Status   Specimen Description BLOOD SITE NOT SPECIFIED  Final   Special Requests   Final    BOTTLES DRAWN AEROBIC AND ANAEROBIC Blood Culture results may not be optimal due to an inadequate volume of blood received in culture bottles   Culture  Setup Time   Final    GRAM POSITIVE COCCI IN BOTH AEROBIC AND ANAEROBIC BOTTLES CRITICAL VALUE NOTED.  VALUE IS CONSISTENT WITH PREVIOUSLY REPORTED AND CALLED VALUE. Performed at [facility], [place] [address] [place], [place] [address]    Culture STAPHYLOCOCCUS AUREUS (A)  Final   Report Status PENDING  Incomplete  Resp Panel by RT-PCR (Flu A&B, Covid) Nasopharyngeal Swab     Status: None   Collection Time: 01/05/21  9:32 PM   Specimen: Nasopharyngeal Swab; Nasopharyngeal(NP) swabs in vial transport medium  Result Value Ref Range Status   SARS Coronavirus 2 by RT PCR NEGATIVE NEGATIVE Final    Comment: (NOTE) SARS-CoV-2 target nucleic acids are NOT DETECTED.  The SARS-CoV-2 RNA is generally detectable in upper respiratory specimens during the acute phase of infection. The lowest concentration of SARS-CoV-2 viral copies this assay can detect is 138 copies/mL. A negative result does not preclude SARS-Cov-2 infection and should not be used as the sole basis for treatment or other patient management decisions. A negative result may occur with  improper specimen collection/handling, submission of specimen other than nasopharyngeal swab, presence of viral mutation(s) within the areas targeted by this assay, and inadequate number of viral copies(<138 copies/mL). A negative result must be combined with clinical observations,  patient history, and epidemiological information. The expected result is Negative.  Fact Sheet for Patients:  EntrepreneurPulse.com.au  Fact Sheet for Healthcare Providers:  IncredibleEmployment.be  This test is no t yet approved or cleared by the [place] FDA and  has been authorized for detection and/or diagnosis of SARS-CoV-2 by FDA under an Emergency Use Authorization (EUA). This EUA will remain  in effect (meaning this test can be used) for the duration of the COVID-19 declaration under Section 564(b)(1) of the Act, 21 U.S.C.section 360bbb-3(b)(1), unless the authorization is terminated  or revoked sooner.       Influenza A by PCR NEGATIVE NEGATIVE Final   Influenza B by PCR NEGATIVE NEGATIVE Final    Comment: (NOTE) The Xpert Xpress SARS-CoV-2/FLU/RSV plus assay is intended as an aid in the diagnosis of influenza from Nasopharyngeal swab specimens and should not be used as a sole basis for treatment. Nasal washings and aspirates are unacceptable for Xpert Xpress SARS-CoV-2/FLU/RSV testing.  Fact Sheet for Patients: EntrepreneurPulse.com.au  Fact Sheet for Healthcare Providers: IncredibleEmployment.be  This test is not yet approved or cleared by the [place] FDA and has been authorized for detection and/or diagnosis of SARS-CoV-2 by FDA under an Emergency Use Authorization (EUA). This EUA will remain in effect (meaning this test can be used) for the duration of the COVID-19 declaration under Section 564(b)(1) of the Act, 21 U.S.C. section 360bbb-3(b)(1), unless the authorization is terminated or revoked.  Performed at [facility], [place] [address] [place],  [address]     RN Pressure Injury Documentation:     Estimated body mass index is 30.01 kg/m as calculated from the following:   Height as of 12/21/20: _0  (1.803 m).   Weight as of 12/25/20: 97.6 kg.  Malnutrition  Type:   Malnutrition Characteristics:   Nutrition Interventions:    Radiology Studies: CT HEAD WO CONTRAST  Result Date: 01/07/2021 CLINICAL DATA:  Altered mental status EXAM: CT HEAD WITHOUT CONTRAST TECHNIQUE: Contiguous axial images were obtained from the base of the skull through the vertex without intravenous contrast. COMPARISON:  12/16/2020 FINDINGS: Brain: No acute intracranial abnormality. Specifically, no hemorrhage, hydrocephalus, mass lesion, acute infarction, or significant intracranial injury. Vascular: No hyperdense vessel or unexpected calcification. Skull: No acute calvarial abnormality. Sinuses/Orbits: No acute findings Other: None IMPRESSION: No acute intracranial abnormality. Electronically Signed   By: Rolm Baptise M.D.   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 Status post fall today. Left paraspinal abscess at L3. History of left psoas abscess at the L4 and L5 levels drained with CT guidance last month. EXAM: CT PELVIS WITH CONTRAST TECHNIQUE: Multidetector CT imaging of the pelvis was performed using the standard protocol following the bolus administration of intravenous contrast. CONTRAST:  67m OMNIPAQUE IOHEXOL 300 MG/ML  SOLN COMPARISON:  CT Abdomen and Pelvis 12/11/2020. Lumbar MRI 12/20/2020, 01/05/2021. FINDINGS: Urinary Tract: Mildly distended but otherwise unremarkable bladder. Distal ureters appear decompressed. Kidneys not included. Bowel: No dilated large or small bowel. Mild retained stool in the distal colon. Normal appendix on series 7, image 74. Vascular/Lymphatic: Suboptimal intravascular contrast such that Vascular patency is not well evaluated. Aortoiliac calcified atherosclerosis. Proximal femoral calcified plaque. No lymphadenopathy. Reproductive:  Negative. Other: Anasarca. Trace layering fluid in the deep pelvis with simple fluid density (series 2, image 58).  Musculoskeletal: Minimal inclusion of the L4-L5 level on this exam. Ventral epidural and paraspinal inflammation more pronounced at L4 was better demonstrated on the recent MRI. Subtle  evidence of the medial left psoas muscle abscess which was drained last month on series 2, image 9. No new or increased psoas muscle inflammation there. No drainable pelvic abscess identified. SI joints appear symmetric and intact. No acute osseous abnormality identified in the pelvis. Proximal femurs are intact. IMPRESSION: 1. No pelvic abscess or acute osseous abnormality identified. Trace free fluid in the deep pelvis, likely related to anasarca. Subtle residual of the left psoas abscess at the L5 level which was drained last month. 2. Infected lumbar levels minimally included on this exam. See Lumbar MRI 01/05/2021. 3. Aortic Atherosclerosis (ICD10-I70.0). Electronically Signed   By: Genevie Ann M.D.   On: 01/07/2021 12:04   MR BRAIN W WO CONTRAST  Result Date: 01/06/2021 CLINICAL DATA:  Mental status change, unknown cause. 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 EXAM: MRI HEAD WITHOUT AND WITH CONTRAST TECHNIQUE: Multiplanar, multiecho pulse sequences of the brain and surrounding structures were obtained without and with intravenous contrast. CONTRAST:  9.74m GADAVIST GADOBUTROL 1 MMOL/ML IV SOLN COMPARISON:  Head CT 12/16/2020. FINDINGS: Brain: Multiple sequences are significantly motion degraded, limiting evaluation. Most notably, there is moderate/severe motion degradation of the axial T2/FLAIR sequence, severe motion degradation of the coronal T2 weighted sequence, moderate motion degradation of the axial T1 weighted postcontrast sequence and severe motion degradation of the coronal T1 weighted postcontrast sequence. Mild generalized cerebral and cerebellar atrophy. Mild multifocal T2/FLAIR hyperintensity within the cerebral white matter, nonspecific but compatible chronic small vessel ischemic disease. There  is no evidence of acute infarct. Within described limitations, no evidence of an intracranial mass, chronic intracranial blood products or an extra-axial fluid collection. No midline shift. Within limitations of motion degradation, no pathologic intracranial enhancement is identified. Vascular: Expected proximal arterial flow voids. Skull and upper cervical spine: No focal marrow lesion. Sinuses/Orbits: Visualized orbits show no acute finding. Trace mucosal thickening within the bilateral ethmoid, sphenoid and left maxillary sinuses. IMPRESSION: Significantly motion degraded examination, as described and limiting evaluation. Within this limitation, no definite acute intracranial abnormality is identified. Mild generalized parenchymal atrophy and cerebral white matter chronic small vessel ischemic disease. Mild paranasal sinus disease, as described. Electronically Signed   By: KKellie SimmeringDO   On: 01/06/2021 19:22   MR THORACIC SPINE W WO CONTRAST  Result Date: 01/05/2021 CLINICAL DATA:  Spine surgery 2 weeks ago. Increasing pain for the last 2 days. Redness and swelling to the operative site. EXAM: MRI THORACIC AND LUMBAR SPINE WITHOUT AND WITH CONTRAST TECHNIQUE: Multiplanar and multiecho pulse sequences of the thoracic and lumbar spine were obtained without and with intravenous contrast. CONTRAST:  959mGADAVIST GADOBUTROL 1 MMOL/ML IV SOLN COMPARISON:  None. FINDINGS: MRI THORACIC SPINE FINDINGS Alignment:  No significant listhesis is present. Vertebrae: Diffuse T2 hyperintensity enhancement throughout the T7 and T8 vertebral bodies has increased. Patient is status post T7 laminectomy. Focal enhancement is again noted at the T4 vertebral body. Cord: Cord is displaced posteriorly by a epidural fluid T6-7. Cord size and signal are otherwise normal. Paraspinal and other soft tissues: Posterior epidural fluid collection is partially decompressed. It extends superiorly to the T5-6 level similar the prior exam.  There is less mass effect posteriorly than on the previous study. Collection extends inferiorly to the T9 level. There is an extensive ventral collection extending into the lumbar spine. Abnormal paraspinous soft tissue enhancement extends from T3-4 to T9-10. No drainable collection is present. There is a peripherally enhancing fluid collection the operative bed which measures 6.1 x  1.0 x 2.1 cm. Disc levels: No focal disc disease is present. Abnormal enhancement is present in the foramina on the left T4-5, T5-6, T6-7 T7-8, and T8-9. Right foraminal enhancement is less extensive predominantly involving T6-7, T7-8 T8-9. MRI LUMBAR SPINE FINDINGS Segmentation: 5 non rib-bearing lumbar type vertebral bodies are present. The lowest fully formed vertebral body is L5. Alignment:  No significant listhesis is present. Vertebrae: Progressive signal abnormality and enhancement is present in the vertebral bodies, predominantly at L3 and L4. There is abnormal disc signal without definite disc enhancement at L3-4 and L4-5. There is heterogeneous signal and enhancement posteriorly at L5-S1 which may be related to degenerative change. Conus medullaris: Extends to the L1-2 level and appears normal. Paraspinal and other soft tissues: Patient is status post left laminectomy at L3. Fluid tracks from the laminectomy site into a subcutaneous and deep soft tissue collection with peripheral enhancement measuring 4.7 x 5.0 x 1.7 cm. The ventral epidural collection extends inferiorly to the L2 level. Separate prominent ventral collection is present posterior to L4 prominently right the left. This collection measures 2.9 x 1.3 cm on the sagittal images. Disc levels: In addition to the epidural collections, disc disease is again noted at L4-5 and L5-S1. Central foraminal narrowing are associated. IMPRESSION: 1. T7 laminectomy subcutaneous peripherally enhancing fluid collection at this level as well. 2. Residual posterior epidural fluid  collection in the thoracic spine with similar cephalo caudad extension from T5-6-T9 but less mass effect. 3. Progressive signal abnormality and enhancement throughout the T7 and T8 vertebral bodies in the T7-8 thoracic disc consistent with disc osteomyelitis. 4. Progressive paraspinous soft tissue enhancement at T3-4 through T9-10 without drainable collection. 5. Persistent extensive ventral epidural collection compatible with abscess. 6. Status post left laminectomy at L3 with peripherally enhancing fluid collection extending from the operative site into the subcutaneous and deep tissues as described. This is concerning abscess. 7. Fluid tracks from the laminectomy site into a subdural deep soft tissue collection measuring 4.7 x 5.0 x 1.7 cm. 8. Separate ventral epidural collection at L4-5 and L5-S1 consistent with epidural abscess. 9. Progressive signal abnormality and enhancement in the L3 and L4 vertebral bodies. 10. 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 Electronically Signed   By: San Morelle M.D.   On: 01/05/2021 21:24   MR Lumbar Spine W Wo Contrast  Result Date: 01/05/2021 CLINICAL DATA:  Spine surgery 2 weeks ago. Increasing pain for the last 2 days. Redness and swelling to the operative site. EXAM: MRI THORACIC AND LUMBAR SPINE WITHOUT AND WITH CONTRAST TECHNIQUE: Multiplanar and multiecho pulse sequences of the thoracic and lumbar spine were obtained without and with intravenous contrast. CONTRAST:  70m GADAVIST GADOBUTROL 1 MMOL/ML IV SOLN COMPARISON:  None. FINDINGS: MRI THORACIC SPINE FINDINGS Alignment:  No significant listhesis is present. Vertebrae: Diffuse T2 hyperintensity enhancement throughout the T7 and T8 vertebral bodies has increased. Patient is status post T7 laminectomy. Focal enhancement is again noted at the T4 vertebral  body. Cord: Cord is displaced posteriorly by a epidural fluid T6-7. Cord size and signal are otherwise normal. Paraspinal and other soft tissues: Posterior epidural fluid collection is partially decompressed. It extends superiorly to the T5-6 level similar the prior exam. There is less mass effect posteriorly than on the previous study. Collection extends inferiorly to the T9 level. There is an extensive ventral collection extending into the  lumbar spine. Abnormal paraspinous soft tissue enhancement extends from T3-4 to T9-10. No drainable collection is present. There is a peripherally enhancing fluid collection the operative bed which measures 6.1 x 1.0 x 2.1 cm. Disc levels: No focal disc disease is present. Abnormal enhancement is present in the foramina on the left T4-5, T5-6, T6-7 T7-8, and T8-9. Right foraminal enhancement is less extensive predominantly involving T6-7, T7-8 T8-9. MRI LUMBAR SPINE FINDINGS Segmentation: 5 non rib-bearing lumbar type vertebral bodies are present. The lowest fully formed vertebral body is L5. Alignment:  No significant listhesis is present. Vertebrae: Progressive signal abnormality and enhancement is present in the vertebral bodies, predominantly at L3 and L4. There is abnormal disc signal without definite disc enhancement at L3-4 and L4-5. There is heterogeneous signal and enhancement posteriorly at L5-S1 which may be related to degenerative change. Conus medullaris: Extends to the L1-2 level and appears normal. Paraspinal and other soft tissues: Patient is status post left laminectomy at L3. Fluid tracks from the laminectomy site into a subcutaneous and deep soft tissue collection with peripheral enhancement measuring 4.7 x 5.0 x 1.7 cm. The ventral epidural collection extends inferiorly to the L2 level. Separate prominent ventral collection is present posterior to L4 prominently right the left. This collection measures 2.9 x 1.3 cm on the sagittal images. Disc levels: In  addition to the epidural collections, disc disease is again noted at L4-5 and L5-S1. Central foraminal narrowing are associated. IMPRESSION: 1. T7 laminectomy subcutaneous peripherally enhancing fluid collection at this level as well. 2. Residual posterior epidural fluid collection in the thoracic spine with similar cephalo caudad extension from T5-6-T9 but less mass effect. 3. Progressive signal abnormality and enhancement throughout the T7 and T8 vertebral bodies in the T7-8 thoracic disc consistent with disc osteomyelitis. 4. Progressive paraspinous soft tissue enhancement at T3-4 through T9-10 without drainable collection. 5. Persistent extensive ventral epidural collection compatible with abscess. 6. Status post left laminectomy at L3 with peripherally enhancing fluid collection extending from the operative site into the subcutaneous and deep tissues as described. This is concerning abscess. 7. Fluid tracks from the laminectomy site into a subdural deep soft tissue collection measuring 4.7 x 5.0 x 1.7 cm. 8. Separate ventral epidural collection at L4-5 and L5-S1 consistent with epidural abscess. 9. Progressive signal abnormality and enhancement in the L3 and L4 vertebral bodies. 10. 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 Electronically Signed   By: San Morelle M.D.   On: 01/05/2021 21:24   ECHOCARDIOGRAM COMPLETE  Result Date: 01/07/2021    ECHOCARDIOGRAM REPORT   Patient Name:   Louis [name]  Date of Exam: 01/07/2021 Medical Rec #:  [id number]  Height:       71.0 in Accession #:    [id number] Weight:       215.2 lb Date of Birth:  22-Jan-1966   BSA:          2.175 m Patient Age:    77 years   BP:           164/94 mmHg Patient Gender: M          HR:           116 bpm. Exam Location:  Inpatient Procedure: 2D Echo, Cardiac Doppler and Color Doppler  Indications:    Bacteremia  History:        Patient has prior history of  Echocardiogram examinations, most                 recent 12/15/2020. CAD, Prior CABG; Risk Factors:Hypertension and                 Dyslipidemia.  Sonographer:    Clayton Lefort RDCS (AE) Referring Phys: [id number] Byers  1. Left ventricular ejection fraction, by estimation, is 55 to 60%. The left ventricle has normal function. The left ventricle has no regional wall motion abnormalities. There is moderate left ventricular hypertrophy. Left ventricular diastolic parameters are indeterminate.  2. Right ventricular systolic function is normal. The right ventricular size is normal.  3. The mitral valve is normal in structure. No evidence of mitral valve regurgitation. No evidence of mitral stenosis.  4. The aortic valve is tricuspid. Aortic valve regurgitation is not visualized. No aortic stenosis is present.  5. The inferior vena cava is dilated in size with >50% respiratory variability, suggesting right atrial pressure of 8 mmHg. FINDINGS  Left Ventricle: Left ventricular ejection fraction, by estimation, is 55 to 60%. The left ventricle has normal function. The left ventricle has no regional wall motion abnormalities. The left ventricular internal cavity size was normal in size. There is  moderate left ventricular hypertrophy. Left ventricular diastolic parameters are indeterminate. Right Ventricle: The right ventricular size is normal. Right ventricular systolic function is normal. Left Atrium: Left atrial size was normal in size. Right Atrium: Right atrial size was normal in size. Pericardium: There is no evidence of pericardial effusion. Mitral Valve: The mitral valve is normal in structure. No evidence of mitral valve regurgitation. No evidence of mitral valve stenosis. Tricuspid Valve: The tricuspid valve is normal in structure. Tricuspid valve regurgitation is trivial. No evidence of tricuspid stenosis. Aortic Valve: The  aortic valve is tricuspid. Aortic valve regurgitation is not visualized. No aortic stenosis is present. Aortic valve mean gradient measures 2.0 mmHg. Aortic valve peak gradient measures 3.1 mmHg. Pulmonic Valve: The pulmonic valve was normal in structure. Pulmonic valve regurgitation is not visualized. No evidence of pulmonic stenosis. Aorta: The aortic root is normal in size and structure. Venous: The inferior vena cava is dilated in size with greater than 50% respiratory variability, suggesting right atrial pressure of 8 mmHg. IAS/Shunts: No atrial level shunt detected by color flow Doppler.  LEFT VENTRICLE PLAX 2D LVIDd:         4.00 cm LVIDs:         3.40 cm LV PW:         1.60 cm LV IVS:        1.50 cm LVOT diam:     2.50 cm LV SV:         79 LV SV Index:   36 LVOT Area:     4.91 cm  RIGHT VENTRICLE             IVC RV Basal diam:  3.30 cm     IVC diam: 2.20 cm RV S prime:     13.40 cm/s TAPSE (M-mode): 1.3 cm LEFT ATRIUM             Index       RIGHT ATRIUM           Index LA diam:        3.50 cm 1.61 cm/m  RA Area:     14.90 cm LA Vol (A2C):   69.4 ml 31.91 ml/m RA Volume:   36.10 ml  16.60 ml/m LA  Vol (A4C):   51.3 ml 23.59 ml/m LA Biplane Vol: 62.6 ml 28.78 ml/m  AORTIC VALVE AV Area (Vmax):    6.50 cm AV Area (Vmean):   5.74 cm AV Area (VTI):     5.53 cm AV Vmax:           87.60 cm/s AV Vmean:          59.600 cm/s AV VTI:            0.142 m AV Peak Grad:      3.1 mmHg AV Mean Grad:      2.0 mmHg LVOT Vmax:         116.00 cm/s LVOT Vmean:        69.700 cm/s LVOT VTI:          0.160 m LVOT/AV VTI ratio: 1.13  AORTA Ao Root diam: 3.50 cm Ao Asc diam:  3.60 cm  SHUNTS Systemic VTI:  0.16 m Systemic Diam: 2.50 cm Kirk Ruths MD Electronically signed by Kirk Ruths MD Signature Date/Time: 01/07/2021/12:45:33 PM    Final     Scheduled Meds:  atorvastatin  40 mg Oral QHS   gabapentin  200 mg Oral TID   insulin aspart  0-9 Units Subcutaneous Q4H   lactulose  10 g Oral Daily   metoprolol  tartrate  25 mg Oral QHS   oxyCODONE  20 mg Oral Q12H   pantoprazole  40 mg Oral Daily   polyethylene glycol  17 g Oral BID   venlafaxine XR  75 mg Oral q morning   Continuous Infusions:  sodium bicarbonate 150 mEq in D5W infusion 75 mL/hr at 01/07/21 1144   sodium chloride     sodium chloride     vancomycin 1,500 mg (01/07/21 0527)     LOS: 1 day   Kerney Elbe, DO [facility] PAGER is on AMION  If 7PM-7AM, please contact night-coverage [email or web address]

## 2021-01-07 NOTE — Progress Notes (Signed)
  Echocardiogram 2D Echocardiogram has been performed.  Louis [name] 01/07/2021, 11:05 AM

## 2021-01-07 NOTE — Progress Notes (Signed)
[facility]  Date of Admission:  01/05/2021      Total days of antibiotics   6/19 >> current admission has been on IV vancomycin --> daptomycin since 6/29          ASSESSMENT: Louis [name] is a 55 y.o. 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   He had been doing relatively well clinically up until Tuesday of this week (though started having tachycardia and problems warranting ER visit on 7/06 the week prior in the chart). Has had persistent high leukocytosis and worsening thrombocytosis since discharge, concerning for unresolved source of infection.  Unfortunately repeat blood cultures from 7/14 have quickly started growing staph aureus again, certainly MRSA as he has previously grown. His PICC has been removed and only PIVs currently. Follow up blood cultures ordered. He did not have TEE last hospital stay, however we are still worried about his back being an ongoing nidus for infection.   MRI indicates worsening/advancing osteomyelitis - Dr. Jake Samples following and concerned given this complex case that further surgery may be more risky for him. MRI does mention a subdural deep soft tissue collection measuring 4.7 x 5.0 x 1.7 cm >> would discuss with IR to see if this is amenable to percutaneous drainage?  Would start with TTE to see if any changes.   Given recurrent blood stream infection in this setting would not suspect new pathogen.  We do have concern that there may be a component to daptomycin resistance with this MRSA isolate; while uncommon will change to Vancomycin for different agent and send blood for susceptibility testing against daptomycin, ceftaroline and linezolid.   CT of the pelvis reassuring with only subtle residual left psoas muscle abscess @ L5, previously drained.   Encephalopathy - multifactorial in the setting of  relapsing bacteremia/sepsis, pain. MRI w/ and w/o of brain limited with motion but only mild generalized chronic small vessel ischemic disease.  No concern for cardioembolic source per MRI.   Falls - 2x recently, one overnight unwitnessed fall. Head CT without any remarkable finding.     PLAN: IR evaluation of fluid collection mentioned on MRI for possible drainage  Stop daptomycin  Start vancomycin Send blood cultures for linezolid, dapto, ceftaroline susceptibility testing  TTE    Principal Problem:   MRSA bacteremia Active Problems:   CAD (coronary artery disease)   HTN (hypertension)   Uncontrolled diabetes mellitus (HCC)   Epidural abscess   Normocytic anemia   Hyponatremia   Anxiety   Chronic diastolic CHF (congestive heart failure) (HCC)    atorvastatin  40 mg Oral QHS   gabapentin  200 mg Oral TID   insulin aspart  0-9 Units Subcutaneous Q4H   lactulose  10 g Oral Daily   metoprolol tartrate  25 mg Oral QHS   oxyCODONE  20 mg Oral Q12H   pantoprazole  40 mg Oral Daily   polyethylene glycol  17 g Oral BID   venlafaxine XR  75 mg Oral q morning    SUBJECTIVE: Patient is encephalopathic and not able to contribute.   Wife is at the bedside and very concerned about his condition. Asking about possible transfer to [facility] or other hospital if that would be helpful.  Reported that he was doing pretty well up until Tuesday of this week when he was unable to get up and participate  due to severe worsening pain in the back and leg. He has been having intermittent confusion since that time. He was taken to ER on 7/06 for tachycardia and shortness of breath. Found to have low Na and elevated wbc at that time. Given IVF for tachycardia and discharged home.   No drainage from the back incision. Has been doing well on IV therapy with PICC line and getting in all daptomycin doses from recent hospitalization 6/19.    Review of Systems: Review of Systems  Unable to perform ROS:  Mental status change   No Known Allergies  OBJECTIVE: Vitals:   01/07/21 0807 01/07/21 0829 01/07/21 0843 01/07/21 1207  BP: (!) 153/90 106/89 (!) 164/94 (!) 178/93  Pulse: (!) 121 (!) 126 (!) 116 (!) 116  Resp: 19 (!) 22 (!) 21 17  Temp: 98.6 F (37 C)  98.1 F (36.7 C) 97.7 F (36.5 C)  TempSrc:   Oral Oral  SpO2: 100%  97% 97%   There is no height or weight on file to calculate BMI.  Physical Exam Vitals and nursing note reviewed.  Constitutional:      Appearance: He is ill-appearing.     Comments: Resting in bed. Not responding to me currently.   Cardiovascular:     Rate and Rhythm: Regular rhythm. Tachycardia present.     Pulses: Normal pulses.     Heart sounds: No murmur heard. Pulmonary:     Effort: Pulmonary effort is normal.     Breath sounds: Normal breath sounds.  Abdominal:     General: Bowel sounds are normal. There is no distension.  Musculoskeletal:        General: Normal range of motion.  Skin:    General: Skin is warm and dry.     Capillary Refill: Capillary refill takes less than 2 seconds.     Findings: No rash.  Neurological:     Mental Status: He is disoriented.    Lab Results Lab Results  Component Value Date   WBC 30.5 (H) 01/07/2021   HGB 11.4 (L) 01/07/2021   HCT 35.5 (L) 01/07/2021   MCV 90.3 01/07/2021   PLT 863 (H) 01/07/2021    Lab Results  Component Value Date   CREATININE 0.96 01/07/2021   BUN 9 01/07/2021   NA 134 (L) 01/07/2021   K 4.4 01/07/2021   CL 100 01/07/2021   CO2 14 (L) 01/07/2021    Lab Results  Component Value Date   ALT 16 01/07/2021   AST 21 01/07/2021   ALKPHOS 127 (H) 01/07/2021   BILITOT 1.5 (H) 01/07/2021     Microbiology: Recent Results (from the past 240 hour(s))  Blood culture (routine x 2)     Status: Abnormal (Preliminary result)   Collection Time: 01/05/21  5:20 PM   Specimen: BLOOD  Result Value Ref Range Status   Specimen Description BLOOD SITE NOT SPECIFIED  Final   Special Requests    Final    BOTTLES DRAWN AEROBIC AND ANAEROBIC Blood Culture adequate volume   Culture  Setup Time   Final    GRAM POSITIVE COCCI IN BOTH AEROBIC AND ANAEROBIC BOTTLES CRITICAL VALUE NOTED.  VALUE IS CONSISTENT WITH PREVIOUSLY REPORTED AND CALLED VALUE.    Culture (A)  Final    STAPHYLOCOCCUS AUREUS SUSCEPTIBILITIES TO FOLLOW Performed at [facility], [address], [place], [place] [address]    Report Status PENDING  Incomplete  Blood culture (routine x 2)     Status:  Abnormal (Preliminary result)   Collection Time: 01/05/21  6:10 PM   Specimen: BLOOD  Result Value Ref Range Status   Specimen Description BLOOD SITE NOT SPECIFIED  Final   Special Requests   Final    BOTTLES DRAWN AEROBIC AND ANAEROBIC Blood Culture results may not be optimal due to an inadequate volume of blood received in culture bottles   Culture  Setup Time   Final    GRAM POSITIVE COCCI IN BOTH AEROBIC AND ANAEROBIC BOTTLES CRITICAL VALUE NOTED.  VALUE IS CONSISTENT WITH PREVIOUSLY REPORTED AND CALLED VALUE. Performed at [facility], [address], [place], [place] [address]    Culture STAPHYLOCOCCUS AUREUS (A)  Final   Report Status PENDING  Incomplete  Resp Panel by RT-PCR (Flu A&B, Covid) Nasopharyngeal Swab     Status: None   Collection Time: 01/05/21  9:32 PM   Specimen: Nasopharyngeal Swab; Nasopharyngeal(NP) swabs in vial transport medium  Result Value Ref Range Status   SARS Coronavirus 2 by RT PCR NEGATIVE NEGATIVE Final    Comment: (NOTE) SARS-CoV-2 target nucleic acids are NOT DETECTED.  The SARS-CoV-2 RNA is generally detectable in upper respiratory specimens during the acute phase of infection. The lowest concentration of SARS-CoV-2 viral copies this assay can detect is 138 copies/mL. A negative result does not preclude SARS-Cov-2 infection and should not be used as the sole basis for treatment or other patient management decisions. A negative result may occur with  improper  specimen collection/handling, submission of specimen other than nasopharyngeal swab, presence of viral mutation(s) within the areas targeted by this assay, and inadequate number of viral copies(<138 copies/mL). A negative result must be combined with clinical observations, patient history, and epidemiological information. The expected result is Negative.  Fact Sheet for Patients:  BloggerCourse.com  Fact Sheet for Healthcare Providers:  SeriousBroker.it  This test is no t yet approved or cleared by the [place] FDA and  has been authorized for detection and/or diagnosis of SARS-CoV-2 by FDA under an Emergency Use Authorization (EUA). This EUA will remain  in effect (meaning this test can be used) for the duration of the COVID-19 declaration under Section 564(b)(1) of the Act, 21 U.S.C.section 360bbb-3(b)(1), unless the authorization is terminated  or revoked sooner.       Influenza A by PCR NEGATIVE NEGATIVE Final   Influenza B by PCR NEGATIVE NEGATIVE Final    Comment: (NOTE) The Xpert Xpress SARS-CoV-2/FLU/RSV plus assay is intended as an aid in the diagnosis of influenza from Nasopharyngeal swab specimens and should not be used as a sole basis for treatment. Nasal washings and aspirates are unacceptable for Xpert Xpress SARS-CoV-2/FLU/RSV testing.  Fact Sheet for Patients: BloggerCourse.com  Fact Sheet for Healthcare Providers: SeriousBroker.it  This test is not yet approved or cleared by the [place] FDA and has been authorized for detection and/or diagnosis of SARS-CoV-2 by FDA under an Emergency Use Authorization (EUA). This EUA will remain in effect (meaning this test can be used) for the duration of the COVID-19 declaration under Section 564(b)(1) of the Act, 21 U.S.C. section 360bbb-3(b)(1), unless the authorization is terminated or revoked.  Performed at  [facility], [address], [place], [place] [address]     Rexene Alberts, MSN, NP-C [facility]  [email or web address] Pager: [phone] Office: [phone] [facility] Main Line: [phone]

## 2021-01-07 NOTE — Progress Notes (Signed)
Chief Complaint: Patient was seen in consultation today for paraspinous abscess   Referring Physician(s): Rexene Alberts, NP  Supervising Physician: Oley Balm  Patient Status: [facility] - In-pt  History of Present Illness: Louis [name] is a 55 y.o. 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  He is status post T7-8, T12-L1, L3-L4 laminectomy for epidural abscess on 12/21/2020.  Readmitted now with recurrent staph aureus bacteremia.  MRI indicated worsening/advancing osteomyelitis.  Evaluated by neurosurgery who had concern that further surgery may be more risky for him. MRI does mention a soft tissue collection measuring 5 cm. ID consulted and requests IR perform image guided aspiration/drainage. PMHx, meds, labs, imaging, allergies reviewed. Wife at bedside.   Past Medical History:  Diagnosis Date   CAD (coronary artery disease)    DM (diabetes mellitus) (HCC)    Hyperlipidemia    Hypertension     Past Surgical History:  Procedure Laterality Date   LUMBAR LAMINECTOMY FOR EPIDURAL ABSCESS N/A 12/21/2020   Procedure: Thoracic seven-eight, Thoracic twelve-Lumbar one, Lumbar three-four laminectomy for Epidural Abscess;  Surgeon: Dawley, Alan Mulder, DO;  Location: [facility] OR;  Service: Neurosurgery;  Laterality: N/A;    Allergies: Patient has no known allergies.  Medications:  Current Facility-Administered Medications:    acetaminophen (TYLENOL) tablet 650 mg, 650 mg, Oral, Q6H PRN **OR** acetaminophen (TYLENOL) suppository 650 mg, 650 mg, Rectal, Q6H PRN, Opyd, Lavone Neri, MD   ALPRAZolam Prudy Feeler) tablet 0.25 mg, 0.25 mg, Oral, BID PRN, Opyd, Lavone Neri, MD, 0.25 mg at 01/07/21 0521   atorvastatin (LIPITOR) tablet 40 mg, 40 mg, Oral, QHS, Opyd, Timothy S, MD, 40 mg at 01/06/21 2229   diclofenac Sodium (VOLTAREN) 1 % topical gel 1 application, 1 application, Topical, QID PRN, Opyd, Lavone Neri, MD   gabapentin (NEURONTIN) capsule 200 mg,  200 mg, Oral, TID, Opyd, Lavone Neri, MD, 200 mg at 01/07/21 1151   HYDROmorphone (DILAUDID) injection 1 mg, 1 mg, Intravenous, Q3H PRN, Opyd, Lavone Neri, MD, 1 mg at 01/07/21 0522   insulin aspart (novoLOG) injection 0-9 Units, 0-9 Units, Subcutaneous, Q4H, Opyd, Lavone Neri, MD, 1 Units at 01/07/21 1215   labetalol (NORMODYNE) injection 10 mg, 10 mg, Intravenous, Q2H PRN, Sheikh, Omair Latif, DO, 10 mg at 01/07/21 1201   lactulose (CHRONULAC) 10 GM/15ML solution 10 g, 10 g, Oral, Daily, Opyd, Timothy S, MD, 10 g at 01/07/21 1151   lidocaine (LIDODERM) 5 % 1 patch, 1 patch, Transdermal, Daily PRN, Opyd, Lavone Neri, MD, 1 patch at 01/06/21 0327   metoprolol tartrate (LOPRESSOR) tablet 25 mg, 25 mg, Oral, QHS, Opyd, Timothy S, MD, 25 mg at 01/06/21 2230   ondansetron (ZOFRAN) tablet 4 mg, 4 mg, Oral, Q6H PRN **OR** ondansetron (ZOFRAN) injection 4 mg, 4 mg, Intravenous, Q6H PRN, Opyd, Lavone Neri, MD   oxyCODONE (OXYCONTIN) 12 hr tablet 20 mg, 20 mg, Oral, Q12H, Opyd, Timothy S, MD, 20 mg at 01/07/21 1151   pantoprazole (PROTONIX) EC tablet 40 mg, 40 mg, Oral, Daily, Opyd, Timothy S, MD, 40 mg at 01/07/21 1151   polyethylene glycol (MIRALAX / GLYCOLAX) packet 17 g, 17 g, Oral, BID, Opyd, Timothy S, MD, 17 g at 01/07/21 1151   sodium bicarbonate 150 mEq in dextrose 5 % 1,150 mL infusion, , Intravenous, Continuous, Sheikh, Omair Latif, DO, Last Rate: 75 mL/hr at 01/07/21 1144, New Bag at 01/07/21 1144   sodium chloride 0.9 % bolus 1,000 mL, 1,000 mL, Intravenous, Once, [employer], DO  sodium chloride 0.9 % bolus 500 mL, 500 mL, Intravenous, Once, Sheikh, Omair Latif, DO   vancomycin (VANCOREADY) IVPB 1500 mg/300 mL, 1,500 mg, Intravenous, Q12H, Kathlynn Grate, DO, Last Rate: 150 mL/hr at 01/07/21 0527, 1,500 mg at 01/07/21 0527   venlafaxine XR (EFFEXOR-XR) 24 hr capsule 75 mg, 75 mg, Oral, q morning, Opyd, Lavone Neri, MD, 75 mg at 01/07/21 1211    History reviewed. No pertinent family  history.  Social History   Socioeconomic History   Marital status: Married    Spouse name: Bow Buntyn   Number of children: 1   Years of education: Not on file   Highest education level: Not on file  Occupational History   Occupation: Postal delivery  Tobacco Use   Smoking status: Every Day    Packs/day: 2.00    Types: Cigarettes    Start date: 06/28/1995   Smokeless tobacco: Never  Vaping Use   Vaping Use: Never used  Substance and Sexual Activity   Alcohol use: Not Currently   Drug use: Never   Sexual activity: Not Currently  Other Topics Concern   Not on file  Social History Narrative   Not on file   Social Determinants of Health   Financial Resource Strain: Not on file  Food Insecurity: Not on file  Transportation Needs: Not on file  Physical Activity: Not on file  Stress: Not on file  Social Connections: Not on file    Review of Systems: A 12 point ROS discussed and pertinent positives are indicated in the HPI above.  All other systems are negative.  Review of Systems  Vital Signs: BP (!) 177/98 (BP Location: Left Arm)   Pulse (!) 108   Temp 98.8 F (37.1 C) (Oral)   Resp 19   SpO2 97%   Physical Exam Cardiovascular:     Rate and Rhythm: Normal rate and regular rhythm.     Heart sounds: Normal heart sounds.  Pulmonary:     Effort: Pulmonary effort is normal. No respiratory distress.     Breath sounds: Normal breath sounds.  Neurological:     Mental Status: He is disoriented.      Imaging: DG Chest 1 View  Result Date: 12/12/2020 CLINICAL DATA:  Sepsis.  Patient reports back and leg pain. EXAM: CHEST  1 VIEW COMPARISON:  Lung bases from abdominal CT yesterday. FINDINGS: Post median sternotomy and CABG. Normal heart size for technique. No focal airspace disease. No pleural effusion, pneumothorax, or pulmonary edema. No acute osseous abnormalities are seen. IMPRESSION: No acute abnormality. Electronically Signed   By: Narda Rutherford M.D.   On:  12/12/2020 23:03   DG THORACOLUMABAR SPINE  Result Date: 12/21/2020 CLINICAL DATA:  Intraoperative localization for thoracic and lumbar laminectomy EXAM: THORACOLUMBAR SPINE - 2 VIEW; DG C-ARM 1-60 MIN COMPARISON:  None. FLUOROSCOPY TIME:  Fluoroscopy Time:  26 seconds Radiation Exposure Index (if provided by the fluoroscopic device): 14.21 mGy Number of Acquired Spot Images: 3 FINDINGS: Three spot films were obtained intraoperatively. Initial spot film shows a needle just below the L3-4 interspace in the posterior soft tissues. Subsequent images demonstrate needle localization in the posterior soft tissues at the T7-T8 level and just below the T10-T11 level. Third image shows inferior localization at T12. IMPRESSION: Intraoperative localization in the thoracic and lumbar spine as described Electronically Signed   By: Alcide Clever M.D.   On: 12/21/2020 16:15   CT HEAD WO CONTRAST  Result Date: 01/07/2021 CLINICAL DATA:  Altered  mental status EXAM: CT HEAD WITHOUT CONTRAST TECHNIQUE: Contiguous axial images were obtained from the base of the skull through the vertex without intravenous contrast. COMPARISON:  12/16/2020 FINDINGS: Brain: No acute intracranial abnormality. Specifically, no hemorrhage, hydrocephalus, mass lesion, acute infarction, or significant intracranial injury. Vascular: No hyperdense vessel or unexpected calcification. Skull: No acute calvarial abnormality. Sinuses/Orbits: No acute findings Other: None IMPRESSION: No acute intracranial abnormality. Electronically Signed   By: Charlett Nose M.D.   On: 01/07/2021 10:03   CT HEAD WO CONTRAST  Result Date: 12/16/2020 CLINICAL DATA:  Altered mental status EXAM: CT HEAD WITHOUT CONTRAST TECHNIQUE: Contiguous axial images were obtained from the base of the skull through the vertex without intravenous contrast. COMPARISON:  None. FINDINGS: Brain: No evidence of acute infarction, hemorrhage, hydrocephalus, extra-axial collection or mass  lesion/mass effect. Vascular: No hyperdense vessel or unexpected calcification. Skull: Normal. Negative for fracture or focal lesion. Sinuses/Orbits: Paranasal sinuses demonstrate mild mucosal thickening. Orbits and their contents are within normal limits. Other: None. IMPRESSION: Mild mucosal thickening in the paranasal sinuses. No acute abnormality noted. Electronically Signed   By: Alcide Clever M.D.   On: 12/16/2020 20:43   CT Angio Chest PE W and/or Wo Contrast  Result Date: 12/29/2020 CLINICAL DATA:  PE suspected, high prob Recent back surgery, discharged 3 days ago. Electronic records lists lumbar laminectomy for epidural abscess on 12/21/2020. EXAM: CT ANGIOGRAPHY CHEST WITH CONTRAST TECHNIQUE: Multidetector CT imaging of the chest was performed using the standard protocol during bolus administration of intravenous contrast. Multiplanar CT image reconstructions and MIPs were obtained to evaluate the vascular anatomy. CONTRAST:  40mL OMNIPAQUE IOHEXOL 350 MG/ML SOLN COMPARISON:  Radiograph earlier today. Chest CT 01/05/2014. Thoracic MRI 12/21/2020 FINDINGS: Cardiovascular: There are no filling defects within the pulmonary arteries to suggest pulmonary embolus. The subsegmental branches are not well assessed due to contrast bolus timing, particularly in the lower lobes where there is also breathing motion artifact. Right upper extremity PICC tip in the SVC. Mild aortic atherosclerosis. Post median sternotomy. Heart is normal in size. No pericardial effusion. Mediastinum/Nodes: Prominent mediastinal lymph nodes measuring 10-11 mm in the anterior paratracheal and prevascular station. No hilar adenopathy. Small bilateral axillary nodes. No esophageal wall thickening. No thyroid nodule. Lungs/Pleura: Small to moderate left and small right pleural effusion. There is adjacent compressive atelectasis. 4 mm right middle lobe pulmonary nodule, series 6, image 80. This is slightly larger than on prior exam. 4 mm  subpleural nodule in the left lower lobe, series 6, image 72, new from prior. There additional small perifissural and subpleural nodules. Mild heterogeneous pulmonary parenchyma. No pneumothorax. Upper Abdomen: No acute findings. Musculoskeletal: Sclerosis at T7-T8 vertebral bodies with interval T7 laminectomy. Small amount of gas in the soft tissues and operative bed, as well as the spinal canal at this level. There is anterior paravertebral soft tissue thickening/phlegmon. Left T12 laminectomy with small amount of gas in the operative bed. Associated paravertebral soft tissue thickening is only partially included in the field of view upper abdominal flank subcutaneous edema. Review of the MIP images confirms the above findings. IMPRESSION: 1. No pulmonary embolus. 2. Small to moderate left and small right pleural effusions with adjacent compressive atelectasis. 3. Recent T7-T8 discitis osteomyelitis with epidural abscess and surgical debridement. Interval T7 laminectomy. Small amount of gas in the soft tissues and operative bed, as well as the spinal canal at this level. There is anterior paravertebral soft tissue thickening/phlegmon, also seen on prior MRI. If there is clinical concern  for persistent or recurrent spinal infection, MRI is recommended. 4. Mild heterogeneous pulmonary parenchyma, likely small airways disease. 5. Small pulmonary nodules, largest measuring 4 mm in the right middle lobe, slightly larger than on prior 2015 exam. In a high-risk patient, recommend follow-up in 1 year. No further follow-up is needed in a low risk patient. 6. Prominent mediastinal lymph nodes are likely reactive. Aortic Atherosclerosis (ICD10-I70.0). Electronically Signed   By: Narda Rutherford M.D.   On: 12/29/2020 22:49   CT PELVIS W CONTRAST  Result Date: 01/07/2021 CLINICAL DATA:  55 year old male with recurrent MRSA bacteremia. Status post fall today. Left paraspinal abscess at L3. History of left psoas abscess at  the L4 and L5 levels drained with CT guidance last month. EXAM: CT PELVIS WITH CONTRAST TECHNIQUE: Multidetector CT imaging of the pelvis was performed using the standard protocol following the bolus administration of intravenous contrast. CONTRAST:  31mL OMNIPAQUE IOHEXOL 300 MG/ML  SOLN COMPARISON:  CT Abdomen and Pelvis 12/11/2020. Lumbar MRI 12/20/2020, 01/05/2021. FINDINGS: Urinary Tract: Mildly distended but otherwise unremarkable bladder. Distal ureters appear decompressed. Kidneys not included. Bowel: No dilated large or small bowel. Mild retained stool in the distal colon. Normal appendix on series 7, image 74. Vascular/Lymphatic: Suboptimal intravascular contrast such that Vascular patency is not well evaluated. Aortoiliac calcified atherosclerosis. Proximal femoral calcified plaque. No lymphadenopathy. Reproductive:  Negative. Other: Anasarca. Trace layering fluid in the deep pelvis with simple fluid density (series 2, image 58). Musculoskeletal: Minimal inclusion of the L4-L5 level on this exam. Ventral epidural and paraspinal inflammation more pronounced at L4 was better demonstrated on the recent MRI. Subtle evidence of the medial left psoas muscle abscess which was drained last month on series 2, image 9. No new or increased psoas muscle inflammation there. No drainable pelvic abscess identified. SI joints appear symmetric and intact. No acute osseous abnormality identified in the pelvis. Proximal femurs are intact. IMPRESSION: 1. No pelvic abscess or acute osseous abnormality identified. Trace free fluid in the deep pelvis, likely related to anasarca. Subtle residual of the left psoas abscess at the L5 level which was drained last month. 2. Infected lumbar levels minimally included on this exam. See Lumbar MRI 01/05/2021. 3. Aortic Atherosclerosis (ICD10-I70.0). Electronically Signed   By: Odessa Fleming M.D.   On: 01/07/2021 12:04   MR BRAIN W WO CONTRAST  Result Date: 01/06/2021 CLINICAL DATA:  Mental  status change, unknown cause. 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 EXAM: MRI HEAD WITHOUT AND WITH CONTRAST TECHNIQUE: Multiplanar, multiecho pulse sequences of the brain and surrounding structures were obtained without and with intravenous contrast. CONTRAST:  9.59mL GADAVIST GADOBUTROL 1 MMOL/ML IV SOLN COMPARISON:  Head CT 12/16/2020. FINDINGS: Brain: Multiple sequences are significantly motion degraded, limiting evaluation. Most notably, there is moderate/severe motion degradation of the axial T2/FLAIR sequence, severe motion degradation of the coronal T2 weighted sequence, moderate motion degradation of the axial T1 weighted postcontrast sequence and severe motion degradation of the coronal T1 weighted postcontrast sequence. Mild generalized cerebral and cerebellar atrophy. Mild multifocal T2/FLAIR hyperintensity within the cerebral white matter, nonspecific but compatible chronic small vessel ischemic disease. There is no evidence of acute infarct. Within described limitations, no evidence of an intracranial mass, chronic intracranial blood products or an extra-axial fluid collection. No midline shift. Within limitations of motion degradation, no pathologic intracranial enhancement is identified. Vascular: Expected proximal arterial flow voids. Skull and upper cervical spine: No focal marrow lesion. Sinuses/Orbits: Visualized orbits show no acute finding. Trace mucosal thickening  within the bilateral ethmoid, sphenoid and left maxillary sinuses. IMPRESSION: Significantly motion degraded examination, as described and limiting evaluation. Within this limitation, no definite acute intracranial abnormality is identified. Mild generalized parenchymal atrophy and cerebral white matter chronic small vessel ischemic disease. Mild paranasal sinus disease, as described. Electronically Signed   By: Jackey LogeKyle  Golden DO   On: 01/06/2021 19:22   MR LUMBAR SPINE WO CONTRAST  Result Date:  12/12/2020 CLINICAL DATA:  Severe left-sided low back pain. EXAM: MRI LUMBAR SPINE WITHOUT CONTRAST TECHNIQUE: Multiplanar, multisequence MR imaging of the lumbar spine was performed. No intravenous contrast was administered. COMPARISON:  CT urogram 12/11/2020 FINDINGS: Segmentation: 5 non rib-bearing lumbar type vertebral bodies are present. The lowest fully formed vertebral body is L5. Alignment: No significant listhesis is present. There is some straightening of the normal cervical lordosis. Vertebrae: Edematous endplate changes are present along the left side of the endplates at L3-4 and L4-5. Diffuse edematous changes are present at L5-S1. Marrow signal and vertebral body heights are otherwise normal. Conus medullaris and cauda equina: Conus extends to the L1-2 level. Conus and cauda equina appear normal. Paraspinal and other soft tissues: Edematous changes are present in the left paraspinous soft tissues, centered at L3-4 L4-5. There are changes into the left psoas muscle without a discrete abscess. Disc levels: L1-2: Negative. L2-3: Broad-based disc protrusion is asymmetric to the right. No significant stenosis. L3-4: Abnormal fluid signal is present in the disc space. Broad-based disc protrusion is present. Moderate left and mild right foraminal narrowing is present. L4-5: A broad-based disc protrusion is present. Mild facet hypertrophy is noted bilaterally. Crowding of nerve roots is present. Abnormal fluid signal is present in the disc space, worse on the left. Moderate foraminal narrowing is present bilaterally. L5-S1: Broad-based disc protrusion is asymmetric to the right. Moderate foraminal narrowing is worse on the right. Fluid signal is present in the disc space. IMPRESSION: 1. Abnormal fluid signal in the disc spaces at L3-4, L4-5, and L5-S1 concerning for early disc osteomyelitis. 2. Edematous changes in the left paraspinous soft tissues and left psoas muscle compatible with acute myositis. No  discrete abscess is present. 3. Moderate left and mild right foraminal stenosis at L3-4. 4. Moderate foraminal narrowing bilaterally at L4-5 and L5-S1. These results were called by telephone at the time of interpretation on 12/12/2020 at 8:34 pm to provider [facility] , who verbally acknowledged these results. Electronically Signed   By: Marin Robertshristopher  Mattern M.D.   On: 12/12/2020 20:36   MR CERVICAL SPINE W WO CONTRAST  Result Date: 12/21/2020 CLINICAL DATA:  55 year old male with thoracic and lumbar discitis osteomyelitis, epidural abscess. EXAM: MRI CERVICAL SPINE WITHOUT AND WITH CONTRAST TECHNIQUE: Multiplanar and multiecho pulse sequences of the cervical spine, to include the craniocervical junction and cervicothoracic junction, were obtained without and with intravenous contrast. CONTRAST:  10mL GADAVIST GADOBUTROL 1 MMOL/ML IV SOLN in conjunction with contrast enhanced imaging of the thoracic spine reported separately. COMPARISON:  Thoracic MRI from today reported separately. FINDINGS: Alignment: Straightening of cervical lordosis. No spondylolisthesis. Vertebrae: No marrow edema or evidence of acute osseous abnormality. Bone marrow signal is within normal limits at the skull base and in the cervical spine. Cord: No cervical cord signal abnormality despite degenerative mass effect on the spinal cord detailed below. But there does appear to be faint abnormal posterior dural thickening and enhancement and leptomeningeal enhancement along the upper thoracic cord as seen on series 35, image 9. Capacious upper thoracic spinal canal.  Posterior Fossa, vertebral arteries, paraspinal tissues: Cervicomedullary junction is within normal limits. Negative visible posterior fossa. Motion artifact on the axial cervical spine imaging. Postoperative or posttraumatic changes to the midline soft tissues at the level of the upper cervical spine. Elsewhere grossly normal visible neck soft tissues. Negative lung apices. Disc  levels: Suboptimal due to motion artifact. C2-C3:  Negative. C3-C4: Central disc protrusion best seen on series 20, image 11. Mild spinal stenosis and mild cord mass effect. C4-C5: More broad-based central or left paracentral disc with mild spinal stenosis and mild cord mass effect (series 19, image 17). Moderate appearing bilateral C5 foraminal stenosis. C5-C6: Broad-based posterior disc with mild spinal stenosis and cord mass effect. Moderate left and up to severe right C6 foraminal stenosis. C6-C7: Disc bulging but no spinal stenosis at this level. Mild to moderate bilateral C7 foraminal stenosis. C7-T1:  Mild facet hypertrophy but otherwise negative. IMPRESSION: 1. Partially degraded by motion. No acute or inflammatory process identified in the cervical spine. 2. Faint abnormal dural thickening and leptomeningeal enhancement along the upper thoracic spinal cord, in conjunction with the abnormal Thoracic Spine MRI reported separately today. 3. Cervical spine degeneration with mild spinal stenosis secondary to cervical disc disease C3-C4 through C5-C6. Mild cervical cord mass effect but identified no. Cord signal abnormality Electronically Signed   By: Odessa Fleming M.D.   On: 12/21/2020 08:20   MR THORACIC SPINE W WO CONTRAST  Result Date: 01/05/2021 CLINICAL DATA:  Spine surgery 2 weeks ago. Increasing pain for the last 2 days. Redness and swelling to the operative site. EXAM: MRI THORACIC AND LUMBAR SPINE WITHOUT AND WITH CONTRAST TECHNIQUE: Multiplanar and multiecho pulse sequences of the thoracic and lumbar spine were obtained without and with intravenous contrast. CONTRAST:  9mL GADAVIST GADOBUTROL 1 MMOL/ML IV SOLN COMPARISON:  None. FINDINGS: MRI THORACIC SPINE FINDINGS Alignment:  No significant listhesis is present. Vertebrae: Diffuse T2 hyperintensity enhancement throughout the T7 and T8 vertebral bodies has increased. Patient is status post T7 laminectomy. Focal enhancement is again noted at the T4  vertebral body. Cord: Cord is displaced posteriorly by a epidural fluid T6-7. Cord size and signal are otherwise normal. Paraspinal and other soft tissues: Posterior epidural fluid collection is partially decompressed. It extends superiorly to the T5-6 level similar the prior exam. There is less mass effect posteriorly than on the previous study. Collection extends inferiorly to the T9 level. There is an extensive ventral collection extending into the lumbar spine. Abnormal paraspinous soft tissue enhancement extends from T3-4 to T9-10. No drainable collection is present. There is a peripherally enhancing fluid collection the operative bed which measures 6.1 x 1.0 x 2.1 cm. Disc levels: No focal disc disease is present. Abnormal enhancement is present in the foramina on the left T4-5, T5-6, T6-7 T7-8, and T8-9. Right foraminal enhancement is less extensive predominantly involving T6-7, T7-8 T8-9. MRI LUMBAR SPINE FINDINGS Segmentation: 5 non rib-bearing lumbar type vertebral bodies are present. The lowest fully formed vertebral body is L5. Alignment:  No significant listhesis is present. Vertebrae: Progressive signal abnormality and enhancement is present in the vertebral bodies, predominantly at L3 and L4. There is abnormal disc signal without definite disc enhancement at L3-4 and L4-5. There is heterogeneous signal and enhancement posteriorly at L5-S1 which may be related to degenerative change. Conus medullaris: Extends to the L1-2 level and appears normal. Paraspinal and other soft tissues: Patient is status post left laminectomy at L3. Fluid tracks from the laminectomy site into a subcutaneous and  deep soft tissue collection with peripheral enhancement measuring 4.7 x 5.0 x 1.7 cm. The ventral epidural collection extends inferiorly to the L2 level. Separate prominent ventral collection is present posterior to L4 prominently right the left. This collection measures 2.9 x 1.3 cm on the sagittal images. Disc  levels: In addition to the epidural collections, disc disease is again noted at L4-5 and L5-S1. Central foraminal narrowing are associated. IMPRESSION: 1. T7 laminectomy subcutaneous peripherally enhancing fluid collection at this level as well. 2. Residual posterior epidural fluid collection in the thoracic spine with similar cephalo caudad extension from T5-6-T9 but less mass effect. 3. Progressive signal abnormality and enhancement throughout the T7 and T8 vertebral bodies in the T7-8 thoracic disc consistent with disc osteomyelitis. 4. Progressive paraspinous soft tissue enhancement at T3-4 through T9-10 without drainable collection. 5. Persistent extensive ventral epidural collection compatible with abscess. 6. Status post left laminectomy at L3 with peripherally enhancing fluid collection extending from the operative site into the subcutaneous and deep tissues as described. This is concerning abscess. 7. Fluid tracks from the laminectomy site into a subdural deep soft tissue collection measuring 4.7 x 5.0 x 1.7 cm. 8. Separate ventral epidural collection at L4-5 and L5-S1 consistent with epidural abscess. 9. Progressive signal abnormality and enhancement in the L3 and L4 vertebral bodies. 10. 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 Electronically Signed   By: Marin Roberts M.D.   On: 01/05/2021 21:24   MR THORACIC SPINE W WO CONTRAST  Result Date: 12/21/2020 CLINICAL DATA:  55 year old male with lumbar discitis osteomyelitis, worsening lumbar epidural abscess continuing into the lower thoracic spine on MRI yesterday. EXAM: MRI THORACIC WITHOUT AND WITH CONTRAST TECHNIQUE: Multiplanar and multiecho pulse sequences of the thoracic spine were obtained without and with intravenous contrast. CONTRAST:  10mL GADAVIST GADOBUTROL 1 MMOL/ML IV SOLN  COMPARISON:  Lumbar MRI 12/20/2020, 619 22. FINDINGS: Limited cervical spine imaging:  Grossly negative. Thoracic spine segmentation: Normal, concordant with the lumbar numbering. Alignment:  Preserved thoracic kyphosis. Vertebrae: Abnormal marrow edema and patchy enhancement throughout the T7 and T8 vertebral bodies with abnormal intervening disc space, see details below. No other thoracic marrow edema identified. Small benign posterior T4 vertebral body hemangioma suspected. Cord: Upper thoracic spinal cord signal and morphology are normal to the T5 level. See abnormal spinal canal findings from T6 inferiorly detailed below. No associated cord edema is identified. But there is abundant abnormal intradural enhancement, and also some posterior dural thickening described below. Paraspinal and other soft tissues: Circumferential paraspinal phlegmon at T7-T8, see details below. No paraspinal muscle abscess is identified. Negative visible lungs and upper abdominal viscera. Disc levels: C7-T1 through T4-T5 levels are within normal limits. T5-T6: Degenerative moderate to severe right side facet hypertrophy. Degenerative moderate to severe right T5 foraminal stenosis. T6-T7: Disc space remains within normal limits at this level but there is abnormal circumferential dural thickening and enhancement which begins posteriorly at the T5-T6 disc level and continues inferiorly. No stenosis. T7-T8: Abnormal disc space loss with increased T2/STIR hyperintensity and paraspinal phlegmon in keeping with discitis osteomyelitis. Bilateral epidural abscesses including fluid collections emanating from the disc spaces ventrally (series 30, images 9 and 13). Ventral slightly greater than dorsal epidural abscess measuring up to 5 mm in thickness at this level with mild mass effect on the spinal cord. Associated abnormal dural and leptomeningeal enhancement at this level.  T8-T9: Circumferential epidural abscess and abnormal dural and  leptomeningeal enhancement continuing inferiorly from the level above with mild mass effect on the spinal cord (series 29, image 25). There is mild increased signal in this disc space although no convincing T9 endplate inflammation at this time. T9-T10: Continued circumferential epidural abscess, more pronounced ventrally (series 29, image 29). Decreasing dural and leptomeningeal enhancement compared to the levels above. T10-T11: Continued mostly ventral epidural abscess at this level. Mild-to-moderate leptomeningeal and dural enhancement. T11-T12: Continued mostly ventral epidural abscess at this level (series 29, image 35). Mild leptomeningeal and dural enhancement. T12-L1: Mostly ventral epidural abscess. Mild leptomeningeal and dural enhancement (series 33, image 9). IMPRESSION: 1. T7-T8 Discitis Osteomyelitis with multiloculated, circumferential Epidural Abscess associated with abundant leptomeningeal and dural thickening and enhancement. Subsequent compression and mildly tethering the spinal cord at this level. No cord edema. Paraspinal phlegmon but no associated extra-spinal abscess. 2. The abnormal dural thickening continues cephalad to the T5 level. While ventral > dorsal Epidural Abscess along with leptomeningeal thickening and enhancement continues caudally through the thoracolumbar junction. Associated mass effect on the spinal cord from T7 (moderate) through T12 (mild). 3. Possible early discitis also at T8-T9. Electronically Signed   By: Odessa Fleming M.D.   On: 12/21/2020 06:20   MR Lumbar Spine W Wo Contrast  Result Date: 01/05/2021 CLINICAL DATA:  Spine surgery 2 weeks ago. Increasing pain for the last 2 days. Redness and swelling to the operative site. EXAM: MRI THORACIC AND LUMBAR SPINE WITHOUT AND WITH CONTRAST TECHNIQUE: Multiplanar and multiecho pulse sequences of the thoracic and lumbar spine were obtained without and with intravenous contrast. CONTRAST:  66mL GADAVIST GADOBUTROL 1 MMOL/ML IV  SOLN COMPARISON:  None. FINDINGS: MRI THORACIC SPINE FINDINGS Alignment:  No significant listhesis is present. Vertebrae: Diffuse T2 hyperintensity enhancement throughout the T7 and T8 vertebral bodies has increased. Patient is status post T7 laminectomy. Focal enhancement is again noted at the T4 vertebral body. Cord: Cord is displaced posteriorly by a epidural fluid T6-7. Cord size and signal are otherwise normal. Paraspinal and other soft tissues: Posterior epidural fluid collection is partially decompressed. It extends superiorly to the T5-6 level similar the prior exam. There is less mass effect posteriorly than on the previous study. Collection extends inferiorly to the T9 level. There is an extensive ventral collection extending into the lumbar spine. Abnormal paraspinous soft tissue enhancement extends from T3-4 to T9-10. No drainable collection is present. There is a peripherally enhancing fluid collection the operative bed which measures 6.1 x 1.0 x 2.1 cm. Disc levels: No focal disc disease is present. Abnormal enhancement is present in the foramina on the left T4-5, T5-6, T6-7 T7-8, and T8-9. Right foraminal enhancement is less extensive predominantly involving T6-7, T7-8 T8-9. MRI LUMBAR SPINE FINDINGS Segmentation: 5 non rib-bearing lumbar type vertebral bodies are present. The lowest fully formed vertebral body is L5. Alignment:  No significant listhesis is present. Vertebrae: Progressive signal abnormality and enhancement is present in the vertebral bodies, predominantly at L3 and L4. There is abnormal disc signal without definite disc enhancement at L3-4 and L4-5. There is heterogeneous signal and enhancement posteriorly at L5-S1 which may be related to degenerative change. Conus medullaris: Extends to the L1-2 level and appears normal. Paraspinal and other soft tissues: Patient is status post left laminectomy at L3. Fluid tracks from the laminectomy site into a subcutaneous and deep soft tissue  collection with peripheral enhancement measuring 4.7 x 5.0 x 1.7 cm. The ventral epidural collection  extends inferiorly to the L2 level. Separate prominent ventral collection is present posterior to L4 prominently right the left. This collection measures 2.9 x 1.3 cm on the sagittal images. Disc levels: In addition to the epidural collections, disc disease is again noted at L4-5 and L5-S1. Central foraminal narrowing are associated. IMPRESSION: 1. T7 laminectomy subcutaneous peripherally enhancing fluid collection at this level as well. 2. Residual posterior epidural fluid collection in the thoracic spine with similar cephalo caudad extension from T5-6-T9 but less mass effect. 3. Progressive signal abnormality and enhancement throughout the T7 and T8 vertebral bodies in the T7-8 thoracic disc consistent with disc osteomyelitis. 4. Progressive paraspinous soft tissue enhancement at T3-4 through T9-10 without drainable collection. 5. Persistent extensive ventral epidural collection compatible with abscess. 6. Status post left laminectomy at L3 with peripherally enhancing fluid collection extending from the operative site into the subcutaneous and deep tissues as described. This is concerning abscess. 7. Fluid tracks from the laminectomy site into a subdural deep soft tissue collection measuring 4.7 x 5.0 x 1.7 cm. 8. Separate ventral epidural collection at L4-5 and L5-S1 consistent with epidural abscess. 9. Progressive signal abnormality and enhancement in the L3 and L4 vertebral bodies. 10. 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 Electronically Signed   By: Marin Roberts M.D.   On: 01/05/2021 21:24   MR Lumbar Spine W Wo Contrast  Result Date: 12/20/2020 CLINICAL DATA:  Follow-up spinal infection. EXAM: MRI LUMBAR SPINE WITHOUT AND WITH CONTRAST TECHNIQUE:  Multiplanar and multiecho pulse sequences of the lumbar spine were obtained without and with intravenous contrast. CONTRAST:  9mL GADAVIST GADOBUTROL 1 MMOL/ML IV SOLN COMPARISON:  12/12/2020 FINDINGS: Segmentation: 5 lumbar type vertebral bodies as numbered previously. Alignment:  No malalignment. Vertebrae: Redemonstration of discitis with early endplate osteomyelitis at the L3-4, L4-5 and L5-S1 levels. No evidence of aggressive endplate destruction since the previous study. See below. Conus medullaris and cauda equina: Conus tip is at the L1-2 level. There is been development of a large ventral epidural abscess present at the upper edge of the scan at the T11 level and extending down to inferior L2. Maximal thickness behind L1 is 8 mm. This displaces thecal sac posteriorly. Additional epidural abscess is present behind L3 and L4, also with mass-effect upon the thecal sac. Paraspinal and other soft tissues: Worsening of paraspinous inflammatory changes affecting both psoas muscles left worse than right. 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 Disc levels: No disc space pathology seen from T11-12 through L1-2. L2-3: Mild bulging of the disc. L3-4 and L4-5: Discitis osteomyelitis as discussed above. Annular bulging. L5-S1: Findings at this level or most consistent with chronic degenerative spondylosis, probably with an element of early disc space infection at this level. Endplate osteophytes and bulging of the disc result in foraminal narrowing right more than left with some potential to affect the exiting right L5 nerve. IMPRESSION: Marked worsening of epidural disease as above, with epidural abscess extending up to the upper edge of the scan, at T11. Extension above that is likely but not characterized. Maximal thickness of the ventral epidural abscess at L1 is 8 mm. Increasing epidural abscess also behind L3 and L4. Worsening paraspinous inflammatory changes left worse than right with frank psoas  abscesses now present on the left. The largest single abscess shows a diameter of 1.7 cm. Continued evidence discitis osteomyelitis definitely  at L3-4 and L4-5 and possibly at L5-S1. Changes at the disc levels themselves are fairly minor since the previous study, without aggressive endplate destruction. Electronically Signed   By: Paulina Fusi M.D.   On: 12/20/2020 14:40   MR Abdomen W or Wo Contrast  Result Date: 12/12/2020 CLINICAL DATA:  Evaluate left kidney cyst EXAM: MRI ABDOMEN WITHOUT AND WITH CONTRAST TECHNIQUE: Multiplanar multisequence MR imaging of the abdomen was performed both before and after the administration of intravenous contrast. CONTRAST:  9.94mL GADAVIST GADOBUTROL 1 MMOL/ML IV SOLN COMPARISON:  CT AP 12/11/2020 FINDINGS: Lower chest: No acute findings. Hepatobiliary: No mass or other parenchymal abnormality identified. Pancreas: No mass, inflammatory changes, or other parenchymal abnormality identified. Spleen:  Within normal limits in size and appearance. Adrenals/Urinary Tract: Normal adrenal glands. Bilateral perinephric fat stranding is identified. No hydronephrosis identified bilaterally. Cyst arising off the inferior pole of the left kidney measures 4.2 by 3.3 cm, image 35/7. There is no internal septation or mural nodule identified. No enhancement identified. Stomach/Bowel: Visualized portions within the abdomen are unremarkable. Vascular/Lymphatic: No pathologically enlarged lymph nodes identified. No abdominal aortic aneurysm demonstrated. Other:  None. Musculoskeletal: There are signs of discitis/osteomyelitis involving the L3-4, L4-5 and L5-S1 levels. Surrounding edema and enhancement extends into the left psoas muscle. No discrete fluid collection identified to suggest abscess. IMPRESSION: 1. 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 No suspicious kidney lesions identified at this time. 2. Findings compatible with discitis/osteomyelitis  involving the L3-4, L4-5 and L5-S1 levels. Surrounding edema and enhancement extends into the left psoas muscle. No discrete fluid collection identified to suggest abscess. See lumbar spine report from today. Electronically Signed   By: Signa Kell M.D.   On: 12/12/2020 20:49   CT ABDOMEN PELVIS W CONTRAST  Result Date: 12/16/2020 CLINICAL DATA:  Abdominal distension Nausea/vomiting Bowel obstruction suspected Abdominal pain, acute, nonlocalized EXAM: CT ABDOMEN AND PELVIS WITH CONTRAST TECHNIQUE: Multidetector CT imaging of the abdomen and pelvis was performed using the standard protocol following bolus administration of intravenous contrast. CONTRAST:  OMNIPAQUE IOHEXOL 300 MG/ML  SOLN COMPARISON:  Noncontrast CT 5 days ago 12/11/2020. Abdominal MRI 12/12/2020 FINDINGS: Lower chest: Trace right pleural effusion. No focal airspace disease. Hepatobiliary: No focal liver abnormality is seen. No gallstones, gallbladder wall thickening, or biliary dilatation. Pancreas: No ductal dilatation or inflammation. Spleen: Normal in size without focal abnormality. Adrenals/Urinary Tract: No adrenal nodule. No hydronephrosis. Similar bilateral perinephric edema. No renal calculi. Cyst arising from the lower left kidney with peripheral calcifications measures 4.7 cm. 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 No new renal abnormality. Unremarkable urinary bladder. Stomach/Bowel: Minimal fluid in the distal esophagus. Stomach physiologically distended without abnormal gastric distension. There are occasional fluid-filled loops of small bowel without abnormal distension, wall thickening or inflammation. No small bowel obstruction. Normal appendix. Mild colonic distension with air and stool. Mild distal colonic diverticulosis. No diverticulitis, colonic wall thickening, or acute colonic inflammation. There is no colonic obstruction. Vascular/Lymphatic: Moderate aortic atherosclerosis. No aortic  aneurysm. Patent portal vein. There are small retroperitoneal lymph nodes not enlarged by size criteria. No inguinal or pelvic adenopathy. Reproductive: Prostatic calcifications. Other: No abdominopelvic ascites. No free air or focal fluid collection. Small fat containing umbilical hernia. There is mild body wall edema. Musculoskeletal: Findings of discitis osteomyelitis at L3-L4, L4-L5, and L5-S1 were better appreciated on prior lumbar MRI. There is endplate sclerosis at L4-L5 and L5-S1 that appears similar, slight increase endplate  irregularity at L3-L4 disc space from prior. Left psoas inflammatory change with enlargement and low density. There is a small 1.4 x 2.2 cm collection within the muscle, series 3, image 52 and series 6, image 59, with small foci of internal air and faint peripheral enhancement. Anterior paraspinal soft tissue thickening at L3-L4 level. IMPRESSION: 1. 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 Slight increase in endplate irregularity at the L3-L4 level. Left psoas myositis. There is a small ill-defined 1.4 x 2.2 cm collection within the left psoas muscle with small foci of internal air and faint peripheral enhancement, suspicious for phlegmon/developing abscess. 2. No bowel obstruction or other acute abnormality in the abdomen/pelvis. 3. Mild distal colonic diverticulosis without diverticulitis. Mild colonic distension with air and stool but no inflammatory change or obstruction. 4. Trace right pleural effusion. 5. 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 Aortic Atherosclerosis (ICD10-I70.0). Electronically Signed   By: Narda Rutherford M.D.   On: 12/16/2020 20:48   DG Chest Port 1 View  Result Date: 12/29/2020 CLINICAL DATA:  Shortness of breath EXAM: PORTABLE CHEST 1 VIEW COMPARISON:  12/12/2020 FINDINGS: Postoperative changes in the mediastinum. Right PICC line with tip over  the cavoatrial junction region. Heart size and pulmonary vascularity are normal. Lungs are clear. No pleural effusions. No pneumothorax. Mediastinal contours appear intact. IMPRESSION: No active disease. Electronically Signed   By: Burman Nieves M.D.   On: 12/29/2020 21:01   DG C-Arm 1-60 Min  Result Date: 12/21/2020 CLINICAL DATA:  Intraoperative localization for thoracic and lumbar laminectomy EXAM: THORACOLUMBAR SPINE - 2 VIEW; DG C-ARM 1-60 MIN COMPARISON:  None. FLUOROSCOPY TIME:  Fluoroscopy Time:  26 seconds Radiation Exposure Index (if provided by the fluoroscopic device): 14.21 mGy Number of Acquired Spot Images: 3 FINDINGS: Three spot films were obtained intraoperatively. Initial spot film shows a needle just below the L3-4 interspace in the posterior soft tissues. Subsequent images demonstrate needle localization in the posterior soft tissues at the T7-T8 level and just below the T10-T11 level. Third image shows inferior localization at T12. IMPRESSION: Intraoperative localization in the thoracic and lumbar spine as described Electronically Signed   By: Alcide Clever M.D.   On: 12/21/2020 16:15   ECHOCARDIOGRAM COMPLETE  Result Date: 01/07/2021    ECHOCARDIOGRAM REPORT   Patient Name:   Louis [name]  Date of Exam: 01/07/2021 Medical Rec #:  [id number]  Height:       71.0 in Accession #:    [id number] Weight:       215.2 lb Date of Birth:  December 11, 1965   BSA:          2.175 m Patient Age:    55 years   BP:           164/94 mmHg Patient Gender: M          HR:           116 bpm. Exam Location:  Inpatient Procedure: 2D Echo, Cardiac Doppler and Color Doppler Indications:    Bacteremia  History:        Patient has prior history of Echocardiogram examinations, most                 recent 12/15/2020. CAD, Prior CABG; Risk Factors:Hypertension and                 Dyslipidemia.  Sonographer:    Ross Ludwig RDCS (AE) Referring  Phys: [id number] Kathlynn Grate IMPRESSIONS  1. Left ventricular ejection fraction, by  estimation, is 55 to 60%. The left ventricle has normal function. The left ventricle has no regional wall motion abnormalities. There is moderate left ventricular hypertrophy. Left ventricular diastolic parameters are indeterminate.  2. Right ventricular systolic function is normal. The right ventricular size is normal.  3. The mitral valve is normal in structure. No evidence of mitral valve regurgitation. No evidence of mitral stenosis.  4. The aortic valve is tricuspid. Aortic valve regurgitation is not visualized. No aortic stenosis is present.  5. The inferior vena cava is dilated in size with >50% respiratory variability, suggesting right atrial pressure of 8 mmHg. FINDINGS  Left Ventricle: Left ventricular ejection fraction, by estimation, is 55 to 60%. The left ventricle has normal function. The left ventricle has no regional wall motion abnormalities. The left ventricular internal cavity size was normal in size. There is  moderate left ventricular hypertrophy. Left ventricular diastolic parameters are indeterminate. Right Ventricle: The right ventricular size is normal. Right ventricular systolic function is normal. Left Atrium: Left atrial size was normal in size. Right Atrium: Right atrial size was normal in size. Pericardium: There is no evidence of pericardial effusion. Mitral Valve: The mitral valve is normal in structure. No evidence of mitral valve regurgitation. No evidence of mitral valve stenosis. Tricuspid Valve: The tricuspid valve is normal in structure. Tricuspid valve regurgitation is trivial. No evidence of tricuspid stenosis. Aortic Valve: The aortic valve is tricuspid. Aortic valve regurgitation is not visualized. No aortic stenosis is present. Aortic valve mean gradient measures 2.0 mmHg. Aortic valve peak gradient measures 3.1 mmHg. Pulmonic Valve: The pulmonic valve was normal in structure. Pulmonic valve regurgitation is not visualized. No evidence of pulmonic stenosis. Aorta: The  aortic root is normal in size and structure. Venous: The inferior vena cava is dilated in size with greater than 50% respiratory variability, suggesting right atrial pressure of 8 mmHg. IAS/Shunts: No atrial level shunt detected by color flow Doppler.  LEFT VENTRICLE PLAX 2D LVIDd:         4.00 cm LVIDs:         3.40 cm LV PW:         1.60 cm LV IVS:        1.50 cm LVOT diam:     2.50 cm LV SV:         79 LV SV Index:   36 LVOT Area:     4.91 cm  RIGHT VENTRICLE             IVC RV Basal diam:  3.30 cm     IVC diam: 2.20 cm RV S prime:     13.40 cm/s TAPSE (M-mode): 1.3 cm LEFT ATRIUM             Index       RIGHT ATRIUM           Index LA diam:        3.50 cm 1.61 cm/m  RA Area:     14.90 cm LA Vol (A2C):   69.4 ml 31.91 ml/m RA Volume:   36.10 ml  16.60 ml/m LA Vol (A4C):   51.3 ml 23.59 ml/m LA Biplane Vol: 62.6 ml 28.78 ml/m  AORTIC VALVE AV Area (Vmax):    6.50 cm AV Area (Vmean):   5.74 cm AV Area (VTI):     5.53 cm AV Vmax:  87.60 cm/s AV Vmean:          59.600 cm/s AV VTI:            0.142 m AV Peak Grad:      3.1 mmHg AV Mean Grad:      2.0 mmHg LVOT Vmax:         116.00 cm/s LVOT Vmean:        69.700 cm/s LVOT VTI:          0.160 m LVOT/AV VTI ratio: 1.13  AORTA Ao Root diam: 3.50 cm Ao Asc diam:  3.60 cm  SHUNTS Systemic VTI:  0.16 m Systemic Diam: 2.50 cm Olga Millers MD Electronically signed by Olga Millers MD Signature Date/Time: 01/07/2021/12:45:33 PM    Final    ECHOCARDIOGRAM COMPLETE  Result Date: 12/15/2020    ECHOCARDIOGRAM REPORT   Patient Name:   Louis [name]  Date of Exam: 12/15/2020 Medical Rec #:  [id number]  Height:       71.0 in Accession #:    [id number] Weight:       207.0 lb Date of Birth:  [birth year]   BSA:          2.140 m Patient Age:    55 years   BP:           109/76 mmHg Patient Gender: M          HR:           104 bpm. Exam Location:  Inpatient Procedure: 2D Echo, Cardiac Doppler and Color Doppler Indications:    Bacteremia  History:        Patient has no prior  history of Echocardiogram examinations.                 CAD; Risk Factors:Dyslipidemia and Diabetes.  Sonographer:    Shirlean Kelly Referring Phys: [id number] Azucena Fallen IMPRESSIONS  1. Left ventricular ejection fraction, by estimation, is 50 to 55%. The left ventricle has low normal function. The left ventricle has no regional wall motion abnormalities. There is moderate concentric left ventricular hypertrophy. Left ventricular diastolic parameters are consistent with Grade I diastolic dysfunction (impaired relaxation).  2. Right ventricular systolic function is normal. The right ventricular size is normal.  3. Left atrial size was mildly dilated.  4. The mitral valve is normal in structure. No evidence of mitral valve regurgitation. No evidence of mitral stenosis.  5. The aortic valve is tricuspid. Aortic valve regurgitation is not visualized. No aortic stenosis is present.  6. The inferior vena cava is normal in size with greater than 50% respiratory variability, suggesting right atrial pressure of 3 mmHg. FINDINGS  Left Ventricle: Left ventricular ejection fraction, by estimation, is 50 to 55%. The left ventricle has low normal function. The left ventricle has no regional wall motion abnormalities. The left ventricular internal cavity size was normal in size. There is moderate concentric left ventricular hypertrophy. Left ventricular diastolic parameters are consistent with Grade I diastolic dysfunction (impaired relaxation). Indeterminate filling pressures. Right Ventricle: The right ventricular size is normal. No increase in right ventricular wall thickness. Right ventricular systolic function is normal. Left Atrium: Left atrial size was mildly dilated. Right Atrium: Right atrial size was normal in size. Pericardium: There is no evidence of pericardial effusion. Mitral Valve: The mitral valve is normal in structure. No evidence of mitral valve regurgitation. No evidence of mitral valve stenosis.  Tricuspid Valve: The tricuspid valve is normal in structure. Tricuspid valve regurgitation is  not demonstrated. No evidence of tricuspid stenosis. Aortic Valve: The aortic valve is tricuspid. Aortic valve regurgitation is not visualized. No aortic stenosis is present. Aortic valve mean gradient measures 5.0 mmHg. Aortic valve peak gradient measures 8.9 mmHg. Aortic valve area, by VTI measures 3.50 cm. Pulmonic Valve: The pulmonic valve was normal in structure. Pulmonic valve regurgitation is not visualized. No evidence of pulmonic stenosis. Aorta: The aortic root is normal in size and structure. Venous: The inferior vena cava is normal in size with greater than 50% respiratory variability, suggesting right atrial pressure of 3 mmHg. IAS/Shunts: No atrial level shunt detected by color flow Doppler.  LEFT VENTRICLE PLAX 2D LVIDd:         4.10 cm  Diastology LVIDs:         3.00 cm  LV e' medial:    8.27 cm/s LV PW:         1.30 cm  LV E/e' medial:  10.9 LV IVS:        1.60 cm  LV e' lateral:   13.20 cm/s LVOT diam:     2.40 cm  LV E/e' lateral: 6.8 LV SV:         84 LV SV Index:   39 LVOT Area:     4.52 cm  RIGHT VENTRICLE             IVC RV S prime:     12.00 cm/s  IVC diam: 1.25 cm TAPSE (M-mode): 1.4 cm LEFT ATRIUM             Index       RIGHT ATRIUM           Index LA diam:        3.10 cm 1.45 cm/m  RA Area:     14.80 cm LA Vol (A2C):   47.8 ml 22.34 ml/m RA Volume:   39.60 ml  18.51 ml/m LA Vol (A4C):   69.8 ml 32.62 ml/m LA Biplane Vol: 58.2 ml 27.20 ml/m  AORTIC VALVE AV Area (Vmax):    4.28 cm AV Area (Vmean):   3.91 cm AV Area (VTI):     3.50 cm AV Vmax:           149.00 cm/s AV Vmean:          104.000 cm/s AV VTI:            0.239 m AV Peak Grad:      8.9 mmHg AV Mean Grad:      5.0 mmHg LVOT Vmax:         141.00 cm/s LVOT Vmean:        90.000 cm/s LVOT VTI:          0.185 m LVOT/AV VTI ratio: 0.77  AORTA Ao Root diam: 3.20 cm Ao Asc diam:  3.40 cm MITRAL VALVE MV Area (PHT): 4.60 cm    SHUNTS MV  Decel Time: 165 msec    Systemic VTI:  0.18 m MV E velocity: 90.00 cm/s  Systemic Diam: 2.40 cm MV A velocity: 86.60 cm/s MV E/A ratio:  1.04 Chilton Si MD Electronically signed by Chilton Si MD Signature Date/Time: 12/15/2020/2:47:29 PM    Final    CT IMAGE GUIDED DRAINAGE BY PERCUTANEOUS CATHETER  Result Date: 12/23/2020 INDICATION: Left psoas abscess EXAM: CT GUIDED DRAINAGE OF LEFT PSOAS ABSCESS ABSCESS MEDICATIONS: The patient is currently admitted to the hospital and receiving intravenous antibiotics. The antibiotics were administered within an appropriate time frame prior  to the initiation of the procedure. ANESTHESIA/SEDATION: 4 mg IV Versed 100 mcg IV Fentanyl Moderate Sedation Time:  14 minutes The patient was continuously monitored during the procedure by the interventional radiology nurse under my direct supervision. COMPLICATIONS: None immediate. TECHNIQUE: Informed written consent was obtained from the patient after a thorough discussion of the procedural risks, benefits and alternatives. All questions were addressed. Maximal Sterile Barrier Technique was utilized including caps, mask, sterile gowns, sterile gloves, sterile drape, hand hygiene and skin antiseptic. A timeout was performed prior to the initiation of the procedure. PROCEDURE: The the left flank skin was prepped with chlorhexidine in a sterile fashion, and a sterile drape was applied covering the operative field. A sterile gown and sterile gloves were used for the procedure. Local anesthesia was provided with 1% Lidocaine. Utilizing CT guidance, 19 gauge Yueh needle was advanced into the pocket of air and fluid in the left psoas muscle. 5 mL of bloody purulent material was aspirated. Samples were sent for Gram stain and culture. Post aspiration CT demonstrated resolution of the air and fluid. IMPRESSION: CT-guided aspiration of left psoas abscess yielding 5 mL of bloody purulent material. Electronically Signed   By: Mauri Reading   Mir M.D.   On: 12/23/2020 07:39   [place] EKG SITE RITE  Result Date: 12/25/2020 If Site Rite image not attached, placement could not be confirmed due to current cardiac rhythm.   Labs:  CBC: Recent Labs    12/29/20 2006 01/05/21 1705 01/06/21 0310 01/07/21 0652  WBC 22.1* 23.9* 31.7* 30.5*  HGB 11.0* 8.6* 12.4* 11.4*  HCT 32.8* 25.9* 38.0* 35.5*  PLT 574* 681* 457* 863*    COAGS: Recent Labs    12/12/20 0025  INR 1.3*  APTT 28    BMP: Recent Labs    12/29/20 2006 01/05/21 1705 01/06/21 0310 01/07/21 0652  NA 124* 129* 130* 134*  K 3.4* 4.3 4.7 4.4  CL 86* 91* 93* 100  CO2 26 24 19* 14*  GLUCOSE 151* 104* 100* 138*  BUN 9 7 7 9   CALCIUM 7.8* 8.0* 8.4* 8.5*  CREATININE 0.84 0.74 0.74 0.96  GFRNONAA >60 >60 >60 >60    LIVER FUNCTION TESTS: Recent Labs    12/13/20 0046 12/17/20 0304 01/06/21 0310 01/07/21 0652  BILITOT 1.9* 0.9 1.4* 1.5*  AST 44* 78* 22 21  ALT 40 38 16 16  ALKPHOS 65 97 137* 127*  PROT 5.9* 5.7* 6.3* 6.1*  ALBUMIN 2.9* 2.4* 1.6* 1.7*    TUMOR MARKERS: No results for input(s): AFPTM, CEA, CA199, CHROMGRNA in the last 8760 hours.  Assessment and Plan: Fluid collection tracking from the laminectomy site into a subcutaneous and deep soft tissue collection with peripheral enhancement measuring 4.7 x 5.0 x 1.7 cm. Imaging reviewed, amenable to image guided drainage. Will not likely need sedation but will make NPO p MN as precaution. Explained procedure to pt and wife at bedside, she is agreeable and has signed consent.   Thank you for this interesting consult.  I greatly enjoyed meeting Louis [name] and look forward to participating in their care.  A copy of this report was sent to the requesting provider on this date.  Electronically Signed: Brayton El, PA-C 01/07/2021, 5:10 PM   I spent a total of 20 minutes in face to face in clinical consultation, greater than 50% of which was counseling/coordinating care for drainage of  paraspinous fluid collection

## 2021-01-08 ENCOUNTER — Inpatient Hospital Stay (HOSPITAL_COMMUNITY): Payer: Federal, State, Local not specified - PPO

## 2021-01-08 DIAGNOSIS — R7881 Bacteremia: Secondary | ICD-10-CM | POA: Diagnosis not present

## 2021-01-08 DIAGNOSIS — F419 Anxiety disorder, unspecified: Secondary | ICD-10-CM | POA: Diagnosis not present

## 2021-01-08 DIAGNOSIS — I2510 Atherosclerotic heart disease of native coronary artery without angina pectoris: Secondary | ICD-10-CM | POA: Diagnosis not present

## 2021-01-08 DIAGNOSIS — G062 Extradural and subdural abscess, unspecified: Secondary | ICD-10-CM | POA: Diagnosis not present

## 2021-01-08 LAB — COMPREHENSIVE METABOLIC PANEL
ALT: 29 U/L (ref 0–44)
AST: 58 U/L — ABNORMAL HIGH (ref 15–41)
Albumin: 1.6 g/dL — ABNORMAL LOW (ref 3.5–5.0)
Alkaline Phosphatase: 123 U/L (ref 38–126)
Anion gap: 10 (ref 5–15)
BUN: 8 mg/dL (ref 6–20)
CO2: 26 mmol/L (ref 22–32)
Calcium: 8.3 mg/dL — ABNORMAL LOW (ref 8.9–10.3)
Chloride: 99 mmol/L (ref 98–111)
Creatinine, Ser: 0.65 mg/dL (ref 0.61–1.24)
GFR, Estimated: >60 mL/min (ref >60)
Glucose, Bld: 180 mg/dL — ABNORMAL HIGH (ref 70–99)
Potassium: 3.7 mmol/L (ref 3.5–5.1)
Sodium: 135 mmol/L (ref 135–145)
Total Bilirubin: 0.7 mg/dL (ref 0.3–1.2)
Total Protein: 5.6 g/dL — ABNORMAL LOW (ref 6.5–8.1)

## 2021-01-08 LAB — CBC WITH DIFFERENTIAL/PLATELET
Abs Immature Granulocytes: 0.57 10*3/uL — ABNORMAL HIGH (ref 0.00–0.07)
Basophils Absolute: 0.1 10*3/uL (ref 0.0–0.1)
Basophils Relative: 0 %
Eosinophils Absolute: 0.1 10*3/uL (ref 0.0–0.5)
Eosinophils Relative: 0 %
HCT: 32.3 % — ABNORMAL LOW (ref 39.0–52.0)
Hemoglobin: 10.9 g/dL — ABNORMAL LOW (ref 13.0–17.0)
Immature Granulocytes: 2 %
Lymphocytes Relative: 9 %
Lymphs Abs: 2.5 10*3/uL (ref 0.7–4.0)
MCH: 28.8 pg (ref 26.0–34.0)
MCHC: 33.7 g/dL (ref 30.0–36.0)
MCV: 85.4 fL (ref 80.0–100.0)
Monocytes Absolute: 2.1 10*3/uL — ABNORMAL HIGH (ref 0.1–1.0)
Monocytes Relative: 8 %
Neutro Abs: 22.1 10*3/uL — ABNORMAL HIGH (ref 1.7–7.7)
Neutrophils Relative %: 81 %
Platelets: 812 10*3/uL — ABNORMAL HIGH (ref 150–400)
RBC: 3.78 MIL/uL — ABNORMAL LOW (ref 4.22–5.81)
RDW: 16 % — ABNORMAL HIGH (ref 11.5–15.5)
WBC: 27.4 10*3/uL — ABNORMAL HIGH (ref 4.0–10.5)
nRBC: 0 % (ref 0.0–0.2)

## 2021-01-08 LAB — CULTURE, BLOOD (ROUTINE X 2)

## 2021-01-08 LAB — GLUCOSE, CAPILLARY
Glucose-Capillary: 165 mg/dL — ABNORMAL HIGH (ref 70–99)
Glucose-Capillary: 198 mg/dL — ABNORMAL HIGH (ref 70–99)
Glucose-Capillary: 204 mg/dL — ABNORMAL HIGH (ref 70–99)
Glucose-Capillary: 207 mg/dL — ABNORMAL HIGH (ref 70–99)
Glucose-Capillary: 210 mg/dL — ABNORMAL HIGH (ref 70–99)
Glucose-Capillary: 212 mg/dL — ABNORMAL HIGH (ref 70–99)
Glucose-Capillary: 240 mg/dL — ABNORMAL HIGH (ref 70–99)

## 2021-01-08 LAB — C-REACTIVE PROTEIN: CRP: 21.8 mg/dL — ABNORMAL HIGH (ref <1.0)

## 2021-01-08 LAB — MAGNESIUM: Magnesium: 1.8 mg/dL (ref 1.7–2.4)

## 2021-01-08 LAB — SEDIMENTATION RATE: Sed Rate: 45 mm/hr — ABNORMAL HIGH (ref 0–16)

## 2021-01-08 LAB — PHOSPHORUS: Phosphorus: 3.1 mg/dL (ref 2.5–4.6)

## 2021-01-08 MED ORDER — SODIUM CHLORIDE 0.9 % IV SOLN: INTRAVENOUS | Status: DC

## 2021-01-08 MED ORDER — LIDOCAINE HCL (PF) 1 % IJ SOLN
INTRAMUSCULAR | Status: AC
  Filled 2021-01-08: qty 30

## 2021-01-08 NOTE — Progress Notes (Signed)
PROGRESS NOTE    Louis [name]  XAJ:[id number] DOB: October 21, 1965 DOA: 01/05/2021 PCP: Ray Church, NP   Brief Narrative:  Patient is a 55 year old Caucasian male with a past medical history significant for but not limited to type 2 diabetes mellitus, hypertension, history of CAD with CABG in 2017, anxiety, chronic pain as well as other comorbidities with recent admission with an epidural abscess, vertebral osteomyelitis, psoas abscess and MRSA bacteremia now returning to the ED with worsening back pain.  He reported that his back pain has been stable until approximately 5 days ago when it became more severe and progressively worsened since then.  He did not notice any new leg numbness or weakness did have some urinary incontinence.  Back pain was reportedly severe and constant worse with movement and radiating to both his legs.  He denied any fevers or chills but stated he did have some mild drainage from his surgical wounds.  He missed his dose of antibiotics yesterday but has not missed any other doses.  He been admitted on 12/12/2020 after he is found to have an epidural abscess and vertebral osteomyelitis and after over there is no neurosurgery or ID backup.  He underwent a laminectomy and evacuation of abscess on 12/13/2020 and then drainage of the psoas abscess by IR on 12/22/2020.  Blood cultures from 12/14/2018 grew out MRSA but there is no vegetation seen on TTE and blood cultures were drawn and repeated 12/15/2020 remain negative.  He has been on daptomycin at home.  Upon arrival to ED is found to be saturating well on room air but he did have a leukocytosis 23,900 and platelet count of 681,000.  COVID PCR was negative.  He had an MRI of the thoracic and lumbar spine which revealed multiple fluid collections involving the ventral epidural abscess at the L4-L5 level with progressive discitis and osteomyelitis at the L4-L5 and L5-S1 levels.  Blood cultures were repeated in the ED and he was given multiple  doses of IV analgesics and he is evaluated by neurosurgery who recommended medical admission.  Initial plan was for lumbar decompression however Dr. Reatha Armour of neurosurgery evaluated and felt that his thoracic mass-effect was improved and that even though there was persistent ostial discitis and epidural abscess throughout the thoracolumbar spine there is possibly a more loculated focus of the abscess at L4-L5 with stenosis but no focal weakness, groin numbness or numbness and tingling in his legs he did not believe that urgent or emergent decompression was needed at this time.  Patient has been confused more often per his wife and neurosurgery is obtaining MRI of the brain with and without contrast given concern of some cerebral spread of infection.  Because he has failed multiple medical treatments will likely need broaden antibiotic therapy and will consult ID and neurosurgery feels that the stenosis L4-L5 could be decompressed however neurologically patient is doing well with no focal deficits and therefore they are recommending aggressive medical and supportive care.  ID Changed to IV Vancomycin and consulted IR for drainage of the deep soft tissue collection with peripheral enhancement measuring 4.7 x 5.0 x 1.7 cm.  The image guided drainage was done today by Dr. Vernard Gambles.  Dr. Reatha Armour of neurosurgery recommending continuing vancomycin and aggressive medical care.  Assessment & Plan:   Principal Problem:   MRSA bacteremia Active Problems:   CAD (coronary artery disease)   HTN (hypertension)   Uncontrolled diabetes mellitus (HCC)   Epidural abscess   Normocytic anemia   Hyponatremia  Anxiety   Chronic diastolic CHF (congestive heart failure) (HCC)  Sepsis secondary to epidural abscess; discitis/osteomyelitis; MRSA bacteremia     -Patient met sepsis physiology on admission as he was tachycardic, tachypneic, had a leukocytosis and a source of Infection  -Pt with laminectomy & evacuation of  abscess 12/21/20, IR drainage of psoas abscess 6/29, and MRSA bacteremia currently on daptomycin presents with worsening back pain and found on MRI L- and T-spine to have multiple fluid collections including ventral epidural abscess and progressive osteomyelitis at L 4-5 -Blood cultures grew MRSA on 6/20, were negative 6/22, no vegetation seen on TTE 12/15/20  - Neurosurgery was planning for L4-5 decompression 01/06/21 but re-evaluated and since patient is Neurologically intact will hold off and treat medically do not feel that urgent or emergent decompression is needed at this time -Give another 1-1/2 L bolus today and start the patient on sodium bicarb at 75 MLS per hour -Patient continues to be tachycardic and will continue with metoprolol tartrate 25 mg p.o. nightly and also start labetalol IV -WBC is gone from 23.9 is now 31.7 -> 30.5 and is further improved to 27.4 -CRP went from 31.1 -> 27.8 and is further improved to 21.8 -Continue IV antibiotics, and pain-control  -Check a UDS -We will formally consult ID for further evaluation recommendations; ID recommending stopping daptomycin and switching to vancomycin per pharmacy to dose.  We will order a TTE and if this is negative proceeding with a TEE -ID is also ordered a CT of the pelvis with contrast to further evaluate reactivation of psoas abscess and they are repeating blood cultures and removed his PICC line; CT of the pelvis showed "No pelvic abscess or acute osseous abnormality identified. Trace free fluid in the deep pelvis, likely related to anasarca. Subtle residual of the left psoas abscess at the L5 level which was drained last month. Infected lumbar levels minimally included on this exam. See Lumbar MRI 01/05/2021." -ID is now consulted interventional radiology for the fluid collection mentioned in the deep subdural soft tissue measuring 4.7 x 5.0 x 1.7 cm; patient underwent drainage today of this collection by Dr. Vernard Gambles -ID is now also  sending susceptibility testing to daptomycin, ceftrarloibne and linezolid -Cardiology been consulted for TEE and this is going to be done Monday, 01/10/2021   Confusion improving and wife thinks he is talking more -Neurosurgery is obtaining a MRI of the brain with and without contrast given that he does have an extensive epidural abscess the spine with encephalopathy -MRI Brain showed "Significantly motion degraded examination, as described and limiting evaluation.   Within this limitation, no definite acute intracranial abnormality is identified. Mild generalized parenchymal atrophy and cerebral white matter chronic small vessel ischemic disease. Mild paranasal sinus disease, as described" -Patient's wife stated that he was talking a little bit more today.  Unwitnessed Fall [place] sometime early yesterday morning -Repeat head CT showed no acute intracranial abnormalities noted -Fall Precautions    Normocytic Anemia  - Hgb is 8.6, down from 11.0 on 12/29/20; repeat hemoglobin/hematocrit is now 12.4/38.0 -> 11.4/35.5 -> 10.9/32.3 - No overt bleeding  -Checked anemia panel and showed iron level of 19, U IBC 139, TIBC 158, saturation ratios of 12%, ferritin level 864, folate of 2.5, vitamin B12 level 1070 -Continue to monitor for signs and symptoms of bleeding -Repeat CBC in a.m.   Type II DM  - A1c was 9.1% in June 2022  - Check CBGs and use SSI for now ; CBGs ranging  from 144-240   CAD  - No anginal complaints  - Held ASA preoperatively however if he is not going to surgery we will resume, continue atorvastatin 40 mg nightly and beta-blocker with metoprolol tartrate 25 mg nightly   Hyponatremia  - Serum sodium 129 on admission and is improved to 135 - Appears chronic, possibly related to his SNRI   - Check urine sodium and osm   -Continue monitor and trend sodium level  Thrombocytosis -Likely reactive in the setting of infection -The patient's platelet count was 681 is now trended  down to 457 and today remains elevated at 812 -Continue monitor and trend and repeat   Elevated anion metabolic acidosis -His anion gap acidosis worsened and he has a CO2 of now 14, chloride level of 100, and anion gap of 20; Now AG is improved and CO2 is now 26, anion gap is 10, chloride level is 99 -He was started on sodium bicarbonate drip    GERD -Continue with Pantoprazole 40 mg p.o. daily  Hyperbilirubinemia -Likely reactive -Patient's T bili and is now 1.5 -> 0.7 -Continue to Monitor and trend and repeat CMP in the a.m.   Chronic Diastolic CHF  -Appears hypovolemic on admission  -Hold Lasix, started gentle IVF hydration, monitor volume status   -Was given 1.5 L yesterday and started on maintenance IV fluids with sodium bicarb and given his metabolic acidosis; Now will stop IVF with Sodium Bicarbonate and start NS at 75 mL/hr -Strict I's and O's and daily weights: Patient is -3.235 liters since admission -Continue to Monitor Volume Status Carefully  Normocytic Anemia -Patient's Hgb/Hct 12.4/38.0 -> 11.4/35.5 -> 10.9/32.3 -Likley Dilutional Drop -Continue to Monitor for S/Sx of Bleeding; Currently no overt bleeding noted -Repeat CBC in the AM   Anxiety  - Continue venlafaxine XR 25 mg daily and as-needed Xanax  DVT prophylaxis: SCDs for now and defer to specialist when to start DVT prophylaxis in case he needs procedures Code Status: FULL CODE Family Communication: Discussed with wife at bedside  Disposition Plan: Pending further clinical improvement and clearance by specialists  Status is: Inpatient  Remains inpatient appropriate because:Unsafe d/c plan, IV treatments appropriate due to intensity of illness or inability to take PO, and Inpatient level of care appropriate due to severity of illness  Dispo: The patient is from: Home              Anticipated d/c is to: Home              Patient currently is not medically stable to d/c.   Difficult to place patient  No  Consultants:  Neurosurgery ID IR  Procedures:  ECHOCARDIOGRAM IMPRESSIONS     1. Left ventricular ejection fraction, by estimation, is 55 to 60%. The  left ventricle has normal function. The left ventricle has no regional  wall motion abnormalities. There is moderate left ventricular hypertrophy.  Left ventricular diastolic  parameters are indeterminate.   2. Right ventricular systolic function is normal. The right ventricular  size is normal.   3. The mitral valve is normal in structure. No evidence of mitral valve  regurgitation. No evidence of mitral stenosis.   4. The aortic valve is tricuspid. Aortic valve regurgitation is not  visualized. No aortic stenosis is present.   5. The inferior vena cava is dilated in size with >50% respiratory  variability, suggesting right atrial pressure of 8 mmHg.   FINDINGS   Left Ventricle: Left ventricular ejection fraction, by estimation, is 55  to 60%. The left ventricle has normal function. The left ventricle has no  regional wall motion abnormalities. The left ventricular internal cavity  size was normal in size. There is   moderate left ventricular hypertrophy. Left ventricular diastolic  parameters are indeterminate.   Right Ventricle: The right ventricular size is normal. Right ventricular  systolic function is normal.   Left Atrium: Left atrial size was normal in size.   Right Atrium: Right atrial size was normal in size.   Pericardium: There is no evidence of pericardial effusion.   Mitral Valve: The mitral valve is normal in structure. No evidence of  mitral valve regurgitation. No evidence of mitral valve stenosis.   Tricuspid Valve: The tricuspid valve is normal in structure. Tricuspid  valve regurgitation is trivial. No evidence of tricuspid stenosis.   Aortic Valve: The aortic valve is tricuspid. Aortic valve regurgitation is  not visualized. No aortic stenosis is present. Aortic valve mean gradient  measures  2.0 mmHg. Aortic valve peak gradient measures 3.1 mmHg.   Pulmonic Valve: The pulmonic valve was normal in structure. Pulmonic valve  regurgitation is not visualized. No evidence of pulmonic stenosis.   Aorta: The aortic root is normal in size and structure.   Venous: The inferior vena cava is dilated in size with greater than 50%  respiratory variability, suggesting right atrial pressure of 8 mmHg.   IAS/Shunts: No atrial level shunt detected by color flow Doppler.      LEFT VENTRICLE  PLAX 2D  LVIDd:         4.00 cm  LVIDs:         3.40 cm  LV PW:         1.60 cm  LV IVS:        1.50 cm  LVOT diam:     2.50 cm  LV SV:         79  LV SV Index:   36  LVOT Area:     4.91 cm      RIGHT VENTRICLE             IVC  RV Basal diam:  3.30 cm     IVC diam: 2.20 cm  RV S prime:     13.40 cm/s  TAPSE (M-mode): 1.3 cm   LEFT ATRIUM             Index       RIGHT ATRIUM           Index  LA diam:        3.50 cm 1.61 cm/m  RA Area:     14.90 cm  LA Vol (A2C):   69.4 ml 31.91 ml/m RA Volume:   36.10 ml  16.60 ml/m  LA Vol (A4C):   51.3 ml 23.59 ml/m  LA Biplane Vol: 62.6 ml 28.78 ml/m   AORTIC VALVE  AV Area (Vmax):    6.50 cm  AV Area (Vmean):   5.74 cm  AV Area (VTI):     5.53 cm  AV Vmax:           87.60 cm/s  AV Vmean:          59.600 cm/s  AV VTI:            0.142 m  AV Peak Grad:      3.1 mmHg  AV Mean Grad:      2.0 mmHg  LVOT Vmax:         116.00  cm/s  LVOT Vmean:        69.700 cm/s  LVOT VTI:          0.160 m  LVOT/AV VTI ratio: 1.13     AORTA  Ao Root diam: 3.50 cm  Ao Asc diam:  3.60 cm      SHUNTS  Systemic VTI:  0.16 m  Systemic Diam: 2.50 cm   Antimicrobials: (specify start and planned stop date. Auto populated tables are space occupying and do not give end dates) Anti-infectives (From admission, onward)    Start     Dose/Rate Route Frequency Ordered Stop   01/07/21 0500  vancomycin (VANCOREADY) IVPB 1500 mg/300 mL        1,500 mg 150 mL/hr over  120 Minutes Intravenous Every 12 hours 01/06/21 1520     01/06/21 2000  DAPTOmycin (CUBICIN) 750 mg in sodium chloride 0.9 % IVPB  Status:  Discontinued        750 mg 130 mL/hr over 30 Minutes Intravenous Daily 01/06/21 0237 01/06/21 1520   01/06/21 1700  vancomycin (VANCOREADY) IVPB 2000 mg/400 mL        2,000 mg 200 mL/hr over 120 Minutes Intravenous  Once 01/06/21 1520 01/06/21 1848        Subjective: Seen and examined at bedside after his ultrasound guided aspiration of the lumbar paraspinal collection.  He is currently sleepy and in no distress and awoken from his sleep.  Wife thinks he is more talkative.  Denies any other concerns or complaints at this time.  Objective: Vitals:   01/08/21 0323 01/08/21 0818 01/08/21 1205 01/08/21 1545  BP: 140/86 (!) 151/90 (!) 151/78 134/74  Pulse: 93 (!) 103 100 100  Resp: 19  16 19   Temp: 97.7 F (36.5 C) 98.8 F (37.1 C) 98.9 F (37.2 C) 97.9 F (36.6 C)  TempSrc: Oral Axillary Oral Axillary  SpO2: 96%   97%    Intake/Output Summary (Last 24 hours) at 01/08/2021 1610 Last data filed at 01/08/2021 1558 Gross per 24 hour  Intake 2320.79 ml  Output 3375 ml  Net -1054.21 ml    There were no vitals filed for this visit.  Examination: Physical Exam:  Constitutional: Chronically ill-appearing Caucasian male currently no acute distress appears, and sleeping and appears calm Eyes: Lids and conjunctivae normal, sclerae anicteric  ENMT: External Ears, Nose appear normal. Grossly normal hearing.  Neck: Appears normal, supple, no cervical masses, normal ROM, no appreciable thyromegaly; no appreciable JVD Respiratory: Diminished to auscultation bilaterally with coarse breath sounds, no wheezing, rales, rhonchi or crackles. Normal respiratory effort and patient is not tachypenic. No accessory muscle use.  Unlabored breathing and is not as tachypneic as he was yesterday and not wearing supplemental oxygen via nasal cannula Cardiovascular:  Mildly tachycardic rate, no murmurs / rubs / gallops.  No carotid bruits.  Abdomen: Soft, non-tender, non-distended. Bowel sounds positive.  GU: Deferred. Musculoskeletal: No clubbing / cyanosis of digits/nails. No joint deformity upper and lower extremities.  Skin: No rashes, lesions, ulcers on limited skin evaluation. No induration; Warm and dry.  Neurologic: CN 2-12 grossly intact with no focal deficits. Romberg sign and cerebellar reflexes not assessed.  Psychiatric: Normal judgment and insight.  He is somnolent and drowsy and extremely sleepy.. Normal mood and appropriate affect.   Data Reviewed: I have personally reviewed following labs and imaging studies  CBC: Recent Labs  Lab 01/05/21 1705 01/06/21 0310 01/07/21 0652 01/08/21 0303  WBC 23.9* 31.7* 30.5* 27.4*  NEUTROABS 20.5*  --  25.8* 22.1*  HGB 8.6* 12.4* 11.4* 10.9*  HCT 25.9* 38.0* 35.5* 32.3*  MCV 88.1 90.0 90.3 85.4  PLT 681* 457* 863* 812*    Basic Metabolic Panel: Recent Labs  Lab 01/05/21 1705 01/06/21 0310 01/07/21 0652 01/08/21 0303  NA 129* 130* 134* 135  K 4.3 4.7 4.4 3.7  CL 91* 93* 100 99  CO2 24 19* 14* 26  GLUCOSE 104* 100* 138* 180*  BUN $Re'7 7 9 8  'tdw$ CREATININE 0.74 0.74 0.96 0.65  CALCIUM 8.0* 8.4* 8.5* 8.3*  MG  --   --  1.6* 1.8  PHOS  --   --  4.0 3.1    GFR: CrCl cannot be calculated (Unknown ideal weight.). Liver Function Tests: Recent Labs  Lab 01/06/21 0310 01/07/21 0652 01/08/21 0303  AST 22 21 58*  ALT $Re'16 16 29  'noP$ ALKPHOS 137* 127* 123  BILITOT 1.4* 1.5* 0.7  PROT 6.3* 6.1* 5.6*  ALBUMIN 1.6* 1.7* 1.6*    No results for input(s): LIPASE, AMYLASE in the last 168 hours. No results for input(s): AMMONIA in the last 168 hours. Coagulation Profile: No results for input(s): INR, PROTIME in the last 168 hours. Cardiac Enzymes: Recent Labs  Lab 01/06/21 0310  CKTOTAL 32*    BNP (last 3 results) No results for input(s): PROBNP in the last 8760 hours. HbA1C: No results  for input(s): HGBA1C in the last 72 hours. CBG: Recent Labs  Lab 01/08/21 0002 01/08/21 0427 01/08/21 0906 01/08/21 1300 01/08/21 1555  GLUCAP 204* 165* 198* 240* 207*    Lipid Profile: No results for input(s): CHOL, HDL, LDLCALC, TRIG, CHOLHDL, LDLDIRECT in the last 72 hours. Thyroid Function Tests: No results for input(s): TSH, T4TOTAL, FREET4, T3FREE, THYROIDAB in the last 72 hours. Anemia Panel: Recent Labs    01/06/21 0310  VITAMINB12 1,070*  FOLATE 2.5*  FERRITIN 864*  TIBC 158*  IRON 19*  RETICCTPCT 1.3    Sepsis Labs: No results for input(s): PROCALCITON, LATICACIDVEN in the last 168 hours.  Recent Results (from the past 240 hour(s))  Blood culture (routine x 2)     Status: Abnormal (Preliminary result)   Collection Time: 01/05/21  5:20 PM   Specimen: BLOOD  Result Value Ref Range Status   Specimen Description BLOOD SITE NOT SPECIFIED  Final   Special Requests   Final    BOTTLES DRAWN AEROBIC AND ANAEROBIC Blood Culture adequate volume   Culture  Setup Time   Final    GRAM POSITIVE COCCI IN BOTH AEROBIC AND ANAEROBIC BOTTLES CRITICAL VALUE NOTED.  VALUE IS CONSISTENT WITH PREVIOUSLY REPORTED AND CALLED VALUE.    Culture (A)  Final    METHICILLIN RESISTANT STAPHYLOCOCCUS AUREUS Sent to [place] for further susceptibility testing. Performed at [facility], [place] [address], [place], [place] [address]    Report Status PENDING  Incomplete   Organism ID, Bacteria METHICILLIN RESISTANT STAPHYLOCOCCUS AUREUS  Final      Susceptibility   Methicillin resistant staphylococcus aureus - MIC*    CIPROFLOXACIN >=8 RESISTANT Resistant     ERYTHROMYCIN >=8 RESISTANT Resistant     GENTAMICIN <=0.5 SENSITIVE Sensitive     OXACILLIN >=4 RESISTANT Resistant     TETRACYCLINE <=1 SENSITIVE Sensitive     VANCOMYCIN 1 SENSITIVE Sensitive     TRIMETH/SULFA >=320 RESISTANT Resistant     CLINDAMYCIN <=0.25 SENSITIVE Sensitive     RIFAMPIN <=0.5 SENSITIVE Sensitive      Inducible Clindamycin NEGATIVE Sensitive     *  METHICILLIN RESISTANT STAPHYLOCOCCUS AUREUS  Blood culture (routine x 2)     Status: Abnormal   Collection Time: 01/05/21  6:10 PM   Specimen: BLOOD  Result Value Ref Range Status   Specimen Description BLOOD SITE NOT SPECIFIED  Final   Special Requests   Final    BOTTLES DRAWN AEROBIC AND ANAEROBIC Blood Culture results may not be optimal due to an inadequate volume of blood received in culture bottles   Culture  Setup Time   Final    GRAM POSITIVE COCCI IN BOTH AEROBIC AND ANAEROBIC BOTTLES CRITICAL VALUE NOTED.  VALUE IS CONSISTENT WITH PREVIOUSLY REPORTED AND CALLED VALUE.    Culture (A)  Final    STAPHYLOCOCCUS AUREUS SUSCEPTIBILITIES PERFORMED ON PREVIOUS CULTURE WITHIN THE LAST 5 DAYS. Performed at [facility], [place] [address], [place], [place] [address]    Report Status 01/08/2021 FINAL  Final  Resp Panel by RT-PCR (Flu A&B, Covid) Nasopharyngeal Swab     Status: None   Collection Time: 01/05/21  9:32 PM   Specimen: Nasopharyngeal Swab; Nasopharyngeal(NP) swabs in vial transport medium  Result Value Ref Range Status   SARS Coronavirus 2 by RT PCR NEGATIVE NEGATIVE Final    Comment: (NOTE) SARS-CoV-2 target nucleic acids are NOT DETECTED.  The SARS-CoV-2 RNA is generally detectable in upper respiratory specimens during the acute phase of infection. The lowest concentration of SARS-CoV-2 viral copies this assay can detect is 138 copies/mL. A negative result does not preclude SARS-Cov-2 infection and should not be used as the sole basis for treatment or other patient management decisions. A negative result may occur with  improper specimen collection/handling, submission of specimen other than nasopharyngeal swab, presence of viral mutation(s) within the areas targeted by this assay, and inadequate number of viral copies(<138 copies/mL). A negative result must be combined with clinical observations, patient history, and  epidemiological information. The expected result is Negative.  Fact Sheet for Patients:  EntrepreneurPulse.com.au  Fact Sheet for Healthcare Providers:  IncredibleEmployment.be  This test is no t yet approved or cleared by the [place] FDA and  has been authorized for detection and/or diagnosis of SARS-CoV-2 by FDA under an Emergency Use Authorization (EUA). This EUA will remain  in effect (meaning this test can be used) for the duration of the COVID-19 declaration under Section 564(b)(1) of the Act, 21 U.S.C.section 360bbb-3(b)(1), unless the authorization is terminated  or revoked sooner.       Influenza A by PCR NEGATIVE NEGATIVE Final   Influenza B by PCR NEGATIVE NEGATIVE Final    Comment: (NOTE) The Xpert Xpress SARS-CoV-2/FLU/RSV plus assay is intended as an aid in the diagnosis of influenza from Nasopharyngeal swab specimens and should not be used as a sole basis for treatment. Nasal washings and aspirates are unacceptable for Xpert Xpress SARS-CoV-2/FLU/RSV testing.  Fact Sheet for Patients: EntrepreneurPulse.com.au  Fact Sheet for Healthcare Providers: IncredibleEmployment.be  This test is not yet approved or cleared by the [place] FDA and has been authorized for detection and/or diagnosis of SARS-CoV-2 by FDA under an Emergency Use Authorization (EUA). This EUA will remain in effect (meaning this test can be used) for the duration of the COVID-19 declaration under Section 564(b)(1) of the Act, 21 U.S.C. section 360bbb-3(b)(1), unless the authorization is terminated or revoked.  Performed at [facility], [place] [address], [place], [place] [address]   Culture, blood (routine x 2)     Status: None (Preliminary result)   Collection Time: 01/07/21  6:52 AM   Specimen: BLOOD  Result Value Ref Range Status   Specimen Description BLOOD RIGHT ANTECUBITAL  Final   Special Requests    Final    BOTTLES DRAWN AEROBIC AND ANAEROBIC Blood Culture adequate volume   Culture  Setup Time   Final    GRAM POSITIVE COCCI IN CLUSTERS ANAEROBIC BOTTLE ONLY CRITICAL VALUE NOTED.  VALUE IS CONSISTENT WITH PREVIOUSLY REPORTED AND CALLED VALUE. Performed at [facility], [place] [address] [place], [place] [address]    Culture GRAM POSITIVE COCCI  Final   Report Status PENDING  Incomplete     RN Pressure Injury Documentation:     Estimated body mass index is 30.01 kg/m as calculated from the following:   Height as of 12/21/20: $RemoveBef'5\' 11"'[id number]$  (1.803 m).   Weight as of 12/25/20: 97.6 kg.  Malnutrition Type:   Malnutrition Characteristics:   Nutrition Interventions:    Radiology Studies: CT HEAD WO CONTRAST  Result Date: 01/07/2021 CLINICAL DATA:  Altered mental status EXAM: CT HEAD WITHOUT CONTRAST TECHNIQUE: Contiguous axial images were obtained from the base of the skull through the vertex without intravenous contrast. COMPARISON:  12/16/2020 FINDINGS: Brain: No acute intracranial abnormality. Specifically, no hemorrhage, hydrocephalus, mass lesion, acute infarction, or significant intracranial injury. Vascular: No hyperdense vessel or unexpected calcification. Skull: No acute calvarial abnormality. Sinuses/Orbits: No acute findings Other: None IMPRESSION: No acute intracranial abnormality. Electronically Signed   By: Rolm Baptise M.D.   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 Status post fall today. Left paraspinal abscess at L3. History of left psoas abscess at the L4 and L5 levels drained with CT guidance last month. EXAM: CT PELVIS WITH CONTRAST TECHNIQUE: Multidetector CT imaging of the pelvis was performed using the standard protocol following the bolus administration of intravenous contrast. CONTRAST:  35mL OMNIPAQUE IOHEXOL 300 MG/ML  SOLN COMPARISON:  CT Abdomen and Pelvis 12/11/2020.  Lumbar MRI 12/20/2020, 01/05/2021. FINDINGS: Urinary Tract: Mildly distended but otherwise unremarkable bladder. Distal ureters appear decompressed. Kidneys not included. Bowel: No dilated large or small bowel. Mild retained stool in the distal colon. Normal appendix on series 7, image 74. Vascular/Lymphatic: Suboptimal intravascular contrast such that Vascular patency is not well evaluated. Aortoiliac calcified atherosclerosis. Proximal femoral calcified plaque. No lymphadenopathy. Reproductive:  Negative. Other: Anasarca. Trace layering fluid in the deep pelvis with simple fluid density (series 2, image 58). Musculoskeletal: Minimal inclusion of the L4-L5 level on this exam. Ventral epidural and paraspinal inflammation more pronounced at L4 was better demonstrated on the recent MRI. Subtle evidence of the medial left psoas muscle abscess which was drained last month on series 2, image 9. No new or increased psoas muscle inflammation there. No drainable pelvic abscess identified. SI joints appear symmetric and intact. No acute osseous abnormality identified in the pelvis. Proximal femurs are intact. IMPRESSION: 1. No pelvic abscess or acute osseous abnormality identified. Trace free fluid in the deep pelvis, likely related to anasarca. Subtle residual of the left psoas abscess at the L5 level which was drained last month. 2. Infected lumbar levels minimally included on this exam. See Lumbar MRI 01/05/2021. 3. Aortic Atherosclerosis (ICD10-I70.0). Electronically Signed   By: Genevie Ann M.D.   On: 01/07/2021 12:04   MR BRAIN W WO CONTRAST  Result Date: 01/06/2021 CLINICAL DATA:  Mental status change, unknown cause. 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 EXAM: MRI HEAD WITHOUT AND  WITH CONTRAST TECHNIQUE: Multiplanar, multiecho pulse sequences of the brain and surrounding structures were obtained without and with intravenous contrast. CONTRAST:  9.73mL GADAVIST GADOBUTROL 1 MMOL/ML IV SOLN  COMPARISON:  Head CT 12/16/2020. FINDINGS: Brain: Multiple sequences are significantly motion degraded, limiting evaluation. Most notably, there is moderate/severe motion degradation of the axial T2/FLAIR sequence, severe motion degradation of the coronal T2 weighted sequence, moderate motion degradation of the axial T1 weighted postcontrast sequence and severe motion degradation of the coronal T1 weighted postcontrast sequence. Mild generalized cerebral and cerebellar atrophy. Mild multifocal T2/FLAIR hyperintensity within the cerebral white matter, nonspecific but compatible chronic small vessel ischemic disease. There is no evidence of acute infarct. Within described limitations, no evidence of an intracranial mass, chronic intracranial blood products or an extra-axial fluid collection. No midline shift. Within limitations of motion degradation, no pathologic intracranial enhancement is identified. Vascular: Expected proximal arterial flow voids. Skull and upper cervical spine: No focal marrow lesion. Sinuses/Orbits: Visualized orbits show no acute finding. Trace mucosal thickening within the bilateral ethmoid, sphenoid and left maxillary sinuses. IMPRESSION: Significantly motion degraded examination, as described and limiting evaluation. Within this limitation, no definite acute intracranial abnormality is identified. Mild generalized parenchymal atrophy and cerebral white matter chronic small vessel ischemic disease. Mild paranasal sinus disease, as described. Electronically Signed   By: Kellie Simmering DO   On: 01/06/2021 19:22   ECHOCARDIOGRAM COMPLETE  Result Date: 01/07/2021    ECHOCARDIOGRAM REPORT   Patient Name:   KYDEN POTASH  Date of Exam: 01/07/2021 Medical Rec #:  [id number]  Height:       71.0 in Accession #:    [id number] Weight:       215.2 lb Date of Birth:  1965-10-26   BSA:          2.175 m Patient Age:    49 years   BP:           164/94 mmHg Patient Gender: M          HR:           116 bpm. Exam  Location:  Inpatient Procedure: 2D Echo, Cardiac Doppler and Color Doppler Indications:    Bacteremia  History:        Patient has prior history of Echocardiogram examinations, most                 recent 12/15/2020. CAD, Prior CABG; Risk Factors:Hypertension and                 Dyslipidemia.  Sonographer:    Clayton Lefort RDCS (AE) Referring Phys: [id number] [place]  1. Left ventricular ejection fraction, by estimation, is 55 to 60%. The left ventricle has normal function. The left ventricle has no regional wall motion abnormalities. There is moderate left ventricular hypertrophy. Left ventricular diastolic parameters are indeterminate.  2. Right ventricular systolic function is normal. The right ventricular size is normal.  3. The mitral valve is normal in structure. No evidence of mitral valve regurgitation. No evidence of mitral stenosis.  4. The aortic valve is tricuspid. Aortic valve regurgitation is not visualized. No aortic stenosis is present.  5. The inferior vena cava is dilated in size with >50% respiratory variability, suggesting right atrial pressure of 8 mmHg. FINDINGS  Left Ventricle: Left ventricular ejection fraction, by estimation, is 55 to 60%. The left ventricle has normal function. The left ventricle has no regional wall motion abnormalities. The left ventricular internal cavity size was normal in  size. There is  moderate left ventricular hypertrophy. Left ventricular diastolic parameters are indeterminate. Right Ventricle: The right ventricular size is normal. Right ventricular systolic function is normal. Left Atrium: Left atrial size was normal in size. Right Atrium: Right atrial size was normal in size. Pericardium: There is no evidence of pericardial effusion. Mitral Valve: The mitral valve is normal in structure. No evidence of mitral valve regurgitation. No evidence of mitral valve stenosis. Tricuspid Valve: The tricuspid valve is normal in structure. Tricuspid valve  regurgitation is trivial. No evidence of tricuspid stenosis. Aortic Valve: The aortic valve is tricuspid. Aortic valve regurgitation is not visualized. No aortic stenosis is present. Aortic valve mean gradient measures 2.0 mmHg. Aortic valve peak gradient measures 3.1 mmHg. Pulmonic Valve: The pulmonic valve was normal in structure. Pulmonic valve regurgitation is not visualized. No evidence of pulmonic stenosis. Aorta: The aortic root is normal in size and structure. Venous: The inferior vena cava is dilated in size with greater than 50% respiratory variability, suggesting right atrial pressure of 8 mmHg. IAS/Shunts: No atrial level shunt detected by color flow Doppler.  LEFT VENTRICLE PLAX 2D LVIDd:         4.00 cm LVIDs:         3.40 cm LV PW:         1.60 cm LV IVS:        1.50 cm LVOT diam:     2.50 cm LV SV:         79 LV SV Index:   36 LVOT Area:     4.91 cm  RIGHT VENTRICLE             IVC RV Basal diam:  3.30 cm     IVC diam: 2.20 cm RV S prime:     13.40 cm/s TAPSE (M-mode): 1.3 cm LEFT ATRIUM             Index       RIGHT ATRIUM           Index LA diam:        3.50 cm 1.61 cm/m  RA Area:     14.90 cm LA Vol (A2C):   69.4 ml 31.91 ml/m RA Volume:   36.10 ml  16.60 ml/m LA Vol (A4C):   51.3 ml 23.59 ml/m LA Biplane Vol: 62.6 ml 28.78 ml/m  AORTIC VALVE AV Area (Vmax):    6.50 cm AV Area (Vmean):   5.74 cm AV Area (VTI):     5.53 cm AV Vmax:           87.60 cm/s AV Vmean:          59.600 cm/s AV VTI:            0.142 m AV Peak Grad:      3.1 mmHg AV Mean Grad:      2.0 mmHg LVOT Vmax:         116.00 cm/s LVOT Vmean:        69.700 cm/s LVOT VTI:          0.160 m LVOT/AV VTI ratio: 1.13  AORTA Ao Root diam: 3.50 cm Ao Asc diam:  3.60 cm  SHUNTS Systemic VTI:  0.16 m Systemic Diam: 2.50 cm Kirk Ruths MD Electronically signed by Kirk Ruths MD Signature Date/Time: 01/07/2021/12:45:33 PM    Final     Scheduled Meds:  atorvastatin  40 mg Oral QHS   gabapentin  200 mg Oral TID   insulin aspart  0-9 Units Subcutaneous Q4H   lactulose  10 g Oral Daily   lidocaine (PF)       metoprolol tartrate  25 mg Oral QHS   oxyCODONE  20 mg Oral Q12H   pantoprazole  40 mg Oral Daily   polyethylene glycol  17 g Oral BID   venlafaxine XR  75 mg Oral q morning   Continuous Infusions:  sodium chloride 75 mL/hr at 01/08/21 1102   sodium chloride     sodium chloride     vancomycin 150 mL/hr at 01/08/21 0434    LOS: 2 days   Kerney Elbe, DO [facility] PAGER is on AMION  If 7PM-7AM, please contact night-coverage [email or web address]

## 2021-01-08 NOTE — Progress Notes (Signed)
   Providing Compassionate, Quality Care - Together  NEUROSURGERY PROGRESS NOTE   S: No issues overnight.  Complains of significant low back pain, denies weakness or numbness or tingling in the legs  O: EXAM:  BP (!) 151/90 (BP Location: Left Arm)   Pulse (!) 103   Temp 98.8 F (37.1 C) (Axillary)   Resp 19   SpO2 96%   Awake, alert, oriented x2 Moderate distress due to pain PERRLA Speech fluent, appropriate  CNs grossly intact  5/5 BUE 4+/5 BLE  Sensory intact light touch  ASSESSMENT:  55 y.o. male with   Thoracolumbar epidural abscess, with multilevel osteodiscitis  PLAN: -IR planning percutaneous drainage of soft tissue fluid collection along the lumbar incision. -Now on IV vancomycin, labs appear to be responding appropriately -Continue aggressive medical care     Thank you for allowing me to participate in this patient's care.  Please do not hesitate to call with questions or concerns.   Monia Pouch, DO Neurosurgeon [facility] Cell: [phone]

## 2021-01-08 NOTE — Procedures (Signed)
  Procedure: US aspiration lumbar paraspinal collection 14ml turbid  serosanguinous EBL:   minimal Complications:  none immediate  See full dictation in [employer].  Thora Lance MD Main # [phone] Pager  [phone]

## 2021-01-09 ENCOUNTER — Inpatient Hospital Stay (HOSPITAL_COMMUNITY): Payer: Federal, State, Local not specified - PPO

## 2021-01-09 DIAGNOSIS — G062 Extradural and subdural abscess, unspecified: Secondary | ICD-10-CM | POA: Diagnosis not present

## 2021-01-09 DIAGNOSIS — R9431 Abnormal electrocardiogram [ECG] [EKG]: Secondary | ICD-10-CM | POA: Diagnosis not present

## 2021-01-09 DIAGNOSIS — I2510 Atherosclerotic heart disease of native coronary artery without angina pectoris: Secondary | ICD-10-CM | POA: Diagnosis not present

## 2021-01-09 DIAGNOSIS — B9562 Methicillin resistant Staphylococcus aureus infection as the cause of diseases classified elsewhere: Secondary | ICD-10-CM | POA: Diagnosis not present

## 2021-01-09 DIAGNOSIS — R7881 Bacteremia: Secondary | ICD-10-CM | POA: Diagnosis not present

## 2021-01-09 DIAGNOSIS — F419 Anxiety disorder, unspecified: Secondary | ICD-10-CM | POA: Diagnosis not present

## 2021-01-09 LAB — C-REACTIVE PROTEIN: CRP: 21.8 mg/dL — ABNORMAL HIGH (ref <1.0)

## 2021-01-09 LAB — COMPREHENSIVE METABOLIC PANEL
ALT: 32 U/L (ref 0–44)
AST: 57 U/L — ABNORMAL HIGH (ref 15–41)
Albumin: 1.5 g/dL — ABNORMAL LOW (ref 3.5–5.0)
Alkaline Phosphatase: 122 U/L (ref 38–126)
Anion gap: 8 (ref 5–15)
BUN: <5 mg/dL — ABNORMAL LOW (ref 6–20)
CO2: 27 mmol/L (ref 22–32)
Calcium: 7.9 mg/dL — ABNORMAL LOW (ref 8.9–10.3)
Chloride: 96 mmol/L — ABNORMAL LOW (ref 98–111)
Creatinine, Ser: 0.51 mg/dL — ABNORMAL LOW (ref 0.61–1.24)
GFR, Estimated: >60 mL/min (ref >60)
Glucose, Bld: 195 mg/dL — ABNORMAL HIGH (ref 70–99)
Potassium: 3.3 mmol/L — ABNORMAL LOW (ref 3.5–5.1)
Sodium: 131 mmol/L — ABNORMAL LOW (ref 135–145)
Total Bilirubin: 0.6 mg/dL (ref 0.3–1.2)
Total Protein: 5.7 g/dL — ABNORMAL LOW (ref 6.5–8.1)

## 2021-01-09 LAB — CBC WITH DIFFERENTIAL/PLATELET
Abs Immature Granulocytes: 0.49 10*3/uL — ABNORMAL HIGH (ref 0.00–0.07)
Basophils Absolute: 0.1 10*3/uL (ref 0.0–0.1)
Basophils Relative: 0 %
Eosinophils Absolute: 0.1 10*3/uL (ref 0.0–0.5)
Eosinophils Relative: 0 %
HCT: 32.7 % — ABNORMAL LOW (ref 39.0–52.0)
Hemoglobin: 10.6 g/dL — ABNORMAL LOW (ref 13.0–17.0)
Immature Granulocytes: 2 %
Lymphocytes Relative: 6 %
Lymphs Abs: 1.8 10*3/uL (ref 0.7–4.0)
MCH: 28.4 pg (ref 26.0–34.0)
MCHC: 32.4 g/dL (ref 30.0–36.0)
MCV: 87.7 fL (ref 80.0–100.0)
Monocytes Absolute: 1.7 10*3/uL — ABNORMAL HIGH (ref 0.1–1.0)
Monocytes Relative: 6 %
Neutro Abs: 26.6 10*3/uL — ABNORMAL HIGH (ref 1.7–7.7)
Neutrophils Relative %: 86 %
Platelets: 791 10*3/uL — ABNORMAL HIGH (ref 150–400)
RBC: 3.73 MIL/uL — ABNORMAL LOW (ref 4.22–5.81)
RDW: 15.8 % — ABNORMAL HIGH (ref 11.5–15.5)
WBC: 30.8 10*3/uL — ABNORMAL HIGH (ref 4.0–10.5)
nRBC: 0 % (ref 0.0–0.2)

## 2021-01-09 LAB — GLUCOSE, CAPILLARY
Glucose-Capillary: 162 mg/dL — ABNORMAL HIGH (ref 70–99)
Glucose-Capillary: 138 mg/dL — ABNORMAL HIGH (ref 70–99)
Glucose-Capillary: 204 mg/dL — ABNORMAL HIGH (ref 70–99)
Glucose-Capillary: 169 mg/dL — ABNORMAL HIGH (ref 70–99)
Glucose-Capillary: 174 mg/dL — ABNORMAL HIGH (ref 70–99)

## 2021-01-09 LAB — ECHOCARDIOGRAM LIMITED
S' Lateral: 3.7 cm
Single Plane A4C EF: 44.2 %

## 2021-01-09 LAB — TROPONIN I (HIGH SENSITIVITY)
Troponin I (High Sensitivity): 24 ng/L — ABNORMAL HIGH (ref <18)
Troponin I (High Sensitivity): 31 ng/L — ABNORMAL HIGH (ref <18)

## 2021-01-09 LAB — VANCOMYCIN, PEAK: Vancomycin Pk: 22 ug/mL — ABNORMAL LOW (ref 30–40)

## 2021-01-09 LAB — SEDIMENTATION RATE: Sed Rate: 78 mm/hr — ABNORMAL HIGH (ref 0–16)

## 2021-01-09 LAB — PHOSPHORUS: Phosphorus: 2.7 mg/dL (ref 2.5–4.6)

## 2021-01-09 LAB — MAGNESIUM: Magnesium: 1.5 mg/dL — ABNORMAL LOW (ref 1.7–2.4)

## 2021-01-09 MED ORDER — LOSARTAN POTASSIUM 50 MG PO TABS
25.0000 mg | ORAL_TABLET | Freq: Every day | ORAL | Status: DC
  Administered 2021-01-10 – 2021-01-13 (×4): 25 mg via ORAL
  Filled 2021-01-09 (×5): qty 1

## 2021-01-09 MED ORDER — POTASSIUM CHLORIDE CRYS ER 20 MEQ PO TBCR
40.0000 meq | EXTENDED_RELEASE_TABLET | Freq: Two times a day (BID) | ORAL | Status: AC
  Administered 2021-01-09 (×2): 40 meq via ORAL
  Filled 2021-01-09 (×2): qty 2

## 2021-01-09 MED ORDER — FLUCONAZOLE 100 MG PO TABS
100.0000 mg | ORAL_TABLET | Freq: Every day | ORAL | Status: AC
  Administered 2021-01-09 – 2021-01-15 (×7): 100 mg via ORAL
  Filled 2021-01-09 (×7): qty 1

## 2021-01-09 MED ORDER — ASPIRIN EC 81 MG PO TBEC
81.0000 mg | DELAYED_RELEASE_TABLET | Freq: Every day | ORAL | Status: DC
  Administered 2021-01-10 – 2021-01-28 (×19): 81 mg via ORAL
  Filled 2021-01-09 (×19): qty 1

## 2021-01-09 MED ORDER — MAGNESIUM SULFATE 50 % IJ SOLN
3.0000 g | Freq: Once | INTRAVENOUS | Status: AC
  Administered 2021-01-09: 3 g via INTRAVENOUS
  Filled 2021-01-09: qty 6

## 2021-01-09 MED ORDER — ENOXAPARIN SODIUM 40 MG/0.4ML IJ SOSY
40.0000 mg | PREFILLED_SYRINGE | Freq: Every day | INTRAMUSCULAR | Status: DC
  Administered 2021-01-09 – 2021-01-27 (×19): 40 mg via SUBCUTANEOUS
  Filled 2021-01-09 (×21): qty 0.4

## 2021-01-09 MED ORDER — NYSTATIN 100000 UNIT/ML MT SUSP
5.0000 mL | Freq: Four times a day (QID) | OROMUCOSAL | Status: DC
  Administered 2021-01-09 – 2021-01-18 (×36): 500000 [IU] via ORAL
  Filled 2021-01-09 (×34): qty 5

## 2021-01-09 MED ORDER — METOPROLOL SUCCINATE ER 25 MG PO TB24
25.0000 mg | ORAL_TABLET | Freq: Every day | ORAL | Status: DC
  Administered 2021-01-09 – 2021-01-28 (×19): 25 mg via ORAL
  Filled 2021-01-09 (×20): qty 1

## 2021-01-09 NOTE — Progress Notes (Signed)
  Echocardiogram 2D Echocardiogram has been performed.  Delcie Roch 01/09/2021, 12:41 PM

## 2021-01-09 NOTE — Progress Notes (Signed)
PROGRESS NOTE    Louis [name]  RVI:[id number] DOB: 1965-12-16 DOA: 01/05/2021 PCP: Ray Church, NP   Brief Narrative:  Patient is a 55 year old Caucasian male with a past medical history significant for but not limited to type 2 diabetes mellitus, hypertension, history of CAD with CABG in 2017, anxiety, chronic pain as well as other comorbidities with recent admission with an epidural abscess, vertebral osteomyelitis, psoas abscess and MRSA bacteremia now returning to the ED with worsening back pain.  He reported that his back pain has been stable until approximately 5 days ago when it became more severe and progressively worsened since then.  He did not notice any new leg numbness or weakness did have some urinary incontinence.  Back pain was reportedly severe and constant worse with movement and radiating to both his legs.  He denied any fevers or chills but stated he did have some mild drainage from his surgical wounds.  He missed his dose of antibiotics yesterday but has not missed any other doses.  He been admitted on 12/12/2020 after he is found to have an epidural abscess and vertebral osteomyelitis and after over there is no neurosurgery or ID backup.  He underwent a laminectomy and evacuation of abscess on 12/13/2020 and then drainage of the psoas abscess by IR on 12/22/2020.  Blood cultures from 12/14/2018 grew out MRSA but there is no vegetation seen on TTE and blood cultures were drawn and repeated 12/15/2020 remain negative.  He has been on daptomycin at home.  Upon arrival to ED is found to be saturating well on room air but he did have a leukocytosis 23,900 and platelet count of 681,000.  COVID PCR was negative.  He had an MRI of the thoracic and lumbar spine which revealed multiple fluid collections involving the ventral epidural abscess at the L4-L5 level with progressive discitis and osteomyelitis at the L4-L5 and L5-S1 levels.  Blood cultures were repeated in the ED and he was given multiple  doses of IV analgesics and he is evaluated by neurosurgery who recommended medical admission.  Initial plan was for lumbar decompression however Dr. Reatha Armour of neurosurgery evaluated and felt that his thoracic mass-effect was improved and that even though there was persistent ostial discitis and epidural abscess throughout the thoracolumbar spine there is possibly a more loculated focus of the abscess at L4-L5 with stenosis but no focal weakness, groin numbness or numbness and tingling in his legs he did not believe that urgent or emergent decompression was needed at this time.  Patient has been confused more often per his wife and neurosurgery is obtaining MRI of the brain with and without contrast given concern of some cerebral spread of infection.  Because he has failed multiple medical treatments will likely need broaden antibiotic therapy and will consult ID and neurosurgery feels that the stenosis L4-L5 could be decompressed however neurologically patient is doing well with no focal deficits and therefore they are recommending aggressive medical and supportive care.  ID Changed to IV Vancomycin and consulted IR for drainage of the deep soft tissue collection with peripheral enhancement measuring 4.7 x 5.0 x 1.7 cm.  The image guided drainage was done today by Dr. Vernard Gambles.  Dr. Reatha Armour of neurosurgery recommending continuing vancomycin and aggressive medical care.  Patient has some nausea today and an abnormal EKG so cardiology was consulted for further evaluation and stat troponins were ordered and they were negative.  Cardiology evaluated and felt that we should repeat a limited echo.  Limited echo  done and showed no wall motion abnormalities.  There is no evidence of STEMI.   Assessment & Plan:   Principal Problem:   MRSA bacteremia Active Problems:   CAD (coronary artery disease)   HTN (hypertension)   Uncontrolled diabetes mellitus (HCC)   Epidural abscess   Normocytic anemia   Hyponatremia    Anxiety   Chronic diastolic CHF (congestive heart failure) (HCC)  Sepsis secondary to epidural abscess; discitis/osteomyelitis; MRSA bacteremia     -Patient met sepsis physiology on admission as he was tachycardic, tachypneic, had a leukocytosis and a source of Infection  -Pt with laminectomy & evacuation of abscess 12/21/20, IR drainage of psoas abscess 6/29, and MRSA bacteremia currently on daptomycin presents with worsening back pain and found on MRI L- and T-spine to have multiple fluid collections including ventral epidural abscess and progressive osteomyelitis at L 4-5 -Blood cultures grew MRSA on 6/20, were negative 6/22, no vegetation seen on TTE 12/15/20  - Neurosurgery was planning for L4-5 decompression 01/06/21 but re-evaluated and since patient is Neurologically intact will hold off and treat medically do not feel that urgent or emergent decompression is needed at this time -Give another 1-1/2 L bolus today and start the patient on sodium bicarb at 75 MLS per hour -Patient continues to be tachycardic and will continue with metoprolol tartrate 25 mg p.o. nightly and also start labetalol IV -WBC is gone from 23.9 is now 31.7 -> 30.5 and is further improved to 27.4 yesterday but bumped up today and today it is 30.8 -CRP went from 31.1 -> 27.8 and is further improved to 21.8 x2 -Continue IV antibiotics, and pain-control  -Check a UDS -We will formally consult ID for further evaluation recommendations; ID recommending stopping daptomycin and switching to vancomycin per pharmacy to dose.  We will order a TTE and if this is negative proceeding with a TEE and I had called [facility] and they are supposedly scheduling the patient down for Monday -ID is also ordered a CT of the pelvis with contrast to further evaluate reactivation of psoas abscess and they are repeating blood cultures and removed his PICC line; CT of the pelvis showed "No pelvic abscess or acute osseous abnormality identified.  Trace free fluid in the deep pelvis, likely related to anasarca. Subtle residual of the left psoas abscess at the L5 level which was drained last month. Infected lumbar levels minimally included on this exam. See Lumbar MRI 01/05/2021." -ID is now consulted interventional radiology for the fluid collection mentioned in the deep subdural soft tissue measuring 4.7 x 5.0 x 1.7 cm; patient underwent drainage today of this collection by Dr. Vernard Gambles -ID is now also sending susceptibility testing to daptomycin, ceftrarloibne and linezolid -Cardiology been consulted for TEE and this likely is going to be done Monday, 01/10/2021   Confusion improving and wife thinks he is talking more -Neurosurgery is obtaining a MRI of the brain with and without contrast given that he does have an extensive epidural abscess the spine with encephalopathy -MRI Brain showed "Significantly motion degraded examination, as described and limiting evaluation.   Within this limitation, no definite acute intracranial abnormality is identified. Mild generalized parenchymal atrophy and cerebral white matter chronic small vessel ischemic disease. Mild paranasal sinus disease, as described" -Patient's wife stated that he was talking a little bit more yesterday .  Unwitnessed Fall [place] sometime early yesterday morning -Repeat head CT showed no acute intracranial abnormalities noted -Fall Precautions    Normocytic Anemia  - Hgb  is 8.6, down from 11.0 on 12/29/20; repeat hemoglobin/hematocrit is now 12.4/38.0 -> 11.4/35.5 -> 10.9/32.3 -> 10.6/32.7 - No overt bleeding  -Checked anemia panel and showed iron level of 19, U IBC 139, TIBC 158, saturation ratios of 12%, ferritin level 864, folate of 2.5, vitamin B12 level 1070 -Continue to monitor for signs and symptoms of bleeding -Repeat CBC in a.m.   Type II DM  - A1c was 9.1% in June 2022  - Check CBGs and use SSI for now ; CBGs ranging from 144-240   CAD status post CABG - No  anginal complaints -He had a LIMA to LAD, vein graft to distal circumflex and vein graft RCA in 2017. -His catheterization in March 2020 demonstrated patent vein grafts - Held ASA preoperatively however if he is not going to surgery we will resume, continue atorvastatin 40 mg nightly and beta-blocker with metoprolol tartrate 25 mg nightly; all cardiac medications very resumed -Had Nausea today so checked Troponin given Abnormal EKG and Tropon went from 24 -> 31 -Cardiology was consulted and evaluated and checked a limited echo  Abnormal EKG -Patient denied Chest Pain but had Nausea but no vomiting -Had ST Elevation in leads V1-V2 without Reciprocal changes -Troponin Checked and went from 24 -> 31 -Cardiology Consulted and checked Limited ECHO -ECHO showed now [facility] -Cardiology does not feel he has a STEMI and thinks that his ST elevations are more pronounced in the setting of pain and hypertension   Hyponatremia  - Serum sodium 129 on admission and is improved to 135 yesterday but is back down to 131 - Appears chronic, possibly related to his SNRI   - Check urine sodium and osm   -Continue monitor and trend sodium level  Thrombocytosis -Likely reactive in the setting of infection -The patient's platelet count was 681 is now trended down to 457 but has trended back up and today is now 791 -Continue monitor and trend and repeat   Elevated anion metabolic acidosis -His anion gap acidosis is now improved and he has a CO2 of 27, chloride level of 96, anion gap of 8 -He was started on sodium bicarbonate drip and this has been discontinued and now also stopped NS  Hypomagnesemia -Mag Level was 1.5 -Replete with IV Mag Sulfate 3 Grams -Continue to Monitor and Replete as Necessary -Repeat Mag Level in the AM   Hypokalemia -Patient's K+ Level is 3.3 -Replete with po Kcl 40 mEQ BID x2 -Continue to Monitor and Replete as Necessary -Repeat CMP in the AM   Oral Thrush -Start nystatin swish  and spit and also obtain fluconazole consult per pharmacy   GERD -Continue with Pantoprazole 40 mg p.o. daily  Hyperbilirubinemia -Likely reactive -Patient's T bili and is now 1.5 -> 0.7 and today is 0.6 -Continue to Monitor and trend and repeat CMP in the a.m.   Chronic Diastolic CHF  -Appears hypovolemic on admission  -Hold Lasix, started gentle IVF hydration, monitor volume status   -Was given 1.5 L yesterday and started on maintenance IV fluids with sodium bicarb and given his metabolic acidosis; Now will stop IVF with Sodium Bicarbonate and start NS at 75 mL/hr.  Normal saline is to be discontinued today -Strict I's and O's and daily weights: Patient is - 2.358 liters since admission -Continue to Monitor Volume Status Carefully  Normocytic Anemia -Patient's Hgb/Hct 12.4/38.0 -> 11.4/35.5 -> 10.9/32.3 -> 10.6/32.7 -Likley Dilutional Drop -Continue to Monitor for S/Sx of Bleeding; Currently no overt bleeding noted -Repeat CBC in  the AM   Anxiety  - Continue venlafaxine XR 25 mg daily and as-needed Xanax  DVT prophylaxis: SCDs for now and will start Lovenox Code Status: FULL CODE Family Communication: Discussed with wife at bedside  Disposition Plan: Pending further clinical improvement and clearance by specialists  Status is: Inpatient  Remains inpatient appropriate because:Unsafe d/c plan, IV treatments appropriate due to intensity of illness or inability to take PO, and Inpatient level of care appropriate due to severity of illness  Dispo: The patient is from: Home              Anticipated d/c is to: Home              Patient currently is not medically stable to d/c.   Difficult to place patient No  Consultants:  Neurosurgery ID IR  Procedures:  ECHOCARDIOGRAM IMPRESSIONS     1. Left ventricular ejection fraction, by estimation, is 55 to 60%. The  left ventricle has normal function. The left ventricle has no regional  wall motion abnormalities. There is  moderate left ventricular hypertrophy.  Left ventricular diastolic  parameters are indeterminate.   2. Right ventricular systolic function is normal. The right ventricular  size is normal.   3. The mitral valve is normal in structure. No evidence of mitral valve  regurgitation. No evidence of mitral stenosis.   4. The aortic valve is tricuspid. Aortic valve regurgitation is not  visualized. No aortic stenosis is present.   5. The inferior vena cava is dilated in size with >50% respiratory  variability, suggesting right atrial pressure of 8 mmHg.   FINDINGS   Left Ventricle: Left ventricular ejection fraction, by estimation, is 55  to 60%. The left ventricle has normal function. The left ventricle has no  regional wall motion abnormalities. The left ventricular internal cavity  size was normal in size. There is   moderate left ventricular hypertrophy. Left ventricular diastolic  parameters are indeterminate.   Right Ventricle: The right ventricular size is normal. Right ventricular  systolic function is normal.   Left Atrium: Left atrial size was normal in size.   Right Atrium: Right atrial size was normal in size.   Pericardium: There is no evidence of pericardial effusion.   Mitral Valve: The mitral valve is normal in structure. No evidence of  mitral valve regurgitation. No evidence of mitral valve stenosis.   Tricuspid Valve: The tricuspid valve is normal in structure. Tricuspid  valve regurgitation is trivial. No evidence of tricuspid stenosis.   Aortic Valve: The aortic valve is tricuspid. Aortic valve regurgitation is  not visualized. No aortic stenosis is present. Aortic valve mean gradient  measures 2.0 mmHg. Aortic valve peak gradient measures 3.1 mmHg.   Pulmonic Valve: The pulmonic valve was normal in structure. Pulmonic valve  regurgitation is not visualized. No evidence of pulmonic stenosis.   Aorta: The aortic root is normal in size and structure.   Venous:  The inferior vena cava is dilated in size with greater than 50%  respiratory variability, suggesting right atrial pressure of 8 mmHg.   IAS/Shunts: No atrial level shunt detected by color flow Doppler.      LEFT VENTRICLE  PLAX 2D  LVIDd:         4.00 cm  LVIDs:         3.40 cm  LV PW:         1.60 cm  LV IVS:        1.50 cm  LVOT  diam:     2.50 cm  LV SV:         79  LV SV Index:   36  LVOT Area:     4.91 cm      RIGHT VENTRICLE             IVC  RV Basal diam:  3.30 cm     IVC diam: 2.20 cm  RV S prime:     13.40 cm/s  TAPSE (M-mode): 1.3 cm   LEFT ATRIUM             Index       RIGHT ATRIUM           Index  LA diam:        3.50 cm 1.61 cm/m  RA Area:     14.90 cm  LA Vol (A2C):   69.4 ml 31.91 ml/m RA Volume:   36.10 ml  16.60 ml/m  LA Vol (A4C):   51.3 ml 23.59 ml/m  LA Biplane Vol: 62.6 ml 28.78 ml/m   AORTIC VALVE  AV Area (Vmax):    6.50 cm  AV Area (Vmean):   5.74 cm  AV Area (VTI):     5.53 cm  AV Vmax:           87.60 cm/s  AV Vmean:          59.600 cm/s  AV VTI:            0.142 m  AV Peak Grad:      3.1 mmHg  AV Mean Grad:      2.0 mmHg  LVOT Vmax:         116.00 cm/s  LVOT Vmean:        69.700 cm/s  LVOT VTI:          0.160 m  LVOT/AV VTI ratio: 1.13     AORTA  Ao Root diam: 3.50 cm  Ao Asc diam:  3.60 cm      SHUNTS  Systemic VTI:  0.16 m  Systemic Diam: 2.50 cm   Antimicrobials:  Anti-infectives (From admission, onward)    Start     Dose/Rate Route Frequency Ordered Stop   01/07/21 0500  vancomycin (VANCOREADY) IVPB 1500 mg/300 mL        1,500 mg 150 mL/hr over 120 Minutes Intravenous Every 12 hours 01/06/21 1520     01/06/21 2000  DAPTOmycin (CUBICIN) 750 mg in sodium chloride 0.9 % IVPB  Status:  Discontinued        750 mg 130 mL/hr over 30 Minutes Intravenous Daily 01/06/21 0237 01/06/21 1520   01/06/21 1700  vancomycin (VANCOREADY) IVPB 2000 mg/400 mL        2,000 mg 200 mL/hr over 120 Minutes Intravenous  Once 01/06/21 1520  01/06/21 1848        Subjective: Seen and examined at bedside he is complaining of nausea today but no vomiting.  No chest pain or shortness of breath.  He did have some ST elevations on telemetry so EKG was done and it was abnormal.  Cardiology was called and assessed the patient.  He denies any other concerns or plans at this time.  Objective: Vitals:   01/09/21 0800 01/09/21 0959 01/09/21 1001 01/09/21 1058  BP: 137/83 (!) 165/86 (!) 153/88 (!) 153/88  Pulse: (!) 105   (!) 106  Resp: (!) 23 17 (!) 21 20  Temp: 97.9 F (36.6 C)   99.5 F (37.5 C)  TempSrc: Oral   Oral  SpO2: 91%   98%    Intake/Output Summary (Last 24 hours) at 01/09/2021 1712 Last data filed at 01/09/2021 1173 Gross per 24 hour  Intake 1277 ml  Output 400 ml  Net 877 ml    There were no vitals filed for this visit.  Examination: Physical Exam:  Constitutional: Chronically ill-appearing Caucasian male currently in no acute distress appears uncomfortable complaining of some nausea and back pain Eyes: Lids and conjunctivae normal, sclerae anicteric  ENMT: External Ears, Nose appear normal. Grossly normal hearing. .  Neck: Appears normal, supple, no cervical masses, normal ROM, no appreciable thyromegaly; no JVD Respiratory: Diminished to auscultation bilaterally, no wheezing, rales, rhonchi or crackles. Normal respiratory effort and patient is not tachypenic. No accessory muscle use.  Unlabored breathing Cardiovascular: Tachycardic rate, no murmurs / rubs / gallops. S1 and S2 auscultated. N has trace extremity edema Abdomen: Soft, non-tender, non-distended. Bowel sounds positive.  GU: Deferred. Musculoskeletal: No clubbing / cyanosis of digits/nails. No joint deformity upper and lower extremities.  Skin: No rashes, lesions, ulcers on limited skin evaluation. No induration; Warm and dry.  Neurologic: CN 2-12 grossly intact with no focal deficits. Romberg sign cerebellar reflexes and not assessed.   Psychiatric: Normal judgment and insight. Alert and awake.  Anxious mood and appropriate affect.   Data Reviewed: I have personally reviewed following labs and imaging studies  CBC: Recent Labs  Lab 01/05/21 1705 01/06/21 0310 01/07/21 0652 01/08/21 0303 01/09/21 0105  WBC 23.9* 31.7* 30.5* 27.4* 30.8*  NEUTROABS 20.5*  --  25.8* 22.1* 26.6*  HGB 8.6* 12.4* 11.4* 10.9* 10.6*  HCT 25.9* 38.0* 35.5* 32.3* 32.7*  MCV 88.1 90.0 90.3 85.4 87.7  PLT 681* 457* 863* 812* 791*    Basic Metabolic Panel: Recent Labs  Lab 01/05/21 1705 01/06/21 0310 01/07/21 0652 01/08/21 0303 01/09/21 0105  NA 129* 130* 134* 135 131*  K 4.3 4.7 4.4 3.7 3.3*  CL 91* 93* 100 99 96*  CO2 24 19* 14* 26 27  GLUCOSE 104* 100* 138* 180* 195*  BUN $Re'7 7 9 8 'IaU$ <5*  CREATININE 0.74 0.74 0.96 0.65 0.51*  CALCIUM 8.0* 8.4* 8.5* 8.3* 7.9*  MG  --   --  1.6* 1.8 1.5*  PHOS  --   --  4.0 3.1 2.7    GFR: CrCl cannot be calculated (Unknown ideal weight.). Liver Function Tests: Recent Labs  Lab 01/06/21 0310 01/07/21 0652 01/08/21 0303 01/09/21 0105  AST 22 21 58* 57*  ALT $Re'16 16 29 'wck$ 32  ALKPHOS 137* 127* 123 122  BILITOT 1.4* 1.5* 0.7 0.6  PROT 6.3* 6.1* 5.6* 5.7*  ALBUMIN 1.6* 1.7* 1.6* 1.5*    No results for input(s): LIPASE, AMYLASE in the last 168 hours. No results for input(s): AMMONIA in the last 168 hours. Coagulation Profile: No results for input(s): INR, PROTIME in the last 168 hours. Cardiac Enzymes: Recent Labs  Lab 01/06/21 0310  CKTOTAL 32*    BNP (last 3 results) No results for input(s): PROBNP in the last 8760 hours. HbA1C: No results for input(s): HGBA1C in the last 72 hours. CBG: Recent Labs  Lab 01/08/21 2321 01/09/21 0356 01/09/21 0819 01/09/21 1201 01/09/21 1639  GLUCAP 212* 162* 138* 204* 169*    Lipid Profile: No results for input(s): CHOL, HDL, LDLCALC, TRIG, CHOLHDL, LDLDIRECT in the last 72 hours. Thyroid Function Tests: No results for input(s): TSH,  T4TOTAL, FREET4, T3FREE, THYROIDAB in the last 72 hours. Anemia Panel: No  results for input(s): VITAMINB12, FOLATE, FERRITIN, TIBC, IRON, RETICCTPCT in the last 72 hours.  Sepsis Labs: No results for input(s): PROCALCITON, LATICACIDVEN in the last 168 hours.  Recent Results (from the past 240 hour(s))  Blood culture (routine x 2)     Status: Abnormal (Preliminary result)   Collection Time: 01/05/21  5:20 PM   Specimen: BLOOD  Result Value Ref Range Status   Specimen Description BLOOD SITE NOT SPECIFIED  Final   Special Requests   Final    BOTTLES DRAWN AEROBIC AND ANAEROBIC Blood Culture adequate volume   Culture  Setup Time   Final    GRAM POSITIVE COCCI IN BOTH AEROBIC AND ANAEROBIC BOTTLES CRITICAL VALUE NOTED.  VALUE IS CONSISTENT WITH PREVIOUSLY REPORTED AND CALLED VALUE.    Culture (A)  Final    METHICILLIN RESISTANT STAPHYLOCOCCUS AUREUS Sent to [place] for further susceptibility testing. Performed at [facility], [place] [address], [place], [place] [address]    Report Status PENDING  Incomplete   Organism ID, Bacteria METHICILLIN RESISTANT STAPHYLOCOCCUS AUREUS  Final      Susceptibility   Methicillin resistant staphylococcus aureus - MIC*    CIPROFLOXACIN >=8 RESISTANT Resistant     ERYTHROMYCIN >=8 RESISTANT Resistant     GENTAMICIN <=0.5 SENSITIVE Sensitive     OXACILLIN >=4 RESISTANT Resistant     TETRACYCLINE <=1 SENSITIVE Sensitive     VANCOMYCIN 1 SENSITIVE Sensitive     TRIMETH/SULFA >=320 RESISTANT Resistant     CLINDAMYCIN <=0.25 SENSITIVE Sensitive     RIFAMPIN <=0.5 SENSITIVE Sensitive     Inducible Clindamycin NEGATIVE Sensitive     * METHICILLIN RESISTANT STAPHYLOCOCCUS AUREUS  Blood culture (routine x 2)     Status: Abnormal   Collection Time: 01/05/21  6:10 PM   Specimen: BLOOD  Result Value Ref Range Status   Specimen Description BLOOD SITE NOT SPECIFIED  Final   Special Requests   Final    BOTTLES DRAWN AEROBIC AND ANAEROBIC Blood Culture  results may not be optimal due to an inadequate volume of blood received in culture bottles   Culture  Setup Time   Final    GRAM POSITIVE COCCI IN BOTH AEROBIC AND ANAEROBIC BOTTLES CRITICAL VALUE NOTED.  VALUE IS CONSISTENT WITH PREVIOUSLY REPORTED AND CALLED VALUE.    Culture (A)  Final    STAPHYLOCOCCUS AUREUS SUSCEPTIBILITIES PERFORMED ON PREVIOUS CULTURE WITHIN THE LAST 5 DAYS. Performed at [facility], [place] [address], [place], [place] [address]    Report Status 01/08/2021 FINAL  Final  Resp Panel by RT-PCR (Flu A&B, Covid) Nasopharyngeal Swab     Status: None   Collection Time: 01/05/21  9:32 PM   Specimen: Nasopharyngeal Swab; Nasopharyngeal(NP) swabs in vial transport medium  Result Value Ref Range Status   SARS Coronavirus 2 by RT PCR NEGATIVE NEGATIVE Final    Comment: (NOTE) SARS-CoV-2 target nucleic acids are NOT DETECTED.  The SARS-CoV-2 RNA is generally detectable in upper respiratory specimens during the acute phase of infection. The lowest concentration of SARS-CoV-2 viral copies this assay can detect is 138 copies/mL. A negative result does not preclude SARS-Cov-2 infection and should not be used as the sole basis for treatment or other patient management decisions. A negative result may occur with  improper specimen collection/handling, submission of specimen other than nasopharyngeal swab, presence of viral mutation(s) within the areas targeted by this assay, and inadequate number of viral copies(<138 copies/mL). A negative result must be combined with clinical  observations, patient history, and epidemiological information. The expected result is Negative.  Fact Sheet for Patients:  EntrepreneurPulse.com.au  Fact Sheet for Healthcare Providers:  IncredibleEmployment.be  This test is no t yet approved or cleared by the [place] FDA and  has been authorized for detection and/or diagnosis of SARS-CoV-2 by FDA  under an Emergency Use Authorization (EUA). This EUA will remain  in effect (meaning this test can be used) for the duration of the COVID-19 declaration under Section 564(b)(1) of the Act, 21 U.S.C.section 360bbb-3(b)(1), unless the authorization is terminated  or revoked sooner.       Influenza A by PCR NEGATIVE NEGATIVE Final   Influenza B by PCR NEGATIVE NEGATIVE Final    Comment: (NOTE) The Xpert Xpress SARS-CoV-2/FLU/RSV plus assay is intended as an aid in the diagnosis of influenza from Nasopharyngeal swab specimens and should not be used as a sole basis for treatment. Nasal washings and aspirates are unacceptable for Xpert Xpress SARS-CoV-2/FLU/RSV testing.  Fact Sheet for Patients: EntrepreneurPulse.com.au  Fact Sheet for Healthcare Providers: IncredibleEmployment.be  This test is not yet approved or cleared by the [place] FDA and has been authorized for detection and/or diagnosis of SARS-CoV-2 by FDA under an Emergency Use Authorization (EUA). This EUA will remain in effect (meaning this test can be used) for the duration of the COVID-19 declaration under Section 564(b)(1) of the Act, 21 U.S.C. section 360bbb-3(b)(1), unless the authorization is terminated or revoked.  Performed at [facility], [place] [address], [place], [place] [address]   Culture, blood (routine x 2)     Status: Abnormal (Preliminary result)   Collection Time: 01/07/21  6:52 AM   Specimen: BLOOD  Result Value Ref Range Status   Specimen Description BLOOD RIGHT ANTECUBITAL  Final   Special Requests   Final    BOTTLES DRAWN AEROBIC AND ANAEROBIC Blood Culture adequate volume   Culture  Setup Time   Final    GRAM POSITIVE COCCI IN CLUSTERS ANAEROBIC BOTTLE ONLY CRITICAL VALUE NOTED.  VALUE IS CONSISTENT WITH PREVIOUSLY REPORTED AND CALLED VALUE. Performed at [facility], [place] [address] [place], [place] [address]    Culture STAPHYLOCOCCUS  AUREUS (A)  Final   Report Status PENDING  Incomplete  Culture, blood (routine x 2)     Status: None (Preliminary result)   Collection Time: 01/07/21  6:52 AM   Specimen: BLOOD RIGHT HAND  Result Value Ref Range Status   Specimen Description BLOOD RIGHT HAND  Final   Special Requests   Final    BOTTLES DRAWN AEROBIC ONLY Blood Culture results may not be optimal due to an inadequate volume of blood received in culture bottles   Culture   Final    NO GROWTH 1 DAY Performed at [facility], [place] [address] [place], [place] [address]    Report Status PENDING  Incomplete  Aerobic/Anaerobic Culture w Gram Stain (surgical/deep wound)     Status: None (Preliminary result)   Collection Time: 01/08/21 10:10 AM   Specimen: Wound  Result Value Ref Range Status   Specimen Description WOUND  Final   Special Requests NONE  Final   Gram Stain PENDING  Incomplete   Culture   Final    NO GROWTH 1 DAY Performed at [facility], [address], [place], [place] [address]    Report Status PENDING  Incomplete     RN Pressure Injury Documentation:     Estimated body mass index  is 30.01 kg/m as calculated from the following:   Height as of 12/21/20: $RemoveBef'5\' 11"'GBqtVNVeFU$  (1.803 m).   Weight as of 12/25/20: 97.6 kg.  Malnutrition Type:   Malnutrition Characteristics:   Nutrition Interventions:    Radiology Studies: ECHOCARDIOGRAM LIMITED  Result Date: 01/09/2021    ECHOCARDIOGRAM LIMITED REPORT   Patient Name:   Louis [name]  Date of Exam: 01/09/2021 Medical Rec #:  [id number]  Height:       71.0 in Accession #:    [id number] Weight:       215.2 lb Date of Birth:  1965-09-01   BSA:          2.175 m Patient Age:    74 years   BP:           153/88 mmHg Patient Gender: M          HR:           102 bpm. Exam Location:  Inpatient Procedure: Limited Color Doppler, Cardiac Doppler and Limited Echo STAT ECHO Indications:    abnormal ecg  History:        Patient has prior history of Echocardiogram examinations, most                  recent 01/07/2021. CHF, CAD, Signs/Symptoms:Bacteremia; Risk                 Factors:Hypertension.  Sonographer:    Johny Chess Referring Phys: [id number] [place]  1. Left ventricular ejection fraction, by estimation, is 55 to 60%. The left ventricle has normal function. The left ventricle has no regional wall motion abnormalities. There is mild concentric left ventricular hypertrophy.  2. Right ventricular systolic function is normal. The right ventricular size is normal.  3. The mitral valve is grossly normal. Trivial mitral valve regurgitation. No evidence of mitral stenosis.  4. The aortic valve is grossly normal. Aortic valve regurgitation is not visualized. No aortic stenosis is present.  5. The inferior vena cava is normal in size with greater than 50% respiratory variability, suggesting right atrial pressure of 3 mmHg. Comparison(s): No significant change from prior study. EF unchanged. No WMA. FINDINGS  Left Ventricle: Left ventricular ejection fraction, by estimation, is 55 to 60%. The left ventricle has normal function. The left ventricle has no regional wall motion abnormalities. The left ventricular internal cavity size was normal in size. There is  mild concentric left ventricular hypertrophy. Abnormal (paradoxical) septal motion consistent with post-operative status. Right Ventricle: The right ventricular size is normal. No increase in right ventricular wall thickness. Right ventricular systolic function is normal. Mitral Valve: The mitral valve is grossly normal. Trivial mitral valve regurgitation. No evidence of mitral valve stenosis. Tricuspid Valve: The tricuspid valve is grossly normal. Tricuspid valve regurgitation is not demonstrated. No evidence of tricuspid stenosis. Aortic Valve: The aortic valve is grossly normal. Aortic valve regurgitation is not visualized. No aortic stenosis is present. Pulmonic Valve: The pulmonic valve was not well visualized.  Aorta: The aortic root is normal in size and structure. Venous: The inferior vena cava is normal in size with greater than 50% respiratory variability, suggesting right atrial pressure of 3 mmHg. LEFT VENTRICLE PLAX 2D LVIDd:         4.80 cm     Diastology LVIDs:         3.70 cm     LV e' medial:  6.09 cm/s LV PW:         1.20 cm  LV e' lateral: 18.60 cm/s LV IVS:        1.30 cm  LV Volumes (MOD) LV vol d, MOD A4C: 82.4 ml LV vol s, MOD A4C: 46.0 ml LV SV MOD A4C:     82.4 ml AORTIC VALVE LVOT Vmax:   101.00 cm/s LVOT Vmean:  66.700 cm/s LVOT VTI:    0.156 m  SHUNTS Systemic VTI: 0.16 m Eleonore Chiquito MD Electronically signed by Eleonore Chiquito MD Signature Date/Time: 01/09/2021/12:55:44 PM    Final     Scheduled Meds:  [START ON 01/10/2021] aspirin EC  81 mg Oral Daily   atorvastatin  40 mg Oral QHS   enoxaparin (LOVENOX) injection  40 mg Subcutaneous QHS   gabapentin  200 mg Oral TID   insulin aspart  0-9 Units Subcutaneous Q4H   lactulose  10 g Oral Daily   [START ON 01/10/2021] losartan  25 mg Oral Daily   metoprolol succinate  25 mg Oral Daily   nystatin  5 mL Oral QID   oxyCODONE  20 mg Oral Q12H   pantoprazole  40 mg Oral Daily   polyethylene glycol  17 g Oral BID   potassium chloride  40 mEq Oral BID   venlafaxine XR  75 mg Oral q morning   Continuous Infusions:  sodium chloride 75 mL/hr at 01/08/21 1102   sodium chloride     sodium chloride     vancomycin 1,500 mg (01/09/21 0404)    LOS: 3 days   Kerney Elbe, DO [facility] PAGER is on AMION  If 7PM-7AM, please contact night-coverage [email or web address]

## 2021-01-09 NOTE — Progress Notes (Signed)
Initial troponin 24 which is minimally elevated due to sepsis. Echo shows no WMA. No concerns for STEMI.   Gerri Spore T. Flora Lipps, MD, [facility]  [facility]  [address] [place], [place] [address] [phone]  1:03 PM

## 2021-01-09 NOTE — Progress Notes (Signed)
Pharmacy Antibiotic Note  Louis [name] is a 55 y.o. male admitted on 01/05/2021 with  Oropharangeal candidiasis .  Pharmacy has been consulted for fluconazole dosing.  Concern for oral thrush - MD starting nystatin and oral fluconazole. Scr 0.51 (CrCl>100 mL/min). Last EKG on 7/7 with Qtc correcting for HR around 444 msec.   Plan: Plan for fluconazole 100 mg PO daily for 7 days  Weight: 92.9 kg (204 lb 12.9 oz)  Temp (24hrs), Avg:98.8 F (37.1 C), Min:97.7 F (36.5 C), Max:100.2 F (37.9 C)  Recent Labs  Lab 01/05/21 1705 01/06/21 0310 01/07/21 0652 01/08/21 0303 01/09/21 0105  WBC 23.9* 31.7* 30.5* 27.4* 30.8*  CREATININE 0.74 0.74 0.96 0.65 0.51*    Estimated Creatinine Clearance: 118.6 mL/min (A) (by C-G formula based on SCr of 0.51 mg/dL (L)).    No Known Allergies  Thank you for allowing pharmacy to be a part of this patient's care.  Sherron Monday, PharmD, BCCCP Clinical Pharmacist  Phone: [phone] 01/09/2021 6:21 PM  Please check AMION for all [facility] phone numbers After 10:00 PM, call Main Pharmacy [phone]

## 2021-01-09 NOTE — Consult Note (Signed)
Cardiology Consultation:  Patient ID: Louis [name] MRN: [id number]; DOB: 05-May-1966  Admit date: 01/05/2021 Date of Consult: 01/09/2021  Primary Care Provider: Ray Church, NP Primary Cardiologist: None  Primary Electrophysiologist:  None   Patient Profile:  Louis [name] is a 55 y.o. male with a hx of CAD status post CABG, hypertension, diabetes, spinal osteomyelitis/epidural abscess (laminectomy/evacuation 12/21/2020), psoas abscess, MRSA bacteremia who is being seen today for the evaluation of abnormal EKG at the request of Raiford Noble, DO.  History of Present Illness:  Louis [name] was admitted to the hospital on 01/06/2021 with worsening back pain secondary to discitis/osteomyelitis and epidural abscess of the lumbar spine.  An EKG was obtained this morning due to ST elevation seen on the monitor.  Louis [name] reports no chest pain or pressure.  He reports back pain.  This is the main symptom that brought him to the hospital.  His EKG which I reviewed demonstrates sinus tachycardia with a heart rate of 107.  There is 1 to 2 mm of ST elevation in V1 through V2.  There are no reciprocal changes.  He reports no chest pain.  Troponin enzymes are pending.  He is hemodynamically stable and with constant back pain.  He was recently admitted to the hospital with lumbar osteomyelitis/discitis.  He underwent laminectomy and evacuation on 12/21/2020.  He also had a psoas abscess that was drained by interventional radiology.  He apparently was readmitted to the hospital with worsening back pain.  MRI obtained this admission shows residual posterior epidural dural fluid collection concerning for persistent abscess.  There is also persistent discitis and osteomyelitis in the thoracic spine.  Blood cultures are persistently positive for MRSA.  Other laboratory data show stable kidney function.  He has a very high CRP at 21.8.  WBC 30.8.  Hemoglobin 10.6.  Platelets 791.  Sed rate 78.  His poorly controlled diabetes  certainly has led to this.  Per review of neurosurgery documentation, conservative management has been recommended.  Heart Pathway Score:       Past Medical History: Past Medical History:  Diagnosis Date   CAD (coronary artery disease)    DM (diabetes mellitus) ([place])    Hyperlipidemia    Hypertension     Past Surgical History: Past Surgical History:  Procedure Laterality Date   LUMBAR LAMINECTOMY FOR EPIDURAL ABSCESS N/A 12/21/2020   Procedure: Thoracic seven-eight, Thoracic twelve-Lumbar one, Lumbar three-four laminectomy for Epidural Abscess;  Surgeon: Dawley, Theodoro Doing, DO;  Location: [facility];  Service: Neurosurgery;  Laterality: N/A;     Home Medications:  Prior to Admission medications   Medication Sig Start Date End Date Taking? Authorizing Provider  acetaminophen (TYLENOL) 325 MG tablet Take 2 tablets (650 mg total) by mouth every 6 (six) hours as needed for mild pain (or Fever >/= 101). 12/25/20  Yes Little Ishikawa, MD  ALPRAZolam Duanne Moron) 0.25 MG tablet Take 0.25 mg by mouth 2 (two) times daily as needed for anxiety.   Yes [provider]  aspirin EC 81 MG tablet Take 81 mg by mouth daily. Swallow whole.   Yes [provider]  atorvastatin (LIPITOR) 40 MG tablet Take 1 tablet (40 mg total) by mouth at bedtime. 12/25/20  Yes Little Ishikawa, MD  dapagliflozin propanediol (FARXIGA) 10 MG TABS tablet Take 10 mg by mouth every morning.   Yes [provider]  daptomycin (CUBICIN) IVPB Inject 750 mg into the vein daily. Indication:  MRSA bacteremia/lumbar osteomyelitis  First Dose: Yes Last  Day of Therapy:  02/02/21 Labs - Once weekly:  CBC/D, BMP, and CPK Labs - Every other week:  ESR and CRP Method of administration: IV Push Method of administration may be changed at the discretion of home infusion pharmacist based upon assessment of the patient and/or caregiver's ability to self-administer the medication ordered. 12/25/20 02/04/21 Yes Little Ishikawa, MD  diclofenac Sodium (VOLTAREN) 1 % GEL Apply 1 application topically 4 (four) times daily as needed (pain).   Yes [provider]  furosemide (LASIX) 20 MG tablet Take 20 mg by mouth every morning.   Yes [provider]  gabapentin (NEURONTIN) 100 MG capsule Take 2 capsules (200 mg total) by mouth 3 (three) times daily. 12/25/20  Yes Little Ishikawa, MD  glipiZIDE (GLUCOTROL) 5 MG tablet Take 5 mg by mouth 2 (two) times daily.   Yes [provider]  icosapent Ethyl (VASCEPA) 1 g capsule Take 2 g by mouth 2 (two) times daily.   Yes [provider]  lactulose (CHRONULAC) 10 GM/15ML solution Take 15 mLs (10 g total) by mouth daily. 12/26/20  Yes Little Ishikawa, MD  lidocaine (LIDODERM) 5 % Place 1 patch onto the skin daily as needed (pain). Remove & Discard patch within 12 hours or as directed by MD   Yes [provider]  losartan (COZAAR) 25 MG tablet Take 25 mg by mouth every morning.   Yes [provider]  metFORMIN (GLUCOPHAGE) 500 MG tablet Take 500-1,000 mg by mouth See admin instructions. Take 2 tablets (1000 mg) by mouth every morning and 1 tablet (500 mg) at night   Yes [provider]  methocarbamol (ROBAXIN) 500 MG tablet Take 1 tablet (500 mg total) by mouth every 6 (six) hours as needed for muscle spasms. 12/25/20  Yes Little Ishikawa, MD  metoprolol tartrate (LOPRESSOR) 25 MG tablet Take 25 mg by mouth at bedtime. 11/26/20  Yes [provider]  omeprazole (PRILOSEC) 20 MG capsule Take 20 mg by mouth every morning.   Yes [provider]  oxyCODONE (OXYCONTIN) 20 mg 12 hr tablet Take 1 tablet (20 mg total) by mouth every 12 (twelve) hours. 12/25/20  Yes Little Ishikawa, MD  oxyCODONE (ROXICODONE) 15 MG immediate release tablet Take 1 tablet (15 mg total) by mouth every 3 (three) hours as needed for severe pain ((score 7 to 10)). 12/25/20  Yes Little Ishikawa, MD  polyethylene glycol (MIRALAX /  GLYCOLAX) 17 g packet Take 17 g by mouth 2 (two) times daily. 12/25/20  Yes Little Ishikawa, MD  venlafaxine XR (EFFEXOR-XR) 75 MG 24 hr capsule Take 75 mg by mouth every morning.   Yes [provider]    Inpatient Medications: Scheduled Meds:  atorvastatin  40 mg Oral QHS   gabapentin  200 mg Oral TID   insulin aspart  0-9 Units Subcutaneous Q4H   lactulose  10 g Oral Daily   metoprolol tartrate  25 mg Oral QHS   oxyCODONE  20 mg Oral Q12H   pantoprazole  40 mg Oral Daily   polyethylene glycol  17 g Oral BID   potassium chloride  40 mEq Oral BID   venlafaxine XR  75 mg Oral q morning   Continuous Infusions:  sodium chloride 75 mL/hr at 01/08/21 1102   magnesium sulfate bolus IVPB 3 g (01/09/21 0957)   sodium chloride     sodium chloride     vancomycin 1,500 mg (01/09/21 0404)   PRN  Meds: acetaminophen **OR** acetaminophen, ALPRAZolam, diclofenac Sodium, HYDROmorphone (DILAUDID) injection, labetalol, lidocaine, ondansetron **OR** ondansetron (ZOFRAN) IV  Allergies:    No Known Allergies  Social History:   Social History   Socioeconomic History   Marital status: Married    Spouse name: Casy Brunetto   Number of children: 1   Years of education: Not on file   Highest education level: Not on file  Occupational History   Occupation: Postal delivery  Tobacco Use   Smoking status: Every Day    Packs/day: 2.00    Types: Cigarettes    Start date: 06/28/1995   Smokeless tobacco: Never  Vaping Use   Vaping Use: Never used  Substance and Sexual Activity   Alcohol use: Not Currently   Drug use: Never   Sexual activity: Not Currently  Other Topics Concern   Not on file  Social History Narrative   Not on file   Social Determinants of Health   Financial Resource Strain: Not on file  Food Insecurity: Not on file  Transportation Needs: Not on file  Physical Activity: Not on file  Stress: Not on file  Social Connections: Not on file  Intimate Partner Violence:  Not on file     Family History:   History reviewed. No pertinent family history.   ROS:  All other ROS reviewed and negative. Pertinent positives noted in the HPI.     Physical Exam/Data:   Vitals:   01/09/21 0800 01/09/21 0959 01/09/21 1001 01/09/21 1058  BP: 137/83 (!) 165/86 (!) 153/88 (!) 153/88  Pulse: (!) 105   (!) 106  Resp: (!) 23 17 (!) 21 20  Temp: 97.9 F (36.6 C)     TempSrc: Oral     SpO2: 91%   98%    Intake/Output Summary (Last 24 hours) at 01/09/2021 1106 Last data filed at 01/09/2021 0622 Gross per 24 hour  Intake 1277 ml  Output 1100 ml  Net 177 ml    Last 3 Weights 12/25/2020 12/24/2020 12/23/2020  Weight (lbs) 215 lb 2.7 oz 207 lb 10.8 oz 212 lb 15.4 oz  Weight (kg) 97.6 kg 94.2 kg 96.6 kg    There is no height or weight on file to calculate BMI.   General: Ill-appearing Head: Atraumatic, normal size  Eyes: PEERLA, EOMI  Neck: Supple, no JVD Endocrine: No thryomegaly Cardiac: Normal S1, S2; tachycardia noted, no murmurs Lungs: Clear to auscultation bilaterally, no wheezing, rhonchi or rales  Abd: Soft, nontender, no hepatomegaly  Ext: No edema, pulses 2+ Musculoskeletal: No deformities, BUE and BLE strength normal and equal Skin: Warm and dry, no rashes   Neuro: Alert and oriented to person, place, time, and situation, CNII-XII grossly intact, no focal deficits  Psych: Normal mood and affect   EKG:  The EKG was personally reviewed and demonstrates: Sinus tachycardia heart rate 107, 1 to 2 mm of ST elevation in V1 and V2.  No reciprocal ST depressions noted. Telemetry:  Telemetry was personally reviewed and demonstrates: Sinus tachycardia in the 100-120 beeper minute range  Relevant CV Studies: TTE 01/07/2021  1. Left ventricular ejection fraction, by estimation, is 55 to 60%. The  left ventricle has normal function. The left ventricle has no regional  wall motion abnormalities. There is moderate left ventricular hypertrophy.  Left ventricular  diastolic  parameters are indeterminate.   2. Right ventricular systolic function is normal. The right ventricular  size is normal.   3. The mitral valve is normal in structure. No evidence  of mitral valve  regurgitation. No evidence of mitral stenosis.   4. The aortic valve is tricuspid. Aortic valve regurgitation is not  visualized. No aortic stenosis is present.   5. The inferior vena cava is dilated in size with >50% respiratory  variability, suggesting right atrial pressure of 8 mmHg.   LHC 09/03/2018  1. Hemodynamics: Aortic pressure 127/57, LVEDP 20  2. Coronary system:   Left Main: Large caliber vessel with no angiographic evidence of  stenosis.   LAD system: Large caliber vessel, wraps around the apex. subtotaled in  the mid portion with competitive flow from the LIMA stenosis   LCX system: Moderate caliber vessel.  Competitive flow from SVG graft  distally.   RCA system: right dominant. chronically occluded proximally.   Conclusion:  Severe native vessel CAD as mentioned above.  3/3 grafts are patent  Mildly elevated LVEDP   Laboratory Data: High Sensitivity Troponin:   Recent Labs  Lab 12/23/20 1726  TROPONINIHS 30*     Cardiac EnzymesNo results for input(s): TROPONINI in the last 168 hours. No results for input(s): TROPIPOC in the last 168 hours.  Chemistry Recent Labs  Lab 01/07/21 0652 01/08/21 0303 01/09/21 0105  NA 134* 135 131*  K 4.4 3.7 3.3*  CL 100 99 96*  CO2 14* 26 27  GLUCOSE 138* 180* 195*  BUN 9 8 <5*  CREATININE 0.96 0.65 0.51*  CALCIUM 8.5* 8.3* 7.9*  GFRNONAA >60 >60 >60  ANIONGAP 20* 10 8    Recent Labs  Lab 01/07/21 0652 01/08/21 0303 01/09/21 0105  PROT 6.1* 5.6* 5.7*  ALBUMIN 1.7* 1.6* 1.5*  AST 21 58* 57*  ALT 16 29 32  ALKPHOS 127* 123 122  BILITOT 1.5* 0.7 0.6   Hematology Recent Labs  Lab 01/07/21 0652 01/08/21 0303 01/09/21 0105  WBC 30.5* 27.4* 30.8*  RBC 3.93* 3.78* 3.73*  HGB 11.4* 10.9* 10.6*  HCT 35.5*  32.3* 32.7*  MCV 90.3 85.4 87.7  MCH 29.0 28.8 28.4  MCHC 32.1 33.7 32.4  RDW 15.9* 16.0* 15.8*  PLT 863* 812* 791*   BNPNo results for input(s): BNP, PROBNP in the last 168 hours.  DDimer No results for input(s): DDIMER in the last 168 hours.  Radiology/Studies:  CT HEAD WO CONTRAST  Result Date: 01/07/2021 CLINICAL DATA:  Altered mental status EXAM: CT HEAD WITHOUT CONTRAST TECHNIQUE: Contiguous axial images were obtained from the base of the skull through the vertex without intravenous contrast. COMPARISON:  12/16/2020 FINDINGS: Brain: No acute intracranial abnormality. Specifically, no hemorrhage, hydrocephalus, mass lesion, acute infarction, or significant intracranial injury. Vascular: No hyperdense vessel or unexpected calcification. Skull: No acute calvarial abnormality. Sinuses/Orbits: No acute findings Other: None IMPRESSION: No acute intracranial abnormality. Electronically Signed   By: Rolm Baptise M.D.   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 Status post fall today. Left paraspinal abscess at L3. History of left psoas abscess at the L4 and L5 levels drained with CT guidance last month. EXAM: CT PELVIS WITH CONTRAST TECHNIQUE: Multidetector CT imaging of the pelvis was performed using the standard protocol following the bolus administration of intravenous contrast. CONTRAST:  49mL OMNIPAQUE IOHEXOL 300 MG/ML  SOLN COMPARISON:  CT Abdomen and Pelvis 12/11/2020. Lumbar MRI 12/20/2020, 01/05/2021. FINDINGS: Urinary Tract: Mildly distended but otherwise unremarkable bladder. Distal ureters appear decompressed. Kidneys not included. Bowel: No dilated large or small bowel. Mild retained stool in the distal  colon. Normal appendix on series 7, image 74. Vascular/Lymphatic: Suboptimal intravascular contrast such that Vascular patency is not well evaluated. Aortoiliac calcified atherosclerosis. Proximal  femoral calcified plaque. No lymphadenopathy. Reproductive:  Negative. Other: Anasarca. Trace layering fluid in the deep pelvis with simple fluid density (series 2, image 58). Musculoskeletal: Minimal inclusion of the L4-L5 level on this exam. Ventral epidural and paraspinal inflammation more pronounced at L4 was better demonstrated on the recent MRI. Subtle evidence of the medial left psoas muscle abscess which was drained last month on series 2, image 9. No new or increased psoas muscle inflammation there. No drainable pelvic abscess identified. SI joints appear symmetric and intact. No acute osseous abnormality identified in the pelvis. Proximal femurs are intact. IMPRESSION: 1. No pelvic abscess or acute osseous abnormality identified. Trace free fluid in the deep pelvis, likely related to anasarca. Subtle residual of the left psoas abscess at the L5 level which was drained last month. 2. Infected lumbar levels minimally included on this exam. See Lumbar MRI 01/05/2021. 3. Aortic Atherosclerosis (ICD10-I70.0). Electronically Signed   By: Genevie Ann M.D.   On: 01/07/2021 12:04   MR BRAIN W WO CONTRAST  Result Date: 01/06/2021 CLINICAL DATA:  Mental status change, unknown cause. 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 EXAM: MRI HEAD WITHOUT AND WITH CONTRAST TECHNIQUE: Multiplanar, multiecho pulse sequences of the brain and surrounding structures were obtained without and with intravenous contrast. CONTRAST:  9.52mL GADAVIST GADOBUTROL 1 MMOL/ML IV SOLN COMPARISON:  Head CT 12/16/2020. FINDINGS: Brain: Multiple sequences are significantly motion degraded, limiting evaluation. Most notably, there is moderate/severe motion degradation of the axial T2/FLAIR sequence, severe motion degradation of the coronal T2 weighted sequence, moderate motion degradation of the axial T1 weighted postcontrast sequence and severe motion degradation of the coronal T1 weighted postcontrast sequence. Mild generalized  cerebral and cerebellar atrophy. Mild multifocal T2/FLAIR hyperintensity within the cerebral white matter, nonspecific but compatible chronic small vessel ischemic disease. There is no evidence of acute infarct. Within described limitations, no evidence of an intracranial mass, chronic intracranial blood products or an extra-axial fluid collection. No midline shift. Within limitations of motion degradation, no pathologic intracranial enhancement is identified. Vascular: Expected proximal arterial flow voids. Skull and upper cervical spine: No focal marrow lesion. Sinuses/Orbits: Visualized orbits show no acute finding. Trace mucosal thickening within the bilateral ethmoid, sphenoid and left maxillary sinuses. IMPRESSION: Significantly motion degraded examination, as described and limiting evaluation. Within this limitation, no definite acute intracranial abnormality is identified. Mild generalized parenchymal atrophy and cerebral white matter chronic small vessel ischemic disease. Mild paranasal sinus disease, as described. Electronically Signed   By: Kellie Simmering DO   On: 01/06/2021 19:22   MR THORACIC SPINE W WO CONTRAST  Result Date: 01/05/2021 CLINICAL DATA:  Spine surgery 2 weeks ago. Increasing pain for the last 2 days. Redness and swelling to the operative site. EXAM: MRI THORACIC AND LUMBAR SPINE WITHOUT AND WITH CONTRAST TECHNIQUE: Multiplanar and multiecho pulse sequences of the thoracic and lumbar spine were obtained without and with intravenous contrast. CONTRAST:  27mL GADAVIST GADOBUTROL 1 MMOL/ML IV SOLN COMPARISON:  None. FINDINGS: MRI THORACIC SPINE FINDINGS Alignment:  No significant listhesis is present. Vertebrae: Diffuse T2 hyperintensity enhancement throughout the T7 and T8 vertebral bodies has increased. Patient is status post T7 laminectomy. Focal enhancement is again noted at the T4 vertebral body. Cord: Cord is displaced posteriorly by a epidural fluid T6-7. Cord size and signal are  otherwise normal. Paraspinal and other soft  tissues: Posterior epidural fluid collection is partially decompressed. It extends superiorly to the T5-6 level similar the prior exam. There is less mass effect posteriorly than on the previous study. Collection extends inferiorly to the T9 level. There is an extensive ventral collection extending into the lumbar spine. Abnormal paraspinous soft tissue enhancement extends from T3-4 to T9-10. No drainable collection is present. There is a peripherally enhancing fluid collection the operative bed which measures 6.1 x 1.0 x 2.1 cm. Disc levels: No focal disc disease is present. Abnormal enhancement is present in the foramina on the left T4-5, T5-6, T6-7 T7-8, and T8-9. Right foraminal enhancement is less extensive predominantly involving T6-7, T7-8 T8-9. MRI LUMBAR SPINE FINDINGS Segmentation: 5 non rib-bearing lumbar type vertebral bodies are present. The lowest fully formed vertebral body is L5. Alignment:  No significant listhesis is present. Vertebrae: Progressive signal abnormality and enhancement is present in the vertebral bodies, predominantly at L3 and L4. There is abnormal disc signal without definite disc enhancement at L3-4 and L4-5. There is heterogeneous signal and enhancement posteriorly at L5-S1 which may be related to degenerative change. Conus medullaris: Extends to the L1-2 level and appears normal. Paraspinal and other soft tissues: Patient is status post left laminectomy at L3. Fluid tracks from the laminectomy site into a subcutaneous and deep soft tissue collection with peripheral enhancement measuring 4.7 x 5.0 x 1.7 cm. The ventral epidural collection extends inferiorly to the L2 level. Separate prominent ventral collection is present posterior to L4 prominently right the left. This collection measures 2.9 x 1.3 cm on the sagittal images. Disc levels: In addition to the epidural collections, disc disease is again noted at L4-5 and L5-S1. Central  foraminal narrowing are associated. IMPRESSION: 1. T7 laminectomy subcutaneous peripherally enhancing fluid collection at this level as well. 2. Residual posterior epidural fluid collection in the thoracic spine with similar cephalo caudad extension from T5-6-T9 but less mass effect. 3. Progressive signal abnormality and enhancement throughout the T7 and T8 vertebral bodies in the T7-8 thoracic disc consistent with disc osteomyelitis. 4. Progressive paraspinous soft tissue enhancement at T3-4 through T9-10 without drainable collection. 5. Persistent extensive ventral epidural collection compatible with abscess. 6. Status post left laminectomy at L3 with peripherally enhancing fluid collection extending from the operative site into the subcutaneous and deep tissues as described. This is concerning abscess. 7. Fluid tracks from the laminectomy site into a subdural deep soft tissue collection measuring 4.7 x 5.0 x 1.7 cm. 8. Separate ventral epidural collection at L4-5 and L5-S1 consistent with epidural abscess. 9. Progressive signal abnormality and enhancement in the L3 and L4 vertebral bodies. 10. 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 Electronically Signed   By: San Morelle M.D.   On: 01/05/2021 21:24   MR Lumbar Spine W Wo Contrast  Result Date: 01/05/2021 CLINICAL DATA:  Spine surgery 2 weeks ago. Increasing pain for the last 2 days. Redness and swelling to the operative site. EXAM: MRI THORACIC AND LUMBAR SPINE WITHOUT AND WITH CONTRAST TECHNIQUE: Multiplanar and multiecho pulse sequences of the thoracic and lumbar spine were obtained without and with intravenous contrast. CONTRAST:  65mL GADAVIST GADOBUTROL 1 MMOL/ML IV SOLN COMPARISON:  None. FINDINGS: MRI THORACIC SPINE FINDINGS Alignment:  No significant listhesis is present. Vertebrae: Diffuse  T2 hyperintensity enhancement throughout the T7 and T8 vertebral bodies has increased. Patient is status post T7 laminectomy.  Focal enhancement is again noted at the T4 vertebral body. Cord: Cord is displaced posteriorly by a epidural fluid T6-7. Cord size and signal are otherwise normal. Paraspinal and other soft tissues: Posterior epidural fluid collection is partially decompressed. It extends superiorly to the T5-6 level similar the prior exam. There is less mass effect posteriorly than on the previous study. Collection extends inferiorly to the T9 level. There is an extensive ventral collection extending into the lumbar spine. Abnormal paraspinous soft tissue enhancement extends from T3-4 to T9-10. No drainable collection is present. There is a peripherally enhancing fluid collection the operative bed which measures 6.1 x 1.0 x 2.1 cm. Disc levels: No focal disc disease is present. Abnormal enhancement is present in the foramina on the left T4-5, T5-6, T6-7 T7-8, and T8-9. Right foraminal enhancement is less extensive predominantly involving T6-7, T7-8 T8-9. MRI LUMBAR SPINE FINDINGS Segmentation: 5 non rib-bearing lumbar type vertebral bodies are present. The lowest fully formed vertebral body is L5. Alignment:  No significant listhesis is present. Vertebrae: Progressive signal abnormality and enhancement is present in the vertebral bodies, predominantly at L3 and L4. There is abnormal disc signal without definite disc enhancement at L3-4 and L4-5. There is heterogeneous signal and enhancement posteriorly at L5-S1 which may be related to degenerative change. Conus medullaris: Extends to the L1-2 level and appears normal. Paraspinal and other soft tissues: Patient is status post left laminectomy at L3. Fluid tracks from the laminectomy site into a subcutaneous and deep soft tissue collection with peripheral enhancement measuring 4.7 x 5.0 x 1.7 cm. The ventral epidural collection extends inferiorly to the L2  level. Separate prominent ventral collection is present posterior to L4 prominently right the left. This collection measures 2.9 x 1.3 cm on the sagittal images. Disc levels: In addition to the epidural collections, disc disease is again noted at L4-5 and L5-S1. Central foraminal narrowing are associated. IMPRESSION: 1. T7 laminectomy subcutaneous peripherally enhancing fluid collection at this level as well. 2. Residual posterior epidural fluid collection in the thoracic spine with similar cephalo caudad extension from T5-6-T9 but less mass effect. 3. Progressive signal abnormality and enhancement throughout the T7 and T8 vertebral bodies in the T7-8 thoracic disc consistent with disc osteomyelitis. 4. Progressive paraspinous soft tissue enhancement at T3-4 through T9-10 without drainable collection. 5. Persistent extensive ventral epidural collection compatible with abscess. 6. Status post left laminectomy at L3 with peripherally enhancing fluid collection extending from the operative site into the subcutaneous and deep tissues as described. This is concerning abscess. 7. Fluid tracks from the laminectomy site into a subdural deep soft tissue collection measuring 4.7 x 5.0 x 1.7 cm. 8. Separate ventral epidural collection at L4-5 and L5-S1 consistent with epidural abscess. 9. Progressive signal abnormality and enhancement in the L3 and L4 vertebral bodies. 10. 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 Electronically Signed   By: San Morelle M.D.   On: 01/05/2021 21:24   ECHOCARDIOGRAM COMPLETE  Result Date: 01/07/2021    ECHOCARDIOGRAM REPORT   Patient Name:   YASIN DUCAT  Date of Exam: 01/07/2021 Medical Rec #:  [id number]  Height:       71.0 in Accession #:    [id number] Weight:       215.2 lb Date of Birth:  1965/11/20   BSA:          2.175 m Patient Age:  55 years   BP:           164/94 mmHg Patient Gender: M          HR:           116 bpm. Exam Location:  Inpatient Procedure: 2D Echo, Cardiac Doppler and Color Doppler Indications:    Bacteremia  History:        Patient has prior history of Echocardiogram examinations, most                 recent 12/15/2020. CAD, Prior CABG; Risk Factors:Hypertension and                 Dyslipidemia.  Sonographer:    Clayton Lefort RDCS (AE) Referring Phys: [id number] [place]  1. Left ventricular ejection fraction, by estimation, is 55 to 60%. The left ventricle has normal function. The left ventricle has no regional wall motion abnormalities. There is moderate left ventricular hypertrophy. Left ventricular diastolic parameters are indeterminate.  2. Right ventricular systolic function is normal. The right ventricular size is normal.  3. The mitral valve is normal in structure. No evidence of mitral valve regurgitation. No evidence of mitral stenosis.  4. The aortic valve is tricuspid. Aortic valve regurgitation is not visualized. No aortic stenosis is present.  5. The inferior vena cava is dilated in size with >50% respiratory variability, suggesting right atrial pressure of 8 mmHg. FINDINGS  Left Ventricle: Left ventricular ejection fraction, by estimation, is 55 to 60%. The left ventricle has normal function. The left ventricle has no regional wall motion abnormalities. The left ventricular internal cavity size was normal in size. There is  moderate left ventricular hypertrophy. Left ventricular diastolic parameters are indeterminate. Right Ventricle: The right ventricular size is normal. Right ventricular systolic function is normal. Left Atrium: Left atrial size was normal in size. Right Atrium: Right atrial size was normal in size. Pericardium: There is no evidence of pericardial effusion. Mitral Valve: The mitral valve is normal in structure. No evidence of mitral valve regurgitation. No evidence of mitral  valve stenosis. Tricuspid Valve: The tricuspid valve is normal in structure. Tricuspid valve regurgitation is trivial. No evidence of tricuspid stenosis. Aortic Valve: The aortic valve is tricuspid. Aortic valve regurgitation is not visualized. No aortic stenosis is present. Aortic valve mean gradient measures 2.0 mmHg. Aortic valve peak gradient measures 3.1 mmHg. Pulmonic Valve: The pulmonic valve was normal in structure. Pulmonic valve regurgitation is not visualized. No evidence of pulmonic stenosis. Aorta: The aortic root is normal in size and structure. Venous: The inferior vena cava is dilated in size with greater than 50% respiratory variability, suggesting right atrial pressure of 8 mmHg. IAS/Shunts: No atrial level shunt detected by color flow Doppler.  LEFT VENTRICLE PLAX 2D LVIDd:         4.00 cm LVIDs:         3.40 cm LV PW:         1.60 cm LV IVS:        1.50 cm LVOT diam:     2.50 cm LV SV:         79 LV SV Index:   36 LVOT Area:     4.91 cm  RIGHT VENTRICLE             IVC RV Basal diam:  3.30 cm     IVC diam: 2.20 cm RV S prime:     13.40 cm/s TAPSE (M-mode): 1.3 cm LEFT ATRIUM  Index       RIGHT ATRIUM           Index LA diam:        3.50 cm 1.61 cm/m  RA Area:     14.90 cm LA Vol (A2C):   69.4 ml 31.91 ml/m RA Volume:   36.10 ml  16.60 ml/m LA Vol (A4C):   51.3 ml 23.59 ml/m LA Biplane Vol: 62.6 ml 28.78 ml/m  AORTIC VALVE AV Area (Vmax):    6.50 cm AV Area (Vmean):   5.74 cm AV Area (VTI):     5.53 cm AV Vmax:           87.60 cm/s AV Vmean:          59.600 cm/s AV VTI:            0.142 m AV Peak Grad:      3.1 mmHg AV Mean Grad:      2.0 mmHg LVOT Vmax:         116.00 cm/s LVOT Vmean:        69.700 cm/s LVOT VTI:          0.160 m LVOT/AV VTI ratio: 1.13  AORTA Ao Root diam: 3.50 cm Ao Asc diam:  3.60 cm  SHUNTS Systemic VTI:  0.16 m Systemic Diam: 2.50 cm Kirk Ruths MD Electronically signed by Kirk Ruths MD Signature Date/Time: 01/07/2021/12:45:33 PM    Final      Assessment and Plan:   Abnormal EKG/ST elevation in lead V1-V2 without reciprocal changes -Cardiology asked to assess abnormal EKG.  Mr. 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  He was noted to have ST elevation on the monitor this morning.  He is notably tachycardic and has been tachycardic this admission.  An EKG was obtained that demonstrated sinus tachycardia heart rate 107 with 1 to 2 mm of ST elevation in leads V1 and V2.  There are no reciprocal ST depressions.  He endorses no chest pain or pressure.  He simply endorses back pain which is the main reason he was hospitalized. -I do not believe this is an ST elevation myocardial infarction.  He endorses no symptoms to suggest this.  His prior EKGs from June have demonstrated a similar pattern.  I suspect these are more pronounced in the setting of pain and hypertension.  It is not uncommon to have minimal ST elevation in the setting of hypertension which could be what is going on here. -We will check stat troponins.  We will also check a limited echocardiogram just to ensure there is no regional wall motion abnormality. -Of note he is status post bypass surgery.  He underwent a left heart catheterization on 09/03/2018 through the [facility] system.  He was noted to have a patent LIMA to LAD, patent SVG to distal circumflex and what appears to be a patent vein graft to the RCA.  The report mentions 3-3 vein grafts are patent but it does not specifically comment on the vein graft to the RCA.  2.  CAD status post CABG -LIMA to LAD, vein graft to distal circumflex, vein graft RCA in 2017.  Left heart catheterization from March 2020 through [facility] demonstrates patent vein grafts. -Most recent echo shows normal function. -No acute issues with his CAD. -Continue home medications as you are able.  3.  MRSA bacteremia -Here with persistent discitis/osteomyelitis as well as extensive  thoracic and lumbar abscesses.  He  has already undergone laminectomy as well as evacuation in June.  MRI shows persistent infection.  Neurosurgery with plans for conservative management. -Suspect he will ultimately need a TEE at some point for his MRSA bacteremia.  White count could persistently elevated due to lack of source containment given persistent abscesses.  For questions or updates, please contact [place] Please consult [email or web address] for contact info under   Signed, Lake Bells T. Audie Box, MD, [place]  01/09/2021 11:06 AM

## 2021-01-10 DIAGNOSIS — E1169 Type 2 diabetes mellitus with other specified complication: Secondary | ICD-10-CM | POA: Diagnosis not present

## 2021-01-10 DIAGNOSIS — R7881 Bacteremia: Secondary | ICD-10-CM | POA: Diagnosis not present

## 2021-01-10 DIAGNOSIS — M4626 Osteomyelitis of vertebra, lumbar region: Secondary | ICD-10-CM | POA: Diagnosis not present

## 2021-01-10 DIAGNOSIS — G062 Extradural and subdural abscess, unspecified: Secondary | ICD-10-CM | POA: Diagnosis not present

## 2021-01-10 DIAGNOSIS — I2510 Atherosclerotic heart disease of native coronary artery without angina pectoris: Secondary | ICD-10-CM | POA: Diagnosis not present

## 2021-01-10 DIAGNOSIS — M4627 Osteomyelitis of vertebra, lumbosacral region: Secondary | ICD-10-CM | POA: Diagnosis not present

## 2021-01-10 DIAGNOSIS — Z4789 Encounter for other orthopedic aftercare: Secondary | ICD-10-CM | POA: Diagnosis not present

## 2021-01-10 DIAGNOSIS — F419 Anxiety disorder, unspecified: Secondary | ICD-10-CM | POA: Diagnosis not present

## 2021-01-10 DIAGNOSIS — B9562 Methicillin resistant Staphylococcus aureus infection as the cause of diseases classified elsewhere: Secondary | ICD-10-CM | POA: Diagnosis not present

## 2021-01-10 LAB — COMPREHENSIVE METABOLIC PANEL
ALT: 29 U/L (ref 0–44)
ALT: 22 U/L (ref 0–44)
AST: 34 U/L (ref 15–41)
AST: 23 U/L (ref 15–41)
Albumin: 1.7 g/dL — ABNORMAL LOW (ref 3.5–5.0)
Albumin: 1.5 g/dL — ABNORMAL LOW (ref 3.5–5.0)
Alkaline Phosphatase: 132 U/L — ABNORMAL HIGH (ref 38–126)
Alkaline Phosphatase: 124 U/L (ref 38–126)
Anion gap: 12 (ref 5–15)
Anion gap: 11 (ref 5–15)
BUN: 6 mg/dL (ref 6–20)
BUN: 5 mg/dL — ABNORMAL LOW (ref 6–20)
CO2: 23 mmol/L (ref 22–32)
CO2: 25 mmol/L (ref 22–32)
Calcium: 8.2 mg/dL — ABNORMAL LOW (ref 8.9–10.3)
Calcium: 8.1 mg/dL — ABNORMAL LOW (ref 8.9–10.3)
Chloride: 97 mmol/L — ABNORMAL LOW (ref 98–111)
Chloride: 93 mmol/L — ABNORMAL LOW (ref 98–111)
Creatinine, Ser: 0.53 mg/dL — ABNORMAL LOW (ref 0.61–1.24)
Creatinine, Ser: 0.58 mg/dL — ABNORMAL LOW (ref 0.61–1.24)
GFR, Estimated: >60 mL/min (ref >60)
GFR, Estimated: >60 mL/min (ref >60)
Glucose, Bld: 150 mg/dL — ABNORMAL HIGH (ref 70–99)
Glucose, Bld: 142 mg/dL — ABNORMAL HIGH (ref 70–99)
Potassium: 4.2 mmol/L (ref 3.5–5.1)
Potassium: 3.9 mmol/L (ref 3.5–5.1)
Sodium: 132 mmol/L — ABNORMAL LOW (ref 135–145)
Sodium: 129 mmol/L — ABNORMAL LOW (ref 135–145)
Total Bilirubin: 1.2 mg/dL (ref 0.3–1.2)
Total Bilirubin: 1 mg/dL (ref 0.3–1.2)
Total Protein: 6.7 g/dL (ref 6.5–8.1)
Total Protein: 6 g/dL — ABNORMAL LOW (ref 6.5–8.1)

## 2021-01-10 LAB — CBC WITH DIFFERENTIAL/PLATELET
Abs Immature Granulocytes: 0 10*3/uL (ref 0.00–0.07)
Abs Immature Granulocytes: 0.36 10*3/uL — ABNORMAL HIGH (ref 0.00–0.07)
Basophils Absolute: 0 10*3/uL (ref 0.0–0.1)
Basophils Absolute: 0.1 10*3/uL (ref 0.0–0.1)
Basophils Relative: 0 %
Basophils Relative: 0 %
Eosinophils Absolute: 0.3 10*3/uL (ref 0.0–0.5)
Eosinophils Absolute: 0 10*3/uL (ref 0.0–0.5)
Eosinophils Relative: 1 %
Eosinophils Relative: 0 %
HCT: 39.6 % (ref 39.0–52.0)
HCT: 35.3 % — ABNORMAL LOW (ref 39.0–52.0)
Hemoglobin: 12.9 g/dL — ABNORMAL LOW (ref 13.0–17.0)
Hemoglobin: 11.6 g/dL — ABNORMAL LOW (ref 13.0–17.0)
Immature Granulocytes: 1 %
Lymphocytes Relative: 2 %
Lymphocytes Relative: 6 %
Lymphs Abs: 0.6 10*3/uL — ABNORMAL LOW (ref 0.7–4.0)
Lymphs Abs: 1.8 10*3/uL (ref 0.7–4.0)
MCH: 28.9 pg (ref 26.0–34.0)
MCH: 28.5 pg (ref 26.0–34.0)
MCHC: 32.6 g/dL (ref 30.0–36.0)
MCHC: 32.9 g/dL (ref 30.0–36.0)
MCV: 88.8 fL (ref 80.0–100.0)
MCV: 86.7 fL (ref 80.0–100.0)
Monocytes Absolute: 0.3 10*3/uL (ref 0.1–1.0)
Monocytes Absolute: 1.2 10*3/uL — ABNORMAL HIGH (ref 0.1–1.0)
Monocytes Relative: 1 %
Monocytes Relative: 4 %
Neutro Abs: 30.8 10*3/uL — ABNORMAL HIGH (ref 1.7–7.7)
Neutro Abs: 27.6 10*3/uL — ABNORMAL HIGH (ref 1.7–7.7)
Neutrophils Relative %: 96 %
Neutrophils Relative %: 89 %
Platelets: 770 10*3/uL — ABNORMAL HIGH (ref 150–400)
Platelets: 747 10*3/uL — ABNORMAL HIGH (ref 150–400)
RBC: 4.46 MIL/uL (ref 4.22–5.81)
RBC: 4.07 MIL/uL — ABNORMAL LOW (ref 4.22–5.81)
RDW: 15.7 % — ABNORMAL HIGH (ref 11.5–15.5)
RDW: 15.7 % — ABNORMAL HIGH (ref 11.5–15.5)
WBC: 32.1 10*3/uL — ABNORMAL HIGH (ref 4.0–10.5)
WBC: 31 10*3/uL — ABNORMAL HIGH (ref 4.0–10.5)
nRBC: 0 /100 WBC
nRBC: 0.1 % (ref 0.0–0.2)
nRBC: 0 % (ref 0.0–0.2)

## 2021-01-10 LAB — CULTURE, BLOOD (ROUTINE X 2): Special Requests: ADEQUATE

## 2021-01-10 LAB — GLUCOSE, CAPILLARY
Glucose-Capillary: 126 mg/dL — ABNORMAL HIGH (ref 70–99)
Glucose-Capillary: 134 mg/dL — ABNORMAL HIGH (ref 70–99)
Glucose-Capillary: 138 mg/dL — ABNORMAL HIGH (ref 70–99)
Glucose-Capillary: 147 mg/dL — ABNORMAL HIGH (ref 70–99)
Glucose-Capillary: 150 mg/dL — ABNORMAL HIGH (ref 70–99)
Glucose-Capillary: 158 mg/dL — ABNORMAL HIGH (ref 70–99)
Glucose-Capillary: 169 mg/dL — ABNORMAL HIGH (ref 70–99)

## 2021-01-10 LAB — VANCOMYCIN, TROUGH: Vancomycin Tr: 9 ug/mL — ABNORMAL LOW (ref 15–20)

## 2021-01-10 LAB — PHOSPHORUS
Phosphorus: 3.6 mg/dL (ref 2.5–4.6)
Phosphorus: 2.8 mg/dL (ref 2.5–4.6)

## 2021-01-10 LAB — MAGNESIUM
Magnesium: 1.9 mg/dL (ref 1.7–2.4)
Magnesium: 1.7 mg/dL (ref 1.7–2.4)

## 2021-01-10 MED ORDER — SODIUM CHLORIDE 0.9 % IV SOLN: INTRAVENOUS | Status: DC

## 2021-01-10 MED ORDER — MAGNESIUM SULFATE 2 GM/50ML IV SOLN
2.0000 g | Freq: Once | INTRAVENOUS | Status: AC
  Administered 2021-01-10: 2 g via INTRAVENOUS
  Filled 2021-01-10: qty 50

## 2021-01-10 MED ORDER — VANCOMYCIN HCL 1250 MG/250ML IV SOLN
1250.0000 mg | Freq: Three times a day (TID) | INTRAVENOUS | Status: DC
  Administered 2021-01-10 – 2021-01-12 (×6): 1250 mg via INTRAVENOUS
  Filled 2021-01-10 (×7): qty 250

## 2021-01-10 MED ORDER — ALUM & MAG HYDROXIDE-SIMETH 200-200-20 MG/5ML PO SUSP: 30.0000 mL | Freq: Four times a day (QID) | ORAL | Status: DC | PRN

## 2021-01-10 NOTE — Progress Notes (Signed)
Cardiology Progress Note  Patient ID: Louis [name] MRN: [id number] DOB: Aug 27, 1965 Date of Encounter: 01/10/2021  Primary Cardiologist: None  Subjective   Chief Complaint: Back/Hip pain  HPI: Troponin negative. Echo without WMA. No STEMI.   ROS:  All other ROS reviewed and negative. Pertinent positives noted in the HPI.     Inpatient Medications  Scheduled Meds:  aspirin EC  81 mg Oral Daily   atorvastatin  40 mg Oral QHS   enoxaparin (LOVENOX) injection  40 mg Subcutaneous QHS   fluconazole  100 mg Oral Daily   gabapentin  200 mg Oral TID   insulin aspart  0-9 Units Subcutaneous Q4H   lactulose  10 g Oral Daily   losartan  25 mg Oral Daily   metoprolol succinate  25 mg Oral Daily   nystatin  5 mL Oral QID   oxyCODONE  20 mg Oral Q12H   pantoprazole  40 mg Oral Daily   polyethylene glycol  17 g Oral BID   venlafaxine XR  75 mg Oral q morning   Continuous Infusions:  sodium chloride     sodium chloride     vancomycin 1,500 mg (01/09/21 1840)   PRN Meds: acetaminophen **OR** acetaminophen, ALPRAZolam, alum & mag hydroxide-simeth, diclofenac Sodium, HYDROmorphone (DILAUDID) injection, labetalol, lidocaine, ondansetron **OR** ondansetron (ZOFRAN) IV   Vital Signs   Vitals:   01/10/21 0200 01/10/21 0400 01/10/21 0651 01/10/21 0700  BP: (!) 149/101 (!) 136/103  (!) 146/85  Pulse: (!) 110 (!) 108 (!) 111 (!) 111  Resp: 20 19 20 19   Temp:  (!) 97.2 F (36.2 C)  98.2 F (36.8 C)  TempSrc:  Oral  Oral  SpO2: 96% 96% 93% 98%  Weight:        Intake/Output Summary (Last 24 hours) at 01/10/2021 0932 Last data filed at 01/10/2021 0000 Gross per 24 hour  Intake 108.4 ml  Output 1500 ml  Net -1391.6 ml   Last 3 Weights 01/09/2021 12/25/2020 12/24/2020  Weight (lbs) 204 lb 12.9 oz 215 lb 2.7 oz 207 lb 10.8 oz  Weight (kg) 92.9 kg 97.6 kg 94.2 kg      Telemetry  Overnight telemetry shows ST 120s, which I personally reviewed.   Physical Exam   Vitals:   01/10/21 0200  01/10/21 0400 01/10/21 0651 01/10/21 0700  BP: (!) 149/101 (!) 136/103  (!) 146/85  Pulse: (!) 110 (!) 108 (!) 111 (!) 111  Resp: 20 19 20 19   Temp:  (!) 97.2 F (36.2 C)  98.2 F (36.8 C)  TempSrc:  Oral  Oral  SpO2: 96% 96% 93% 98%  Weight:        Intake/Output Summary (Last 24 hours) at 01/10/2021 0932 Last data filed at 01/10/2021 0000 Gross per 24 hour  Intake 108.4 ml  Output 1500 ml  Net -1391.6 ml    Last 3 Weights 01/09/2021 12/25/2020 12/24/2020  Weight (lbs) 204 lb 12.9 oz 215 lb 2.7 oz 207 lb 10.8 oz  Weight (kg) 92.9 kg 97.6 kg 94.2 kg    Body mass index is 28.56 kg/m.   General: ill-appearing  Head: Atraumatic, normal size  Eyes: PEERLA, EOMI  Neck: Supple, no JVD Endocrine: No thryomegaly Cardiac: Normal S1, S2; tachycardia, no m/r/g Lungs: Clear to auscultation bilaterally, no wheezing, rhonchi or rales  Abd: Soft, nontender, no hepatomegaly  Ext: No edema, pulses 2+ Musculoskeletal: No deformities Skin: Warm and dry, no rashes   Neuro: Alert and oriented to person, place, time, and  situation, CNII-XII grossly intact, no focal deficits  Psych: Normal mood and affect   Labs  High Sensitivity Troponin:   Recent Labs  Lab 12/23/20 1726 01/09/21 1026 01/09/21 1253  TROPONINIHS 30* 24* 31*     Cardiac EnzymesNo results for input(s): TROPONINI in the last 168 hours. No results for input(s): TROPIPOC in the last 168 hours.  Chemistry Recent Labs  Lab 01/08/21 0303 01/09/21 0105 01/09/21 2141  NA 135 131* 132*  K 3.7 3.3* 4.2  CL 99 96* 97*  CO2 26 27 23   GLUCOSE 180* 195* 150*  BUN 8 <5* 6  CREATININE 0.65 0.51* 0.53*  CALCIUM 8.3* 7.9* 8.2*  PROT 5.6* 5.7* 6.7  ALBUMIN 1.6* 1.5* 1.7*  AST 58* 57* 34  ALT 29 32 29  ALKPHOS 123 122 132*  BILITOT 0.7 0.6 1.2  GFRNONAA >60 >60 >60  ANIONGAP 10 8 12     Hematology Recent Labs  Lab 01/08/21 0303 01/09/21 0105 01/09/21 2141  WBC 27.4* 30.8* 32.1*  RBC 3.78* 3.73* 4.46  HGB 10.9* 10.6* 12.9*   HCT 32.3* 32.7* 39.6  MCV 85.4 87.7 88.8  MCH 28.8 28.4 28.9  MCHC 33.7 32.4 32.6  RDW 16.0* 15.8* 15.7*  PLT 812* 791* 770*   BNPNo results for input(s): BNP, PROBNP in the last 168 hours.  DDimer No results for input(s): DDIMER in the last 168 hours.   Radiology  ECHOCARDIOGRAM LIMITED  Result Date: 01/09/2021    ECHOCARDIOGRAM LIMITED REPORT   Patient Name:   Louis [name]  Date of Exam: 01/09/2021 Medical Rec #:  Louis Moll  Height:       71.0 in Accession #:    01/11/2021 Weight:       215.2 lb Date of Birth:  1966-04-16   BSA:          2.175 m Patient Age:    55 years   BP:           153/88 mmHg Patient Gender: M          HR:           102 bpm. Exam Location:  Inpatient Procedure: Limited Color Doppler, Cardiac Doppler and Limited Echo STAT ECHO Indications:    abnormal ecg  History:        Patient has prior history of Echocardiogram examinations, most                 recent 01/07/2021. CHF, CAD, Signs/Symptoms:Bacteremia; Risk                 Factors:Hypertension.  Sonographer:    10/25/1965 Referring Phys: 01/09/2021  THOMAS O'NEAL IMPRESSIONS  1. Left ventricular ejection fraction, by estimation, is 55 to 60%. The left ventricle has normal function. The left ventricle has no regional wall motion abnormalities. There is mild concentric left ventricular hypertrophy.  2. Right ventricular systolic function is normal. The right ventricular size is normal.  3. The mitral valve is grossly normal. Trivial mitral valve regurgitation. No evidence of mitral stenosis.  4. The aortic valve is grossly normal. Aortic valve regurgitation is not visualized. No aortic stenosis is present.  5. The inferior vena cava is normal in size with greater than 50% respiratory variability, suggesting right atrial pressure of 3 mmHg. Comparison(s): No significant change from prior study. EF unchanged. No WMA. FINDINGS  Left Ventricle: Left ventricular ejection fraction, by estimation, is 55 to 60%. The left ventricle  has normal function. The left ventricle has no regional  wall motion abnormalities. The left ventricular internal cavity size was normal in size. There is  mild concentric left ventricular hypertrophy. Abnormal (paradoxical) septal motion consistent with post-operative status. Right Ventricle: The right ventricular size is normal. No increase in right ventricular wall thickness. Right ventricular systolic function is normal. Mitral Valve: The mitral valve is grossly normal. Trivial mitral valve regurgitation. No evidence of mitral valve stenosis. Tricuspid Valve: The tricuspid valve is grossly normal. Tricuspid valve regurgitation is not demonstrated. No evidence of tricuspid stenosis. Aortic Valve: The aortic valve is grossly normal. Aortic valve regurgitation is not visualized. No aortic stenosis is present. Pulmonic Valve: The pulmonic valve was not well visualized. Aorta: The aortic root is normal in size and structure. Venous: The inferior vena cava is normal in size with greater than 50% respiratory variability, suggesting right atrial pressure of 3 mmHg. LEFT VENTRICLE PLAX 2D LVIDd:         4.80 cm     Diastology LVIDs:         3.70 cm     LV e' medial:  6.09 cm/s LV PW:         1.20 cm     LV e' lateral: 18.60 cm/s LV IVS:        1.30 cm  LV Volumes (MOD) LV vol d, MOD A4C: 82.4 ml LV vol s, MOD A4C: 46.0 ml LV SV MOD A4C:     82.4 ml AORTIC VALVE LVOT Vmax:   101.00 cm/s LVOT Vmean:  66.700 cm/s LVOT VTI:    0.156 m  SHUNTS Systemic VTI: 0.16 m Lennie OdorWesley O'Neal MD Electronically signed by Lennie OdorWesley O'Neal MD Signature Date/Time: 01/09/2021/12:55:44 PM    Final     Cardiac Studies  TTE 01/09/2021  1. Left ventricular ejection fraction, by estimation, is 55 to 60%. The  left ventricle has normal function. The left ventricle has no regional  wall motion abnormalities. There is mild concentric left ventricular  hypertrophy.   2. Right ventricular systolic function is normal. The right ventricular  size is  normal.   3. The mitral valve is grossly normal. Trivial mitral valve  regurgitation. No evidence of mitral stenosis.   4. The aortic valve is grossly normal. Aortic valve regurgitation is not  visualized. No aortic stenosis is present.   5. The inferior vena cava is normal in size with greater than 50%  respiratory variability, suggesting right atrial pressure of 3 mmHg.   LHC 09/03/2018  1. Hemodynamics: Aortic pressure 127/57, LVEDP 20  2. Coronary system:   Left Main: Large caliber vessel with no angiographic evidence of  stenosis.   LAD system: Large caliber vessel, wraps around the apex. subtotaled in  the mid portion with competitive flow from the LIMA stenosis   LCX system: Moderate caliber vessel.  Competitive flow from SVG graft  distally.   RCA system: right dominant. chronically occluded proximally.   Conclusion:  Severe native vessel CAD as mentioned above.  3/3 grafts are patent  Mildly elevated LVEDP  Patient Profile  Louis [name] is a 55 y.o. male with CAD s/p CABG, HTN, spinal discitis/osteo/epidural abscess (laminectomy/evacuation 12/21/2020), MRSA bacteremia who was admitted 01/06/2021 with back pain and found to have worsening discitis/osteomyelitis and abscess of the thoracic/lumbar spine. Cardiology was consulted 7/17 for ST elevation on EKG without symptoms of chest pain.   Assessment & Plan   Abnormal EKG/ST elevation in V1-2 without reciprocal changes -seen yesterday. Had ST elevation on monitor and EKG showed sinus tachycardia  with 1-2 mm isolated ST elevation in V1-2. No CP. No reciprocal changes. Troponin negative. Echo shows normal LV function and no WMA.  -no STEMI -baseline EKG has minimal ST elevation in these leads and more pronounced in setting of sinus tachycardia.  -no further cardiac work-up.   2. CAD s/p CABG -recent cath in 2020 with patent grafts -no current issues -continue cardiac meds as able  3. MRSA Bacteremia -persistent infection and  bacteremia. Suspect will need TEE at some point. Probably needs better source control. Defer to ID.   [facility] will sign off.   Medication Recommendations: as above  Other recommendations (labs, testing, etc):  none Follow up as an outpatient:  he may keep his regular cardiology follow-up with his cardiologist   For questions or updates, please contact [facility] Please consult [email or web address] for contact info under   Time Spent with Patient: I have spent a total of 35 minutes with patient reviewing hospital notes, telemetry, EKGs, labs and examining the patient as well as establishing an assessment and plan that was discussed with the patient.  > 50% of time was spent in direct patient care.    Signed, Lenna Gilford. Flora Lipps, MD, [facility]  01/10/2021 9:32 AM

## 2021-01-10 NOTE — H&P (View-Only) (Signed)
PROGRESS NOTE    Louis [name]  VZD:[id number] DOB: 1965/08/06 DOA: 01/05/2021 PCP: Ray Church, NP   Brief Narrative:  Patient is a 55 year old Caucasian male with a past medical history significant for but not limited to type 2 diabetes mellitus, hypertension, history of CAD with CABG in 2017, anxiety, chronic pain as well as other comorbidities with recent admission with an epidural abscess, vertebral osteomyelitis, psoas abscess and MRSA bacteremia now returning to the ED with worsening back pain.  He reported that his back pain has been stable until approximately 5 days ago when it became more severe and progressively worsened since then.  He did not notice any new leg numbness or weakness did have some urinary incontinence.  Back pain was reportedly severe and constant worse with movement and radiating to both his legs.  He denied any fevers or chills but stated he did have some mild drainage from his surgical wounds.  He missed his dose of antibiotics yesterday but has not missed any other doses.  He been admitted on 12/12/2020 after he is found to have an epidural abscess and vertebral osteomyelitis and after over there is no neurosurgery or ID backup.  He underwent a laminectomy and evacuation of abscess on 12/13/2020 and then drainage of the psoas abscess by IR on 12/22/2020.  Blood cultures from 12/14/2018 grew out MRSA but there is no vegetation seen on TTE and blood cultures were drawn and repeated 12/15/2020 remain negative.  He has been on daptomycin at home.  Upon arrival to ED is found to be saturating well on room air but he did have a leukocytosis 23,900 and platelet count of 681,000.  COVID PCR was negative.  He had an MRI of the thoracic and lumbar spine which revealed multiple fluid collections involving the ventral epidural abscess at the L4-L5 level with progressive discitis and osteomyelitis at the L4-L5 and L5-S1 levels.  Blood cultures were repeated in the ED and he was given multiple  doses of IV analgesics and he is evaluated by neurosurgery who recommended medical admission.  Initial plan was for lumbar decompression however Dr. Reatha Armour of neurosurgery evaluated and felt that his thoracic mass-effect was improved and that even though there was persistent ostial discitis and epidural abscess throughout the thoracolumbar spine there is possibly a more loculated focus of the abscess at L4-L5 with stenosis but no focal weakness, groin numbness or numbness and tingling in his legs he did not believe that urgent or emergent decompression was needed at this time.  Patient has been confused more often per his wife and neurosurgery is obtaining MRI of the brain with and without contrast given concern of some cerebral spread of infection.  Because he has failed multiple medical treatments will likely need broaden antibiotic therapy and will consult ID and neurosurgery feels that the stenosis L4-L5 could be decompressed however neurologically patient is doing well with no focal deficits and therefore they are recommending aggressive medical and supportive care.  ID Changed to IV Vancomycin and consulted IR for drainage of the deep soft tissue collection with peripheral enhancement measuring 4.7 x 5.0 x 1.7 cm.  The image guided drainage was done today by Dr. Vernard Gambles.  Dr. Reatha Armour of neurosurgery recommending continuing vancomycin and aggressive medical care.  Patient has some nausea today and an abnormal EKG so cardiology was consulted for further evaluation and stat troponins were ordered and they were negative.  Cardiology evaluated and felt that we should repeat a limited echo.  Limited echo  done and showed no wall motion abnormalities.  There is no evidence of STEMI.   The patient is to undergo a TEE on Tuesday, 01/11/2021.  ID recommending following blood culture susceptibility.  Per ID if WBC remains high and the susceptibility indicates daptomycin remain sensitive they are recommending that we  need to discuss with neurosurgery given regarding debridement for source control.  Assessment & Plan:   Principal Problem:   MRSA bacteremia Active Problems:   CAD (coronary artery disease)   HTN (hypertension)   Uncontrolled diabetes mellitus (HCC)   Epidural abscess   Normocytic anemia   Hyponatremia   Anxiety   Chronic diastolic CHF (congestive heart failure) (HCC)  Sepsis secondary to epidural abscess; discitis/osteomyelitis; MRSA bacteremia     -Patient met sepsis physiology on admission as he was tachycardic, tachypneic, had a leukocytosis and a source of Infection  -Pt with laminectomy & evacuation of abscess 12/21/20, IR drainage of psoas abscess 6/29, and MRSA bacteremia currently on daptomycin presents with worsening back pain and found on MRI L- and T-spine to have multiple fluid collections including ventral epidural abscess and progressive osteomyelitis at L 4-5 -Blood cultures grew MRSA on 6/20, were negative 6/22, no vegetation seen on TTE 12/15/20  - Neurosurgery was planning for L4-5 decompression 01/06/21 but re-evaluated and since patient is Neurologically intact will hold off and treat medically do not feel that urgent or emergent decompression is needed at this time -Give another 1-1/2 L bolus today and start the patient on sodium bicarb at 75 MLS per hour -Patient continues to be tachycardic and will continue with metoprolol tartrate 25 mg p.o. nightly and also start labetalol IV -WBC is gone from 23.9 is now 31.7 -> 30.5 and is further improved to 27.4 yesterday but bumped up the day before yesterday. WBC remains elevated and went from 32.1 -> 21.0 -CRP went from 31.1 -> 27.8 and is further improved to 21.8 x2 -Continue IV antibiotics, and pain-control  -Check a UDS -We will formally consult ID for further evaluation recommendations; ID recommending stopping daptomycin and switching to vancomycin per pharmacy to dose.  We will order a TTE and if this is negative  proceeding with a TEE and I had called [facility] and they are supposedly scheduling the patient down for Monday -ID is also ordered a CT of the pelvis with contrast to further evaluate reactivation of psoas abscess and they are repeating blood cultures and removed his PICC line; CT of the pelvis showed "No pelvic abscess or acute osseous abnormality identified. Trace free fluid in the deep pelvis, likely related to anasarca. Subtle residual of the left psoas abscess at the L5 level which was drained last month. Infected lumbar levels minimally included on this exam. See Lumbar MRI 01/05/2021." -ID is now consulted interventional radiology for the fluid collection mentioned in the deep subdural soft tissue measuring 4.7 x 5.0 x 1.7 cm; patient underwent drainage today of this collection by Dr. Vernard Gambles -ID is now also sending susceptibility testing to daptomycin, ceftrarloibne and linezolid; ID is recommending following up on blood culture susceptibility and thinks that the blood pressure remains high and the susceptibility indicates daptomycin remain sensitive that we will need to discuss with neurosurgery again regarding debridement for source control -Cardiology been consulted for TEE and this likely is going to be done Tuesday    Confusion, in the setting of infection -Neurosurgery is obtaining a MRI of the brain with and without contrast given that he does have an  extensive epidural abscess the spine with encephalopathy -MRI Brain showed "Significantly motion degraded examination, as described and limiting evaluation.   Within this limitation, no definite acute intracranial abnormality is identified. Mild generalized parenchymal atrophy and cerebral white matter chronic small vessel ischemic disease. Mild paranasal sinus disease, as described" -Patient is improving but still having a lot of pain   Unwitnessed Fall -[place] sometime early yesterday morning -Repeat head CT showed no acute intracranial  abnormalities noted -Fall Precautions    Normocytic Anemia  - Hgb is 8.6, down from 11.0 on 12/29/20; repeat hemoglobin/hematocrit is now 12.4/38.0 -> 11.4/35.5 -> 10.9/32.3 -> 10.6/32.7 -> 12.9/39.6 -> 11.6/35.3 - No overt bleeding  -Checked anemia panel and showed iron level of 19, U IBC 139, TIBC 158, saturation ratios of 12%, ferritin level 864, folate of 2.5, vitamin B12 level 1070 -Continue to monitor for signs and symptoms of bleeding -Repeat CBC in a.m.   Type II DM  - A1c was 9.1% in June 2022  - Check CBGs and use SSI for now ; CBGs ranging from 134-169   CAD status post CABG - No anginal complaints -He had a LIMA to LAD, vein graft to distal circumflex and vein graft RCA in 2017. -His catheterization in March 2020 demonstrated patent vein grafts - Held ASA preoperatively however if he is not going to surgery we will resume, continue atorvastatin 40 mg nightly and beta-blocker with metoprolol tartrate 25 mg nightly; all cardiac medications very resumed -Had Nausea today so checked Troponin given Abnormal EKG and Tropon went from 24 -> 31 -Cardiology was consulted and evaluated and checked a limited echo  Abnormal EKG -Patient denied Chest Pain but had Nausea but no vomiting -Had ST Elevation in leads V1-V2 without Reciprocal changes -Troponin Checked and went from 24 -> 31 -Cardiology Consulted and checked Limited ECHO -ECHO showed now [facility] -Cardiology does not feel he has a STEMI and thinks that his ST elevations are more pronounced in the setting of pain and hypertension   Hyponatremia  - Serum sodium 129 on admission and is improved to 135 but is now back down to 131 -> 132 -> 129 -Appears chronic, possibly related to his SNRI   -Checked urine sodium was 14 and Urine Osm was 484 -Continue monitor and trend sodium level  Thrombocytosis -Likely reactive in the setting of infection -The patient's platelet count was 681 is now trended down to 457 but has trended back up  and today is now 791 -> 770 -> 747 -Continue monitor and trend and repeat   Elevated anion metabolic acidosis -His anion gap acidosis is now improved and he has a CO2 of 25, chloride level of 93, anion gap of 11 -He was started on sodium bicarbonate drip and this has been discontinued and now also stopped NS  Hypomagnesemia -Mag Level was 1.5 and improved and is now 1.7 -Replete with IV Mag Sulfate 2 Grams -Continue to Monitor and Replete as Necessary -Repeat Mag Level in the AM   Hypokalemia -Patient's K+ Level is now 3.9 -Continue to Monitor and Replete as Necessary -Repeat CMP in the AM   Oral Thrush -Start nystatin swish and spit and also obtain fluconazole consult per pharmacy   GERD -Continue with Pantoprazole 40 mg p.o. daily  Hyperbilirubinemia -Likely reactive -Patient's T bili and is now 1.5 -> 0.7 and today is 0.6 -> 1.2 -> 1.0 -Continue to Monitor and trend and repeat CMP in the a.m.   Chronic Diastolic CHF  -Appears hypovolemic  on admission  -Hold Lasix, started gentle IVF hydration, monitor volume status   -Was given 1.5 L yesterday and started on maintenance IV fluids with sodium bicarb and given his metabolic acidosis; Now will stop IVF with Sodium Bicarbonate and start NS at 75 mL/hr.  Normal saline is to be discontinued today -Strict I's and O's and daily weights: Patient is - 4.179 liters since admission -Continue to Monitor Volume Status Carefully  Normocytic Anemia -Patient's Hgb/Hct 12.4/38.0 -> 11.4/35.5 -> 10.9/32.3 -> 10.6/32.7 -> 12.9/39.6 -> 11.6/35.3 -Likley Dilutional Drop -Continue to Monitor for S/Sx of Bleeding; Currently no overt bleeding noted -Repeat CBC in the AM   Anxiety  - Continue venlafaxine XR 25 mg daily and as-needed Xanax  DVT prophylaxis: SCDs for now and will start Lovenox Code Status: FULL CODE Family Communication: Discussed with wife at bedside  Disposition Plan: Pending further clinical improvement and clearance by  specialists  Status is: Inpatient  Remains inpatient appropriate because:Unsafe d/c plan, IV treatments appropriate due to intensity of illness or inability to take PO, and Inpatient level of care appropriate due to severity of illness  Dispo: The patient is from: Home              Anticipated d/c is to: Home              Patient currently is not medically stable to d/c.   Difficult to place patient No  Consultants:  Neurosurgery ID IR Cardiology  Procedures:  ECHOCARDIOGRAM IMPRESSIONS     1. Left ventricular ejection fraction, by estimation, is 55 to 60%. The  left ventricle has normal function. The left ventricle has no regional  wall motion abnormalities. There is moderate left ventricular hypertrophy.  Left ventricular diastolic  parameters are indeterminate.   2. Right ventricular systolic function is normal. The right ventricular  size is normal.   3. The mitral valve is normal in structure. No evidence of mitral valve  regurgitation. No evidence of mitral stenosis.   4. The aortic valve is tricuspid. Aortic valve regurgitation is not  visualized. No aortic stenosis is present.   5. The inferior vena cava is dilated in size with >50% respiratory  variability, suggesting right atrial pressure of 8 mmHg.   FINDINGS   Left Ventricle: Left ventricular ejection fraction, by estimation, is 55  to 60%. The left ventricle has normal function. The left ventricle has no  regional wall motion abnormalities. The left ventricular internal cavity  size was normal in size. There is   moderate left ventricular hypertrophy. Left ventricular diastolic  parameters are indeterminate.   Right Ventricle: The right ventricular size is normal. Right ventricular  systolic function is normal.   Left Atrium: Left atrial size was normal in size.   Right Atrium: Right atrial size was normal in size.   Pericardium: There is no evidence of pericardial effusion.   Mitral Valve: The mitral  valve is normal in structure. No evidence of  mitral valve regurgitation. No evidence of mitral valve stenosis.   Tricuspid Valve: The tricuspid valve is normal in structure. Tricuspid  valve regurgitation is trivial. No evidence of tricuspid stenosis.   Aortic Valve: The aortic valve is tricuspid. Aortic valve regurgitation is  not visualized. No aortic stenosis is present. Aortic valve mean gradient  measures 2.0 mmHg. Aortic valve peak gradient measures 3.1 mmHg.   Pulmonic Valve: The pulmonic valve was normal in structure. Pulmonic valve  regurgitation is not visualized. No evidence of pulmonic stenosis.  Aorta: The aortic root is normal in size and structure.   Venous: The inferior vena cava is dilated in size with greater than 50%  respiratory variability, suggesting right atrial pressure of 8 mmHg.   IAS/Shunts: No atrial level shunt detected by color flow Doppler.      LEFT VENTRICLE  PLAX 2D  LVIDd:         4.00 cm  LVIDs:         3.40 cm  LV PW:         1.60 cm  LV IVS:        1.50 cm  LVOT diam:     2.50 cm  LV SV:         79  LV SV Index:   36  LVOT Area:     4.91 cm      RIGHT VENTRICLE             IVC  RV Basal diam:  3.30 cm     IVC diam: 2.20 cm  RV S prime:     13.40 cm/s  TAPSE (M-mode): 1.3 cm   LEFT ATRIUM             Index       RIGHT ATRIUM           Index  LA diam:        3.50 cm 1.61 cm/m  RA Area:     14.90 cm  LA Vol (A2C):   69.4 ml 31.91 ml/m RA Volume:   36.10 ml  16.60 ml/m  LA Vol (A4C):   51.3 ml 23.59 ml/m  LA Biplane Vol: 62.6 ml 28.78 ml/m   AORTIC VALVE  AV Area (Vmax):    6.50 cm  AV Area (Vmean):   5.74 cm  AV Area (VTI):     5.53 cm  AV Vmax:           87.60 cm/s  AV Vmean:          59.600 cm/s  AV VTI:            0.142 m  AV Peak Grad:      3.1 mmHg  AV Mean Grad:      2.0 mmHg  LVOT Vmax:         116.00 cm/s  LVOT Vmean:        69.700 cm/s  LVOT VTI:          0.160 m  LVOT/AV VTI ratio: 1.13     AORTA  Ao  Root diam: 3.50 cm  Ao Asc diam:  3.60 cm      SHUNTS  Systemic VTI:  0.16 m  Systemic Diam: 2.50 cm   Antimicrobials:  Anti-infectives (From admission, onward)    Start     Dose/Rate Route Frequency Ordered Stop   01/10/21 1200  vancomycin (VANCOREADY) IVPB 1250 mg/250 mL        1,250 mg 166.7 mL/hr over 90 Minutes Intravenous Every 8 hours 01/10/21 1037     01/09/21 1900  fluconazole (DIFLUCAN) tablet 100 mg        100 mg Oral Daily 01/09/21 1811 01/16/21 0959   01/07/21 0500  vancomycin (VANCOREADY) IVPB 1500 mg/300 mL  Status:  Discontinued        1,500 mg 150 mL/hr over 120 Minutes Intravenous Every 12 hours 01/06/21 1520 01/10/21 1037   01/06/21 2000  DAPTOmycin (CUBICIN) 750 mg in sodium chloride 0.9 % IVPB  Status:  Discontinued        750 mg 130 mL/hr over 30 Minutes Intravenous Daily 01/06/21 0237 01/06/21 1520   01/06/21 1700  vancomycin (VANCOREADY) IVPB 2000 mg/400 mL        2,000 mg 200 mL/hr over 120 Minutes Intravenous  Once 01/06/21 1520 01/06/21 1848        Subjective: Seen and examined at bedside he is complaining of significant more pain today and resting.  Denies any chest pain, lightheadedness or dizziness.  White blood cell count remains elevated.  Patient is going for TEE on Tuesday. No other concerns or complaints at this time except increased back pain.   Objective: Vitals:   01/10/21 0400 01/10/21 0651 01/10/21 0700 01/10/21 1130  BP: (!) 136/103  (!) 146/85 137/87  Pulse: (!) 108 (!) 111 (!) 111 (!) 108  Resp: $Remo'19 20 19 20  'MfnJq$ Temp: (!) 97.2 F (36.2 C)  98.2 F (36.8 C) 98.4 F (36.9 C)  TempSrc: Oral  Oral Oral  SpO2: 96% 93% 98% 94%  Weight:        Intake/Output Summary (Last 24 hours) at 01/10/2021 1553 Last data filed at 01/10/2021 1231 Gross per 24 hour  Intake 228.4 ml  Output 2050 ml  Net -1821.6 ml    Filed Weights   01/09/21 1700  Weight: 92.9 kg   Examination: Physical Exam:  Constitutional: Chronically ill-appearing  Caucasian male currently in mild distress appears uncomfortable complaining about some back pain again  Eyes: Lids and conjunctivae normal, sclerae anicteric  ENMT: External Ears, Nose appear normal. Grossly normal hearing.  Neck: Appears normal, supple, no cervical masses, normal ROM, no appreciable thyromegaly; no appreciable JVD Respiratory: Diminished to auscultation bilaterally with coarse breath sounds, no wheezing, rales, rhonchi or crackles. Normal respiratory effort and patient is not tachypenic. No accessory muscle use.  Unlabored breathing Cardiovascular: Tachycardic rate but regular rhythm., no murmurs / rubs / gallops. S1 and S2 auscultated.  Has some trace edema Abdomen: Soft, non-tender, distended secondary to body habitus. Bowel sounds positive.  GU: Deferred. Musculoskeletal: No clubbing / cyanosis of digits/nails. No joint deformity upper and lower extremities.  Skin: No rashes, lesions, ulcers on limited skin evaluation. No induration; Warm and dry.  Neurologic: CN 2-12 grossly intact with no focal deficits. Romberg sign and cerebellar reflexes not assessed.  Psychiatric: Normal judgment and insight.  He is a little somnolent and drowsy but easily arousable. Normal mood and appropriate affect.   Data Reviewed: I have personally reviewed following labs and imaging studies  CBC: Recent Labs  Lab 01/07/21 0652 01/08/21 0303 01/09/21 0105 01/09/21 2141 01/10/21 1001  WBC 30.5* 27.4* 30.8* 32.1* 31.0*  NEUTROABS 25.8* 22.1* 26.6* 30.8* 27.6*  HGB 11.4* 10.9* 10.6* 12.9* 11.6*  HCT 35.5* 32.3* 32.7* 39.6 35.3*  MCV 90.3 85.4 87.7 88.8 86.7  PLT 863* 812* 791* 770* 747*    Basic Metabolic Panel: Recent Labs  Lab 01/07/21 0652 01/08/21 0303 01/09/21 0105 01/09/21 2141 01/10/21 1001  NA 134* 135 131* 132* 129*  K 4.4 3.7 3.3* 4.2 3.9  CL 100 99 96* 97* 93*  CO2 14* $Remov'26 27 23 25  'UwhieJ$ GLUCOSE 138* 180* 195* 150* 142*  BUN 9 8 <5* 6 5*  CREATININE 0.96 0.65 0.51* 0.53*  0.58*  CALCIUM 8.5* 8.3* 7.9* 8.2* 8.1*  MG 1.6* 1.8 1.5* 1.9 1.7  PHOS 4.0 3.1 2.7 3.6 2.8    GFR: Estimated Creatinine Clearance: 118.6 mL/min (A) (by C-G formula based on SCr of 0.58  mg/dL (L)). Liver Function Tests: Recent Labs  Lab 01/07/21 0652 01/08/21 0303 01/09/21 0105 01/09/21 2141 01/10/21 1001  AST 21 58* 57* 34 23  ALT 16 29 32 29 22  ALKPHOS 127* 123 122 132* 124  BILITOT 1.5* 0.7 0.6 1.2 1.0  PROT 6.1* 5.6* 5.7* 6.7 6.0*  ALBUMIN 1.7* 1.6* 1.5* 1.7* 1.5*    No results for input(s): LIPASE, AMYLASE in the last 168 hours. No results for input(s): AMMONIA in the last 168 hours. Coagulation Profile: No results for input(s): INR, PROTIME in the last 168 hours. Cardiac Enzymes: Recent Labs  Lab 01/06/21 0310  CKTOTAL 32*    BNP (last 3 results) No results for input(s): PROBNP in the last 8760 hours. HbA1C: No results for input(s): HGBA1C in the last 72 hours. CBG: Recent Labs  Lab 01/09/21 2046 01/10/21 0013 01/10/21 0427 01/10/21 0738 01/10/21 1128  GLUCAP 174* 158* 169* 147* 134*    Lipid Profile: No results for input(s): CHOL, HDL, LDLCALC, TRIG, CHOLHDL, LDLDIRECT in the last 72 hours. Thyroid Function Tests: No results for input(s): TSH, T4TOTAL, FREET4, T3FREE, THYROIDAB in the last 72 hours. Anemia Panel: No results for input(s): VITAMINB12, FOLATE, FERRITIN, TIBC, IRON, RETICCTPCT in the last 72 hours.  Sepsis Labs: No results for input(s): PROCALCITON, LATICACIDVEN in the last 168 hours.  Recent Results (from the past 240 hour(s))  Blood culture (routine x 2)     Status: Abnormal (Preliminary result)   Collection Time: 01/05/21  5:20 PM   Specimen: BLOOD  Result Value Ref Range Status   Specimen Description BLOOD SITE NOT SPECIFIED  Final   Special Requests   Final    BOTTLES DRAWN AEROBIC AND ANAEROBIC Blood Culture adequate volume   Culture  Setup Time   Final    GRAM POSITIVE COCCI IN BOTH AEROBIC AND ANAEROBIC  BOTTLES CRITICAL VALUE NOTED.  VALUE IS CONSISTENT WITH PREVIOUSLY REPORTED AND CALLED VALUE.    Culture (A)  Final    METHICILLIN RESISTANT STAPHYLOCOCCUS AUREUS Sent to [facility] for further susceptibility testing. Performed at [facility], [place] [address] [place], [place] [address]    Report Status PENDING  Incomplete   Organism ID, Bacteria METHICILLIN RESISTANT STAPHYLOCOCCUS AUREUS  Final      Susceptibility   Methicillin resistant staphylococcus aureus - MIC*    CIPROFLOXACIN >=8 RESISTANT Resistant     ERYTHROMYCIN >=8 RESISTANT Resistant     GENTAMICIN <=0.5 SENSITIVE Sensitive     OXACILLIN >=4 RESISTANT Resistant     TETRACYCLINE <=1 SENSITIVE Sensitive     VANCOMYCIN 1 SENSITIVE Sensitive     TRIMETH/SULFA >=320 RESISTANT Resistant     CLINDAMYCIN <=0.25 SENSITIVE Sensitive     RIFAMPIN <=0.5 SENSITIVE Sensitive     Inducible Clindamycin NEGATIVE Sensitive     * METHICILLIN RESISTANT STAPHYLOCOCCUS AUREUS  Blood culture (routine x 2)     Status: Abnormal   Collection Time: 01/05/21  6:10 PM   Specimen: BLOOD  Result Value Ref Range Status   Specimen Description BLOOD SITE NOT SPECIFIED  Final   Special Requests   Final    BOTTLES DRAWN AEROBIC AND ANAEROBIC Blood Culture results may not be optimal due to an inadequate volume of blood received in culture bottles   Culture  Setup Time   Final    GRAM POSITIVE COCCI IN BOTH AEROBIC AND ANAEROBIC BOTTLES CRITICAL VALUE NOTED.  VALUE IS CONSISTENT WITH PREVIOUSLY REPORTED AND CALLED VALUE.    Culture (A)  Final  STAPHYLOCOCCUS AUREUS SUSCEPTIBILITIES PERFORMED ON PREVIOUS CULTURE WITHIN THE LAST 5 DAYS. Performed at [facility], [place] [address], [place], [place] [address]    Report Status 01/08/2021 FINAL  Final  Resp Panel by RT-PCR (Flu A&B, Covid) Nasopharyngeal Swab     Status: None   Collection Time: 01/05/21  9:32 PM   Specimen: Nasopharyngeal Swab; Nasopharyngeal(NP) swabs in vial transport medium   Result Value Ref Range Status   SARS Coronavirus 2 by RT PCR NEGATIVE NEGATIVE Final    Comment: (NOTE) SARS-CoV-2 target nucleic acids are NOT DETECTED.  The SARS-CoV-2 RNA is generally detectable in upper respiratory specimens during the acute phase of infection. The lowest concentration of SARS-CoV-2 viral copies this assay can detect is 138 copies/mL. A negative result does not preclude SARS-Cov-2 infection and should not be used as the sole basis for treatment or other patient management decisions. A negative result may occur with  improper specimen collection/handling, submission of specimen other than nasopharyngeal swab, presence of viral mutation(s) within the areas targeted by this assay, and inadequate number of viral copies(<138 copies/mL). A negative result must be combined with clinical observations, patient history, and epidemiological information. The expected result is Negative.  Fact Sheet for Patients:  [employer].au  Fact Sheet for Healthcare Providers:  IncredibleEmployment.be  This test is no t yet approved or cleared by the [place] FDA and  has been authorized for detection and/or diagnosis of SARS-CoV-2 by FDA under an Emergency Use Authorization (EUA). This EUA will remain  in effect (meaning this test can be used) for the duration of the COVID-19 declaration under Section 564(b)(1) of the Act, 21 U.S.C.section 360bbb-3(b)(1), unless the authorization is terminated  or revoked sooner.       Influenza A by PCR NEGATIVE NEGATIVE Final   Influenza B by PCR NEGATIVE NEGATIVE Final    Comment: (NOTE) The Xpert Xpress SARS-CoV-2/FLU/RSV plus assay is intended as an aid in the diagnosis of influenza from Nasopharyngeal swab specimens and should not be used as a sole basis for treatment. Nasal washings and aspirates are unacceptable for Xpert Xpress SARS-CoV-2/FLU/RSV testing.  Fact Sheet for  Patients: EntrepreneurPulse.com.au  Fact Sheet for Healthcare Providers: IncredibleEmployment.be  This test is not yet approved or cleared by the [place] FDA and has been authorized for detection and/or diagnosis of SARS-CoV-2 by FDA under an Emergency Use Authorization (EUA). This EUA will remain in effect (meaning this test can be used) for the duration of the COVID-19 declaration under Section 564(b)(1) of the Act, 21 U.S.C. section 360bbb-3(b)(1), unless the authorization is terminated or revoked.  Performed at [facility], [place] [address], [place], [place] [address]   Culture, blood (routine x 2)     Status: Abnormal   Collection Time: 01/07/21  6:52 AM   Specimen: BLOOD  Result Value Ref Range Status   Specimen Description BLOOD RIGHT ANTECUBITAL  Final   Special Requests   Final    BOTTLES DRAWN AEROBIC AND ANAEROBIC Blood Culture adequate volume   Culture  Setup Time   Final    GRAM POSITIVE COCCI IN CLUSTERS ANAEROBIC BOTTLE ONLY CRITICAL VALUE NOTED.  VALUE IS CONSISTENT WITH PREVIOUSLY REPORTED AND CALLED VALUE.    Culture (A)  Final    STAPHYLOCOCCUS AUREUS SUSCEPTIBILITIES PERFORMED ON PREVIOUS CULTURE WITHIN THE LAST 5 DAYS. Performed at [facility], [place] [address], [place], [place] [address]    Report Status 01/10/2021 FINAL  Final  Culture, blood (routine  x 2)     Status: None (Preliminary result)   Collection Time: 01/07/21  6:52 AM   Specimen: BLOOD RIGHT HAND  Result Value Ref Range Status   Specimen Description BLOOD RIGHT HAND  Final   Special Requests   Final    BOTTLES DRAWN AEROBIC ONLY Blood Culture results may not be optimal due to an inadequate volume of blood received in culture bottles   Culture   Final    NO GROWTH 3 DAYS Performed at [facility], [place] [address] [place], [place] [address]    Report Status PENDING  Incomplete  Aerobic/Anaerobic Culture w Gram Stain (surgical/deep  wound)     Status: None (Preliminary result)   Collection Time: 01/08/21 10:10 AM   Specimen: Wound  Result Value Ref Range Status   Specimen Description WOUND  Final   Special Requests NONE  Final   Gram Stain   Final    ABUNDANT WBC PRESENT, PREDOMINANTLY PMN NO ORGANISMS SEEN    Culture   Final    NO GROWTH 2 DAYS NO ANAEROBES ISOLATED; CULTURE IN PROGRESS FOR 5 DAYS Performed at [facility], [place] [address], [place], [place] [address]    Report Status PENDING  Incomplete  Culture, blood (Routine X 2) w Reflex to ID Panel     Status: None (Preliminary result)   Collection Time: 01/09/21 10:26 AM   Specimen: BLOOD  Result Value Ref Range Status   Specimen Description BLOOD RIGHT ANTECUBITAL  Final   Special Requests   Final    BOTTLES DRAWN AEROBIC AND ANAEROBIC Blood Culture adequate volume   Culture  Setup Time   Final    GRAM POSITIVE COCCI IN CLUSTERS ANAEROBIC BOTTLE ONLY CRITICAL VALUE NOTED.  VALUE IS CONSISTENT WITH PREVIOUSLY REPORTED AND CALLED VALUE. Performed at [facility], [place] [address] [place], [place] [address]    Culture GRAM POSITIVE COCCI  Final   Report Status PENDING  Incomplete  Culture, blood (Routine X 2) w Reflex to ID Panel     Status: None (Preliminary result)   Collection Time: 01/09/21 10:49 AM   Specimen: BLOOD LEFT HAND  Result Value Ref Range Status   Specimen Description BLOOD LEFT HAND  Final   Special Requests   Final    BOTTLES DRAWN AEROBIC AND ANAEROBIC Blood Culture results may not be optimal due to an inadequate volume of blood received in culture bottles   Culture  Setup Time   Final    GRAM POSITIVE COCCI IN CLUSTERS ANAEROBIC BOTTLE ONLY CRITICAL VALUE NOTED.  VALUE IS CONSISTENT WITH PREVIOUSLY REPORTED AND CALLED VALUE. Performed at [facility], [place] [address], [place], [place] [address]    Culture GRAM POSITIVE COCCI  Final   Report Status PENDING  Incomplete     RN Pressure Injury Documentation:      Estimated body mass index is 28.56 kg/m as calculated from the following:   Height as of 12/21/20: $RemoveBef'5\' 11"'WZHGUNUctD$  (1.803 m).   Weight as of this encounter: 92.9 kg.  Malnutrition Type:   Malnutrition Characteristics:   Nutrition Interventions:    Radiology Studies: ECHOCARDIOGRAM LIMITED  Result Date: 01/09/2021    ECHOCARDIOGRAM LIMITED REPORT   Patient Name:   JOAO MCCURDY  Date of Exam: 01/09/2021 Medical Rec #:  [id number]  Height:       71.0 in Accession #:    [id number] Weight:       215.2 lb Date of  Birth:  Nov 07, 1965   BSA:          2.175 m Patient Age:    31 years   BP:           153/88 mmHg Patient Gender: M          HR:           102 bpm. Exam Location:  Inpatient Procedure: Limited Color Doppler, Cardiac Doppler and Limited Echo STAT ECHO Indications:    abnormal ecg  History:        Patient has prior history of Echocardiogram examinations, most                 recent 01/07/2021. CHF, CAD, Signs/Symptoms:Bacteremia; Risk                 Factors:Hypertension.  Sonographer:    Johny Chess Referring Phys: [id number] [place]  1. Left ventricular ejection fraction, by estimation, is 55 to 60%. The left ventricle has normal function. The left ventricle has no regional wall motion abnormalities. There is mild concentric left ventricular hypertrophy.  2. Right ventricular systolic function is normal. The right ventricular size is normal.  3. The mitral valve is grossly normal. Trivial mitral valve regurgitation. No evidence of mitral stenosis.  4. The aortic valve is grossly normal. Aortic valve regurgitation is not visualized. No aortic stenosis is present.  5. The inferior vena cava is normal in size with greater than 50% respiratory variability, suggesting right atrial pressure of 3 mmHg. Comparison(s): No significant change from prior study. EF unchanged. No WMA. FINDINGS  Left Ventricle: Left ventricular ejection fraction, by estimation, is 55 to 60%. The left ventricle has  normal function. The left ventricle has no regional wall motion abnormalities. The left ventricular internal cavity size was normal in size. There is  mild concentric left ventricular hypertrophy. Abnormal (paradoxical) septal motion consistent with post-operative status. Right Ventricle: The right ventricular size is normal. No increase in right ventricular wall thickness. Right ventricular systolic function is normal. Mitral Valve: The mitral valve is grossly normal. Trivial mitral valve regurgitation. No evidence of mitral valve stenosis. Tricuspid Valve: The tricuspid valve is grossly normal. Tricuspid valve regurgitation is not demonstrated. No evidence of tricuspid stenosis. Aortic Valve: The aortic valve is grossly normal. Aortic valve regurgitation is not visualized. No aortic stenosis is present. Pulmonic Valve: The pulmonic valve was not well visualized. Aorta: The aortic root is normal in size and structure. Venous: The inferior vena cava is normal in size with greater than 50% respiratory variability, suggesting right atrial pressure of 3 mmHg. LEFT VENTRICLE PLAX 2D LVIDd:         4.80 cm     Diastology LVIDs:         3.70 cm     LV e' medial:  6.09 cm/s LV PW:         1.20 cm     LV e' lateral: 18.60 cm/s LV IVS:        1.30 cm  LV Volumes (MOD) LV vol d, MOD A4C: 82.4 ml LV vol s, MOD A4C: 46.0 ml LV SV MOD A4C:     82.4 ml AORTIC VALVE LVOT Vmax:   101.00 cm/s LVOT Vmean:  66.700 cm/s LVOT VTI:    0.156 m  SHUNTS Systemic VTI: 0.16 m Eleonore Chiquito MD Electronically signed by Eleonore Chiquito MD Signature Date/Time: 01/09/2021/12:55:44 PM    Final     Scheduled Meds:  aspirin EC  81 mg Oral Daily   atorvastatin  40 mg Oral QHS   enoxaparin (LOVENOX) injection  40 mg Subcutaneous QHS   fluconazole  100 mg Oral Daily   gabapentin  200 mg Oral TID   insulin aspart  0-9 Units Subcutaneous Q4H   lactulose  10 g Oral Daily   losartan  25 mg Oral Daily   metoprolol succinate  25 mg Oral Daily    nystatin  5 mL Oral QID   oxyCODONE  20 mg Oral Q12H   pantoprazole  40 mg Oral Daily   polyethylene glycol  17 g Oral BID   venlafaxine XR  75 mg Oral q morning   Continuous Infusions:  sodium chloride     sodium chloride     vancomycin 1,250 mg (01/10/21 1129)    LOS: 4 days   Kerney Elbe, DO [facility] PAGER is on AMION  If 7PM-7AM, please contact night-coverage [email or web address]

## 2021-01-10 NOTE — Plan of Care (Signed)
?  Problem: Education: ?Goal: Knowledge of General Education information will improve ?Description: Including pain rating scale, medication(s)/side effects and non-pharmacologic comfort measures ?Outcome: Progressing ?  ?Problem: Health Behavior/Discharge Planning: ?Goal: Ability to manage health-related needs will improve ?Outcome: Progressing ?  ?Problem: Clinical Measurements: ?Goal: Ability to maintain clinical measurements within normal limits will improve ?Outcome: Progressing ?Goal: Will remain free from infection ?Outcome: Not Progressing ?  ?

## 2021-01-10 NOTE — Progress Notes (Signed)
[facility]  Date of Admission:  01/05/2021     CC: Mrsa bacteremia  Lines: Peripheral iv's  Abx: 7/14-c vanc    ASSESSMENT: 55 yo male recent mrsa bacteremia/epidural abscess/OM and psoas abscess s/p open debridement 6/28 and IR drainage of left psoas discharged on daptomycin, now readmitted 7/13 for worsening back pain of a few days found to have recurrent mrsa bacteremia  7/13 & 7/15 bcx mrsa 7/17 bcx ngtd  7/13 mri lumbar/thoracic showed several areas of fluid collection with a more loculated focus at L4-5. There is no focal LE neurologic deficit or urinary/bowel incontinence. NSG reviewed case and felt no urgent decompression at this time  7/16 IR reviewed case and percutaneously drained the 5 cm L3 level subdural fluid collection.  7/17 tte no vegetation  Patient remains with significant leukocytosis. His fever so far had resolved.   Concern for drug resistance vs source control, and with recurrent mrsa bsi endocarditis  PLAN: F/u blood cx susceptibility If wbc remains high and the susceptibility indicate daptomycin remains sensitive, will need to discuss case with NSG again regarding debridement for source control Continue vancomycin for now Await tee   I spent more than 35 minute reviewing data/chart, and coordinating care and >50% direct face to face time providing counseling/discussing diagnostics/treatment plan with patient    Principal Problem:   MRSA bacteremia Active Problems:   CAD (coronary artery disease)   HTN (hypertension)   Uncontrolled diabetes mellitus (HCC)   Epidural abscess   Normocytic anemia   Hyponatremia   Anxiety   Chronic diastolic CHF (congestive heart failure) (HCC)   No Known Allergies  Scheduled Meds:  aspirin EC  81 mg Oral Daily   atorvastatin  40 mg Oral QHS   enoxaparin (LOVENOX) injection  40 mg Subcutaneous QHS   fluconazole  100 mg Oral Daily   gabapentin  200 mg Oral TID    insulin aspart  0-9 Units Subcutaneous Q4H   lactulose  10 g Oral Daily   losartan  25 mg Oral Daily   metoprolol succinate  25 mg Oral Daily   nystatin  5 mL Oral QID   oxyCODONE  20 mg Oral Q12H   pantoprazole  40 mg Oral Daily   polyethylene glycol  17 g Oral BID   venlafaxine XR  75 mg Oral q morning   Continuous Infusions:  sodium chloride     sodium chloride     vancomycin 1,250 mg (01/10/21 1129)   PRN Meds:.acetaminophen **OR** acetaminophen, ALPRAZolam, alum & mag hydroxide-simeth, diclofenac Sodium, HYDROmorphone (DILAUDID) injection, labetalol, lidocaine, ondansetron **OR** ondansetron (ZOFRAN) IV   SUBJECTIVE: Back pain stable No new LE neurologic deficit No headache No other joint pain  Pending abx susceptibility.  Review of Systems: ROS All other ROS was negative, except mentioned above     OBJECTIVE: Vitals:   01/10/21 0400 01/10/21 0651 01/10/21 0700 01/10/21 1130  BP: (!) 136/103  (!) 146/85 137/87  Pulse: (!) 108 (!) 111 (!) 111 (!) 108  Resp: 19 20 19 20   Temp: (!) 97.2 F (36.2 C)  98.2 F (36.8 C) 98.4 F (36.9 C)  TempSrc: Oral  Oral Oral  SpO2: 96% 93% 98% 94%  Weight:       Body mass index is 28.56 kg/m.  Physical Exam General/constitutional: no distress, drowsy, cooperative when awake HEENT: Normocephalic, PER, Conj Clear, EOMI, Oropharynx clear Neck supple CV: rrr no mrg Lungs: clear to  auscultation, normal respiratory effort Abd: Soft, Nontender Ext: no edema Skin: No Rash Neuro: nonfocal MSK: tender lumbar spine   Central line presence: no  Lab Results Lab Results  Component Value Date   WBC 31.0 (H) 01/10/2021   HGB 11.6 (L) 01/10/2021   HCT 35.3 (L) 01/10/2021   MCV 86.7 01/10/2021   PLT 747 (H) 01/10/2021    Lab Results  Component Value Date   CREATININE 0.58 (L) 01/10/2021   BUN 5 (L) 01/10/2021   NA 129 (L) 01/10/2021   K 3.9 01/10/2021   CL 93 (L) 01/10/2021   CO2 25 01/10/2021    Lab Results   Component Value Date   ALT 22 01/10/2021   AST 23 01/10/2021   ALKPHOS 124 01/10/2021   BILITOT 1.0 01/10/2021      Microbiology: Recent Results (from the past 240 hour(s))  Blood culture (routine x 2)     Status: Abnormal (Preliminary result)   Collection Time: 01/05/21  5:20 PM   Specimen: BLOOD  Result Value Ref Range Status   Specimen Description BLOOD SITE NOT SPECIFIED  Final   Special Requests   Final    BOTTLES DRAWN AEROBIC AND ANAEROBIC Blood Culture adequate volume   Culture  Setup Time   Final    GRAM POSITIVE COCCI IN BOTH AEROBIC AND ANAEROBIC BOTTLES CRITICAL VALUE NOTED.  VALUE IS CONSISTENT WITH PREVIOUSLY REPORTED AND CALLED VALUE.    Culture (A)  Final    METHICILLIN RESISTANT STAPHYLOCOCCUS AUREUS Sent to [facility] for further susceptibility testing. Performed at [facility] [place] [facility], [address], [place], [place] [address]    Report Status PENDING  Incomplete   Organism ID, Bacteria METHICILLIN RESISTANT STAPHYLOCOCCUS AUREUS  Final      Susceptibility   Methicillin resistant staphylococcus aureus - MIC*    CIPROFLOXACIN >=8 RESISTANT Resistant     ERYTHROMYCIN >=8 RESISTANT Resistant     GENTAMICIN <=0.5 SENSITIVE Sensitive     OXACILLIN >=4 RESISTANT Resistant     TETRACYCLINE <=1 SENSITIVE Sensitive     VANCOMYCIN 1 SENSITIVE Sensitive     TRIMETH/SULFA >=320 RESISTANT Resistant     CLINDAMYCIN <=0.25 SENSITIVE Sensitive     RIFAMPIN <=0.5 SENSITIVE Sensitive     Inducible Clindamycin NEGATIVE Sensitive     * METHICILLIN RESISTANT STAPHYLOCOCCUS AUREUS  Blood culture (routine x 2)     Status: Abnormal   Collection Time: 01/05/21  6:10 PM   Specimen: BLOOD  Result Value Ref Range Status   Specimen Description BLOOD SITE NOT SPECIFIED  Final   Special Requests   Final    BOTTLES DRAWN AEROBIC AND ANAEROBIC Blood Culture results may not be optimal due to an inadequate volume of blood received in culture bottles   Culture  Setup Time   Final     GRAM POSITIVE COCCI IN BOTH AEROBIC AND ANAEROBIC BOTTLES CRITICAL VALUE NOTED.  VALUE IS CONSISTENT WITH PREVIOUSLY REPORTED AND CALLED VALUE.    Culture (A)  Final    STAPHYLOCOCCUS AUREUS SUSCEPTIBILITIES PERFORMED ON PREVIOUS CULTURE WITHIN THE LAST 5 DAYS. Performed at [facility], [address], [place], [place] [address]    Report Status 01/08/2021 FINAL  Final  Resp Panel by RT-PCR (Flu A&B, Covid) Nasopharyngeal Swab     Status: None   Collection Time: 01/05/21  9:32 PM   Specimen: Nasopharyngeal Swab; Nasopharyngeal(NP) swabs in vial transport medium  Result Value Ref Range Status   SARS Coronavirus 2 by RT PCR  NEGATIVE NEGATIVE Final    Comment: (NOTE) SARS-CoV-2 target nucleic acids are NOT DETECTED.  The SARS-CoV-2 RNA is generally detectable in upper respiratory specimens during the acute phase of infection. The lowest concentration of SARS-CoV-2 viral copies this assay can detect is 138 copies/mL. A negative result does not preclude SARS-Cov-2 infection and should not be used as the sole basis for treatment or other patient management decisions. A negative result may occur with  improper specimen collection/handling, submission of specimen other than nasopharyngeal swab, presence of viral mutation(s) within the areas targeted by this assay, and inadequate number of viral copies(<138 copies/mL). A negative result must be combined with clinical observations, patient history, and epidemiological information. The expected result is Negative.  Fact Sheet for Patients:  BloggerCourse.com  Fact Sheet for Healthcare Providers:  SeriousBroker.it  This test is no t yet approved or cleared by the [place] FDA and  has been authorized for detection and/or diagnosis of SARS-CoV-2 by FDA under an Emergency Use Authorization (EUA). This EUA will remain  in effect (meaning this test can be used) for the duration of  the COVID-19 declaration under Section 564(b)(1) of the Act, 21 U.S.C.section 360bbb-3(b)(1), unless the authorization is terminated  or revoked sooner.       Influenza A by PCR NEGATIVE NEGATIVE Final   Influenza B by PCR NEGATIVE NEGATIVE Final    Comment: (NOTE) The Xpert Xpress SARS-CoV-2/FLU/RSV plus assay is intended as an aid in the diagnosis of influenza from Nasopharyngeal swab specimens and should not be used as a sole basis for treatment. Nasal washings and aspirates are unacceptable for Xpert Xpress SARS-CoV-2/FLU/RSV testing.  Fact Sheet for Patients: BloggerCourse.com  Fact Sheet for Healthcare Providers: SeriousBroker.it  This test is not yet approved or cleared by the [place] FDA and has been authorized for detection and/or diagnosis of SARS-CoV-2 by FDA under an Emergency Use Authorization (EUA). This EUA will remain in effect (meaning this test can be used) for the duration of the COVID-19 declaration under Section 564(b)(1) of the Act, 21 U.S.C. section 360bbb-3(b)(1), unless the authorization is terminated or revoked.  Performed at [facility], [address], [place], [place] [address]   Culture, blood (routine x 2)     Status: Abnormal   Collection Time: 01/07/21  6:52 AM   Specimen: BLOOD  Result Value Ref Range Status   Specimen Description BLOOD RIGHT ANTECUBITAL  Final   Special Requests   Final    BOTTLES DRAWN AEROBIC AND ANAEROBIC Blood Culture adequate volume   Culture  Setup Time   Final    GRAM POSITIVE COCCI IN CLUSTERS ANAEROBIC BOTTLE ONLY CRITICAL VALUE NOTED.  VALUE IS CONSISTENT WITH PREVIOUSLY REPORTED AND CALLED VALUE.    Culture (A)  Final    STAPHYLOCOCCUS AUREUS SUSCEPTIBILITIES PERFORMED ON PREVIOUS CULTURE WITHIN THE LAST 5 DAYS. Performed at [facility], [address] [place], [place] [address]    Report Status 01/10/2021 FINAL  Final  Culture, blood  (routine x 2)     Status: None (Preliminary result)   Collection Time: 01/07/21  6:52 AM   Specimen: BLOOD RIGHT HAND  Result Value Ref Range Status   Specimen Description BLOOD RIGHT HAND  Final   Special Requests   Final    BOTTLES DRAWN AEROBIC ONLY Blood Culture results may not be optimal due to an inadequate volume of blood received in culture bottles   Culture   Final    NO GROWTH 3  DAYS Performed at [facility], [address] [place], [place] [address]    Report Status PENDING  Incomplete  Aerobic/Anaerobic Culture w Gram Stain (surgical/deep wound)     Status: None (Preliminary result)   Collection Time: 01/08/21 10:10 AM   Specimen: Wound  Result Value Ref Range Status   Specimen Description WOUND  Final   Special Requests NONE  Final   Gram Stain   Final    ABUNDANT WBC PRESENT, PREDOMINANTLY PMN NO ORGANISMS SEEN    Culture   Final    NO GROWTH 2 DAYS NO ANAEROBES ISOLATED; CULTURE IN PROGRESS FOR 5 DAYS Performed at [facility], [address], [place], [place] [address]    Report Status PENDING  Incomplete  Culture, blood (Routine X 2) w Reflex to ID Panel     Status: None (Preliminary result)   Collection Time: 01/09/21 10:26 AM   Specimen: BLOOD  Result Value Ref Range Status   Specimen Description BLOOD RIGHT ANTECUBITAL  Final   Special Requests   Final    BOTTLES DRAWN AEROBIC AND ANAEROBIC Blood Culture adequate volume   Culture  Setup Time   Final    GRAM POSITIVE COCCI IN CLUSTERS ANAEROBIC BOTTLE ONLY CRITICAL VALUE NOTED.  VALUE IS CONSISTENT WITH PREVIOUSLY REPORTED AND CALLED VALUE. Performed at [facility], [address], [place], [place] [address]    Culture GRAM POSITIVE COCCI  Final   Report Status PENDING  Incomplete  Culture, blood (Routine X 2) w Reflex to ID Panel     Status: None (Preliminary result)   Collection Time: 01/09/21 10:49 AM   Specimen: BLOOD LEFT HAND  Result Value Ref Range Status   Specimen  Description BLOOD LEFT HAND  Final   Special Requests   Final    BOTTLES DRAWN AEROBIC AND ANAEROBIC Blood Culture results may not be optimal due to an inadequate volume of blood received in culture bottles   Culture  Setup Time   Final    GRAM POSITIVE COCCI IN CLUSTERS ANAEROBIC BOTTLE ONLY CRITICAL VALUE NOTED.  VALUE IS CONSISTENT WITH PREVIOUSLY REPORTED AND CALLED VALUE. Performed at [facility], [address], [place], [place] [address]    Culture [facility] POSITIVE COCCI  Final   Report Status PENDING  Incomplete     Serology:   Imaging: If present, new imagings (plain films, ct scans, and mri) have been personally visualized and interpreted; radiology reports have been reviewed. Decision making incorporated into the Impression / Recommendations.  7/13 mri thoracic lumbar Paraspinal and other soft tissues: Patient is status post left laminectomy at L3. Fluid tracks from the laminectomy site into a subcutaneous and deep soft tissue collection with peripheral enhancement measuring 4.7 x 5.0 x 1.7 cm. The ventral epidural collection extends inferiorly to the L2 level. Separate prominent ventral collection is present posterior to L4 prominently right the left. This collection measures 2.9 x 1.3 cm on the sagittal images.   Disc levels:   In addition to the epidural collections, disc disease is again noted at L4-5 and L5-S1. Central foraminal narrowing are associated.   IMPRESSION: 1. T7 laminectomy subcutaneous peripherally enhancing fluid collection at this level as well. 2. Residual posterior epidural fluid collection in the thoracic spine with similar cephalo caudad extension from T5-6-T9 but less mass effect. 3. Progressive signal abnormality and enhancement throughout the T7 and T8 vertebral bodies in the T7-8 thoracic disc consistent with disc osteomyelitis. 4. Progressive  paraspinous soft tissue enhancement at T3-4 through T9-10 without drainable  collection. 5. Persistent extensive ventral epidural collection compatible with abscess. 6. Status post left laminectomy at L3 with peripherally enhancing fluid collection extending from the operative site into the subcutaneous and deep tissues as described. This is concerning abscess. 7. Fluid tracks from the laminectomy site into a subdural deep soft tissue collection measuring 4.7 x 5.0 x 1.7 cm. 8. Separate ventral epidural collection at L4-5 and L5-S1 consistent with epidural abscess. 9. Progressive signal abnormality and enhancement in the L3 and L4 vertebral bodies. 10. 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   7/17 tte  1. Left ventricular ejection fraction, by estimation, is 55 to 60%. The  left ventricle has normal function. The left ventricle has no regional  wall motion abnormalities. There is mild concentric left ventricular  hypertrophy.   2. Right ventricular systolic function is normal. The right ventricular  size is normal.   3. The mitral valve is grossly normal. Trivial mitral valve  regurgitation. No evidence of mitral stenosis.   4. The aortic valve is grossly normal. Aortic valve regurgitation is not  visualized. No aortic stenosis is present.   5. The inferior vena cava is normal in size with greater than 50%  respiratory variability, suggesting right atrial pressure of 3 mmHg.    Raymondo Band, MD [facility] [phone] pager    01/10/2021, 2:26 PM

## 2021-01-10 NOTE — Progress Notes (Signed)
Pharmacy Antibiotic Note  Louis [name] is a 55 y.o. male admitted on 01/05/2021 with increased back pain, has a known and recent history of MRSA bacteremia and epidural abscess, vertebral osteomyelitis and psoas abscess. He was seen by ID during an admission in June where he underwent neurosurgery to evacuate his epidural abscess. He was put on daptomycin as an outpatient to complete therapy in late July but he is presenting now with worsening symptoms and positive blood cultures.  Pharmacy has been consulted for vancomycin dosing.  Vanc Trough =9 (drawn late), Vanc Peak = 22 Calculated AUC = 385   WBC elevated at 31, SCr wnl, AF   Plan: Increase Vancomycin to 1250 mg IV Q 8 hrs. Goal AUC 400-550. Expected AUC: 481 Recheck vanc levels at steady state    Weight: 92.9 kg (204 lb 12.9 oz)  Temp (24hrs), Avg:98.1 F (36.7 C), Min:97.2 F (36.2 C), Max:99.5 F (37.5 C)  Recent Labs  Lab 01/06/21 0310 01/07/21 0652 01/08/21 0303 01/09/21 0105 01/09/21 2141 01/10/21 0731  WBC 31.7* 30.5* 27.4* 30.8* 32.1*  --   CREATININE 0.74 0.96 0.65 0.51* 0.53*  --   VANCOTROUGH  --   --   --   --   --  9*  VANCOPEAK  --   --   --   --  22*  --      Estimated Creatinine Clearance: 118.6 mL/min (A) (by C-G formula based on SCr of 0.53 mg/dL (L)).    No Known Allergies  Antimicrobials this admission: Vancomycin 7/14>>   Microbiology results: 7/13 BCx: MRSA  7/15 BCx >>1/3 MRSA  7/17 BCx >> 3/4 GPCs in clusters    Thank you for allowing pharmacy to be a part of this patient's care.  Vinnie Level, PharmD., BCPS, BCCCP Clinical Pharmacist Please refer to [facility] for unit-specific pharmacist

## 2021-01-10 NOTE — Progress Notes (Signed)
   [facility] has been requested to perform a transesophageal echocardiogram on Louis [name] for bacteremia.  After careful review of history and examination, the risks and benefits of transesophageal echocardiogram have been explained including risks of esophageal damage, perforation (1:10,000 risk), bleeding, pharyngeal hematoma as well as other potential complications associated with conscious sedation including aspiration, arrhythmia, respiratory failure and death. Alternatives to treatment were discussed, questions were answered.   Patient still very lethargic and still has some confusion/altered mental status. However, wife Jontrell Bushong) present and reviewed procedure with her. She agrees to proceed. Procedure is scheduled for 01/11/2021 at 3:00pm with Dr. Jens Som. Will place orders.  Reviewed labs. Patient does have thrombocytosis with platelets peaking at 863,000 on 01/06/2021 but trending down - 747,000 today. Hemoglobin stable at 11.6.  Corrin Parker, PA-C 01/10/2021 4:39 PM

## 2021-01-10 NOTE — Progress Notes (Signed)
PROGRESS NOTE    Louis [name]  DEY:[id number] DOB: [birth year] DOA: 01/05/2021 PCP: Ray Church, NP   Brief Narrative:  Patient is a 55 year old Caucasian male with a past medical history significant for but not limited to type 2 diabetes mellitus, hypertension, history of CAD with CABG in 2017, anxiety, chronic pain as well as other comorbidities with recent admission with an epidural abscess, vertebral osteomyelitis, psoas abscess and MRSA bacteremia now returning to the ED with worsening back pain.  He reported that his back pain has been stable until approximately 5 days ago when it became more severe and progressively worsened since then.  He did not notice any new leg numbness or weakness did have some urinary incontinence.  Back pain was reportedly severe and constant worse with movement and radiating to both his legs.  He denied any fevers or chills but stated he did have some mild drainage from his surgical wounds.  He missed his dose of antibiotics yesterday but has not missed any other doses.  He been admitted on 12/12/2020 after he is found to have an epidural abscess and vertebral osteomyelitis and after over there is no neurosurgery or ID backup.  He underwent a laminectomy and evacuation of abscess on 12/13/2020 and then drainage of the psoas abscess by IR on 12/22/2020.  Blood cultures from 12/14/2018 grew out MRSA but there is no vegetation seen on TTE and blood cultures were drawn and repeated 12/15/2020 remain negative.  He has been on daptomycin at home.  Upon arrival to ED is found to be saturating well on room air but he did have a leukocytosis 23,900 and platelet count of 681,000.  COVID PCR was negative.  He had an MRI of the thoracic and lumbar spine which revealed multiple fluid collections involving the ventral epidural abscess at the L4-L5 level with progressive discitis and osteomyelitis at the L4-L5 and L5-S1 levels.  Blood cultures were repeated in the ED and he was given multiple  doses of IV analgesics and he is evaluated by neurosurgery who recommended medical admission.  Initial plan was for lumbar decompression however Dr. Reatha Armour of neurosurgery evaluated and felt that his thoracic mass-effect was improved and that even though there was persistent ostial discitis and epidural abscess throughout the thoracolumbar spine there is possibly a more loculated focus of the abscess at L4-L5 with stenosis but no focal weakness, groin numbness or numbness and tingling in his legs he did not believe that urgent or emergent decompression was needed at this time.  Patient has been confused more often per his wife and neurosurgery is obtaining MRI of the brain with and without contrast given concern of some cerebral spread of infection.  Because he has failed multiple medical treatments will likely need broaden antibiotic therapy and will consult ID and neurosurgery feels that the stenosis L4-L5 could be decompressed however neurologically patient is doing well with no focal deficits and therefore they are recommending aggressive medical and supportive care.  ID Changed to IV Vancomycin and consulted IR for drainage of the deep soft tissue collection with peripheral enhancement measuring 4.7 x 5.0 x 1.7 cm.  The image guided drainage was done today by Dr. Vernard Gambles.  Dr. Reatha Armour of neurosurgery recommending continuing vancomycin and aggressive medical care.  Patient has some nausea today and an abnormal EKG so cardiology was consulted for further evaluation and stat troponins were ordered and they were negative.  Cardiology evaluated and felt that we should repeat a limited echo.  Limited echo  done and showed no wall motion abnormalities.  There is no evidence of STEMI.   The patient is to undergo a TEE on Tuesday, 01/11/2021.  ID recommending following blood culture susceptibility.  Per ID if WBC remains high and the susceptibility indicates daptomycin remain sensitive they are recommending that we  need to discuss with neurosurgery given regarding debridement for source control.  Assessment & Plan:   Principal Problem:   MRSA bacteremia Active Problems:   CAD (coronary artery disease)   HTN (hypertension)   Uncontrolled diabetes mellitus (HCC)   Epidural abscess   Normocytic anemia   Hyponatremia   Anxiety   Chronic diastolic CHF (congestive heart failure) (HCC)  Sepsis secondary to epidural abscess; discitis/osteomyelitis; MRSA bacteremia     -Patient met sepsis physiology on admission as he was tachycardic, tachypneic, had a leukocytosis and a source of Infection  -Pt with laminectomy & evacuation of abscess 12/21/20, IR drainage of psoas abscess 6/29, and MRSA bacteremia currently on daptomycin presents with worsening back pain and found on MRI L- and T-spine to have multiple fluid collections including ventral epidural abscess and progressive osteomyelitis at L 4-5 -Blood cultures grew MRSA on 6/20, were negative 6/22, no vegetation seen on TTE 12/15/20  - Neurosurgery was planning for L4-5 decompression 01/06/21 but re-evaluated and since patient is Neurologically intact will hold off and treat medically do not feel that urgent or emergent decompression is needed at this time -Give another 1-1/2 L bolus today and start the patient on sodium bicarb at 75 MLS per hour -Patient continues to be tachycardic and will continue with metoprolol tartrate 25 mg p.o. nightly and also start labetalol IV -WBC is gone from 23.9 is now 31.7 -> 30.5 and is further improved to 27.4 yesterday but bumped up the day before yesterday. WBC remains elevated and went from 32.1 -> 21.0 -CRP went from 31.1 -> 27.8 and is further improved to 21.8 x2 -Continue IV antibiotics, and pain-control  -Check a UDS -We will formally consult ID for further evaluation recommendations; ID recommending stopping daptomycin and switching to vancomycin per pharmacy to dose.  We will order a TTE and if this is negative  proceeding with a TEE and I had called [facility] and they are supposedly scheduling the patient down for Monday -ID is also ordered a CT of the pelvis with contrast to further evaluate reactivation of psoas abscess and they are repeating blood cultures and removed his PICC line; CT of the pelvis showed "No pelvic abscess or acute osseous abnormality identified. Trace free fluid in the deep pelvis, likely related to anasarca. Subtle residual of the left psoas abscess at the L5 level which was drained last month. Infected lumbar levels minimally included on this exam. See Lumbar MRI 01/05/2021." -ID is now consulted interventional radiology for the fluid collection mentioned in the deep subdural soft tissue measuring 4.7 x 5.0 x 1.7 cm; patient underwent drainage today of this collection by Dr. Vernard Gambles -ID is now also sending susceptibility testing to daptomycin, ceftrarloibne and linezolid; ID is recommending following up on blood culture susceptibility and thinks that the blood pressure remains high and the susceptibility indicates daptomycin remain sensitive that we will need to discuss with neurosurgery again regarding debridement for source control -Cardiology been consulted for TEE and this likely is going to be done Tuesday    Confusion, in the setting of infection -Neurosurgery is obtaining a MRI of the brain with and without contrast given that he does have an  extensive epidural abscess the spine with encephalopathy -MRI Brain showed "Significantly motion degraded examination, as described and limiting evaluation.   Within this limitation, no definite acute intracranial abnormality is identified. Mild generalized parenchymal atrophy and cerebral white matter chronic small vessel ischemic disease. Mild paranasal sinus disease, as described" -Patient is improving but still having a lot of pain   Unwitnessed Fall -[place] sometime early yesterday morning -Repeat head CT showed no acute intracranial  abnormalities noted -Fall Precautions    Normocytic Anemia  - Hgb is 8.6, down from 11.0 on 12/29/20; repeat hemoglobin/hematocrit is now 12.4/38.0 -> 11.4/35.5 -> 10.9/32.3 -> 10.6/32.7 -> 12.9/39.6 -> 11.6/35.3 - No overt bleeding  -Checked anemia panel and showed iron level of 19, U IBC 139, TIBC 158, saturation ratios of 12%, ferritin level 864, folate of 2.5, vitamin B12 level 1070 -Continue to monitor for signs and symptoms of bleeding -Repeat CBC in a.m.   Type II DM  - A1c was 9.1% in June 2022  - Check CBGs and use SSI for now ; CBGs ranging from 134-169   CAD status post CABG - No anginal complaints -He had a LIMA to LAD, vein graft to distal circumflex and vein graft RCA in 2017. -His catheterization in March 2020 demonstrated patent vein grafts - Held ASA preoperatively however if he is not going to surgery we will resume, continue atorvastatin 40 mg nightly and beta-blocker with metoprolol tartrate 25 mg nightly; all cardiac medications very resumed -Had Nausea today so checked Troponin given Abnormal EKG and Tropon went from 24 -> 31 -Cardiology was consulted and evaluated and checked a limited echo  Abnormal EKG -Patient denied Chest Pain but had Nausea but no vomiting -Had ST Elevation in leads V1-V2 without Reciprocal changes -Troponin Checked and went from 24 -> 31 -Cardiology Consulted and checked Limited ECHO -ECHO showed now [facility] -Cardiology does not feel he has a STEMI and thinks that his ST elevations are more pronounced in the setting of pain and hypertension   Hyponatremia  - Serum sodium 129 on admission and is improved to 135 but is now back down to 131 -> 132 -> 129 -Appears chronic, possibly related to his SNRI   -Checked urine sodium was 14 and Urine Osm was 484 -Continue monitor and trend sodium level  Thrombocytosis -Likely reactive in the setting of infection -The patient's platelet count was 681 is now trended down to 457 but has trended back up  and today is now 791 -> 770 -> 747 -Continue monitor and trend and repeat   Elevated anion metabolic acidosis -His anion gap acidosis is now improved and he has a CO2 of 25, chloride level of 93, anion gap of 11 -He was started on sodium bicarbonate drip and this has been discontinued and now also stopped NS  Hypomagnesemia -Mag Level was 1.5 and improved and is now 1.7 -Replete with IV Mag Sulfate 2 Grams -Continue to Monitor and Replete as Necessary -Repeat Mag Level in the AM   Hypokalemia -Patient's K+ Level is now 3.9 -Continue to Monitor and Replete as Necessary -Repeat CMP in the AM   Oral Thrush -Start nystatin swish and spit and also obtain fluconazole consult per pharmacy   GERD -Continue with Pantoprazole 40 mg p.o. daily  Hyperbilirubinemia -Likely reactive -Patient's T bili and is now 1.5 -> 0.7 and today is 0.6 -> 1.2 -> 1.0 -Continue to Monitor and trend and repeat CMP in the a.m.   Chronic Diastolic CHF  -Appears hypovolemic  on admission  -Hold Lasix, started gentle IVF hydration, monitor volume status   -Was given 1.5 L yesterday and started on maintenance IV fluids with sodium bicarb and given his metabolic acidosis; Now will stop IVF with Sodium Bicarbonate and start NS at 75 mL/hr.  Normal saline is to be discontinued today -Strict I's and O's and daily weights: Patient is - 4.179 liters since admission -Continue to Monitor Volume Status Carefully  Normocytic Anemia -Patient's Hgb/Hct 12.4/38.0 -> 11.4/35.5 -> 10.9/32.3 -> 10.6/32.7 -> 12.9/39.6 -> 11.6/35.3 -Likley Dilutional Drop -Continue to Monitor for S/Sx of Bleeding; Currently no overt bleeding noted -Repeat CBC in the AM   Anxiety  - Continue venlafaxine XR 25 mg daily and as-needed Xanax  DVT prophylaxis: SCDs for now and will start Lovenox Code Status: FULL CODE Family Communication: Discussed with wife at bedside  Disposition Plan: Pending further clinical improvement and clearance by  specialists  Status is: Inpatient  Remains inpatient appropriate because:Unsafe d/c plan, IV treatments appropriate due to intensity of illness or inability to take PO, and Inpatient level of care appropriate due to severity of illness  Dispo: The patient is from: Home              Anticipated d/c is to: Home              Patient currently is not medically stable to d/c.   Difficult to place patient No  Consultants:  Neurosurgery ID IR Cardiology  Procedures:  ECHOCARDIOGRAM IMPRESSIONS     1. Left ventricular ejection fraction, by estimation, is 55 to 60%. The  left ventricle has normal function. The left ventricle has no regional  wall motion abnormalities. There is moderate left ventricular hypertrophy.  Left ventricular diastolic  parameters are indeterminate.   2. Right ventricular systolic function is normal. The right ventricular  size is normal.   3. The mitral valve is normal in structure. No evidence of mitral valve  regurgitation. No evidence of mitral stenosis.   4. The aortic valve is tricuspid. Aortic valve regurgitation is not  visualized. No aortic stenosis is present.   5. The inferior vena cava is dilated in size with >50% respiratory  variability, suggesting right atrial pressure of 8 mmHg.   FINDINGS   Left Ventricle: Left ventricular ejection fraction, by estimation, is 55  to 60%. The left ventricle has normal function. The left ventricle has no  regional wall motion abnormalities. The left ventricular internal cavity  size was normal in size. There is   moderate left ventricular hypertrophy. Left ventricular diastolic  parameters are indeterminate.   Right Ventricle: The right ventricular size is normal. Right ventricular  systolic function is normal.   Left Atrium: Left atrial size was normal in size.   Right Atrium: Right atrial size was normal in size.   Pericardium: There is no evidence of pericardial effusion.   Mitral Valve: The mitral  valve is normal in structure. No evidence of  mitral valve regurgitation. No evidence of mitral valve stenosis.   Tricuspid Valve: The tricuspid valve is normal in structure. Tricuspid  valve regurgitation is trivial. No evidence of tricuspid stenosis.   Aortic Valve: The aortic valve is tricuspid. Aortic valve regurgitation is  not visualized. No aortic stenosis is present. Aortic valve mean gradient  measures 2.0 mmHg. Aortic valve peak gradient measures 3.1 mmHg.   Pulmonic Valve: The pulmonic valve was normal in structure. Pulmonic valve  regurgitation is not visualized. No evidence of pulmonic stenosis.  Aorta: The aortic root is normal in size and structure.   Venous: The inferior vena cava is dilated in size with greater than 50%  respiratory variability, suggesting right atrial pressure of 8 mmHg.   IAS/Shunts: No atrial level shunt detected by color flow Doppler.      LEFT VENTRICLE  PLAX 2D  LVIDd:         4.00 cm  LVIDs:         3.40 cm  LV PW:         1.60 cm  LV IVS:        1.50 cm  LVOT diam:     2.50 cm  LV SV:         79  LV SV Index:   36  LVOT Area:     4.91 cm      RIGHT VENTRICLE             IVC  RV Basal diam:  3.30 cm     IVC diam: 2.20 cm  RV S prime:     13.40 cm/s  TAPSE (M-mode): 1.3 cm   LEFT ATRIUM             Index       RIGHT ATRIUM           Index  LA diam:        3.50 cm 1.61 cm/m  RA Area:     14.90 cm  LA Vol (A2C):   69.4 ml 31.91 ml/m RA Volume:   36.10 ml  16.60 ml/m  LA Vol (A4C):   51.3 ml 23.59 ml/m  LA Biplane Vol: 62.6 ml 28.78 ml/m   AORTIC VALVE  AV Area (Vmax):    6.50 cm  AV Area (Vmean):   5.74 cm  AV Area (VTI):     5.53 cm  AV Vmax:           87.60 cm/s  AV Vmean:          59.600 cm/s  AV VTI:            0.142 m  AV Peak Grad:      3.1 mmHg  AV Mean Grad:      2.0 mmHg  LVOT Vmax:         116.00 cm/s  LVOT Vmean:        69.700 cm/s  LVOT VTI:          0.160 m  LVOT/AV VTI ratio: 1.13     AORTA  Ao  Root diam: 3.50 cm  Ao Asc diam:  3.60 cm      SHUNTS  Systemic VTI:  0.16 m  Systemic Diam: 2.50 cm   Antimicrobials:  Anti-infectives (From admission, onward)    Start     Dose/Rate Route Frequency Ordered Stop   01/10/21 1200  vancomycin (VANCOREADY) IVPB 1250 mg/250 mL        1,250 mg 166.7 mL/hr over 90 Minutes Intravenous Every 8 hours 01/10/21 1037     01/09/21 1900  fluconazole (DIFLUCAN) tablet 100 mg        100 mg Oral Daily 01/09/21 1811 01/16/21 0959   01/07/21 0500  vancomycin (VANCOREADY) IVPB 1500 mg/300 mL  Status:  Discontinued        1,500 mg 150 mL/hr over 120 Minutes Intravenous Every 12 hours 01/06/21 1520 01/10/21 1037   01/06/21 2000  DAPTOmycin (CUBICIN) 750 mg in sodium chloride 0.9 % IVPB  Status:  Discontinued        750 mg 130 mL/hr over 30 Minutes Intravenous Daily 01/06/21 0237 01/06/21 1520   01/06/21 1700  vancomycin (VANCOREADY) IVPB 2000 mg/400 mL        2,000 mg 200 mL/hr over 120 Minutes Intravenous  Once 01/06/21 1520 01/06/21 1848        Subjective: Seen and examined at bedside he is complaining of significant more pain today and resting.  Denies any chest pain, lightheadedness or dizziness.  White blood cell count remains elevated.  Patient is going for TEE on Tuesday. No other concerns or complaints at this time except increased back pain.   Objective: Vitals:   01/10/21 0400 01/10/21 0651 01/10/21 0700 01/10/21 1130  BP: (!) 136/103  (!) 146/85 137/87  Pulse: (!) 108 (!) 111 (!) 111 (!) 108  Resp: $Remo'19 20 19 20  'pUwxf$ Temp: (!) 97.2 F (36.2 C)  98.2 F (36.8 C) 98.4 F (36.9 C)  TempSrc: Oral  Oral Oral  SpO2: 96% 93% 98% 94%  Weight:        Intake/Output Summary (Last 24 hours) at 01/10/2021 1553 Last data filed at 01/10/2021 1231 Gross per 24 hour  Intake 228.4 ml  Output 2050 ml  Net -1821.6 ml    Filed Weights   01/09/21 1700  Weight: 92.9 kg   Examination: Physical Exam:  Constitutional: Chronically ill-appearing  Caucasian male currently in mild distress appears uncomfortable complaining about some back pain again  Eyes: Lids and conjunctivae normal, sclerae anicteric  ENMT: External Ears, Nose appear normal. Grossly normal hearing.  Neck: Appears normal, supple, no cervical masses, normal ROM, no appreciable thyromegaly; no appreciable JVD Respiratory: Diminished to auscultation bilaterally with coarse breath sounds, no wheezing, rales, rhonchi or crackles. Normal respiratory effort and patient is not tachypenic. No accessory muscle use.  Unlabored breathing Cardiovascular: Tachycardic rate but regular rhythm., no murmurs / rubs / gallops. S1 and S2 auscultated.  Has some trace edema Abdomen: Soft, non-tender, distended secondary to body habitus. Bowel sounds positive.  GU: Deferred. Musculoskeletal: No clubbing / cyanosis of digits/nails. No joint deformity upper and lower extremities.  Skin: No rashes, lesions, ulcers on limited skin evaluation. No induration; Warm and dry.  Neurologic: CN 2-12 grossly intact with no focal deficits. Romberg sign and cerebellar reflexes not assessed.  Psychiatric: Normal judgment and insight.  He is a little somnolent and drowsy but easily arousable. Normal mood and appropriate affect.   Data Reviewed: I have personally reviewed following labs and imaging studies  CBC: Recent Labs  Lab 01/07/21 0652 01/08/21 0303 01/09/21 0105 01/09/21 2141 01/10/21 1001  WBC 30.5* 27.4* 30.8* 32.1* 31.0*  NEUTROABS 25.8* 22.1* 26.6* 30.8* 27.6*  HGB 11.4* 10.9* 10.6* 12.9* 11.6*  HCT 35.5* 32.3* 32.7* 39.6 35.3*  MCV 90.3 85.4 87.7 88.8 86.7  PLT 863* 812* 791* 770* 747*    Basic Metabolic Panel: Recent Labs  Lab 01/07/21 0652 01/08/21 0303 01/09/21 0105 01/09/21 2141 01/10/21 1001  NA 134* 135 131* 132* 129*  K 4.4 3.7 3.3* 4.2 3.9  CL 100 99 96* 97* 93*  CO2 14* $Remov'26 27 23 25  'zbSSni$ GLUCOSE 138* 180* 195* 150* 142*  BUN 9 8 <5* 6 5*  CREATININE 0.96 0.65 0.51* 0.53*  0.58*  CALCIUM 8.5* 8.3* 7.9* 8.2* 8.1*  MG 1.6* 1.8 1.5* 1.9 1.7  PHOS 4.0 3.1 2.7 3.6 2.8    GFR: Estimated Creatinine Clearance: 118.6 mL/min (A) (by C-G formula based on SCr of 0.58  mg/dL (L)). Liver Function Tests: Recent Labs  Lab 01/07/21 0652 01/08/21 0303 01/09/21 0105 01/09/21 2141 01/10/21 1001  AST 21 58* 57* 34 23  ALT 16 29 32 29 22  ALKPHOS 127* 123 122 132* 124  BILITOT 1.5* 0.7 0.6 1.2 1.0  PROT 6.1* 5.6* 5.7* 6.7 6.0*  ALBUMIN 1.7* 1.6* 1.5* 1.7* 1.5*    No results for input(s): LIPASE, AMYLASE in the last 168 hours. No results for input(s): AMMONIA in the last 168 hours. Coagulation Profile: No results for input(s): INR, PROTIME in the last 168 hours. Cardiac Enzymes: Recent Labs  Lab 01/06/21 0310  CKTOTAL 32*    BNP (last 3 results) No results for input(s): PROBNP in the last 8760 hours. HbA1C: No results for input(s): HGBA1C in the last 72 hours. CBG: Recent Labs  Lab 01/09/21 2046 01/10/21 0013 01/10/21 0427 01/10/21 0738 01/10/21 1128  GLUCAP 174* 158* 169* 147* 134*    Lipid Profile: No results for input(s): CHOL, HDL, LDLCALC, TRIG, CHOLHDL, LDLDIRECT in the last 72 hours. Thyroid Function Tests: No results for input(s): TSH, T4TOTAL, FREET4, T3FREE, THYROIDAB in the last 72 hours. Anemia Panel: No results for input(s): VITAMINB12, FOLATE, FERRITIN, TIBC, IRON, RETICCTPCT in the last 72 hours.  Sepsis Labs: No results for input(s): PROCALCITON, LATICACIDVEN in the last 168 hours.  Recent Results (from the past 240 hour(s))  Blood culture (routine x 2)     Status: Abnormal (Preliminary result)   Collection Time: 01/05/21  5:20 PM   Specimen: BLOOD  Result Value Ref Range Status   Specimen Description BLOOD SITE NOT SPECIFIED  Final   Special Requests   Final    BOTTLES DRAWN AEROBIC AND ANAEROBIC Blood Culture adequate volume   Culture  Setup Time   Final    GRAM POSITIVE COCCI IN BOTH AEROBIC AND ANAEROBIC  BOTTLES CRITICAL VALUE NOTED.  VALUE IS CONSISTENT WITH PREVIOUSLY REPORTED AND CALLED VALUE.    Culture (A)  Final    METHICILLIN RESISTANT STAPHYLOCOCCUS AUREUS Sent to [place] for further susceptibility testing. Performed at [facility], [place] [address] [place], [place] [address]    Report Status PENDING  Incomplete   Organism ID, Bacteria METHICILLIN RESISTANT STAPHYLOCOCCUS AUREUS  Final      Susceptibility   Methicillin resistant staphylococcus aureus - MIC*    CIPROFLOXACIN >=8 RESISTANT Resistant     ERYTHROMYCIN >=8 RESISTANT Resistant     GENTAMICIN <=0.5 SENSITIVE Sensitive     OXACILLIN >=4 RESISTANT Resistant     TETRACYCLINE <=1 SENSITIVE Sensitive     VANCOMYCIN 1 SENSITIVE Sensitive     TRIMETH/SULFA >=320 RESISTANT Resistant     CLINDAMYCIN <=0.25 SENSITIVE Sensitive     RIFAMPIN <=0.5 SENSITIVE Sensitive     Inducible Clindamycin NEGATIVE Sensitive     * METHICILLIN RESISTANT STAPHYLOCOCCUS AUREUS  Blood culture (routine x 2)     Status: Abnormal   Collection Time: 01/05/21  6:10 PM   Specimen: BLOOD  Result Value Ref Range Status   Specimen Description BLOOD SITE NOT SPECIFIED  Final   Special Requests   Final    BOTTLES DRAWN AEROBIC AND ANAEROBIC Blood Culture results may not be optimal due to an inadequate volume of blood received in culture bottles   Culture  Setup Time   Final    GRAM POSITIVE COCCI IN BOTH AEROBIC AND ANAEROBIC BOTTLES CRITICAL VALUE NOTED.  VALUE IS CONSISTENT WITH PREVIOUSLY REPORTED AND CALLED VALUE.    Culture (A)  Final  STAPHYLOCOCCUS AUREUS SUSCEPTIBILITIES PERFORMED ON PREVIOUS CULTURE WITHIN THE LAST 5 DAYS. Performed at [facility], [place] [address] [place], [place] [address]    Report Status 01/08/2021 FINAL  Final  Resp Panel by RT-PCR (Flu A&B, Covid) Nasopharyngeal Swab     Status: None   Collection Time: 01/05/21  9:32 PM   Specimen: Nasopharyngeal Swab; Nasopharyngeal(NP) swabs in vial transport medium   Result Value Ref Range Status   SARS Coronavirus 2 by RT PCR NEGATIVE NEGATIVE Final    Comment: (NOTE) SARS-CoV-2 target nucleic acids are NOT DETECTED.  The SARS-CoV-2 RNA is generally detectable in upper respiratory specimens during the acute phase of infection. The lowest concentration of SARS-CoV-2 viral copies this assay can detect is 138 copies/mL. A negative result does not preclude SARS-Cov-2 infection and should not be used as the sole basis for treatment or other patient management decisions. A negative result may occur with  improper specimen collection/handling, submission of specimen other than nasopharyngeal swab, presence of viral mutation(s) within the areas targeted by this assay, and inadequate number of viral copies(<138 copies/mL). A negative result must be combined with clinical observations, patient history, and epidemiological information. The expected result is Negative.  Fact Sheet for Patients:  EntrepreneurPulse.com.au  Fact Sheet for Healthcare Providers:  IncredibleEmployment.be  This test is no t yet approved or cleared by the [place] FDA and  has been authorized for detection and/or diagnosis of SARS-CoV-2 by FDA under an Emergency Use Authorization (EUA). This EUA will remain  in effect (meaning this test can be used) for the duration of the COVID-19 declaration under Section 564(b)(1) of the Act, 21 U.S.C.section 360bbb-3(b)(1), unless the authorization is terminated  or revoked sooner.       Influenza A by PCR NEGATIVE NEGATIVE Final   Influenza B by PCR NEGATIVE NEGATIVE Final    Comment: (NOTE) The Xpert Xpress SARS-CoV-2/FLU/RSV plus assay is intended as an aid in the diagnosis of influenza from Nasopharyngeal swab specimens and should not be used as a sole basis for treatment. Nasal washings and aspirates are unacceptable for Xpert Xpress SARS-CoV-2/FLU/RSV testing.  Fact Sheet for  Patients: EntrepreneurPulse.com.au  Fact Sheet for Healthcare Providers: IncredibleEmployment.be  This test is not yet approved or cleared by the [place] FDA and has been authorized for detection and/or diagnosis of SARS-CoV-2 by FDA under an Emergency Use Authorization (EUA). This EUA will remain in effect (meaning this test can be used) for the duration of the COVID-19 declaration under Section 564(b)(1) of the Act, 21 U.S.C. section 360bbb-3(b)(1), unless the authorization is terminated or revoked.  Performed at [facility], [place] [address], [place], [place] [address]   Culture, blood (routine x 2)     Status: Abnormal   Collection Time: 01/07/21  6:52 AM   Specimen: BLOOD  Result Value Ref Range Status   Specimen Description BLOOD RIGHT ANTECUBITAL  Final   Special Requests   Final    BOTTLES DRAWN AEROBIC AND ANAEROBIC Blood Culture adequate volume   Culture  Setup Time   Final    GRAM POSITIVE COCCI IN CLUSTERS ANAEROBIC BOTTLE ONLY CRITICAL VALUE NOTED.  VALUE IS CONSISTENT WITH PREVIOUSLY REPORTED AND CALLED VALUE.    Culture (A)  Final    STAPHYLOCOCCUS AUREUS SUSCEPTIBILITIES PERFORMED ON PREVIOUS CULTURE WITHIN THE LAST 5 DAYS. Performed at [facility], [place] [address], [place], [place] [address]    Report Status 01/10/2021 FINAL  Final  Culture, blood (routine  x 2)     Status: None (Preliminary result)   Collection Time: 01/07/21  6:52 AM   Specimen: BLOOD RIGHT HAND  Result Value Ref Range Status   Specimen Description BLOOD RIGHT HAND  Final   Special Requests   Final    BOTTLES DRAWN AEROBIC ONLY Blood Culture results may not be optimal due to an inadequate volume of blood received in culture bottles   Culture   Final    NO GROWTH 3 DAYS Performed at [facility], [place] [address], [place], [place] [address]    Report Status PENDING  Incomplete  Aerobic/Anaerobic Culture w Gram Stain (surgical/deep  wound)     Status: None (Preliminary result)   Collection Time: 01/08/21 10:10 AM   Specimen: Wound  Result Value Ref Range Status   Specimen Description WOUND  Final   Special Requests NONE  Final   Gram Stain   Final    ABUNDANT WBC PRESENT, PREDOMINANTLY PMN NO ORGANISMS SEEN    Culture   Final    NO GROWTH 2 DAYS NO ANAEROBES ISOLATED; CULTURE IN PROGRESS FOR 5 DAYS Performed at [facility], [place] [address] [place], [place] [address]    Report Status PENDING  Incomplete  Culture, blood (Routine X 2) w Reflex to ID Panel     Status: None (Preliminary result)   Collection Time: 01/09/21 10:26 AM   Specimen: BLOOD  Result Value Ref Range Status   Specimen Description BLOOD RIGHT ANTECUBITAL  Final   Special Requests   Final    BOTTLES DRAWN AEROBIC AND ANAEROBIC Blood Culture adequate volume   Culture  Setup Time   Final    GRAM POSITIVE COCCI IN CLUSTERS ANAEROBIC BOTTLE ONLY CRITICAL VALUE NOTED.  VALUE IS CONSISTENT WITH PREVIOUSLY REPORTED AND CALLED VALUE. Performed at [facility], [place] [address] [place], [place] [address]    Culture GRAM POSITIVE COCCI  Final   Report Status PENDING  Incomplete  Culture, blood (Routine X 2) w Reflex to ID Panel     Status: None (Preliminary result)   Collection Time: 01/09/21 10:49 AM   Specimen: BLOOD LEFT HAND  Result Value Ref Range Status   Specimen Description BLOOD LEFT HAND  Final   Special Requests   Final    BOTTLES DRAWN AEROBIC AND ANAEROBIC Blood Culture results may not be optimal due to an inadequate volume of blood received in culture bottles   Culture  Setup Time   Final    GRAM POSITIVE COCCI IN CLUSTERS ANAEROBIC BOTTLE ONLY CRITICAL VALUE NOTED.  VALUE IS CONSISTENT WITH PREVIOUSLY REPORTED AND CALLED VALUE. Performed at [facility], [place] [address] [place], [place] [address]    Culture GRAM POSITIVE COCCI  Final   Report Status PENDING  Incomplete     RN Pressure Injury Documentation:      Estimated body mass index is 28.56 kg/m as calculated from the following:   Height as of 12/21/20: $RemoveBef'5\' 11"'hzDJBonVHc$  (1.803 m).   Weight as of this encounter: 92.9 kg.  Malnutrition Type:   Malnutrition Characteristics:   Nutrition Interventions:    Radiology Studies: ECHOCARDIOGRAM LIMITED  Result Date: 01/09/2021    ECHOCARDIOGRAM LIMITED REPORT   Patient Name:   TAQUAN BRALLEY  Date of Exam: 01/09/2021 Medical Rec #:  [id number]  Height:       71.0 in Accession #:    [id number] Weight:       215.2 lb Date of  Birth:  May 09, 1966   BSA:          2.175 m Patient Age:    16 years   BP:           153/88 mmHg Patient Gender: M          HR:           102 bpm. Exam Location:  Inpatient Procedure: Limited Color Doppler, Cardiac Doppler and Limited Echo STAT ECHO Indications:    abnormal ecg  History:        Patient has prior history of Echocardiogram examinations, most                 recent 01/07/2021. CHF, CAD, Signs/Symptoms:Bacteremia; Risk                 Factors:Hypertension.  Sonographer:    Johny Chess Referring Phys: [id number] [place]  1. Left ventricular ejection fraction, by estimation, is 55 to 60%. The left ventricle has normal function. The left ventricle has no regional wall motion abnormalities. There is mild concentric left ventricular hypertrophy.  2. Right ventricular systolic function is normal. The right ventricular size is normal.  3. The mitral valve is grossly normal. Trivial mitral valve regurgitation. No evidence of mitral stenosis.  4. The aortic valve is grossly normal. Aortic valve regurgitation is not visualized. No aortic stenosis is present.  5. The inferior vena cava is normal in size with greater than 50% respiratory variability, suggesting right atrial pressure of 3 mmHg. Comparison(s): No significant change from prior study. EF unchanged. No WMA. FINDINGS  Left Ventricle: Left ventricular ejection fraction, by estimation, is 55 to 60%. The left ventricle has  normal function. The left ventricle has no regional wall motion abnormalities. The left ventricular internal cavity size was normal in size. There is  mild concentric left ventricular hypertrophy. Abnormal (paradoxical) septal motion consistent with post-operative status. Right Ventricle: The right ventricular size is normal. No increase in right ventricular wall thickness. Right ventricular systolic function is normal. Mitral Valve: The mitral valve is grossly normal. Trivial mitral valve regurgitation. No evidence of mitral valve stenosis. Tricuspid Valve: The tricuspid valve is grossly normal. Tricuspid valve regurgitation is not demonstrated. No evidence of tricuspid stenosis. Aortic Valve: The aortic valve is grossly normal. Aortic valve regurgitation is not visualized. No aortic stenosis is present. Pulmonic Valve: The pulmonic valve was not well visualized. Aorta: The aortic root is normal in size and structure. Venous: The inferior vena cava is normal in size with greater than 50% respiratory variability, suggesting right atrial pressure of 3 mmHg. LEFT VENTRICLE PLAX 2D LVIDd:         4.80 cm     Diastology LVIDs:         3.70 cm     LV e' medial:  6.09 cm/s LV PW:         1.20 cm     LV e' lateral: 18.60 cm/s LV IVS:        1.30 cm  LV Volumes (MOD) LV vol d, MOD A4C: 82.4 ml LV vol s, MOD A4C: 46.0 ml LV SV MOD A4C:     82.4 ml AORTIC VALVE LVOT Vmax:   101.00 cm/s LVOT Vmean:  66.700 cm/s LVOT VTI:    0.156 m  SHUNTS Systemic VTI: 0.16 m Eleonore Chiquito MD Electronically signed by Eleonore Chiquito MD Signature Date/Time: 01/09/2021/12:55:44 PM    Final     Scheduled Meds:  aspirin EC  81 mg Oral Daily   atorvastatin  40 mg Oral QHS   enoxaparin (LOVENOX) injection  40 mg Subcutaneous QHS   fluconazole  100 mg Oral Daily   gabapentin  200 mg Oral TID   insulin aspart  0-9 Units Subcutaneous Q4H   lactulose  10 g Oral Daily   losartan  25 mg Oral Daily   metoprolol succinate  25 mg Oral Daily    nystatin  5 mL Oral QID   oxyCODONE  20 mg Oral Q12H   pantoprazole  40 mg Oral Daily   polyethylene glycol  17 g Oral BID   venlafaxine XR  75 mg Oral q morning   Continuous Infusions:  sodium chloride     sodium chloride     vancomycin 1,250 mg (01/10/21 1129)    LOS: 4 days   Kerney Elbe, DO [facility] PAGER is on AMION  If 7PM-7AM, please contact night-coverage [email or web address]

## 2021-01-11 ENCOUNTER — Inpatient Hospital Stay (HOSPITAL_COMMUNITY): Payer: Federal, State, Local not specified - PPO | Admitting: Anesthesiology

## 2021-01-11 ENCOUNTER — Encounter (HOSPITAL_COMMUNITY): Payer: Self-pay | Admitting: Family Medicine

## 2021-01-11 ENCOUNTER — Inpatient Hospital Stay (HOSPITAL_COMMUNITY): Payer: Federal, State, Local not specified - PPO

## 2021-01-11 ENCOUNTER — Encounter (HOSPITAL_COMMUNITY)
Admission: EM | Disposition: A | Discharge status: Rehabilitation Facility | Payer: Self-pay | Source: Home / Self Care | Attending: Internal Medicine

## 2021-01-11 DIAGNOSIS — R7881 Bacteremia: Secondary | ICD-10-CM | POA: Diagnosis not present

## 2021-01-11 DIAGNOSIS — I2510 Atherosclerotic heart disease of native coronary artery without angina pectoris: Secondary | ICD-10-CM | POA: Diagnosis not present

## 2021-01-11 DIAGNOSIS — G062 Extradural and subdural abscess, unspecified: Secondary | ICD-10-CM | POA: Diagnosis not present

## 2021-01-11 DIAGNOSIS — B9562 Methicillin resistant Staphylococcus aureus infection as the cause of diseases classified elsewhere: Secondary | ICD-10-CM | POA: Diagnosis not present

## 2021-01-11 DIAGNOSIS — F419 Anxiety disorder, unspecified: Secondary | ICD-10-CM | POA: Diagnosis not present

## 2021-01-11 HISTORY — PX: TEE WITHOUT CARDIOVERSION: SHX5443

## 2021-01-11 LAB — GLUCOSE, CAPILLARY
Glucose-Capillary: 130 mg/dL — ABNORMAL HIGH (ref 70–99)
Glucose-Capillary: 153 mg/dL — ABNORMAL HIGH (ref 70–99)
Glucose-Capillary: 155 mg/dL — ABNORMAL HIGH (ref 70–99)
Glucose-Capillary: 164 mg/dL — ABNORMAL HIGH (ref 70–99)
Glucose-Capillary: 175 mg/dL — ABNORMAL HIGH (ref 70–99)
Glucose-Capillary: 191 mg/dL — ABNORMAL HIGH (ref 70–99)

## 2021-01-11 LAB — CBC WITH DIFFERENTIAL/PLATELET
Abs Immature Granulocytes: 0 10*3/uL (ref 0.00–0.07)
Basophils Absolute: 0 10*3/uL (ref 0.0–0.1)
Basophils Relative: 0 %
Eosinophils Absolute: 0 10*3/uL (ref 0.0–0.5)
Eosinophils Relative: 0 %
HCT: 31.5 % — ABNORMAL LOW (ref 39.0–52.0)
Hemoglobin: 10.5 g/dL — ABNORMAL LOW (ref 13.0–17.0)
Lymphocytes Relative: 0 %
Lymphs Abs: 0 10*3/uL — ABNORMAL LOW (ref 0.7–4.0)
MCH: 28.8 pg (ref 26.0–34.0)
MCHC: 33.3 g/dL (ref 30.0–36.0)
MCV: 86.3 fL (ref 80.0–100.0)
Monocytes Absolute: 0.8 10*3/uL (ref 0.1–1.0)
Monocytes Relative: 3 %
Neutro Abs: 25.7 10*3/uL — ABNORMAL HIGH (ref 1.7–7.7)
Neutrophils Relative %: 97 %
Platelets: 671 10*3/uL — ABNORMAL HIGH (ref 150–400)
RBC: 3.65 MIL/uL — ABNORMAL LOW (ref 4.22–5.81)
RDW: 15.5 % (ref 11.5–15.5)
WBC: 26.5 10*3/uL — ABNORMAL HIGH (ref 4.0–10.5)
nRBC: 0 % (ref 0.0–0.2)
nRBC: 0 /100 WBC

## 2021-01-11 LAB — COMPREHENSIVE METABOLIC PANEL
ALT: 20 U/L (ref 0–44)
AST: 27 U/L (ref 15–41)
Albumin: 1.4 g/dL — ABNORMAL LOW (ref 3.5–5.0)
Alkaline Phosphatase: 110 U/L (ref 38–126)
Anion gap: 10 (ref 5–15)
BUN: 6 mg/dL (ref 6–20)
CO2: 24 mmol/L (ref 22–32)
Calcium: 7.8 mg/dL — ABNORMAL LOW (ref 8.9–10.3)
Chloride: 96 mmol/L — ABNORMAL LOW (ref 98–111)
Creatinine, Ser: 0.38 mg/dL — ABNORMAL LOW (ref 0.61–1.24)
GFR, Estimated: >60 mL/min (ref >60)
Glucose, Bld: 130 mg/dL — ABNORMAL HIGH (ref 70–99)
Potassium: 3.7 mmol/L (ref 3.5–5.1)
Sodium: 130 mmol/L — ABNORMAL LOW (ref 135–145)
Total Bilirubin: 0.9 mg/dL (ref 0.3–1.2)
Total Protein: 5.6 g/dL — ABNORMAL LOW (ref 6.5–8.1)

## 2021-01-11 LAB — MAGNESIUM: Magnesium: 1.7 mg/dL (ref 1.7–2.4)

## 2021-01-11 LAB — PHOSPHORUS: Phosphorus: 3.1 mg/dL (ref 2.5–4.6)

## 2021-01-11 SURGERY — ECHOCARDIOGRAM, TRANSESOPHAGEAL
Anesthesia: Monitor Anesthesia Care

## 2021-01-11 SURGERY — Surgical Case
Anesthesia: *Unknown

## 2021-01-11 MED ORDER — SODIUM CHLORIDE 0.9 % IV SOLN
INTRAVENOUS | Status: AC | PRN
  Administered 2021-01-11: 500 mL via INTRAVENOUS

## 2021-01-11 MED ORDER — LIDOCAINE 2% (20 MG/ML) 5 ML SYRINGE
INTRAMUSCULAR | Status: DC | PRN
  Administered 2021-01-11: 40 mg via INTRAVENOUS

## 2021-01-11 MED ORDER — PROPOFOL 10 MG/ML IV BOLUS
INTRAVENOUS | Status: DC | PRN
  Administered 2021-01-11: 30 mg via INTRAVENOUS

## 2021-01-11 MED ORDER — MAGNESIUM SULFATE 2 GM/50ML IV SOLN
2.0000 g | Freq: Once | INTRAVENOUS | Status: AC
  Administered 2021-01-11: 2 g via INTRAVENOUS
  Filled 2021-01-11: qty 50

## 2021-01-11 MED ORDER — PHENYLEPHRINE 40 MCG/ML (10ML) SYRINGE FOR IV PUSH (FOR BLOOD PRESSURE SUPPORT)
PREFILLED_SYRINGE | INTRAVENOUS | Status: DC | PRN
  Administered 2021-01-11: 80 ug via INTRAVENOUS

## 2021-01-11 MED ORDER — PROPOFOL 500 MG/50ML IV EMUL
INTRAVENOUS | Status: DC | PRN
  Administered 2021-01-11: 100 ug/kg/min via INTRAVENOUS

## 2021-01-11 NOTE — Progress Notes (Signed)
[facility]  Date of Admission:  01/05/2021     CC: Mrsa bacteremia  Lines: Peripheral iv's  Abx: 7/14-c vanc    ASSESSMENT: 55 yo male recent mrsa bacteremia/epidural abscess/OM and psoas abscess s/p open debridement 6/28 and IR drainage of left psoas discharged on daptomycin, now readmitted 7/13 for worsening back pain of a few days found to have recurrent mrsa bacteremia  7/13 & 7/15 bcx mrsa  7/17 bcx ngtd  7/13 mri lumbar/thoracic showed several areas of fluid collection with a more loculated focus at L4-5. There is no focal LE neurologic deficit or urinary/bowel incontinence. NSG reviewed case and felt no urgent decompression at this time  7/16 IR reviewed case and percutaneously drained the 5 cm L3 level subdural fluid collection.  7/17 tte no vegetation  Patient remains with significant leukocytosis. His fever so far had resolved.   Concern for drug resistance vs source control, and with recurrent mrsa bsi endocarditis  --------------- 7/19 assessment Bcx from 7/17 now growing staph aureus as well Ongoing back pain severe Wbc a little down Vanc subtherapeutic Getting tee  PLAN: F/u blood cx susceptibility for linezolid/dapto/ceptaroline Continue vanc -- discussed with ID pharmacy regarding options --> vanc dosing adjusted. If persistent bacteremia, would consider dapto/ceftaroline combination F/u tee If tee negative, will discussed with nsg regarding reevaluating the back for I&D of abscess for source control Discussed with primary team  I spent more than 35 minute reviewing data/chart, and coordinating care and >50% direct face to face time providing counseling/discussing diagnostics/treatment plan with patient     Principal Problem:   MRSA bacteremia Active Problems:   CAD (coronary artery disease)   HTN (hypertension)   Uncontrolled diabetes mellitus (HCC)   Epidural abscess   Normocytic anemia    Hyponatremia   Anxiety   Chronic diastolic CHF (congestive heart failure) (HCC)   No Known Allergies  Scheduled Meds:  [MAR Hold] aspirin EC  81 mg Oral Daily   [MAR Hold] atorvastatin  40 mg Oral QHS   [MAR Hold] enoxaparin (LOVENOX) injection  40 mg Subcutaneous QHS   [MAR Hold] fluconazole  100 mg Oral Daily   [MAR Hold] gabapentin  200 mg Oral TID   [MAR Hold] insulin aspart  0-9 Units Subcutaneous Q4H   [MAR Hold] lactulose  10 g Oral Daily   [MAR Hold] losartan  25 mg Oral Daily   [MAR Hold] metoprolol succinate  25 mg Oral Daily   [MAR Hold] nystatin  5 mL Oral QID   [MAR Hold] oxyCODONE  20 mg Oral Q12H   [MAR Hold] pantoprazole  40 mg Oral Daily   [MAR Hold] polyethylene glycol  17 g Oral BID   [MAR Hold] venlafaxine XR  75 mg Oral q morning   Continuous Infusions:  sodium chloride 20 mL/hr at 01/10/21 1713   sodium chloride     [MAR Hold] sodium chloride     [MAR Hold] sodium chloride     [MAR Hold] vancomycin 1,250 mg (01/11/21 0436)   PRN Meds:.sodium chloride, [MAR Hold] acetaminophen **OR** [MAR Hold] acetaminophen, [MAR Hold] ALPRAZolam, [MAR Hold] alum & mag hydroxide-simeth, [MAR Hold] diclofenac Sodium, [MAR Hold]  HYDROmorphone (DILAUDID) injection, [MAR Hold] labetalol, [MAR Hold] lidocaine, [MAR Hold] ondansetron **OR** [MAR Hold] ondansetron (ZOFRAN) IV   SUBJECTIVE: Pendign susceptibility 7/17 bcx positive staph aureus Back pain remains severe No fever/chill Wbc down   No n/v/diarrhea No headache or visual change  Review  of Systems: ROS All other ROS was negative, except mentioned above     OBJECTIVE: Vitals:   01/11/21 0200 01/11/21 0300 01/11/21 0700 01/11/21 1100  BP:  (!) 149/88 (!) 151/95 (!) 146/94  Pulse: (!) 105 (!) 103 (!) 107 (!) 106  Resp: 18 20 20 17   Temp:  98.6 F (37 C) 99.3 F (37.4 C) 99.3 F (37.4 C)  TempSrc:  Axillary Axillary Oral  SpO2: 100% 100% 100% 97%  Weight:       Body mass index is 28.56  kg/m.  Physical Exam General/constitutional: mild-mod distress, cooperative HEENT: Normocephalic, PER, Conj Clear, EOMI, Oropharynx clear Neck supple CV: rrr no mrg Lungs: clear to auscultation, normal respiratory effort Abd: Soft, Nontender Ext: no edema Skin: No Rash Neuro: nonfocal MSK: tender lower back spine   Lab Results Lab Results  Component Value Date   WBC 26.5 (H) 01/11/2021   HGB 10.5 (L) 01/11/2021   HCT 31.5 (L) 01/11/2021   MCV 86.3 01/11/2021   PLT 671 (H) 01/11/2021    Lab Results  Component Value Date   CREATININE 0.38 (L) 01/11/2021   BUN 6 01/11/2021   NA 130 (L) 01/11/2021   K 3.7 01/11/2021   CL 96 (L) 01/11/2021   CO2 24 01/11/2021    Lab Results  Component Value Date   ALT 20 01/11/2021   AST 27 01/11/2021   ALKPHOS 110 01/11/2021   BILITOT 0.9 01/11/2021      Microbiology: Recent Results (from the past 240 hour(s))  Blood culture (routine x 2)     Status: Abnormal (Preliminary result)   Collection Time: 01/05/21  5:20 PM   Specimen: BLOOD  Result Value Ref Range Status   Specimen Description BLOOD SITE NOT SPECIFIED  Final   Special Requests   Final    BOTTLES DRAWN AEROBIC AND ANAEROBIC Blood Culture adequate volume   Culture  Setup Time   Final    GRAM POSITIVE COCCI IN BOTH AEROBIC AND ANAEROBIC BOTTLES CRITICAL VALUE NOTED.  VALUE IS CONSISTENT WITH PREVIOUSLY REPORTED AND CALLED VALUE.    Culture (A)  Final    METHICILLIN RESISTANT STAPHYLOCOCCUS AUREUS Sent to [facility] for further susceptibility testing. Performed at [facility], [address] [place], [place] [id number]    Report Status PENDING  Incomplete   Organism ID, Bacteria METHICILLIN RESISTANT STAPHYLOCOCCUS AUREUS  Final      Susceptibility   Methicillin resistant staphylococcus aureus - MIC*    CIPROFLOXACIN >=8 RESISTANT Resistant     ERYTHROMYCIN >=8 RESISTANT Resistant     GENTAMICIN <=0.5 SENSITIVE Sensitive     OXACILLIN >=4 RESISTANT Resistant      TETRACYCLINE <=1 SENSITIVE Sensitive     VANCOMYCIN 1 SENSITIVE Sensitive     TRIMETH/SULFA >=320 RESISTANT Resistant     CLINDAMYCIN <=0.25 SENSITIVE Sensitive     RIFAMPIN <=0.5 SENSITIVE Sensitive     Inducible Clindamycin NEGATIVE Sensitive     * METHICILLIN RESISTANT STAPHYLOCOCCUS AUREUS  Blood culture (routine x 2)     Status: Abnormal   Collection Time: 01/05/21  6:10 PM   Specimen: BLOOD  Result Value Ref Range Status   Specimen Description BLOOD SITE NOT SPECIFIED  Final   Special Requests   Final    BOTTLES DRAWN AEROBIC AND ANAEROBIC Blood Culture results may not be optimal due to an inadequate volume of blood received in culture bottles   Culture  Setup Time   Final    GRAM POSITIVE COCCI  IN BOTH AEROBIC AND ANAEROBIC BOTTLES CRITICAL VALUE NOTED.  VALUE IS CONSISTENT WITH PREVIOUSLY REPORTED AND CALLED VALUE.    Culture (A)  Final    STAPHYLOCOCCUS AUREUS SUSCEPTIBILITIES PERFORMED ON PREVIOUS CULTURE WITHIN THE LAST 5 DAYS. Performed at [facility], [address] [place], [place] [address]    Report Status 01/08/2021 FINAL  Final  Resp Panel by RT-PCR (Flu A&B, Covid) Nasopharyngeal Swab     Status: None   Collection Time: 01/05/21  9:32 PM   Specimen: Nasopharyngeal Swab; Nasopharyngeal(NP) swabs in vial transport medium  Result Value Ref Range Status   SARS Coronavirus 2 by RT PCR NEGATIVE NEGATIVE Final    Comment: (NOTE) SARS-CoV-2 target nucleic acids are NOT DETECTED.  The SARS-CoV-2 RNA is generally detectable in upper respiratory specimens during the acute phase of infection. The lowest concentration of SARS-CoV-2 viral copies this assay can detect is 138 copies/mL. A negative result does not preclude SARS-Cov-2 infection and should not be used as the sole basis for treatment or other patient management decisions. A negative result may occur with  improper specimen collection/handling, submission of specimen other than nasopharyngeal swab,  presence of viral mutation(s) within the areas targeted by this assay, and inadequate number of viral copies(<138 copies/mL). A negative result must be combined with clinical observations, patient history, and epidemiological information. The expected result is Negative.  Fact Sheet for Patients:  BloggerCourse.com  Fact Sheet for Healthcare Providers:  SeriousBroker.it  This test is no t yet approved or cleared by the [place] FDA and  has been authorized for detection and/or diagnosis of SARS-CoV-2 by FDA under an Emergency Use Authorization (EUA). This EUA will remain  in effect (meaning this test can be used) for the duration of the COVID-19 declaration under Section 564(b)(1) of the Act, 21 U.S.C.section 360bbb-3(b)(1), unless the authorization is terminated  or revoked sooner.       Influenza A by PCR NEGATIVE NEGATIVE Final   Influenza B by PCR NEGATIVE NEGATIVE Final    Comment: (NOTE) The Xpert Xpress SARS-CoV-2/FLU/RSV plus assay is intended as an aid in the diagnosis of influenza from Nasopharyngeal swab specimens and should not be used as a sole basis for treatment. Nasal washings and aspirates are unacceptable for Xpert Xpress SARS-CoV-2/FLU/RSV testing.  Fact Sheet for Patients: BloggerCourse.com  Fact Sheet for Healthcare Providers: SeriousBroker.it  This test is not yet approved or cleared by the [place] FDA and has been authorized for detection and/or diagnosis of SARS-CoV-2 by FDA under an Emergency Use Authorization (EUA). This EUA will remain in effect (meaning this test can be used) for the duration of the COVID-19 declaration under Section 564(b)(1) of the Act, 21 U.S.C. section 360bbb-3(b)(1), unless the authorization is terminated or revoked.  Performed at [facility], [address], [place], [place] [address]   Culture, blood  (routine x 2)     Status: Abnormal   Collection Time: 01/07/21  6:52 AM   Specimen: BLOOD  Result Value Ref Range Status   Specimen Description BLOOD RIGHT ANTECUBITAL  Final   Special Requests   Final    BOTTLES DRAWN AEROBIC AND ANAEROBIC Blood Culture adequate volume   Culture  Setup Time   Final    GRAM POSITIVE COCCI IN CLUSTERS ANAEROBIC BOTTLE ONLY CRITICAL VALUE NOTED.  VALUE IS CONSISTENT WITH PREVIOUSLY REPORTED AND CALLED VALUE.    Culture (A)  Final    STAPHYLOCOCCUS AUREUS SUSCEPTIBILITIES PERFORMED ON PREVIOUS CULTURE WITHIN THE  LAST 5 DAYS. Performed at [facility], [address], [place], [place] [address]    Report Status 01/10/2021 FINAL  Final  Culture, blood (routine x 2)     Status: None (Preliminary result)   Collection Time: 01/07/21  6:52 AM   Specimen: BLOOD RIGHT HAND  Result Value Ref Range Status   Specimen Description BLOOD RIGHT HAND  Final   Special Requests   Final    BOTTLES DRAWN AEROBIC ONLY Blood Culture results may not be optimal due to an inadequate volume of blood received in culture bottles   Culture   Final    NO GROWTH 4 DAYS Performed at [facility], [address], [place], [place] [address]    Report Status PENDING  Incomplete  Aerobic/Anaerobic Culture w Gram Stain (surgical/deep wound)     Status: None (Preliminary result)   Collection Time: 01/08/21 10:10 AM   Specimen: Wound  Result Value Ref Range Status   Specimen Description WOUND  Final   Special Requests NONE  Final   Gram Stain   Final    ABUNDANT WBC PRESENT, PREDOMINANTLY PMN NO ORGANISMS SEEN    Culture   Final    NO GROWTH 3 DAYS NO ANAEROBES ISOLATED; CULTURE IN PROGRESS FOR 5 DAYS Performed at [facility], [address] [place], [place] [address]    Report Status PENDING  Incomplete  Culture, blood (Routine X 2) w Reflex to ID Panel     Status: Abnormal (Preliminary result)   Collection Time: 01/09/21 10:26 AM   Specimen: BLOOD  Result  Value Ref Range Status   Specimen Description BLOOD RIGHT ANTECUBITAL  Final   Special Requests   Final    BOTTLES DRAWN AEROBIC AND ANAEROBIC Blood Culture adequate volume   Culture  Setup Time   Final    GRAM POSITIVE COCCI IN CLUSTERS ANAEROBIC BOTTLE ONLY CRITICAL VALUE NOTED.  VALUE IS CONSISTENT WITH PREVIOUSLY REPORTED AND CALLED VALUE.    Culture (A)  Final    STAPHYLOCOCCUS AUREUS SUSCEPTIBILITIES PERFORMED ON PREVIOUS CULTURE WITHIN THE LAST 5 DAYS. Performed at [facility], [address] [place], [place] [address]    Report Status PENDING  Incomplete  Culture, blood (Routine X 2) w Reflex to ID Panel     Status: Abnormal (Preliminary result)   Collection Time: 01/09/21 10:49 AM   Specimen: BLOOD LEFT HAND  Result Value Ref Range Status   Specimen Description BLOOD LEFT HAND  Final   Special Requests   Final    BOTTLES DRAWN AEROBIC AND ANAEROBIC Blood Culture results may not be optimal due to an inadequate volume of blood received in culture bottles   Culture  Setup Time   Final    GRAM POSITIVE COCCI IN CLUSTERS ANAEROBIC BOTTLE ONLY CRITICAL VALUE NOTED.  VALUE IS CONSISTENT WITH PREVIOUSLY REPORTED AND CALLED VALUE.    Culture (A)  Final    STAPHYLOCOCCUS AUREUS SUSCEPTIBILITIES PERFORMED ON PREVIOUS CULTURE WITHIN THE LAST 5 DAYS. Performed at [facility], [address], [place], [place] [address]    Report Status PENDING  Incomplete     Serology:   Imaging: If present, new imagings (plain films, ct scans, and mri) have been personally visualized and interpreted; radiology reports have been reviewed. Decision making incorporated into the Impression / Recommendations.  7/13 mri thoracic lumbar Paraspinal and other soft tissues: Patient is status post left laminectomy at L3. Fluid tracks from the laminectomy  site into a subcutaneous and deep soft tissue collection with peripheral enhancement measuring 4.7 x 5.0 x 1.7 cm. The ventral  epidural collection extends inferiorly to the L2 level. Separate prominent ventral collection is present posterior to L4 prominently right the left. This collection measures 2.9 x 1.3 cm on the sagittal images.   Disc levels:   In addition to the epidural collections, disc disease is again noted at L4-5 and L5-S1. Central foraminal narrowing are associated.   IMPRESSION: 1. T7 laminectomy subcutaneous peripherally enhancing fluid collection at this level as well. 2. Residual posterior epidural fluid collection in the thoracic spine with similar cephalo caudad extension from T5-6-T9 but less mass effect. 3. Progressive signal abnormality and enhancement throughout the T7 and T8 vertebral bodies in the T7-8 thoracic disc consistent with disc osteomyelitis. 4. Progressive paraspinous soft tissue enhancement at T3-4 through T9-10 without drainable collection. 5. Persistent extensive ventral epidural collection compatible with abscess. 6. Status post left laminectomy at L3 with peripherally enhancing fluid collection extending from the operative site into the subcutaneous and deep tissues as described. This is concerning abscess. 7. Fluid tracks from the laminectomy site into a subdural deep soft tissue collection measuring 4.7 x 5.0 x 1.7 cm. 8. Separate ventral epidural collection at L4-5 and L5-S1 consistent with epidural abscess. 9. Progressive signal abnormality and enhancement in the L3 and L4 vertebral bodies. 10. 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   7/17 tte  1. Left ventricular ejection fraction, by estimation, is 55 to 60%. The  left ventricle has normal function. The left ventricle has no regional  wall motion abnormalities. There is mild concentric left ventricular  hypertrophy.   2. Right ventricular systolic  function is normal. The right ventricular  size is normal.   3. The mitral valve is grossly normal. Trivial mitral valve  regurgitation. No evidence of mitral stenosis.   4. The aortic valve is grossly normal. Aortic valve regurgitation is not  visualized. No aortic stenosis is present.   5. The inferior vena cava is normal in size with greater than 50%  respiratory variability, suggesting right atrial pressure of 3 mmHg.    Raymondo Band, MD [facility] [phone] pager    01/11/2021, 12:41 PM

## 2021-01-11 NOTE — Interval H&P Note (Signed)
History and Physical Interval Note:  01/11/2021 11:56 AM  Louis [name]  has presented today for surgery, with the diagnosis of BACTEREMIA.  The various methods of treatment have been discussed with the patient and family. After consideration of risks, benefits and other options for treatment, the patient has consented to  Procedure(s): TRANSESOPHAGEAL ECHOCARDIOGRAM (TEE) (N/A) as a surgical intervention.  The patient's history has been reviewed, patient examined, no change in status, stable for surgery.  I have reviewed the patient's chart and labs.  Questions were answered to the patient's satisfaction.     Olga Millers

## 2021-01-11 NOTE — Progress Notes (Signed)
  Echocardiogram Echocardiogram Transesophageal has been performed.  Augustine Radar 01/11/2021, 1:24 PM

## 2021-01-11 NOTE — Anesthesia Postprocedure Evaluation (Signed)
Anesthesia Post Note  Patient: Louis [name]  Procedure(s) Performed: TRANSESOPHAGEAL ECHOCARDIOGRAM (TEE)     Patient location during evaluation: Endoscopy Anesthesia Type: MAC Level of consciousness: awake and alert Pain management: pain level controlled Vital Signs Assessment: post-procedure vital signs reviewed and stable Respiratory status: spontaneous breathing, nonlabored ventilation, respiratory function stable and patient connected to nasal cannula oxygen Cardiovascular status: stable and blood pressure returned to baseline Postop Assessment: no apparent nausea or vomiting Anesthetic complications: no   No notable events documented.  Last Vitals:  Vitals:   01/11/21 1356 01/11/21 1619  BP: 124/84 124/84  Pulse: 97   Resp: 16   Temp: 36.8 C 36.9 C  SpO2: 94% 100%    Last Pain:  Vitals:   01/11/21 1619  TempSrc: Oral  PainSc:                  Pelzer

## 2021-01-11 NOTE — Anesthesia Procedure Notes (Signed)
Procedure Name: MAC Date/Time: 01/11/2021 12:34 PM Performed by: Fulton Reek, CRNA Pre-anesthesia Checklist: Patient identified, Emergency Drugs available, Suction available, Patient being monitored and Timeout performed Patient Re-evaluated:Patient Re-evaluated prior to induction Oxygen Delivery Method: Nasal cannula Preoxygenation: Pre-oxygenation with 100% oxygen Induction Type: IV induction Dental Injury: Teeth and Oropharynx as per pre-operative assessment

## 2021-01-11 NOTE — Anesthesia Preprocedure Evaluation (Signed)
Anesthesia Evaluation  Patient identified by MRN, date of birth, ID band Patient awake    Reviewed: Allergy & Precautions, H&P , NPO status , Patient's Chart, lab work & pertinent test results  Airway Mallampati: II   Neck ROM: full    Dental   Pulmonary Current Smoker,    breath sounds clear to auscultation       Cardiovascular hypertension, + CAD and +CHF   Rhythm:regular Rate:Normal  bacteremia   Neuro/Psych Anxiety  Neuromuscular disease    GI/Hepatic   Endo/Other  diabetes, Type 2  Renal/GU      Musculoskeletal   Abdominal   Peds  Hematology  (+) Blood dyscrasia, anemia ,   Anesthesia Other Findings   Reproductive/Obstetrics                            Anesthesia Physical Anesthesia Plan  ASA: 2  Anesthesia Plan: MAC   Post-op Pain Management:    Induction: Intravenous  PONV Risk Score and Plan: 0 and Propofol infusion, Ondansetron and Treatment may vary due to age or medical condition  Airway Management Planned: Nasal Cannula  Additional Equipment:   Intra-op Plan:   Post-operative Plan:   Informed Consent: I have reviewed the patients History and Physical, chart, labs and discussed the procedure including the risks, benefits and alternatives for the proposed anesthesia with the patient or authorized representative who has indicated his/her understanding and acceptance.     Dental advisory given  Plan Discussed with: CRNA and Anesthesiologist  Anesthesia Plan Comments:        Anesthesia Quick Evaluation

## 2021-01-11 NOTE — Progress Notes (Signed)
PROGRESS NOTE    Louis [name]  ERX:[id number] DOB: September 20, 1965 DOA: 01/05/2021 PCP: Ray Church, NP   Brief Narrative:  Patient is a 55 year old Caucasian male with a past medical history significant for but not limited to type 2 diabetes mellitus, hypertension, history of CAD with CABG in 2017, anxiety, chronic pain as well as other comorbidities with recent admission with an epidural abscess, vertebral osteomyelitis, psoas abscess and MRSA bacteremia now returning to the ED with worsening back pain.  He reported that his back pain has been stable until approximately 5 days ago when it became more severe and progressively worsened since then.  He did not notice any new leg numbness or weakness did have some urinary incontinence.  Back pain was reportedly severe and constant worse with movement and radiating to both his legs.  He denied any fevers or chills but stated he did have some mild drainage from his surgical wounds.  He missed his dose of antibiotics yesterday but has not missed any other doses.  He been admitted on 12/12/2020 after he is found to have an epidural abscess and vertebral osteomyelitis and after over there is no neurosurgery or ID backup.  He underwent a laminectomy and evacuation of abscess on 12/13/2020 and then drainage of the psoas abscess by IR on 12/22/2020.  Blood cultures from 12/14/2018 grew out MRSA but there is no vegetation seen on TTE and blood cultures were drawn and repeated 12/15/2020 remain negative.  He has been on daptomycin at home.  Upon arrival to ED is found to be saturating well on room air but he did have a leukocytosis 23,900 and platelet count of 681,000.  COVID PCR was negative.  He had an MRI of the thoracic and lumbar spine which revealed multiple fluid collections involving the ventral epidural abscess at the L4-L5 level with progressive discitis and osteomyelitis at the L4-L5 and L5-S1 levels.  Blood cultures were repeated in the ED and he was given multiple  doses of IV analgesics and he is evaluated by neurosurgery who recommended medical admission.  Initial plan was for lumbar decompression however Dr. Reatha Armour of neurosurgery evaluated and felt that his thoracic mass-effect was improved and that even though there was persistent ostial discitis and epidural abscess throughout the thoracolumbar spine there is possibly a more loculated focus of the abscess at L4-L5 with stenosis but no focal weakness, groin numbness or numbness and tingling in his legs he did not believe that urgent or emergent decompression was needed at this time.  Patient has been confused more often per his wife and neurosurgery is obtaining MRI of the brain with and without contrast given concern of some cerebral spread of infection.  Because he has failed multiple medical treatments will likely need broaden antibiotic therapy and will consult ID and neurosurgery feels that the stenosis L4-L5 could be decompressed however neurologically patient is doing well with no focal deficits and therefore they are recommending aggressive medical and supportive care.  ID Changed to IV Vancomycin and consulted IR for drainage of the deep soft tissue collection with peripheral enhancement measuring 4.7 x 5.0 x 1.7 cm.  The image guided drainage was done today by Dr. Vernard Gambles.  Dr. Reatha Armour of neurosurgery recommending continuing vancomycin and aggressive medical care.  Patient has some nausea today and an abnormal EKG so cardiology was consulted for further evaluation and stat troponins were ordered and they were negative.  Cardiology evaluated and felt that we should repeat a limited echo.  Limited echo  done and showed no wall motion abnormalities.  There is no evidence of STEMI.   The patient is to undergo a TEE on Tuesday, 01/11/2021.  ID recommending following blood culture susceptibility.  Per ID if WBC remains high and the susceptibility indicates daptomycin remain sensitive they are recommending that we  need to discuss with neurosurgery given regarding debridement for source control.  Assessment & Plan:   Principal Problem:   MRSA bacteremia Active Problems:   CAD (coronary artery disease)   HTN (hypertension)   Uncontrolled diabetes mellitus (HCC)   Epidural abscess   Normocytic anemia   Hyponatremia   Anxiety   Chronic diastolic CHF (congestive heart failure) (HCC)  Sepsis secondary to epidural abscess; discitis/osteomyelitis; MRSA bacteremia     -Patient met sepsis physiology on admission as he was tachycardic, tachypneic, had a leukocytosis and a source of Infection  -Pt with laminectomy & evacuation of abscess 12/21/20, IR drainage of psoas abscess 6/29, and MRSA bacteremia currently on daptomycin presents with worsening back pain and found on MRI L- and T-spine to have multiple fluid collections including ventral epidural abscess and progressive osteomyelitis at L 4-5 -Blood cultures grew MRSA on 6/20, were negative 6/22, no vegetation seen on TTE 12/15/20; Now has persistently Positive Blood Cx as they are Positive 7/13, 7/14, and 7/17 with MRSA  - Neurosurgery was planning for L4-5 decompression 01/06/21 but re-evaluated and since patient is Neurologically intact will hold off and treat medically do not feel that urgent or emergent decompression is needed at this time -Give another 1-1/2 L bolus today and start the patient on sodium bicarb at 75 MLS per hour -Patient continues to be tachycardic and will continue with metoprolol tartrate 25 mg p.o. nightly and also start labetalol IV -WBC is gone from 23.9 is now 31.7 -> 30.5 and is further improved to 27.4 yesterday but bumped up the day before yesterday. WBC remains elevated and went from 32.1 -> 31.0 -> 26.5 -CRP went from 31.1 -> 27.8 and is further improved to 21.8 x2 -Continue IV antibiotics, and pain-control  -Check a UDS -We will formally consult ID for further evaluation recommendations; ID recommending stopping daptomycin  and switching to vancomycin per pharmacy to dose.  We will order a TTE and if this is negative proceeding with a TEE and it was done 01/11/21 -ID is also ordered a CT of the pelvis with contrast to further evaluate reactivation of psoas abscess and they are repeating blood cultures and removed his PICC line; CT of the pelvis showed "No pelvic abscess or acute osseous abnormality identified. Trace free fluid in the deep pelvis, likely related to anasarca. Subtle residual of the left psoas abscess at the L5 level which was drained last month. Infected lumbar levels minimally included on this exam. See Lumbar MRI 01/05/2021." -ID is now consulted interventional radiology for the fluid collection mentioned in the deep subdural soft tissue measuring 4.7 x 5.0 x 1.7 cm; patient underwent drainage on 01/08/21 by by Dr. Vernard Gambles -ID is now also sending susceptibility testing to daptomycin, ceftrarloibne and linezolid; ID is recommending following up on blood culture susceptibility and thinks that the blood pressure remains high and the susceptibility indicates daptomycin remain sensitive that we will need to discuss with neurosurgery again regarding debridement for source control -Given patient's Persistent bacteremia they are considering Daptomycin/Ceftaroline Combination -Cardiology been consulted for TEE and this was done and showed no vegetations; Will need to have Discussion with Neurosurgery to re-evaluate the back for I  and D for Source Control    Confusion, in the setting of infection -Neurosurgery is obtaining a MRI of the brain with and without contrast given that he does have an extensive epidural abscess the spine with encephalopathy -MRI Brain showed "Significantly motion degraded examination, as described and limiting evaluation.   Within this limitation, no definite acute intracranial abnormality is identified. Mild generalized parenchymal atrophy and cerebral white matter chronic small vessel ischemic  disease. Mild paranasal sinus disease, as described" -Patient is improving but still having a lot of pain   Unwitnessed Fall -[place] sometime early yesterday morning -Repeat head CT showed no acute intracranial abnormalities noted -Fall Precautions    Normocytic Anemia  - Hgb is 8.6, down from 11.0 on 12/29/20; repeat hemoglobin/hematocrit is now 12.4/38.0 -> 11.4/35.5 -> 10.9/32.3 -> 10.6/32.7 -> 12.9/39.6 -> 11.6/35.3 -> 10.5/31.5 - No overt bleeding  -Checked anemia panel and showed iron level of 19, U IBC 139, TIBC 158, saturation ratios of 12%, ferritin level 864, folate of 2.5, vitamin B12 level 1070 -Continue to monitor for signs and symptoms of bleeding -Repeat CBC in a.m.   Type II DM  - A1c was 9.1% in June 2022  - Check CBGs and use SSI for now ; CBGs ranging from 126-150   CAD status post CABG - No anginal complaints -He had a LIMA to LAD, vein graft to distal circumflex and vein graft RCA in 2017. -His catheterization in March 2020 demonstrated patent vein grafts - Held ASA preoperatively however if he is not going to surgery we will resume, continue atorvastatin 40 mg nightly and beta-blocker with metoprolol tartrate 25 mg nightly; all cardiac medications very resumed -Had Nausea today so checked Troponin given Abnormal EKG and Tropon went from 24 -> 31 -Cardiology was consulted and evaluated and checked a limited echo  Abnormal EKG -Patient denied Chest Pain but had Nausea but no vomiting -Had ST Elevation in leads V1-V2 without Reciprocal changes -Troponin Checked and went from 24 -> 31 -Cardiology Consulted and checked Limited ECHO -ECHO showed now [facility] -Cardiology does not feel he has a STEMI and thinks that his ST elevations are more pronounced in the setting of pain and hypertension   Hyponatremia  - Serum sodium 129 on admission and is improved to 135 but is now back down to 131 -> 132 -> 129 -> 130 -Appears chronic, possibly related to his SNRI   -Checked urine  sodium was 14 and Urine Osm was 484 -Continue monitor and trend sodium level  Thrombocytosis -Likely reactive in the setting of infection -The patient's platelet count was 681 is now trended down to 457 but has trended back up and today is now 791 -> 770 -> 747 -> 671 -Continue monitor and trend and repeat   Elevated anion metabolic acidosis -His anion gap acidosis is now improved and he has a CO2 of 24, chloride level of 96, anion gap of 10 -He was started on sodium bicarbonate drip and this has been discontinued and now also stopped NS  Hypomagnesemia -Mag Level was 1.5 and improved and is now 1.7 again -Replete with IV Mag Sulfate 2 Grams -Continue to Monitor and Replete as Necessary -Repeat Mag Level in the AM   Hypokalemia -Patient's K+ Level is now 3.9 -Continue to Monitor and Replete as Necessary -Repeat CMP in the AM   Oral Thrush -Start nystatin swish and spit  4 Times Daily and also obtain fluconazole 100 mg Daily for 7 doses    GERD -  Continue with Pantoprazole 40 mg p.o. daily  Hyperbilirubinemia -Likely reactive -Patient's T bili and is now 1.5 -> 0.7 and today is 0.6 -> 1.2 -> 1.0 -Continue to Monitor and trend and repeat CMP in the a.m.   Chronic Diastolic CHF  -Appears hypovolemic on admission  -Hold Lasix, started gentle IVF hydration, monitor volume status   -Was given 1.5 L yesterday and started on maintenance IV fluids with sodium bicarb and given his metabolic acidosis; Now will stop IVF with Sodium Bicarbonate and start NS at 75 mL/hr.  Normal saline is to be discontinued today -Strict I's and O's and daily weights: Patient is - 4.108 liters since admission -Continue to Monitor Volume Status Carefully  Normocytic Anemia -Patient's Hgb/Hct 12.4/38.0 -> 11.4/35.5 -> 10.9/32.3 -> 10.6/32.7 -> 12.9/39.6 -> 11.6/35.3 -> 10.5/31.5 -Likley Dilutional Drop -Continue to Monitor for S/Sx of Bleeding; Currently no overt bleeding noted -Repeat CBC in the AM    Anxiety  -Continue Venlafaxine XR 25 mg daily and as-needed Xanax  DVT prophylaxis: SCDs for now and will start Lovenox Code Status: FULL CODE Family Communication: Discussed with wife at bedside  Disposition Plan: Pending further clinical improvement and clearance by specialists  Status is: Inpatient  Remains inpatient appropriate because:Unsafe d/c plan, IV treatments appropriate due to intensity of illness or inability to take PO, and Inpatient level of care appropriate due to severity of illness  Dispo: The patient is from: Home              Anticipated d/c is to: Home              Patient currently is not medically stable to d/c.   Difficult to place patient No  Consultants:  Neurosurgery ID IR Cardiology  Procedures:  ECHOCARDIOGRAM IMPRESSIONS     1. Left ventricular ejection fraction, by estimation, is 55 to 60%. The  left ventricle has normal function. The left ventricle has no regional  wall motion abnormalities. There is moderate left ventricular hypertrophy.  Left ventricular diastolic  parameters are indeterminate.   2. Right ventricular systolic function is normal. The right ventricular  size is normal.   3. The mitral valve is normal in structure. No evidence of mitral valve  regurgitation. No evidence of mitral stenosis.   4. The aortic valve is tricuspid. Aortic valve regurgitation is not  visualized. No aortic stenosis is present.   5. The inferior vena cava is dilated in size with >50% respiratory  variability, suggesting right atrial pressure of 8 mmHg.   FINDINGS   Left Ventricle: Left ventricular ejection fraction, by estimation, is 55  to 60%. The left ventricle has normal function. The left ventricle has no  regional wall motion abnormalities. The left ventricular internal cavity  size was normal in size. There is   moderate left ventricular hypertrophy. Left ventricular diastolic  parameters are indeterminate.   Right Ventricle: The right  ventricular size is normal. Right ventricular  systolic function is normal.   Left Atrium: Left atrial size was normal in size.   Right Atrium: Right atrial size was normal in size.   Pericardium: There is no evidence of pericardial effusion.   Mitral Valve: The mitral valve is normal in structure. No evidence of  mitral valve regurgitation. No evidence of mitral valve stenosis.   Tricuspid Valve: The tricuspid valve is normal in structure. Tricuspid  valve regurgitation is trivial. No evidence of tricuspid stenosis.   Aortic Valve: The aortic valve is tricuspid. Aortic valve regurgitation  is  not visualized. No aortic stenosis is present. Aortic valve mean gradient  measures 2.0 mmHg. Aortic valve peak gradient measures 3.1 mmHg.   Pulmonic Valve: The pulmonic valve was normal in structure. Pulmonic valve  regurgitation is not visualized. No evidence of pulmonic stenosis.   Aorta: The aortic root is normal in size and structure.   Venous: The inferior vena cava is dilated in size with greater than 50%  respiratory variability, suggesting right atrial pressure of 8 mmHg.   IAS/Shunts: No atrial level shunt detected by color flow Doppler.      LEFT VENTRICLE  PLAX 2D  LVIDd:         4.00 cm  LVIDs:         3.40 cm  LV PW:         1.60 cm  LV IVS:        1.50 cm  LVOT diam:     2.50 cm  LV SV:         79  LV SV Index:   36  LVOT Area:     4.91 cm      RIGHT VENTRICLE             IVC  RV Basal diam:  3.30 cm     IVC diam: 2.20 cm  RV S prime:     13.40 cm/s  TAPSE (M-mode): 1.3 cm   LEFT ATRIUM             Index       RIGHT ATRIUM           Index  LA diam:        3.50 cm 1.61 cm/m  RA Area:     14.90 cm  LA Vol (A2C):   69.4 ml 31.91 ml/m RA Volume:   36.10 ml  16.60 ml/m  LA Vol (A4C):   51.3 ml 23.59 ml/m  LA Biplane Vol: 62.6 ml 28.78 ml/m   AORTIC VALVE  AV Area (Vmax):    6.50 cm  AV Area (Vmean):   5.74 cm  AV Area (VTI):     5.53 cm  AV Vmax:            87.60 cm/s  AV Vmean:          59.600 cm/s  AV VTI:            0.142 m  AV Peak Grad:      3.1 mmHg  AV Mean Grad:      2.0 mmHg  LVOT Vmax:         116.00 cm/s  LVOT Vmean:        69.700 cm/s  LVOT VTI:          0.160 m  LVOT/AV VTI ratio: 1.13     AORTA  Ao Root diam: 3.50 cm  Ao Asc diam:  3.60 cm      SHUNTS  Systemic VTI:  0.16 m  Systemic Diam: 2.50 cm   TEE 01/11/21 Normal LV function; trace AI, MR and TR; no vegetations.  Antimicrobials:  Anti-infectives (From admission, onward)    Start     Dose/Rate Route Frequency Ordered Stop   01/10/21 1200  vancomycin (VANCOREADY) IVPB 1250 mg/250 mL        1,250 mg 166.7 mL/hr over 90 Minutes Intravenous Every 8 hours 01/10/21 1037     01/09/21 1900  fluconazole (DIFLUCAN) tablet 100 mg  100 mg Oral Daily 01/09/21 1811 01/16/21 0959   01/07/21 0500  vancomycin (VANCOREADY) IVPB 1500 mg/300 mL  Status:  Discontinued        1,500 mg 150 mL/hr over 120 Minutes Intravenous Every 12 hours 01/06/21 1520 01/10/21 1037   01/06/21 2000  DAPTOmycin (CUBICIN) 750 mg in sodium chloride 0.9 % IVPB  Status:  Discontinued        750 mg 130 mL/hr over 30 Minutes Intravenous Daily 01/06/21 0237 01/06/21 1520   01/06/21 1700  vancomycin (VANCOREADY) IVPB 2000 mg/400 mL        2,000 mg 200 mL/hr over 120 Minutes Intravenous  Once 01/06/21 1520 01/06/21 1848        Subjective: Seen and examined at bedside and going for TEE. Still complaining significant back pain. No Nausea or Vomiting. Denies in any CP. No other concerns or complaints at this time.   Objective: Vitals:   01/11/21 1100 01/11/21 1255 01/11/21 1305 01/11/21 1315  BP: (!) 146/94 118/74 118/74 131/83  Pulse: (!) 106 (!) 102 98 (!) 101  Resp: $Remo'17 16 18 20  'sQXzN$ Temp: 99.3 F (37.4 C) 98 F (36.7 C)    TempSrc: Oral Temporal    SpO2: 97% 97% 97% 97%  Weight:        Intake/Output Summary (Last 24 hours) at 01/11/2021 1350 Last data filed at 01/11/2021 1247 Gross  per 24 hour  Intake 471.41 ml  Output 400 ml  Net 71.41 ml    Filed Weights   01/09/21 1700  Weight: 92.9 kg   Examination: Physical Exam:  Constitutional: Chronically ill-appearing Caucasian male in mild Distress and f comfortable Eyes: Lids and conjunctivae normal, sclerae anicteric  ENMT: External Ears, Nose appear normal. Grossly normal hearing.  Neck: Appears normal, supple, no cervical masses, normal ROM, no appreciable thyromegaly; no JVD Respiratory: Diminished to auscultation bilaterally with coarse breath sounds, no wheezing, rales, rhonchi or crackles. Normal respiratory effort and patient is not tachypenic. No accessory muscle use. Unlabored breathing  Cardiovascular: Tachycardic Rate, no murmurs / rubs / gallops. S1 and S2 auscultated. Has some trace edema Abdomen: Soft, non-tender, Distended 2/2 body habitus. Bowel sounds positive.  GU: Deferred. Musculoskeletal: No clubbing / cyanosis of digits/nails. No joint deformity upper and lower extremities.  Skin: No rashes, lesions, ulcers on a limited skin evaluation. No induration; Warm and dry.  Neurologic: CN 2-12 grossly intact with no focal deficits. Romberg sign and cerebellar reflexes not assessed.  Psychiatric: Normal judgment and insight. Alert and oriented x 3. A little agitated mood and appropriate affect.   Data Reviewed: I have personally reviewed following labs and imaging studies  CBC: Recent Labs  Lab 01/08/21 0303 01/09/21 0105 01/09/21 2141 01/10/21 1001 01/11/21 0226  WBC 27.4* 30.8* 32.1* 31.0* 26.5*  NEUTROABS 22.1* 26.6* 30.8* 27.6* 25.7*  HGB 10.9* 10.6* 12.9* 11.6* 10.5*  HCT 32.3* 32.7* 39.6 35.3* 31.5*  MCV 85.4 87.7 88.8 86.7 86.3  PLT 812* 791* 770* 747* 671*    Basic Metabolic Panel: Recent Labs  Lab 01/08/21 0303 01/09/21 0105 01/09/21 2141 01/10/21 1001 01/11/21 0226  NA 135 131* 132* 129* 130*  K 3.7 3.3* 4.2 3.9 3.7  CL 99 96* 97* 93* 96*  CO2 $Re'26 27 23 25 24  'lqv$ GLUCOSE  180* 195* 150* 142* 130*  BUN 8 <5* 6 5* 6  CREATININE 0.65 0.51* 0.53* 0.58* 0.38*  CALCIUM 8.3* 7.9* 8.2* 8.1* 7.8*  MG 1.8 1.5* 1.9 1.7 1.7  PHOS 3.1 2.7  3.6 2.8 3.1    GFR: Estimated Creatinine Clearance: 118.6 mL/min (A) (by C-G formula based on SCr of 0.38 mg/dL (L)). Liver Function Tests: Recent Labs  Lab 01/08/21 0303 01/09/21 0105 01/09/21 2141 01/10/21 1001 01/11/21 0226  AST 58* 57* 34 23 27  ALT 29 32 $Rem'29 22 20  'toJZ$ ALKPHOS 123 122 132* 124 110  BILITOT 0.7 0.6 1.2 1.0 0.9  PROT 5.6* 5.7* 6.7 6.0* 5.6*  ALBUMIN 1.6* 1.5* 1.7* 1.5* 1.4*    No results for input(s): LIPASE, AMYLASE in the last 168 hours. No results for input(s): AMMONIA in the last 168 hours. Coagulation Profile: No results for input(s): INR, PROTIME in the last 168 hours. Cardiac Enzymes: Recent Labs  Lab 01/06/21 0310  CKTOTAL 32*    BNP (last 3 results) No results for input(s): PROBNP in the last 8760 hours. HbA1C: No results for input(s): HGBA1C in the last 72 hours. CBG: Recent Labs  Lab 01/10/21 1602 01/10/21 1950 01/10/21 2327 01/11/21 0341 01/11/21 0717  GLUCAP 138* 150* 126* 155* 130*    Lipid Profile: No results for input(s): CHOL, HDL, LDLCALC, TRIG, CHOLHDL, LDLDIRECT in the last 72 hours. Thyroid Function Tests: No results for input(s): TSH, T4TOTAL, FREET4, T3FREE, THYROIDAB in the last 72 hours. Anemia Panel: No results for input(s): VITAMINB12, FOLATE, FERRITIN, TIBC, IRON, RETICCTPCT in the last 72 hours.  Sepsis Labs: No results for input(s): PROCALCITON, LATICACIDVEN in the last 168 hours.  Recent Results (from the past 240 hour(s))  Blood culture (routine x 2)     Status: Abnormal (Preliminary result)   Collection Time: 01/05/21  5:20 PM   Specimen: BLOOD  Result Value Ref Range Status   Specimen Description BLOOD SITE NOT SPECIFIED  Final   Special Requests   Final    BOTTLES DRAWN AEROBIC AND ANAEROBIC Blood Culture adequate volume   Culture  Setup Time    Final    GRAM POSITIVE COCCI IN BOTH AEROBIC AND ANAEROBIC BOTTLES CRITICAL VALUE NOTED.  VALUE IS CONSISTENT WITH PREVIOUSLY REPORTED AND CALLED VALUE.    Culture (A)  Final    METHICILLIN RESISTANT STAPHYLOCOCCUS AUREUS Sent to [place] for further susceptibility testing. Performed at [facility], [place] [address] [place], [place] [address]    Report Status PENDING  Incomplete   Organism ID, Bacteria METHICILLIN RESISTANT STAPHYLOCOCCUS AUREUS  Final      Susceptibility   Methicillin resistant staphylococcus aureus - MIC*    CIPROFLOXACIN >=8 RESISTANT Resistant     ERYTHROMYCIN >=8 RESISTANT Resistant     GENTAMICIN <=0.5 SENSITIVE Sensitive     OXACILLIN >=4 RESISTANT Resistant     TETRACYCLINE <=1 SENSITIVE Sensitive     VANCOMYCIN 1 SENSITIVE Sensitive     TRIMETH/SULFA >=320 RESISTANT Resistant     CLINDAMYCIN <=0.25 SENSITIVE Sensitive     RIFAMPIN <=0.5 SENSITIVE Sensitive     Inducible Clindamycin NEGATIVE Sensitive     * METHICILLIN RESISTANT STAPHYLOCOCCUS AUREUS  Blood culture (routine x 2)     Status: Abnormal   Collection Time: 01/05/21  6:10 PM   Specimen: BLOOD  Result Value Ref Range Status   Specimen Description BLOOD SITE NOT SPECIFIED  Final   Special Requests   Final    BOTTLES DRAWN AEROBIC AND ANAEROBIC Blood Culture results may not be optimal due to an inadequate volume of blood received in culture bottles   Culture  Setup Time   Final    GRAM POSITIVE COCCI IN BOTH AEROBIC AND ANAEROBIC  BOTTLES CRITICAL VALUE NOTED.  VALUE IS CONSISTENT WITH PREVIOUSLY REPORTED AND CALLED VALUE.    Culture (A)  Final    STAPHYLOCOCCUS AUREUS SUSCEPTIBILITIES PERFORMED ON PREVIOUS CULTURE WITHIN THE LAST 5 DAYS. Performed at [facility], [place] [address], [place], [place] [address]    Report Status 01/08/2021 FINAL  Final  Resp Panel by RT-PCR (Flu A&B, Covid) Nasopharyngeal Swab     Status: None   Collection Time: 01/05/21  9:32 PM   Specimen:  Nasopharyngeal Swab; Nasopharyngeal(NP) swabs in vial transport medium  Result Value Ref Range Status   SARS Coronavirus 2 by RT PCR NEGATIVE NEGATIVE Final    Comment: (NOTE) SARS-CoV-2 target nucleic acids are NOT DETECTED.  The SARS-CoV-2 RNA is generally detectable in upper respiratory specimens during the acute phase of infection. The lowest concentration of SARS-CoV-2 viral copies this assay can detect is 138 copies/mL. A negative result does not preclude SARS-Cov-2 infection and should not be used as the sole basis for treatment or other patient management decisions. A negative result may occur with  improper specimen collection/handling, submission of specimen other than nasopharyngeal swab, presence of viral mutation(s) within the areas targeted by this assay, and inadequate number of viral copies(<138 copies/mL). A negative result must be combined with clinical observations, patient history, and epidemiological information. The expected result is Negative.  Fact Sheet for Patients:  EntrepreneurPulse.com.au  Fact Sheet for Healthcare Providers:  IncredibleEmployment.be  This test is no t yet approved or cleared by the [place] FDA and  has been authorized for detection and/or diagnosis of SARS-CoV-2 by FDA under an Emergency Use Authorization (EUA). This EUA will remain  in effect (meaning this test can be used) for the duration of the COVID-19 declaration under Section 564(b)(1) of the Act, 21 U.S.C.section 360bbb-3(b)(1), unless the authorization is terminated  or revoked sooner.       Influenza A by PCR NEGATIVE NEGATIVE Final   Influenza B by PCR NEGATIVE NEGATIVE Final    Comment: (NOTE) The Xpert Xpress SARS-CoV-2/FLU/RSV plus assay is intended as an aid in the diagnosis of influenza from Nasopharyngeal swab specimens and should not be used as a sole basis for treatment. Nasal washings and aspirates are unacceptable for  Xpert Xpress SARS-CoV-2/FLU/RSV testing.  Fact Sheet for Patients: EntrepreneurPulse.com.au  Fact Sheet for Healthcare Providers: IncredibleEmployment.be  This test is not yet approved or cleared by the [place] FDA and has been authorized for detection and/or diagnosis of SARS-CoV-2 by FDA under an Emergency Use Authorization (EUA). This EUA will remain in effect (meaning this test can be used) for the duration of the COVID-19 declaration under Section 564(b)(1) of the Act, 21 U.S.C. section 360bbb-3(b)(1), unless the authorization is terminated or revoked.  Performed at [facility], [place] [address], [place], [place] [address]   Culture, blood (routine x 2)     Status: Abnormal   Collection Time: 01/07/21  6:52 AM   Specimen: BLOOD  Result Value Ref Range Status   Specimen Description BLOOD RIGHT ANTECUBITAL  Final   Special Requests   Final    BOTTLES DRAWN AEROBIC AND ANAEROBIC Blood Culture adequate volume   Culture  Setup Time   Final    GRAM POSITIVE COCCI IN CLUSTERS ANAEROBIC BOTTLE ONLY CRITICAL VALUE NOTED.  VALUE IS CONSISTENT WITH PREVIOUSLY REPORTED AND CALLED VALUE.    Culture (A)  Final    STAPHYLOCOCCUS AUREUS SUSCEPTIBILITIES PERFORMED ON PREVIOUS CULTURE WITHIN THE LAST 5 DAYS. Performed at  [facility], [place] [address] [place], [place] [address]    Report Status 01/10/2021 FINAL  Final  Culture, blood (routine x 2)     Status: None (Preliminary result)   Collection Time: 01/07/21  6:52 AM   Specimen: BLOOD RIGHT HAND  Result Value Ref Range Status   Specimen Description BLOOD RIGHT HAND  Final   Special Requests   Final    BOTTLES DRAWN AEROBIC ONLY Blood Culture results may not be optimal due to an inadequate volume of blood received in culture bottles   Culture   Final    NO GROWTH 4 DAYS Performed at [facility], [place] [address] [place], [place] [address]    Report Status PENDING   Incomplete  Aerobic/Anaerobic Culture w Gram Stain (surgical/deep wound)     Status: None (Preliminary result)   Collection Time: 01/08/21 10:10 AM   Specimen: Wound  Result Value Ref Range Status   Specimen Description WOUND  Final   Special Requests NONE  Final   Gram Stain   Final    ABUNDANT WBC PRESENT, PREDOMINANTLY PMN NO ORGANISMS SEEN    Culture   Final    NO GROWTH 3 DAYS NO ANAEROBES ISOLATED; CULTURE IN PROGRESS FOR 5 DAYS Performed at [facility], [place] [address] [place], [place] [address]    Report Status PENDING  Incomplete  Culture, blood (Routine X 2) w Reflex to ID Panel     Status: Abnormal (Preliminary result)   Collection Time: 01/09/21 10:26 AM   Specimen: BLOOD  Result Value Ref Range Status   Specimen Description BLOOD RIGHT ANTECUBITAL  Final   Special Requests   Final    BOTTLES DRAWN AEROBIC AND ANAEROBIC Blood Culture adequate volume   Culture  Setup Time   Final    GRAM POSITIVE COCCI IN CLUSTERS ANAEROBIC BOTTLE ONLY CRITICAL VALUE NOTED.  VALUE IS CONSISTENT WITH PREVIOUSLY REPORTED AND CALLED VALUE.    Culture (A)  Final    STAPHYLOCOCCUS AUREUS SUSCEPTIBILITIES PERFORMED ON PREVIOUS CULTURE WITHIN THE LAST 5 DAYS. Performed at [facility], [place] [address], [place], [place] [address]    Report Status PENDING  Incomplete  Culture, blood (Routine X 2) w Reflex to ID Panel     Status: Abnormal (Preliminary result)   Collection Time: 01/09/21 10:49 AM   Specimen: BLOOD LEFT HAND  Result Value Ref Range Status   Specimen Description BLOOD LEFT HAND  Final   Special Requests   Final    BOTTLES DRAWN AEROBIC AND ANAEROBIC Blood Culture results may not be optimal due to an inadequate volume of blood received in culture bottles   Culture  Setup Time   Final    GRAM POSITIVE COCCI IN CLUSTERS IN BOTH AEROBIC AND ANAEROBIC BOTTLES CRITICAL VALUE NOTED.  VALUE IS CONSISTENT WITH PREVIOUSLY REPORTED AND CALLED VALUE.    Culture (A)  Final     STAPHYLOCOCCUS AUREUS SUSCEPTIBILITIES PERFORMED ON PREVIOUS CULTURE WITHIN THE LAST 5 DAYS. Performed at [facility], [place] [address] [place], [place] [address]    Report Status PENDING  Incomplete     RN Pressure Injury Documentation:     Estimated body mass index is 28.56 kg/m as calculated from the following:   Height as of 12/21/20: $RemoveBef'5\' 11"'SoXnjpqNCn$  (1.803 m).   Weight as of this encounter: 92.9 kg.  Malnutrition Type:   Malnutrition Characteristics:   Nutrition Interventions:    Radiology Studies: ECHO TEE  Result Date: 01/11/2021    TRANSESOPHOGEAL ECHO REPORT   Patient Name:   Louis [name]  Date of Exam: 01/11/2021 Medical Rec #:  [id number]  Height:       71.0 in Accession #:    [id number] Weight:       204.8 lb Date of Birth:  August 16, 1965   BSA:          2.130 m Patient Age:    35 years   BP:           151/95 mmHg Patient Gender: M          HR:           90 bpm. Exam Location:  Inpatient Procedure: Transesophageal Echo and Color Doppler Indications:     Bacteremia  History:         Patient has prior history of Echocardiogram examinations, most                  recent 01/09/2021. CAD; Risk Factors:Diabetes, Dyslipidemia and                  Hypertension.  Sonographer:     Bernadene Person RDCS Referring Phys:  [id number] Darreld Mclean Diagnosing Phys: Kirk Ruths MD PROCEDURE: After discussion of the risks and benefits of a TEE, an informed consent was obtained from the patient. The transesophogeal probe was passed without difficulty through the esophogus of the patient. Sedation performed by different physician. The patient was monitored while under deep sedation. Anesthestetic sedation was provided intravenously by Anesthesiology: 143.8mg  of Propofol, 40mg  of Lidocaine. The patient's vital signs; including heart rate, blood pressure, and oxygen saturation; remained stable throughout the procedure. The patient developed no complications during the procedure. IMPRESSIONS  1. No  vegetations.  2. Left ventricular ejection fraction, by estimation, is 55 to 60%. The left ventricle has normal function.  3. Right ventricular systolic function is normal. The right ventricular size is normal.  4. No left atrial/left atrial appendage thrombus was detected.  5. The mitral valve is normal in structure. Trivial mitral valve regurgitation.  6. The aortic valve is tricuspid. Aortic valve regurgitation is trivial. FINDINGS  Left Ventricle: Left ventricular ejection fraction, by estimation, is 55 to 60%. The left ventricle has normal function. The left ventricular internal cavity size was normal in size. Right Ventricle: The right ventricular size is normal.Right ventricular systolic function is normal. Left Atrium: Left atrial size was normal in size. No left atrial/left atrial appendage thrombus was detected. Right Atrium: Right atrial size was normal in size. Pericardium: There is no evidence of pericardial effusion. Mitral Valve: The mitral valve is normal in structure. Trivial mitral valve regurgitation. Tricuspid Valve: The tricuspid valve is normal in structure. Tricuspid valve regurgitation is trivial. Aortic Valve: The aortic valve is tricuspid. Aortic valve regurgitation is trivial. Pulmonic Valve: The pulmonic valve was normal in structure. Pulmonic valve regurgitation is trivial. Aorta: The aortic root is normal in size and structure. There is minimal (Grade I) plaque involving the descending aorta. IAS/Shunts: No atrial level shunt detected by color flow Doppler. Additional Comments: No vegetations. Kirk Ruths MD Electronically signed by Kirk Ruths MD Signature Date/Time: 01/11/2021/1:36:08 PM    Final     Scheduled Meds:  aspirin EC  81 mg Oral Daily   atorvastatin  40 mg Oral QHS   enoxaparin (LOVENOX) injection  40 mg Subcutaneous QHS   fluconazole  100 mg Oral Daily   gabapentin  200 mg Oral TID  insulin aspart  0-9 Units Subcutaneous Q4H   lactulose  10 g Oral Daily    losartan  25 mg Oral Daily   metoprolol succinate  25 mg Oral Daily   nystatin  5 mL Oral QID   oxyCODONE  20 mg Oral Q12H   pantoprazole  40 mg Oral Daily   polyethylene glycol  17 g Oral BID   venlafaxine XR  75 mg Oral q morning   Continuous Infusions:  sodium chloride     sodium chloride     vancomycin 1,250 mg (01/11/21 0436)    LOS: 5 days   Kerney Elbe, DO [facility] PAGER is on AMION  If 7PM-7AM, please contact night-coverage [email or web address]

## 2021-01-11 NOTE — Transfer of Care (Signed)
Immediate Anesthesia Transfer of Care Note  Patient: Louis [name]  Procedure(s) Performed: TRANSESOPHAGEAL ECHOCARDIOGRAM (TEE)  Patient Location: Endoscopy Unit  Anesthesia Type:MAC  Level of Consciousness: awake and drowsy  Airway & Oxygen Therapy: Patient Spontanous Breathing and Patient connected to nasal cannula oxygen  Post-op Assessment: Report given to RN and Post -op Vital signs reviewed and stable  Post vital signs: Reviewed and stable  Last Vitals:  Vitals Value Taken Time  BP    Temp    Pulse    Resp    SpO2      Last Pain:  Vitals:   01/11/21 1100  TempSrc: Oral  PainSc: 7       Patients Stated Pain Goal: 2 (97/91/50 4136)  Complications: No notable events documented.

## 2021-01-11 NOTE — Progress Notes (Signed)
    Transesophageal Echocardiogram Note  Ousman Dise [id number] [birth year]  Procedure: Transesophageal Echocardiogram Indications: Bacteremia   Procedure Details Consent: Obtained Time Out: Verified patient identification, verified procedure, site/side was marked, verified correct patient position, special equipment/implants available, Radiology Safety Procedures followed,  medications/allergies/relevent history reviewed, required imaging and test results available.  Performed  Medications:  Pt sedated by anesthesia with lidocaine 40 mg and diprovan 144 mg IV.  Normal LV function; trace AI, MR and TR; no vegetations.   Complications: No apparent complications Patient did tolerate procedure well.  Olga Millers, MD

## 2021-01-12 ENCOUNTER — Inpatient Hospital Stay (HOSPITAL_COMMUNITY): Payer: Federal, State, Local not specified - PPO

## 2021-01-12 DIAGNOSIS — B9562 Methicillin resistant Staphylococcus aureus infection as the cause of diseases classified elsewhere: Secondary | ICD-10-CM | POA: Diagnosis not present

## 2021-01-12 DIAGNOSIS — G062 Extradural and subdural abscess, unspecified: Secondary | ICD-10-CM | POA: Diagnosis not present

## 2021-01-12 DIAGNOSIS — R7881 Bacteremia: Secondary | ICD-10-CM | POA: Diagnosis not present

## 2021-01-12 LAB — CBC WITH DIFFERENTIAL/PLATELET
Abs Immature Granulocytes: 0.18 10*3/uL — ABNORMAL HIGH (ref 0.00–0.07)
Basophils Absolute: 0 10*3/uL (ref 0.0–0.1)
Basophils Relative: 0 %
Eosinophils Absolute: 0.1 10*3/uL (ref 0.0–0.5)
Eosinophils Relative: 0 %
HCT: 31.6 % — ABNORMAL LOW (ref 39.0–52.0)
Hemoglobin: 10.7 g/dL — ABNORMAL LOW (ref 13.0–17.0)
Immature Granulocytes: 1 %
Lymphocytes Relative: 9 %
Lymphs Abs: 1.9 10*3/uL (ref 0.7–4.0)
MCH: 29.2 pg (ref 26.0–34.0)
MCHC: 33.9 g/dL (ref 30.0–36.0)
MCV: 86.1 fL (ref 80.0–100.0)
Monocytes Absolute: 1.3 10*3/uL — ABNORMAL HIGH (ref 0.1–1.0)
Monocytes Relative: 6 %
Neutro Abs: 18 10*3/uL — ABNORMAL HIGH (ref 1.7–7.7)
Neutrophils Relative %: 84 %
Platelets: 549 10*3/uL — ABNORMAL HIGH (ref 150–400)
RBC: 3.67 MIL/uL — ABNORMAL LOW (ref 4.22–5.81)
RDW: 15.5 % (ref 11.5–15.5)
WBC: 21.5 10*3/uL — ABNORMAL HIGH (ref 4.0–10.5)
nRBC: 0 % (ref 0.0–0.2)

## 2021-01-12 LAB — COMPREHENSIVE METABOLIC PANEL
ALT: 18 U/L (ref 0–44)
AST: 23 U/L (ref 15–41)
Albumin: 1.3 g/dL — ABNORMAL LOW (ref 3.5–5.0)
Alkaline Phosphatase: 116 U/L (ref 38–126)
Anion gap: 6 (ref 5–15)
BUN: 6 mg/dL (ref 6–20)
CO2: 27 mmol/L (ref 22–32)
Calcium: 7.7 mg/dL — ABNORMAL LOW (ref 8.9–10.3)
Chloride: 99 mmol/L (ref 98–111)
Creatinine, Ser: 0.49 mg/dL — ABNORMAL LOW (ref 0.61–1.24)
GFR, Estimated: >60 mL/min (ref >60)
Glucose, Bld: 160 mg/dL — ABNORMAL HIGH (ref 70–99)
Potassium: 3.6 mmol/L (ref 3.5–5.1)
Sodium: 132 mmol/L — ABNORMAL LOW (ref 135–145)
Total Bilirubin: 0.9 mg/dL (ref 0.3–1.2)
Total Protein: 5.2 g/dL — ABNORMAL LOW (ref 6.5–8.1)

## 2021-01-12 LAB — MISC LABCORP TEST (SEND OUT): Labcorp test code: 88005

## 2021-01-12 LAB — CULTURE, BLOOD (ROUTINE X 2)
Culture: NO GROWTH
Special Requests: ADEQUATE

## 2021-01-12 LAB — GLUCOSE, CAPILLARY
Glucose-Capillary: 167 mg/dL — ABNORMAL HIGH (ref 70–99)
Glucose-Capillary: 146 mg/dL — ABNORMAL HIGH (ref 70–99)
Glucose-Capillary: 193 mg/dL — ABNORMAL HIGH (ref 70–99)
Glucose-Capillary: 182 mg/dL — ABNORMAL HIGH (ref 70–99)
Glucose-Capillary: 167 mg/dL — ABNORMAL HIGH (ref 70–99)
Glucose-Capillary: 133 mg/dL — ABNORMAL HIGH (ref 70–99)

## 2021-01-12 LAB — PHOSPHORUS: Phosphorus: 3.6 mg/dL (ref 2.5–4.6)

## 2021-01-12 LAB — MAGNESIUM: Magnesium: 1.7 mg/dL (ref 1.7–2.4)

## 2021-01-12 MED ORDER — IOHEXOL 300 MG/ML  SOLN
100.0000 mL | Freq: Once | INTRAMUSCULAR | Status: AC | PRN
  Administered 2021-01-12: 100 mL via INTRAVENOUS

## 2021-01-12 MED ORDER — LABETALOL HCL 5 MG/ML IV SOLN: 10.0000 mg | INTRAVENOUS | Status: DC | PRN

## 2021-01-12 MED ORDER — SODIUM CHLORIDE 0.9 % IV SOLN
9.0000 mg/kg | Freq: Every day | INTRAVENOUS | Status: DC
  Administered 2021-01-12: 850 mg via INTRAVENOUS
  Filled 2021-01-12 (×2): qty 17

## 2021-01-12 MED ORDER — CYCLOBENZAPRINE HCL 10 MG PO TABS
10.0000 mg | ORAL_TABLET | Freq: Three times a day (TID) | ORAL | Status: DC | PRN
  Administered 2021-01-12 – 2021-01-18 (×8): 10 mg via ORAL
  Filled 2021-01-12 (×8): qty 1

## 2021-01-12 MED ORDER — SODIUM CHLORIDE 0.9 % IV SOLN
600.0000 mg | Freq: Three times a day (TID) | INTRAVENOUS | Status: AC
  Administered 2021-01-12 – 2021-01-27 (×46): 600 mg via INTRAVENOUS
  Filled 2021-01-12 (×47): qty 20

## 2021-01-12 MED ORDER — ACETAMINOPHEN 325 MG PO TABS
650.0000 mg | ORAL_TABLET | ORAL | Status: DC | PRN
  Administered 2021-01-13 – 2021-01-28 (×14): 650 mg via ORAL
  Filled 2021-01-12 (×14): qty 2

## 2021-01-12 NOTE — Progress Notes (Signed)
Progress Note    Louis [name]   ZOX:[id number]  DOB: Nov 13, 1965  DOA: 01/05/2021     6  PCP: Bobette Mo, NP  CC: back pain  Hospital Course: Louis [name] is a 55 year old Caucasian male with PMH significant for but not limited to DMII, HTN, CAD s/p CABG 2017, anxiety, chronic pain as well as other comorbidities with recent admission with an epidural abscess, vertebral osteomyelitis, psoas abscess and MRSA bacteremia who returned to the ED with worsening back pain.    He reported that his back pain has been stable until approximately 5 days PTA when it became more severe and progressively worsened since.  He did not notice any new leg numbness or weakness, but did have some urinary incontinence.  Back pain was reportedly severe and constant; worse with movement and radiating to both legs.   He denied any fevers or chills but stated he did have some mild drainage from his surgical wounds.   He was admitted on 12/12/2020 after found to have an epidural abscess and vertebral osteomyelitis. He underwent a laminectomy and evacuation of abscess on 12/13/2020 and then drainage of the psoas abscess by IR on 12/22/2020.  Blood cultures from 12/13/2020 grew out MRSA but there is no vegetation seen on TTE and blood cultures were drawn and repeated 12/15/2020 remain negative.   He has been on daptomycin at home. On admission, WBC 23.9, PLTC 681k.  COVID PCR was negative.   He had an MRI of the thoracic and lumbar spine which revealed multiple fluid collections involving the ventral epidural abscess at the L4-L5 level with progressive discitis and osteomyelitis at the L4-L5 and L5-S1 levels.  Blood cultures were repeated in the ED.  Initial plan was for lumbar decompression however Dr. Jake Samples of neurosurgery evaluated and felt that his thoracic mass-effect was improved and that even though there was persistent ostial discitis and epidural abscess throughout the thoracolumbar spine there is possibly a more loculated  focus of the abscess at L4-L5 with stenosis but no focal weakness, groin numbness or numbness and tingling in his legs he did not believe that urgent or emergent decompression was needed at that time.   Patient has been confused more often per his wife and neurosurgery is obtaining MRI of the brain with and without contrast given concern of some cerebral spread of infection.  Because he has failed multiple medical treatments will likely need broaden antibiotic therapy and will consult ID and neurosurgery feels that the stenosis L4-L5 could be decompressed however neurologically patient is doing well with no focal deficits and therefore they are recommending aggressive medical and supportive care.   ID changed to IV Vancomycin and consulted IR for drainage of the deep soft tissue collection with peripheral enhancement measuring 4.7 x 5.0 x 1.7 cm.  Dr. Jake Samples of neurosurgery recommending continuing vancomycin and aggressive medical care.   Patient had some nausea and an abnormal EKG so cardiology was consulted for further evaluation and stat troponins were ordered and they were negative.  Cardiology evaluated and felt that we should repeat a limited echo.  Limited echo done and showed no wall motion abnormalities.  There is no evidence of STEMI.   The patient is to undergo a TEE on 01/11/2021.  ID recommending following blood culture susceptibility.  Per ID if WBC remains high and the susceptibility indicates daptomycin remain sensitive they are recommending that we need to discuss with neurosurgery given regarding debridement for source control.  Interval History:  No events  overnight.  Wife present bedside this morning.  Reviewed plan for repeating CT abdomen/pelvis today.  Overall just feels fatigued with ongoing back pain.  ROS: Constitutional: negative for chills and fevers, Respiratory: negative for cough and sputum, Cardiovascular: negative for chest pain, and Gastrointestinal: negative for  abdominal pain  Assessment & Plan: Sepsis secondary to epidural abscess Discitis/OM MRSA bacteremia -tachycardic, tachypneic, had a leukocytosis; epidural abscess - s/p laminectomy & evacuation of abscess 12/21/20 - treated with Dapto; developed worsening back pain; MRI L- and T-spine to have multiple fluid collections including ventral epidural abscess and progressive osteomyelitis at L 4-5 - negative TTE and TEE - persistently positive blood cultures, MRSA - repeat blood cx on 7/20 -Neurosurgery also following.  Case rediscussed on 01/12/2021 -Has been treated with vancomycin.  Transitioning to daptomycin plus ceftaroline on 01/12/2021 per ID -Repeat CT abdomen/pelvis being performed on 01/12/2021  Acute metabolic encephalopathy - patient symptoms include intermittent confusion - etiology considered due to metabolic and infectious etiologies - Mentation has been slowly improving - MRI brain also performed; motion degraded.  Mild generalized atrophy, chronic small vessel disease -Noted that patient was also in significant pain on admission which may have also contributed as well as treatment with pain medications  Fall -Despite severe weakness, patient fell out of bed on 01/10/2021 -Repeat head CT showed no acute intracranial abnormalities noted -Fall Precautions -Waist belt for safety   Normocytic Anemia  -Likely multifactorial in setting of ongoing infection, multiple phlebotomy draws - No overt bleeding  -Checked anemia panel and showed iron level of 19, U IBC 139, TIBC 158, saturation ratios of 12%, ferritin level 864, folate of 2.5, vitamin B12 level 1070 -Continue to monitor for signs and symptoms of bleeding -Continue trending CBC   Type II DM  - A1c was 9.1% in June 2022  - Check CBGs and use SSI for now   CAD status post CABG - No anginal complaints -He had a LIMA to LAD, vein graft to distal circumflex and vein graft RCA in 2017. -His catheterization in March 2020  demonstrated patent vein grafts -Continue aspirin, statin, BB   Abnormal EKG -Patient denied Chest Pain but had Nausea but no vomiting -Had ST Elevation in leads V1-V2 without Reciprocal changes -Troponin Checked and went from 24 -> 31 -Cardiology Consulted and checked Limited ECHO -ECHO showed no WMA -Cardiology does not feel he has a STEMI and thinks that his ST elevations are more pronounced in the setting of pain and hypertension   Hyponatremia  -Appears chronic, possibly related to his SNRI but may also be hypovolemia from infection/fever/insensible losses -Checked urine sodium was 14 and Urine Osm was 484 -BMP daily   Thrombocytosis -Likely reactive in the setting of infection -continue daily CBC    Elevated anion metabolic acidosis - resolved  - s/p treatment    Hypomagnesemia -Replete and recheck as needed   Hypokalemia -Replete and recheck as needed   Oral Thrush -Start nystatin swish and spit  4 Times Daily and also obtain fluconazole 100 mg Daily for 7 doses   GERD -Continue with Pantoprazole 40 mg p.o. daily  Cholestasis of sepsis -Likely reactive and due to infection/cholestasis    Chronic Diastolic CHF  -Appeared hypovolemic on admission  -Hold Lasix   Anxiety  -Continue Venlafaxine XR 25 mg daily and as needed Xanax   Old records reviewed in assessment of this patient  Antimicrobials: Vancomycin 01/06/2021 >> 01/12/2021 Fluconazole 01/09/2021 >> current Ceftaroline >> current Daptomycin >> current  DVT prophylaxis: enoxaparin (LOVENOX) injection 40 mg Start: 01/09/21 1500 SCDs Start: 01/06/21 0224   Code Status:   Code Status: Full Code Family Communication: Wife  Disposition Plan: Status is: Inpatient  Remains inpatient appropriate because:Ongoing active pain requiring inpatient pain management, Ongoing diagnostic testing needed not appropriate for outpatient work up, IV treatments appropriate due to intensity of illness or inability to  take PO, and Inpatient level of care appropriate due to severity of illness  Dispo: The patient is from: Home              Anticipated d/c is to: Home              Patient currently is not medically stable to d/c.   Difficult to place patient No      Risk of unplanned readmission score: Unplanned Admission- Pilot do not use: 30   Objective: Blood pressure 105/79, pulse 97, temperature 97.6 F (36.4 C), temperature source Axillary, resp. rate 13, weight 92.9 kg, SpO2 96 %.  Examination: General appearance: alert, cooperative, fatigued, and no distress Head: Normocephalic, without obvious abnormality, atraumatic Eyes:  EOMI Lungs: clear to auscultation bilaterally Heart: regular rate and rhythm and S1, S2 normal Abdomen: normal findings: bowel sounds normal and soft, non-tender Extremities:  No edema Skin: mobility and turgor normal Neurologic: Weak throughout (worse in LE) but no focal deficits appreciated  Consultants:  ID Neurosurgery  Cardiology IR  Procedures:  T7-8, T12-L1, L3-L4 laminectomy for Epidural Abscess, 6/28 Left psoas muscle abscess drainage, 6/30 Paraspinal abscess drainage, 7/16  Data Reviewed: I have personally reviewed following labs and imaging studies Results for orders placed or performed during the hospital encounter of 01/05/21 (from the past 24 hour(s))  Culture, blood (routine x 2)     Status: None (Preliminary result)   Collection Time: 01/11/21  2:02 PM   Specimen: BLOOD RIGHT HAND  Result Value Ref Range   Specimen Description BLOOD RIGHT HAND    Special Requests      BOTTLES DRAWN AEROBIC AND ANAEROBIC Blood Culture adequate volume   Culture  Setup Time      GRAM POSITIVE COCCI IN CLUSTERS ANAEROBIC BOTTLE ONLY CRITICAL RESULT CALLED TO, READ BACK BY AND VERIFIED WITH: J. LEDFORD PHARMD, AT 0630 01/12/21 D. VANHOOK    Culture      NO GROWTH < 24 HOURS Performed at [facility], [address], [place], [place] [address]     Report Status PENDING   Culture, blood (routine x 2)     Status: None (Preliminary result)   Collection Time: 01/11/21  2:03 PM   Specimen: BLOOD LEFT HAND  Result Value Ref Range   Specimen Description BLOOD LEFT HAND    Special Requests      BOTTLES DRAWN AEROBIC AND ANAEROBIC Blood Culture adequate volume   Culture  Setup Time      GRAM POSITIVE COCCI IN CLUSTERS ANAEROBIC BOTTLE ONLY CRITICAL VALUE NOTED.  VALUE IS CONSISTENT WITH PREVIOUSLY REPORTED AND CALLED VALUE. Performed at [facility], [address], [place], [place] [address]    Culture GRAM POSITIVE COCCI    Report Status PENDING   Glucose, capillary     Status: Abnormal   Collection Time: 01/11/21  3:48 PM  Result Value Ref Range   Glucose-Capillary 191 (H) 70 - 99 mg/dL  Glucose, capillary     Status: Abnormal   Collection Time: 01/11/21  8:15 PM  Result Value Ref Range   Glucose-Capillary 164 (  H) 70 - 99 mg/dL  Glucose, capillary     Status: Abnormal   Collection Time: 01/11/21 11:30 PM  Result Value Ref Range   Glucose-Capillary 175 (H) 70 - 99 mg/dL  CBC with Differential/Platelet     Status: Abnormal   Collection Time: 01/12/21  3:30 AM  Result Value Ref Range   WBC 21.5 (H) 4.0 - 10.5 K/uL   RBC 3.67 (L) 4.22 - 5.81 MIL/uL   Hemoglobin 10.7 (L) 13.0 - 17.0 g/dL   HCT 16.1 (L) 09.6 - 04.5 %   MCV 86.1 80.0 - 100.0 fL   MCH 29.2 26.0 - 34.0 pg   MCHC 33.9 30.0 - 36.0 g/dL   RDW 40.9 81.1 - 91.4 %   Platelets 549 (H) 150 - 400 K/uL   nRBC 0.0 0.0 - 0.2 %   Neutrophils Relative % 84 %   Neutro Abs 18.0 (H) 1.7 - 7.7 K/uL   Lymphocytes Relative 9 %   Lymphs Abs 1.9 0.7 - 4.0 K/uL   Monocytes Relative 6 %   Monocytes Absolute 1.3 (H) 0.1 - 1.0 K/uL   Eosinophils Relative 0 %   Eosinophils Absolute 0.1 0.0 - 0.5 K/uL   Basophils Relative 0 %   Basophils Absolute 0.0 0.0 - 0.1 K/uL   Immature Granulocytes 1 %   Abs Immature Granulocytes 0.18 (H) 0.00 - 0.07 K/uL  Comprehensive metabolic panel      Status: Abnormal   Collection Time: 01/12/21  3:30 AM  Result Value Ref Range   Sodium 132 (L) 135 - 145 mmol/L   Potassium 3.6 3.5 - 5.1 mmol/L   Chloride 99 98 - 111 mmol/L   CO2 27 22 - 32 mmol/L   Glucose, Bld 160 (H) 70 - 99 mg/dL   BUN 6 6 - 20 mg/dL   Creatinine, Ser 7.82 (L) 0.61 - 1.24 mg/dL   Calcium 7.7 (L) 8.9 - 10.3 mg/dL   Total Protein 5.2 (L) 6.5 - 8.1 g/dL   Albumin 1.3 (L) 3.5 - 5.0 g/dL   AST 23 15 - 41 U/L   ALT 18 0 - 44 U/L   Alkaline Phosphatase 116 38 - 126 U/L   Total Bilirubin 0.9 0.3 - 1.2 mg/dL   GFR, Estimated >95 >62 mL/min   Anion gap 6 5 - 15  Magnesium     Status: None   Collection Time: 01/12/21  3:30 AM  Result Value Ref Range   Magnesium 1.7 1.7 - 2.4 mg/dL  Phosphorus     Status: None   Collection Time: 01/12/21  3:30 AM  Result Value Ref Range   Phosphorus 3.6 2.5 - 4.6 mg/dL  Glucose, capillary     Status: Abnormal   Collection Time: 01/12/21  4:06 AM  Result Value Ref Range   Glucose-Capillary 167 (H) 70 - 99 mg/dL  Glucose, capillary     Status: Abnormal   Collection Time: 01/12/21  7:34 AM  Result Value Ref Range   Glucose-Capillary 146 (H) 70 - 99 mg/dL  Glucose, capillary     Status: Abnormal   Collection Time: 01/12/21 11:14 AM  Result Value Ref Range   Glucose-Capillary 193 (H) 70 - 99 mg/dL    Recent Results (from the past 240 hour(s))  Blood culture (routine x 2)     Status: Abnormal (Preliminary result)   Collection Time: 01/05/21  5:20 PM   Specimen: BLOOD  Result Value Ref Range Status   Specimen Description BLOOD SITE NOT SPECIFIED  Final   Special Requests   Final    BOTTLES DRAWN AEROBIC AND ANAEROBIC Blood Culture adequate volume   Culture  Setup Time   Final    GRAM POSITIVE COCCI IN BOTH AEROBIC AND ANAEROBIC BOTTLES CRITICAL VALUE NOTED.  VALUE IS CONSISTENT WITH PREVIOUSLY REPORTED AND CALLED VALUE.    Culture (A)  Final    METHICILLIN RESISTANT STAPHYLOCOCCUS AUREUS Sent to [facility] for further  susceptibility testing. Performed at [facility], [address], [place], [place] [address]    Report Status PENDING  Incomplete   Organism ID, Bacteria METHICILLIN RESISTANT STAPHYLOCOCCUS AUREUS  Final      Susceptibility   Methicillin resistant staphylococcus aureus - MIC*    CIPROFLOXACIN >=8 RESISTANT Resistant     ERYTHROMYCIN >=8 RESISTANT Resistant     GENTAMICIN <=0.5 SENSITIVE Sensitive     OXACILLIN >=4 RESISTANT Resistant     TETRACYCLINE <=1 SENSITIVE Sensitive     VANCOMYCIN 1 SENSITIVE Sensitive     TRIMETH/SULFA >=320 RESISTANT Resistant     CLINDAMYCIN <=0.25 SENSITIVE Sensitive     RIFAMPIN <=0.5 SENSITIVE Sensitive     Inducible Clindamycin NEGATIVE Sensitive     * METHICILLIN RESISTANT STAPHYLOCOCCUS AUREUS  Blood culture (routine x 2)     Status: Abnormal   Collection Time: 01/05/21  6:10 PM   Specimen: BLOOD  Result Value Ref Range Status   Specimen Description BLOOD SITE NOT SPECIFIED  Final   Special Requests   Final    BOTTLES DRAWN AEROBIC AND ANAEROBIC Blood Culture results may not be optimal due to an inadequate volume of blood received in culture bottles   Culture  Setup Time   Final    GRAM POSITIVE COCCI IN BOTH AEROBIC AND ANAEROBIC BOTTLES CRITICAL VALUE NOTED.  VALUE IS CONSISTENT WITH PREVIOUSLY REPORTED AND CALLED VALUE.    Culture (A)  Final    STAPHYLOCOCCUS AUREUS SUSCEPTIBILITIES PERFORMED ON PREVIOUS CULTURE WITHIN THE LAST 5 DAYS. Performed at [facility], [address] [place], [place] [address]    Report Status 01/08/2021 FINAL  Final  Resp Panel by RT-PCR (Flu A&B, Covid) Nasopharyngeal Swab     Status: None   Collection Time: 01/05/21  9:32 PM   Specimen: Nasopharyngeal Swab; Nasopharyngeal(NP) swabs in vial transport medium  Result Value Ref Range Status   SARS Coronavirus 2 by RT PCR NEGATIVE NEGATIVE Final    Comment: (NOTE) SARS-CoV-2 target nucleic acids are NOT DETECTED.  The SARS-CoV-2 RNA is generally  detectable in upper respiratory specimens during the acute phase of infection. The lowest concentration of SARS-CoV-2 viral copies this assay can detect is 138 copies/mL. A negative result does not preclude SARS-Cov-2 infection and should not be used as the sole basis for treatment or other patient management decisions. A negative result may occur with  improper specimen collection/handling, submission of specimen other than nasopharyngeal swab, presence of viral mutation(s) within the areas targeted by this assay, and inadequate number of viral copies(<138 copies/mL). A negative result must be combined with clinical observations, patient history, and epidemiological information. The expected result is Negative.  Fact Sheet for Patients:  BloggerCourse.com  Fact Sheet for Healthcare Providers:  SeriousBroker.it  This test is no t yet approved or cleared by the [place] FDA and  has been authorized for detection and/or diagnosis of SARS-CoV-2 by FDA under an Emergency Use Authorization (EUA). This EUA will remain  in effect (meaning this test can be used) for the duration  of the COVID-19 declaration under Section 564(b)(1) of the Act, 21 U.S.C.section 360bbb-3(b)(1), unless the authorization is terminated  or revoked sooner.       Influenza A by PCR NEGATIVE NEGATIVE Final   Influenza B by PCR NEGATIVE NEGATIVE Final    Comment: (NOTE) The Xpert Xpress SARS-CoV-2/FLU/RSV plus assay is intended as an aid in the diagnosis of influenza from Nasopharyngeal swab specimens and should not be used as a sole basis for treatment. Nasal washings and aspirates are unacceptable for Xpert Xpress SARS-CoV-2/FLU/RSV testing.  Fact Sheet for Patients: BloggerCourse.com  Fact Sheet for Healthcare Providers: SeriousBroker.it  This test is not yet approved or cleared by the [place] FDA  and has been authorized for detection and/or diagnosis of SARS-CoV-2 by FDA under an Emergency Use Authorization (EUA). This EUA will remain in effect (meaning this test can be used) for the duration of the COVID-19 declaration under Section 564(b)(1) of the Act, 21 U.S.C. section 360bbb-3(b)(1), unless the authorization is terminated or revoked.  Performed at [facility], [address] [place], [place] [address]   Culture, blood (routine x 2)     Status: Abnormal   Collection Time: 01/07/21  6:52 AM   Specimen: BLOOD  Result Value Ref Range Status   Specimen Description BLOOD RIGHT ANTECUBITAL  Final   Special Requests   Final    BOTTLES DRAWN AEROBIC AND ANAEROBIC Blood Culture adequate volume   Culture  Setup Time   Final    GRAM POSITIVE COCCI IN CLUSTERS ANAEROBIC BOTTLE ONLY CRITICAL VALUE NOTED.  VALUE IS CONSISTENT WITH PREVIOUSLY REPORTED AND CALLED VALUE.    Culture (A)  Final    STAPHYLOCOCCUS AUREUS SUSCEPTIBILITIES PERFORMED ON PREVIOUS CULTURE WITHIN THE LAST 5 DAYS. Performed at [facility], [address] [place], [place] [address]    Report Status 01/10/2021 FINAL  Final  Culture, blood (routine x 2)     Status: None   Collection Time: 01/07/21  6:52 AM   Specimen: BLOOD RIGHT HAND  Result Value Ref Range Status   Specimen Description BLOOD RIGHT HAND  Final   Special Requests   Final    BOTTLES DRAWN AEROBIC ONLY Blood Culture results may not be optimal due to an inadequate volume of blood received in culture bottles   Culture   Final    NO GROWTH 5 DAYS Performed at [facility], [address] [place], [place] [address]    Report Status 01/12/2021 FINAL  Final  Aerobic/Anaerobic Culture w Gram Stain (surgical/deep wound)     Status: None (Preliminary result)   Collection Time: 01/08/21 10:10 AM   Specimen: Wound  Result Value Ref Range Status   Specimen Description WOUND  Final   Special Requests NONE  Final   Gram Stain    Final    ABUNDANT WBC PRESENT, PREDOMINANTLY PMN NO ORGANISMS SEEN    Culture   Final    NO GROWTH 4 DAYS NO ANAEROBES ISOLATED; CULTURE IN PROGRESS FOR 5 DAYS Performed at [facility], [address], [place], [place] [address]    Report Status PENDING  Incomplete  Culture, blood (Routine X 2) w Reflex to ID Panel     Status: Abnormal   Collection Time: 01/09/21 10:26 AM   Specimen: BLOOD  Result Value Ref Range Status   Specimen Description BLOOD RIGHT ANTECUBITAL  Final   Special Requests   Final    BOTTLES DRAWN AEROBIC AND ANAEROBIC Blood  Culture adequate volume   Culture  Setup Time   Final    GRAM POSITIVE COCCI IN CLUSTERS ANAEROBIC BOTTLE ONLY CRITICAL VALUE NOTED.  VALUE IS CONSISTENT WITH PREVIOUSLY REPORTED AND CALLED VALUE.    Culture (A)  Final    STAPHYLOCOCCUS AUREUS SUSCEPTIBILITIES PERFORMED ON PREVIOUS CULTURE WITHIN THE LAST 5 DAYS. Performed at [facility], [address], [place], [place] [address]    Report Status 01/12/2021 FINAL  Final  Culture, blood (Routine X 2) w Reflex to ID Panel     Status: Abnormal   Collection Time: 01/09/21 10:49 AM   Specimen: BLOOD LEFT HAND  Result Value Ref Range Status   Specimen Description BLOOD LEFT HAND  Final   Special Requests   Final    BOTTLES DRAWN AEROBIC AND ANAEROBIC Blood Culture results may not be optimal due to an inadequate volume of blood received in culture bottles   Culture  Setup Time   Final    GRAM POSITIVE COCCI IN CLUSTERS IN BOTH AEROBIC AND ANAEROBIC BOTTLES CRITICAL VALUE NOTED.  VALUE IS CONSISTENT WITH PREVIOUSLY REPORTED AND CALLED VALUE.    Culture (A)  Final    STAPHYLOCOCCUS AUREUS SUSCEPTIBILITIES PERFORMED ON PREVIOUS CULTURE WITHIN THE LAST 5 DAYS. Performed at [facility], [address], [place], [place] [address]    Report Status 01/12/2021 FINAL  Final  Culture, blood (routine x 2)     Status: None (Preliminary result)   Collection Time: 01/11/21  2:02 PM    Specimen: BLOOD RIGHT HAND  Result Value Ref Range Status   Specimen Description BLOOD RIGHT HAND  Final   Special Requests   Final    BOTTLES DRAWN AEROBIC AND ANAEROBIC Blood Culture adequate volume   Culture  Setup Time   Final    GRAM POSITIVE COCCI IN CLUSTERS ANAEROBIC BOTTLE ONLY CRITICAL RESULT CALLED TO, READ BACK BY AND VERIFIED WITH: J. LEDFORD PHARMD, AT 0630 01/12/21 D. VANHOOK    Culture   Final    NO GROWTH < 24 HOURS Performed at [facility], [address] [place], [place] [address]    Report Status PENDING  Incomplete  Culture, blood (routine x 2)     Status: None (Preliminary result)   Collection Time: 01/11/21  2:03 PM   Specimen: BLOOD LEFT HAND  Result Value Ref Range Status   Specimen Description BLOOD LEFT HAND  Final   Special Requests   Final    BOTTLES DRAWN AEROBIC AND ANAEROBIC Blood Culture adequate volume   Culture  Setup Time   Final    GRAM POSITIVE COCCI IN CLUSTERS ANAEROBIC BOTTLE ONLY CRITICAL VALUE NOTED.  VALUE IS CONSISTENT WITH PREVIOUSLY REPORTED AND CALLED VALUE. Performed at [facility], [address], [place], [place] [address]    Culture [facility] POSITIVE COCCI  Final   Report Status PENDING  Incomplete     Radiology Studies: ECHO TEE  Result Date: 01/11/2021    TRANSESOPHOGEAL ECHO REPORT   Patient Name:   Louis [name]  Date of Exam: 01/11/2021 Medical Rec #:  [id number]  Height:       71.0 in Accession #:    [id number] Weight:       204.8 lb Date of Birth:  November 26, 1965   BSA:          2.130 m Patient Age:    55 years   BP:           151/95  mmHg Patient Gender: M          HR:           90 bpm. Exam Location:  Inpatient Procedure: Transesophageal Echo and Color Doppler Indications:     Bacteremia  History:         Patient has prior history of Echocardiogram examinations, most                  recent 01/09/2021. CAD; Risk Factors:Diabetes, Dyslipidemia and                  Hypertension.  Sonographer:     Eulah Pont RDCS  Referring Phys:  [id number] Corrin Parker Diagnosing Phys: Olga Millers MD PROCEDURE: After discussion of the risks and benefits of a TEE, an informed consent was obtained from the patient. The transesophogeal probe was passed without difficulty through the esophogus of the patient. Sedation performed by different physician. The patient was monitored while under deep sedation. Anesthestetic sedation was provided intravenously by Anesthesiology: 143.8mg  of Propofol, 40mg  of Lidocaine. The patient's vital signs; including heart rate, blood pressure, and oxygen saturation; remained stable throughout the procedure. The patient developed no complications during the procedure. IMPRESSIONS  1. No vegetations.  2. Left ventricular ejection fraction, by estimation, is 55 to 60%. The left ventricle has normal function.  3. Right ventricular systolic function is normal. The right ventricular size is normal.  4. No left atrial/left atrial appendage thrombus was detected.  5. The mitral valve is normal in structure. Trivial mitral valve regurgitation.  6. The aortic valve is tricuspid. Aortic valve regurgitation is trivial. FINDINGS  Left Ventricle: Left ventricular ejection fraction, by estimation, is 55 to 60%. The left ventricle has normal function. The left ventricular internal cavity size was normal in size. Right Ventricle: The right ventricular size is normal.Right ventricular systolic function is normal. Left Atrium: Left atrial size was normal in size. No left atrial/left atrial appendage thrombus was detected. Right Atrium: Right atrial size was normal in size. Pericardium: There is no evidence of pericardial effusion. Mitral Valve: The mitral valve is normal in structure. Trivial mitral valve regurgitation. Tricuspid Valve: The tricuspid valve is normal in structure. Tricuspid valve regurgitation is trivial. Aortic Valve: The aortic valve is tricuspid. Aortic valve regurgitation is trivial. Pulmonic Valve: The  pulmonic valve was normal in structure. Pulmonic valve regurgitation is trivial. Aorta: The aortic root is normal in size and structure. There is minimal (Grade I) plaque involving the descending aorta. IAS/Shunts: No atrial level shunt detected by color flow Doppler. Additional Comments: No vegetations. MD Electronically signed by MD Signature Date/Time: 01/11/2021/1:36:08 PM    Final    CT PELVIS W CONTRAST  Final Result    CT HEAD WO CONTRAST  Final Result    MR BRAIN W WO CONTRAST  Final Result    MR Lumbar Spine W Wo Contrast  Final Result    MR THORACIC SPINE W WO CONTRAST  Final Result    01/13/2021 FNA SOFT TISSUE    (Results Pending)  CT ABDOMEN PELVIS W CONTRAST    (Results Pending)    Scheduled Meds:  aspirin EC  81 mg Oral Daily   atorvastatin  40 mg Oral QHS   enoxaparin (LOVENOX) injection  40 mg Subcutaneous QHS   fluconazole  100 mg Oral Daily   gabapentin  200 mg Oral TID   insulin aspart  0-9 Units Subcutaneous Q4H   lactulose  10  g Oral Daily   losartan  25 mg Oral Daily   metoprolol succinate  25 mg Oral Daily   nystatin  5 mL Oral QID   oxyCODONE  20 mg Oral Q12H   pantoprazole  40 mg Oral Daily   polyethylene glycol  17 g Oral BID   venlafaxine XR  75 mg Oral q morning   PRN Meds: acetaminophen **OR** acetaminophen, ALPRAZolam, alum & mag hydroxide-simeth, diclofenac Sodium, HYDROmorphone (DILAUDID) injection, labetalol, lidocaine, ondansetron **OR** ondansetron (ZOFRAN) IV Continuous Infusions:  ceFTAROline (TEFLARO) IV 600 mg (01/12/21 1329)   DAPTOmycin (CUBICIN)  IV       LOS: 6 days  Time spent: Greater than 50% of the 35 minute visit was spent in counseling/coordination of care for the patient as laid out in the A&P.   Lewie Chamberavid Dineen Conradt, MD [facility] 01/12/2021, 1:57 PM

## 2021-01-12 NOTE — Progress Notes (Signed)
Chart check note   Repeat bcx from 7/19 is growing gpc in clusters   Vanc is therapeutic   This is a source control issue likely from the spinal epidural abscess   Tee was negative     -discussed with dr Frederick Peers. Would get NSG involved again regarding potential i&d of spinal abscess -repeat blood culture -repeat abd pelv ct with contrast -switch abx to dapto plus ceftarolin e

## 2021-01-12 NOTE — Hospital Course (Signed)
Louis [name] is a 55 year old Caucasian male with PMH significant for but not limited to DMII, HTN, CAD s/p CABG 2017, anxiety, chronic pain as well as other comorbidities with recent admission with an epidural abscess, vertebral osteomyelitis, psoas abscess and MRSA bacteremia who returned to the ED with worsening back pain.    He reported that his back pain has been stable until approximately 5 days PTA when it became more severe and progressively worsened since.  He did not notice any new leg numbness or weakness, but did have some urinary incontinence.  Back pain was reportedly severe and constant; worse with movement and radiating to both legs.   He denied any fevers or chills but stated he did have some mild drainage from his surgical wounds.   He was admitted on 12/12/2020 after found to have an epidural abscess and vertebral osteomyelitis. He underwent a laminectomy and evacuation of abscess on 12/13/2020 and then drainage of the psoas abscess by IR on 12/22/2020.  Blood cultures from 12/13/2020 grew out MRSA but there is no vegetation seen on TTE and blood cultures were drawn and repeated 12/15/2020 remain negative.   He has been on daptomycin at home. On admission, WBC 23.9, PLTC 681k.  COVID PCR was negative.   He had an MRI of the thoracic and lumbar spine which revealed multiple fluid collections involving the ventral epidural abscess at the L4-L5 level with progressive discitis and osteomyelitis at the L4-L5 and L5-S1 levels.  Blood cultures were repeated in the ED.  Initial plan was for lumbar decompression however Dr. Jake Samples of neurosurgery evaluated and felt that his thoracic mass-effect was improved and that even though there was persistent ostial discitis and epidural abscess throughout the thoracolumbar spine there is possibly a more loculated focus of the abscess at L4-L5 with stenosis but no focal weakness, groin numbness or numbness and tingling in his legs he did not believe that urgent or  emergent decompression was needed at that time.   Patient has been confused more often per his wife and neurosurgery is obtaining MRI of the brain with and without contrast given concern of some cerebral spread of infection.  Because he has failed multiple medical treatments will likely need broaden antibiotic therapy and will consult ID and neurosurgery feels that the stenosis L4-L5 could be decompressed however neurologically patient is doing well with no focal deficits and therefore they are recommending aggressive medical and supportive care.   ID changed to IV Vancomycin and consulted IR for drainage of the deep soft tissue collection with peripheral enhancement measuring 4.7 x 5.0 x 1.7 cm.  Dr. Jake Samples of neurosurgery recommending continuing vancomycin and aggressive medical care.   Patient had some nausea and an abnormal EKG so cardiology was consulted for further evaluation and stat troponins were ordered and they were negative.  Cardiology evaluated and felt that we should repeat a limited echo.  Limited echo done and showed no wall motion abnormalities.  There is no evidence of STEMI.   The patient is to undergo a TEE on 01/11/2021.  ID recommending following blood culture susceptibility.  Per ID if WBC remains high and the susceptibility indicates daptomycin remain sensitive they are recommending that we need to discuss with neurosurgery given regarding debridement for source control.

## 2021-01-12 NOTE — Progress Notes (Signed)
[facility]  Date of Admission:  01/05/2021     CC: Mrsa bacteremia  Lines: Peripheral iv's  Abx: 7/14-c vanc  Previously daptomycin  ASSESSMENT: 55 yo male recent mrsa bacteremia/epidural abscess/OM and psoas abscess s/p open debridement 6/28 and IR drainage of left psoas discharged on daptomycin, now readmitted 7/13 for worsening back pain of a few days found to have recurrent mrsa bacteremia  7/13 & 7/15 & 7/17 bcx mrsa  7/19 bcx ngtd  7/13 mri lumbar/thoracic showed several areas of fluid collection with a more loculated focus at L4-5. There is no focal LE neurologic deficit or urinary/bowel incontinence. NSG reviewed case and felt no urgent decompression at this time  7/16 IR reviewed case and percutaneously drained the 5 cm L3 level subdural fluid collection.  7/17 tte no vegetation  Patient remains with significant leukocytosis. His fever so far had resolved.   Concern for drug resistance vs source control, and with recurrent mrsa bsi endocarditis  --------------- 7/20 assessment Back pain improved but periods of severity Remains afebrile Wbc improving 7/19 bcx repeat in progress Tee from 7/19 no valve vegetation Clinically patient without new focal pain/tenderness despite persistent bacteremia 7/13-7/17  Vanc dose adjusted by id Pharmacy   Pending daptomycin susceptibility testing from 7/13 still  PLAN: F/u 7/13 bcx susceptibility Continue vanc (id team adjusting dose) If daptomycin susceptibility is sensitive from 7/13 or new fever/persistent bacteremia, will need NSG reevaluate for back abscess debridement/source control Discussed with primary team   I spent more than 35 minute reviewing data/chart, and coordinating care and >50% direct face to face time providing counseling/discussing diagnostics/treatment plan with patient      Principal Problem:   MRSA bacteremia Active Problems:   CAD (coronary artery  disease)   HTN (hypertension)   Uncontrolled diabetes mellitus (HCC)   Epidural abscess   Normocytic anemia   Hyponatremia   Anxiety   Chronic diastolic CHF (congestive heart failure) (HCC)   No Known Allergies  Scheduled Meds:  aspirin EC  81 mg Oral Daily   atorvastatin  40 mg Oral QHS   enoxaparin (LOVENOX) injection  40 mg Subcutaneous QHS   fluconazole  100 mg Oral Daily   gabapentin  200 mg Oral TID   insulin aspart  0-9 Units Subcutaneous Q4H   lactulose  10 g Oral Daily   losartan  25 mg Oral Daily   metoprolol succinate  25 mg Oral Daily   nystatin  5 mL Oral QID   oxyCODONE  20 mg Oral Q12H   pantoprazole  40 mg Oral Daily   polyethylene glycol  17 g Oral BID   venlafaxine XR  75 mg Oral q morning   Continuous Infusions:  sodium chloride     sodium chloride     vancomycin 1,250 mg (01/12/21 0414)   PRN Meds:.acetaminophen **OR** acetaminophen, ALPRAZolam, alum & mag hydroxide-simeth, diclofenac Sodium, HYDROmorphone (DILAUDID) injection, labetalol, lidocaine, ondansetron **OR** ondansetron (ZOFRAN) IV   SUBJECTIVE: No fever/chill/n/v/diarrhea/rash Back pain at times severe but managed Pendign susceptibility 7/19 bcx ngtd  Tolerating vanc Wbc down trending  Review of Systems: ROS All other ROS was negative, except mentioned above     OBJECTIVE: Vitals:   01/11/21 1619 01/11/21 2325 01/12/21 0404 01/12/21 0726  BP: 124/84 113/76 113/75 130/76  Pulse:  (!) 106 94 97  Resp:  (!) 26 15 19   Temp: 98.5 F (36.9 C) 98.3 F (36.8 C) 97.9 F (36.6  C) 98 F (36.7 C)  TempSrc: Oral Oral Axillary Oral  SpO2: 100% 93% 93% 94%  Weight:       Body mass index is 28.56 kg/m.  Physical Exam General/constitutional: no distress, pleasant HEENT: Normocephalic, PER, Conj Clear, EOMI, Oropharynx clear Neck supple CV: rrr no mrg Lungs: clear to auscultation, normal respiratory effort Abd: Soft, Nontender Ext: no edema Skin: No Rash Neuro: nonfocal MSK:  lower back spine tenderness   Lab Results Lab Results  Component Value Date   WBC 21.5 (H) 01/12/2021   HGB 10.7 (L) 01/12/2021   HCT 31.6 (L) 01/12/2021   MCV 86.1 01/12/2021   PLT 549 (H) 01/12/2021    Lab Results  Component Value Date   CREATININE 0.49 (L) 01/12/2021   BUN 6 01/12/2021   NA 132 (L) 01/12/2021   K 3.6 01/12/2021   CL 99 01/12/2021   CO2 27 01/12/2021    Lab Results  Component Value Date   ALT 18 01/12/2021   AST 23 01/12/2021   ALKPHOS 116 01/12/2021   BILITOT 0.9 01/12/2021      Microbiology: Recent Results (from the past 240 hour(s))  Blood culture (routine x 2)     Status: Abnormal (Preliminary result)   Collection Time: 01/05/21  5:20 PM   Specimen: BLOOD  Result Value Ref Range Status   Specimen Description BLOOD SITE NOT SPECIFIED  Final   Special Requests   Final    BOTTLES DRAWN AEROBIC AND ANAEROBIC Blood Culture adequate volume   Culture  Setup Time   Final    GRAM POSITIVE COCCI IN BOTH AEROBIC AND ANAEROBIC BOTTLES CRITICAL VALUE NOTED.  VALUE IS CONSISTENT WITH PREVIOUSLY REPORTED AND CALLED VALUE.    Culture (A)  Final    METHICILLIN RESISTANT STAPHYLOCOCCUS AUREUS Sent to [facility] for further susceptibility testing. Performed at [facility], [address], [place], [place] [address]    Report Status PENDING  Incomplete   Organism ID, Bacteria METHICILLIN RESISTANT STAPHYLOCOCCUS AUREUS  Final      Susceptibility   Methicillin resistant staphylococcus aureus - MIC*    CIPROFLOXACIN >=8 RESISTANT Resistant     ERYTHROMYCIN >=8 RESISTANT Resistant     GENTAMICIN <=0.5 SENSITIVE Sensitive     OXACILLIN >=4 RESISTANT Resistant     TETRACYCLINE <=1 SENSITIVE Sensitive     VANCOMYCIN 1 SENSITIVE Sensitive     TRIMETH/SULFA >=320 RESISTANT Resistant     CLINDAMYCIN <=0.25 SENSITIVE Sensitive     RIFAMPIN <=0.5 SENSITIVE Sensitive     Inducible Clindamycin NEGATIVE Sensitive     * METHICILLIN RESISTANT STAPHYLOCOCCUS  AUREUS  Blood culture (routine x 2)     Status: Abnormal   Collection Time: 01/05/21  6:10 PM   Specimen: BLOOD  Result Value Ref Range Status   Specimen Description BLOOD SITE NOT SPECIFIED  Final   Special Requests   Final    BOTTLES DRAWN AEROBIC AND ANAEROBIC Blood Culture results may not be optimal due to an inadequate volume of blood received in culture bottles   Culture  Setup Time   Final    GRAM POSITIVE COCCI IN BOTH AEROBIC AND ANAEROBIC BOTTLES CRITICAL VALUE NOTED.  VALUE IS CONSISTENT WITH PREVIOUSLY REPORTED AND CALLED VALUE.    Culture (A)  Final    STAPHYLOCOCCUS AUREUS SUSCEPTIBILITIES PERFORMED ON PREVIOUS CULTURE WITHIN THE LAST 5 DAYS. Performed at [facility], [address], [place], [place] [address]    Report Status 01/08/2021 FINAL  Final  Resp Panel by RT-PCR (Flu A&B, Covid) Nasopharyngeal Swab     Status: None   Collection Time: 01/05/21  9:32 PM   Specimen: Nasopharyngeal Swab; Nasopharyngeal(NP) swabs in vial transport medium  Result Value Ref Range Status   SARS Coronavirus 2 by RT PCR NEGATIVE NEGATIVE Final    Comment: (NOTE) SARS-CoV-2 target nucleic acids are NOT DETECTED.  The SARS-CoV-2 RNA is generally detectable in upper respiratory specimens during the acute phase of infection. The lowest concentration of SARS-CoV-2 viral copies this assay can detect is 138 copies/mL. A negative result does not preclude SARS-Cov-2 infection and should not be used as the sole basis for treatment or other patient management decisions. A negative result may occur with  improper specimen collection/handling, submission of specimen other than nasopharyngeal swab, presence of viral mutation(s) within the areas targeted by this assay, and inadequate number of viral copies(<138 copies/mL). A negative result must be combined with clinical observations, patient history, and epidemiological information. The expected result is Negative.  Fact Sheet for  Patients:  BloggerCourse.com  Fact Sheet for Healthcare Providers:  SeriousBroker.it  This test is no t yet approved or cleared by the [place] FDA and  has been authorized for detection and/or diagnosis of SARS-CoV-2 by FDA under an Emergency Use Authorization (EUA). This EUA will remain  in effect (meaning this test can be used) for the duration of the COVID-19 declaration under Section 564(b)(1) of the Act, 21 U.S.C.section 360bbb-3(b)(1), unless the authorization is terminated  or revoked sooner.       Influenza A by PCR NEGATIVE NEGATIVE Final   Influenza B by PCR NEGATIVE NEGATIVE Final    Comment: (NOTE) The Xpert Xpress SARS-CoV-2/FLU/RSV plus assay is intended as an aid in the diagnosis of influenza from Nasopharyngeal swab specimens and should not be used as a sole basis for treatment. Nasal washings and aspirates are unacceptable for Xpert Xpress SARS-CoV-2/FLU/RSV testing.  Fact Sheet for Patients: BloggerCourse.com  Fact Sheet for Healthcare Providers: SeriousBroker.it  This test is not yet approved or cleared by the [place] FDA and has been authorized for detection and/or diagnosis of SARS-CoV-2 by FDA under an Emergency Use Authorization (EUA). This EUA will remain in effect (meaning this test can be used) for the duration of the COVID-19 declaration under Section 564(b)(1) of the Act, 21 U.S.C. section 360bbb-3(b)(1), unless the authorization is terminated or revoked.  Performed at [facility], [address], [place], [place] [address]   Culture, blood (routine x 2)     Status: Abnormal   Collection Time: 01/07/21  6:52 AM   Specimen: BLOOD  Result Value Ref Range Status   Specimen Description BLOOD RIGHT ANTECUBITAL  Final   Special Requests   Final    BOTTLES DRAWN AEROBIC AND ANAEROBIC Blood Culture adequate volume   Culture  Setup  Time   Final    GRAM POSITIVE COCCI IN CLUSTERS ANAEROBIC BOTTLE ONLY CRITICAL VALUE NOTED.  VALUE IS CONSISTENT WITH PREVIOUSLY REPORTED AND CALLED VALUE.    Culture (A)  Final    STAPHYLOCOCCUS AUREUS SUSCEPTIBILITIES PERFORMED ON PREVIOUS CULTURE WITHIN THE LAST 5 DAYS. Performed at [facility], [address] [place], [place] [address]    Report Status 01/10/2021 FINAL  Final  Culture, blood (routine x 2)     Status: None   Collection Time: 01/07/21  6:52 AM   Specimen: BLOOD RIGHT HAND  Result Value Ref Range Status   Specimen Description BLOOD RIGHT HAND  Final   Special Requests   Final    BOTTLES DRAWN AEROBIC ONLY Blood Culture results may not be optimal due to an inadequate volume of blood received in culture bottles   Culture   Final    NO GROWTH 5 DAYS Performed at [facility], [address], [place], [place] [address]    Report Status 01/12/2021 FINAL  Final  Aerobic/Anaerobic Culture w Gram Stain (surgical/deep wound)     Status: None (Preliminary result)   Collection Time: 01/08/21 10:10 AM   Specimen: Wound  Result Value Ref Range Status   Specimen Description WOUND  Final   Special Requests NONE  Final   Gram Stain   Final    ABUNDANT WBC PRESENT, PREDOMINANTLY PMN NO ORGANISMS SEEN    Culture   Final    NO GROWTH 3 DAYS NO ANAEROBES ISOLATED; CULTURE IN PROGRESS FOR 5 DAYS Performed at [facility], [address], [place], [place] [address]    Report Status PENDING  Incomplete  Culture, blood (Routine X 2) w Reflex to ID Panel     Status: Abnormal   Collection Time: 01/09/21 10:26 AM   Specimen: BLOOD  Result Value Ref Range Status   Specimen Description BLOOD RIGHT ANTECUBITAL  Final   Special Requests   Final    BOTTLES DRAWN AEROBIC AND ANAEROBIC Blood Culture adequate volume   Culture  Setup Time   Final    GRAM POSITIVE COCCI IN CLUSTERS ANAEROBIC BOTTLE ONLY CRITICAL VALUE NOTED.  VALUE IS CONSISTENT WITH PREVIOUSLY  REPORTED AND CALLED VALUE.    Culture (A)  Final    STAPHYLOCOCCUS AUREUS SUSCEPTIBILITIES PERFORMED ON PREVIOUS CULTURE WITHIN THE LAST 5 DAYS. Performed at [facility], [address] [place] [address], [place], [place] [address]    Report Status 01/12/2021 FINAL  Final  Culture, blood (Routine X 2) w Reflex to ID Panel     Status: Abnormal   Collection Time: 01/09/21 10:49 AM   Specimen: BLOOD LEFT HAND  Result Value Ref Range Status   Specimen Description BLOOD LEFT HAND  Final   Special Requests   Final    BOTTLES DRAWN AEROBIC AND ANAEROBIC Blood Culture results may not be optimal due to an inadequate volume of blood received in culture bottles   Culture  Setup Time   Final    GRAM POSITIVE COCCI IN CLUSTERS IN BOTH AEROBIC AND ANAEROBIC BOTTLES CRITICAL VALUE NOTED.  VALUE IS CONSISTENT WITH PREVIOUSLY REPORTED AND CALLED VALUE.    Culture (A)  Final    STAPHYLOCOCCUS AUREUS SUSCEPTIBILITIES PERFORMED ON PREVIOUS CULTURE WITHIN THE LAST 5 DAYS. Performed at [facility], [address] [place], [place] [address]    Report Status 01/12/2021 FINAL  Final  Culture, blood (routine x 2)     Status: None (Preliminary result)   Collection Time: 01/11/21  2:02 PM   Specimen: BLOOD RIGHT HAND  Result Value Ref Range Status   Specimen Description BLOOD RIGHT HAND  Final   Special Requests   Final    BOTTLES DRAWN AEROBIC AND ANAEROBIC Blood Culture adequate volume   Culture  Setup Time   Final    GRAM POSITIVE COCCI IN CLUSTERS ANAEROBIC BOTTLE ONLY CRITICAL RESULT CALLED TO, READ BACK BY AND VERIFIED WITH: J. LEDFORD PHARMD, AT 0630 01/12/21 D. VANHOOK    Culture   Final    NO GROWTH < 24 HOURS Performed at [facility], [address]  [place], [place] [address]    Report Status PENDING  Incomplete  Culture, blood (routine x 2)     Status: None (Preliminary result)   Collection Time: 01/11/21  2:03 PM   Specimen: BLOOD LEFT HAND  Result Value Ref Range Status   Specimen  Description BLOOD LEFT HAND  Final   Special Requests   Final    BOTTLES DRAWN AEROBIC AND ANAEROBIC Blood Culture adequate volume   Culture  Setup Time   Final    GRAM POSITIVE COCCI IN CLUSTERS ANAEROBIC BOTTLE ONLY CRITICAL VALUE NOTED.  VALUE IS CONSISTENT WITH PREVIOUSLY REPORTED AND CALLED VALUE. Performed at [facility], [address] [place], [place] [address]    Culture [facility] POSITIVE COCCI  Final   Report Status PENDING  Incomplete     Serology:   Imaging: If present, new imagings (plain films, ct scans, and mri) have been personally visualized and interpreted; radiology reports have been reviewed. Decision making incorporated into the Impression / Recommendations.  7/13 mri thoracic lumbar Paraspinal and other soft tissues: Patient is status post left laminectomy at L3. Fluid tracks from the laminectomy site into a subcutaneous and deep soft tissue collection with peripheral enhancement measuring 4.7 x 5.0 x 1.7 cm. The ventral epidural collection extends inferiorly to the L2 level. Separate prominent ventral collection is present posterior to L4 prominently right the left. This collection measures 2.9 x 1.3 cm on the sagittal images.   Disc levels:   In addition to the epidural collections, disc disease is again noted at L4-5 and L5-S1. Central foraminal narrowing are associated.   IMPRESSION: 1. T7 laminectomy subcutaneous peripherally enhancing fluid collection at this level as well. 2. Residual posterior epidural fluid collection in the thoracic spine with similar cephalo caudad extension from T5-6-T9 but less mass effect. 3. Progressive signal abnormality and enhancement throughout the T7 and T8 vertebral bodies in the T7-8 thoracic disc consistent with disc osteomyelitis. 4. Progressive paraspinous soft tissue enhancement at T3-4 through T9-10 without drainable collection. 5. Persistent extensive ventral epidural collection compatible  with abscess. 6. Status post left laminectomy at L3 with peripherally enhancing fluid collection extending from the operative site into the subcutaneous and deep tissues as described. This is concerning abscess. 7. Fluid tracks from the laminectomy site into a subdural deep soft tissue collection measuring 4.7 x 5.0 x 1.7 cm. 8. Separate ventral epidural collection at L4-5 and L5-S1 consistent with epidural abscess. 9. Progressive signal abnormality and enhancement in the L3 and L4 vertebral bodies. 10. 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   7/17 tte  1. Left ventricular ejection fraction, by estimation, is 55 to 60%. The  left ventricle has normal function. The left ventricle has no regional  wall motion abnormalities. There is mild concentric left ventricular  hypertrophy.   2. Right ventricular systolic function is normal. The right ventricular  size is normal.   3. The mitral valve is grossly normal. Trivial mitral valve  regurgitation. No evidence of mitral stenosis.   4. The aortic valve is grossly normal. Aortic valve regurgitation is not  visualized. No aortic stenosis is present.   5. The inferior vena cava is normal in size with greater than 50%  respiratory variability, suggesting right atrial pressure of 3 mmHg.   7/19 tee No valvular vegetation   Raymondo Band, MD [facility] [phone] pager    01/12/2021, 10:44 AM

## 2021-01-13 ENCOUNTER — Inpatient Hospital Stay: Payer: Federal, State, Local not specified - PPO | Admitting: Internal Medicine

## 2021-01-13 DIAGNOSIS — B370 Candidal stomatitis: Secondary | ICD-10-CM

## 2021-01-13 DIAGNOSIS — E43 Unspecified severe protein-calorie malnutrition: Secondary | ICD-10-CM | POA: Insufficient documentation

## 2021-01-13 DIAGNOSIS — R9431 Abnormal electrocardiogram [ECG] [EKG]: Secondary | ICD-10-CM

## 2021-01-13 DIAGNOSIS — D75839 Thrombocytosis, unspecified: Secondary | ICD-10-CM

## 2021-01-13 DIAGNOSIS — B9562 Methicillin resistant Staphylococcus aureus infection as the cause of diseases classified elsewhere: Secondary | ICD-10-CM | POA: Diagnosis not present

## 2021-01-13 DIAGNOSIS — R7881 Bacteremia: Secondary | ICD-10-CM | POA: Diagnosis not present

## 2021-01-13 DIAGNOSIS — G062 Extradural and subdural abscess, unspecified: Secondary | ICD-10-CM | POA: Diagnosis not present

## 2021-01-13 DIAGNOSIS — K831 Obstruction of bile duct: Secondary | ICD-10-CM

## 2021-01-13 DIAGNOSIS — E876 Hypokalemia: Secondary | ICD-10-CM

## 2021-01-13 DIAGNOSIS — W19XXXA Unspecified fall, initial encounter: Secondary | ICD-10-CM

## 2021-01-13 DIAGNOSIS — G9341 Metabolic encephalopathy: Secondary | ICD-10-CM

## 2021-01-13 DIAGNOSIS — M4620 Osteomyelitis of vertebra, site unspecified: Secondary | ICD-10-CM | POA: Diagnosis not present

## 2021-01-13 DIAGNOSIS — K219 Gastro-esophageal reflux disease without esophagitis: Secondary | ICD-10-CM

## 2021-01-13 DIAGNOSIS — E872 Acidosis: Secondary | ICD-10-CM

## 2021-01-13 DIAGNOSIS — A419 Sepsis, unspecified organism: Secondary | ICD-10-CM

## 2021-01-13 DIAGNOSIS — E8729 Other acidosis: Secondary | ICD-10-CM

## 2021-01-13 DIAGNOSIS — M4640 Discitis, unspecified, site unspecified: Secondary | ICD-10-CM

## 2021-01-13 LAB — CBC WITH DIFFERENTIAL/PLATELET
Abs Immature Granulocytes: 0.21 10*3/uL — ABNORMAL HIGH (ref 0.00–0.07)
Basophils Absolute: 0 10*3/uL (ref 0.0–0.1)
Basophils Relative: 0 %
Eosinophils Absolute: 0 10*3/uL (ref 0.0–0.5)
Eosinophils Relative: 0 %
HCT: 35.5 % — ABNORMAL LOW (ref 39.0–52.0)
Hemoglobin: 11.6 g/dL — ABNORMAL LOW (ref 13.0–17.0)
Immature Granulocytes: 1 %
Lymphocytes Relative: 7 %
Lymphs Abs: 1.5 10*3/uL (ref 0.7–4.0)
MCH: 28.4 pg (ref 26.0–34.0)
MCHC: 32.7 g/dL (ref 30.0–36.0)
MCV: 86.8 fL (ref 80.0–100.0)
Monocytes Absolute: 1.3 10*3/uL — ABNORMAL HIGH (ref 0.1–1.0)
Monocytes Relative: 6 %
Neutro Abs: 18.2 10*3/uL — ABNORMAL HIGH (ref 1.7–7.7)
Neutrophils Relative %: 86 %
Platelets: 626 10*3/uL — ABNORMAL HIGH (ref 150–400)
RBC: 4.09 MIL/uL — ABNORMAL LOW (ref 4.22–5.81)
RDW: 15.6 % — ABNORMAL HIGH (ref 11.5–15.5)
WBC: 21.3 10*3/uL — ABNORMAL HIGH (ref 4.0–10.5)
nRBC: 0 % (ref 0.0–0.2)

## 2021-01-13 LAB — BASIC METABOLIC PANEL
Anion gap: 12 (ref 5–15)
BUN: 5 mg/dL — ABNORMAL LOW (ref 6–20)
CO2: 25 mmol/L (ref 22–32)
Calcium: 8 mg/dL — ABNORMAL LOW (ref 8.9–10.3)
Chloride: 94 mmol/L — ABNORMAL LOW (ref 98–111)
Creatinine, Ser: 0.42 mg/dL — ABNORMAL LOW (ref 0.61–1.24)
GFR, Estimated: >60 mL/min (ref >60)
Glucose, Bld: 143 mg/dL — ABNORMAL HIGH (ref 70–99)
Potassium: 3.9 mmol/L (ref 3.5–5.1)
Sodium: 131 mmol/L — ABNORMAL LOW (ref 135–145)

## 2021-01-13 LAB — GLUCOSE, CAPILLARY
Glucose-Capillary: 142 mg/dL — ABNORMAL HIGH (ref 70–99)
Glucose-Capillary: 150 mg/dL — ABNORMAL HIGH (ref 70–99)
Glucose-Capillary: 153 mg/dL — ABNORMAL HIGH (ref 70–99)
Glucose-Capillary: 169 mg/dL — ABNORMAL HIGH (ref 70–99)
Glucose-Capillary: 225 mg/dL — ABNORMAL HIGH (ref 70–99)
Glucose-Capillary: 276 mg/dL — ABNORMAL HIGH (ref 70–99)

## 2021-01-13 LAB — CULTURE, BLOOD (ROUTINE X 2): Special Requests: ADEQUATE

## 2021-01-13 LAB — AEROBIC/ANAEROBIC CULTURE W GRAM STAIN (SURGICAL/DEEP WOUND): Culture: NO GROWTH

## 2021-01-13 LAB — PREALBUMIN: Prealbumin: 5.9 mg/dL — ABNORMAL LOW (ref 18–38)

## 2021-01-13 LAB — MAGNESIUM: Magnesium: 1.7 mg/dL (ref 1.7–2.4)

## 2021-01-13 MED ORDER — VANCOMYCIN HCL 1250 MG/250ML IV SOLN
1250.0000 mg | Freq: Three times a day (TID) | INTRAVENOUS | Status: DC
  Administered 2021-01-13 – 2021-01-15 (×7): 1250 mg via INTRAVENOUS
  Filled 2021-01-13 (×9): qty 250

## 2021-01-13 MED ORDER — FUROSEMIDE 10 MG/ML IJ SOLN
40.0000 mg | Freq: Every day | INTRAMUSCULAR | Status: AC
  Administered 2021-01-13 – 2021-01-15 (×2): 40 mg via INTRAVENOUS
  Filled 2021-01-13 (×3): qty 4

## 2021-01-13 MED ORDER — ORAL CARE MOUTH RINSE
15.0000 mL | Freq: Two times a day (BID) | OROMUCOSAL | Status: DC
  Administered 2021-01-13 – 2021-01-28 (×28): 15 mL via OROMUCOSAL

## 2021-01-13 MED ORDER — ALBUMIN HUMAN 25 % IV SOLN
25.0000 g | Freq: Four times a day (QID) | INTRAVENOUS | Status: AC
  Administered 2021-01-13 – 2021-01-15 (×7): 25 g via INTRAVENOUS
  Filled 2021-01-13 (×8): qty 100

## 2021-01-13 MED ORDER — ENSURE ENLIVE PO LIQD
237.0000 mL | Freq: Three times a day (TID) | ORAL | Status: DC
  Administered 2021-01-13 – 2021-01-26 (×17): 237 mL via ORAL

## 2021-01-13 MED ORDER — ADULT MULTIVITAMIN W/MINERALS CH
1.0000 | ORAL_TABLET | Freq: Every day | ORAL | Status: DC
  Administered 2021-01-13 – 2021-01-28 (×15): 1 via ORAL
  Filled 2021-01-13 (×16): qty 1

## 2021-01-13 NOTE — Assessment & Plan Note (Signed)
-  Patient denied Chest Pain but had Nausea but no vomiting -Had ST Elevation in leads V1-V2 without Reciprocal changes -Troponin Checked and went from 24 -> 31 -Cardiology Consulted and checked Limited ECHO -ECHO showed no WMA -Cardiology does not feel he has a STEMI and thinks that his ST elevations are more pronounced in the setting of pain and hypertension

## 2021-01-13 NOTE — Assessment & Plan Note (Addendum)
-   persistently bacteremic - follow up repeat blood cx 7/22: Negative x 4 days so far - blood cx also repeated on 7/24 per ID, also NGTD - blood cx also being ordered 7/26 per ID, also NGTD - continue vanc and ceftaroline; tentative plan is 10-14 day double coverage from neg cx, then back to mono vanc to complete course - ID following - see epidural abscess

## 2021-01-13 NOTE — Assessment & Plan Note (Addendum)
-   ongoing erosion noted on repeat CT 7/20 of L3 and L4 vertebral bodies; no pathologic fracture - starting to be lethargic; likely too much opioids now; regimen adjusted on 7/27; will need further slow tapering as he recovers some

## 2021-01-13 NOTE — Assessment & Plan Note (Addendum)
-   Patient's BMI is Body mass index is 28.56 kg/m.. - Patient has the following signs/symptoms consistent with PCM: (fat loss, muscle loss, muscle wasting). - seen by RD, appreciate assistance. Continue plan per RD -Prealbumin 5.9 mg/dL - too much N/V on 0/30; poor intake; may need cortrak to start TF but on hold for now until N/V improves; unable to place CVL for TPN due to persistent bacteremia and high risk of line infection - oral intake remains severely poor - cortrak placed post-pyloric on 7/25; start TF; needs several days of enteral nutrition to rebuild some strength

## 2021-01-13 NOTE — Progress Notes (Signed)
Initial Nutrition Assessment  DOCUMENTATION CODES:   Severe malnutrition in context of acute illness/injury  INTERVENTION:   - 24-hour calorie count to start today at 1200, RD to follow up tomorrow for results  - Ensure Enlive po TID, each supplement provides 350 kcal and 20 grams of protein  - Liberalize diet to Regular, verbal with readback order placed per MD  - MVI with minerals daily  - Encourage PO intake  NUTRITION DIAGNOSIS:   Severe Malnutrition related to acute illness (MRSA bacteremia) as evidenced by mild fat depletion, moderate muscle depletion, energy intake < or equal to 50% for > or equal to 5 days.  GOAL:   Patient will meet greater than or equal to 90% of their needs  MONITOR:   PO intake, Supplement acceptance, Labs, Weight trends, Skin, I & O's  REASON FOR ASSESSMENT:   Consult Assessment of nutrition requirement/status  ASSESSMENT:   55 year old male who presented to the ED on 7/13 with increased back pain after thoracic and lumbar laminectomies on 12/21/20. Pt with recent admission for epidural abscess, vertebral osteomyelitis, psoas abscess, and MRSA bacteremia. PMH of CAD, T2DM, HTN, HLD, CAD s/p CABG in 2017, anxiety.  7/16 - s/p US aspiration of lumbar paraspinal collection 7/19 - s/p TEE without vegetation  Met with pt and wife at bedside. MD in room finishing up with assessment. Pt reports that he has no appetite. Pt's wife shares that pt has had very little PO intake for over a month. She states that pt's decreased appetite and poor PO intake began a little before Father's Day of this year (12/12/20). Pt's wife shares that since that time, pt has only consumed 4-5 "true meals." Pt's wife shares that pt does better with liquids and has been consuming a lot of water, juice, and diet soda. Pt's wife reports that since pt has been admitted, he has consumed only ice pops, peaches, and a few bites of other foods. Pt's wife has offered to bring pt  whatever he wants but he states that he doesn't want anything.  Pt's wife reports that pt's UBW is 210-211 lbs. She reports that pt has lost weight since June and that his face has "thinned out." Current weight is 208 lbs per bed scale. However, pt with mild edema. Per pt's wife, MD starting pt back on lasix.  Reviewed weight history in chart. Pt with a 3.3 kg weight loss since 12/25/20. This is a 3.4% weight loss in less than 3 weeks which is not quite significant for timeframe but is concerning. Suspect pt's current weight is falsely elevated related to edema and that pt's true dry weight is much lower than current weight.  Pt willing to try drinking Ensure Enlive oral nutrition supplements to aid in meeting kcal and protein needs. Discussed need for pt to increase PO intake to prevent additional dry weight loss and promote healing. RD to start 24-hour calorie count and reassess tomorrow. RN aware of calorie count order. RD placed envelope on pt's door.  Pt denies any issues chewing or swallowing. He also denies any N/V. Pt believes that his pain is contributing to his poor appetite.  Discussed diet liberalization with MD who agreed; verbal with readback order placed for Regular diet.  Briefly discussed option for Cortrak NG tube with pt's wife and with MD. Will monitor outcome of calorie count and make recommendations from there.  Meal Completion: 0-50%  Medications reviewed and include: diflucan, lasix, SSI q 4 hours, lactulose, nystatin, protonix, miralax,  IV albumin, IV abx  Labs reviewed: sodium 131 CBG's: 133-193 x 24 hours  UOP: 1450 ml x 24 hours I/O's: -4.9 L since admit  NUTRITION - FOCUSED PHYSICAL EXAM:  Flowsheet Row Most Recent Value  Orbital Region Mild depletion  Upper Arm Region No depletion  Thoracic and Lumbar Region Mild depletion  Buccal Region Mild depletion  Temple Region Mild depletion  Clavicle Bone Region Moderate depletion  Clavicle and Acromion Bone Region  Moderate depletion  Scapular Bone Region Mild depletion  Dorsal Hand Mild depletion  Patellar Region Mild depletion  Anterior Thigh Region Moderate depletion  Posterior Calf Region Mild depletion  Edema (RD Assessment) Mild  Hair Reviewed  Eyes Reviewed  Mouth Reviewed  Skin Reviewed  Nails Reviewed       Diet Order:   Diet Order             Diet regular Room service appropriate? Yes; Fluid consistency: Thin  Diet effective now                   EDUCATION NEEDS:   Education needs have been addressed  Skin:  Skin Assessment: Skin Integrity Issues: Incisions: back  Last BM:  01/11/21 small type 6  Height:   Ht Readings from Last 1 Encounters:  12/21/20 _0  (1.803 m)    Weight:   Wt Readings from Last 1 Encounters:  01/13/21 94.3 kg    BMI:  Body mass index is 29.01 kg/m.  Estimated Nutritional Needs:   Kcal:  2300-2500  Protein:  115-130 grams  Fluid:  >/= 2.0 L    Gustavus Bryant, MS, RD, LDN Inpatient Clinical Dietitian Please see [facility] for contact information.

## 2021-01-13 NOTE — Assessment & Plan Note (Addendum)
-  Despite severe weakness, patient fell out of bed on ~01/10/2021? -Repeat head CT showed no acute intracranial abnormalities noted -Fall Precautions -Waist belt for safety if needed

## 2021-01-13 NOTE — Assessment & Plan Note (Signed)
-  Replete and recheck as needed 

## 2021-01-13 NOTE — Progress Notes (Signed)
Pharmacy Antibiotic Note  Louis [name] is a 55 y.o. male admitted on 01/05/2021 with increased back pain, has a known and recent history of MRSA bacteremia and epidural abscess, vertebral osteomyelitis and psoas abscess. He was seen by ID during an admission in June where he underwent neurosurgery to evacuate his epidural abscess. He was put on daptomycin as an outpatient to complete therapy in late July but he is presenting now with worsening symptoms and positive blood cultures. His cultures were sent out for additional sensitivity testing for concern over developed resistance. The results are now back and his MRSA isolate is daptomycin resistant. Since we have been unable to clear his blood cultures we will go back to using two active MRSA agents, vancomycin and ceftaroline until he is able to clear his cultures. Neurosurgery will not be doing any intervention.    Plan: Restart Vancomycin to 1250 mg IV Q 8 hrs. Goal AUC 400-550. Stop daptomycin Continue ceftaroline  Recheck vanc levels at steady state    Weight: 92.9 kg (204 lb 12.9 oz)  Temp (24hrs), Avg:97.8 F (36.6 C), Min:97.4 F (36.3 C), Max:98 F (36.7 C)  Recent Labs  Lab 01/09/21 2141 01/10/21 0731 01/10/21 1001 01/11/21 0226 01/12/21 0330 01/13/21 0846  WBC 32.1*  --  31.0* 26.5* 21.5* 21.3*  CREATININE 0.53*  --  0.58* 0.38* 0.49* 0.42*  VANCOTROUGH  --  9*  --   --   --   --   VANCOPEAK 22*  --   --   --   --   --      Estimated Creatinine Clearance: 118.6 mL/min (A) (by C-G formula based on SCr of 0.42 mg/dL (L)).    No Known Allergies  Antimicrobials this admission: Vancomycin 7/14>>7/20, 7/21>> Ceftaroline 7/20>> Daptomycin 7/20 x1   Microbiology results: 7/13 BCx: MRSA  7/15 BCx >>1/3 MRSA  7/17 BCx >> 3/4 GPCs in clusters  7/19 Bcx >> MRSA 7/20 Bcx >> GPC   Thank you for allowing pharmacy to be a part of this patient's care.  Jettie Pagan, PharmD, BCIDP Infectious Disease Pharmacist  Phone:  [phone] Please refer to [facility] for unit-specific pharmacist

## 2021-01-13 NOTE — Assessment & Plan Note (Addendum)
-  No anginal complaints - s/p CABG -He had a LIMA to LAD, vein graft to distal circumflex and vein graft RCA in 2017. -His catheterization in March 2020 demonstrated patent vein grafts -Continue aspirin, statin, BB - resume ARB as BP trending back up

## 2021-01-13 NOTE — Progress Notes (Signed)
[facility]  Date of Admission:  01/05/2021     CC: Mrsa bacteremia  Lines: Peripheral iv's  Abx: 7/20-c ceftaroline 7/14-20; 7/21-c vanc  7/20 dapto  Previously daptomycin  ASSESSMENT: 55 yo male recent mrsa bacteremia/epidural abscess/OM and psoas abscess s/p open debridement 6/28 and IR drainage of left psoas discharged on daptomycin, now readmitted 7/13 for worsening back pain of a few days found to have recurrent mrsa bacteremia  7/13 & 7/15 & 7/17 bcx mrsa  7/19 bcx ngtd  7/13 mri lumbar/thoracic showed several areas of fluid collection with a more loculated focus at L4-5. There is no focal LE neurologic deficit or urinary/bowel incontinence. NSG reviewed case and felt no urgent decompression at this time  7/16 IR reviewed case and percutaneously drained the 5 cm L3 level subdural fluid collection.  7/17 tte no vegetation  Patient remains with significant leukocytosis. His fever so far had resolved.   Concern for drug resistance vs source control, and with recurrent mrsa bsi endocarditis  --------------- 7/21 assessment Stable moderate back pain Sensitivity from blood cx on 7/13 came back; daptomycin resistant Afebrile/wbc improving Discussed with Dr Jake Samples of NSG --> no target to debride in back Repeat abd/pelv ct 7/20 with contrast no intraabd abscess Tee from 7/19 no valve vegetation  Started combination antibiotics therapy 7/20; switching dapto to vanc today. Hopefully he clears bacteremia and we can switch back to vanc monotherapy  PLAN: Switch antibiotics to vanc and ceftaroline today Repeat blood culture tomorrow If blood culture stay persistently negative for 5 days we can consider switching back to monotherapy Appreciate hospitalist & nsg team caring for patient Discussed with primary team/nsg team Discussed with patient and his wife the plan   I spent more than 35 minute reviewing data/chart, and  coordinating care and >50% direct face to face time providing counseling/discussing diagnostics/treatment plan with patient     Principal Problem:   MRSA bacteremia Active Problems:   CAD (coronary artery disease)   HTN (hypertension)   Uncontrolled diabetes mellitus (HCC)   Epidural abscess   Normocytic anemia   Hyponatremia   Anxiety   Chronic diastolic CHF (congestive heart failure) (HCC)   No Known Allergies  Scheduled Meds:  aspirin EC  81 mg Oral Daily   atorvastatin  40 mg Oral QHS   enoxaparin (LOVENOX) injection  40 mg Subcutaneous QHS   fluconazole  100 mg Oral Daily   furosemide  40 mg Intravenous Daily   gabapentin  200 mg Oral TID   insulin aspart  0-9 Units Subcutaneous Q4H   lactulose  10 g Oral Daily   losartan  25 mg Oral Daily   metoprolol succinate  25 mg Oral Daily   nystatin  5 mL Oral QID   oxyCODONE  20 mg Oral Q12H   pantoprazole  40 mg Oral Daily   polyethylene glycol  17 g Oral BID   venlafaxine XR  75 mg Oral q morning   Continuous Infusions:  albumin human 25 g (01/13/21 1108)   ceFTAROline (TEFLARO) IV 600 mg (01/13/21 0609)   vancomycin     PRN Meds:.acetaminophen **OR** [DISCONTINUED] acetaminophen, ALPRAZolam, alum & mag hydroxide-simeth, cyclobenzaprine, diclofenac Sodium, HYDROmorphone (DILAUDID) injection, labetalol, lidocaine, ondansetron **OR** ondansetron (ZOFRAN) IV   SUBJECTIVE: No f/c Repeat bcx 7/20 ngtd Sensitivity returns from 7/13 bcx; dapto resistant  No n/v/diarrhea/rash Back pain stable moderate with episodes of severe intensity No numbness/tingling/weakness in le or bowel/bladder  incontinence  Review of Systems: ROS All other ROS was negative, except mentioned above     OBJECTIVE: Vitals:   01/12/21 1949 01/12/21 2325 01/13/21 0303 01/13/21 0753  BP: 115/74 112/75 109/71 134/83  Pulse: 95 94 97 100  Resp: 12 18 15 16   Temp: (!) 97.4 F (36.3 C) 98 F (36.7 C) 98 F (36.7 C) 98 F (36.7 C)   TempSrc: Axillary Axillary Axillary Oral  SpO2: 98% 94% 93% 94%  Weight:       Body mass index is 28.56 kg/m.  Physical Exam General/constitutional: no distress, pleasant HEENT: Normocephalic, PER, Conj Clear, EOMI, Oropharynx clear Neck supple CV: rrr no mrg Lungs: clear to auscultation, normal respiratory effort Abd: Soft, Nontender Ext: no edema Skin: No Rash Neuro: nonfocal MSK: no back tenderness; surgical incision all healed without fluctuance/erythema    Lab Results Lab Results  Component Value Date   WBC 21.3 (H) 01/13/2021   HGB 11.6 (L) 01/13/2021   HCT 35.5 (L) 01/13/2021   MCV 86.8 01/13/2021   PLT 626 (H) 01/13/2021    Lab Results  Component Value Date   CREATININE 0.42 (L) 01/13/2021   BUN 5 (L) 01/13/2021   NA 131 (L) 01/13/2021   K 3.9 01/13/2021   CL 94 (L) 01/13/2021   CO2 25 01/13/2021    Lab Results  Component Value Date   ALT 18 01/12/2021   AST 23 01/12/2021   ALKPHOS 116 01/12/2021   BILITOT 0.9 01/12/2021      Microbiology: Recent Results (from the past 240 hour(s))  Blood culture (routine x 2)     Status: Abnormal (Preliminary result)   Collection Time: 01/05/21  5:20 PM   Specimen: BLOOD  Result Value Ref Range Status   Specimen Description BLOOD SITE NOT SPECIFIED  Final   Special Requests   Final    BOTTLES DRAWN AEROBIC AND ANAEROBIC Blood Culture adequate volume   Culture  Setup Time   Final    GRAM POSITIVE COCCI IN BOTH AEROBIC AND ANAEROBIC BOTTLES CRITICAL VALUE NOTED.  VALUE IS CONSISTENT WITH PREVIOUSLY REPORTED AND CALLED VALUE.    Culture (A)  Final    METHICILLIN RESISTANT STAPHYLOCOCCUS AUREUS Sent to [facility] for further susceptibility testing. Performed at [facility], [address] [place], [place] [id number]    Report Status PENDING  Incomplete   Organism ID, Bacteria METHICILLIN RESISTANT STAPHYLOCOCCUS AUREUS  Final      Susceptibility   Methicillin resistant staphylococcus aureus - MIC*     CIPROFLOXACIN >=8 RESISTANT Resistant     ERYTHROMYCIN >=8 RESISTANT Resistant     GENTAMICIN <=0.5 SENSITIVE Sensitive     OXACILLIN >=4 RESISTANT Resistant     TETRACYCLINE <=1 SENSITIVE Sensitive     VANCOMYCIN 1 SENSITIVE Sensitive     TRIMETH/SULFA >=320 RESISTANT Resistant     CLINDAMYCIN <=0.25 SENSITIVE Sensitive     RIFAMPIN <=0.5 SENSITIVE Sensitive     Inducible Clindamycin NEGATIVE Sensitive     * METHICILLIN RESISTANT STAPHYLOCOCCUS AUREUS  Blood culture (routine x 2)     Status: Abnormal   Collection Time: 01/05/21  6:10 PM   Specimen: BLOOD  Result Value Ref Range Status   Specimen Description BLOOD SITE NOT SPECIFIED  Final   Special Requests   Final    BOTTLES DRAWN AEROBIC AND ANAEROBIC Blood Culture results may not be optimal due to an inadequate volume of blood received in culture bottles   Culture  Setup Time  Final    GRAM POSITIVE COCCI IN BOTH AEROBIC AND ANAEROBIC BOTTLES CRITICAL VALUE NOTED.  VALUE IS CONSISTENT WITH PREVIOUSLY REPORTED AND CALLED VALUE.    Culture (A)  Final    STAPHYLOCOCCUS AUREUS SUSCEPTIBILITIES PERFORMED ON PREVIOUS CULTURE WITHIN THE LAST 5 DAYS. Performed at [facility], [address], [place], [place] [address]    Report Status 01/08/2021 FINAL  Final  Resp Panel by RT-PCR (Flu A&B, Covid) Nasopharyngeal Swab     Status: None   Collection Time: 01/05/21  9:32 PM   Specimen: Nasopharyngeal Swab; Nasopharyngeal(NP) swabs in vial transport medium  Result Value Ref Range Status   SARS Coronavirus 2 by RT PCR NEGATIVE NEGATIVE Final    Comment: (NOTE) SARS-CoV-2 target nucleic acids are NOT DETECTED.  The SARS-CoV-2 RNA is generally detectable in upper respiratory specimens during the acute phase of infection. The lowest concentration of SARS-CoV-2 viral copies this assay can detect is 138 copies/mL. A negative result does not preclude SARS-Cov-2 infection and should not be used as the sole basis for treatment or other  patient management decisions. A negative result may occur with  improper specimen collection/handling, submission of specimen other than nasopharyngeal swab, presence of viral mutation(s) within the areas targeted by this assay, and inadequate number of viral copies(<138 copies/mL). A negative result must be combined with clinical observations, patient history, and epidemiological information. The expected result is Negative.  Fact Sheet for Patients:  BloggerCourse.com  Fact Sheet for Healthcare Providers:  SeriousBroker.it  This test is no t yet approved or cleared by the [place] FDA and  has been authorized for detection and/or diagnosis of SARS-CoV-2 by FDA under an Emergency Use Authorization (EUA). This EUA will remain  in effect (meaning this test can be used) for the duration of the COVID-19 declaration under Section 564(b)(1) of the Act, 21 U.S.C.section 360bbb-3(b)(1), unless the authorization is terminated  or revoked sooner.       Influenza A by PCR NEGATIVE NEGATIVE Final   Influenza B by PCR NEGATIVE NEGATIVE Final    Comment: (NOTE) The Xpert Xpress SARS-CoV-2/FLU/RSV plus assay is intended as an aid in the diagnosis of influenza from Nasopharyngeal swab specimens and should not be used as a sole basis for treatment. Nasal washings and aspirates are unacceptable for Xpert Xpress SARS-CoV-2/FLU/RSV testing.  Fact Sheet for Patients: BloggerCourse.com  Fact Sheet for Healthcare Providers: SeriousBroker.it  This test is not yet approved or cleared by the [place] FDA and has been authorized for detection and/or diagnosis of SARS-CoV-2 by FDA under an Emergency Use Authorization (EUA). This EUA will remain in effect (meaning this test can be used) for the duration of the COVID-19 declaration under Section 564(b)(1) of the Act, 21 U.S.C. section  360bbb-3(b)(1), unless the authorization is terminated or revoked.  Performed at [facility], [address], [place], [place] [address]   Culture, blood (routine x 2)     Status: Abnormal   Collection Time: 01/07/21  6:52 AM   Specimen: BLOOD  Result Value Ref Range Status   Specimen Description BLOOD RIGHT ANTECUBITAL  Final   Special Requests   Final    BOTTLES DRAWN AEROBIC AND ANAEROBIC Blood Culture adequate volume   Culture  Setup Time   Final    GRAM POSITIVE COCCI IN CLUSTERS ANAEROBIC BOTTLE ONLY CRITICAL VALUE NOTED.  VALUE IS CONSISTENT WITH PREVIOUSLY REPORTED AND CALLED VALUE.    Culture (A)  Final    STAPHYLOCOCCUS AUREUS  SUSCEPTIBILITIES PERFORMED ON PREVIOUS CULTURE WITHIN THE LAST 5 DAYS. Performed at [facility], [address] [place], [place] [address]    Report Status 01/10/2021 FINAL  Final  Culture, blood (routine x 2)     Status: None   Collection Time: 01/07/21  6:52 AM   Specimen: BLOOD RIGHT HAND  Result Value Ref Range Status   Specimen Description BLOOD RIGHT HAND  Final   Special Requests   Final    BOTTLES DRAWN AEROBIC ONLY Blood Culture results may not be optimal due to an inadequate volume of blood received in culture bottles   Culture   Final    NO GROWTH 5 DAYS Performed at [facility], [address], [place], [place] [address]    Report Status 01/12/2021 FINAL  Final  Aerobic/Anaerobic Culture w Gram Stain (surgical/deep wound)     Status: None (Preliminary result)   Collection Time: 01/08/21 10:10 AM   Specimen: Wound  Result Value Ref Range Status   Specimen Description WOUND  Final   Special Requests NONE  Final   Gram Stain   Final    ABUNDANT WBC PRESENT, PREDOMINANTLY PMN NO ORGANISMS SEEN    Culture   Final    NO GROWTH 4 DAYS NO ANAEROBES ISOLATED; CULTURE IN PROGRESS FOR 5 DAYS Performed at [facility], [address], [place], [place] [address]    Report Status PENDING  Incomplete   Culture, blood (Routine X 2) w Reflex to ID Panel     Status: Abnormal   Collection Time: 01/09/21 10:26 AM   Specimen: BLOOD  Result Value Ref Range Status   Specimen Description BLOOD RIGHT ANTECUBITAL  Final   Special Requests   Final    BOTTLES DRAWN AEROBIC AND ANAEROBIC Blood Culture adequate volume   Culture  Setup Time   Final    GRAM POSITIVE COCCI IN CLUSTERS ANAEROBIC BOTTLE ONLY CRITICAL VALUE NOTED.  VALUE IS CONSISTENT WITH PREVIOUSLY REPORTED AND CALLED VALUE.    Culture (A)  Final    STAPHYLOCOCCUS AUREUS SUSCEPTIBILITIES PERFORMED ON PREVIOUS CULTURE WITHIN THE LAST 5 DAYS. Performed at [facility], [address] [place], [place] [address]    Report Status 01/12/2021 FINAL  Final  Culture, blood (Routine X 2) w Reflex to ID Panel     Status: Abnormal   Collection Time: 01/09/21 10:49 AM   Specimen: BLOOD LEFT HAND  Result Value Ref Range Status   Specimen Description BLOOD LEFT HAND  Final   Special Requests   Final    BOTTLES DRAWN AEROBIC AND ANAEROBIC Blood Culture results may not be optimal due to an inadequate volume of blood received in culture bottles   Culture  Setup Time   Final    GRAM POSITIVE COCCI IN CLUSTERS IN BOTH AEROBIC AND ANAEROBIC BOTTLES CRITICAL VALUE NOTED.  VALUE IS CONSISTENT WITH PREVIOUSLY REPORTED AND CALLED VALUE.    Culture (A)  Final    STAPHYLOCOCCUS AUREUS SUSCEPTIBILITIES PERFORMED ON PREVIOUS CULTURE WITHIN THE LAST 5 DAYS. Performed at [facility], [address], [place], [place] [address]    Report Status 01/12/2021 FINAL  Final  Culture, blood (routine x 2)     Status: None (Preliminary result)   Collection Time: 01/11/21  2:02 PM   Specimen: BLOOD RIGHT HAND  Result Value Ref Range Status   Specimen Description BLOOD RIGHT HAND  Final   Special Requests   Final  BOTTLES DRAWN AEROBIC AND ANAEROBIC Blood Culture adequate volume   Culture  Setup Time   Final    GRAM POSITIVE COCCI IN  CLUSTERS ANAEROBIC BOTTLE ONLY CRITICAL RESULT CALLED TO, READ BACK BY AND VERIFIED WITH: J. LEDFORD PHARMD, AT 0630 01/12/21 D. VANHOOK    Culture   Final    NO GROWTH < 24 HOURS Performed at [facility], [address] [place], [place] [address]    Report Status PENDING  Incomplete  Culture, blood (routine x 2)     Status: Abnormal (Preliminary result)   Collection Time: 01/11/21  2:03 PM   Specimen: BLOOD LEFT HAND  Result Value Ref Range Status   Specimen Description BLOOD LEFT HAND  Final   Special Requests   Final    BOTTLES DRAWN AEROBIC AND ANAEROBIC Blood Culture adequate volume   Culture  Setup Time   Final    GRAM POSITIVE COCCI IN CLUSTERS ANAEROBIC BOTTLE ONLY CRITICAL VALUE NOTED.  VALUE IS CONSISTENT WITH PREVIOUSLY REPORTED AND CALLED VALUE.    Culture (A)  Final    STAPHYLOCOCCUS AUREUS SUSCEPTIBILITIES TO FOLLOW Performed at [facility], [address] [place], [place] [address]    Report Status PENDING  Incomplete  Culture, blood (routine x 2)     Status: None (Preliminary result)   Collection Time: 01/12/21 11:47 AM   Specimen: BLOOD RIGHT HAND  Result Value Ref Range Status   Specimen Description BLOOD RIGHT HAND  Final   Special Requests   Final    BOTTLES DRAWN AEROBIC AND ANAEROBIC Blood Culture results may not be optimal due to an inadequate volume of blood received in culture bottles   Culture  Setup Time   Final    GRAM POSITIVE COCCI IN CLUSTERS IN BOTH AEROBIC AND ANAEROBIC BOTTLES CRITICAL VALUE NOTED.  VALUE IS CONSISTENT WITH PREVIOUSLY REPORTED AND CALLED VALUE. Performed at [facility], [address], [place], [place] [address]    Culture [facility] POSITIVE COCCI  Final   Report Status PENDING  Incomplete     Serology:   Imaging: If present, new imagings (plain films, ct scans, and mri) have been personally visualized and interpreted; radiology reports have been reviewed. Decision making incorporated into the  Impression / Recommendations.  7/13 mri thoracic lumbar Paraspinal and other soft tissues: Patient is status post left laminectomy at L3. Fluid tracks from the laminectomy site into a subcutaneous and deep soft tissue collection with peripheral enhancement measuring 4.7 x 5.0 x 1.7 cm. The ventral epidural collection extends inferiorly to the L2 level. Separate prominent ventral collection is present posterior to L4 prominently right the left. This collection measures 2.9 x 1.3 cm on the sagittal images.   Disc levels:   In addition to the epidural collections, disc disease is again noted at L4-5 and L5-S1. Central foraminal narrowing are associated.   IMPRESSION: 1. T7 laminectomy subcutaneous peripherally enhancing fluid collection at this level as well. 2. Residual posterior epidural fluid collection in the thoracic spine with similar cephalo caudad extension from T5-6-T9 but less mass effect. 3. Progressive signal abnormality and enhancement throughout the T7 and T8 vertebral bodies in the T7-8 thoracic disc consistent with disc osteomyelitis. 4. Progressive paraspinous soft tissue enhancement at T3-4 through T9-10 without drainable collection. 5. Persistent extensive ventral epidural collection compatible with abscess. 6. Status post left laminectomy at L3 with peripherally enhancing fluid collection extending from the operative site into the subcutaneous and deep tissues as  described. This is concerning abscess. 7. Fluid tracks from the laminectomy site into a subdural deep soft tissue collection measuring 4.7 x 5.0 x 1.7 cm. 8. Separate ventral epidural collection at L4-5 and L5-S1 consistent with epidural abscess. 9. Progressive signal abnormality and enhancement in the L3 and L4 vertebral bodies. 10. Although there is not definite enhancement in the L4-5 and L5-S1 discs, progressive T2 signal and paravertebral soft tissue enhancement is highly concerning for  progressive disc osteomyelitis.   7/17 tte  1. Left ventricular ejection fraction, by estimation, is 55 to 60%. The  left ventricle has normal function. The left ventricle has no regional  wall motion abnormalities. There is mild concentric left ventricular  hypertrophy.   2. Right ventricular systolic function is normal. The right ventricular  size is normal.   3. The mitral valve is grossly normal. Trivial mitral valve  regurgitation. No evidence of mitral stenosis.   4. The aortic valve is grossly normal. Aortic valve regurgitation is not  visualized. No aortic stenosis is present.   5. The inferior vena cava is normal in size with greater than 50%  respiratory variability, suggesting right atrial pressure of 3 mmHg.   7/19 tee No valvular vegetation  7/20 ct abd pelv with contrast Progressive erosive changes involving the L3 and L4 vertebral bodies in keeping with progressive changes of discitis osteomyelitis. No pathologic fracture. Increasing paraspinal inflammatory collections adjacent to L3-L5 bilaterally without discrete drainable fluid at this time.   Progressive anasarca with development of pleural effusions, mild ascites, and moderate diffuse subcutaneous body wall edema.   Aortic Atherosclerosis    Raymondo Band, MD [facility] [phone] pager    01/13/2021, 11:37 AM

## 2021-01-13 NOTE — Assessment & Plan Note (Signed)
-   see abscess

## 2021-01-13 NOTE — Progress Notes (Signed)
Inpatient Rehab Admissions Coordinator Note:   Per PT recommendations, pt was screened for CIR candidacy by Estill Dooms, PT, DPT.  At this time pt appears to be limited by pain and not at a level to tolerate CIR yet.  Will follow from a distance for progress with therapies and reassess either tomorrow or Monday.  Please contact me with questions.   Estill Dooms, PT, DPT [phone] 01/13/21 4:13 PM

## 2021-01-13 NOTE — Assessment & Plan Note (Addendum)
-  Appears chronic, possibly related to his SNRI but may also be hypovolemia from infection/fever/insensible losses; also poor PO intake -Checked urine sodium was 14 and Urine Osm was 484 -BMP daily

## 2021-01-13 NOTE — Evaluation (Signed)
Occupational Therapy Evaluation Patient Details Name: Louis [name] MRN: [id number] DOB: 25-Jul-1965 Today's Date: 01/13/2021    History of Present Illness Pt is a 55 y.o. male who presented 7/13 with worsening back pain with recurrent MRSA bacteremia after discharging 7/3 from being admitted with epidural abscess, vertebral osteomyelitis L3-S1, psoas abscess, and MRSA bacteremia with s/p 12/21/20 T7-8, T12-L1, L3-4 laminectomies for evacuation of epidural abscess. 7/13 mri lumbar/thoracic showed several areas of fluid collection with a more loculated focus at L4-5. 7/20 imaging revealed progressive erosive changes involving the L3 and L4 vertebral bodies  in keeping with progressive changes of discitis osteomyelitis. PMH: CAD, hypertension, uncontrolled diabetes mellitus.   Clinical Impression   Pt admitted with above. He demonstrates the below listed deficits and will benefit from continued OT to maximize safety and independence with BADLs. Pt presents to OT with significant back pain, decreased activity tolerance, and generalized weakness that impacts his ability to perform ADLs.  He was only able to transition to EOB for brief period of time before having to return to supine.  He requires min A for UB ADLs, and max A - total A for LB ADLs.  He lives with his wife and reports he was fully independent with ADLs and IADLs before last admission.  He has required assist for all aspects of mobility and ADLs for ~1 week PTA due to pain.  Anticipate he will require CIR.      Follow Up Recommendations  CIR    Equipment Recommendations  None recommended by OT    Recommendations for Other Services Rehab consult     Precautions / Restrictions Precautions Precautions: Back Precaution Booklet Issued: No Precaution Comments: attempted to instructe pt in precautions, but pt distracted by pain Restrictions Weight Bearing Restrictions: No      Mobility Bed Mobility Overal bed mobility: Needs  Assistance Bed Mobility: Supine to Sit;Sit to Supine     Supine to sit: Mod assist;+2 for physical assistance;HOB elevated Sit to supine: Mod assist;+2 for physical assistance;HOB elevated   General bed mobility comments: Attempted to transition to sitting L EOB through scooting legs off EOB as pt pivoted on buttocks with HOB elevated, pt needing modAx2 to initiate but able to display good active participation. ModAx2 to manage legs, primarily the L, back onto bed and direct his trunk.    Transfers                 General transfer comment: Deferred due to pain.    Balance Overall balance assessment: Needs assistance Sitting-balance support: Bilateral upper extremity supported;Feet supported Sitting balance-Leahy Scale: Poor Sitting balance - Comments: Reliance on UEs to relieve pain, tolerating sitting EOB only a few minutes before requesting to return to supine due to pain. Min guard.       Standing balance comment: Deferred due to pain.                           ADL either performed or assessed with clinical judgement   ADL Overall ADL's : Needs assistance/impaired Eating/Feeding: Set up Eating/Feeding Details (indicate cue type and reason): pt with poor appetite Grooming: Wash/dry hands;Wash/dry face;Oral care;Set up;Bed level   Upper Body Bathing: Minimal assistance;Bed level   Lower Body Bathing: Maximal assistance;Bed level   Upper Body Dressing : Moderate assistance;Bed level   Lower Body Dressing: Total assistance;Bed level   Toilet Transfer: Total assistance Toilet Transfer Details (indicate cue type and reason): unable Toileting- Clothing  Manipulation and Hygiene: Total assistance       Functional mobility during ADLs: Moderate assistance;+2 for physical assistance;+2 for safety/equipment (bed mobility only) General ADL Comments: pt limited by pain     Vision         Perception     Praxis      Pertinent Vitals/Pain Pain  Assessment: Faces Faces Pain Scale: Hurts whole lot Pain Location: bil leg and low back Pain Descriptors / Indicators: Discomfort;Grimacing;Guarding;Constant Pain Intervention(s): Limited activity within patient's tolerance;Premedicated before session;Repositioned     Hand Dominance Right   Extremity/Trunk Assessment Upper Extremity Assessment Upper Extremity Assessment: Generalized weakness   Lower Extremity Assessment Lower Extremity Assessment: Defer to PT evaluation   Cervical / Trunk Assessment Cervical / Trunk Assessment: Other exceptions Cervical / Trunk Exceptions: s/p lumbar laminectomy for epidual abscess   Communication Communication Communication: No difficulties   Cognition Arousal/Alertness: Awake/alert;Lethargic Behavior During Therapy: Flat affect Overall Cognitive Status: Impaired/Different from baseline Area of Impairment: Memory;Attention;Problem solving;Following commands                   Current Attention Level: Focused;Sustained Memory: Decreased recall of precautions Following Commands: Follows one step commands inconsistently;Follows one step commands with increased time       General Comments: Pt distracted by pain   General Comments  HR to 120s with transition from supine to sit    Exercises     Shoulder Instructions      Home Living Family/patient expects to be discharged to:: Private residence Living Arrangements: Spouse/significant other Available Help at Discharge: Family;Available 24 hours/day Type of Home: House Home Access: Stairs to enter [employer] of Steps: 4 Entrance Stairs-Rails: Can reach both Home Layout: One level     Bathroom Shower/Tub: Producer, television/film/video: Standard     Home Equipment: Grab bars - tub/shower;Shower seat - built in;Cane - single point;Walker - 2 wheels;Bedside commode;Shower seat;Crutches;Wheelchair - manual          Prior Functioning/Environment Level of  Independence: Independent        Comments: works at [employer]. following most recent admission pt was using RW for mobility and needing supervision for mobility.        OT Problem List: Decreased strength;Decreased activity tolerance;Impaired balance (sitting and/or standing);Decreased knowledge of use of DME or AE;Pain;Decreased knowledge of precautions      OT Treatment/Interventions: Self-care/ADL training;Therapeutic exercise;Energy conservation;DME and/or AE instruction;Therapeutic activities;Cognitive remediation/compensation;Patient/family education;Balance training;Neuromuscular education    OT Goals(Current goals can be found in the care plan section) Acute Rehab OT Goals Patient Stated Goal: to reduce pain OT Goal Formulation: With patient/family Time For Goal Achievement: 01/27/21 Potential to Achieve Goals: Good ADL Goals Pt Will Perform Grooming: with set-up;sitting Pt Will Perform Upper Body Bathing: with set-up;sitting Pt Will Perform Lower Body Bathing: with min assist;with adaptive equipment;sit to/from stand Pt Will Perform Upper Body Dressing: with set-up;sitting Pt Will Perform Lower Body Dressing: with min assist;with adaptive equipment;sit to/from stand Pt Will Transfer to Toilet: with min assist;stand pivot transfer;bedside commode Pt Will Perform Toileting - Clothing Manipulation and hygiene: with min assist;sit to/from stand  OT Frequency: Min 2X/week   Barriers to D/C:            Co-evaluation PT/OT/SLP Co-Evaluation/Treatment: Yes Reason for Co-Treatment: For patient/therapist safety;To address functional/ADL transfers PT goals addressed during session: Mobility/safety with mobility;Balance OT goals addressed during session: ADL's and self-care;Strengthening/ROM      AM-PAC OT "6 Clicks" Daily Activity  Outcome Measure Help from another person eating meals?: A Little Help from another person taking care of personal grooming?: A Little Help  from another person toileting, which includes using toliet, bedpan, or urinal?: A Lot Help from another person bathing (including washing, rinsing, drying)?: A Lot Help from another person to put on and taking off regular upper body clothing?: A Lot Help from another person to put on and taking off regular lower body clothing?: Total 6 Click Score: 13   End of Session Nurse Communication: Mobility status;Patient requests pain meds  Activity Tolerance: Patient limited by pain Patient left: in bed;with call bell/phone within reach;with family/visitor present  OT Visit Diagnosis: Pain Pain - part of body:  (back)                Time: 1308-6578 OT Time Calculation (min): 29 min Charges:  OT General Charges $OT Visit: 1 Visit OT Evaluation $OT Eval Moderate Complexity: 1 Mod  Eber Jones., OTR/L Acute Rehabilitation Services Pager [phone] Office [phone]   Jeani Hawking M 01/13/2021, 5:43 PM

## 2021-01-13 NOTE — Assessment & Plan Note (Signed)
-   initially: tachycardic, tachypneic, leukocytosis; epidural abscess - see bacteremia and abscess/discitis

## 2021-01-13 NOTE — Progress Notes (Signed)
Physical Therapy Treatment Patient Details Name: Louis [name] MRN: [id number] DOB: [birth year] Today's Date: 01/13/2021    History of Present Illness Pt is a 55 y.o. male who presented 7/13 with worsening back pain with recurrent MRSA bacteremia after discharging 7/3 from being admitted with epidural abscess, vertebral osteomyelitis L3-S1, psoas abscess, and MRSA bacteremia with s/p 12/21/20 T7-8, T12-L1, L3-4 laminectomies for evacuation of epidural abscess. 7/13 mri lumbar/thoracic showed several areas of fluid collection with a more loculated focus at L4-5. 7/20 imaging revealed progressive erosive changes involving the L3 and L4 vertebral bodies  in keeping with progressive changes of discitis osteomyelitis. PMH: CAD, hypertension, uncontrolled diabetes mellitus.    PT Comments    Coordinated with RN for pain meds prior to return for later session. Treated pt in conjunction with OT to try to facilitate increased mobility. Pt was successful in transitioning supine > sit L EOB with HOB elevated and modAx2, but was unable to tolerate sitting on EOB (min guard) for more than a few minutes before the pain resulted in him needing to return to supine. Unable to progress to OOB mobility at this time. He is at risk for falls due to his weakness and pain. Pt may benefit from increasing upright tolerance through use of a Kregg tilt bed. Will continue to follow acutely. Current recommendations remain appropriate.    Follow Up Recommendations  CIR;Supervision/Assistance - 24 hour     Equipment Recommendations  Hospital bed    Recommendations for Other Services Rehab consult     Precautions / Restrictions Precautions Precautions: Back Precaution Booklet Issued: No (provided prior admission) Precaution Comments: will need review Restrictions Weight Bearing Restrictions: No    Mobility  Bed Mobility Overal bed mobility: Needs Assistance Bed Mobility: Supine to Sit;Sit to Supine Rolling: Min  guard Sidelying to sit: Min assist;HOB elevated Supine to sit: Mod assist;+2 for physical assistance;HOB elevated Sit to supine: Mod assist;+2 for physical assistance;HOB elevated   General bed mobility comments: Attempted to transition to sitting L EOB through scooting legs off EOB as pt pivoted on buttocks with HOB elevated, pt needing modAx2 to initiate but able to display good active participation. ModAx2 to manage legs, primarily the L, back onto bed and direct his trunk.    Transfers                 General transfer comment: Deferred due to pain.  Ambulation/Gait             General Gait Details: Deferred due to pain.   Stairs             Wheelchair Mobility    Modified Rankin (Stroke Patients Only) Modified Rankin (Stroke Patients Only) Pre-Morbid Rankin Score: No symptoms Modified Rankin: Severe disability     Balance Overall balance assessment: Needs assistance Sitting-balance support: Bilateral upper extremity supported;Feet supported Sitting balance-Leahy Scale: Poor Sitting balance - Comments: Reliance on UEs to relieve pain, tolerating sitting EOB only a few minutes before requesting to return to supine due to pain. Min guard.       Standing balance comment: Deferred due to pain.                            Cognition Arousal/Alertness: Lethargic Behavior During Therapy: Flat affect Overall Cognitive Status: Impaired/Different from baseline Area of Impairment: Memory                     Memory:  Decreased recall of precautions         General Comments: Cues to maintain spinal precautions with bed mobility. Able to sequence bed mobility, but limited by severe pain in lower back and bil lower extremity. Pt lethargic and in pain, closing eyes often.      Exercises      General Comments General comments (skin integrity, edema, etc.): wife present, HR up to 120s with transition to sit      Pertinent Vitals/Pain  Pain Assessment: Faces Faces Pain Scale: Hurts whole lot Pain Location: bil leg and low back Pain Descriptors / Indicators: Discomfort;Grimacing;Guarding Pain Intervention(s): Limited activity within patient's tolerance;Monitored during session;Premedicated before session;Repositioned    Home Living Family/patient expects to be discharged to:: Private residence Living Arrangements: Spouse/significant other Available Help at Discharge: Family;Available 24 hours/day Type of Home: House Home Access: Stairs to enter Entrance Stairs-Rails: Can reach both Home Layout: One level Home Equipment: Grab bars - tub/shower;Shower seat - built in;Cane - single point;Walker - 2 wheels;Bedside commode;Shower seat;Crutches;Wheelchair - manual      Prior Function Level of Independence: Independent      Comments: works at [employer]. following most recent admission pt was using RW for mobility and needing supervision for mobility.   PT Goals (current goals can now be found in the care plan section) Acute Rehab PT Goals Patient Stated Goal: to reduce pain PT Goal Formulation: With patient/family Time For Goal Achievement: 01/27/21 Potential to Achieve Goals: Good Progress towards PT goals: Progressing toward goals    Frequency    Min 5X/week      PT Plan Equipment recommendations need to be updated    Co-evaluation PT/OT/SLP Co-Evaluation/Treatment: Yes Reason for Co-Treatment: For patient/therapist safety;To address functional/ADL transfers PT goals addressed during session: Mobility/safety with mobility;Balance        AM-PAC PT "6 Clicks" Mobility   Outcome Measure  Help needed turning from your back to your side while in a flat bed without using bedrails?: A Little Help needed moving from lying on your back to sitting on the side of a flat bed without using bedrails?: A Lot Help needed moving to and from a bed to a chair (including a wheelchair)?: Total Help needed standing up  from a chair using your arms (e.g., wheelchair or bedside chair)?: Total Help needed to walk in hospital room?: Total Help needed climbing 3-5 steps with a railing? : Total 6 Click Score: 9    End of Session Equipment Utilized During Treatment: Gait belt Activity Tolerance: Patient limited by pain Patient left: in bed;with call bell/phone within reach;with bed alarm set;with SCD's reapplied;with family/visitor present Nurse Communication: Mobility status;Other (comment) (possible need for Kregg tilt bed) PT Visit Diagnosis: Pain;Muscle weakness (generalized) (M62.81);Difficulty in walking, not elsewhere classified (R26.2) Pain - Right/Left:  (bil) Pain - part of body: Leg (back)     Time: 4944-9675 PT Time Calculation (min) (ACUTE ONLY): 27 min  Charges:  $Therapeutic Activity: 8-22 mins                     Raymond Gurney, PT, DPT Acute Rehabilitation Services  Pager: [phone] Office: [phone]    Jewel Baize 01/13/2021, 3:49 PM

## 2021-01-13 NOTE — Assessment & Plan Note (Signed)
-   s/p treatment  

## 2021-01-13 NOTE — Assessment & Plan Note (Signed)
--   continue protonix  ?

## 2021-01-13 NOTE — Assessment & Plan Note (Addendum)
-  ContinueVenlafaxine XR 75 mg dailyand PRN Xanax

## 2021-01-13 NOTE — Progress Notes (Signed)
   Providing Compassionate, Quality Care - Together  NEUROSURGERY PROGRESS NOTE   S: complains of significant LBP  O: EXAM:  BP 119/72 (BP Location: Right Arm)   Pulse 98   Temp 98.8 F (37.1 C) (Oral)   Resp 14   Wt 94.3 kg   SpO2 92%   BMI 29.01 kg/m   Awake, alert, oriented  Speech fluent, appropriate  CNs grossly intact  MAE equally Incisions c/d/I SILT   ASSESSMENT:  55 y.o. male with   Thoracolumbar epidural abscess, with multilevel osteodiscitis   PLAN: -susc back, abx adjusted per ID -d/w ID and IM about case, no nsx intervention at this time -no drainable fluid collection on CT -Continue aggressive medical care      Thank you for allowing me to participate in this patient's care.  Please do not hesitate to call with questions or concerns.   Monia Pouch, DO Neurosurgeon [facility] Cell: [phone]

## 2021-01-13 NOTE — Assessment & Plan Note (Addendum)
-   s/p nystatin oral suspension x 10 days; may need to be continued if recurs/ongoing - Completed oral fluconazole x 7-day course

## 2021-01-13 NOTE — Progress Notes (Signed)
Progress Note    Louis [name]   RWE:[id number]  DOB: 1965-12-08  DOA: 01/05/2021     7  PCP: Louis Mo, NP  CC: back pain  Hospital Course: Mr. Louis [name] is a 55 year old Caucasian male with PMH significant for but not limited to DMII, HTN, CAD s/p CABG 2017, anxiety, chronic pain as well as other comorbidities with recent admission with an epidural abscess, vertebral osteomyelitis, psoas abscess and MRSA bacteremia who returned to the ED with worsening back pain.    He reported that his back pain has been stable until approximately 5 days PTA when it became more severe and progressively worsened since.  He did not notice any new leg numbness or weakness, but did have some urinary incontinence.  Back pain was reportedly severe and constant; worse with movement and radiating to both legs.   He denied any fevers or chills but stated he did have some mild drainage from his surgical wounds.   He was admitted on 12/12/2020 after found to have an epidural abscess and vertebral osteomyelitis. He underwent a laminectomy and evacuation of abscess on 12/13/2020 and then drainage of the psoas abscess by IR on 12/22/2020.  Blood cultures from 12/13/2020 grew out MRSA but there is no vegetation seen on TTE and blood cultures were drawn and repeated 12/15/2020 remain negative.   He has been on daptomycin at home. On admission, WBC 23.9, PLTC 681k.  COVID PCR was negative.   He had an MRI of the thoracic and lumbar spine which revealed multiple fluid collections involving the ventral epidural abscess at the L4-L5 level with progressive discitis and osteomyelitis at the L4-L5 and L5-S1 levels.  Blood cultures were repeated in the ED.  Initial plan was for lumbar decompression however Dr. Jake [name] of neurosurgery evaluated and felt that his thoracic mass-effect was improved and that even though there was persistent ostial discitis and epidural abscess throughout the thoracolumbar spine there is possibly a more loculated  focus of the abscess at L4-L5 with stenosis but no focal weakness, groin numbness or numbness and tingling in his legs he did not believe that urgent or emergent decompression was needed at that time.   Patient has been confused more often per his wife and neurosurgery is obtaining MRI of the brain with and without contrast given concern of some cerebral spread of infection.  Because he has failed multiple medical treatments will likely need broaden antibiotic therapy and will consult ID and neurosurgery feels that the stenosis L4-L5 could be decompressed however neurologically patient is doing well with no focal deficits and therefore they are recommending aggressive medical and supportive care.   ID changed to IV Vancomycin and consulted IR for drainage of the deep soft tissue collection with peripheral enhancement measuring 4.7 x 5.0 x 1.7 cm.  Dr. Jake [name] of neurosurgery recommending continuing vancomycin and aggressive medical care.   Patient had some nausea and an abnormal EKG so cardiology was consulted for further evaluation and stat troponins were ordered and they were negative.  Cardiology evaluated and felt that we should repeat a limited echo.  Limited echo done and showed no wall motion abnormalities.  There is no evidence of STEMI.   The patient is to undergo a TEE on 01/11/2021.  ID recommending following blood culture susceptibility.  Per ID if WBC remains high and the susceptibility indicates daptomycin remain sensitive they are recommending that we need to discuss with neurosurgery given regarding debridement for source control.  Interval History:  No events  overnight.  Wife present bedside this morning.   Still having back pain as expected; had some abd pain but was relieved after a BM.  Discussed his poor nutritional status and that we'd get the dietician to see him today too.   ROS: Constitutional: negative for chills and fevers, Respiratory: negative for cough and sputum,  Cardiovascular: negative for chest pain, and Gastrointestinal: negative for abdominal pain  Assessment & Plan: Sepsis secondary to epidural abscess Discitis/OM MRSA bacteremia -tachycardic, tachypneic, leukocytosis; epidural abscess - s/p laminectomy & evacuation of abscess 12/21/20 - treated with Dapto; developed worsening back pain; MRI L- and T-spine to have multiple fluid collections including ventral epidural abscess and progressive osteomyelitis at L 4-5 - daptomycin MIC was non-susceptible on lab send off from 7/13; might explain why no clearance of bacteremia vs source control - negative TTE and TEE - persistently positive blood cultures, MRSA (see above) -Neurosurgery also following.  Case rediscussed on 01/12/2021 -Repeat CT abdomen/pelvis being performed on 01/12/2021: no discrete fluid collections - now on dual treatment with vanc and ceftaroline per ID for double coverage; no further plans for surgery still at this time; hopeful that abx will treat infection as a whole  Acute metabolic encephalopathy - improving - patient symptoms include intermittent confusion - etiology considered due to metabolic and infectious etiologies - Mentation has been slowly improving - MRI brain also performed; motion degraded.  Mild generalized atrophy, chronic small vessel disease -Noted that patient was also in significant pain on admission which may have also contributed as well as treatment with pain medications  Protein calorie malnutrition - Patient's BMI is Body mass index is 28.56 kg/m.. - Patient has the following signs/symptoms consistent with PCM: (fat loss, muscle loss, muscle wasting). - seen by RD, appreciate assistance. Continue plan per RD -Prealbumin 5.9 mg/dL  Fall -Despite severe weakness, patient fell out of bed on 01/10/2021 -Repeat head CT showed no acute intracranial abnormalities noted -Fall Precautions -Waist belt for safety   Normocytic Anemia  -Likely  multifactorial in setting of ongoing infection, multiple phlebotomy draws - No overt bleeding  -Checked anemia panel and showed iron level of 19, U IBC 139, TIBC 158, saturation ratios of 12%, ferritin level 864, folate of 2.5, vitamin B12 level 1070 -Continue to monitor for signs and symptoms of bleeding -Continue trending CBC   Type II DM  - A1c was 9.1% in June 2022  - Check CBGs and use SSI for now   CAD status post CABG - No anginal complaints -He had a LIMA to LAD, vein graft to distal circumflex and vein graft RCA in 2017. -His catheterization in March 2020 demonstrated patent vein grafts -Continue aspirin, statin, BB   Abnormal EKG -Patient denied Chest Pain but had Nausea but no vomiting -Had ST Elevation in leads V1-V2 without Reciprocal changes -Troponin Checked and went from 24 -> 31 -Cardiology Consulted and checked Limited ECHO -ECHO showed no WMA -Cardiology does not feel he has a STEMI and thinks that his ST elevations are more pronounced in the setting of pain and hypertension   Hyponatremia  -Appears chronic, possibly related to his SNRI but may also be hypovolemia from infection/fever/insensible losses -Checked urine sodium was 14 and Urine Osm was 484 -BMP daily   Thrombocytosis -Likely reactive in the setting of infection -continue daily CBC    Elevated anion metabolic acidosis - resolved  - s/p treatment    Hypomagnesemia -Replete and recheck as needed   Hypokalemia -Replete and recheck as  needed   Oral Thrush -Start nystatin swish and spit  4 Times Daily and also obtain fluconazole 100 mg Daily for 7 doses   GERD -Continue with Pantoprazole 40 mg p.o. daily  Cholestasis of sepsis -Likely reactive and due to infection/cholestasis    Chronic Diastolic CHF  - monitoring fluid status and renal function   Anxiety  -Continue Venlafaxine XR 25 mg daily and as needed Xanax   Old records reviewed in assessment of this  patient  Antimicrobials: Vancomycin 01/06/2021 >> 01/12/2021 Fluconazole 01/09/2021 >> current Daptomycin Ceftaroline 7/20 >> current Vanc 7/21 >> current  DVT prophylaxis: enoxaparin (LOVENOX) injection 40 mg Start: 01/09/21 1500 SCDs Start: 01/06/21 0224   Code Status:   Code Status: Full Code Family Communication: Wife  Disposition Plan: Status is: Inpatient  Remains inpatient appropriate because:Ongoing active pain requiring inpatient pain management, Ongoing diagnostic testing needed not appropriate for outpatient work up, IV treatments appropriate due to intensity of illness or inability to take PO, and Inpatient level of care appropriate due to severity of illness  Dispo: The patient is from: Home              Anticipated d/c is to: Home              Patient currently is not medically stable to d/c.   Difficult to place patient No  Risk of unplanned readmission score: Unplanned Admission- Pilot do not use: 31.65   Objective: Blood pressure 134/83, pulse 100, temperature 98 F (36.7 C), temperature source Oral, resp. rate 16, weight 92.9 kg, SpO2 94 %.  Examination: General appearance: alert, cooperative, fatigued, and no distress; generalized fat loss noted Head: Normocephalic, without obvious abnormality, atraumatic Eyes:  EOMI Lungs: clear to auscultation bilaterally Heart: regular rate and rhythm and S1, S2 normal Abdomen: normal findings: bowel sounds normal and soft, non-tender Extremities: posterior thigh compartments noted with edema and abdominal wall Skin: mobility and turgor normal Neurologic: Weak throughout (worse in LE) but no focal deficits appreciated  Consultants:  ID Neurosurgery  Cardiology IR  Procedures:  T7-8, T12-L1, L3-L4 laminectomy for Epidural Abscess, 6/28 Left psoas muscle abscess drainage, 6/30 Paraspinal abscess drainage, 7/16  Data Reviewed: I have personally reviewed following labs and imaging studies Results for orders placed  or performed during the hospital encounter of 01/05/21 (from the past 24 hour(s))  Glucose, capillary     Status: Abnormal   Collection Time: 01/12/21  3:21 PM  Result Value Ref Range   Glucose-Capillary 182 (H) 70 - 99 mg/dL  Glucose, capillary     Status: Abnormal   Collection Time: 01/12/21  7:51 PM  Result Value Ref Range   Glucose-Capillary 167 (H) 70 - 99 mg/dL  Glucose, capillary     Status: Abnormal   Collection Time: 01/12/21 11:27 PM  Result Value Ref Range   Glucose-Capillary 133 (H) 70 - 99 mg/dL  Glucose, capillary     Status: Abnormal   Collection Time: 01/13/21  3:40 AM  Result Value Ref Range   Glucose-Capillary 153 (H) 70 - 99 mg/dL  Glucose, capillary     Status: Abnormal   Collection Time: 01/13/21  7:52 AM  Result Value Ref Range   Glucose-Capillary 150 (H) 70 - 99 mg/dL  Basic metabolic panel     Status: Abnormal   Collection Time: 01/13/21  8:46 AM  Result Value Ref Range   Sodium 131 (L) 135 - 145 mmol/L   Potassium 3.9 3.5 - 5.1 mmol/L   Chloride  94 (L) 98 - 111 mmol/L   CO2 25 22 - 32 mmol/L   Glucose, Bld 143 (H) 70 - 99 mg/dL   BUN 5 (L) 6 - 20 mg/dL   Creatinine, Ser 1.61 (L) 0.61 - 1.24 mg/dL   Calcium 8.0 (L) 8.9 - 10.3 mg/dL   GFR, Estimated >09 >60 mL/min   Anion gap 12 5 - 15  CBC with Differential/Platelet     Status: Abnormal   Collection Time: 01/13/21  8:46 AM  Result Value Ref Range   WBC 21.3 (H) 4.0 - 10.5 K/uL   RBC 4.09 (L) 4.22 - 5.81 MIL/uL   Hemoglobin 11.6 (L) 13.0 - 17.0 g/dL   HCT 45.4 (L) 09.8 - 11.9 %   MCV 86.8 80.0 - 100.0 fL   MCH 28.4 26.0 - 34.0 pg   MCHC 32.7 30.0 - 36.0 g/dL   RDW 14.7 (H) 82.9 - 56.2 %   Platelets 626 (H) 150 - 400 K/uL   nRBC 0.0 0.0 - 0.2 %   Neutrophils Relative % 86 %   Neutro Abs 18.2 (H) 1.7 - 7.7 K/uL   Lymphocytes Relative 7 %   Lymphs Abs 1.5 0.7 - 4.0 K/uL   Monocytes Relative 6 %   Monocytes Absolute 1.3 (H) 0.1 - 1.0 K/uL   Eosinophils Relative 0 %   Eosinophils Absolute 0.0  0.0 - 0.5 K/uL   Basophils Relative 0 %   Basophils Absolute 0.0 0.0 - 0.1 K/uL   Immature Granulocytes 1 %   Abs Immature Granulocytes 0.21 (H) 0.00 - 0.07 K/uL  Magnesium     Status: None   Collection Time: 01/13/21  8:46 AM  Result Value Ref Range   Magnesium 1.7 1.7 - 2.4 mg/dL  Prealbumin     Status: Abnormal   Collection Time: 01/13/21  8:46 AM  Result Value Ref Range   Prealbumin 5.9 (L) 18 - 38 mg/dL  Glucose, capillary     Status: Abnormal   Collection Time: 01/13/21 11:38 AM  Result Value Ref Range   Glucose-Capillary 142 (H) 70 - 99 mg/dL    Recent Results (from the past 240 hour(s))  Blood culture (routine x 2)     Status: Abnormal (Preliminary result)   Collection Time: 01/05/21  5:20 PM   Specimen: BLOOD  Result Value Ref Range Status   Specimen Description BLOOD SITE NOT SPECIFIED  Final   Special Requests   Final    BOTTLES DRAWN AEROBIC AND ANAEROBIC Blood Culture adequate volume   Culture  Setup Time   Final    GRAM POSITIVE COCCI IN BOTH AEROBIC AND ANAEROBIC BOTTLES CRITICAL VALUE NOTED.  VALUE IS CONSISTENT WITH PREVIOUSLY REPORTED AND CALLED VALUE.    Culture (A)  Final    METHICILLIN RESISTANT STAPHYLOCOCCUS AUREUS Sent to [facility] for further susceptibility testing. Performed at [facility], [address], [place], [place] [address]    Report Status PENDING  Incomplete   Organism ID, Bacteria METHICILLIN RESISTANT STAPHYLOCOCCUS AUREUS  Final      Susceptibility   Methicillin resistant staphylococcus aureus - MIC*    CIPROFLOXACIN >=8 RESISTANT Resistant     ERYTHROMYCIN >=8 RESISTANT Resistant     GENTAMICIN <=0.5 SENSITIVE Sensitive     OXACILLIN >=4 RESISTANT Resistant     TETRACYCLINE <=1 SENSITIVE Sensitive     VANCOMYCIN 1 SENSITIVE Sensitive     TRIMETH/SULFA >=320 RESISTANT Resistant     CLINDAMYCIN <=0.25 SENSITIVE Sensitive  RIFAMPIN <=0.5 SENSITIVE Sensitive     Inducible Clindamycin NEGATIVE Sensitive     * METHICILLIN  RESISTANT STAPHYLOCOCCUS AUREUS  Blood culture (routine x 2)     Status: Abnormal   Collection Time: 01/05/21  6:10 PM   Specimen: BLOOD  Result Value Ref Range Status   Specimen Description BLOOD SITE NOT SPECIFIED  Final   Special Requests   Final    BOTTLES DRAWN AEROBIC AND ANAEROBIC Blood Culture results may not be optimal due to an inadequate volume of blood received in culture bottles   Culture  Setup Time   Final    GRAM POSITIVE COCCI IN BOTH AEROBIC AND ANAEROBIC BOTTLES CRITICAL VALUE NOTED.  VALUE IS CONSISTENT WITH PREVIOUSLY REPORTED AND CALLED VALUE.    Culture (A)  Final    STAPHYLOCOCCUS AUREUS SUSCEPTIBILITIES PERFORMED ON PREVIOUS CULTURE WITHIN THE LAST 5 DAYS. Performed at [facility], [address] [place], [place] [address]    Report Status 01/08/2021 FINAL  Final  Resp Panel by RT-PCR (Flu A&B, Covid) Nasopharyngeal Swab     Status: None   Collection Time: 01/05/21  9:32 PM   Specimen: Nasopharyngeal Swab; Nasopharyngeal(NP) swabs in vial transport medium  Result Value Ref Range Status   SARS Coronavirus 2 by RT PCR NEGATIVE NEGATIVE Final    Comment: (NOTE) SARS-CoV-2 target nucleic acids are NOT DETECTED.  The SARS-CoV-2 RNA is generally detectable in upper respiratory specimens during the acute phase of infection. The lowest concentration of SARS-CoV-2 viral copies this assay can detect is 138 copies/mL. A negative result does not preclude SARS-Cov-2 infection and should not be used as the sole basis for treatment or other patient management decisions. A negative result may occur with  improper specimen collection/handling, submission of specimen other than nasopharyngeal swab, presence of viral mutation(s) within the areas targeted by this assay, and inadequate number of viral copies(<138 copies/mL). A negative result must be combined with clinical observations, patient history, and epidemiological information. The expected result is  Negative.  Fact Sheet for Patients:  BloggerCourse.com  Fact Sheet for Healthcare Providers:  SeriousBroker.it  This test is no t yet approved or cleared by the [place] FDA and  has been authorized for detection and/or diagnosis of SARS-CoV-2 by FDA under an Emergency Use Authorization (EUA). This EUA will remain  in effect (meaning this test can be used) for the duration of the COVID-19 declaration under Section 564(b)(1) of the Act, 21 U.S.C.section 360bbb-3(b)(1), unless the authorization is terminated  or revoked sooner.       Influenza A by PCR NEGATIVE NEGATIVE Final   Influenza B by PCR NEGATIVE NEGATIVE Final    Comment: (NOTE) The Xpert Xpress SARS-CoV-2/FLU/RSV plus assay is intended as an aid in the diagnosis of influenza from Nasopharyngeal swab specimens and should not be used as a sole basis for treatment. Nasal washings and aspirates are unacceptable for Xpert Xpress SARS-CoV-2/FLU/RSV testing.  Fact Sheet for Patients: BloggerCourse.com  Fact Sheet for Healthcare Providers: SeriousBroker.it  This test is not yet approved or cleared by the [place] FDA and has been authorized for detection and/or diagnosis of SARS-CoV-2 by FDA under an Emergency Use Authorization (EUA). This EUA will remain in effect (meaning this test can be used) for the duration of the COVID-19 declaration under Section 564(b)(1) of the Act, 21 U.S.C. section 360bbb-3(b)(1), unless the authorization is terminated or revoked.  Performed at [facility], [address] [place], [place] [address]  Culture, blood (routine x 2)     Status: Abnormal   Collection Time: 01/07/21  6:52 AM   Specimen: BLOOD  Result Value Ref Range Status   Specimen Description BLOOD RIGHT ANTECUBITAL  Final   Special Requests   Final    BOTTLES DRAWN AEROBIC AND ANAEROBIC Blood Culture adequate  volume   Culture  Setup Time   Final    GRAM POSITIVE COCCI IN CLUSTERS ANAEROBIC BOTTLE ONLY CRITICAL VALUE NOTED.  VALUE IS CONSISTENT WITH PREVIOUSLY REPORTED AND CALLED VALUE.    Culture (A)  Final    STAPHYLOCOCCUS AUREUS SUSCEPTIBILITIES PERFORMED ON PREVIOUS CULTURE WITHIN THE LAST 5 DAYS. Performed at [facility], [address] [place], [place] [address]    Report Status 01/10/2021 FINAL  Final  Culture, blood (routine x 2)     Status: None   Collection Time: 01/07/21  6:52 AM   Specimen: BLOOD RIGHT HAND  Result Value Ref Range Status   Specimen Description BLOOD RIGHT HAND  Final   Special Requests   Final    BOTTLES DRAWN AEROBIC ONLY Blood Culture results may not be optimal due to an inadequate volume of blood received in culture bottles   Culture   Final    NO GROWTH 5 DAYS Performed at [facility], [address] [place], [place] [address]    Report Status 01/12/2021 FINAL  Final  Aerobic/Anaerobic Culture w Gram Stain (surgical/deep wound)     Status: None   Collection Time: 01/08/21 10:10 AM   Specimen: Wound  Result Value Ref Range Status   Specimen Description WOUND  Final   Special Requests NONE  Final   Gram Stain   Final    ABUNDANT WBC PRESENT, PREDOMINANTLY PMN NO ORGANISMS SEEN    Culture   Final    No growth aerobically or anaerobically. Performed at [facility], [address], [place], [place] [address]    Report Status 01/13/2021 FINAL  Final  Culture, blood (Routine X 2) w Reflex to ID Panel     Status: Abnormal   Collection Time: 01/09/21 10:26 AM   Specimen: BLOOD  Result Value Ref Range Status   Specimen Description BLOOD RIGHT ANTECUBITAL  Final   Special Requests   Final    BOTTLES DRAWN AEROBIC AND ANAEROBIC Blood Culture adequate volume   Culture  Setup Time   Final    GRAM POSITIVE COCCI IN CLUSTERS ANAEROBIC BOTTLE ONLY CRITICAL VALUE NOTED.  VALUE IS CONSISTENT WITH PREVIOUSLY REPORTED AND CALLED VALUE.     Culture (A)  Final    STAPHYLOCOCCUS AUREUS SUSCEPTIBILITIES PERFORMED ON PREVIOUS CULTURE WITHIN THE LAST 5 DAYS. Performed at [facility], [address] [place], [place] [address]    Report Status 01/12/2021 FINAL  Final  Culture, blood (Routine X 2) w Reflex to ID Panel     Status: Abnormal   Collection Time: 01/09/21 10:49 AM   Specimen: BLOOD LEFT HAND  Result Value Ref Range Status   Specimen Description BLOOD LEFT HAND  Final   Special Requests   Final    BOTTLES DRAWN AEROBIC AND ANAEROBIC Blood Culture results may not be optimal due to an inadequate volume of blood received in culture bottles   Culture  Setup Time   Final    GRAM POSITIVE COCCI IN CLUSTERS IN BOTH AEROBIC AND ANAEROBIC BOTTLES CRITICAL VALUE NOTED.  VALUE IS CONSISTENT WITH PREVIOUSLY REPORTED AND CALLED VALUE.  Culture (A)  Final    STAPHYLOCOCCUS AUREUS SUSCEPTIBILITIES PERFORMED ON PREVIOUS CULTURE WITHIN THE LAST 5 DAYS. Performed at [facility], [address] [place], [place] [address]    Report Status 01/12/2021 FINAL  Final  Culture, blood (routine x 2)     Status: None (Preliminary result)   Collection Time: 01/11/21  2:02 PM   Specimen: BLOOD RIGHT HAND  Result Value Ref Range Status   Specimen Description BLOOD RIGHT HAND  Final   Special Requests   Final    BOTTLES DRAWN AEROBIC AND ANAEROBIC Blood Culture adequate volume   Culture  Setup Time   Final    GRAM POSITIVE COCCI IN CLUSTERS ANAEROBIC BOTTLE ONLY CRITICAL RESULT CALLED TO, READ BACK BY AND VERIFIED WITH: J. LEDFORD PHARMD, AT 0630 01/12/21 D. VANHOOK    Culture   Final    NO GROWTH < 24 HOURS Performed at [facility], [address], [place], [place] [address]    Report Status PENDING  Incomplete  Culture, blood (routine x 2)     Status: Abnormal (Preliminary result)   Collection Time: 01/11/21  2:03 PM   Specimen: BLOOD LEFT HAND  Result Value Ref Range Status   Specimen Description BLOOD LEFT  HAND  Final   Special Requests   Final    BOTTLES DRAWN AEROBIC AND ANAEROBIC Blood Culture adequate volume   Culture  Setup Time   Final    GRAM POSITIVE COCCI IN CLUSTERS ANAEROBIC BOTTLE ONLY CRITICAL VALUE NOTED.  VALUE IS CONSISTENT WITH PREVIOUSLY REPORTED AND CALLED VALUE.    Culture (A)  Final    STAPHYLOCOCCUS AUREUS SUSCEPTIBILITIES TO FOLLOW Performed at [facility], [address] [place], [place] [address]    Report Status PENDING  Incomplete  Culture, blood (routine x 2)     Status: None (Preliminary result)   Collection Time: 01/12/21 11:47 AM   Specimen: BLOOD RIGHT HAND  Result Value Ref Range Status   Specimen Description BLOOD RIGHT HAND  Final   Special Requests   Final    BOTTLES DRAWN AEROBIC AND ANAEROBIC Blood Culture results may not be optimal due to an inadequate volume of blood received in culture bottles   Culture  Setup Time   Final    GRAM POSITIVE COCCI IN CLUSTERS IN BOTH AEROBIC AND ANAEROBIC BOTTLES CRITICAL VALUE NOTED.  VALUE IS CONSISTENT WITH PREVIOUSLY REPORTED AND CALLED VALUE. Performed at [facility], [address] [place], [place] [address]    Culture GRAM POSITIVE COCCI  Final   Report Status PENDING  Incomplete     Radiology Studies: CT ABDOMEN PELVIS W CONTRAST  Result Date: 01/12/2021 CLINICAL DATA:  Abdominal pain, fever, bacteremia EXAM: CT ABDOMEN AND PELVIS WITH CONTRAST TECHNIQUE: Multidetector CT imaging of the abdomen and pelvis was performed using the standard protocol following bolus administration of intravenous contrast. CONTRAST:  OMNIPAQUE IOHEXOL 300 MG/ML  SOLN COMPARISON:  12/16/2020, lumbar spine MRI 12/12/2020 FINDINGS: Lower chest: Small right and moderate to large left pleural effusions have developed, new since prior examination. There is left lower lobe collapse. Extensive coronary artery calcification noted. Cardiac within normal limits. Hepatobiliary: No focal liver abnormality is seen. No  gallstones, gallbladder wall thickening, or biliary dilatation. Pancreas: Unremarkable Spleen: Unremarkable Adrenals/Urinary Tract: The adrenal glands are unremarkable. The kidneys are normal in size and position. Minimally complex rim calcified exophytic cyst is again seen arising from the lower pole of the left  kidney, better assessed as a Bosniak class 2 cyst on prior MRI examination 12/12/2020. The bladder is unremarkable. Stomach/Bowel: Stomach is within normal limits. Appendix appears normal. No evidence of bowel wall thickening, distention, or inflammatory changes. Mild free fluid has developed within the pelvis. Vascular/Lymphatic: Mild aortoiliac atherosclerotic calcification. No aortic aneurysm. No pathologic adenopathy within the abdomen and pelvis. Reproductive: Prostate is unremarkable. Other: The paraspinal inflammatory collections seen at L3-L5 have enlarged since prior examination. The rim enhancing inflammatory collection measures roughly 3.1 x 4.9 cm on axial image # 56/3 and is now better appreciated on the right measuring roughly 2.3 x 3.5 cm at axial image # 52/3. No discrete drainable fluid is identified. There is interval development of moderate diffuse subcutaneous edema. 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 No pathologic fracture. The spinal canal is not well assessed with this technique. No other lytic or blastic bone lesions are identified. IMPRESSION: Progressive erosive changes involving the L3 and L4 vertebral bodies in keeping with progressive changes of discitis osteomyelitis. No pathologic fracture. Increasing paraspinal inflammatory collections adjacent to L3-L5 bilaterally without discrete drainable fluid at this time. Progressive anasarca with development of  pleural effusions, mild ascites, and moderate diffuse subcutaneous body wall edema. Aortic Atherosclerosis (ICD10-I70.0). Electronically Signed   By: Helyn Numbers MD   On: 01/12/2021 20:12   CT ABDOMEN PELVIS W CONTRAST  Final Result    CT PELVIS W CONTRAST  Final Result    CT HEAD WO CONTRAST  Final Result    MR BRAIN W WO CONTRAST  Final Result    MR Lumbar Spine W Wo Contrast  Final Result    MR THORACIC SPINE W WO CONTRAST  Final Result    [place] FNA SOFT TISSUE    (Results Pending)    Scheduled Meds:  aspirin EC  81 mg Oral Daily   atorvastatin  40 mg Oral QHS   enoxaparin (LOVENOX) injection  40 mg Subcutaneous QHS   feeding supplement  237 mL Oral TID BM   fluconazole  100 mg Oral Daily   furosemide  40 mg Intravenous Daily   gabapentin  200 mg Oral TID   insulin aspart  0-9 Units Subcutaneous Q4H   lactulose  10 g Oral Daily   losartan  25 mg Oral Daily   metoprolol succinate  25 mg Oral Daily   nystatin  5 mL Oral QID   oxyCODONE  20 mg Oral Q12H   pantoprazole  40 mg Oral Daily   polyethylene glycol  17 g Oral BID   venlafaxine XR  75 mg Oral q morning   PRN Meds: acetaminophen **OR** [DISCONTINUED] acetaminophen, ALPRAZolam, alum & mag hydroxide-simeth, cyclobenzaprine, diclofenac Sodium, HYDROmorphone (DILAUDID) injection, labetalol, lidocaine, ondansetron **OR** ondansetron (ZOFRAN) IV Continuous Infusions:  albumin human 25 g (01/13/21 1108)   ceFTAROline (TEFLARO) IV 600 mg (01/13/21 0609)   vancomycin 1,250 mg (01/13/21 1251)     LOS: 7 days  Time spent: Greater than 50% of the 35 minute visit was spent in counseling/coordination of care for the patient as laid out in the A&P.   Lewie Chamber, MD [facility] 01/13/2021, 1:33 PM

## 2021-01-13 NOTE — Assessment & Plan Note (Addendum)
-  Likely multifactorial in setting of ongoing infection, multiple phlebotomy draws -No overt bleeding -Checked anemia panel and showed iron level of 19, U IBC 139, TIBC 158, saturation ratios of 12%, ferritin level 864, folate of 2.5, vitamin B12 level 1070 -Continue to monitor for signs and symptoms of bleeding -Continue trending CBC - Hgb drop: 11.6>>8.6 g/dL on 7/90. Vomitus was bilious when seen; check FOBT. At risk for stress ulcer. Start PPI IV BID. Trend CBC

## 2021-01-13 NOTE — Assessment & Plan Note (Addendum)
-  A1c was 9.1% in June 2022 -Check CBGs and use SSI for now - diet liberalized in setting of malnutrition; may need to adjust insulin if CBGs elevate; CBGs a little bit after initiation of tube feeds.  Continue SSI -Starting Lantus as CBG now uptrending on TF; will adjust as needed

## 2021-01-13 NOTE — Assessment & Plan Note (Addendum)
-   monitoring fluid status and renal function - currently on lasix and albumin  - strict I&O

## 2021-01-13 NOTE — Assessment & Plan Note (Signed)
-   continue Toprol  - BP otherwise has been stable

## 2021-01-13 NOTE — Assessment & Plan Note (Signed)
-  Likely reactive in the setting of infection -continue daily CBC

## 2021-01-13 NOTE — Evaluation (Signed)
Physical Therapy Evaluation Patient Details Name: Louis [name] MRN: [id number] DOB: 1965/06/29 Today's Date: 01/13/2021   History of Present Illness  Pt is a 55 y.o. male who presented 7/13 with worsening back pain with recurrent MRSA bacteremia after discharging 7/3 from being admitted with epidural abscess, vertebral osteomyelitis L3-S1, psoas abscess, and MRSA bacteremia with s/p 12/21/20 T7-8, T12-L1, L3-4 laminectomies for evacuation of epidural abscess. 7/13 mri lumbar/thoracic showed several areas of fluid collection with a more loculated focus at L4-5. 7/20 imaging revealed progressive erosive changes involving the L3 and L4 vertebral bodies  in keeping with progressive changes of discitis osteomyelitis. PMH: CAD, hypertension, uncontrolled diabetes mellitus.   Clinical Impression  Pt presents with condition above and deficits mentioned below, see PT Problem List. Pt was recently independent working at the [employer]. However, following his recent admission he was needing supervision for mobility with a RW. Pt lives with his wife in a 1-level house with 4 STE with bil handrails. Currently, pt demonstrates significant lower back and L leg pain along with generalized weakness that impacts his independence and safety with functional mobility. Pt was only able to tolerate rolling with min guard and displayed good initiation with attempt to sit up, but was unsuccessful due to pain increasing during transition. Despite his severe pain, pt is very motivated to participate and improve. Recommending CIR consult. Will continue to follow acutely.    Follow Up Recommendations CIR;Supervision/Assistance - 24 hour    Equipment Recommendations  None recommended by PT    Recommendations for Other Services Rehab consult     Precautions / Restrictions Precautions Precautions: Back Precaution Booklet Issued: No (provided prior admission) Precaution Comments: recalls 1/3 spinal  precautions Restrictions Weight Bearing Restrictions: No      Mobility  Bed Mobility Overal bed mobility: Needs Assistance Bed Mobility: Rolling;Sidelying to Sit Rolling: Min guard Sidelying to sit: Min assist;HOB elevated       General bed mobility comments: Pt able to log roll towards L with extra time and min guard assist. Pt initiated legs off EOB and transition of trunk to sit up EOB but quickly ceased due to severe lower back and L leg pain, returning self to supine in bed. Pt deferred another attempt at this time.    Transfers                 General transfer comment: Unable to sit up at this time due to pain.  Ambulation/Gait             General Gait Details: Unable to sit up at this time due to pain.  Stairs            Wheelchair Mobility    Modified Rankin (Stroke Patients Only) Modified Rankin (Stroke Patients Only) Pre-Morbid Rankin Score: No symptoms Modified Rankin: Severe disability     Balance                                             Pertinent Vitals/Pain Pain Assessment: Faces Faces Pain Scale: Hurts whole lot Pain Location: L leg and low back Pain Descriptors / Indicators: Discomfort;Grimacing;Guarding Pain Intervention(s): Limited activity within patient's tolerance;Monitored during session;Repositioned;Premedicated before session;Patient requesting pain meds-RN notified    Home Living Family/patient expects to be discharged to:: Private residence Living Arrangements: Spouse/significant other Available Help at Discharge: Family;Available 24 hours/day Type of Home: House Home  Access: Stairs to enter Entrance Stairs-Rails: Can reach both Entrance Stairs-Number of Steps: 4 Home Layout: One level Home Equipment: Grab bars - tub/shower;Shower seat - built in;Cane - single point;Walker - 2 wheels;Bedside commode;Shower seat;Crutches;Wheelchair - manual      Prior Function Level of Independence: Independent          Comments: works at [employer]. following most recent admission pt was using RW for mobility and needing supervision for mobility.     Hand Dominance   Dominant Hand: Right    Extremity/Trunk Assessment   Upper Extremity Assessment Upper Extremity Assessment: Defer to OT evaluation    Lower Extremity Assessment Lower Extremity Assessment: Generalized weakness (bil hip flexion 2+, noted increased weakness grossly in L leg compared to R, possibly due to L leg pain; denies numbness/tingling)    Cervical / Trunk Assessment Cervical / Trunk Assessment: Other exceptions Cervical / Trunk Exceptions: s/p spinal sx and osteomyelitis  Communication   Communication: No difficulties  Cognition Arousal/Alertness: Lethargic Behavior During Therapy: Flat affect Overall Cognitive Status: Impaired/Different from baseline Area of Impairment: Memory                     Memory: Decreased recall of precautions         General Comments: Pt recalling 1/3 spinal precautions. Able to sequence safe bed mobility, but limited by severe pain in L lower extremity. Pt lethargic needing stimulation to awaken repeatedly during session.      General Comments General comments (skin integrity, edema, etc.): wife present    Exercises     Assessment/Plan    PT Assessment Patient needs continued PT services  PT Problem List Decreased strength;Decreased activity tolerance;Decreased mobility;Decreased coordination;Pain;Decreased range of motion;Decreased balance;Decreased knowledge of precautions       PT Treatment Interventions DME instruction;Gait training;Stair training;Functional mobility training;Therapeutic activities;Therapeutic exercise;Balance training;Cognitive remediation;Patient/family education;Neuromuscular re-education    PT Goals (Current goals can be found in the Care Plan section)  Acute Rehab PT Goals Patient Stated Goal: to reduce pain PT Goal Formulation: With  patient/family Time For Goal Achievement: 01/27/21 Potential to Achieve Goals: Good    Frequency Min 5X/week   Barriers to discharge        Co-evaluation               AM-PAC PT "6 Clicks" Mobility  Outcome Measure Help needed turning from your back to your side while in a flat bed without using bedrails?: A Little Help needed moving from lying on your back to sitting on the side of a flat bed without using bedrails?: A Lot Help needed moving to and from a bed to a chair (including a wheelchair)?: Total Help needed standing up from a chair using your arms (e.g., wheelchair or bedside chair)?: Total Help needed to walk in hospital room?: Total Help needed climbing 3-5 steps with a railing? : Total 6 Click Score: 9    End of Session   Activity Tolerance: Patient limited by pain Patient left: in bed;with call bell/phone within reach;with bed alarm set;with SCD's reapplied;with family/visitor present Nurse Communication: Mobility status;Patient requests pain meds (coordinating for return later in afternoon after RN provides more pain meds) PT Visit Diagnosis: Pain;Muscle weakness (generalized) (M62.81);Difficulty in walking, not elsewhere classified (R26.2) Pain - Right/Left: Left Pain - part of body: Leg (back)    Time: 7829-5621 PT Time Calculation (min) (ACUTE ONLY): 33 min   Charges:   PT Evaluation $PT Eval Moderate Complexity: 1 Mod PT Treatments $  Therapeutic Activity: 8-22 mins        Raymond Gurney, PT, DPT Acute Rehabilitation Services  Pager: [phone] Office: [phone]   Jewel Baize 01/13/2021, 1:45 PM

## 2021-01-13 NOTE — Assessment & Plan Note (Signed)
-   s/p laminectomy &evacuation of abscess 12/21/20 - treated with Dapto; developed worsening back pain; MRI L- and T-spine to have multiple fluid collections including ventral epidural abscess and progressive osteomyelitis at L 4-5 - daptomycin MIC was non-susceptible on lab send off from 7/13; might explain why no clearance of bacteremia vs source control - negative TTE and TEE - persistently positive blood cultures, MRSA (see bacteremia) -Neurosurgery also following.  Case rediscussed on 01/12/2021 -Repeat CT abdomen/pelvis being performed on 01/12/2021: no discrete fluid collections - now on dual treatment with vanc and ceftaroline per ID for double coverage; no further plans for surgery still at this time; hopeful that abx will treat infection as a whole

## 2021-01-13 NOTE — Assessment & Plan Note (Addendum)
-   Cholestasis of sepsis -Likely reactive and due to infection - trend CMP

## 2021-01-13 NOTE — Assessment & Plan Note (Signed)
-   patient symptoms include intermittent confusion - etiology considered due to metabolic and infectious etiologies - Mentation has been slowly improving - MRI brain also performed; motion degraded.  Mild generalized atrophy, chronic small vessel disease -Noted that patient was also in significant pain on admission which may have also contributed as well as treatment with pain medications

## 2021-01-14 DIAGNOSIS — A419 Sepsis, unspecified organism: Secondary | ICD-10-CM | POA: Diagnosis not present

## 2021-01-14 DIAGNOSIS — M4620 Osteomyelitis of vertebra, site unspecified: Secondary | ICD-10-CM | POA: Diagnosis not present

## 2021-01-14 DIAGNOSIS — G062 Extradural and subdural abscess, unspecified: Secondary | ICD-10-CM | POA: Diagnosis not present

## 2021-01-14 DIAGNOSIS — R7881 Bacteremia: Secondary | ICD-10-CM | POA: Diagnosis not present

## 2021-01-14 DIAGNOSIS — A4102 Sepsis due to Methicillin resistant Staphylococcus aureus: Principal | ICD-10-CM

## 2021-01-14 LAB — CULTURE, BLOOD (ROUTINE X 2)
Special Requests: ADEQUATE
Special Requests: ADEQUATE

## 2021-01-14 LAB — COMPREHENSIVE METABOLIC PANEL
ALT: 12 U/L (ref 0–44)
AST: 14 U/L — ABNORMAL LOW (ref 15–41)
Albumin: 2.1 g/dL — ABNORMAL LOW (ref 3.5–5.0)
Alkaline Phosphatase: 70 U/L (ref 38–126)
Anion gap: 9 (ref 5–15)
BUN: 6 mg/dL (ref 6–20)
CO2: 29 mmol/L (ref 22–32)
Calcium: 7.9 mg/dL — ABNORMAL LOW (ref 8.9–10.3)
Chloride: 97 mmol/L — ABNORMAL LOW (ref 98–111)
Creatinine, Ser: 0.5 mg/dL — ABNORMAL LOW (ref 0.61–1.24)
GFR, Estimated: >60 mL/min (ref >60)
Glucose, Bld: 155 mg/dL — ABNORMAL HIGH (ref 70–99)
Potassium: 3.3 mmol/L — ABNORMAL LOW (ref 3.5–5.1)
Sodium: 135 mmol/L (ref 135–145)
Total Bilirubin: 0.7 mg/dL (ref 0.3–1.2)
Total Protein: 5.1 g/dL — ABNORMAL LOW (ref 6.5–8.1)

## 2021-01-14 LAB — CBC WITH DIFFERENTIAL/PLATELET
Abs Immature Granulocytes: 0.09 10*3/uL — ABNORMAL HIGH (ref 0.00–0.07)
Basophils Absolute: 0 10*3/uL (ref 0.0–0.1)
Basophils Relative: 0 %
Eosinophils Absolute: 0.1 10*3/uL (ref 0.0–0.5)
Eosinophils Relative: 1 %
HCT: 26.5 % — ABNORMAL LOW (ref 39.0–52.0)
Hemoglobin: 8.6 g/dL — ABNORMAL LOW (ref 13.0–17.0)
Immature Granulocytes: 1 %
Lymphocytes Relative: 10 %
Lymphs Abs: 1.3 10*3/uL (ref 0.7–4.0)
MCH: 28.5 pg (ref 26.0–34.0)
MCHC: 32.5 g/dL (ref 30.0–36.0)
MCV: 87.7 fL (ref 80.0–100.0)
Monocytes Absolute: 1 10*3/uL (ref 0.1–1.0)
Monocytes Relative: 9 %
Neutro Abs: 9.7 10*3/uL — ABNORMAL HIGH (ref 1.7–7.7)
Neutrophils Relative %: 79 %
Platelets: 428 10*3/uL — ABNORMAL HIGH (ref 150–400)
RBC: 3.02 MIL/uL — ABNORMAL LOW (ref 4.22–5.81)
RDW: 15.7 % — ABNORMAL HIGH (ref 11.5–15.5)
WBC: 12.2 10*3/uL — ABNORMAL HIGH (ref 4.0–10.5)
nRBC: 0 % (ref 0.0–0.2)

## 2021-01-14 LAB — GLUCOSE, CAPILLARY
Glucose-Capillary: 157 mg/dL — ABNORMAL HIGH (ref 70–99)
Glucose-Capillary: 166 mg/dL — ABNORMAL HIGH (ref 70–99)
Glucose-Capillary: 173 mg/dL — ABNORMAL HIGH (ref 70–99)
Glucose-Capillary: 192 mg/dL — ABNORMAL HIGH (ref 70–99)
Glucose-Capillary: 198 mg/dL — ABNORMAL HIGH (ref 70–99)
Glucose-Capillary: 219 mg/dL — ABNORMAL HIGH (ref 70–99)

## 2021-01-14 LAB — MAGNESIUM: Magnesium: 1.7 mg/dL (ref 1.7–2.4)

## 2021-01-14 LAB — PHOSPHORUS: Phosphorus: 3.5 mg/dL (ref 2.5–4.6)

## 2021-01-14 MED ORDER — POTASSIUM CHLORIDE CRYS ER 20 MEQ PO TBCR
40.0000 meq | EXTENDED_RELEASE_TABLET | Freq: Once | ORAL | Status: DC
  Filled 2021-01-14: qty 2

## 2021-01-14 MED ORDER — PANTOPRAZOLE SODIUM 40 MG IV SOLR
40.0000 mg | Freq: Two times a day (BID) | INTRAVENOUS | Status: DC
  Administered 2021-01-14 – 2021-01-23 (×17): 40 mg via INTRAVENOUS
  Filled 2021-01-14 (×20): qty 40

## 2021-01-14 MED ORDER — MAGNESIUM OXIDE -MG SUPPLEMENT 400 (240 MG) MG PO TABS
800.0000 mg | ORAL_TABLET | Freq: Once | ORAL | Status: DC
  Filled 2021-01-14: qty 2

## 2021-01-14 MED ORDER — SODIUM CHLORIDE 0.9 % IV SOLN
12.5000 mg | Freq: Four times a day (QID) | INTRAVENOUS | Status: DC | PRN
  Administered 2021-01-14 – 2021-01-15 (×2): 12.5 mg via INTRAVENOUS
  Filled 2021-01-14 (×2): qty 0.5

## 2021-01-14 NOTE — Progress Notes (Signed)
Pharmacist Acute Kidney Injury Risk Reduction Review  This patient has osteomylitis/discitis with persistently positive blood cultures requiring high doses of vancomycin. This patient also takes baby aspirin and losartan for a PMH of CAD/CHF. The combination of these agents leads to increased risk of AKI.   The following adjustment(s) were made to associated medication orders:  Medication discontinued  Details of intervention(s):  Losartan was discontinued while inpatient as the patient's BP was soft.   Provider contacted: Dr. Lewie Chamber, MD    Jani Gravel, PharmD PGY-1 Acute Care Resident  01/14/2021 10:22 AM

## 2021-01-14 NOTE — Progress Notes (Signed)
[facility]  Date of Admission:  01/05/2021     CC: Mrsa bacteremia  Lines: Peripheral iv's  Abx: 7/20-c ceftaroline 7/14-20; 7/21-c vanc  7/20 dapto  Previously daptomycin  ASSESSMENT: 55 yo male recent mrsa bacteremia/epidural abscess/OM and psoas abscess s/p open debridement 6/28 and IR drainage of left psoas discharged on daptomycin, now readmitted 7/13 for worsening back pain of a few days found to have recurrent mrsa bacteremia  7/13 & 7/15 & 7/17 & 7/19 & 7/20 bcx mrsa   7/13 mri lumbar/thoracic showed several areas of fluid collection with a more loculated focus at L4-5. There is no focal LE neurologic deficit or urinary/bowel incontinence. NSG reviewed case and felt no urgent decompression at this time  7/16 IR reviewed case and percutaneously drained the 5 cm L3 level subdural fluid collection.  7/17 tte no vegetation 7/19 tee no vegetation 7/20 abd/pelv ct with contrast no occult abscess  Wbc improving on vanc, but persistent bacteremia switched to combination abx therapy 7/20 dapto/ceftaroline --> vanc/ceftaroline on 7/21 (dapto resistant on sensitivity testing 7/13)  Discussed with dr Dawley of nsg on 7/21. Based on ct 7/20 and repeat mri spine no actual target to debride  --------------- 7/22 assessment Stable moderate back pain Afebrile/wbc improving still Vomiting/nausea ?abx vs pain  7/20 bcx staph aureus again. Hopefully combination abx can help this issue. I will give him until 7/24 to repeat bcx and if that is positive still will need to repeat source w/u including another TEE   PLAN: Continue vancomycin/ceftaroline Repeat bcx on 7/24 F/u repeat bcx 7/22 If 7/24 bcx positive will need repeat tee plus/minus other source w/u Appreciate hospitalist & nsg team caring for patient Discussed with hospitalist/nsg/id pharmacy  I spent more than 35 minute reviewing data/chart, and coordinating care and >50% direct  face to face time providing counseling/discussing diagnostics/treatment plan with patient     Active Problems:   CAD (coronary artery disease)   HTN (hypertension)   DMII (diabetes mellitus, type 2) (HCC)   Vertebral osteomyelitis (HCC)   MRSA bacteremia   Epidural abscess   Normocytic anemia   Hyponatremia   Anxiety   Chronic diastolic CHF (congestive heart failure) (HCC)   Protein-calorie malnutrition, severe   Discitis   Abnormal EKG   Thrombocytosis   Hypomagnesemia   Hypokalemia   Oral thrush   GERD (gastroesophageal reflux disease)   Cholestasis   No Known Allergies  Scheduled Meds:  aspirin EC  81 mg Oral Daily   atorvastatin  40 mg Oral QHS   enoxaparin (LOVENOX) injection  40 mg Subcutaneous QHS   feeding supplement  237 mL Oral TID BM   fluconazole  100 mg Oral Daily   furosemide  40 mg Intravenous Daily   gabapentin  200 mg Oral TID   insulin aspart  0-9 Units Subcutaneous Q4H   lactulose  10 g Oral Daily   magnesium oxide  800 mg Oral Once   mouth rinse  15 mL Mouth Rinse BID   metoprolol succinate  25 mg Oral Daily   multivitamin with minerals  1 tablet Oral Daily   nystatin  5 mL Oral QID   oxyCODONE  20 mg Oral Q12H   pantoprazole (PROTONIX) IV  40 mg Intravenous BID   polyethylene glycol  17 g Oral BID   potassium chloride  40 mEq Oral Once   venlafaxine XR  75 mg Oral q morning   Continuous  Infusions:  albumin human 25 g (01/14/21 0626)   ceFTAROline (TEFLARO) IV 600 mg (01/14/21 0547)   promethazine (PHENERGAN) injection (IM or IVPB)     vancomycin 1,250 mg (01/14/21 0344)   PRN Meds:.acetaminophen **OR** [DISCONTINUED] acetaminophen, ALPRAZolam, alum & mag hydroxide-simeth, cyclobenzaprine, diclofenac Sodium, HYDROmorphone (DILAUDID) injection, labetalol, lidocaine, ondansetron **OR** ondansetron (ZOFRAN) IV, promethazine (PHENERGAN) injection (IM or IVPB)   SUBJECTIVE: No f/c Wbc improving Vomiting today; intermittent nausea Back pain  moderate/severe No rash/diarrhea No new site of pain No numbness/tingling/weakness in le or bowel/bladder incontinence  Review of Systems: ROS All other ROS was negative, except mentioned above     OBJECTIVE: Vitals:   01/13/21 2345 01/14/21 0349 01/14/21 0736 01/14/21 1119  BP: (!) 87/58 97/66 120/71 (!) 131/97  Pulse: 98 83 87 96  Resp: Temp: 98.2 F (36.8 C) 98.6 F (37 C) 97.6 F (36.4 C) 97.6 F (36.4 C)  TempSrc: Axillary Axillary Axillary Oral  SpO2: 97% 97% 96% 100%  Weight:       Body mass index is 29.01 kg/m.  Physical Exam General/constitutional: mild distress from vomitting, pleasant/cooperative HEENT: Normocephalic, PER, Conj Clear, EOMI, Oropharynx clear Neck supple CV: rrr no mrg Lungs: clear to auscultation, normal respiratory effort Abd: Soft, Nontender Ext: no edema Skin: No Rash Neuro: nonfocal MSK: no peripheral joint swelling/tenderness/warmth; back spines nontender   Central line presence: no   Lab Results Lab Results  Component Value Date   WBC 12.2 (H) 01/14/2021   HGB 8.6 (L) 01/14/2021   HCT 26.5 (L) 01/14/2021   MCV 87.7 01/14/2021   PLT 428 (H) 01/14/2021    Lab Results  Component Value Date   CREATININE 0.50 (L) 01/14/2021   BUN 6 01/14/2021   NA 135 01/14/2021   K 3.3 (L) 01/14/2021   CL 97 (L) 01/14/2021   CO2 29 01/14/2021    Lab Results  Component Value Date   ALT 12 01/14/2021   AST 14 (L) 01/14/2021   ALKPHOS 70 01/14/2021   BILITOT 0.7 01/14/2021      Microbiology: Recent Results (from the past 240 hour(s))  Blood culture (routine x 2)     Status: Abnormal   Collection Time: 01/05/21  5:20 PM   Specimen: BLOOD  Result Value Ref Range Status   Specimen Description BLOOD SITE NOT SPECIFIED  Final   Special Requests   Final    BOTTLES DRAWN AEROBIC AND ANAEROBIC Blood Culture adequate volume   Culture  Setup Time   Final    GRAM POSITIVE COCCI IN BOTH AEROBIC AND ANAEROBIC  BOTTLES CRITICAL VALUE NOTED.  VALUE IS CONSISTENT WITH PREVIOUSLY REPORTED AND CALLED VALUE.    Culture (A)  Final    METHICILLIN RESISTANT STAPHYLOCOCCUS AUREUS SEE SEPARATE REPORT Performed at [facility], [address] [place], [place] [address]    Report Status 01/13/2021 FINAL  Final   Organism ID, Bacteria METHICILLIN RESISTANT STAPHYLOCOCCUS AUREUS  Final      Susceptibility   Methicillin resistant staphylococcus aureus - MIC*    CIPROFLOXACIN >=8 RESISTANT Resistant     ERYTHROMYCIN >=8 RESISTANT Resistant     GENTAMICIN <=0.5 SENSITIVE Sensitive     OXACILLIN >=4 RESISTANT Resistant     TETRACYCLINE <=1 SENSITIVE Sensitive     VANCOMYCIN 1 SENSITIVE Sensitive     TRIMETH/SULFA >=320 RESISTANT Resistant     CLINDAMYCIN <=0.25 SENSITIVE Sensitive     RIFAMPIN <=0.5 SENSITIVE Sensitive     Inducible  Clindamycin NEGATIVE Sensitive     * METHICILLIN RESISTANT STAPHYLOCOCCUS AUREUS  Blood culture (routine x 2)     Status: Abnormal   Collection Time: 01/05/21  6:10 PM   Specimen: BLOOD  Result Value Ref Range Status   Specimen Description BLOOD SITE NOT SPECIFIED  Final   Special Requests   Final    BOTTLES DRAWN AEROBIC AND ANAEROBIC Blood Culture results may not be optimal due to an inadequate volume of blood received in culture bottles   Culture  Setup Time   Final    GRAM POSITIVE COCCI IN BOTH AEROBIC AND ANAEROBIC BOTTLES CRITICAL VALUE NOTED.  VALUE IS CONSISTENT WITH PREVIOUSLY REPORTED AND CALLED VALUE.    Culture (A)  Final    STAPHYLOCOCCUS AUREUS SUSCEPTIBILITIES PERFORMED ON PREVIOUS CULTURE WITHIN THE LAST 5 DAYS. Performed at [facility], [address] [place], [place] [address]    Report Status 01/08/2021 FINAL  Final  Resp Panel by RT-PCR (Flu A&B, Covid) Nasopharyngeal Swab     Status: None   Collection Time: 01/05/21  9:32 PM   Specimen: Nasopharyngeal Swab; Nasopharyngeal(NP) swabs in vial transport medium  Result Value Ref Range  Status   SARS Coronavirus 2 by RT PCR NEGATIVE NEGATIVE Final    Comment: (NOTE) SARS-CoV-2 target nucleic acids are NOT DETECTED.  The SARS-CoV-2 RNA is generally detectable in upper respiratory specimens during the acute phase of infection. The lowest concentration of SARS-CoV-2 viral copies this assay can detect is 138 copies/mL. A negative result does not preclude SARS-Cov-2 infection and should not be used as the sole basis for treatment or other patient management decisions. A negative result may occur with  improper specimen collection/handling, submission of specimen other than nasopharyngeal swab, presence of viral mutation(s) within the areas targeted by this assay, and inadequate number of viral copies(<138 copies/mL). A negative result must be combined with clinical observations, patient history, and epidemiological information. The expected result is Negative.  Fact Sheet for Patients:  BloggerCourse.comhttps://www.fda.gov/media/152166/download  Fact Sheet for Healthcare Providers:  SeriousBroker.ithttps://www.fda.gov/media/152162/download  This test is no t yet approved or cleared by the [place] FDA and  has been authorized for detection and/or diagnosis of SARS-CoV-2 by FDA under an Emergency Use Authorization (EUA). This EUA will remain  in effect (meaning this test can be used) for the duration of the COVID-19 declaration under Section 564(b)(1) of the Act, 21 U.S.C.section 360bbb-3(b)(1), unless the authorization is terminated  or revoked sooner.       Influenza A by PCR NEGATIVE NEGATIVE Final   Influenza B by PCR NEGATIVE NEGATIVE Final    Comment: (NOTE) The Xpert Xpress SARS-CoV-2/FLU/RSV plus assay is intended as an aid in the diagnosis of influenza from Nasopharyngeal swab specimens and should not be used as a sole basis for treatment. Nasal washings and aspirates are unacceptable for Xpert Xpress SARS-CoV-2/FLU/RSV testing.  Fact Sheet for  Patients: BloggerCourse.comhttps://www.fda.gov/media/152166/download  Fact Sheet for Healthcare Providers: SeriousBroker.ithttps://www.fda.gov/media/152162/download  This test is not yet approved or cleared by the [place] FDA and has been authorized for detection and/or diagnosis of SARS-CoV-2 by FDA under an Emergency Use Authorization (EUA). This EUA will remain in effect (meaning this test can be used) for the duration of the COVID-19 declaration under Section 564(b)(1) of the Act, 21 U.S.C. section 360bbb-3(b)(1), unless the authorization is terminated or revoked.  Performed at [facility], [address] [place], [place] [address]   Culture, blood (routine x 2)  Status: Abnormal   Collection Time: 01/07/21  6:52 AM   Specimen: BLOOD  Result Value Ref Range Status   Specimen Description BLOOD RIGHT ANTECUBITAL  Final   Special Requests   Final    BOTTLES DRAWN AEROBIC AND ANAEROBIC Blood Culture adequate volume   Culture  Setup Time   Final    GRAM POSITIVE COCCI IN CLUSTERS ANAEROBIC BOTTLE ONLY CRITICAL VALUE NOTED.  VALUE IS CONSISTENT WITH PREVIOUSLY REPORTED AND CALLED VALUE.    Culture (A)  Final    STAPHYLOCOCCUS AUREUS SUSCEPTIBILITIES PERFORMED ON PREVIOUS CULTURE WITHIN THE LAST 5 DAYS. Performed at [facility], [address] [place], [place] [address]    Report Status 01/10/2021 FINAL  Final  Culture, blood (routine x 2)     Status: None   Collection Time: 01/07/21  6:52 AM   Specimen: BLOOD RIGHT HAND  Result Value Ref Range Status   Specimen Description BLOOD RIGHT HAND  Final   Special Requests   Final    BOTTLES DRAWN AEROBIC ONLY Blood Culture results may not be optimal due to an inadequate volume of blood received in culture bottles   Culture   Final    NO GROWTH 5 DAYS Performed at [facility], [address] [place], [place] [address]    Report Status 01/12/2021 FINAL  Final  Aerobic/Anaerobic Culture w Gram Stain (surgical/deep wound)      Status: None   Collection Time: 01/08/21 10:10 AM   Specimen: Wound  Result Value Ref Range Status   Specimen Description WOUND  Final   Special Requests NONE  Final   Gram Stain   Final    ABUNDANT WBC PRESENT, PREDOMINANTLY PMN NO ORGANISMS SEEN    Culture   Final    No growth aerobically or anaerobically. Performed at [facility], [address], [place], [place] [address]    Report Status 01/13/2021 FINAL  Final  Culture, blood (Routine X 2) w Reflex to ID Panel     Status: Abnormal   Collection Time: 01/09/21 10:26 AM   Specimen: BLOOD  Result Value Ref Range Status   Specimen Description BLOOD RIGHT ANTECUBITAL  Final   Special Requests   Final    BOTTLES DRAWN AEROBIC AND ANAEROBIC Blood Culture adequate volume   Culture  Setup Time   Final    GRAM POSITIVE COCCI IN CLUSTERS ANAEROBIC BOTTLE ONLY CRITICAL VALUE NOTED.  VALUE IS CONSISTENT WITH PREVIOUSLY REPORTED AND CALLED VALUE.    Culture (A)  Final    STAPHYLOCOCCUS AUREUS SUSCEPTIBILITIES PERFORMED ON PREVIOUS CULTURE WITHIN THE LAST 5 DAYS. Performed at [facility], [address], [place], [place] [address]    Report Status 01/12/2021 FINAL  Final  Culture, blood (Routine X 2) w Reflex to ID Panel     Status: Abnormal   Collection Time: 01/09/21 10:49 AM   Specimen: BLOOD LEFT HAND  Result Value Ref Range Status   Specimen Description BLOOD LEFT HAND  Final   Special Requests   Final    BOTTLES DRAWN AEROBIC AND ANAEROBIC Blood Culture results may not be optimal due to an inadequate volume of blood received in culture bottles   Culture  Setup Time   Final    GRAM POSITIVE COCCI IN CLUSTERS IN BOTH AEROBIC AND ANAEROBIC BOTTLES CRITICAL VALUE NOTED.  VALUE IS CONSISTENT WITH PREVIOUSLY REPORTED AND CALLED VALUE.    Culture (A)  Final    STAPHYLOCOCCUS AUREUS SUSCEPTIBILITIES  PERFORMED ON PREVIOUS CULTURE WITHIN THE LAST 5 DAYS. Performed at [facility], [address],  [place], [place] [address]    Report Status 01/12/2021 FINAL  Final  Culture, blood (routine x 2)     Status: Abnormal   Collection Time: 01/11/21  2:02 PM   Specimen: BLOOD RIGHT HAND  Result Value Ref Range Status   Specimen Description BLOOD RIGHT HAND  Final   Special Requests   Final    BOTTLES DRAWN AEROBIC AND ANAEROBIC Blood Culture adequate volume   Culture  Setup Time   Final    GRAM POSITIVE COCCI IN CLUSTERS IN BOTH AEROBIC AND ANAEROBIC BOTTLES CRITICAL RESULT CALLED TO, READ BACK BY AND VERIFIED WITH: J. LEDFORD PHARMD, AT 0630 01/12/21 D. VANHOOK    Culture (A)  Final    STAPHYLOCOCCUS AUREUS SUSCEPTIBILITIES PERFORMED ON PREVIOUS CULTURE WITHIN THE LAST 5 DAYS. Performed at [facility], [address], [place], [place] [address]    Report Status 01/14/2021 FINAL  Final  Culture, blood (routine x 2)     Status: Abnormal   Collection Time: 01/11/21  2:03 PM   Specimen: BLOOD LEFT HAND  Result Value Ref Range Status   Specimen Description BLOOD LEFT HAND  Final   Special Requests   Final    BOTTLES DRAWN AEROBIC AND ANAEROBIC Blood Culture adequate volume   Culture  Setup Time   Final    GRAM POSITIVE COCCI IN CLUSTERS ANAEROBIC BOTTLE ONLY CRITICAL VALUE NOTED.  VALUE IS CONSISTENT WITH PREVIOUSLY REPORTED AND CALLED VALUE. Performed at [facility], [address] [place], [place] [address]    Culture METHICILLIN RESISTANT STAPHYLOCOCCUS AUREUS (A)  Final   Report Status 01/14/2021 FINAL  Final   Organism ID, Bacteria METHICILLIN RESISTANT STAPHYLOCOCCUS AUREUS  Final      Susceptibility   Methicillin resistant staphylococcus aureus - MIC*    CIPROFLOXACIN >=8 RESISTANT Resistant     ERYTHROMYCIN >=8 RESISTANT Resistant     GENTAMICIN <=0.5 SENSITIVE Sensitive     OXACILLIN >=4 RESISTANT Resistant     TETRACYCLINE <=1 SENSITIVE Sensitive     VANCOMYCIN <=0.5 SENSITIVE Sensitive     TRIMETH/SULFA >=320 RESISTANT Resistant     CLINDAMYCIN <=0.25  SENSITIVE Sensitive     RIFAMPIN <=0.5 SENSITIVE Sensitive     Inducible Clindamycin NEGATIVE Sensitive     * METHICILLIN RESISTANT STAPHYLOCOCCUS AUREUS  Culture, blood (routine x 2)     Status: Abnormal (Preliminary result)   Collection Time: 01/12/21 11:47 AM   Specimen: BLOOD RIGHT HAND  Result Value Ref Range Status   Specimen Description BLOOD RIGHT HAND  Final   Special Requests   Final    BOTTLES DRAWN AEROBIC AND ANAEROBIC Blood Culture results may not be optimal due to an inadequate volume of blood received in culture bottles   Culture  Setup Time   Final    GRAM POSITIVE COCCI IN CLUSTERS IN BOTH AEROBIC AND ANAEROBIC BOTTLES CRITICAL VALUE NOTED.  VALUE IS CONSISTENT WITH PREVIOUSLY REPORTED AND CALLED VALUE.    Culture (A)  Final    STAPHYLOCOCCUS AUREUS SUSCEPTIBILITIES PERFORMED ON PREVIOUS CULTURE WITHIN THE LAST 5 DAYS. Performed at [facility], [address], [place], [place] [address]    Report Status PENDING  Incomplete  Culture, blood (routine x 2)     Status: None (Preliminary result)   Collection Time: 01/12/21 11:52 AM   Specimen: BLOOD LEFT HAND  Result Value  Ref Range Status   Specimen Description BLOOD LEFT HAND  Final   Special Requests   Final    BOTTLES DRAWN AEROBIC ONLY Blood Culture adequate volume   Culture   Final    NO GROWTH 2 DAYS Performed at [facility], [address] [place], [place] [address]    Report Status PENDING  Incomplete     Serology:   Imaging: If present, new imagings (plain films, ct scans, and mri) have been personally visualized and interpreted; radiology reports have been reviewed. Decision making incorporated into the Impression / Recommendations.  7/13 mri thoracic lumbar Paraspinal and other soft tissues: Patient is status post left laminectomy at L3. Fluid tracks from the laminectomy site into a subcutaneous and deep soft tissue collection with peripheral enhancement measuring 4.7 x 5.0 x 1.7  cm. The ventral epidural collection extends inferiorly to the L2 level. Separate prominent ventral collection is present posterior to L4 prominently right the left. This collection measures 2.9 x 1.3 cm on the sagittal images.   Disc levels:   In addition to the epidural collections, disc disease is again noted at L4-5 and L5-S1. Central foraminal narrowing are associated.   IMPRESSION: 1. T7 laminectomy subcutaneous peripherally enhancing fluid collection at this level as well. 2. Residual posterior epidural fluid collection in the thoracic spine with similar cephalo caudad extension from T5-6-T9 but less mass effect. 3. Progressive signal abnormality and enhancement throughout the T7 and T8 vertebral bodies in the T7-8 thoracic disc consistent with disc osteomyelitis. 4. Progressive paraspinous soft tissue enhancement at T3-4 through T9-10 without drainable collection. 5. Persistent extensive ventral epidural collection compatible with abscess. 6. Status post left laminectomy at L3 with peripherally enhancing fluid collection extending from the operative site into the subcutaneous and deep tissues as described. This is concerning abscess. 7. Fluid tracks from the laminectomy site into a subdural deep soft tissue collection measuring 4.7 x 5.0 x 1.7 cm. 8. Separate ventral epidural collection at L4-5 and L5-S1 consistent with epidural abscess. 9. Progressive signal abnormality and enhancement in the L3 and L4 vertebral bodies. 10. 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   7/17 tte  1. Left ventricular ejection fraction, by estimation, is 55 to 60%. The  left ventricle has normal function. The left ventricle has no regional  wall motion abnormalities. There is mild concentric left ventricular  hypertrophy.   2. Right  ventricular systolic function is normal. The right ventricular  size is normal.   3. The mitral valve is grossly normal. Trivial mitral valve  regurgitation. No evidence of mitral stenosis.   4. The aortic valve is grossly normal. Aortic valve regurgitation is not  visualized. No aortic stenosis is present.   5. The inferior vena cava is normal in size with greater than 50%  respiratory variability, suggesting right atrial pressure of 3 mmHg.   7/19 tee No valvular vegetation  7/20 ct abd pelv with contrast Progressive erosive changes involving the L3 and L4 vertebral bodies in keeping with progressive changes of discitis osteomyelitis. No pathologic fracture. Increasing paraspinal inflammatory collections adjacent to L3-L5 bilaterally without discrete drainable fluid at this time.   Progressive anasarca with development of pleural effusions, mild ascites, and moderate diffuse subcutaneous body wall edema.   Aortic Atherosclerosis    Raymondo Band, MD [facility]  [phone] pager    01/14/2021, 12:25 PM

## 2021-01-14 NOTE — Progress Notes (Addendum)
Calorie Count Note  24-hour calorie count ordered. Calorie count started at lunch meal on 7/21.  Diet: regular, thin liquids Supplements:  - Ensure Enlive TID  6/21 Lunch: 0 kcal, 0 grams of protein 6/21 Dinner: 50 kcal, 0 grams of protein 6/22 Breakfast: 0 kcal, 0 grams of protein Supplements: 350 kcal, 20 grams of protein  Total 24-hour intake: 400 kcal (17% of minimum estimated needs)  20 grams of protein (17% of minimum estimated needs)  Pt continues with very poor PO intake. Per RN, pt now vomiting.  Nutrition Diagnosis: Severe Malnutrition related to acute illness (MRSA bacteremia) as evidenced by mild fat depletion, moderate muscle depletion, energy intake < or equal to 50% for > or equal to 5 days.  Goal: Patient will meet greater than or equal to 90% of their needs.  Intervention:  - d/c calorie count - Continue Ensure Enlive TID - Will discuss next steps with MD  Addendum (1450): Discussed with MD. No plan for Cortrak placement today related to ongoing N/V.   Mertie Clause, MS, RD, LDN Inpatient Clinical Dietitian Please see AMiON for contact information.

## 2021-01-14 NOTE — Progress Notes (Signed)
Physical Therapy Treatment Patient Details Name: Louis [name] MRN: [id number] DOB: 03/10/1966 Today's Date: 01/14/2021    History of Present Illness Pt is a 55 y.o. male who presented 7/13 with worsening back pain with recurrent MRSA bacteremia after discharging 7/3 from being admitted with epidural abscess, vertebral osteomyelitis L3-S1, psoas abscess, and MRSA bacteremia with s/p 12/21/20 T7-8, T12-L1, L3-4 laminectomies for evacuation of epidural abscess. 7/13 mri lumbar/thoracic showed several areas of fluid collection with a more loculated focus at L4-5. 7/20 imaging revealed progressive erosive changes involving the L3 and L4 vertebral bodies  in keeping with progressive changes of discitis osteomyelitis. PMH: CAD, hypertension, uncontrolled diabetes mellitus.    PT Comments    Pt reporting severe pain in bil shoulders, bil lower extremities, lower back, and head today with bouts of emesis during day, thus pt requesting to not perform bed mobility or get OOB this date. Pt agreeable to attempting exercises to prevent muscle atrophy and improve generalized strength for future mobility. However, pt lethargic, falling asleep often, and needing extensive periods of time to complete each rep. Provided HEP handout with education to perform these exercises throughout weekend. Will continue to follow acutely. Hopefully pt will begin to progress well soon and be appropriate for CIR.    Follow Up Recommendations  CIR;Supervision/Assistance - 24 hour     Equipment Recommendations  Hospital bed    Recommendations for Other Services Rehab consult     Precautions / Restrictions Precautions Precautions: Back Precaution Booklet Issued: No (provided prior admission) Precaution Comments: Will need to review precautions Restrictions Weight Bearing Restrictions: No    Mobility  Bed Mobility               General bed mobility comments: Pt deferred bed mobility this date due to severe pain and  bouts of emesis.    Transfers                 General transfer comment: Deferred due to pain.  Ambulation/Gait             General Gait Details: Deferred due to pain.   Stairs             Wheelchair Mobility    Modified Rankin (Stroke Patients Only) Modified Rankin (Stroke Patients Only) Pre-Morbid Rankin Score: No symptoms Modified Rankin: Severe disability     Balance Overall balance assessment: Needs assistance                                          Cognition Arousal/Alertness: Lethargic Behavior During Therapy: Flat affect Overall Cognitive Status: Impaired/Different from baseline                                 General Comments: Pt very lethargic, needing continual stimulation to remain awake. Extra time to perform all tasks.      Exercises General Exercises - Upper Extremity Shoulder Flexion: AAROM;Both;Other reps (comment);Supine (x3 reps) Elbow Flexion: AROM;Both;5 reps;Supine General Exercises - Lower Extremity Ankle Circles/Pumps: AROM;Both;5 reps;Supine Quad Sets: AROM;Both;5 reps;Supine Heel Slides: PROM;AAROM;Both;5 reps;Supine (Pt minimally assisting if any)    General Comments General comments (skin integrity, edema, etc.): Provided HEP handout to perform over weekend consisting of: AROM shoulder flexion, AROM elbow flexion, heel slides, ankle pumps, and quad sets      Pertinent Vitals/Pain Pain Assessment:  Faces Faces Pain Scale: Hurts even more Pain Location: bil leg, shoulders, headache, and low back Pain Descriptors / Indicators: Discomfort;Grimacing;Guarding Pain Intervention(s): Limited activity within patient's tolerance;Monitored during session;Premedicated before session;Repositioned    Home Living                      Prior Function            PT Goals (current goals can now be found in the care plan section) Acute Rehab PT Goals Patient Stated Goal: to not hurt PT  Goal Formulation: With patient Time For Goal Achievement: 01/27/21 Potential to Achieve Goals: Good Progress towards PT goals: Not progressing toward goals - comment (limited by pain, lethargy, and bouts of emesis)    Frequency    Min 5X/week      PT Plan Current plan remains appropriate    Co-evaluation              AM-PAC PT "6 Clicks" Mobility   Outcome Measure  Help needed turning from your back to your side while in a flat bed without using bedrails?: A Little Help needed moving from lying on your back to sitting on the side of a flat bed without using bedrails?: A Lot Help needed moving to and from a bed to a chair (including a wheelchair)?: Total Help needed standing up from a chair using your arms (e.g., wheelchair or bedside chair)?: Total Help needed to walk in hospital room?: Total Help needed climbing 3-5 steps with a railing? : Total 6 Click Score: 9    End of Session   Activity Tolerance: Patient limited by pain;Other (comment);Patient limited by lethargy (limited by feeling unwell with bouts of emesis today) Patient left: in bed;with call bell/phone within reach;with bed alarm set;with SCD's reapplied   PT Visit Diagnosis: Pain;Muscle weakness (generalized) (M62.81);Difficulty in walking, not elsewhere classified (R26.2) Pain - Right/Left:  (bil) Pain - part of body: Leg (back)     Time: 4097-3532 PT Time Calculation (min) (ACUTE ONLY): 19 min  Charges:  $Therapeutic Exercise: 8-22 mins                     Raymond Gurney, PT, DPT Acute Rehabilitation Services  Pager: [phone] Office: [phone]    Jewel Baize 01/14/2021, 5:59 PM

## 2021-01-14 NOTE — Progress Notes (Signed)
Progress Note    Louis [name]   STM:[id number]  DOB: 1964-12-24  DOA: 01/05/2021     8  PCP: Louis Mo, NP  CC: back pain  Hospital Course: Louis [name] is a 55 year old Caucasian male with PMH significant for but not limited to DMII, HTN, CAD s/p CABG 2017, anxiety, chronic pain as well as other comorbidities with recent admission with an epidural abscess, vertebral osteomyelitis, psoas abscess and MRSA bacteremia who returned to the ED with worsening back pain.    He reported that his back pain has been stable until approximately 5 days PTA when it became more severe and progressively worsened since.  He did not notice any new leg numbness or weakness, but did have some urinary incontinence.  Back pain was reportedly severe and constant; worse with movement and radiating to both legs.   He denied any fevers or chills but stated he did have some mild drainage from his surgical wounds.   He was admitted on 12/12/2020 after found to have an epidural abscess and vertebral osteomyelitis. He underwent a laminectomy and evacuation of abscess on 12/13/2020 and then drainage of the psoas abscess by IR on 12/22/2020.  Blood cultures from 12/13/2020 grew out MRSA but there is no vegetation seen on TTE and blood cultures were drawn and repeated 12/15/2020 remain negative.   He has been on daptomycin at home. On admission, WBC 23.9, PLTC 681k.  COVID PCR was negative.   He had an MRI of the thoracic and lumbar spine which revealed multiple fluid collections involving the ventral epidural abscess at the L4-L5 level with progressive discitis and osteomyelitis at the L4-L5 and L5-S1 levels.  Blood cultures were repeated in the ED.  Initial plan was for lumbar decompression however Dr. Jake Samples of neurosurgery evaluated and felt that his thoracic mass-effect was improved and that even though there was persistent ostial discitis and epidural abscess throughout the thoracolumbar spine there is possibly a more loculated  focus of the abscess at L4-L5 with stenosis but no focal weakness, groin numbness or numbness and tingling in his legs he did not believe that urgent or emergent decompression was needed at that time.   Patient has been confused more often per his wife and neurosurgery is obtaining MRI of the brain with and without contrast given concern of some cerebral spread of infection.  Because he has failed multiple medical treatments will likely need broaden antibiotic therapy and will consult ID and neurosurgery feels that the stenosis L4-L5 could be decompressed however neurologically patient is doing well with no focal deficits and therefore they are recommending aggressive medical and supportive care.   ID changed to IV Vancomycin and consulted IR for drainage of the deep soft tissue collection with peripheral enhancement measuring 4.7 x 5.0 x 1.7 cm.  Dr. Jake Samples of neurosurgery recommending continuing vancomycin and aggressive medical care.   Patient had some nausea and an abnormal EKG so cardiology was consulted for further evaluation and stat troponins were ordered and they were negative.  Cardiology evaluated and felt that we should repeat a limited echo.  Limited echo done and showed no wall motion abnormalities.  There is no evidence of STEMI.   The patient is to undergo a TEE on 01/11/2021.  ID recommending following blood culture susceptibility.  Per ID if WBC remains high and the susceptibility indicates daptomycin remain sensitive they are recommending that we need to discuss with neurosurgery given regarding debridement for source control.  Interval History:  Significant N/V  this am; seen bedside, he was having green bilious vomitus noted, no blood.   ROS: Constitutional: negative for chills and fevers, Respiratory: negative for cough and sputum, Cardiovascular: negative for chest pain, and Gastrointestinal: negative for abdominal pain  Assessment & Plan: * Sepsis (HCC)-resolved as of  01/13/2021 - initially: tachycardic, tachypneic, leukocytosis; epidural abscess - see bacteremia and abscess/discitis  Discitis - see abscess  Epidural abscess - s/p laminectomy & evacuation of abscess 12/21/20 - treated with Dapto; developed worsening back pain; MRI L- and T-spine to have multiple fluid collections including ventral epidural abscess and progressive osteomyelitis at L 4-5 - daptomycin MIC was non-susceptible on lab send off from 7/13; might explain why no clearance of bacteremia vs source control - negative TTE and TEE - persistently positive blood cultures, MRSA (see bacteremia) -Neurosurgery also following.  Case rediscussed on 01/12/2021 -Repeat CT abdomen/pelvis being performed on 01/12/2021: no discrete fluid collections - now on dual treatment with vanc and ceftaroline per ID for double coverage; no further plans for surgery still at this time; hopeful that abx will treat infection as a whole  MRSA bacteremia - persistently bacteremic - follow up repeat blood cx 7/22 - continue vanc and ceftaroline - ID following - see epidural abscess  Vertebral osteomyelitis (HCC) - ongoing erosion noted on repeat CT 7/20 of L3 and L4 vertebral bodies; no pathologic fracture  Protein-calorie malnutrition, severe - Patient's BMI is Body mass index is 28.56 kg/m.. - Patient has the following signs/symptoms consistent with PCM: (fat loss, muscle loss, muscle wasting). - seen by RD, appreciate assistance. Continue plan per RD -Prealbumin 5.9 mg/dL - too much N/V on 1/61; poor intake; may need cortrak to start TF but on hold for now until N/V improves; unable to place CVL for TPN due to persistent bacteremia at this time as well   Acute metabolic encephalopathy-resolved as of 01/13/2021 - patient symptoms include intermittent confusion - etiology considered due to metabolic and infectious etiologies - Mentation has been slowly improving - MRI brain also performed; motion  degraded.  Mild generalized atrophy, chronic small vessel disease -Noted that patient was also in significant pain on admission which may have also contributed as well as treatment with pain medications  DMII (diabetes mellitus, type 2) (HCC) - A1c was 9.1% in June 2022  - Check CBGs and use SSI for now - diet liberalized in setting of malnutrition; may need to adjust insulin if CBGs elevate  Fall-resolved as of 01/13/2021 -Despite severe weakness, patient fell out of bed on 01/10/2021 -Repeat head CT showed no acute intracranial abnormalities noted -Fall Precautions -Waist belt for safety if needed  Cholestasis - Cholestasis of sepsis -Likely reactive and due to infection - trend CMP  GERD (gastroesophageal reflux disease) - continue protonix   Oral thrush - Continue nystatin oral suspension - Continue oral fluconazole x 7-day course  Hypokalemia -Replete and recheck as needed  Hypomagnesemia -Replete and recheck as needed  Thrombocytosis -Likely reactive in the setting of infection -continue daily CBC  Abnormal EKG -Patient denied Chest Pain but had Nausea but no vomiting -Had ST Elevation in leads V1-V2 without Reciprocal changes -Troponin Checked and went from 24 -> 31 -Cardiology Consulted and checked Limited ECHO -ECHO showed no WMA -Cardiology does not feel he has a STEMI and thinks that his ST elevations are more pronounced in the setting of pain and hypertension  Chronic diastolic CHF (congestive heart failure) (HCC) - monitoring fluid status and renal function - currently on lasix  and albumin  - strict I&O  Anxiety -Continue Venlafaxine XR 75 mg daily and PRN Xanax  Hyponatremia -Appears chronic, possibly related to his SNRI but may also be hypovolemia from infection/fever/insensible losses -Checked urine sodium was 14 and Urine Osm was 484 -BMP daily  Normocytic anemia -Likely multifactorial in setting of ongoing infection, multiple phlebotomy  draws - No overt bleeding  -Checked anemia panel and showed iron level of 19, U IBC 139, TIBC 158, saturation ratios of 12%, ferritin level 864, folate of 2.5, vitamin B12 level 1070 -Continue to monitor for signs and symptoms of bleeding -Continue trending CBC - Hgb drop: 11.6>>8.6 g/dL. Vomitus was bilious when seen; check FOBT. At risk for stress ulcer. Start PPI IV BID. Trend CBC  HTN (hypertension) - continue Toprol  - BP otherwise has been stable   CAD (coronary artery disease) - No anginal complaints - s/p CABG -He had a LIMA to LAD, vein graft to distal circumflex and vein graft RCA in 2017. -His catheterization in March 2020 demonstrated patent vein grafts -Continue aspirin, statin, BB - hold ARB due to low/normal BP  Increased anion gap metabolic acidosis-resolved as of 01/13/2021 - s/p treatment  Old records reviewed in assessment of this patient  Antimicrobials: Vancomycin 01/06/2021 >> 01/12/2021 Fluconazole 01/09/2021 >> current Daptomycin Ceftaroline 7/20 >> current Vanc 7/21 >> current  DVT prophylaxis: enoxaparin (LOVENOX) injection 40 mg Start: 01/09/21 1500 SCDs Start: 01/06/21 0224   Code Status:   Code Status: Full Code Family Communication: Wife  Disposition Plan: Status is: Inpatient  Remains inpatient appropriate because:Ongoing active pain requiring inpatient pain management, Ongoing diagnostic testing needed not appropriate for outpatient work up, IV treatments appropriate due to intensity of illness or inability to take PO, and Inpatient level of care appropriate due to severity of illness  Dispo: The patient is from: Home              Anticipated d/c is to: Home              Patient currently is not medically stable to d/c.   Difficult to place patient No  Risk of unplanned readmission score: Unplanned Admission- Pilot do not use: 33.41   Objective: Blood pressure (!) 131/97, pulse 96, temperature 97.6 F (36.4 C), temperature source Oral,  resp. rate 16, weight 94.3 kg, SpO2 100 %.  Examination: General appearance:  Fatigued appearing and weak resting in bed uncomfortable with active vomiting of bilious fluid observed Head: Normocephalic, without obvious abnormality, atraumatic Eyes:  EOMI Lungs: clear to auscultation bilaterally Heart: regular rate and rhythm and S1, S2 normal Abdomen: normal findings: bowel sounds normal and soft, non-tender Extremities: posterior thigh compartments noted with edema and abdominal wall Skin: mobility and turgor normal Neurologic: Weak throughout (worse in LE) but no focal deficits appreciated  Consultants:  ID Neurosurgery  Cardiology IR  Procedures:  T7-8, T12-L1, L3-L4 laminectomy for Epidural Abscess, 6/28 Left psoas muscle abscess drainage, 6/30 Paraspinal abscess drainage, 7/16  Data Reviewed: I have personally reviewed following labs and imaging studies Results for orders placed or performed during the hospital encounter of 01/05/21 (from the past 24 hour(s))  Glucose, capillary     Status: Abnormal   Collection Time: 01/13/21  3:37 PM  Result Value Ref Range   Glucose-Capillary 169 (H) 70 - 99 mg/dL  Glucose, capillary     Status: Abnormal   Collection Time: 01/13/21  7:48 PM  Result Value Ref Range   Glucose-Capillary 225 (H) 70 - 99  mg/dL  Glucose, capillary     Status: Abnormal   Collection Time: 01/13/21 11:49 PM  Result Value Ref Range   Glucose-Capillary 276 (H) 70 - 99 mg/dL  Glucose, capillary     Status: Abnormal   Collection Time: 01/14/21  3:46 AM  Result Value Ref Range   Glucose-Capillary 173 (H) 70 - 99 mg/dL  CBC with Differential/Platelet     Status: Abnormal   Collection Time: 01/14/21  5:42 AM  Result Value Ref Range   WBC 12.2 (H) 4.0 - 10.5 K/uL   RBC 3.02 (L) 4.22 - 5.81 MIL/uL   Hemoglobin 8.6 (L) 13.0 - 17.0 g/dL   HCT 09.3 (L) 23.5 - 57.3 %   MCV 87.7 80.0 - 100.0 fL   MCH 28.5 26.0 - 34.0 pg   MCHC 32.5 30.0 - 36.0 g/dL   RDW 22.0 (H)  25.4 - 15.5 %   Platelets 428 (H) 150 - 400 K/uL   nRBC 0.0 0.0 - 0.2 %   Neutrophils Relative % 79 %   Neutro Abs 9.7 (H) 1.7 - 7.7 K/uL   Lymphocytes Relative 10 %   Lymphs Abs 1.3 0.7 - 4.0 K/uL   Monocytes Relative 9 %   Monocytes Absolute 1.0 0.1 - 1.0 K/uL   Eosinophils Relative 1 %   Eosinophils Absolute 0.1 0.0 - 0.5 K/uL   Basophils Relative 0 %   Basophils Absolute 0.0 0.0 - 0.1 K/uL   Immature Granulocytes 1 %   Abs Immature Granulocytes 0.09 (H) 0.00 - 0.07 K/uL  Magnesium     Status: None   Collection Time: 01/14/21  5:42 AM  Result Value Ref Range   Magnesium 1.7 1.7 - 2.4 mg/dL  Comprehensive metabolic panel     Status: Abnormal   Collection Time: 01/14/21  5:42 AM  Result Value Ref Range   Sodium 135 135 - 145 mmol/L   Potassium 3.3 (L) 3.5 - 5.1 mmol/L   Chloride 97 (L) 98 - 111 mmol/L   CO2 29 22 - 32 mmol/L   Glucose, Bld 155 (H) 70 - 99 mg/dL   BUN 6 6 - 20 mg/dL   Creatinine, Ser 2.70 (L) 0.61 - 1.24 mg/dL   Calcium 7.9 (L) 8.9 - 10.3 mg/dL   Total Protein 5.1 (L) 6.5 - 8.1 g/dL   Albumin 2.1 (L) 3.5 - 5.0 g/dL   AST 14 (L) 15 - 41 U/L   ALT 12 0 - 44 U/L   Alkaline Phosphatase 70 38 - 126 U/L   Total Bilirubin 0.7 0.3 - 1.2 mg/dL   GFR, Estimated >62 >37 mL/min   Anion gap 9 5 - 15  Phosphorus     Status: None   Collection Time: 01/14/21  5:42 AM  Result Value Ref Range   Phosphorus 3.5 2.5 - 4.6 mg/dL  Glucose, capillary     Status: Abnormal   Collection Time: 01/14/21  7:35 AM  Result Value Ref Range   Glucose-Capillary 157 (H) 70 - 99 mg/dL  Glucose, capillary     Status: Abnormal   Collection Time: 01/14/21 11:18 AM  Result Value Ref Range   Glucose-Capillary 198 (H) 70 - 99 mg/dL    Recent Results (from the past 240 hour(s))  Blood culture (routine x 2)     Status: Abnormal   Collection Time: 01/05/21  5:20 PM   Specimen: BLOOD  Result Value Ref Range Status   Specimen Description BLOOD SITE NOT SPECIFIED  Final  Special Requests    Final    BOTTLES DRAWN AEROBIC AND ANAEROBIC Blood Culture adequate volume   Culture  Setup Time   Final    GRAM POSITIVE COCCI IN BOTH AEROBIC AND ANAEROBIC BOTTLES CRITICAL VALUE NOTED.  VALUE IS CONSISTENT WITH PREVIOUSLY REPORTED AND CALLED VALUE.    Culture (A)  Final    METHICILLIN RESISTANT STAPHYLOCOCCUS AUREUS SEE SEPARATE REPORT Performed at [facility], [address] [place], [place] [address]    Report Status 01/13/2021 FINAL  Final   Organism ID, Bacteria METHICILLIN RESISTANT STAPHYLOCOCCUS AUREUS  Final      Susceptibility   Methicillin resistant staphylococcus aureus - MIC*    CIPROFLOXACIN >=8 RESISTANT Resistant     ERYTHROMYCIN >=8 RESISTANT Resistant     GENTAMICIN <=0.5 SENSITIVE Sensitive     OXACILLIN >=4 RESISTANT Resistant     TETRACYCLINE <=1 SENSITIVE Sensitive     VANCOMYCIN 1 SENSITIVE Sensitive     TRIMETH/SULFA >=320 RESISTANT Resistant     CLINDAMYCIN <=0.25 SENSITIVE Sensitive     RIFAMPIN <=0.5 SENSITIVE Sensitive     Inducible Clindamycin NEGATIVE Sensitive     * METHICILLIN RESISTANT STAPHYLOCOCCUS AUREUS  Blood culture (routine x 2)     Status: Abnormal   Collection Time: 01/05/21  6:10 PM   Specimen: BLOOD  Result Value Ref Range Status   Specimen Description BLOOD SITE NOT SPECIFIED  Final   Special Requests   Final    BOTTLES DRAWN AEROBIC AND ANAEROBIC Blood Culture results may not be optimal due to an inadequate volume of blood received in culture bottles   Culture  Setup Time   Final    GRAM POSITIVE COCCI IN BOTH AEROBIC AND ANAEROBIC BOTTLES CRITICAL VALUE NOTED.  VALUE IS CONSISTENT WITH PREVIOUSLY REPORTED AND CALLED VALUE.    Culture (A)  Final    STAPHYLOCOCCUS AUREUS SUSCEPTIBILITIES PERFORMED ON PREVIOUS CULTURE WITHIN THE LAST 5 DAYS. Performed at [facility], [address] [place], [place] [address]    Report Status 01/08/2021 FINAL  Final  Resp Panel by RT-PCR (Flu A&B, Covid) Nasopharyngeal Swab      Status: None   Collection Time: 01/05/21  9:32 PM   Specimen: Nasopharyngeal Swab; Nasopharyngeal(NP) swabs in vial transport medium  Result Value Ref Range Status   SARS Coronavirus 2 by RT PCR NEGATIVE NEGATIVE Final    Comment: (NOTE) SARS-CoV-2 target nucleic acids are NOT DETECTED.  The SARS-CoV-2 RNA is generally detectable in upper respiratory specimens during the acute phase of infection. The lowest concentration of SARS-CoV-2 viral copies this assay can detect is 138 copies/mL. A negative result does not preclude SARS-Cov-2 infection and should not be used as the sole basis for treatment or other patient management decisions. A negative result may occur with  improper specimen collection/handling, submission of specimen other than nasopharyngeal swab, presence of viral mutation(s) within the areas targeted by this assay, and inadequate number of viral copies(<138 copies/mL). A negative result must be combined with clinical observations, patient history, and epidemiological information. The expected result is Negative.  Fact Sheet for Patients:  BloggerCourse.comhttps://www.fda.gov/media/152166/download  Fact Sheet for Healthcare Providers:  SeriousBroker.ithttps://www.fda.gov/media/152162/download  This test is no t yet approved or cleared by the [place] FDA and  has been authorized for detection and/or diagnosis of SARS-CoV-2 by FDA under an Emergency Use Authorization (EUA). This EUA will remain  in effect (meaning this test can be used) for the duration of the COVID-19 declaration under Section  564(b)(1) of the Act, 21 U.S.C.section 360bbb-3(b)(1), unless the authorization is terminated  or revoked sooner.       Influenza A by PCR NEGATIVE NEGATIVE Final   Influenza B by PCR NEGATIVE NEGATIVE Final    Comment: (NOTE) The Xpert Xpress SARS-CoV-2/FLU/RSV plus assay is intended as an aid in the diagnosis of influenza from Nasopharyngeal swab specimens and should not be used as a sole basis  for treatment. Nasal washings and aspirates are unacceptable for Xpert Xpress SARS-CoV-2/FLU/RSV testing.  Fact Sheet for Patients: BloggerCourse.com  Fact Sheet for Healthcare Providers: SeriousBroker.it  This test is not yet approved or cleared by the [place] FDA and has been authorized for detection and/or diagnosis of SARS-CoV-2 by FDA under an Emergency Use Authorization (EUA). This EUA will remain in effect (meaning this test can be used) for the duration of the COVID-19 declaration under Section 564(b)(1) of the Act, 21 U.S.C. section 360bbb-3(b)(1), unless the authorization is terminated or revoked.  Performed at [facility], [address] [place], [place] [address]   Culture, blood (routine x 2)     Status: Abnormal   Collection Time: 01/07/21  6:52 AM   Specimen: BLOOD  Result Value Ref Range Status   Specimen Description BLOOD RIGHT ANTECUBITAL  Final   Special Requests   Final    BOTTLES DRAWN AEROBIC AND ANAEROBIC Blood Culture adequate volume   Culture  Setup Time   Final    GRAM POSITIVE COCCI IN CLUSTERS ANAEROBIC BOTTLE ONLY CRITICAL VALUE NOTED.  VALUE IS CONSISTENT WITH PREVIOUSLY REPORTED AND CALLED VALUE.    Culture (A)  Final    STAPHYLOCOCCUS AUREUS SUSCEPTIBILITIES PERFORMED ON PREVIOUS CULTURE WITHIN THE LAST 5 DAYS. Performed at [facility], [address] [place], [place] [address]    Report Status 01/10/2021 FINAL  Final  Culture, blood (routine x 2)     Status: None   Collection Time: 01/07/21  6:52 AM   Specimen: BLOOD RIGHT HAND  Result Value Ref Range Status   Specimen Description BLOOD RIGHT HAND  Final   Special Requests   Final    BOTTLES DRAWN AEROBIC ONLY Blood Culture results may not be optimal due to an inadequate volume of blood received in culture bottles   Culture   Final    NO GROWTH 5 DAYS Performed at [facility], [address] [place],  [place] [address]    Report Status 01/12/2021 FINAL  Final  Aerobic/Anaerobic Culture w Gram Stain (surgical/deep wound)     Status: None   Collection Time: 01/08/21 10:10 AM   Specimen: Wound  Result Value Ref Range Status   Specimen Description WOUND  Final   Special Requests NONE  Final   Gram Stain   Final    ABUNDANT WBC PRESENT, PREDOMINANTLY PMN NO ORGANISMS SEEN    Culture   Final    No growth aerobically or anaerobically. Performed at [facility], [address] [place], [place] [address]    Report Status 01/13/2021 FINAL  Final  Culture, blood (Routine X 2) w Reflex to ID Panel     Status: Abnormal   Collection Time: 01/09/21 10:26 AM   Specimen: BLOOD  Result Value Ref Range Status   Specimen Description BLOOD RIGHT ANTECUBITAL  Final   Special Requests   Final    BOTTLES DRAWN AEROBIC AND ANAEROBIC Blood Culture adequate volume   Culture  Setup Time   Final  GRAM POSITIVE COCCI IN CLUSTERS ANAEROBIC BOTTLE ONLY CRITICAL VALUE NOTED.  VALUE IS CONSISTENT WITH PREVIOUSLY REPORTED AND CALLED VALUE.    Culture (A)  Final    STAPHYLOCOCCUS AUREUS SUSCEPTIBILITIES PERFORMED ON PREVIOUS CULTURE WITHIN THE LAST 5 DAYS. Performed at [facility], [address] [place], [place] [address]    Report Status 01/12/2021 FINAL  Final  Culture, blood (Routine X 2) w Reflex to ID Panel     Status: Abnormal   Collection Time: 01/09/21 10:49 AM   Specimen: BLOOD LEFT HAND  Result Value Ref Range Status   Specimen Description BLOOD LEFT HAND  Final   Special Requests   Final    BOTTLES DRAWN AEROBIC AND ANAEROBIC Blood Culture results may not be optimal due to an inadequate volume of blood received in culture bottles   Culture  Setup Time   Final    GRAM POSITIVE COCCI IN CLUSTERS IN BOTH AEROBIC AND ANAEROBIC BOTTLES CRITICAL VALUE NOTED.  VALUE IS CONSISTENT WITH PREVIOUSLY REPORTED AND CALLED VALUE.    Culture (A)  Final    STAPHYLOCOCCUS AUREUS SUSCEPTIBILITIES  PERFORMED ON PREVIOUS CULTURE WITHIN THE LAST 5 DAYS. Performed at [facility], [address] [place], [place] [address]    Report Status 01/12/2021 FINAL  Final  Culture, blood (routine x 2)     Status: Abnormal   Collection Time: 01/11/21  2:02 PM   Specimen: BLOOD RIGHT HAND  Result Value Ref Range Status   Specimen Description BLOOD RIGHT HAND  Final   Special Requests   Final    BOTTLES DRAWN AEROBIC AND ANAEROBIC Blood Culture adequate volume   Culture  Setup Time   Final    GRAM POSITIVE COCCI IN CLUSTERS IN BOTH AEROBIC AND ANAEROBIC BOTTLES CRITICAL RESULT CALLED TO, READ BACK BY AND VERIFIED WITH: J. LEDFORD PHARMD, AT 0630 01/12/21 D. VANHOOK    Culture (A)  Final    STAPHYLOCOCCUS AUREUS SUSCEPTIBILITIES PERFORMED ON PREVIOUS CULTURE WITHIN THE LAST 5 DAYS. Performed at [facility], [address] [place], [place] [address]    Report Status 01/14/2021 FINAL  Final  Culture, blood (routine x 2)     Status: Abnormal   Collection Time: 01/11/21  2:03 PM   Specimen: BLOOD LEFT HAND  Result Value Ref Range Status   Specimen Description BLOOD LEFT HAND  Final   Special Requests   Final    BOTTLES DRAWN AEROBIC AND ANAEROBIC Blood Culture adequate volume   Culture  Setup Time   Final    GRAM POSITIVE COCCI IN CLUSTERS ANAEROBIC BOTTLE ONLY CRITICAL VALUE NOTED.  VALUE IS CONSISTENT WITH PREVIOUSLY REPORTED AND CALLED VALUE. Performed at [facility], [address] [place], [place] [address]    Culture METHICILLIN RESISTANT STAPHYLOCOCCUS AUREUS (A)  Final   Report Status 01/14/2021 FINAL  Final   Organism ID, Bacteria METHICILLIN RESISTANT STAPHYLOCOCCUS AUREUS  Final      Susceptibility   Methicillin resistant staphylococcus aureus - MIC*    CIPROFLOXACIN >=8 RESISTANT Resistant     ERYTHROMYCIN >=8 RESISTANT Resistant     GENTAMICIN <=0.5 SENSITIVE Sensitive     OXACILLIN >=4 RESISTANT Resistant     TETRACYCLINE <=1 SENSITIVE Sensitive      VANCOMYCIN <=0.5 SENSITIVE Sensitive     TRIMETH/SULFA >=320 RESISTANT Resistant     CLINDAMYCIN <=0.25 SENSITIVE Sensitive     RIFAMPIN <=0.5 SENSITIVE Sensitive     Inducible Clindamycin NEGATIVE Sensitive     *  METHICILLIN RESISTANT STAPHYLOCOCCUS AUREUS  Culture, blood (routine x 2)     Status: Abnormal (Preliminary result)   Collection Time: 01/12/21 11:47 AM   Specimen: BLOOD RIGHT HAND  Result Value Ref Range Status   Specimen Description BLOOD RIGHT HAND  Final   Special Requests   Final    BOTTLES DRAWN AEROBIC AND ANAEROBIC Blood Culture results may not be optimal due to an inadequate volume of blood received in culture bottles   Culture  Setup Time   Final    GRAM POSITIVE COCCI IN CLUSTERS IN BOTH AEROBIC AND ANAEROBIC BOTTLES CRITICAL VALUE NOTED.  VALUE IS CONSISTENT WITH PREVIOUSLY REPORTED AND CALLED VALUE.    Culture (A)  Final    STAPHYLOCOCCUS AUREUS SUSCEPTIBILITIES PERFORMED ON PREVIOUS CULTURE WITHIN THE LAST 5 DAYS. Performed at [facility], [address] [place], [place] [address]    Report Status PENDING  Incomplete  Culture, blood (routine x 2)     Status: None (Preliminary result)   Collection Time: 01/12/21 11:52 AM   Specimen: BLOOD LEFT HAND  Result Value Ref Range Status   Specimen Description BLOOD LEFT HAND  Final   Special Requests   Final    BOTTLES DRAWN AEROBIC ONLY Blood Culture adequate volume   Culture   Final    NO GROWTH 2 DAYS Performed at [facility], [address], [place], [place] [address]    Report Status PENDING  Incomplete     Radiology Studies: CT ABDOMEN PELVIS W CONTRAST  Result Date: 01/12/2021 CLINICAL DATA:  Abdominal pain, fever, bacteremia EXAM: CT ABDOMEN AND PELVIS WITH CONTRAST TECHNIQUE: Multidetector CT imaging of the abdomen and pelvis was performed using the standard protocol following bolus administration of intravenous contrast. CONTRAST:  OMNIPAQUE IOHEXOL 300 MG/ML  SOLN COMPARISON:   12/16/2020, lumbar spine MRI 12/12/2020 FINDINGS: Lower chest: Small right and moderate to large left pleural effusions have developed, new since prior examination. There is left lower lobe collapse. Extensive coronary artery calcification noted. Cardiac within normal limits. Hepatobiliary: No focal liver abnormality is seen. No gallstones, gallbladder wall thickening, or biliary dilatation. Pancreas: Unremarkable Spleen: Unremarkable Adrenals/Urinary Tract: The adrenal glands are unremarkable. The kidneys are normal in size and position. Minimally complex rim calcified exophytic cyst is again seen arising from the lower pole of the left kidney, better assessed as a Bosniak class 2 cyst on prior MRI examination 12/12/2020. The bladder is unremarkable. Stomach/Bowel: Stomach is within normal limits. Appendix appears normal. No evidence of bowel wall thickening, distention, or inflammatory changes. Mild free fluid has developed within the pelvis. Vascular/Lymphatic: Mild aortoiliac atherosclerotic calcification. No aortic aneurysm. No pathologic adenopathy within the abdomen and pelvis. Reproductive: Prostate is unremarkable. Other: The paraspinal inflammatory collections seen at L3-L5 have enlarged since prior examination. The rim enhancing inflammatory collection measures roughly 3.1 x 4.9 cm on axial image # 56/3 and is now better appreciated on the right measuring roughly 2.3 x 3.5 cm at axial image # 52/3. No discrete drainable fluid is identified. There is interval development of moderate diffuse subcutaneous edema. 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 No pathologic fracture. The spinal canal is not well assessed with this technique. No other lytic or blastic bone lesions are identified.  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 No pathologic fracture. Increasing paraspinal inflammatory collections adjacent to L3-L5 bilaterally without discrete drainable fluid at this time. Progressive anasarca with development of pleural effusions, mild ascites, and moderate diffuse subcutaneous body wall edema. Aortic Atherosclerosis (ICD10-I70.0). Electronically Signed   By: Helyn Numbers MD   On: 01/12/2021 20:12   CT ABDOMEN PELVIS W CONTRAST  Final Result    CT PELVIS W CONTRAST  Final Result    CT HEAD WO CONTRAST  Final Result    MR BRAIN W WO CONTRAST  Final Result    MR Lumbar Spine W Wo Contrast  Final Result    MR THORACIC SPINE W WO CONTRAST  Final Result    [place] FNA SOFT TISSUE    (Results Pending)    Scheduled Meds:  aspirin EC  81 mg Oral Daily   atorvastatin  40 mg Oral QHS   enoxaparin (LOVENOX) injection  40 mg Subcutaneous QHS   feeding supplement  237 mL Oral TID BM   fluconazole  100 mg Oral Daily   furosemide  40 mg Intravenous Daily   gabapentin  200 mg Oral TID   insulin aspart  0-9 Units Subcutaneous Q4H   lactulose  10 g Oral Daily   magnesium oxide  800 mg Oral Once   mouth rinse  15 mL Mouth Rinse BID   metoprolol succinate  25 mg Oral Daily   multivitamin with minerals  1 tablet Oral Daily   nystatin  5 mL Oral QID   oxyCODONE  20 mg Oral Q12H   pantoprazole (PROTONIX) IV  40 mg Intravenous BID   polyethylene glycol  17 g Oral BID   potassium chloride  40 mEq Oral Once   venlafaxine XR  75 mg Oral q morning   PRN Meds: acetaminophen **OR** [DISCONTINUED] acetaminophen, ALPRAZolam, alum & mag hydroxide-simeth, cyclobenzaprine, diclofenac Sodium, HYDROmorphone (DILAUDID) injection, labetalol, lidocaine, ondansetron **OR** ondansetron (ZOFRAN) IV, promethazine (PHENERGAN) injection (IM or IVPB) Continuous Infusions:  albumin human 25 g (01/14/21  1154)   ceFTAROline (TEFLARO) IV 600 mg (01/14/21 0547)   promethazine (PHENERGAN) injection (IM or IVPB)     vancomycin 1,250 mg (01/14/21 0344)     LOS: 8 days  Time spent: Greater than 50% of the 35 minute visit was spent in counseling/coordination of care for the patient as laid out in the A&P.   Lewie Chamber, MD [facility] 01/14/2021, 3:07 PM

## 2021-01-15 DIAGNOSIS — A419 Sepsis, unspecified organism: Secondary | ICD-10-CM | POA: Diagnosis not present

## 2021-01-15 LAB — COMPREHENSIVE METABOLIC PANEL
ALT: 9 U/L (ref 0–44)
AST: 10 U/L — ABNORMAL LOW (ref 15–41)
Albumin: 2.8 g/dL — ABNORMAL LOW (ref 3.5–5.0)
Alkaline Phosphatase: 78 U/L (ref 38–126)
Anion gap: 9 (ref 5–15)
BUN: 5 mg/dL — ABNORMAL LOW (ref 6–20)
CO2: 29 mmol/L (ref 22–32)
Calcium: 8.5 mg/dL — ABNORMAL LOW (ref 8.9–10.3)
Chloride: 98 mmol/L (ref 98–111)
Creatinine, Ser: 0.48 mg/dL — ABNORMAL LOW (ref 0.61–1.24)
GFR, Estimated: >60 mL/min (ref >60)
Glucose, Bld: 187 mg/dL — ABNORMAL HIGH (ref 70–99)
Potassium: 3.4 mmol/L — ABNORMAL LOW (ref 3.5–5.1)
Sodium: 136 mmol/L (ref 135–145)
Total Bilirubin: 0.8 mg/dL (ref 0.3–1.2)
Total Protein: 5.9 g/dL — ABNORMAL LOW (ref 6.5–8.1)

## 2021-01-15 LAB — CBC WITH DIFFERENTIAL/PLATELET
Abs Immature Granulocytes: 0.13 10*3/uL — ABNORMAL HIGH (ref 0.00–0.07)
Basophils Absolute: 0 10*3/uL (ref 0.0–0.1)
Basophils Relative: 0 %
Eosinophils Absolute: 0 10*3/uL (ref 0.0–0.5)
Eosinophils Relative: 0 %
HCT: 27.3 % — ABNORMAL LOW (ref 39.0–52.0)
Hemoglobin: 8.9 g/dL — ABNORMAL LOW (ref 13.0–17.0)
Immature Granulocytes: 1 %
Lymphocytes Relative: 6 %
Lymphs Abs: 1.1 10*3/uL (ref 0.7–4.0)
MCH: 28.9 pg (ref 26.0–34.0)
MCHC: 32.6 g/dL (ref 30.0–36.0)
MCV: 88.6 fL (ref 80.0–100.0)
Monocytes Absolute: 1.3 10*3/uL — ABNORMAL HIGH (ref 0.1–1.0)
Monocytes Relative: 7 %
Neutro Abs: 16.5 10*3/uL — ABNORMAL HIGH (ref 1.7–7.7)
Neutrophils Relative %: 86 %
Platelets: 540 10*3/uL — ABNORMAL HIGH (ref 150–400)
RBC: 3.08 MIL/uL — ABNORMAL LOW (ref 4.22–5.81)
RDW: 15.8 % — ABNORMAL HIGH (ref 11.5–15.5)
WBC: 19.1 10*3/uL — ABNORMAL HIGH (ref 4.0–10.5)
nRBC: 0 % (ref 0.0–0.2)

## 2021-01-15 LAB — GLUCOSE, CAPILLARY
Glucose-Capillary: 172 mg/dL — ABNORMAL HIGH (ref 70–99)
Glucose-Capillary: 186 mg/dL — ABNORMAL HIGH (ref 70–99)
Glucose-Capillary: 186 mg/dL — ABNORMAL HIGH (ref 70–99)
Glucose-Capillary: 195 mg/dL — ABNORMAL HIGH (ref 70–99)
Glucose-Capillary: 210 mg/dL — ABNORMAL HIGH (ref 70–99)
Glucose-Capillary: 211 mg/dL — ABNORMAL HIGH (ref 70–99)

## 2021-01-15 LAB — PHOSPHORUS: Phosphorus: 2.6 mg/dL (ref 2.5–4.6)

## 2021-01-15 LAB — CULTURE, BLOOD (ROUTINE X 2)

## 2021-01-15 LAB — VANCOMYCIN, PEAK: Vancomycin Pk: 28 ug/mL — ABNORMAL LOW (ref 30–40)

## 2021-01-15 LAB — MAGNESIUM: Magnesium: 1.7 mg/dL (ref 1.7–2.4)

## 2021-01-15 LAB — VANCOMYCIN, TROUGH: Vancomycin Tr: 21 ug/mL (ref 15–20)

## 2021-01-15 MED ORDER — POTASSIUM CHLORIDE 10 MEQ/100ML IV SOLN
10.0000 meq | INTRAVENOUS | Status: AC
  Administered 2021-01-15 (×4): 10 meq via INTRAVENOUS
  Filled 2021-01-15: qty 100

## 2021-01-15 MED ORDER — VANCOMYCIN HCL 1750 MG/350ML IV SOLN
1750.0000 mg | Freq: Two times a day (BID) | INTRAVENOUS | Status: DC
  Administered 2021-01-16 – 2021-01-24 (×18): 1750 mg via INTRAVENOUS
  Filled 2021-01-15 (×19): qty 350

## 2021-01-15 MED ORDER — ALBUMIN HUMAN 25 % IV SOLN
25.0000 g | Freq: Four times a day (QID) | INTRAVENOUS | Status: DC
  Administered 2021-01-15 – 2021-01-17 (×7): 25 g via INTRAVENOUS
  Filled 2021-01-15 (×8): qty 100

## 2021-01-15 MED ORDER — FUROSEMIDE 10 MG/ML IJ SOLN
40.0000 mg | Freq: Every day | INTRAMUSCULAR | Status: DC
  Administered 2021-01-16 – 2021-01-17 (×2): 40 mg via INTRAVENOUS
  Filled 2021-01-15 (×2): qty 4

## 2021-01-15 MED ORDER — MAGNESIUM SULFATE 2 GM/50ML IV SOLN
2.0000 g | Freq: Once | INTRAVENOUS | Status: AC
  Administered 2021-01-15: 2 g via INTRAVENOUS
  Filled 2021-01-15: qty 50

## 2021-01-15 NOTE — Progress Notes (Signed)
Progress Note    Louis [name]   MVH:[id number]  DOB: 26-Sep-1965  DOA: 01/05/2021     9  PCP: Bobette Mo, NP  CC: back pain  Hospital Course: Louis [name] is a 55 year old Caucasian male with PMH significant for but not limited to DMII, HTN, CAD s/p CABG 2017, anxiety, chronic pain as well as other comorbidities with recent admission with an epidural abscess, vertebral osteomyelitis, psoas abscess and MRSA bacteremia who returned to the ED with worsening back pain.    He reported that his back pain has been stable until approximately 5 days PTA when it became more severe and progressively worsened since.  He did not notice any new leg numbness or weakness, but did have some urinary incontinence.  Back pain was reportedly severe and constant; worse with movement and radiating to both legs.   He denied any fevers or chills but stated he did have some mild drainage from his surgical wounds.   He was admitted on 12/12/2020 after found to have an epidural abscess and vertebral osteomyelitis. He underwent a laminectomy and evacuation of abscess on 12/13/2020 and then drainage of the psoas abscess by IR on 12/22/2020.  Blood cultures from 12/13/2020 grew out MRSA but there is no vegetation seen on TTE and blood cultures were drawn and repeated 12/15/2020 remain negative.   He has been on daptomycin at home. On admission, WBC 23.9, PLTC 681k.  COVID PCR was negative.   He had an MRI of the thoracic and lumbar spine which revealed multiple fluid collections involving the ventral epidural abscess at the L4-L5 level with progressive discitis and osteomyelitis at the L4-L5 and L5-S1 levels.  Blood cultures were repeated in the ED.  Initial plan was for lumbar decompression however Dr. Jake Samples of neurosurgery evaluated and felt that his thoracic mass-effect was improved and that even though there was persistent ostial discitis and epidural abscess throughout the thoracolumbar spine there is possibly a more loculated  focus of the abscess at L4-L5 with stenosis but no focal weakness, groin numbness or numbness and tingling in his legs he did not believe that urgent or emergent decompression was needed at that time.   Patient has been confused more often per his wife and neurosurgery is obtaining MRI of the brain with and without contrast given concern of some cerebral spread of infection.  Because he has failed multiple medical treatments will likely need broaden antibiotic therapy and will consult ID and neurosurgery feels that the stenosis L4-L5 could be decompressed however neurologically patient is doing well with no focal deficits and therefore they are recommending aggressive medical and supportive care.   ID changed to IV Vancomycin and consulted IR for drainage of the deep soft tissue collection with peripheral enhancement measuring 4.7 x 5.0 x 1.7 cm.  Dr. Jake Samples of neurosurgery recommending continuing vancomycin and aggressive medical care.   Patient had some nausea and an abnormal EKG so cardiology was consulted for further evaluation and stat troponins were ordered and they were negative.  Cardiology evaluated and felt that we should repeat a limited echo.  Limited echo done and showed no wall motion abnormalities.  There is no evidence of STEMI.   The patient is to undergo a TEE on 01/11/2021.  ID recommending following blood culture susceptibility.  Per ID if WBC remains high and the susceptibility indicates daptomycin remain sensitive they are recommending that we need to discuss with neurosurgery given regarding debridement for source control.  Interval History:  Still having  ongoing bilious vomiting but frequency is slightly improved compared to yesterday.  Offered an NG tube today but he states he wishes to wait until tomorrow to see if the vomiting improves further.  ROS: Constitutional: negative for chills and fevers, Respiratory: negative for cough and sputum, Cardiovascular: negative for chest  pain, and Gastrointestinal: negative for abdominal pain  Assessment & Plan: * Sepsis (HCC)-resolved as of 01/13/2021 - initially: tachycardic, tachypneic, leukocytosis; epidural abscess - see bacteremia and abscess/discitis  Discitis - see abscess  Epidural abscess - s/p laminectomy & evacuation of abscess 12/21/20 - treated with Dapto; developed worsening back pain; MRI L- and T-spine to have multiple fluid collections including ventral epidural abscess and progressive osteomyelitis at L 4-5 - daptomycin MIC was non-susceptible on lab send off from 7/13; might explain why no clearance of bacteremia vs source control - negative TTE and TEE - persistently positive blood cultures, MRSA (see bacteremia) -Neurosurgery also following.  Case rediscussed on 01/12/2021 -Repeat CT abdomen/pelvis being performed on 01/12/2021: no discrete fluid collections - now on dual treatment with vanc and ceftaroline per ID for double coverage; no further plans for surgery still at this time; hopeful that abx will treat infection as a whole  MRSA bacteremia - persistently bacteremic - follow up repeat blood cx 7/22: Negative x 1 day so far - continue vanc and ceftaroline - ID following - see epidural abscess  Vertebral osteomyelitis (HCC) - ongoing erosion noted on repeat CT 7/20 of L3 and L4 vertebral bodies; no pathologic fracture  Protein-calorie malnutrition, severe - Patient's BMI is Body mass index is 28.56 kg/m.. - Patient has the following signs/symptoms consistent with PCM: (fat loss, muscle loss, muscle wasting). - seen by RD, appreciate assistance. Continue plan per RD -Prealbumin 5.9 mg/dL - too much N/V on 1/44; poor intake; may need cortrak to start TF but on hold for now until N/V improves; unable to place CVL for TPN due to persistent bacteremia at this time as well  - if ongoing N/V, may need NGT on 7/24 for decompression  Acute metabolic encephalopathy-resolved as of 01/13/2021 -  patient symptoms include intermittent confusion - etiology considered due to metabolic and infectious etiologies - Mentation has been slowly improving - MRI brain also performed; motion degraded.  Mild generalized atrophy, chronic small vessel disease -Noted that patient was also in significant pain on admission which may have also contributed as well as treatment with pain medications  DMII (diabetes mellitus, type 2) (HCC) - A1c was 9.1% in June 2022  - Check CBGs and use SSI for now - diet liberalized in setting of malnutrition; may need to adjust insulin if CBGs elevate  Fall-resolved as of 01/13/2021 -Despite severe weakness, patient fell out of bed on 01/10/2021 -Repeat head CT showed no acute intracranial abnormalities noted -Fall Precautions -Waist belt for safety if needed  Cholestasis - Cholestasis of sepsis -Likely reactive and due to infection - trend CMP  GERD (gastroesophageal reflux disease) - continue protonix   Oral thrush - Continue nystatin oral suspension - Completed oral fluconazole x 7-day course  Hypokalemia -Replete and recheck as needed  Hypomagnesemia -Replete and recheck as needed  Thrombocytosis -Likely reactive in the setting of infection -continue daily CBC  Abnormal EKG -Patient denied Chest Pain but had Nausea but no vomiting -Had ST Elevation in leads V1-V2 without Reciprocal changes -Troponin Checked and went from 24 -> 31 -Cardiology Consulted and checked Limited ECHO -ECHO showed no WMA -Cardiology does not feel he has a  STEMI and thinks that his ST elevations are more pronounced in the setting of pain and hypertension  Chronic diastolic CHF (congestive heart failure) (HCC) - monitoring fluid status and renal function - currently on lasix and albumin  - strict I&O  Anxiety -Continue Venlafaxine XR 75 mg daily and PRN Xanax  Hyponatremia -Appears chronic, possibly related to his SNRI but may also be hypovolemia from  infection/fever/insensible losses -Checked urine sodium was 14 and Urine Osm was 484 -BMP daily  Normocytic anemia -Likely multifactorial in setting of ongoing infection, multiple phlebotomy draws - No overt bleeding  -Checked anemia panel and showed iron level of 19, U IBC 139, TIBC 158, saturation ratios of 12%, ferritin level 864, folate of 2.5, vitamin B12 level 1070 -Continue to monitor for signs and symptoms of bleeding -Continue trending CBC - Hgb drop: 11.6>>8.6 g/dL on 4/097/22. Vomitus was bilious when seen; check FOBT. At risk for stress ulcer. Start PPI IV BID. Trend CBC  HTN (hypertension) - continue Toprol  - BP otherwise has been stable   CAD (coronary artery disease) - No anginal complaints - s/p CABG -He had a LIMA to LAD, vein graft to distal circumflex and vein graft RCA in 2017. -His catheterization in March 2020 demonstrated patent vein grafts -Continue aspirin, statin, BB - hold ARB due to low/normal BP  Increased anion gap metabolic acidosis-resolved as of 01/13/2021 - s/p treatment  Old records reviewed in assessment of this patient  Antimicrobials: Vancomycin 01/06/2021 >> 01/12/2021 Fluconazole 01/09/2021 >> 01/15/2021 Daptomycin Ceftaroline 7/20 >> current Vanc 7/21 >> current  DVT prophylaxis: enoxaparin (LOVENOX) injection 40 mg Start: 01/09/21 1500 SCDs Start: 01/06/21 0224   Code Status:   Code Status: Full Code Family Communication: Wife  Disposition Plan: Status is: Inpatient  Remains inpatient appropriate because:Ongoing active pain requiring inpatient pain management, Ongoing diagnostic testing needed not appropriate for outpatient work up, IV treatments appropriate due to intensity of illness or inability to take PO, and Inpatient level of care appropriate due to severity of illness  Dispo: The patient is from: Home              Anticipated d/c is to: Home              Patient currently is not medically stable to d/c.   Difficult to place  patient No  Risk of unplanned readmission score: Unplanned Admission- Pilot do not use: 33.52   Objective: Blood pressure (!) 164/92, pulse (!) 108, temperature 98.1 F (36.7 C), temperature source Oral, resp. rate 18, weight 94.3 kg, SpO2 96 %.  Examination: General appearance:  Fatigued appearing and weak resting in bed uncomfortable with active vomiting of bilious fluid observed Head: Normocephalic, without obvious abnormality, atraumatic Eyes:  EOMI Lungs: clear to auscultation bilaterally Heart: regular rate and rhythm and S1, S2 normal Abdomen: normal findings: bowel sounds normal and soft, non-tender Extremities: posterior thigh compartments noted with edema and abdominal wall Skin: mobility and turgor normal Neurologic: Weak throughout (worse in LE) but no focal deficits appreciated  Consultants:  ID Neurosurgery  Cardiology IR  Procedures:  T7-8, T12-L1, L3-L4 laminectomy for Epidural Abscess, 6/28 Left psoas muscle abscess drainage, 6/30 Paraspinal abscess drainage, 7/16  Data Reviewed: I have personally reviewed following labs and imaging studies Results for orders placed or performed during the hospital encounter of 01/05/21 (from the past 24 hour(s))  Glucose, capillary     Status: Abnormal   Collection Time: 01/14/21  3:20 PM  Result Value Ref Range  Glucose-Capillary 219 (H) 70 - 99 mg/dL  Glucose, capillary     Status: Abnormal   Collection Time: 01/14/21  8:05 PM  Result Value Ref Range   Glucose-Capillary 166 (H) 70 - 99 mg/dL  Glucose, capillary     Status: Abnormal   Collection Time: 01/14/21 11:34 PM  Result Value Ref Range   Glucose-Capillary 192 (H) 70 - 99 mg/dL  Glucose, capillary     Status: Abnormal   Collection Time: 01/15/21  4:07 AM  Result Value Ref Range   Glucose-Capillary 186 (H) 70 - 99 mg/dL  CBC with Differential/Platelet     Status: Abnormal   Collection Time: 01/15/21  7:25 AM  Result Value Ref Range   WBC 19.1 (H) 4.0 - 10.5  K/uL   RBC 3.08 (L) 4.22 - 5.81 MIL/uL   Hemoglobin 8.9 (L) 13.0 - 17.0 g/dL   HCT 31.5 (L) 40.0 - 86.7 %   MCV 88.6 80.0 - 100.0 fL   MCH 28.9 26.0 - 34.0 pg   MCHC 32.6 30.0 - 36.0 g/dL   RDW 61.9 (H) 50.9 - 32.6 %   Platelets 540 (H) 150 - 400 K/uL   nRBC 0.0 0.0 - 0.2 %   Neutrophils Relative % 86 %   Neutro Abs 16.5 (H) 1.7 - 7.7 K/uL   Lymphocytes Relative 6 %   Lymphs Abs 1.1 0.7 - 4.0 K/uL   Monocytes Relative 7 %   Monocytes Absolute 1.3 (H) 0.1 - 1.0 K/uL   Eosinophils Relative 0 %   Eosinophils Absolute 0.0 0.0 - 0.5 K/uL   Basophils Relative 0 %   Basophils Absolute 0.0 0.0 - 0.1 K/uL   Immature Granulocytes 1 %   Abs Immature Granulocytes 0.13 (H) 0.00 - 0.07 K/uL  Magnesium     Status: None   Collection Time: 01/15/21  7:25 AM  Result Value Ref Range   Magnesium 1.7 1.7 - 2.4 mg/dL  Comprehensive metabolic panel     Status: Abnormal   Collection Time: 01/15/21  7:25 AM  Result Value Ref Range   Sodium 136 135 - 145 mmol/L   Potassium 3.4 (L) 3.5 - 5.1 mmol/L   Chloride 98 98 - 111 mmol/L   CO2 29 22 - 32 mmol/L   Glucose, Bld 187 (H) 70 - 99 mg/dL   BUN 5 (L) 6 - 20 mg/dL   Creatinine, Ser 7.12 (L) 0.61 - 1.24 mg/dL   Calcium 8.5 (L) 8.9 - 10.3 mg/dL   Total Protein 5.9 (L) 6.5 - 8.1 g/dL   Albumin 2.8 (L) 3.5 - 5.0 g/dL   AST 10 (L) 15 - 41 U/L   ALT 9 0 - 44 U/L   Alkaline Phosphatase 78 38 - 126 U/L   Total Bilirubin 0.8 0.3 - 1.2 mg/dL   GFR, Estimated >45 >80 mL/min   Anion gap 9 5 - 15  Phosphorus     Status: None   Collection Time: 01/15/21  7:25 AM  Result Value Ref Range   Phosphorus 2.6 2.5 - 4.6 mg/dL  Vancomycin, peak     Status: Abnormal   Collection Time: 01/15/21  7:25 AM  Result Value Ref Range   Vancomycin Pk 28 (L) 30 - 40 ug/mL  Glucose, capillary     Status: Abnormal   Collection Time: 01/15/21  8:57 AM  Result Value Ref Range   Glucose-Capillary 210 (H) 70 - 99 mg/dL  Vancomycin, trough     Status: Abnormal   Collection  Time: 01/15/21 11:37 AM  Result Value Ref Range   Vancomycin Tr 21 (HH) 15 - 20 ug/mL  Glucose, capillary     Status: Abnormal   Collection Time: 01/15/21 11:57 AM  Result Value Ref Range   Glucose-Capillary 186 (H) 70 - 99 mg/dL    Recent Results (from the past 240 hour(s))  Blood culture (routine x 2)     Status: Abnormal   Collection Time: 01/05/21  5:20 PM   Specimen: BLOOD  Result Value Ref Range Status   Specimen Description BLOOD SITE NOT SPECIFIED  Final   Special Requests   Final    BOTTLES DRAWN AEROBIC AND ANAEROBIC Blood Culture adequate volume   Culture  Setup Time   Final    GRAM POSITIVE COCCI IN BOTH AEROBIC AND ANAEROBIC BOTTLES CRITICAL VALUE NOTED.  VALUE IS CONSISTENT WITH PREVIOUSLY REPORTED AND CALLED VALUE.    Culture (A)  Final    METHICILLIN RESISTANT STAPHYLOCOCCUS AUREUS SEE SEPARATE REPORT Performed at [facility], [address] [place], [place] [address]    Report Status 01/13/2021 FINAL  Final   Organism ID, Bacteria METHICILLIN RESISTANT STAPHYLOCOCCUS AUREUS  Final      Susceptibility   Methicillin resistant staphylococcus aureus - MIC*    CIPROFLOXACIN >=8 RESISTANT Resistant     ERYTHROMYCIN >=8 RESISTANT Resistant     GENTAMICIN <=0.5 SENSITIVE Sensitive     OXACILLIN >=4 RESISTANT Resistant     TETRACYCLINE <=1 SENSITIVE Sensitive     VANCOMYCIN 1 SENSITIVE Sensitive     TRIMETH/SULFA >=320 RESISTANT Resistant     CLINDAMYCIN <=0.25 SENSITIVE Sensitive     RIFAMPIN <=0.5 SENSITIVE Sensitive     Inducible Clindamycin NEGATIVE Sensitive     * METHICILLIN RESISTANT STAPHYLOCOCCUS AUREUS  Blood culture (routine x 2)     Status: Abnormal   Collection Time: 01/05/21  6:10 PM   Specimen: BLOOD  Result Value Ref Range Status   Specimen Description BLOOD SITE NOT SPECIFIED  Final   Special Requests   Final    BOTTLES DRAWN AEROBIC AND ANAEROBIC Blood Culture results may not be optimal due to an inadequate volume of blood received in  culture bottles   Culture  Setup Time   Final    GRAM POSITIVE COCCI IN BOTH AEROBIC AND ANAEROBIC BOTTLES CRITICAL VALUE NOTED.  VALUE IS CONSISTENT WITH PREVIOUSLY REPORTED AND CALLED VALUE.    Culture (A)  Final    STAPHYLOCOCCUS AUREUS SUSCEPTIBILITIES PERFORMED ON PREVIOUS CULTURE WITHIN THE LAST 5 DAYS. Performed at [facility], [address], [place], [place] [address]    Report Status 01/08/2021 FINAL  Final  Resp Panel by RT-PCR (Flu A&B, Covid) Nasopharyngeal Swab     Status: None   Collection Time: 01/05/21  9:32 PM   Specimen: Nasopharyngeal Swab; Nasopharyngeal(NP) swabs in vial transport medium  Result Value Ref Range Status   SARS Coronavirus 2 by RT PCR NEGATIVE NEGATIVE Final    Comment: (NOTE) SARS-CoV-2 target nucleic acids are NOT DETECTED.  The SARS-CoV-2 RNA is generally detectable in upper respiratory specimens during the acute phase of infection. The lowest concentration of SARS-CoV-2 viral copies this assay can detect is 138 copies/mL. A negative result does not preclude SARS-Cov-2 infection and should not be used as the sole basis for treatment or other patient management decisions. A negative result may occur with  improper specimen collection/handling, submission of specimen other than nasopharyngeal swab, presence of viral mutation(s) within the areas targeted  by this assay, and inadequate number of viral copies(<138 copies/mL). A negative result must be combined with clinical observations, patient history, and epidemiological information. The expected result is Negative.  Fact Sheet for Patients:  BloggerCourse.com  Fact Sheet for Healthcare Providers:  SeriousBroker.it  This test is no t yet approved or cleared by the [place] FDA and  has been authorized for detection and/or diagnosis of SARS-CoV-2 by FDA under an Emergency Use Authorization (EUA). This EUA will remain  in effect  (meaning this test can be used) for the duration of the COVID-19 declaration under Section 564(b)(1) of the Act, 21 U.S.C.section 360bbb-3(b)(1), unless the authorization is terminated  or revoked sooner.       Influenza A by PCR NEGATIVE NEGATIVE Final   Influenza B by PCR NEGATIVE NEGATIVE Final    Comment: (NOTE) The Xpert Xpress SARS-CoV-2/FLU/RSV plus assay is intended as an aid in the diagnosis of influenza from Nasopharyngeal swab specimens and should not be used as a sole basis for treatment. Nasal washings and aspirates are unacceptable for Xpert Xpress SARS-CoV-2/FLU/RSV testing.  Fact Sheet for Patients: BloggerCourse.com  Fact Sheet for Healthcare Providers: SeriousBroker.it  This test is not yet approved or cleared by the [place] FDA and has been authorized for detection and/or diagnosis of SARS-CoV-2 by FDA under an Emergency Use Authorization (EUA). This EUA will remain in effect (meaning this test can be used) for the duration of the COVID-19 declaration under Section 564(b)(1) of the Act, 21 U.S.C. section 360bbb-3(b)(1), unless the authorization is terminated or revoked.  Performed at [facility], [address] [place], [place] [address]   Culture, blood (routine x 2)     Status: Abnormal   Collection Time: 01/07/21  6:52 AM   Specimen: BLOOD  Result Value Ref Range Status   Specimen Description BLOOD RIGHT ANTECUBITAL  Final   Special Requests   Final    BOTTLES DRAWN AEROBIC AND ANAEROBIC Blood Culture adequate volume   Culture  Setup Time   Final    GRAM POSITIVE COCCI IN CLUSTERS ANAEROBIC BOTTLE ONLY CRITICAL VALUE NOTED.  VALUE IS CONSISTENT WITH PREVIOUSLY REPORTED AND CALLED VALUE.    Culture (A)  Final    STAPHYLOCOCCUS AUREUS SUSCEPTIBILITIES PERFORMED ON PREVIOUS CULTURE WITHIN THE LAST 5 DAYS. Performed at [facility], [address] [place], [place] [address]     Report Status 01/10/2021 FINAL  Final  Culture, blood (routine x 2)     Status: None   Collection Time: 01/07/21  6:52 AM   Specimen: BLOOD RIGHT HAND  Result Value Ref Range Status   Specimen Description BLOOD RIGHT HAND  Final   Special Requests   Final    BOTTLES DRAWN AEROBIC ONLY Blood Culture results may not be optimal due to an inadequate volume of blood received in culture bottles   Culture   Final    NO GROWTH 5 DAYS Performed at [facility], [address] [place], [place] [address]    Report Status 01/12/2021 FINAL  Final  Aerobic/Anaerobic Culture w Gram Stain (surgical/deep wound)     Status: None   Collection Time: 01/08/21 10:10 AM   Specimen: Wound  Result Value Ref Range Status   Specimen Description WOUND  Final   Special Requests NONE  Final   Gram Stain   Final    ABUNDANT WBC PRESENT, PREDOMINANTLY PMN NO ORGANISMS SEEN    Culture   Final    No  growth aerobically or anaerobically. Performed at [facility], [address], [place], [place] [address]    Report Status 01/13/2021 FINAL  Final  Culture, blood (Routine X 2) w Reflex to ID Panel     Status: Abnormal   Collection Time: 01/09/21 10:26 AM   Specimen: BLOOD  Result Value Ref Range Status   Specimen Description BLOOD RIGHT ANTECUBITAL  Final   Special Requests   Final    BOTTLES DRAWN AEROBIC AND ANAEROBIC Blood Culture adequate volume   Culture  Setup Time   Final    GRAM POSITIVE COCCI IN CLUSTERS ANAEROBIC BOTTLE ONLY CRITICAL VALUE NOTED.  VALUE IS CONSISTENT WITH PREVIOUSLY REPORTED AND CALLED VALUE.    Culture (A)  Final    STAPHYLOCOCCUS AUREUS SUSCEPTIBILITIES PERFORMED ON PREVIOUS CULTURE WITHIN THE LAST 5 DAYS. Performed at [facility], [address], [place], [place] [address]    Report Status 01/12/2021 FINAL  Final  Culture, blood (Routine X 2) w Reflex to ID Panel     Status: Abnormal   Collection Time: 01/09/21 10:49 AM   Specimen: BLOOD LEFT HAND  Result  Value Ref Range Status   Specimen Description BLOOD LEFT HAND  Final   Special Requests   Final    BOTTLES DRAWN AEROBIC AND ANAEROBIC Blood Culture results may not be optimal due to an inadequate volume of blood received in culture bottles   Culture  Setup Time   Final    GRAM POSITIVE COCCI IN CLUSTERS IN BOTH AEROBIC AND ANAEROBIC BOTTLES CRITICAL VALUE NOTED.  VALUE IS CONSISTENT WITH PREVIOUSLY REPORTED AND CALLED VALUE.    Culture (A)  Final    STAPHYLOCOCCUS AUREUS SUSCEPTIBILITIES PERFORMED ON PREVIOUS CULTURE WITHIN THE LAST 5 DAYS. Performed at [facility], [address] [place], [place] [address]    Report Status 01/12/2021 FINAL  Final  Culture, blood (routine x 2)     Status: Abnormal   Collection Time: 01/11/21  2:02 PM   Specimen: BLOOD RIGHT HAND  Result Value Ref Range Status   Specimen Description BLOOD RIGHT HAND  Final   Special Requests   Final    BOTTLES DRAWN AEROBIC AND ANAEROBIC Blood Culture adequate volume   Culture  Setup Time   Final    GRAM POSITIVE COCCI IN CLUSTERS IN BOTH AEROBIC AND ANAEROBIC BOTTLES CRITICAL RESULT CALLED TO, READ BACK BY AND VERIFIED WITH: J. LEDFORD PHARMD, AT 0630 01/12/21 D. VANHOOK    Culture (A)  Final    STAPHYLOCOCCUS AUREUS SUSCEPTIBILITIES PERFORMED ON PREVIOUS CULTURE WITHIN THE LAST 5 DAYS. Performed at [facility], [address] [place], [place] [address]    Report Status 01/14/2021 FINAL  Final  Culture, blood (routine x 2)     Status: Abnormal   Collection Time: 01/11/21  2:03 PM   Specimen: BLOOD LEFT HAND  Result Value Ref Range Status   Specimen Description BLOOD LEFT HAND  Final   Special Requests   Final    BOTTLES DRAWN AEROBIC AND ANAEROBIC Blood Culture adequate volume   Culture  Setup Time   Final    GRAM POSITIVE COCCI IN CLUSTERS ANAEROBIC BOTTLE ONLY CRITICAL VALUE NOTED.  VALUE IS CONSISTENT WITH PREVIOUSLY REPORTED AND CALLED VALUE. Performed at [facility], [address] [place], [place] [address]    Culture METHICILLIN RESISTANT STAPHYLOCOCCUS AUREUS (A)  Final   Report Status 01/14/2021 FINAL  Final   Organism ID, Bacteria  METHICILLIN RESISTANT STAPHYLOCOCCUS AUREUS  Final      Susceptibility   Methicillin resistant staphylococcus aureus - MIC*    CIPROFLOXACIN >=8 RESISTANT Resistant     ERYTHROMYCIN >=8 RESISTANT Resistant     GENTAMICIN <=0.5 SENSITIVE Sensitive     OXACILLIN >=4 RESISTANT Resistant     TETRACYCLINE <=1 SENSITIVE Sensitive     VANCOMYCIN <=0.5 SENSITIVE Sensitive     TRIMETH/SULFA >=320 RESISTANT Resistant     CLINDAMYCIN <=0.25 SENSITIVE Sensitive     RIFAMPIN <=0.5 SENSITIVE Sensitive     Inducible Clindamycin NEGATIVE Sensitive     * METHICILLIN RESISTANT STAPHYLOCOCCUS AUREUS  Culture, blood (routine x 2)     Status: Abnormal   Collection Time: 01/12/21 11:47 AM   Specimen: BLOOD RIGHT HAND  Result Value Ref Range Status   Specimen Description BLOOD RIGHT HAND  Final   Special Requests   Final    BOTTLES DRAWN AEROBIC AND ANAEROBIC Blood Culture results may not be optimal due to an inadequate volume of blood received in culture bottles   Culture  Setup Time   Final    GRAM POSITIVE COCCI IN CLUSTERS IN BOTH AEROBIC AND ANAEROBIC BOTTLES CRITICAL VALUE NOTED.  VALUE IS CONSISTENT WITH PREVIOUSLY REPORTED AND CALLED VALUE.    Culture (A)  Final    STAPHYLOCOCCUS AUREUS SUSCEPTIBILITIES PERFORMED ON PREVIOUS CULTURE WITHIN THE LAST 5 DAYS. Performed at [facility], [address], [place], [place] [address]    Report Status 01/15/2021 FINAL  Final  Culture, blood (routine x 2)     Status: None (Preliminary result)   Collection Time: 01/12/21 11:52 AM   Specimen: BLOOD LEFT HAND  Result Value Ref Range Status   Specimen Description BLOOD LEFT HAND  Final   Special Requests   Final    BOTTLES DRAWN AEROBIC ONLY Blood Culture adequate volume   Culture   Final    NO GROWTH 3 DAYS Performed at [facility], [address], [place], [place] [address]    Report Status PENDING  Incomplete  Culture, blood (routine x 2)     Status: None (Preliminary result)   Collection Time: 01/14/21  5:42 AM   Specimen: BLOOD RIGHT HAND  Result Value Ref Range Status   Specimen Description BLOOD RIGHT HAND  Final   Special Requests   Final    BOTTLES DRAWN AEROBIC AND ANAEROBIC Blood Culture adequate volume   Culture   Final    NO GROWTH 1 DAY Performed at [facility], [address], [place], [place] [address]    Report Status PENDING  Incomplete  Culture, blood (routine x 2)     Status: None (Preliminary result)   Collection Time: 01/14/21  5:42 AM   Specimen: BLOOD LEFT HAND  Result Value Ref Range Status   Specimen Description BLOOD LEFT HAND  Final   Special Requests   Final    BOTTLES DRAWN AEROBIC AND ANAEROBIC Blood Culture adequate volume   Culture   Final    NO GROWTH 1 DAY Performed at [facility], [address] [place], [place] [address]    Report Status PENDING  Incomplete     Radiology Studies: No results found. CT ABDOMEN PELVIS W CONTRAST  Final Result    CT PELVIS W CONTRAST  Final Result    CT HEAD WO CONTRAST  Final Result    MR BRAIN W WO CONTRAST  Final Result    MR  Lumbar Spine W Wo Contrast  Final Result    MR THORACIC SPINE W WO CONTRAST  Final Result    [place] FNA SOFT TISSUE    (Results Pending)    Scheduled Meds:  aspirin EC  81 mg Oral Daily   atorvastatin  40 mg Oral QHS   enoxaparin (LOVENOX) injection  40 mg Subcutaneous QHS   feeding supplement  237 mL Oral TID BM   furosemide  40 mg Intravenous Daily   [START ON 01/16/2021] furosemide  40 mg Intravenous Daily   gabapentin  200 mg Oral TID   insulin aspart  0-9 Units Subcutaneous Q4H   lactulose  10 g Oral Daily   mouth rinse  15 mL Mouth Rinse BID   metoprolol succinate  25 mg Oral Daily   multivitamin with minerals  1 tablet Oral Daily   nystatin  5 mL Oral QID    oxyCODONE  20 mg Oral Q12H   pantoprazole (PROTONIX) IV  40 mg Intravenous BID   polyethylene glycol  17 g Oral BID   venlafaxine XR  75 mg Oral q morning   PRN Meds: acetaminophen **OR** [DISCONTINUED] acetaminophen, ALPRAZolam, alum & mag hydroxide-simeth, cyclobenzaprine, diclofenac Sodium, HYDROmorphone (DILAUDID) injection, labetalol, lidocaine, ondansetron **OR** ondansetron (ZOFRAN) IV, promethazine (PHENERGAN) injection (IM or IVPB) Continuous Infusions:  albumin human 25 g (01/15/21 1127)   ceFTAROline (TEFLARO) IV 600 mg (01/15/21 0859)   potassium chloride 10 mEq (01/15/21 1256)   promethazine (PHENERGAN) injection (IM or IVPB) 12.5 mg (01/15/21 1129)   [START ON 01/16/2021] vancomycin       LOS: 9 days  Time spent: Greater than 50% of the 35 minute visit was spent in counseling/coordination of care for the patient as laid out in the A&P.   Lewie Chamber, MD [facility] 01/15/2021, 2:55 PM

## 2021-01-15 NOTE — Progress Notes (Signed)
Pharmacy Antibiotic Note  Louis [name] is a 55 y.o. male admitted on 01/05/2021 with Sepsis with epidural abscess with MRSA bacteremia/lumbar osteomyelitis.  Pharmacy has been consulted for Vanco dosing.  ID: Sepsis with epidural abscess with MRSA bacteremia/lumbar osteomyelitis Changed Cubicin to Vanc d/t worsening sx and +BCx. Daptomycin resistant MRSA>>back to Vanco S/p aspiration of lumbar paraspinal collection on 7/16 Fluconazole 100 mg x 7 days for oral thrush + Nystatin - negative TTE and TEE - Tmax 99.3, WBC 31>21.5 7/19 TEE: no vegetations   Cubicin PTA >> 7/14 (initial plan through 8/10, last OPAT 7/13); 7/20 >>7/21 Ceftaroline 7/20 >> Vanc 7/14 >>7/20; 7/21>> Fluconazole 7/17>> 7/23  7/14 CK = 32   7/13 BCx - 3/4 MRSA 7/15 Bcx: staph aureus  7/16 wound cx - ngtd  7/17 Bcx- 3/4 MRSA 7/19 Bcx: MRSA  7/20 Bcx: staph aureus  7/18:   Vanc Trough =9 (drawn late), Vanc Peak = 22 Calculated AUC = 385 on 1500/12: Incr to 1250/8h 7/23: VP 28, VT 21= AUC 575: Chg dose to 1750/12h for AUC 536  Plan: Change Vancomycin to 1750mg  IV q12 hrs for new anticipated AUC 536 Ceftaroline 600mg  IV q8hr    Weight: 94.3 kg (208 lb)  Temp (24hrs), Avg:98 F (36.7 C), Min:97.8 F (36.6 C), Max:98.2 F (36.8 C)  Recent Labs  Lab 01/09/21 2141 01/10/21 0731 01/10/21 1001 01/11/21 0226 01/12/21 0330 01/13/21 0846 01/14/21 0542 01/15/21 0725 01/15/21 1137  WBC 32.1*  --    < > 26.5* 21.5* 21.3* 12.2* 19.1*  --   CREATININE 0.53*  --    < > 0.38* 0.49* 0.42* 0.50* 0.48*  --   VANCOTROUGH  --  9*  --   --   --   --   --   --  21*  VANCOPEAK 22*  --   --   --   --   --   --  28*  --    < > = values in this interval not displayed.    Estimated Creatinine Clearance: 119.5 mL/min (A) (by C-G formula based on SCr of 0.48 mg/dL (L)).    No Known Allergies  Thank you for allowing pharmacy to be a part of this patient's care.   Lilyanah Celestin S. 01/17/21, PharmD, BCPS Clinical Staff  Pharmacist [email or web address] 01/17/21 01/15/2021 1:02 PM

## 2021-01-16 DIAGNOSIS — M4620 Osteomyelitis of vertebra, site unspecified: Secondary | ICD-10-CM | POA: Diagnosis not present

## 2021-01-16 DIAGNOSIS — E43 Unspecified severe protein-calorie malnutrition: Secondary | ICD-10-CM | POA: Diagnosis not present

## 2021-01-16 DIAGNOSIS — A419 Sepsis, unspecified organism: Secondary | ICD-10-CM | POA: Diagnosis not present

## 2021-01-16 DIAGNOSIS — G062 Extradural and subdural abscess, unspecified: Secondary | ICD-10-CM | POA: Diagnosis not present

## 2021-01-16 LAB — CBC WITH DIFFERENTIAL/PLATELET
Abs Immature Granulocytes: 0.1 10*3/uL — ABNORMAL HIGH (ref 0.00–0.07)
Basophils Absolute: 0 10*3/uL (ref 0.0–0.1)
Basophils Relative: 0 %
Eosinophils Absolute: 0.1 10*3/uL (ref 0.0–0.5)
Eosinophils Relative: 1 %
HCT: 25.7 % — ABNORMAL LOW (ref 39.0–52.0)
Hemoglobin: 8.3 g/dL — ABNORMAL LOW (ref 13.0–17.0)
Immature Granulocytes: 1 %
Lymphocytes Relative: 10 %
Lymphs Abs: 1.3 10*3/uL (ref 0.7–4.0)
MCH: 28.6 pg (ref 26.0–34.0)
MCHC: 32.3 g/dL (ref 30.0–36.0)
MCV: 88.6 fL (ref 80.0–100.0)
Monocytes Absolute: 1.3 10*3/uL — ABNORMAL HIGH (ref 0.1–1.0)
Monocytes Relative: 9 %
Neutro Abs: 11 10*3/uL — ABNORMAL HIGH (ref 1.7–7.7)
Neutrophils Relative %: 79 %
Platelets: 564 10*3/uL — ABNORMAL HIGH (ref 150–400)
RBC: 2.9 MIL/uL — ABNORMAL LOW (ref 4.22–5.81)
RDW: 15.8 % — ABNORMAL HIGH (ref 11.5–15.5)
WBC: 13.8 10*3/uL — ABNORMAL HIGH (ref 4.0–10.5)
nRBC: 0 % (ref 0.0–0.2)

## 2021-01-16 LAB — COMPREHENSIVE METABOLIC PANEL
ALT: 10 U/L (ref 0–44)
AST: 10 U/L — ABNORMAL LOW (ref 15–41)
Albumin: 3.2 g/dL — ABNORMAL LOW (ref 3.5–5.0)
Alkaline Phosphatase: 67 U/L (ref 38–126)
Anion gap: 7 (ref 5–15)
BUN: <5 mg/dL — ABNORMAL LOW (ref 6–20)
CO2: 32 mmol/L (ref 22–32)
Calcium: 8.8 mg/dL — ABNORMAL LOW (ref 8.9–10.3)
Chloride: 97 mmol/L — ABNORMAL LOW (ref 98–111)
Creatinine, Ser: 0.44 mg/dL — ABNORMAL LOW (ref 0.61–1.24)
GFR, Estimated: >60 mL/min (ref >60)
Glucose, Bld: 191 mg/dL — ABNORMAL HIGH (ref 70–99)
Potassium: 3.4 mmol/L — ABNORMAL LOW (ref 3.5–5.1)
Sodium: 136 mmol/L (ref 135–145)
Total Bilirubin: 0.7 mg/dL (ref 0.3–1.2)
Total Protein: 6.2 g/dL — ABNORMAL LOW (ref 6.5–8.1)

## 2021-01-16 LAB — GLUCOSE, CAPILLARY
Glucose-Capillary: 208 mg/dL — ABNORMAL HIGH (ref 70–99)
Glucose-Capillary: 201 mg/dL — ABNORMAL HIGH (ref 70–99)
Glucose-Capillary: 187 mg/dL — ABNORMAL HIGH (ref 70–99)
Glucose-Capillary: 197 mg/dL — ABNORMAL HIGH (ref 70–99)
Glucose-Capillary: 217 mg/dL — ABNORMAL HIGH (ref 70–99)
Glucose-Capillary: 261 mg/dL — ABNORMAL HIGH (ref 70–99)
Glucose-Capillary: 216 mg/dL — ABNORMAL HIGH (ref 70–99)

## 2021-01-16 LAB — MAGNESIUM: Magnesium: 1.9 mg/dL (ref 1.7–2.4)

## 2021-01-16 LAB — PHOSPHORUS: Phosphorus: 2.2 mg/dL — ABNORMAL LOW (ref 2.5–4.6)

## 2021-01-16 MED ORDER — POTASSIUM PHOSPHATES 15 MMOLE/5ML IV SOLN
20.0000 mmol | Freq: Once | INTRAVENOUS | Status: AC
  Administered 2021-01-16: 20 mmol via INTRAVENOUS
  Filled 2021-01-16: qty 6.67

## 2021-01-16 NOTE — Plan of Care (Signed)

## 2021-01-16 NOTE — Progress Notes (Signed)
Progress Note    Louis [name]   JAS:[id number]  DOB: October 21, 1965  DOA: 01/05/2021     10  PCP: Bobette Mo, NP  CC: back pain  Hospital Course: Mr. Louis [name] is a 55 year old Caucasian male with PMH significant for but not limited to DMII, HTN, CAD s/p CABG 2017, anxiety, chronic pain as well as other comorbidities with recent admission with an epidural abscess, vertebral osteomyelitis, psoas abscess and MRSA bacteremia who returned to the ED with worsening back pain.    He reported that his back pain has been stable until approximately 5 days PTA when it became more severe and progressively worsened since.  He did not notice any new leg numbness or weakness, but did have some urinary incontinence.  Back pain was reportedly severe and constant; worse with movement and radiating to both legs.   He denied any fevers or chills but stated he did have some mild drainage from his surgical wounds.   He was admitted on 12/12/2020 after found to have an epidural abscess and vertebral osteomyelitis. He underwent a laminectomy and evacuation of abscess on 12/13/2020 and then drainage of the psoas abscess by IR on 12/22/2020.  Blood cultures from 12/13/2020 grew out MRSA but there is no vegetation seen on TTE and blood cultures were drawn and repeated 12/15/2020 remain negative.   He has been on daptomycin at home. On admission, WBC 23.9, PLTC 681k.  COVID PCR was negative.   He had an MRI of the thoracic and lumbar spine which revealed multiple fluid collections involving the ventral epidural abscess at the L4-L5 level with progressive discitis and osteomyelitis at the L4-L5 and L5-S1 levels.  Blood cultures were repeated in the ED.  Initial plan was for lumbar decompression however Dr. Jake Samples of neurosurgery evaluated and felt that his thoracic mass-effect was improved and that even though there was persistent ostial discitis and epidural abscess throughout the thoracolumbar spine there is possibly a more loculated  focus of the abscess at L4-L5 with stenosis but no focal weakness, groin numbness or numbness and tingling in his legs he did not believe that urgent or emergent decompression was needed at that time.   Patient has been confused more often per his wife and neurosurgery is obtaining MRI of the brain with and without contrast given concern of some cerebral spread of infection.  Because he has failed multiple medical treatments will likely need broaden antibiotic therapy and will consult ID and neurosurgery feels that the stenosis L4-L5 could be decompressed however neurologically patient is doing well with no focal deficits and therefore they are recommending aggressive medical and supportive care.   ID changed to IV Vancomycin and consulted IR for drainage of the deep soft tissue collection with peripheral enhancement measuring 4.7 x 5.0 x 1.7 cm.  Dr. Jake Samples of neurosurgery recommending continuing vancomycin and aggressive medical care.   Patient had some nausea and an abnormal EKG so cardiology was consulted for further evaluation and stat troponins were ordered and they were negative.  Cardiology evaluated and felt that we should repeat a limited echo.  Limited echo done and showed no wall motion abnormalities.  There is no evidence of STEMI.   The patient is to undergo a TEE on 01/11/2021.  ID recommending following blood culture susceptibility.  Per ID if WBC remains high and the susceptibility indicates daptomycin remain sensitive they are recommending that we need to discuss with neurosurgery given regarding debridement for source control.  Interval History:  No events  overnight.  No longer complaining of severe nausea/vomiting.  The last 2 days he has had an emesis basin next to him and this morning he does not.  Still not taking in hardly any oral nutrition; discussed placement of feeding tube tomorrow which he is okay with.  He was very lethargic and tired this morning when  seen.  ROS: Constitutional: negative for chills and fevers, Respiratory: negative for cough and sputum, Cardiovascular: negative for chest pain, and Gastrointestinal: negative for abdominal pain  Assessment & Plan: * Sepsis (HCC)-resolved as of 01/13/2021 - initially: tachycardic, tachypneic, leukocytosis; epidural abscess - see bacteremia and abscess/discitis  Discitis - see abscess  Epidural abscess - s/p laminectomy & evacuation of abscess 12/21/20 - treated with Dapto; developed worsening back pain; MRI L- and T-spine to have multiple fluid collections including ventral epidural abscess and progressive osteomyelitis at L 4-5 - daptomycin MIC was non-susceptible on lab send off from 7/13; might explain why no clearance of bacteremia vs source control - negative TTE and TEE - persistently positive blood cultures, MRSA (see bacteremia) -Neurosurgery also following.  Case rediscussed on 01/12/2021 -Repeat CT abdomen/pelvis being performed on 01/12/2021: no discrete fluid collections - now on dual treatment with vanc and ceftaroline per ID for double coverage; no further plans for surgery still at this time; hopeful that abx will treat infection as a whole  MRSA bacteremia - persistently bacteremic - follow up repeat blood cx 7/22: Negative x 2 days so far - blood cx also repeated on 7/24 per ID - continue vanc and ceftaroline - ID following - see epidural abscess  Vertebral osteomyelitis (HCC) - ongoing erosion noted on repeat CT 7/20 of L3 and L4 vertebral bodies; no pathologic fracture  Protein-calorie malnutrition, severe - Patient's BMI is Body mass index is 28.56 kg/m.. - Patient has the following signs/symptoms consistent with PCM: (fat loss, muscle loss, muscle wasting). - seen by RD, appreciate assistance. Continue plan per RD -Prealbumin 5.9 mg/dL - too much N/V on 1/61; poor intake; may need cortrak to start TF but on hold for now until N/V improves; unable to place CVL  for TPN due to persistent bacteremia at this time as well  - oral intake remains severely poor. Plan is to consult for cortrak placement on 7/25; his N/V seems better so can hold off on NGT/suction for now. Need to start TF once cortrak in place; patient aware of plan, understands, and in agreement   Acute metabolic encephalopathy-resolved as of 01/13/2021 - patient symptoms include intermittent confusion - etiology considered due to metabolic and infectious etiologies - Mentation has been slowly improving - MRI brain also performed; motion degraded.  Mild generalized atrophy, chronic small vessel disease -Noted that patient was also in significant pain on admission which may have also contributed as well as treatment with pain medications  DMII (diabetes mellitus, type 2) (HCC) - A1c was 9.1% in June 2022  - Check CBGs and use SSI for now - diet liberalized in setting of malnutrition; may need to adjust insulin if CBGs elevate  Fall-resolved as of 01/13/2021 -Despite severe weakness, patient fell out of bed on 01/10/2021 -Repeat head CT showed no acute intracranial abnormalities noted -Fall Precautions -Waist belt for safety if needed  Cholestasis - Cholestasis of sepsis -Likely reactive and due to infection - trend CMP  GERD (gastroesophageal reflux disease) - continue protonix   Oral thrush - Continue nystatin oral suspension - Completed oral fluconazole x 7-day course  Hypokalemia -Replete and recheck  as needed  Hypomagnesemia -Replete and recheck as needed  Thrombocytosis -Likely reactive in the setting of infection -continue daily CBC  Abnormal EKG -Patient denied Chest Pain but had Nausea but no vomiting -Had ST Elevation in leads V1-V2 without Reciprocal changes -Troponin Checked and went from 24 -> 31 -Cardiology Consulted and checked Limited ECHO -ECHO showed no WMA -Cardiology does not feel he has a STEMI and thinks that his ST elevations are more pronounced  in the setting of pain and hypertension  Chronic diastolic CHF (congestive heart failure) (HCC) - monitoring fluid status and renal function - currently on lasix and albumin  - strict I&O  Anxiety -Continue Venlafaxine XR 75 mg daily and PRN Xanax  Hyponatremia -Appears chronic, possibly related to his SNRI but may also be hypovolemia from infection/fever/insensible losses -Checked urine sodium was 14 and Urine Osm was 484 -BMP daily  Normocytic anemia -Likely multifactorial in setting of ongoing infection, multiple phlebotomy draws - No overt bleeding  -Checked anemia panel and showed iron level of 19, U IBC 139, TIBC 158, saturation ratios of 12%, ferritin level 864, folate of 2.5, vitamin B12 level 1070 -Continue to monitor for signs and symptoms of bleeding -Continue trending CBC - Hgb drop: 11.6>>8.6 g/dL on 2/95. Vomitus was bilious when seen; check FOBT. At risk for stress ulcer. Start PPI IV BID. Trend CBC  HTN (hypertension) - continue Toprol  - BP otherwise has been stable   CAD (coronary artery disease) - No anginal complaints - s/p CABG -He had a LIMA to LAD, vein graft to distal circumflex and vein graft RCA in 2017. -His catheterization in March 2020 demonstrated patent vein grafts -Continue aspirin, statin, BB - hold ARB due to low/normal BP  Increased anion gap metabolic acidosis-resolved as of 01/13/2021 - s/p treatment  Old records reviewed in assessment of this patient  Antimicrobials: Vancomycin 01/06/2021 >> 01/12/2021 Fluconazole 01/09/2021 >> 01/15/2021 Daptomycin Ceftaroline 7/20 >> current Vanc 7/21 >> current  DVT prophylaxis: enoxaparin (LOVENOX) injection 40 mg Start: 01/09/21 1500 SCDs Start: 01/06/21 0224   Code Status:   Code Status: Full Code Family Communication: Wife  Disposition Plan: Status is: Inpatient  Remains inpatient appropriate because:Ongoing active pain requiring inpatient pain management, Ongoing diagnostic testing  needed not appropriate for outpatient work up, IV treatments appropriate due to intensity of illness or inability to take PO, and Inpatient level of care appropriate due to severity of illness  Dispo: The patient is from: Home              Anticipated d/c is to: Home              Patient currently is not medically stable to d/c.   Difficult to place patient No  Risk of unplanned readmission score: Unplanned Admission- Pilot do not use: 33.64   Objective: Blood pressure (!) 165/94, pulse (!) 101, temperature 97.6 F (36.4 C), temperature source Oral, resp. rate 16, weight 94.3 kg, SpO2 95 %.  Examination: General appearance:  Fatigued appearing adult man lying in bed who appears a little more comfortable than previously seen and has no active vomiting Head: Normocephalic, without obvious abnormality, atraumatic Eyes:  EOMI Lungs: clear to auscultation bilaterally Heart: regular rate and rhythm and S1, S2 normal Abdomen: normal findings: bowel sounds normal and soft, non-tender Extremities: posterior thigh compartments noted with edema and abdominal wall Skin: mobility and turgor normal Neurologic: Weak throughout (worse in LE) but no focal deficits appreciated  Consultants:  ID Neurosurgery  Cardiology IR  Procedures:  T7-8, T12-L1, L3-L4 laminectomy for Epidural Abscess, 6/28 Left psoas muscle abscess drainage, 6/30 Paraspinal abscess drainage, 7/16  Data Reviewed: I have personally reviewed following labs and imaging studies Results for orders placed or performed during the hospital encounter of 01/05/21 (from the past 24 hour(s))  Glucose, capillary     Status: Abnormal   Collection Time: 01/15/21  3:50 PM  Result Value Ref Range   Glucose-Capillary 211 (H) 70 - 99 mg/dL  Glucose, capillary     Status: Abnormal   Collection Time: 01/15/21  8:06 PM  Result Value Ref Range   Glucose-Capillary 172 (H) 70 - 99 mg/dL   Comment 1 Notify RN    Comment 2 Document in Chart    Glucose, capillary     Status: Abnormal   Collection Time: 01/15/21 11:32 PM  Result Value Ref Range   Glucose-Capillary 195 (H) 70 - 99 mg/dL   Comment 1 Notify RN    Comment 2 Document in Chart   Glucose, capillary     Status: Abnormal   Collection Time: 01/16/21  4:12 AM  Result Value Ref Range   Glucose-Capillary 208 (H) 70 - 99 mg/dL   Comment 1 Notify RN    Comment 2 Document in Chart   CBC with Differential/Platelet     Status: Abnormal   Collection Time: 01/16/21  6:43 AM  Result Value Ref Range   WBC 13.8 (H) 4.0 - 10.5 K/uL   RBC 2.90 (L) 4.22 - 5.81 MIL/uL   Hemoglobin 8.3 (L) 13.0 - 17.0 g/dL   HCT 16.125.7 (L) 09.639.0 - 04.552.0 %   MCV 88.6 80.0 - 100.0 fL   MCH 28.6 26.0 - 34.0 pg   MCHC 32.3 30.0 - 36.0 g/dL   RDW 40.915.8 (H) 81.111.5 - 91.415.5 %   Platelets 564 (H) 150 - 400 K/uL   nRBC 0.0 0.0 - 0.2 %   Neutrophils Relative % 79 %   Neutro Abs 11.0 (H) 1.7 - 7.7 K/uL   Lymphocytes Relative 10 %   Lymphs Abs 1.3 0.7 - 4.0 K/uL   Monocytes Relative 9 %   Monocytes Absolute 1.3 (H) 0.1 - 1.0 K/uL   Eosinophils Relative 1 %   Eosinophils Absolute 0.1 0.0 - 0.5 K/uL   Basophils Relative 0 %   Basophils Absolute 0.0 0.0 - 0.1 K/uL   Immature Granulocytes 1 %   Abs Immature Granulocytes 0.10 (H) 0.00 - 0.07 K/uL  Magnesium     Status: None   Collection Time: 01/16/21  6:43 AM  Result Value Ref Range   Magnesium 1.9 1.7 - 2.4 mg/dL  Comprehensive metabolic panel     Status: Abnormal   Collection Time: 01/16/21  6:43 AM  Result Value Ref Range   Sodium 136 135 - 145 mmol/L   Potassium 3.4 (L) 3.5 - 5.1 mmol/L   Chloride 97 (L) 98 - 111 mmol/L   CO2 32 22 - 32 mmol/L   Glucose, Bld 191 (H) 70 - 99 mg/dL   BUN <5 (L) 6 - 20 mg/dL   Creatinine, Ser 7.820.44 (L) 0.61 - 1.24 mg/dL   Calcium 8.8 (L) 8.9 - 10.3 mg/dL   Total Protein 6.2 (L) 6.5 - 8.1 g/dL   Albumin 3.2 (L) 3.5 - 5.0 g/dL   AST 10 (L) 15 - 41 U/L   ALT 10 0 - 44 U/L   Alkaline Phosphatase 67 38 - 126 U/L   Total  Bilirubin 0.7  0.3 - 1.2 mg/dL   GFR, Estimated >63 >01 mL/min   Anion gap 7 5 - 15  Phosphorus     Status: Abnormal   Collection Time: 01/16/21  6:43 AM  Result Value Ref Range   Phosphorus 2.2 (L) 2.5 - 4.6 mg/dL  Glucose, capillary     Status: Abnormal   Collection Time: 01/16/21  8:14 AM  Result Value Ref Range   Glucose-Capillary 201 (H) 70 - 99 mg/dL  Glucose, capillary     Status: Abnormal   Collection Time: 01/16/21  9:01 AM  Result Value Ref Range   Glucose-Capillary 187 (H) 70 - 99 mg/dL  Glucose, capillary     Status: Abnormal   Collection Time: 01/16/21 11:35 AM  Result Value Ref Range   Glucose-Capillary 197 (H) 70 - 99 mg/dL    Recent Results (from the past 240 hour(s))  Culture, blood (routine x 2)     Status: Abnormal   Collection Time: 01/07/21  6:52 AM   Specimen: BLOOD  Result Value Ref Range Status   Specimen Description BLOOD RIGHT ANTECUBITAL  Final   Special Requests   Final    BOTTLES DRAWN AEROBIC AND ANAEROBIC Blood Culture adequate volume   Culture  Setup Time   Final    GRAM POSITIVE COCCI IN CLUSTERS ANAEROBIC BOTTLE ONLY CRITICAL VALUE NOTED.  VALUE IS CONSISTENT WITH PREVIOUSLY REPORTED AND CALLED VALUE.    Culture (A)  Final    STAPHYLOCOCCUS AUREUS SUSCEPTIBILITIES PERFORMED ON PREVIOUS CULTURE WITHIN THE LAST 5 DAYS. Performed at [facility], [address], [place], [place] [address]    Report Status 01/10/2021 FINAL  Final  Culture, blood (routine x 2)     Status: None   Collection Time: 01/07/21  6:52 AM   Specimen: BLOOD RIGHT HAND  Result Value Ref Range Status   Specimen Description BLOOD RIGHT HAND  Final   Special Requests   Final    BOTTLES DRAWN AEROBIC ONLY Blood Culture results may not be optimal due to an inadequate volume of blood received in culture bottles   Culture   Final    NO GROWTH 5 DAYS Performed at [facility], [address] [place], [place] [address]    Report Status 01/12/2021 FINAL  Final   Aerobic/Anaerobic Culture w Gram Stain (surgical/deep wound)     Status: None   Collection Time: 01/08/21 10:10 AM   Specimen: Wound  Result Value Ref Range Status   Specimen Description WOUND  Final   Special Requests NONE  Final   Gram Stain   Final    ABUNDANT WBC PRESENT, PREDOMINANTLY PMN NO ORGANISMS SEEN    Culture   Final    No growth aerobically or anaerobically. Performed at [facility], [address], [place], [place] [address]    Report Status 01/13/2021 FINAL  Final  Culture, blood (Routine X 2) w Reflex to ID Panel     Status: Abnormal   Collection Time: 01/09/21 10:26 AM   Specimen: BLOOD  Result Value Ref Range Status   Specimen Description BLOOD RIGHT ANTECUBITAL  Final   Special Requests   Final    BOTTLES DRAWN AEROBIC AND ANAEROBIC Blood Culture adequate volume   Culture  Setup Time   Final    GRAM POSITIVE COCCI IN CLUSTERS ANAEROBIC BOTTLE ONLY CRITICAL VALUE NOTED.  VALUE IS CONSISTENT WITH PREVIOUSLY REPORTED AND CALLED VALUE.    Culture (A)  Final  STAPHYLOCOCCUS AUREUS SUSCEPTIBILITIES PERFORMED ON PREVIOUS CULTURE WITHIN THE LAST 5 DAYS. Performed at [facility], [address], [place], [place] [address]    Report Status 01/12/2021 FINAL  Final  Culture, blood (Routine X 2) w Reflex to ID Panel     Status: Abnormal   Collection Time: 01/09/21 10:49 AM   Specimen: BLOOD LEFT HAND  Result Value Ref Range Status   Specimen Description BLOOD LEFT HAND  Final   Special Requests   Final    BOTTLES DRAWN AEROBIC AND ANAEROBIC Blood Culture results may not be optimal due to an inadequate volume of blood received in culture bottles   Culture  Setup Time   Final    GRAM POSITIVE COCCI IN CLUSTERS IN BOTH AEROBIC AND ANAEROBIC BOTTLES CRITICAL VALUE NOTED.  VALUE IS CONSISTENT WITH PREVIOUSLY REPORTED AND CALLED VALUE.    Culture (A)  Final    STAPHYLOCOCCUS AUREUS SUSCEPTIBILITIES PERFORMED ON PREVIOUS CULTURE WITHIN THE LAST 5  DAYS. Performed at [facility], [address], [place], [place] [address]    Report Status 01/12/2021 FINAL  Final  Culture, blood (routine x 2)     Status: Abnormal   Collection Time: 01/11/21  2:02 PM   Specimen: BLOOD RIGHT HAND  Result Value Ref Range Status   Specimen Description BLOOD RIGHT HAND  Final   Special Requests   Final    BOTTLES DRAWN AEROBIC AND ANAEROBIC Blood Culture adequate volume   Culture  Setup Time   Final    GRAM POSITIVE COCCI IN CLUSTERS IN BOTH AEROBIC AND ANAEROBIC BOTTLES CRITICAL RESULT CALLED TO, READ BACK BY AND VERIFIED WITH: J. LEDFORD PHARMD, AT 0630 01/12/21 D. VANHOOK    Culture (A)  Final    STAPHYLOCOCCUS AUREUS SUSCEPTIBILITIES PERFORMED ON PREVIOUS CULTURE WITHIN THE LAST 5 DAYS. Performed at [facility], [address], [place], [place] [address]    Report Status 01/14/2021 FINAL  Final  Culture, blood (routine x 2)     Status: Abnormal   Collection Time: 01/11/21  2:03 PM   Specimen: BLOOD LEFT HAND  Result Value Ref Range Status   Specimen Description BLOOD LEFT HAND  Final   Special Requests   Final    BOTTLES DRAWN AEROBIC AND ANAEROBIC Blood Culture adequate volume   Culture  Setup Time   Final    GRAM POSITIVE COCCI IN CLUSTERS ANAEROBIC BOTTLE ONLY CRITICAL VALUE NOTED.  VALUE IS CONSISTENT WITH PREVIOUSLY REPORTED AND CALLED VALUE. Performed at [facility], [address] [place], [place] [address]    Culture METHICILLIN RESISTANT STAPHYLOCOCCUS AUREUS (A)  Final   Report Status 01/14/2021 FINAL  Final   Organism ID, Bacteria METHICILLIN RESISTANT STAPHYLOCOCCUS AUREUS  Final      Susceptibility   Methicillin resistant staphylococcus aureus - MIC*    CIPROFLOXACIN >=8 RESISTANT Resistant     ERYTHROMYCIN >=8 RESISTANT Resistant     GENTAMICIN <=0.5 SENSITIVE Sensitive     OXACILLIN >=4 RESISTANT Resistant     TETRACYCLINE <=1 SENSITIVE Sensitive     VANCOMYCIN <=0.5 SENSITIVE Sensitive      TRIMETH/SULFA >=320 RESISTANT Resistant     CLINDAMYCIN <=0.25 SENSITIVE Sensitive     RIFAMPIN <=0.5 SENSITIVE Sensitive     Inducible Clindamycin NEGATIVE Sensitive     * METHICILLIN RESISTANT STAPHYLOCOCCUS AUREUS  Culture, blood (routine x 2)     Status: Abnormal   Collection Time: 01/12/21 11:47 AM   Specimen: BLOOD  RIGHT HAND  Result Value Ref Range Status   Specimen Description BLOOD RIGHT HAND  Final   Special Requests   Final    BOTTLES DRAWN AEROBIC AND ANAEROBIC Blood Culture results may not be optimal due to an inadequate volume of blood received in culture bottles   Culture  Setup Time   Final    GRAM POSITIVE COCCI IN CLUSTERS IN BOTH AEROBIC AND ANAEROBIC BOTTLES CRITICAL VALUE NOTED.  VALUE IS CONSISTENT WITH PREVIOUSLY REPORTED AND CALLED VALUE.    Culture (A)  Final    STAPHYLOCOCCUS AUREUS SUSCEPTIBILITIES PERFORMED ON PREVIOUS CULTURE WITHIN THE LAST 5 DAYS. Performed at [facility], [address], [place], [place] [address]    Report Status 01/15/2021 FINAL  Final  Culture, blood (routine x 2)     Status: None (Preliminary result)   Collection Time: 01/12/21 11:52 AM   Specimen: BLOOD LEFT HAND  Result Value Ref Range Status   Specimen Description BLOOD LEFT HAND  Final   Special Requests   Final    BOTTLES DRAWN AEROBIC ONLY Blood Culture adequate volume   Culture   Final    NO GROWTH 4 DAYS Performed at [facility], [address], [place], [place] [address]    Report Status PENDING  Incomplete  Culture, blood (routine x 2)     Status: None (Preliminary result)   Collection Time: 01/14/21  5:42 AM   Specimen: BLOOD RIGHT HAND  Result Value Ref Range Status   Specimen Description BLOOD RIGHT HAND  Final   Special Requests   Final    BOTTLES DRAWN AEROBIC AND ANAEROBIC Blood Culture adequate volume   Culture   Final    NO GROWTH 2 DAYS Performed at [facility], [address] [place], [place] [address]    Report Status PENDING   Incomplete  Culture, blood (routine x 2)     Status: None (Preliminary result)   Collection Time: 01/14/21  5:42 AM   Specimen: BLOOD LEFT HAND  Result Value Ref Range Status   Specimen Description BLOOD LEFT HAND  Final   Special Requests   Final    BOTTLES DRAWN AEROBIC AND ANAEROBIC Blood Culture adequate volume   Culture   Final    NO GROWTH 2 DAYS Performed at [facility], [address], [place], [place] [address]    Report Status PENDING  Incomplete     Radiology Studies: No results found. CT ABDOMEN PELVIS W CONTRAST  Final Result    CT PELVIS W CONTRAST  Final Result    CT HEAD WO CONTRAST  Final Result    MR BRAIN W WO CONTRAST  Final Result    MR Lumbar Spine W Wo Contrast  Final Result    MR THORACIC SPINE W WO CONTRAST  Final Result    [place] FNA SOFT TISSUE    (Results Pending)    Scheduled Meds:  aspirin EC  81 mg Oral Daily   atorvastatin  40 mg Oral QHS   enoxaparin (LOVENOX) injection  40 mg Subcutaneous QHS   feeding supplement  237 mL Oral TID BM   furosemide  40 mg Intravenous Daily   gabapentin  200 mg Oral TID   insulin aspart  0-9 Units Subcutaneous Q4H   lactulose  10 g Oral Daily   mouth rinse  15 mL Mouth Rinse BID   metoprolol succinate  25 mg Oral Daily   multivitamin with minerals  1 tablet Oral Daily   nystatin  5 mL Oral QID   oxyCODONE  20 mg Oral Q12H   pantoprazole (PROTONIX) IV  40 mg Intravenous BID   polyethylene glycol  17 g Oral BID   venlafaxine XR  75 mg Oral q morning   PRN Meds: acetaminophen **OR** [DISCONTINUED] acetaminophen, ALPRAZolam, alum & mag hydroxide-simeth, cyclobenzaprine, diclofenac Sodium, HYDROmorphone (DILAUDID) injection, labetalol, lidocaine, ondansetron **OR** ondansetron (ZOFRAN) IV, promethazine (PHENERGAN) injection (IM or IVPB) Continuous Infusions:  albumin human 25 g (01/16/21 1309)   ceFTAROline (TEFLARO) IV 600 mg (01/16/21 0857)   potassium PHOSPHATE IVPB (in mmol) 20 mmol (01/16/21  1321)   promethazine (PHENERGAN) injection (IM or IVPB) Stopped (01/15/21 1155)   vancomycin Stopped (01/16/21 0308)     LOS: 10 days  Time spent: Greater than 50% of the 35 minute visit was spent in counseling/coordination of care for the patient as laid out in the A&P.   Lewie Chamber, MD [facility] 01/16/2021, 2:10 PM

## 2021-01-16 NOTE — Progress Notes (Signed)
[facility]   Reason for visit: Follow up on MRSA bacteremia  Interval History: no acute events, WBC 13.8, remains afebrile.   Blood cultures 01/16/21: sent today                         01/14/21: ngtd 2 sets                          01/12/21: one set MRSA, second set remains ngtd                         01/11/21: both sets MRSA                         01/09/21: both sets MRSA  Day 11 vancomycin  Day 8 fluconazole Day 5 ceftaroline  Physical Exam: Constitutional:  Vitals:   01/16/21 1042 01/16/21 1219  BP: (!) 155/94 (!) 165/94  Pulse: (!) 103 (!) 101  Resp:  16  Temp:  97.6 F (36.4 C)  SpO2:  95%   patient appears in NAD Respiratory: Normal respiratory effort; CTA B Cardiovascular: RRR GI: soft, nt, nd  Review of Systems: Constitutional: negative for fevers and chills Gastrointestinal: denies nausea  Lab Results  Component Value Date   WBC 13.8 (H) 01/16/2021   HGB 8.3 (L) 01/16/2021   HCT 25.7 (L) 01/16/2021   MCV 88.6 01/16/2021   PLT 564 (H) 01/16/2021    Lab Results  Component Value Date   CREATININE 0.44 (L) 01/16/2021   BUN <5 (L) 01/16/2021   NA 136 01/16/2021   K 3.4 (L) 01/16/2021   CL 97 (L) 01/16/2021   CO2 32 01/16/2021    Lab Results  Component Value Date   ALT 10 01/16/2021   AST 10 (L) 01/16/2021   ALKPHOS 67 01/16/2021     Microbiology: Recent Results (from the past 240 hour(s))  Culture, blood (routine x 2)     Status: Abnormal   Collection Time: 01/07/21  6:52 AM   Specimen: BLOOD  Result Value Ref Range Status   Specimen Description BLOOD RIGHT ANTECUBITAL  Final   Special Requests   Final    BOTTLES DRAWN AEROBIC AND ANAEROBIC Blood Culture adequate volume   Culture  Setup Time   Final    GRAM POSITIVE COCCI IN CLUSTERS ANAEROBIC BOTTLE ONLY CRITICAL VALUE NOTED.  VALUE IS CONSISTENT WITH PREVIOUSLY REPORTED AND CALLED VALUE.    Culture (A)  Final    STAPHYLOCOCCUS AUREUS SUSCEPTIBILITIES  PERFORMED ON PREVIOUS CULTURE WITHIN THE LAST 5 DAYS. Performed at [facility], [address] [place], [place] [address]    Report Status 01/10/2021 FINAL  Final  Culture, blood (routine x 2)     Status: None   Collection Time: 01/07/21  6:52 AM   Specimen: BLOOD RIGHT HAND  Result Value Ref Range Status   Specimen Description BLOOD RIGHT HAND  Final   Special Requests   Final    BOTTLES DRAWN AEROBIC ONLY Blood Culture results may not be optimal due to an inadequate volume of blood received in culture bottles   Culture   Final    NO GROWTH 5 DAYS Performed at [facility], [address], [place], [place] [address]    Report Status 01/12/2021 FINAL  Final  Aerobic/Anaerobic Culture w Gram Stain (surgical/deep wound)  Status: None   Collection Time: 01/08/21 10:10 AM   Specimen: Wound  Result Value Ref Range Status   Specimen Description WOUND  Final   Special Requests NONE  Final   Gram Stain   Final    ABUNDANT WBC PRESENT, PREDOMINANTLY PMN NO ORGANISMS SEEN    Culture   Final    No growth aerobically or anaerobically. Performed at [facility], [address] [place], [place] [address]    Report Status 01/13/2021 FINAL  Final  Culture, blood (Routine X 2) w Reflex to ID Panel     Status: Abnormal   Collection Time: 01/09/21 10:26 AM   Specimen: BLOOD  Result Value Ref Range Status   Specimen Description BLOOD RIGHT ANTECUBITAL  Final   Special Requests   Final    BOTTLES DRAWN AEROBIC AND ANAEROBIC Blood Culture adequate volume   Culture  Setup Time   Final    GRAM POSITIVE COCCI IN CLUSTERS ANAEROBIC BOTTLE ONLY CRITICAL VALUE NOTED.  VALUE IS CONSISTENT WITH PREVIOUSLY REPORTED AND CALLED VALUE.    Culture (A)  Final    STAPHYLOCOCCUS AUREUS SUSCEPTIBILITIES PERFORMED ON PREVIOUS CULTURE WITHIN THE LAST 5 DAYS. Performed at [facility], [address], [place], [place] [address]    Report Status 01/12/2021 FINAL  Final  Culture,  blood (Routine X 2) w Reflex to ID Panel     Status: Abnormal   Collection Time: 01/09/21 10:49 AM   Specimen: BLOOD LEFT HAND  Result Value Ref Range Status   Specimen Description BLOOD LEFT HAND  Final   Special Requests   Final    BOTTLES DRAWN AEROBIC AND ANAEROBIC Blood Culture results may not be optimal due to an inadequate volume of blood received in culture bottles   Culture  Setup Time   Final    GRAM POSITIVE COCCI IN CLUSTERS IN BOTH AEROBIC AND ANAEROBIC BOTTLES CRITICAL VALUE NOTED.  VALUE IS CONSISTENT WITH PREVIOUSLY REPORTED AND CALLED VALUE.    Culture (A)  Final    STAPHYLOCOCCUS AUREUS SUSCEPTIBILITIES PERFORMED ON PREVIOUS CULTURE WITHIN THE LAST 5 DAYS. Performed at [facility], [address], [place], [place] [address]    Report Status 01/12/2021 FINAL  Final  Culture, blood (routine x 2)     Status: Abnormal   Collection Time: 01/11/21  2:02 PM   Specimen: BLOOD RIGHT HAND  Result Value Ref Range Status   Specimen Description BLOOD RIGHT HAND  Final   Special Requests   Final    BOTTLES DRAWN AEROBIC AND ANAEROBIC Blood Culture adequate volume   Culture  Setup Time   Final    GRAM POSITIVE COCCI IN CLUSTERS IN BOTH AEROBIC AND ANAEROBIC BOTTLES CRITICAL RESULT CALLED TO, READ BACK BY AND VERIFIED WITH: J. LEDFORD PHARMD, AT 0630 01/12/21 D. VANHOOK    Culture (A)  Final    STAPHYLOCOCCUS AUREUS SUSCEPTIBILITIES PERFORMED ON PREVIOUS CULTURE WITHIN THE LAST 5 DAYS. Performed at [facility], [address], [place], [place] [address]    Report Status 01/14/2021 FINAL  Final  Culture, blood (routine x 2)     Status: Abnormal   Collection Time: 01/11/21  2:03 PM   Specimen: BLOOD LEFT HAND  Result Value Ref Range Status   Specimen Description BLOOD LEFT HAND  Final   Special Requests   Final    BOTTLES DRAWN AEROBIC AND ANAEROBIC Blood Culture adequate volume   Culture  Setup Time   Final  GRAM POSITIVE COCCI IN CLUSTERS ANAEROBIC BOTTLE  ONLY CRITICAL VALUE NOTED.  VALUE IS CONSISTENT WITH PREVIOUSLY REPORTED AND CALLED VALUE. Performed at [facility], [address] [place], [place] [id number]    Culture METHICILLIN RESISTANT STAPHYLOCOCCUS AUREUS (A)  Final   Report Status 01/14/2021 FINAL  Final   Organism ID, Bacteria METHICILLIN RESISTANT STAPHYLOCOCCUS AUREUS  Final      Susceptibility   Methicillin resistant staphylococcus aureus - MIC*    CIPROFLOXACIN >=8 RESISTANT Resistant     ERYTHROMYCIN >=8 RESISTANT Resistant     GENTAMICIN <=0.5 SENSITIVE Sensitive     OXACILLIN >=4 RESISTANT Resistant     TETRACYCLINE <=1 SENSITIVE Sensitive     VANCOMYCIN <=0.5 SENSITIVE Sensitive     TRIMETH/SULFA >=320 RESISTANT Resistant     CLINDAMYCIN <=0.25 SENSITIVE Sensitive     RIFAMPIN <=0.5 SENSITIVE Sensitive     Inducible Clindamycin NEGATIVE Sensitive     * METHICILLIN RESISTANT STAPHYLOCOCCUS AUREUS  Culture, blood (routine x 2)     Status: Abnormal   Collection Time: 01/12/21 11:47 AM   Specimen: BLOOD RIGHT HAND  Result Value Ref Range Status   Specimen Description BLOOD RIGHT HAND  Final   Special Requests   Final    BOTTLES DRAWN AEROBIC AND ANAEROBIC Blood Culture results may not be optimal due to an inadequate volume of blood received in culture bottles   Culture  Setup Time   Final    GRAM POSITIVE COCCI IN CLUSTERS IN BOTH AEROBIC AND ANAEROBIC BOTTLES CRITICAL VALUE NOTED.  VALUE IS CONSISTENT WITH PREVIOUSLY REPORTED AND CALLED VALUE.    Culture (A)  Final    STAPHYLOCOCCUS AUREUS SUSCEPTIBILITIES PERFORMED ON PREVIOUS CULTURE WITHIN THE LAST 5 DAYS. Performed at [facility], [address] [place], [place] [address]    Report Status 01/15/2021 FINAL  Final  Culture, blood (routine x 2)     Status: None (Preliminary result)   Collection Time: 01/12/21 11:52 AM   Specimen: BLOOD LEFT HAND  Result Value Ref Range Status   Specimen Description BLOOD LEFT HAND  Final   Special Requests    Final    BOTTLES DRAWN AEROBIC ONLY Blood Culture adequate volume   Culture   Final    NO GROWTH 4 DAYS Performed at [facility], [address] [place], [place] [address]    Report Status PENDING  Incomplete  Culture, blood (routine x 2)     Status: None (Preliminary result)   Collection Time: 01/14/21  5:42 AM   Specimen: BLOOD RIGHT HAND  Result Value Ref Range Status   Specimen Description BLOOD RIGHT HAND  Final   Special Requests   Final    BOTTLES DRAWN AEROBIC AND ANAEROBIC Blood Culture adequate volume   Culture   Final    NO GROWTH 2 DAYS Performed at [facility], [address] [place], [place] [address]    Report Status PENDING  Incomplete  Culture, blood (routine x 2)     Status: None (Preliminary result)   Collection Time: 01/14/21  5:42 AM   Specimen: BLOOD LEFT HAND  Result Value Ref Range Status   Specimen Description BLOOD LEFT HAND  Final   Special Requests   Final    BOTTLES DRAWN AEROBIC AND ANAEROBIC Blood Culture adequate volume   Culture   Final    NO GROWTH 2 DAYS Performed at [facility], [address] [place], [place] [address]  Report Status PENDING  Incomplete    Impression/Plan:  1. 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  No further surgical drainage planned at this time.   2.  Bacteremia - MRSA and on dual coverage as above due to persistently positive blood cultures.  To date, now 2 sets of blood cultures negative.  Blood cultures repeated today.    3. Medication monitoring - creat today remains wnl.  Will continue to monitor on antibitoics.    Dr. Renold Don back tomorrow

## 2021-01-17 ENCOUNTER — Inpatient Hospital Stay (HOSPITAL_COMMUNITY): Payer: Federal, State, Local not specified - PPO

## 2021-01-17 DIAGNOSIS — R7881 Bacteremia: Secondary | ICD-10-CM | POA: Diagnosis not present

## 2021-01-17 DIAGNOSIS — A4102 Sepsis due to Methicillin resistant Staphylococcus aureus: Secondary | ICD-10-CM | POA: Diagnosis not present

## 2021-01-17 DIAGNOSIS — M4620 Osteomyelitis of vertebra, site unspecified: Secondary | ICD-10-CM | POA: Diagnosis not present

## 2021-01-17 DIAGNOSIS — A419 Sepsis, unspecified organism: Secondary | ICD-10-CM | POA: Diagnosis not present

## 2021-01-17 DIAGNOSIS — G062 Extradural and subdural abscess, unspecified: Secondary | ICD-10-CM | POA: Diagnosis not present

## 2021-01-17 LAB — GLUCOSE, CAPILLARY
Glucose-Capillary: 167 mg/dL — ABNORMAL HIGH (ref 70–99)
Glucose-Capillary: 191 mg/dL — ABNORMAL HIGH (ref 70–99)
Glucose-Capillary: 199 mg/dL — ABNORMAL HIGH (ref 70–99)
Glucose-Capillary: 201 mg/dL — ABNORMAL HIGH (ref 70–99)
Glucose-Capillary: 230 mg/dL — ABNORMAL HIGH (ref 70–99)
Glucose-Capillary: 259 mg/dL — ABNORMAL HIGH (ref 70–99)

## 2021-01-17 LAB — POTASSIUM: Potassium: 3 mmol/L — ABNORMAL LOW (ref 3.5–5.1)

## 2021-01-17 LAB — COMPREHENSIVE METABOLIC PANEL
ALT: 11 U/L (ref 0–44)
AST: 13 U/L — ABNORMAL LOW (ref 15–41)
Albumin: 3.2 g/dL — ABNORMAL LOW (ref 3.5–5.0)
Alkaline Phosphatase: 66 U/L (ref 38–126)
Anion gap: 10 (ref 5–15)
BUN: <5 mg/dL — ABNORMAL LOW (ref 6–20)
CO2: 33 mmol/L — ABNORMAL HIGH (ref 22–32)
Calcium: 8.3 mg/dL — ABNORMAL LOW (ref 8.9–10.3)
Chloride: 89 mmol/L — ABNORMAL LOW (ref 98–111)
Creatinine, Ser: 0.46 mg/dL — ABNORMAL LOW (ref 0.61–1.24)
GFR, Estimated: >60 mL/min (ref >60)
Glucose, Bld: 199 mg/dL — ABNORMAL HIGH (ref 70–99)
Potassium: 2.9 mmol/L — ABNORMAL LOW (ref 3.5–5.1)
Sodium: 132 mmol/L — ABNORMAL LOW (ref 135–145)
Total Bilirubin: 1.2 mg/dL (ref 0.3–1.2)
Total Protein: 6 g/dL — ABNORMAL LOW (ref 6.5–8.1)

## 2021-01-17 LAB — MAGNESIUM
Magnesium: 1.5 mg/dL — ABNORMAL LOW (ref 1.7–2.4)
Magnesium: 2.1 mg/dL (ref 1.7–2.4)

## 2021-01-17 LAB — CBC WITH DIFFERENTIAL/PLATELET
Abs Immature Granulocytes: 0.05 10*3/uL (ref 0.00–0.07)
Basophils Absolute: 0 10*3/uL (ref 0.0–0.1)
Basophils Relative: 0 %
Eosinophils Absolute: 0.1 10*3/uL (ref 0.0–0.5)
Eosinophils Relative: 1 %
HCT: 24.2 % — ABNORMAL LOW (ref 39.0–52.0)
Hemoglobin: 7.8 g/dL — ABNORMAL LOW (ref 13.0–17.0)
Immature Granulocytes: 0 %
Lymphocytes Relative: 14 %
Lymphs Abs: 1.6 10*3/uL (ref 0.7–4.0)
MCH: 28.3 pg (ref 26.0–34.0)
MCHC: 32.2 g/dL (ref 30.0–36.0)
MCV: 87.7 fL (ref 80.0–100.0)
Monocytes Absolute: 1.2 10*3/uL — ABNORMAL HIGH (ref 0.1–1.0)
Monocytes Relative: 10 %
Neutro Abs: 8.9 10*3/uL — ABNORMAL HIGH (ref 1.7–7.7)
Neutrophils Relative %: 75 %
Platelets: 586 10*3/uL — ABNORMAL HIGH (ref 150–400)
RBC: 2.76 MIL/uL — ABNORMAL LOW (ref 4.22–5.81)
RDW: 15.7 % — ABNORMAL HIGH (ref 11.5–15.5)
WBC: 11.9 10*3/uL — ABNORMAL HIGH (ref 4.0–10.5)
nRBC: 0 % (ref 0.0–0.2)

## 2021-01-17 LAB — CULTURE, BLOOD (ROUTINE X 2)
Culture: NO GROWTH
Special Requests: ADEQUATE

## 2021-01-17 LAB — PHOSPHORUS
Phosphorus: 2.9 mg/dL (ref 2.5–4.6)
Phosphorus: 2.9 mg/dL (ref 2.5–4.6)

## 2021-01-17 MED ORDER — MAGNESIUM SULFATE 4 GM/100ML IV SOLN
4.0000 g | Freq: Once | INTRAVENOUS | Status: AC
  Administered 2021-01-17: 4 g via INTRAVENOUS
  Filled 2021-01-17: qty 100

## 2021-01-17 MED ORDER — LOSARTAN POTASSIUM 50 MG PO TABS
25.0000 mg | ORAL_TABLET | Freq: Every morning | ORAL | Status: DC
  Administered 2021-01-17 – 2021-01-28 (×12): 25 mg via ORAL
  Filled 2021-01-17 (×12): qty 1

## 2021-01-17 MED ORDER — HYDROMORPHONE HCL 1 MG/ML IJ SOLN: 2.0000 mg | Freq: Once | INTRAMUSCULAR | Status: DC | PRN

## 2021-01-17 MED ORDER — OSMOLITE 1.5 CAL PO LIQD
1000.0000 mL | ORAL | Status: DC
  Administered 2021-01-17 – 2021-01-18 (×2): 1000 mL

## 2021-01-17 MED ORDER — POTASSIUM CHLORIDE 10 MEQ/100ML IV SOLN
10.0000 meq | INTRAVENOUS | Status: AC
  Administered 2021-01-17 (×5): 10 meq via INTRAVENOUS
  Filled 2021-01-17 (×2): qty 100

## 2021-01-17 MED ORDER — POTASSIUM CHLORIDE 10 MEQ/100ML IV SOLN
10.0000 meq | INTRAVENOUS | Status: DC
  Filled 2021-01-17 (×3): qty 100

## 2021-01-17 MED ORDER — PROSOURCE TF PO LIQD
45.0000 mL | Freq: Two times a day (BID) | ORAL | Status: DC
  Administered 2021-01-17 – 2021-01-20 (×7): 45 mL
  Filled 2021-01-17 (×9): qty 45

## 2021-01-17 NOTE — Progress Notes (Signed)
This RN has called  [phone], and asked for a Tilt bed to be sent to this patients room today. For PT/OT sessions.  Mikki Harbor, RN

## 2021-01-17 NOTE — Progress Notes (Signed)
Progress Note    Louis [name]   GLO:[id number]  DOB: 05/21/1966  DOA: 01/05/2021     11  PCP: Bobette Mo, NP  CC: back pain  Hospital Course: Louis [name] is a 55 year old Caucasian male with PMH significant for but not limited to DMII, HTN, CAD s/p CABG 2017, anxiety, chronic pain as well as other comorbidities with recent admission with an epidural abscess, vertebral osteomyelitis, psoas abscess and MRSA bacteremia who returned to the ED with worsening back pain.    He reported that his back pain has been stable until approximately 5 days PTA when it became more severe and progressively worsened since.  He did not notice any new leg numbness or weakness, but did have some urinary incontinence.  Back pain was reportedly severe and constant; worse with movement and radiating to both legs.   He denied any fevers or chills but stated he did have some mild drainage from his surgical wounds.   He was admitted on 12/12/2020 after found to have an epidural abscess and vertebral osteomyelitis. He underwent a laminectomy and evacuation of abscess on 12/13/2020 and then drainage of the psoas abscess by IR on 12/22/2020.  Blood cultures from 12/13/2020 grew out MRSA but there is no vegetation seen on TTE and blood cultures were drawn and repeated 12/15/2020 remain negative.   He has been on daptomycin at home. On admission, WBC 23.9, PLTC 681k.  COVID PCR was negative.   He had an MRI of the thoracic and lumbar spine which revealed multiple fluid collections involving the ventral epidural abscess at the L4-L5 level with progressive discitis and osteomyelitis at the L4-L5 and L5-S1 levels.  Blood cultures were repeated in the ED.  Initial plan was for lumbar decompression however Dr. Jake Samples of neurosurgery evaluated and felt that his thoracic mass-effect was improved and that even though there was persistent ostial discitis and epidural abscess throughout the thoracolumbar spine there is possibly a more loculated  focus of the abscess at L4-L5 with stenosis but no focal weakness, groin numbness or numbness and tingling in his legs he did not believe that urgent or emergent decompression was needed at that time.   Patient has been confused more often per his wife and neurosurgery is obtaining MRI of the brain with and without contrast given concern of some cerebral spread of infection.  Because he has failed multiple medical treatments will likely need broaden antibiotic therapy and will consult ID and neurosurgery feels that the stenosis L4-L5 could be decompressed however neurologically patient is doing well with no focal deficits and therefore they are recommending aggressive medical and supportive care.   ID changed to IV Vancomycin and consulted IR for drainage of the deep soft tissue collection with peripheral enhancement measuring 4.7 x 5.0 x 1.7 cm.  Dr. Jake Samples of neurosurgery recommending continuing vancomycin and aggressive medical care.   Patient had some nausea and an abnormal EKG so cardiology was consulted for further evaluation and stat troponins were ordered and they were negative.  Cardiology evaluated and felt that we should repeat a limited echo.  Limited echo done and showed no wall motion abnormalities.  There is no evidence of STEMI.   The patient is to undergo a TEE on 01/11/2021.  ID recommending following blood culture susceptibility.  Per ID if WBC remains high and the susceptibility indicates daptomycin remain sensitive they are recommending that we need to discuss with neurosurgery given regarding debridement for source control.  Interval History:  Nausea and  vomiting is a little better today. Still looked like he was heaving some. Had pain in left thigh this am too. Cortrak was just finishing being placed when I walked in. Wife bedside as well.   ROS: Constitutional: negative for chills and fevers, Respiratory: negative for cough and sputum, Cardiovascular: negative for chest pain, and  Gastrointestinal: negative for abdominal pain  Assessment & Plan: * Sepsis (HCC)-resolved as of 01/13/2021 - initially: tachycardic, tachypneic, leukocytosis; epidural abscess - see bacteremia and abscess/discitis  Discitis - see abscess  Epidural abscess - s/p laminectomy & evacuation of abscess 12/21/20 - treated with Dapto; developed worsening back pain; MRI L- and T-spine to have multiple fluid collections including ventral epidural abscess and progressive osteomyelitis at L 4-5 - daptomycin MIC was non-susceptible on lab send off from 7/13; might explain why no clearance of bacteremia vs source control - negative TTE and TEE - persistently positive blood cultures, MRSA (see bacteremia) -Neurosurgery also following.  Case rediscussed on 01/12/2021 -Repeat CT abdomen/pelvis being performed on 01/12/2021: no discrete fluid collections - now on dual treatment with vanc and ceftaroline per ID for double coverage; no further plans for surgery still at this time; hopeful that abx will treat infection as a whole  MRSA bacteremia - persistently bacteremic - follow up repeat blood cx 7/22: Negative x 3 days so far - blood cx also repeated on 7/24 per ID, also NGTD - blood cx also being ordered 7/26 per ID - continue vanc and ceftaroline; tentative plan is 10-14 day double coverage from neg cx, then back to mono vanc to complete course - ID following - see epidural abscess  Vertebral osteomyelitis (HCC) - ongoing erosion noted on repeat CT 7/20 of L3 and L4 vertebral bodies; no pathologic fracture  Protein-calorie malnutrition, severe - Patient's BMI is Body mass index is 28.56 kg/m.. - Patient has the following signs/symptoms consistent with PCM: (fat loss, muscle loss, muscle wasting). - seen by RD, appreciate assistance. Continue plan per RD -Prealbumin 5.9 mg/dL - too much N/V on 0/93; poor intake; may need cortrak to start TF but on hold for now until N/V improves; unable to place  CVL for TPN due to persistent bacteremia at this time as well  - oral intake remains severely poor - cortrak placed post-pyloric on 7/25; start TF  Acute metabolic encephalopathy-resolved as of 01/13/2021 - patient symptoms include intermittent confusion - etiology considered due to metabolic and infectious etiologies - Mentation has been slowly improving - MRI brain also performed; motion degraded.  Mild generalized atrophy, chronic small vessel disease -Noted that patient was also in significant pain on admission which may have also contributed as well as treatment with pain medications  DMII (diabetes mellitus, type 2) (HCC) - A1c was 9.1% in June 2022  - Check CBGs and use SSI for now - diet liberalized in setting of malnutrition; may need to adjust insulin if CBGs elevate  Fall-resolved as of 01/13/2021 -Despite severe weakness, patient fell out of bed on ~01/10/2021? -Repeat head CT showed no acute intracranial abnormalities noted -Fall Precautions -Waist belt for safety if needed  Cholestasis - Cholestasis of sepsis -Likely reactive and due to infection - trend CMP  GERD (gastroesophageal reflux disease) - continue protonix   Oral thrush - Continue nystatin oral suspension - Completed oral fluconazole x 7-day course  Hypokalemia -Replete and recheck as needed  Hypomagnesemia -Replete and recheck as needed  Thrombocytosis -Likely reactive in the setting of infection -continue daily CBC  Abnormal EKG -  Patient denied Chest Pain but had Nausea but no vomiting -Had ST Elevation in leads V1-V2 without Reciprocal changes -Troponin Checked and went from 24 -> 31 -Cardiology Consulted and checked Limited ECHO -ECHO showed no WMA -Cardiology does not feel he has a STEMI and thinks that his ST elevations are more pronounced in the setting of pain and hypertension  Chronic diastolic CHF (congestive heart failure) (HCC) - monitoring fluid status and renal function -  currently on lasix and albumin  - strict I&O  Anxiety -Continue Venlafaxine XR 75 mg daily and PRN Xanax  Hyponatremia -Appears chronic, possibly related to his SNRI but may also be hypovolemia from infection/fever/insensible losses; also poor PO intake -Checked urine sodium was 14 and Urine Osm was 484 -BMP daily  Normocytic anemia -Likely multifactorial in setting of ongoing infection, multiple phlebotomy draws - No overt bleeding  -Checked anemia panel and showed iron level of 19, U IBC 139, TIBC 158, saturation ratios of 12%, ferritin level 864, folate of 2.5, vitamin B12 level 1070 -Continue to monitor for signs and symptoms of bleeding -Continue trending CBC - Hgb drop: 11.6>>8.6 g/dL on 1/617/22. Vomitus was bilious when seen; check FOBT. At risk for stress ulcer. Start PPI IV BID. Trend CBC  HTN (hypertension) - continue Toprol  - BP otherwise has been stable   CAD (coronary artery disease) - No anginal complaints - s/p CABG -He had a LIMA to LAD, vein graft to distal circumflex and vein graft RCA in 2017. -His catheterization in March 2020 demonstrated patent vein grafts -Continue aspirin, statin, BB - resume ARB as BP trending back up  Increased anion gap metabolic acidosis-resolved as of 01/13/2021 - s/p treatment  Old records reviewed in assessment of this patient  Antimicrobials: Vancomycin 01/06/2021 >> 01/12/2021 Fluconazole 01/09/2021 >> 01/15/2021 Daptomycin Ceftaroline 7/20 >> current Vanc 7/21 >> current  DVT prophylaxis: enoxaparin (LOVENOX) injection 40 mg Start: 01/09/21 1500 SCDs Start: 01/06/21 0224   Code Status:   Code Status: Full Code Family Communication: Wife  Disposition Plan: Status is: Inpatient  Remains inpatient appropriate because:Ongoing active pain requiring inpatient pain management, Ongoing diagnostic testing needed not appropriate for outpatient work up, IV treatments appropriate due to intensity of illness or inability to take PO,  and Inpatient level of care appropriate due to severity of illness  Dispo: The patient is from: Home              Anticipated d/c is to:  probably rehab              Patient currently is not medically stable to d/c.   Difficult to place patient No  Risk of unplanned readmission score: Unplanned Admission- Pilot do not use: 34.88   Objective: Blood pressure (!) 169/98, pulse (!) 104, temperature 98.8 F (37.1 C), temperature source Oral, resp. rate 18, weight 94.3 kg, SpO2 97 %.  Examination: General appearance:  Fatigued appearing adult man lying in bed who appears a little more comfortable than previously seen and has no active vomiting but still some heaving Head: Normocephalic, without obvious abnormality, atraumatic Eyes:  EOMI Lungs: clear to auscultation bilaterally Heart: regular rate and rhythm and S1, S2 normal Abdomen: normal findings: bowel sounds normal and soft, non-tender Extremities: Resolved posterior thigh edema bilaterally.  No bruising or tenderness on palpation of left thigh where patient was complaining of pain. Musculoskeletal: Also able to passively flex his thighs bilaterally with no pain on flexion Skin: mobility and turgor normal Neurologic: Weak throughout (worse  in LE) but no focal deficits appreciated  Consultants:  ID Neurosurgery  Cardiology IR  Procedures:  T7-8, T12-L1, L3-L4 laminectomy for Epidural Abscess, 6/28 Left psoas muscle abscess drainage, 6/30 Paraspinal abscess drainage, 7/16  Data Reviewed: I have personally reviewed following labs and imaging studies Results for orders placed or performed during the hospital encounter of 01/05/21 (from the past 24 hour(s))  Glucose, capillary     Status: Abnormal   Collection Time: 01/16/21  4:03 PM  Result Value Ref Range   Glucose-Capillary 217 (H) 70 - 99 mg/dL  Glucose, capillary     Status: Abnormal   Collection Time: 01/16/21  7:47 PM  Result Value Ref Range   Glucose-Capillary 261 (H)  70 - 99 mg/dL  Glucose, capillary     Status: Abnormal   Collection Time: 01/16/21 11:08 PM  Result Value Ref Range   Glucose-Capillary 216 (H) 70 - 99 mg/dL  CBC with Differential/Platelet     Status: Abnormal   Collection Time: 01/17/21  2:20 AM  Result Value Ref Range   WBC 11.9 (H) 4.0 - 10.5 K/uL   RBC 2.76 (L) 4.22 - 5.81 MIL/uL   Hemoglobin 7.8 (L) 13.0 - 17.0 g/dL   HCT 02.7 (L) 25.3 - 66.4 %   MCV 87.7 80.0 - 100.0 fL   MCH 28.3 26.0 - 34.0 pg   MCHC 32.2 30.0 - 36.0 g/dL   RDW 40.3 (H) 47.4 - 25.9 %   Platelets 586 (H) 150 - 400 K/uL   nRBC 0.0 0.0 - 0.2 %   Neutrophils Relative % 75 %   Neutro Abs 8.9 (H) 1.7 - 7.7 K/uL   Lymphocytes Relative 14 %   Lymphs Abs 1.6 0.7 - 4.0 K/uL   Monocytes Relative 10 %   Monocytes Absolute 1.2 (H) 0.1 - 1.0 K/uL   Eosinophils Relative 1 %   Eosinophils Absolute 0.1 0.0 - 0.5 K/uL   Basophils Relative 0 %   Basophils Absolute 0.0 0.0 - 0.1 K/uL   Immature Granulocytes 0 %   Abs Immature Granulocytes 0.05 0.00 - 0.07 K/uL  Magnesium     Status: Abnormal   Collection Time: 01/17/21  2:20 AM  Result Value Ref Range   Magnesium 1.5 (L) 1.7 - 2.4 mg/dL  Comprehensive metabolic panel     Status: Abnormal   Collection Time: 01/17/21  2:20 AM  Result Value Ref Range   Sodium 132 (L) 135 - 145 mmol/L   Potassium 2.9 (L) 3.5 - 5.1 mmol/L   Chloride 89 (L) 98 - 111 mmol/L   CO2 33 (H) 22 - 32 mmol/L   Glucose, Bld 199 (H) 70 - 99 mg/dL   BUN <5 (L) 6 - 20 mg/dL   Creatinine, Ser 5.63 (L) 0.61 - 1.24 mg/dL   Calcium 8.3 (L) 8.9 - 10.3 mg/dL   Total Protein 6.0 (L) 6.5 - 8.1 g/dL   Albumin 3.2 (L) 3.5 - 5.0 g/dL   AST 13 (L) 15 - 41 U/L   ALT 11 0 - 44 U/L   Alkaline Phosphatase 66 38 - 126 U/L   Total Bilirubin 1.2 0.3 - 1.2 mg/dL   GFR, Estimated >87 >56 mL/min   Anion gap 10 5 - 15  Phosphorus     Status: None   Collection Time: 01/17/21  2:20 AM  Result Value Ref Range   Phosphorus 2.9 2.5 - 4.6 mg/dL  Glucose, capillary      Status: Abnormal   Collection Time:  01/17/21  3:18 AM  Result Value Ref Range   Glucose-Capillary 201 (H) 70 - 99 mg/dL  Glucose, capillary     Status: Abnormal   Collection Time: 01/17/21  8:00 AM  Result Value Ref Range   Glucose-Capillary 167 (H) 70 - 99 mg/dL  Glucose, capillary     Status: Abnormal   Collection Time: 01/17/21 12:37 PM  Result Value Ref Range   Glucose-Capillary 191 (H) 70 - 99 mg/dL    Recent Results (from the past 240 hour(s))  Aerobic/Anaerobic Culture w Gram Stain (surgical/deep wound)     Status: None   Collection Time: 01/08/21 10:10 AM   Specimen: Wound  Result Value Ref Range Status   Specimen Description WOUND  Final   Special Requests NONE  Final   Gram Stain   Final    ABUNDANT WBC PRESENT, PREDOMINANTLY PMN NO ORGANISMS SEEN    Culture   Final    No growth aerobically or anaerobically. Performed at [facility], [address], [place], [place] [address]    Report Status 01/13/2021 FINAL  Final  Culture, blood (Routine X 2) w Reflex to ID Panel     Status: Abnormal   Collection Time: 01/09/21 10:26 AM   Specimen: BLOOD  Result Value Ref Range Status   Specimen Description BLOOD RIGHT ANTECUBITAL  Final   Special Requests   Final    BOTTLES DRAWN AEROBIC AND ANAEROBIC Blood Culture adequate volume   Culture  Setup Time   Final    GRAM POSITIVE COCCI IN CLUSTERS ANAEROBIC BOTTLE ONLY CRITICAL VALUE NOTED.  VALUE IS CONSISTENT WITH PREVIOUSLY REPORTED AND CALLED VALUE.    Culture (A)  Final    STAPHYLOCOCCUS AUREUS SUSCEPTIBILITIES PERFORMED ON PREVIOUS CULTURE WITHIN THE LAST 5 DAYS. Performed at [facility], [address] [place], [place] [address]    Report Status 01/12/2021 FINAL  Final  Culture, blood (Routine X 2) w Reflex to ID Panel     Status: Abnormal   Collection Time: 01/09/21 10:49 AM   Specimen: BLOOD LEFT HAND  Result Value Ref Range Status   Specimen Description BLOOD LEFT HAND  Final   Special Requests    Final    BOTTLES DRAWN AEROBIC AND ANAEROBIC Blood Culture results may not be optimal due to an inadequate volume of blood received in culture bottles   Culture  Setup Time   Final    GRAM POSITIVE COCCI IN CLUSTERS IN BOTH AEROBIC AND ANAEROBIC BOTTLES CRITICAL VALUE NOTED.  VALUE IS CONSISTENT WITH PREVIOUSLY REPORTED AND CALLED VALUE.    Culture (A)  Final    STAPHYLOCOCCUS AUREUS SUSCEPTIBILITIES PERFORMED ON PREVIOUS CULTURE WITHIN THE LAST 5 DAYS. Performed at [facility], [address], [place], [place] [address]    Report Status 01/12/2021 FINAL  Final  Culture, blood (routine x 2)     Status: Abnormal   Collection Time: 01/11/21  2:02 PM   Specimen: BLOOD RIGHT HAND  Result Value Ref Range Status   Specimen Description BLOOD RIGHT HAND  Final   Special Requests   Final    BOTTLES DRAWN AEROBIC AND ANAEROBIC Blood Culture adequate volume   Culture  Setup Time   Final    GRAM POSITIVE COCCI IN CLUSTERS IN BOTH AEROBIC AND ANAEROBIC BOTTLES CRITICAL RESULT CALLED TO, READ BACK BY AND VERIFIED WITH: J. LEDFORD PHARMD, AT 0630 01/12/21 D. VANHOOK    Culture (A)  Final    STAPHYLOCOCCUS AUREUS  SUSCEPTIBILITIES PERFORMED ON PREVIOUS CULTURE WITHIN THE LAST 5 DAYS. Performed at [facility], [address] [place], [place] [address]    Report Status 01/14/2021 FINAL  Final  Culture, blood (routine x 2)     Status: Abnormal   Collection Time: 01/11/21  2:03 PM   Specimen: BLOOD LEFT HAND  Result Value Ref Range Status   Specimen Description BLOOD LEFT HAND  Final   Special Requests   Final    BOTTLES DRAWN AEROBIC AND ANAEROBIC Blood Culture adequate volume   Culture  Setup Time   Final    GRAM POSITIVE COCCI IN CLUSTERS ANAEROBIC BOTTLE ONLY CRITICAL VALUE NOTED.  VALUE IS CONSISTENT WITH PREVIOUSLY REPORTED AND CALLED VALUE. Performed at [facility], [address], [place], [place] [address]    Culture METHICILLIN RESISTANT STAPHYLOCOCCUS AUREUS  (A)  Final   Report Status 01/14/2021 FINAL  Final   Organism ID, Bacteria METHICILLIN RESISTANT STAPHYLOCOCCUS AUREUS  Final      Susceptibility   Methicillin resistant staphylococcus aureus - MIC*    CIPROFLOXACIN >=8 RESISTANT Resistant     ERYTHROMYCIN >=8 RESISTANT Resistant     GENTAMICIN <=0.5 SENSITIVE Sensitive     OXACILLIN >=4 RESISTANT Resistant     TETRACYCLINE <=1 SENSITIVE Sensitive     VANCOMYCIN <=0.5 SENSITIVE Sensitive     TRIMETH/SULFA >=320 RESISTANT Resistant     CLINDAMYCIN <=0.25 SENSITIVE Sensitive     RIFAMPIN <=0.5 SENSITIVE Sensitive     Inducible Clindamycin NEGATIVE Sensitive     * METHICILLIN RESISTANT STAPHYLOCOCCUS AUREUS  Culture, blood (routine x 2)     Status: Abnormal   Collection Time: 01/12/21 11:47 AM   Specimen: BLOOD RIGHT HAND  Result Value Ref Range Status   Specimen Description BLOOD RIGHT HAND  Final   Special Requests   Final    BOTTLES DRAWN AEROBIC AND ANAEROBIC Blood Culture results may not be optimal due to an inadequate volume of blood received in culture bottles   Culture  Setup Time   Final    GRAM POSITIVE COCCI IN CLUSTERS IN BOTH AEROBIC AND ANAEROBIC BOTTLES CRITICAL VALUE NOTED.  VALUE IS CONSISTENT WITH PREVIOUSLY REPORTED AND CALLED VALUE.    Culture (A)  Final    STAPHYLOCOCCUS AUREUS SUSCEPTIBILITIES PERFORMED ON PREVIOUS CULTURE WITHIN THE LAST 5 DAYS. Performed at [facility], [address], [place], [place] [address]    Report Status 01/15/2021 FINAL  Final  Culture, blood (routine x 2)     Status: None   Collection Time: 01/12/21 11:52 AM   Specimen: BLOOD LEFT HAND  Result Value Ref Range Status   Specimen Description BLOOD LEFT HAND  Final   Special Requests   Final    BOTTLES DRAWN AEROBIC ONLY Blood Culture adequate volume   Culture   Final    NO GROWTH 5 DAYS Performed at [facility], [address], [place], [place] [address]    Report Status 01/17/2021 FINAL  Final  Culture, blood  (routine x 2)     Status: None (Preliminary result)   Collection Time: 01/14/21  5:42 AM   Specimen: BLOOD RIGHT HAND  Result Value Ref Range Status   Specimen Description BLOOD RIGHT HAND  Final   Special Requests   Final    BOTTLES DRAWN AEROBIC AND ANAEROBIC Blood Culture adequate volume   Culture   Final    NO GROWTH 3 DAYS Performed at [facility], [address], [place], [place] [address]    Report Status PENDING  Incomplete  Culture, blood (routine x 2)     Status: None (Preliminary result)   Collection Time: 01/14/21  5:42 AM   Specimen: BLOOD LEFT HAND  Result Value Ref Range Status   Specimen Description BLOOD LEFT HAND  Final   Special Requests   Final    BOTTLES DRAWN AEROBIC AND ANAEROBIC Blood Culture adequate volume   Culture   Final    NO GROWTH 3 DAYS Performed at [facility], [address], [place], [place] [address]    Report Status PENDING  Incomplete  Culture, blood (routine x 2)     Status: None (Preliminary result)   Collection Time: 01/16/21  6:43 AM   Specimen: BLOOD RIGHT HAND  Result Value Ref Range Status   Specimen Description BLOOD RIGHT HAND  Final   Special Requests   Final    BOTTLES DRAWN AEROBIC ONLY Blood Culture adequate volume   Culture   Final    NO GROWTH < 24 HOURS Performed at [facility], [address] [name], [place] [address]    Report Status PENDING  Incomplete  Culture, blood (routine x 2)     Status: None (Preliminary result)   Collection Time: 01/16/21  6:44 AM   Specimen: BLOOD LEFT HAND  Result Value Ref Range Status   Specimen Description BLOOD LEFT HAND  Final   Special Requests   Final    BOTTLES DRAWN AEROBIC AND ANAEROBIC Blood Culture adequate volume   Culture   Final    NO GROWTH < 24 HOURS Performed at [facility], [address] [place], [place] [address]    Report Status PENDING  Incomplete     Radiology Studies: DG Abd Portable 1V  Result Date: 01/17/2021 CLINICAL DATA:   Feeding tube placement EXAM: PORTABLE ABDOMEN - 1 VIEW COMPARISON:  None FINDINGS: There is been interval placement a weighted feeding tube. The tip of the feeding tube is in the left upper quadrant of the abdomen. This may be looped within the stomach with tip in the gastric fundus versus post pyloric with tip at the duodenal jejunal junction. If post pyloric placement of the feeding tube is desired injection of a small volume contrast material through the feeding tube may confirm location. IMPRESSION: Interval placement of weighted feeding tube with tip in the gastric fundus versus duodenal jejunal junction. If post pyloric placement of the feeding tube is desired, injection of a small volume water soluble contrast material through the tube may confirm location. Electronically Signed   By: Signa Kell M.D.   On: 01/17/2021 11:56   DG Abd Portable 1V  Final Result    CT ABDOMEN PELVIS W CONTRAST  Final Result    CT PELVIS W CONTRAST  Final Result    CT HEAD WO CONTRAST  Final Result    MR BRAIN W WO CONTRAST  Final Result    MR Lumbar Spine W Wo Contrast  Final Result    MR THORACIC SPINE W WO CONTRAST  Final Result    [place] FNA SOFT TISSUE    (Results Pending)    Scheduled Meds:  aspirin EC  81 mg Oral Daily   atorvastatin  40 mg Oral QHS   enoxaparin (LOVENOX) injection  40 mg Subcutaneous QHS   feeding supplement  237 mL Oral TID BM   feeding supplement (PROSource TF)  45 mL Per Tube  BID   gabapentin  200 mg Oral TID   insulin aspart  0-9 Units Subcutaneous Q4H   lactulose  10 g Oral Daily   losartan  25 mg Oral q morning   mouth rinse  15 mL Mouth Rinse BID   metoprolol succinate  25 mg Oral Daily   multivitamin with minerals  1 tablet Oral Daily   nystatin  5 mL Oral QID   oxyCODONE  20 mg Oral Q12H   pantoprazole (PROTONIX) IV  40 mg Intravenous BID   polyethylene glycol  17 g Oral BID   venlafaxine XR  75 mg Oral q morning   PRN Meds: acetaminophen **OR**  [DISCONTINUED] acetaminophen, ALPRAZolam, alum & mag hydroxide-simeth, cyclobenzaprine, diclofenac Sodium, HYDROmorphone (DILAUDID) injection, HYDROmorphone (DILAUDID) injection, labetalol, lidocaine, ondansetron **OR** ondansetron (ZOFRAN) IV, promethazine (PHENERGAN) injection (IM or IVPB) Continuous Infusions:  ceFTAROline (TEFLARO) IV 600 mg (01/17/21 0907)   feeding supplement (OSMOLITE 1.5 CAL)     magnesium sulfate bolus IVPB 4 g (01/17/21 1217)   potassium chloride     promethazine (PHENERGAN) injection (IM or IVPB) Stopped (01/15/21 1155)   vancomycin 1,750 mg (01/17/21 0143)     LOS: 11 days  Time spent: Greater than 50% of the 35 minute visit was spent in counseling/coordination of care for the patient as laid out in the A&P.   Lewie Chamber, MD [facility] 01/17/2021, 12:47 PM

## 2021-01-17 NOTE — Progress Notes (Signed)
Inpatient Diabetes Program Recommendations  AACE/ADA: New Consensus Statement on Inpatient Glycemic Control (2015)  Target Ranges:  Prepandial:   less than 140 mg/dL      Peak postprandial:   less than 180 mg/dL (1-2 hours)      Critically ill patients:  140 - 180 mg/dL   Lab Results  Component Value Date   GLUCAP 191 (H) 01/17/2021   HGBA1C 9.1 (H) 12/13/2020    Review of Glycemic Control Results for Louis [name], Louis [name] (MRN [id number]) as of 01/17/2021 14:06  Ref. Range 01/16/2021 19:47 01/16/2021 23:08 01/17/2021 03:18 01/17/2021 08:00 01/17/2021 12:37  Glucose-Capillary Latest Ref Range: 70 - 99 mg/dL 616 (H) 073 (H) 710 (H) 167 (H) 191 (H)   Diabetes history: Type 2 DM Outpatient Diabetes medications: Farxiga 10 mg QAM, Glipizide 5 mg BID, Metformin 1000 mg QA/ 500 mg QP Current orders for Inpatient glycemic control: Novolog 0-9 units Q4H  Inpatient Diabetes Program Recommendations:    Consider adding Lantus 14 units QD (94 kg x 0.15).   Thanks, [name], MSN, RNC-OB Diabetes Coordinator [phone] (8a-5p)

## 2021-01-17 NOTE — Progress Notes (Signed)
Physical Therapy Treatment Patient Details Name: Louis [name] MRN: [id number] DOB: 23-Jan-1966 Today's Date: 01/17/2021    History of Present Illness Pt is a 55 y.o. male who presented 7/13 with worsening back pain with recurrent MRSA bacteremia after discharging 7/3 from being admitted with epidural abscess, vertebral osteomyelitis L3-S1, psoas abscess, and MRSA bacteremia with s/p 12/21/20 T7-8, T12-L1, L3-4 laminectomies for evacuation of epidural abscess. 7/13 mri lumbar/thoracic showed several areas of fluid collection with a more loculated focus at L4-5. 7/20 imaging revealed progressive erosive changes involving the L3 and L4 vertebral bodies  in keeping with progressive changes of discitis osteomyelitis. PMH: CAD, hypertension, uncontrolled diabetes mellitus.    PT Comments    Pt continues to be in extreme back and L LE pain greatly limiting ability to participate in therapy. Pt was pre-medicated and was sleepy upon PT arrival. Pt did attempt to sit up EOB however unable to tolerate due to onset of severe L LE pain despite being lethargic and sleepy prior to attempt. Pt with difficulty staying awake and on task due to lethargy from recent pain medication. Pt kept repeating "I have to get home. I can't do this". When asked how he was going to get home when he couldn't get up he stated "oh my wife will give me a ride home". Pt with poor awareness of deficits. Acute PT to cont to follow. Followed up with RN regarding getting the Kreg Tilt bed as the order was placed on 7/21 however it hasn't been delivered. RN to call.    Follow Up Recommendations  CIR;Supervision/Assistance - 24 hour     Equipment Recommendations       Recommendations for Other Services Rehab consult     Precautions / Restrictions Precautions Precautions: Back Precaution Booklet Issued: No Precaution Comments: attempted to review however pt either asleep from pain medicine or writhing in pain when  moving Restrictions Weight Bearing Restrictions: No    Mobility  Bed Mobility Overal bed mobility: Needs Assistance Bed Mobility: Rolling;Sidelying to Sit;Sit to Supine Rolling: Min assist Sidelying to sit: Max assist;+2 for physical assistance;HOB elevated   Sit to supine: Mod assist;+2 for physical assistance;HOB elevated   General bed mobility comments: pt initiated rolling to the R well with max verbal and tactile cues to stay awake and on task, pt requiring max encouragement and ultimatley maxAX2 at trunk and for LE management to sit EOB, however pt immeadiately wanted to return to lying down due to onset of instense L LE pain    Transfers                 General transfer comment: unable due to pain  Ambulation/Gait                 Stairs             Wheelchair Mobility    Modified Rankin (Stroke Patients Only) Modified Rankin (Stroke Patients Only) Pre-Morbid Rankin Score: No symptoms Modified Rankin: Severe disability     Balance Overall balance assessment: Needs assistance Sitting-balance support: Bilateral upper extremity supported;Feet supported Sitting balance-Leahy Scale: Poor Sitting balance - Comments: unable to tolerate due to pain                                    Cognition Arousal/Alertness: Lethargic;Suspect due to medications (pt was given IV pain meds about 20 min prior to session) Behavior During Therapy: Restless  Overall Cognitive Status: Impaired/Different from baseline Area of Impairment: Memory;Following commands;Safety/judgement                   Current Attention Level: Focused Memory: Decreased short-term memory Following Commands: Follows one step commands with increased time Safety/Judgement: Decreased awareness of safety;Decreased awareness of deficits     General Comments: pt perseverating on going home despite not being able to move to EOB let alone stand/amb, pt states "my wife will give  me a ride" pt with no awareness of severity of mobility impairment      Exercises      General Comments General comments (skin integrity, edema, etc.): HR 110s at rest      Pertinent Vitals/Pain Pain Assessment: Faces Faces Pain Scale: Hurts worst (10/10 pain with movement, asleep when at rest, pt was just given pain meds, per wife his dilauidid dose was doubled) Pain Location: L leg and back Pain Descriptors / Indicators: Discomfort;Grimacing;Guarding Pain Intervention(s): Premedicated before session    Home Living                      Prior Function            PT Goals (current goals can now be found in the care plan section) Progress towards PT goals: Not progressing toward goals - comment (due to pain)    Frequency    Min 5X/week      PT Plan Current plan remains appropriate    Co-evaluation PT/OT/SLP Co-Evaluation/Treatment: Yes Reason for Co-Treatment: To address functional/ADL transfers PT goals addressed during session: Mobility/safety with mobility        AM-PAC PT "6 Clicks" Mobility   Outcome Measure  Help needed turning from your back to your side while in a flat bed without using bedrails?: A Little Help needed moving from lying on your back to sitting on the side of a flat bed without using bedrails?: A Lot Help needed moving to and from a bed to a chair (including a wheelchair)?: Total Help needed standing up from a chair using your arms (e.g., wheelchair or bedside chair)?: Total Help needed to walk in hospital room?: Total Help needed climbing 3-5 steps with a railing? : Total 6 Click Score: 9    End of Session   Activity Tolerance: Patient limited by pain;Other (comment);Patient limited by lethargy Patient left: in bed;with call bell/phone within reach;with bed alarm set;with SCD's reapplied;with family/visitor present Nurse Communication: Mobility status;Other (comment) PT Visit Diagnosis: Pain;Muscle weakness (generalized)  (M62.81);Difficulty in walking, not elsewhere classified (R26.2) Pain - part of body: Leg     Time: 1255-1313 PT Time Calculation (min) (ACUTE ONLY): 18 min  Charges:  $Therapeutic Activity: 8-22 mins                     Lewis Shock, PT, DPT Acute Rehabilitation Services Pager #: [phone] Office #: [phone]    Iona Hansen 01/17/2021, 2:28 PM

## 2021-01-17 NOTE — Progress Notes (Signed)
Nutrition Follow-up  DOCUMENTATION CODES:   Severe malnutrition in context of acute illness/injury  INTERVENTION:   Initiate tube feeds via post-pyloric Cortrak tube: - Start Osmolite 1.5 @ 25 ml/hr and advance by 10 ml q 8 hours to goal rate of 65 ml/hr (1560 ml/day) - ProSource TF 45 ml BID  Tube feeding regimen at goal provides 2420 kcal, 120 grams of protein, and 1189 ml of H2O.  Monitor magnesium, potassium, and phosphorus twice daily for at least 3 days, MD to replete as needed, as pt is at risk for refeeding syndrome given severe malnutrition, inadequate PO intake since admission.  - Continue Ensure Enlive po TID, each supplement provides 350 kcal and 20 grams of protein  NUTRITION DIAGNOSIS:   Severe Malnutrition related to acute illness (MRSA bacteremia) as evidenced by mild fat depletion, moderate muscle depletion, energy intake < or equal to 50% for > or equal to 5 days.  Ongoing, being addressed via initiation of TF  GOAL:   Patient will meet greater than or equal to 90% of their needs  Met via TF at goal  MONITOR:   PO intake, Supplement acceptance, Labs, Weight trends, Skin, I & O's  REASON FOR ASSESSMENT:   Consult Assessment of nutrition requirement/status  ASSESSMENT:   55 year old male who presented to the ED on 7/13 with increased back pain after thoracic and lumbar laminectomies on 12/21/20. Pt with recent admission for epidural abscess, vertebral osteomyelitis, psoas abscess, and MRSA bacteremia. PMH of CAD, T2DM, HTN, HLD, CAD s/p CABG in 2017, anxiety.  7/16 - s/p US aspiration of lumbar paraspinal collection 7/19 - s/p TEE without vegetation 7/22 - pt with N/V 7/25 - Cortrak placed (tip post-pyloric)  Consult received for tube feeding initiation and management. Cortrak placed today, tip is post-pyloric per Cortrak team.  Met with pt and wife at bedside. Pt still complaining of nausea but just had Cortrak tube placed which could be contributing.  Pt reports last episode of vomiting was "a while ago."  Discussed pt with RN. Will start tube feeds at trickle rate and slowly advance to goal. Pt is at risk for refeeding. Pt barely ate over the weekend due to significant N/V.  Per abdominal x-ray reading, pt with tube tip either in fundus of stomach or post-pyloric. Discussed with MD who reports the x-ray looks good on his end and to use tube as is. If pt does not tolerate tube feeds, can obtain repeat abdominal x-ray with contrast.  Admit weight: 92.9 kg Current weight: 94.3 kg  Pt with moderate pitting generalized edema and deep pitting edema to BLE.  Meal Completion: 0-50%  Medications reviewed and include: Ensure Enlive TID, IV lasix, lactulose, MVI with minerals, nystatin, IV protonix, miralax, IV albumin, IV abx, IV magnesium sulfate 4 grams once, IV KCl 10 mEq x 6 runs   Labs reviewed: sodium 132, potassium 2.9, magnesium 1.5, hemoglobin 7.8, HCT 24.2 CBG's: 167-261 x 24 hours  UOP: 2024 ml x 24 hours  Diet Order:   Diet Order             Diet regular Room service appropriate? Yes; Fluid consistency: Thin  Diet effective now                   EDUCATION NEEDS:   Education needs have been addressed  Skin:  Skin Assessment: Skin Integrity Issues: Incisions: back  Last BM:  01/15/21  Height:   Ht Readings from Last 1 Encounters:  12/21/20 _0  (  1.803 m)    Weight:   Wt Readings from Last 1 Encounters:  01/13/21 94.3 kg    BMI:  Body mass index is 29.01 kg/m.  Estimated Nutritional Needs:   Kcal:  2300-2500  Protein:  115-130 grams  Fluid:  >/= 2.0 L    Gustavus Bryant, MS, RD, LDN Inpatient Clinical Dietitian Please see AMiON for contact information.

## 2021-01-17 NOTE — Procedures (Signed)
Cortrak  Tube Type:  Cortrak - 43 inches Tube Location:  Left nare Initial Placement:  Postpyloric Secured by: Bridle Technique Used to Measure Tube Placement:  Marking at nare/corner of mouth Cortrak Secured At:  100 cm  Cortrak Tube Team Note:  Consult received to place a Cortrak feeding tube.   X-ray is required, abdominal x-ray has been ordered by the Cortrak team. Please confirm tube placement before using the Cortrak tube.   If the tube becomes dislodged please keep the tube and contact the Cortrak team at [email or web address] (password [email or web address]) for replacement.  If after hours and replacement cannot be delayed, place a NG tube and confirm placement with an abdominal x-ray.    Jeana Kersting MS, RD, LDN Please refer to AMION for RD and/or RD on-call/weekend/after hours pager   

## 2021-01-17 NOTE — Progress Notes (Signed)
Inpatient Rehab Admissions Coordinator Note:   Per therapy recommendations, pt was screened for CIR candidacy by Megan Salon, MS CCC-SLP. At this time, Pt. Appears pain-limited, unable to tolerate Pt. May progress to be able to tolerate CIR, but does not appear able to at this time. [facility] team will continue to follow for potential admit pending progress and participation with therapies.  Megan Salon, MS, CCC-SLP Rehab Admissions Coordinator  [phone] (celll) [phone] (office)

## 2021-01-17 NOTE — Progress Notes (Signed)
Occupational Therapy Treatment Patient Details Name: Louis [name] MRN: [id number] DOB: 05-28-1966 Today's Date: 01/17/2021    History of present illness Pt is a 55 y.o. male who presented 7/13 with worsening back pain with recurrent MRSA bacteremia after discharging 7/3 from being admitted with epidural abscess, vertebral osteomyelitis L3-S1, psoas abscess, and MRSA bacteremia with s/p 12/21/20 T7-8, T12-L1, L3-4 laminectomies for evacuation of epidural abscess. 7/13 mri lumbar/thoracic showed several areas of fluid collection with a more loculated focus at L4-5. 7/20 imaging revealed progressive erosive changes involving the L3 and L4 vertebral bodies  in keeping with progressive changes of discitis osteomyelitis. PMH: CAD, hypertension, uncontrolled diabetes mellitus.   OT comments  Pt severely limited by pain, decreased ability to care for self and decreased safety awareness. Pt sitting at EOB only momentarily before returning to supine due to pain with modA +2. Pt assisting with BUEs holding onto rail and RLE at EOB; LLE continues to be in severe pain. HR 110s increasing to 125 BPM with exertion. Pt would benefit from continued OT skilled services. OT following acutely. (RN aware of Kreg bed ordered and will call).    Follow Up Recommendations  CIR    Equipment Recommendations  None recommended by OT    Recommendations for Other Services      Precautions / Restrictions Precautions Precautions: Back Precaution Booklet Issued: No Precaution Comments: attempted to review however pt either asleep from pain medicine or moaning in pain when moving Restrictions Weight Bearing Restrictions: No       Mobility Bed Mobility Overal bed mobility: Needs Assistance Bed Mobility: Rolling;Sidelying to Sit;Sit to Supine Rolling: Min assist Sidelying to sit: Max assist;+2 for physical assistance;HOB elevated   Sit to supine: Mod assist;+2 for physical assistance;HOB elevated   General bed mobility  comments: Pt initiating rolling to R side; use of BUEs to rail to assist with trunk elevation    Transfers                 General transfer comment: unable due to pain    Balance Overall balance assessment: Needs assistance Sitting-balance support: Bilateral upper extremity supported;Feet supported Sitting balance-Leahy Scale: Poor Sitting balance - Comments: unable to tolerate >5 secs due to pain                                   ADL either performed or assessed with clinical judgement   ADL Overall ADL's : Needs assistance/impaired                     Lower Body Dressing: Total assistance;Bed level               Functional mobility during ADLs: Moderate assistance;+2 for physical assistance;+2 for safety/equipment General ADL Comments: Pt severely limited by pain, decreased ability to care for self and decreased safety awareness. Pt sitting at EOB only momentarily before returning to supine due to pain.     Vision       Perception     Praxis      Cognition Arousal/Alertness: Lethargic;Suspect due to medications (pt was given IV pain meds about 20 min prior to session) Behavior During Therapy: Restless Overall Cognitive Status: Impaired/Different from baseline Area of Impairment: Memory;Following commands;Safety/judgement                   Current Attention Level: Focused Memory: Decreased short-term memory Following Commands: Follows one  step commands with increased time Safety/Judgement: Decreased awareness of safety;Decreased awareness of deficits     General Comments: Pt stating x3 times "I want to go home" "My wife will drive me home." Spouse in room and aware of pain meds altering pt's ability for self awareness of current deficits.        Exercises     Shoulder Instructions       General Comments HR 110s increasing to 125 BPM with exertion.    Pertinent Vitals/ Pain       Pain Assessment: Faces Faces Pain  Scale: Hurts worst (10/10 pain with movement, asleep when at rest, pt was just given pain meds, per wife his dilauidid dose was doubled) Pain Location: L leg and back Pain Descriptors / Indicators: Discomfort;Grimacing;Guarding Pain Intervention(s): Premedicated before session;Repositioned  Home Living                                          Prior Functioning/Environment              Frequency  Min 2X/week        Progress Toward Goals  OT Goals(current goals can now be found in the care plan section)  Progress towards OT goals: Progressing toward goals  Acute Rehab OT Goals Patient Stated Goal: to not hurt OT Goal Formulation: With patient/family Time For Goal Achievement: 01/27/21 Potential to Achieve Goals: Good  Plan Discharge plan remains appropriate    Co-evaluation    PT/OT/SLP Co-Evaluation/Treatment: Yes Reason for Co-Treatment: For patient/therapist safety;To address functional/ADL transfers PT goals addressed during session: Mobility/safety with mobility OT goals addressed during session: ADL's and self-care      AM-PAC OT "6 Clicks" Daily Activity     Outcome Measure   Help from another person eating meals?: A Little Help from another person taking care of personal grooming?: A Little Help from another person toileting, which includes using toliet, bedpan, or urinal?: A Lot Help from another person bathing (including washing, rinsing, drying)?: A Lot Help from another person to put on and taking off regular upper body clothing?: A Lot Help from another person to put on and taking off regular lower body clothing?: Total 6 Click Score: 13    End of Session Equipment Utilized During Treatment: Gait belt;Rolling walker  OT Visit Diagnosis: Pain Pain - Right/Left: Left Pain - part of body: Leg   Activity Tolerance Patient limited by pain   Patient Left in bed;with call bell/phone within reach;with family/visitor present;with bed  alarm set   Nurse Communication Mobility status;Other (comment) (RN- aware of IV beeping)        Time: 2878-6767 OT Time Calculation (min): 18 min  Charges: OT General Charges $OT Visit: 1 Visit OT Treatments $Therapeutic Activity: 8-22 mins  Flora Lipps, OTR/L Acute Rehabilitation Services Pager: [phone] Office: [phone]    MISAEL MCGAHA 01/17/2021, 4:24 PM

## 2021-01-17 NOTE — Progress Notes (Signed)
[facility]  Date of Admission:  01/05/2021     CC: Mrsa bacteremia  Lines: Peripheral iv's  Abx: 7/20-c ceftaroline 7/14-20; 7/21-c vanc  7/20 dapto  Previously daptomycin  ASSESSMENT: 55 yo male recent mrsa bacteremia/epidural abscess/OM and psoas abscess s/p open debridement 6/28 and IR drainage of left psoas discharged on daptomycin, now readmitted 7/13 for worsening back pain of a few days found to have recurrent mrsa bacteremia  7/13 & 7/15 & 7/17 & 7/19 & 7/20 bcx mrsa   7/13 mri lumbar/thoracic showed several areas of fluid collection with a more loculated focus at L4-5. There is no focal LE neurologic deficit or urinary/bowel incontinence. NSG reviewed case and felt no urgent decompression at this time  7/16 IR reviewed case and percutaneously drained the 5 cm L3 level subdural fluid collection.  7/17 tte no vegetation 7/19 tee no vegetation 7/20 abd/pelv ct with contrast no occult abscess  Wbc improving on vanc, but persistent bacteremia switched to combination abx therapy 7/20 dapto/ceftaroline --> vanc/ceftaroline on 7/21 (dapto resistant on sensitivity testing 7/13)  Discussed with dr Dawley of [facility] on 7/21. Based on ct 7/20 and repeat mri spine no actual target to debride  --------------- 7/25 assessment Stable moderate-severe back pain --- main issue with him now. No neurological deficit 7/22 and 7/24 bcx ngtd since vanc/ceftaroline used Afebrile/wbc improving still    PLAN: Repeat bcx tomorrow Continue vanc/ceftaroline. Plan to do 10-14 days of this combination from the last persistently negative blood culture then switch back to monotherapy with vancomycin Discussed with primary team/id pharmacy   I spent more than 35 minute reviewing data/chart, and coordinating care and >50% direct face to face time providing counseling/discussing diagnostics/treatment plan with patient    Active Problems:   CAD (coronary  artery disease)   HTN (hypertension)   DMII (diabetes mellitus, type 2) (HCC)   Vertebral osteomyelitis (HCC)   MRSA bacteremia   Epidural abscess   Normocytic anemia   Hyponatremia   Anxiety   Chronic diastolic CHF (congestive heart failure) (HCC)   Protein-calorie malnutrition, severe   Discitis   Abnormal EKG   Thrombocytosis   Hypomagnesemia   Hypokalemia   Oral thrush   GERD (gastroesophageal reflux disease)   Cholestasis   No Known Allergies  Scheduled Meds:  aspirin EC  81 mg Oral Daily   atorvastatin  40 mg Oral QHS   enoxaparin (LOVENOX) injection  40 mg Subcutaneous QHS   feeding supplement  237 mL Oral TID BM   furosemide  40 mg Intravenous Daily   gabapentin  200 mg Oral TID   insulin aspart  0-9 Units Subcutaneous Q4H   lactulose  10 g Oral Daily   mouth rinse  15 mL Mouth Rinse BID   metoprolol succinate  25 mg Oral Daily   multivitamin with minerals  1 tablet Oral Daily   nystatin  5 mL Oral QID   oxyCODONE  20 mg Oral Q12H   pantoprazole (PROTONIX) IV  40 mg Intravenous BID   polyethylene glycol  17 g Oral BID   venlafaxine XR  75 mg Oral q morning   Continuous Infusions:  albumin human 25 g (01/17/21 0615)   ceFTAROline (TEFLARO) IV 600 mg (01/17/21 0907)   magnesium sulfate bolus IVPB     potassium chloride     promethazine (PHENERGAN) injection (IM or IVPB) Stopped (01/15/21 1155)   vancomycin 1,750 mg (01/17/21 0143)  PRN Meds:.acetaminophen **OR** [DISCONTINUED] acetaminophen, ALPRAZolam, alum & mag hydroxide-simeth, cyclobenzaprine, diclofenac Sodium, HYDROmorphone (DILAUDID) injection, labetalol, lidocaine, ondansetron **OR** ondansetron (ZOFRAN) IV, promethazine (PHENERGAN) injection (IM or IVPB)   SUBJECTIVE: No f/c Wbc improving 7/22 and 7/24 bcx ngtd No n/v/diarrhea Back pain currently bad waiting for pain meds  Review of Systems: ROS All other ROS was negative, except mentioned above     OBJECTIVE: Vitals:   01/16/21  2303 01/16/21 2305 01/17/21 0303 01/17/21 0700  BP: (!) 162/94 (!) 153/90 123/81 (!) 151/90  Pulse: 99 99 97 96  Resp: 17  13 16   Temp: 98.2 F (36.8 C)  99 F (37.2 C) 99 F (37.2 C)  TempSrc:   Axillary Oral  SpO2: 98%  99% 93%  Weight:       Body mass index is 29.01 kg/m.  Physical Exam General/constitutional: moderate distress awaiting pain medication; conversant; cooperative HEENT: Normocephalic, PER, Conj Clear, EOMI, Oropharynx clear Neck supple CV: rrr no mrg Lungs: clear to auscultation, normal respiratory effort Abd: Soft, Nontender Ext: no edema Skin: No Rash Neuro: nonfocal MSK: no peripheral joint swelling/tenderness/warmth; no back spines tenderness although complaining of pain   Central line presence: no    Lab Results Lab Results  Component Value Date   WBC 11.9 (H) 01/17/2021   HGB 7.8 (L) 01/17/2021   HCT 24.2 (L) 01/17/2021   MCV 87.7 01/17/2021   PLT 586 (H) 01/17/2021    Lab Results  Component Value Date   CREATININE 0.46 (L) 01/17/2021   BUN <5 (L) 01/17/2021   NA 132 (L) 01/17/2021   K 2.9 (L) 01/17/2021   CL 89 (L) 01/17/2021   CO2 33 (H) 01/17/2021    Lab Results  Component Value Date   ALT 11 01/17/2021   AST 13 (L) 01/17/2021   ALKPHOS 66 01/17/2021   BILITOT 1.2 01/17/2021      Microbiology: Recent Results (from the past 240 hour(s))  Aerobic/Anaerobic Culture w Gram Stain (surgical/deep wound)     Status: None   Collection Time: 01/08/21 10:10 AM   Specimen: Wound  Result Value Ref Range Status   Specimen Description WOUND  Final   Special Requests NONE  Final   Gram Stain   Final    ABUNDANT WBC PRESENT, PREDOMINANTLY PMN NO ORGANISMS SEEN    Culture   Final    No growth aerobically or anaerobically. Performed at [facility], [address], [place], [place]    Report Status 01/13/2021 FINAL  Final  Culture, blood (Routine X 2) w Reflex to ID Panel     Status: Abnormal   Collection Time:  01/09/21 10:26 AM   Specimen: BLOOD  Result Value Ref Range Status   Specimen Description BLOOD RIGHT ANTECUBITAL  Final   Special Requests   Final    BOTTLES DRAWN AEROBIC AND ANAEROBIC Blood Culture adequate volume   Culture  Setup Time   Final    GRAM POSITIVE COCCI IN CLUSTERS ANAEROBIC BOTTLE ONLY CRITICAL VALUE NOTED.  VALUE IS CONSISTENT WITH PREVIOUSLY REPORTED AND CALLED VALUE.    Culture (A)  Final    STAPHYLOCOCCUS AUREUS SUSCEPTIBILITIES PERFORMED ON PREVIOUS CULTURE WITHIN THE LAST 5 DAYS. Performed at [facility], [address], [place], [place]    Report Status 01/12/2021 FINAL  Final  Culture, blood (Routine X 2) w Reflex to ID Panel     Status: Abnormal   Collection Time: 01/09/21 10:49 AM   Specimen:  BLOOD LEFT HAND  Result Value Ref Range Status   Specimen Description BLOOD LEFT HAND  Final   Special Requests   Final    BOTTLES DRAWN AEROBIC AND ANAEROBIC Blood Culture results may not be optimal due to an inadequate volume of blood received in culture bottles   Culture  Setup Time   Final    GRAM POSITIVE COCCI IN CLUSTERS IN BOTH AEROBIC AND ANAEROBIC BOTTLES CRITICAL VALUE NOTED.  VALUE IS CONSISTENT WITH PREVIOUSLY REPORTED AND CALLED VALUE.    Culture (A)  Final    STAPHYLOCOCCUS AUREUS SUSCEPTIBILITIES PERFORMED ON PREVIOUS CULTURE WITHIN THE LAST 5 DAYS. Performed at [facility], [address] [place], [place] [address]    Report Status 01/12/2021 FINAL  Final  Culture, blood (routine x 2)     Status: Abnormal   Collection Time: 01/11/21  2:02 PM   Specimen: BLOOD RIGHT HAND  Result Value Ref Range Status   Specimen Description BLOOD RIGHT HAND  Final   Special Requests   Final    BOTTLES DRAWN AEROBIC AND ANAEROBIC Blood Culture adequate volume   Culture  Setup Time   Final    GRAM POSITIVE COCCI IN CLUSTERS IN BOTH AEROBIC AND ANAEROBIC BOTTLES CRITICAL RESULT CALLED TO, READ BACK BY AND VERIFIED WITH: J. LEDFORD  PHARMD, AT 0630 01/12/21 D. VANHOOK    Culture (A)  Final    STAPHYLOCOCCUS AUREUS SUSCEPTIBILITIES PERFORMED ON PREVIOUS CULTURE WITHIN THE LAST 5 DAYS. Performed at [facility], [address], [place], [place] [address]    Report Status 01/14/2021 FINAL  Final  Culture, blood (routine x 2)     Status: Abnormal   Collection Time: 01/11/21  2:03 PM   Specimen: BLOOD LEFT HAND  Result Value Ref Range Status   Specimen Description BLOOD LEFT HAND  Final   Special Requests   Final    BOTTLES DRAWN AEROBIC AND ANAEROBIC Blood Culture adequate volume   Culture  Setup Time   Final    GRAM POSITIVE COCCI IN CLUSTERS ANAEROBIC BOTTLE ONLY CRITICAL VALUE NOTED.  VALUE IS CONSISTENT WITH PREVIOUSLY REPORTED AND CALLED VALUE. Performed at [facility], [address], [place], [place] [address]    Culture METHICILLIN RESISTANT STAPHYLOCOCCUS AUREUS (A)  Final   Report Status 01/14/2021 FINAL  Final   Organism ID, Bacteria METHICILLIN RESISTANT STAPHYLOCOCCUS AUREUS  Final      Susceptibility   Methicillin resistant staphylococcus aureus - MIC*    CIPROFLOXACIN >=8 RESISTANT Resistant     ERYTHROMYCIN >=8 RESISTANT Resistant     GENTAMICIN <=0.5 SENSITIVE Sensitive     OXACILLIN >=4 RESISTANT Resistant     TETRACYCLINE <=1 SENSITIVE Sensitive     VANCOMYCIN <=0.5 SENSITIVE Sensitive     TRIMETH/SULFA >=320 RESISTANT Resistant     CLINDAMYCIN <=0.25 SENSITIVE Sensitive     RIFAMPIN <=0.5 SENSITIVE Sensitive     Inducible Clindamycin NEGATIVE Sensitive     * METHICILLIN RESISTANT STAPHYLOCOCCUS AUREUS  Culture, blood (routine x 2)     Status: Abnormal   Collection Time: 01/12/21 11:47 AM   Specimen: BLOOD RIGHT HAND  Result Value Ref Range Status   Specimen Description BLOOD RIGHT HAND  Final   Special Requests   Final    BOTTLES DRAWN AEROBIC AND ANAEROBIC Blood Culture results may not be optimal due to an inadequate volume of blood received in culture bottles   Culture   Setup Time   Final  GRAM POSITIVE COCCI IN CLUSTERS IN BOTH AEROBIC AND ANAEROBIC BOTTLES CRITICAL VALUE NOTED.  VALUE IS CONSISTENT WITH PREVIOUSLY REPORTED AND CALLED VALUE.    Culture (A)  Final    STAPHYLOCOCCUS AUREUS SUSCEPTIBILITIES PERFORMED ON PREVIOUS CULTURE WITHIN THE LAST 5 DAYS. Performed at [facility], [address] [place], [place] [address]    Report Status 01/15/2021 FINAL  Final  Culture, blood (routine x 2)     Status: None   Collection Time: 01/12/21 11:52 AM   Specimen: BLOOD LEFT HAND  Result Value Ref Range Status   Specimen Description BLOOD LEFT HAND  Final   Special Requests   Final    BOTTLES DRAWN AEROBIC ONLY Blood Culture adequate volume   Culture   Final    NO GROWTH 5 DAYS Performed at [facility], [address], [place], [place] [address]    Report Status 01/17/2021 FINAL  Final  Culture, blood (routine x 2)     Status: None (Preliminary result)   Collection Time: 01/14/21  5:42 AM   Specimen: BLOOD RIGHT HAND  Result Value Ref Range Status   Specimen Description BLOOD RIGHT HAND  Final   Special Requests   Final    BOTTLES DRAWN AEROBIC AND ANAEROBIC Blood Culture adequate volume   Culture   Final    NO GROWTH 3 DAYS Performed at [facility], [address], [place], [place] [address]    Report Status PENDING  Incomplete  Culture, blood (routine x 2)     Status: None (Preliminary result)   Collection Time: 01/14/21  5:42 AM   Specimen: BLOOD LEFT HAND  Result Value Ref Range Status   Specimen Description BLOOD LEFT HAND  Final   Special Requests   Final    BOTTLES DRAWN AEROBIC AND ANAEROBIC Blood Culture adequate volume   Culture   Final    NO GROWTH 3 DAYS Performed at [facility], [address] [place], [place] [address]    Report Status PENDING  Incomplete  Culture, blood (routine x 2)     Status: None (Preliminary result)   Collection Time: 01/16/21  6:43 AM   Specimen: BLOOD RIGHT HAND   Result Value Ref Range Status   Specimen Description BLOOD RIGHT HAND  Final   Special Requests   Final    BOTTLES DRAWN AEROBIC ONLY Blood Culture adequate volume   Culture   Final    NO GROWTH < 24 HOURS Performed at [facility], [address], [place], [place] [address]    Report Status PENDING  Incomplete  Culture, blood (routine x 2)     Status: None (Preliminary result)   Collection Time: 01/16/21  6:44 AM   Specimen: BLOOD LEFT HAND  Result Value Ref Range Status   Specimen Description BLOOD LEFT HAND  Final   Special Requests   Final    BOTTLES DRAWN AEROBIC AND ANAEROBIC Blood Culture adequate volume   Culture   Final    NO GROWTH < 24 HOURS Performed at [facility], [address], [place], [place] [address]    Report Status PENDING  Incomplete     Serology:   Imaging: If present, new imagings (plain films, ct scans, and mri) have been personally visualized and interpreted; radiology reports have been reviewed. Decision making incorporated into the Impression / Recommendations.  7/13 mri thoracic lumbar Paraspinal and other soft tissues: Patient is status post left laminectomy  at L3. Fluid tracks from the laminectomy site into a subcutaneous and deep soft tissue collection with peripheral enhancement measuring 4.7 x 5.0 x 1.7 cm. The ventral epidural collection extends inferiorly to the L2 level. Separate prominent ventral collection is present posterior to L4 prominently right the left. This collection measures 2.9 x 1.3 cm on the sagittal images.   Disc levels:   In addition to the epidural collections, disc disease is again noted at L4-5 and L5-S1. Central foraminal narrowing are associated.   IMPRESSION: 1. T7 laminectomy subcutaneous peripherally enhancing fluid collection at this level as well. 2. Residual posterior epidural fluid collection in the thoracic spine with similar cephalo caudad extension from T5-6-T9 but less mass  effect. 3. Progressive signal abnormality and enhancement throughout the T7 and T8 vertebral bodies in the T7-8 thoracic disc consistent with disc osteomyelitis. 4. Progressive paraspinous soft tissue enhancement at T3-4 through T9-10 without drainable collection. 5. Persistent extensive ventral epidural collection compatible with abscess. 6. Status post left laminectomy at L3 with peripherally enhancing fluid collection extending from the operative site into the subcutaneous and deep tissues as described. This is concerning abscess. 7. Fluid tracks from the laminectomy site into a subdural deep soft tissue collection measuring 4.7 x 5.0 x 1.7 cm. 8. Separate ventral epidural collection at L4-5 and L5-S1 consistent with epidural abscess. 9. Progressive signal abnormality and enhancement in the L3 and L4 vertebral bodies. 10. 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   7/17 tte  1. Left ventricular ejection fraction, by estimation, is 55 to 60%. The  left ventricle has normal function. The left ventricle has no regional  wall motion abnormalities. There is mild concentric left ventricular  hypertrophy.   2. Right ventricular systolic function is normal. The right ventricular  size is normal.   3. The mitral valve is grossly normal. Trivial mitral valve  regurgitation. No evidence of mitral stenosis.   4. The aortic valve is grossly normal. Aortic valve regurgitation is not  visualized. No aortic stenosis is present.   5. The inferior vena cava is normal in size with greater than 50%  respiratory variability, suggesting right atrial pressure of 3 mmHg.   7/19 tee No valvular vegetation  7/20 ct abd pelv with contrast Progressive erosive changes involving the L3 and L4 vertebral bodies in keeping with progressive changes of  discitis osteomyelitis. No pathologic fracture. Increasing paraspinal inflammatory collections adjacent to L3-L5 bilaterally without discrete drainable fluid at this time.   Progressive anasarca with development of pleural effusions, mild ascites, and moderate diffuse subcutaneous body wall edema.   Aortic Atherosclerosis    Raymondo Band, MD [facility] [phone] pager    01/17/2021, 11:30 AM

## 2021-01-18 DIAGNOSIS — M4645 Discitis, unspecified, thoracolumbar region: Secondary | ICD-10-CM | POA: Diagnosis not present

## 2021-01-18 DIAGNOSIS — A4102 Sepsis due to Methicillin resistant Staphylococcus aureus: Secondary | ICD-10-CM | POA: Diagnosis not present

## 2021-01-18 DIAGNOSIS — G062 Extradural and subdural abscess, unspecified: Secondary | ICD-10-CM | POA: Diagnosis not present

## 2021-01-18 DIAGNOSIS — R7881 Bacteremia: Secondary | ICD-10-CM | POA: Diagnosis not present

## 2021-01-18 DIAGNOSIS — A419 Sepsis, unspecified organism: Secondary | ICD-10-CM | POA: Diagnosis not present

## 2021-01-18 LAB — CBC WITH DIFFERENTIAL/PLATELET
Abs Immature Granulocytes: 0.08 10*3/uL — ABNORMAL HIGH (ref 0.00–0.07)
Basophils Absolute: 0 10*3/uL (ref 0.0–0.1)
Basophils Relative: 0 %
Eosinophils Absolute: 0.1 10*3/uL (ref 0.0–0.5)
Eosinophils Relative: 0 %
HCT: 23.6 % — ABNORMAL LOW (ref 39.0–52.0)
Hemoglobin: 7.8 g/dL — ABNORMAL LOW (ref 13.0–17.0)
Immature Granulocytes: 1 %
Lymphocytes Relative: 8 %
Lymphs Abs: 1 10*3/uL (ref 0.7–4.0)
MCH: 28.8 pg (ref 26.0–34.0)
MCHC: 33.1 g/dL (ref 30.0–36.0)
MCV: 87.1 fL (ref 80.0–100.0)
Monocytes Absolute: 1 10*3/uL (ref 0.1–1.0)
Monocytes Relative: 8 %
Neutro Abs: 10.7 10*3/uL — ABNORMAL HIGH (ref 1.7–7.7)
Neutrophils Relative %: 83 %
Platelets: 573 10*3/uL — ABNORMAL HIGH (ref 150–400)
RBC: 2.71 MIL/uL — ABNORMAL LOW (ref 4.22–5.81)
RDW: 15.7 % — ABNORMAL HIGH (ref 11.5–15.5)
WBC: 12.8 10*3/uL — ABNORMAL HIGH (ref 4.0–10.5)
nRBC: 0 % (ref 0.0–0.2)

## 2021-01-18 LAB — COMPREHENSIVE METABOLIC PANEL
ALT: 11 U/L (ref 0–44)
AST: 13 U/L — ABNORMAL LOW (ref 15–41)
Albumin: 2.7 g/dL — ABNORMAL LOW (ref 3.5–5.0)
Alkaline Phosphatase: 64 U/L (ref 38–126)
Anion gap: 8 (ref 5–15)
BUN: 6 mg/dL (ref 6–20)
CO2: 34 mmol/L — ABNORMAL HIGH (ref 22–32)
Calcium: 8.2 mg/dL — ABNORMAL LOW (ref 8.9–10.3)
Chloride: 92 mmol/L — ABNORMAL LOW (ref 98–111)
Creatinine, Ser: 0.53 mg/dL — ABNORMAL LOW (ref 0.61–1.24)
GFR, Estimated: >60 mL/min (ref >60)
Glucose, Bld: 212 mg/dL — ABNORMAL HIGH (ref 70–99)
Potassium: 2.9 mmol/L — ABNORMAL LOW (ref 3.5–5.1)
Sodium: 134 mmol/L — ABNORMAL LOW (ref 135–145)
Total Bilirubin: 0.8 mg/dL (ref 0.3–1.2)
Total Protein: 5.9 g/dL — ABNORMAL LOW (ref 6.5–8.1)

## 2021-01-18 LAB — GLUCOSE, CAPILLARY
Glucose-Capillary: 189 mg/dL — ABNORMAL HIGH (ref 70–99)
Glucose-Capillary: 217 mg/dL — ABNORMAL HIGH (ref 70–99)
Glucose-Capillary: 217 mg/dL — ABNORMAL HIGH (ref 70–99)
Glucose-Capillary: 240 mg/dL — ABNORMAL HIGH (ref 70–99)
Glucose-Capillary: 253 mg/dL — ABNORMAL HIGH (ref 70–99)
Glucose-Capillary: 256 mg/dL — ABNORMAL HIGH (ref 70–99)

## 2021-01-18 LAB — VANCOMYCIN, PEAK
Vancomycin Pk: 26 ug/mL — ABNORMAL LOW (ref 30–40)
Vancomycin Pk: 31 ug/mL (ref 30–40)

## 2021-01-18 LAB — PHOSPHORUS: Phosphorus: 2.8 mg/dL (ref 2.5–4.6)

## 2021-01-18 LAB — MAGNESIUM: Magnesium: 1.9 mg/dL (ref 1.7–2.4)

## 2021-01-18 MED ORDER — MAGNESIUM OXIDE -MG SUPPLEMENT 400 (240 MG) MG PO TABS
800.0000 mg | ORAL_TABLET | Freq: Once | ORAL | Status: AC
  Administered 2021-01-18: 800 mg via NASOGASTRIC
  Filled 2021-01-18: qty 2

## 2021-01-18 MED ORDER — POTASSIUM CHLORIDE 20 MEQ PO PACK
40.0000 meq | PACK | ORAL | Status: AC
  Administered 2021-01-18 (×3): 40 meq
  Filled 2021-01-18 (×3): qty 2

## 2021-01-18 MED ORDER — INSULIN GLARGINE 100 UNIT/ML ~~LOC~~ SOLN
5.0000 [IU] | Freq: Every day | SUBCUTANEOUS | Status: DC
  Administered 2021-01-18: 5 [IU] via SUBCUTANEOUS
  Filled 2021-01-18 (×2): qty 0.05

## 2021-01-18 NOTE — Progress Notes (Signed)
Progress Note    Louis [name]   ZOX:[id number]  DOB: 02-07-1966  DOA: 01/05/2021     12  PCP: Bobette Mo, NP  CC: back pain  Hospital Course: [name] is a 55 year old Caucasian male with PMH significant for but not limited to DMII, HTN, CAD s/p CABG 2017, anxiety, chronic pain as well as other comorbidities with recent admission with an epidural abscess, vertebral osteomyelitis, psoas abscess and MRSA bacteremia who returned to the ED with worsening back pain.    He reported that his back pain has been stable until approximately 5 days PTA when it became more severe and progressively worsened since.  He did not notice any new leg numbness or weakness, but did have some urinary incontinence.  Back pain was reportedly severe and constant; worse with movement and radiating to both legs.   He denied any fevers or chills but stated he did have some mild drainage from his surgical wounds.   He was admitted on 12/12/2020 after found to have an epidural abscess and vertebral osteomyelitis. He underwent a laminectomy and evacuation of abscess on 12/13/2020 and then drainage of the psoas abscess by IR on 12/22/2020.  Blood cultures from 12/13/2020 grew out MRSA but there is no vegetation seen on TTE and blood cultures were drawn and repeated 12/15/2020 remain negative.   He has been on daptomycin at home. On admission, WBC 23.9, PLTC 681k.  COVID PCR was negative.   He had an MRI of the thoracic and lumbar spine which revealed multiple fluid collections involving the ventral epidural abscess at the L4-L5 level with progressive discitis and osteomyelitis at the L4-L5 and L5-S1 levels.  Blood cultures were repeated in the ED.  Initial plan was for lumbar decompression however Dr. Jake Samples of neurosurgery evaluated and felt that his thoracic mass-effect was improved and that even though there was persistent ostial discitis and epidural abscess throughout the thoracolumbar spine there is possibly a more loculated  focus of the abscess at L4-L5 with stenosis but no focal weakness, groin numbness or numbness and tingling in his legs he did not believe that urgent or emergent decompression was needed at that time.   Patient has been confused more often per his wife and neurosurgery is obtaining MRI of the brain with and without contrast given concern of some cerebral spread of infection.  Because he has failed multiple medical treatments will likely need broaden antibiotic therapy and will consult ID and neurosurgery feels that the stenosis L4-L5 could be decompressed however neurologically patient is doing well with no focal deficits and therefore they are recommending aggressive medical and supportive care.   ID changed to IV Vancomycin and consulted IR for drainage of the deep soft tissue collection with peripheral enhancement measuring 4.7 x 5.0 x 1.7 cm.  Dr. Jake Samples of neurosurgery recommending continuing vancomycin and aggressive medical care.   Patient had some nausea and an abnormal EKG so cardiology was consulted for further evaluation and stat troponins were ordered and they were negative.  Cardiology evaluated and felt that we should repeat a limited echo.  Limited echo done and showed no wall motion abnormalities.  There is no evidence of STEMI.   The patient is to undergo a TEE on 01/11/2021.  ID recommending following blood culture susceptibility.  Per ID if WBC remains high and the susceptibility indicates daptomycin remain sensitive they are recommending that we need to discuss with neurosurgery given regarding debridement for source control.  Interval History:  Nausea and  vomiting have significantly improved.  Tolerating tube feeds.  Pain also is more tolerable.  His energy is also seemingly improved some today after having started on tube feeds yesterday.  Wife also present bedside this morning.  We discussed starting to try and work more with physical therapy now as he regains some strength.   Cultures remain negative and updated patient and wife as well.  ROS: Constitutional: negative for chills and fevers, Respiratory: negative for cough and sputum, Cardiovascular: negative for chest pain, and Gastrointestinal: negative for abdominal pain  Assessment & Plan: * Sepsis (HCC)-resolved as of 01/13/2021 - initially: tachycardic, tachypneic, leukocytosis; epidural abscess - see bacteremia and abscess/discitis  Discitis - see abscess  Epidural abscess - s/p laminectomy & evacuation of abscess 12/21/20 - treated with Dapto; developed worsening back pain; MRI L- and T-spine to have multiple fluid collections including ventral epidural abscess and progressive osteomyelitis at L 4-5 - daptomycin MIC was non-susceptible on lab send off from 7/13; might explain why no clearance of bacteremia vs source control - negative TTE and TEE - persistently positive blood cultures, MRSA (see bacteremia) -Neurosurgery also following.  Case rediscussed on 01/12/2021 -Repeat CT abdomen/pelvis being performed on 01/12/2021: no discrete fluid collections - now on dual treatment with vanc and ceftaroline per ID for double coverage; no further plans for surgery still at this time; hopeful that abx will treat infection as a whole  MRSA bacteremia - persistently bacteremic - follow up repeat blood cx 7/22: Negative x 4 days so far - blood cx also repeated on 7/24 per ID, also NGTD - blood cx also being ordered 7/26 per ID, also NGTD - continue vanc and ceftaroline; tentative plan is 10-14 day double coverage from neg cx, then back to mono vanc to complete course - ID following - see epidural abscess  Vertebral osteomyelitis (HCC) - ongoing erosion noted on repeat CT 7/20 of L3 and L4 vertebral bodies; no pathologic fracture  Protein-calorie malnutrition, severe - Patient's BMI is Body mass index is 28.56 kg/m.. - Patient has the following signs/symptoms consistent with PCM: (fat loss, muscle loss,  muscle wasting). - seen by RD, appreciate assistance. Continue plan per RD -Prealbumin 5.9 mg/dL - too much N/V on 1/61; poor intake; may need cortrak to start TF but on hold for now until N/V improves; unable to place CVL for TPN due to persistent bacteremia and high risk of line infection - oral intake remains severely poor - cortrak placed post-pyloric on 7/25; start TF; needs several days of enteral nutrition to rebuild some strength  Acute metabolic encephalopathy-resolved as of 01/13/2021 - patient symptoms include intermittent confusion - etiology considered due to metabolic and infectious etiologies - Mentation has been slowly improving - MRI brain also performed; motion degraded.  Mild generalized atrophy, chronic small vessel disease -Noted that patient was also in significant pain on admission which may have also contributed as well as treatment with pain medications  DMII (diabetes mellitus, type 2) (HCC) - A1c was 9.1% in June 2022  - Check CBGs and use SSI for now - diet liberalized in setting of malnutrition; may need to adjust insulin if CBGs elevate; CBGs a little bit after initiation of tube feeds.  Continue SSI -Starting low-dose Lantus as well  Fall-resolved as of 01/13/2021 -Despite severe weakness, patient fell out of bed on ~01/10/2021? -Repeat head CT showed no acute intracranial abnormalities noted -Fall Precautions -Waist belt for safety if needed  Cholestasis - Cholestasis of sepsis -Likely reactive and  due to infection - trend CMP  GERD (gastroesophageal reflux disease) - continue protonix   Oral thrush - Continue nystatin oral suspension - Completed oral fluconazole x 7-day course  Hypokalemia -Replete and recheck as needed  Hypomagnesemia -Replete and recheck as needed  Thrombocytosis -Likely reactive in the setting of infection -continue [name] CBC  Abnormal EKG -Patient denied Chest Pain but had Nausea but no vomiting -Had ST Elevation in  leads V1-V2 without Reciprocal changes -Troponin Checked and went from 24 -> 31 -Cardiology Consulted and checked Limited ECHO -ECHO showed no WMA -Cardiology does not feel he has a STEMI and thinks that his ST elevations are more pronounced in the setting of pain and hypertension  Chronic diastolic CHF (congestive heart failure) (HCC) - monitoring fluid status and renal function - currently on lasix and albumin  - strict I&O  Anxiety -Continue Venlafaxine XR 75 mg [name] and PRN Xanax  Hyponatremia -Appears chronic, possibly related to his SNRI but may also be hypovolemia from infection/fever/insensible losses; also poor PO intake -Checked urine sodium was 14 and Urine Osm was 484 -BMP [name]  Normocytic anemia -Likely multifactorial in setting of ongoing infection, multiple phlebotomy draws - No overt bleeding  -Checked anemia panel and showed iron level of 19, U IBC 139, TIBC 158, saturation ratios of 12%, ferritin level 864, folate of 2.5, vitamin B12 level 1070 -Continue to monitor for signs and symptoms of bleeding -Continue trending CBC - Hgb drop: 11.6>>8.6 g/dL on 1/61. Vomitus was bilious when seen; check FOBT. At risk for stress ulcer. Start PPI IV BID. Trend CBC  HTN (hypertension) - continue Toprol  - BP otherwise has been stable   CAD (coronary artery disease) - No anginal complaints - s/p CABG -He had a LIMA to LAD, vein graft to distal circumflex and vein graft RCA in 2017. -His catheterization in March 2020 demonstrated patent vein grafts -Continue aspirin, statin, BB - resume ARB as BP trending back up  Increased anion gap metabolic acidosis-resolved as of 01/13/2021 - s/p treatment  Old records reviewed in assessment of this patient  Antimicrobials: Vancomycin 01/06/2021 >> 01/12/2021 Fluconazole 01/09/2021 >> 01/15/2021 Daptomycin Ceftaroline 7/20 >> current Vanc 7/21 >> current  DVT prophylaxis: enoxaparin (LOVENOX) injection 40 mg Start: 01/09/21  1500 SCDs Start: 01/06/21 0224   Code Status:   Code Status: Full Code Family Communication: Wife  Disposition Plan: Status is: Inpatient  Remains inpatient appropriate because:Ongoing active pain requiring inpatient pain management, Ongoing diagnostic testing needed not appropriate for outpatient work up, IV treatments appropriate due to intensity of illness or inability to take PO, and Inpatient level of care appropriate due to severity of illness  Dispo: The patient is from: Home              Anticipated d/c is to:  probably rehab              Patient currently is not medically stable to d/c.   Difficult to place patient No  Risk of unplanned readmission score: Unplanned Admission- Pilot do not use: 34.99   Objective: Blood pressure 119/82, pulse 93, temperature 98.3 F (36.8 C), temperature source Oral, resp. rate 20, weight 94.3 kg, SpO2 98 %.  Examination: General appearance:  Fatigued appearing adult man lying in bed who appears a little more comfortable than previously seen and has no active vomiting Head: Normocephalic, without obvious abnormality, atraumatic Eyes:  EOMI Lungs: clear to auscultation bilaterally Heart: regular rate and rhythm and S1, S2 normal Abdomen:  normal findings: bowel sounds normal and soft, non-tender Extremities: Resolved posterior thigh edema bilaterally.  No bruising or tenderness on palpation of left thigh where patient was complaining of pain. Musculoskeletal: Also able to passively flex his thighs bilaterally with no pain on flexion Skin: mobility and turgor normal Neurologic: Weak throughout (worse in LE) but no focal deficits appreciated  Consultants:  ID Neurosurgery  Cardiology IR  Procedures:  T7-8, T12-L1, L3-L4 laminectomy for Epidural Abscess, 6/28 Left psoas muscle abscess drainage, 6/30 Paraspinal abscess drainage, 7/16  Data Reviewed: I have personally reviewed following labs and imaging studies Results for orders placed  or performed during the hospital encounter of 01/05/21 (from the past 24 hour(s))  Glucose, capillary     Status: Abnormal   Collection Time: 01/17/21  3:40 PM  Result Value Ref Range   Glucose-Capillary 230 (H) 70 - 99 mg/dL  Phosphorus     Status: None   Collection Time: 01/17/21  6:59 PM  Result Value Ref Range   Phosphorus 2.9 2.5 - 4.6 mg/dL  Magnesium     Status: None   Collection Time: 01/17/21  6:59 PM  Result Value Ref Range   Magnesium 2.1 1.7 - 2.4 mg/dL  Potassium     Status: Abnormal   Collection Time: 01/17/21  6:59 PM  Result Value Ref Range   Potassium 3.0 (L) 3.5 - 5.1 mmol/L  Glucose, capillary     Status: Abnormal   Collection Time: 01/17/21  7:30 PM  Result Value Ref Range   Glucose-Capillary 199 (H) 70 - 99 mg/dL  Glucose, capillary     Status: Abnormal   Collection Time: 01/17/21 11:44 PM  Result Value Ref Range   Glucose-Capillary 259 (H) 70 - 99 mg/dL  Glucose, capillary     Status: Abnormal   Collection Time: 01/18/21  3:51 AM  Result Value Ref Range   Glucose-Capillary 217 (H) 70 - 99 mg/dL  Comprehensive metabolic panel     Status: Abnormal   Collection Time: 01/18/21  5:30 AM  Result Value Ref Range   Sodium 134 (L) 135 - 145 mmol/L   Potassium 2.9 (L) 3.5 - 5.1 mmol/L   Chloride 92 (L) 98 - 111 mmol/L   CO2 34 (H) 22 - 32 mmol/L   Glucose, Bld 212 (H) 70 - 99 mg/dL   BUN 6 6 - 20 mg/dL   Creatinine, Ser 7.85 (L) 0.61 - 1.24 mg/dL   Calcium 8.2 (L) 8.9 - 10.3 mg/dL   Total Protein 5.9 (L) 6.5 - 8.1 g/dL   Albumin 2.7 (L) 3.5 - 5.0 g/dL   AST 13 (L) 15 - 41 U/L   ALT 11 0 - 44 U/L   Alkaline Phosphatase 64 38 - 126 U/L   Total Bilirubin 0.8 0.3 - 1.2 mg/dL   GFR, Estimated >88 >50 mL/min   Anion gap 8 5 - 15  Phosphorus     Status: None   Collection Time: 01/18/21  5:30 AM  Result Value Ref Range   Phosphorus 2.8 2.5 - 4.6 mg/dL  Magnesium     Status: None   Collection Time: 01/18/21  5:30 AM  Result Value Ref Range   Magnesium 1.9 1.7  - 2.4 mg/dL  Culture, blood (routine x 2)     Status: None (Preliminary result)   Collection Time: 01/18/21  5:30 AM   Specimen: BLOOD RIGHT HAND  Result Value Ref Range   Specimen Description BLOOD RIGHT HAND    Special Requests  BOTTLES DRAWN AEROBIC ONLY Blood Culture adequate volume   Culture      NO GROWTH < 12 HOURS Performed at [facility], [address], [place], [place] [address]    Report Status PENDING   CBC with Differential/Platelet     Status: Abnormal   Collection Time: 01/18/21  5:30 AM  Result Value Ref Range   WBC 12.8 (H) 4.0 - 10.5 K/uL   RBC 2.71 (L) 4.22 - 5.81 MIL/uL   Hemoglobin 7.8 (L) 13.0 - 17.0 g/dL   HCT 60.4 (L) 54.0 - 98.1 %   MCV 87.1 80.0 - 100.0 fL   MCH 28.8 26.0 - 34.0 pg   MCHC 33.1 30.0 - 36.0 g/dL   RDW 19.1 (H) 47.8 - 29.5 %   Platelets 573 (H) 150 - 400 K/uL   nRBC 0.0 0.0 - 0.2 %   Neutrophils Relative % 83 %   Neutro Abs 10.7 (H) 1.7 - 7.7 K/uL   Lymphocytes Relative 8 %   Lymphs Abs 1.0 0.7 - 4.0 K/uL   Monocytes Relative 8 %   Monocytes Absolute 1.0 0.1 - 1.0 K/uL   Eosinophils Relative 0 %   Eosinophils Absolute 0.1 0.0 - 0.5 K/uL   Basophils Relative 0 %   Basophils Absolute 0.0 0.0 - 0.1 K/uL   Immature Granulocytes 1 %   Abs Immature Granulocytes 0.08 (H) 0.00 - 0.07 K/uL  Culture, blood (routine x 2)     Status: None (Preliminary result)   Collection Time: 01/18/21  5:34 AM   Specimen: BLOOD LEFT HAND  Result Value Ref Range   Specimen Description BLOOD LEFT HAND    Special Requests      BOTTLES DRAWN AEROBIC ONLY Blood Culture adequate volume   Culture      NO GROWTH < 12 HOURS Performed at [facility], [address] [place], [place] [address]    Report Status PENDING   Glucose, capillary     Status: Abnormal   Collection Time: 01/18/21  8:03 AM  Result Value Ref Range   Glucose-Capillary 217 (H) 70 - 99 mg/dL  Vancomycin, peak     Status: Abnormal   Collection Time: 01/18/21  8:56 AM   Result Value Ref Range   Vancomycin Pk 26 (L) 30 - 40 ug/mL  Glucose, capillary     Status: Abnormal   Collection Time: 01/18/21 12:04 PM  Result Value Ref Range   Glucose-Capillary 253 (H) 70 - 99 mg/dL    Recent Results (from the past 240 hour(s))  Culture, blood (Routine X 2) w Reflex to ID Panel     Status: Abnormal   Collection Time: 01/09/21 10:26 AM   Specimen: BLOOD  Result Value Ref Range Status   Specimen Description BLOOD RIGHT ANTECUBITAL  Final   Special Requests   Final    BOTTLES DRAWN AEROBIC AND ANAEROBIC Blood Culture adequate volume   Culture  Setup Time   Final    GRAM POSITIVE COCCI IN CLUSTERS ANAEROBIC BOTTLE ONLY CRITICAL VALUE NOTED.  VALUE IS CONSISTENT WITH PREVIOUSLY REPORTED AND CALLED VALUE.    Culture (A)  Final    STAPHYLOCOCCUS AUREUS SUSCEPTIBILITIES PERFORMED ON PREVIOUS CULTURE WITHIN THE LAST 5 DAYS. Performed at [facility], [address], [place], [place] [address]    Report Status 01/12/2021 FINAL  Final  Culture, blood (Routine X 2) w Reflex to ID Panel     Status: Abnormal   Collection Time: 01/09/21  10:49 AM   Specimen: BLOOD LEFT HAND  Result Value Ref Range Status   Specimen Description BLOOD LEFT HAND  Final   Special Requests   Final    BOTTLES DRAWN AEROBIC AND ANAEROBIC Blood Culture results may not be optimal due to an inadequate volume of blood received in culture bottles   Culture  Setup Time   Final    GRAM POSITIVE COCCI IN CLUSTERS IN BOTH AEROBIC AND ANAEROBIC BOTTLES CRITICAL VALUE NOTED.  VALUE IS CONSISTENT WITH PREVIOUSLY REPORTED AND CALLED VALUE.    Culture (A)  Final    STAPHYLOCOCCUS AUREUS SUSCEPTIBILITIES PERFORMED ON PREVIOUS CULTURE WITHIN THE LAST 5 DAYS. Performed at [facility], [address] [place], [place] [address]    Report Status 01/12/2021 FINAL  Final  Culture, blood (routine x 2)     Status: Abnormal   Collection Time: 01/11/21  2:02 PM   Specimen: BLOOD RIGHT HAND  Result  Value Ref Range Status   Specimen Description BLOOD RIGHT HAND  Final   Special Requests   Final    BOTTLES DRAWN AEROBIC AND ANAEROBIC Blood Culture adequate volume   Culture  Setup Time   Final    GRAM POSITIVE COCCI IN CLUSTERS IN BOTH AEROBIC AND ANAEROBIC BOTTLES CRITICAL RESULT CALLED TO, READ BACK BY AND VERIFIED WITH: J. LEDFORD PHARMD, AT 0630 01/12/21 D. VANHOOK    Culture (A)  Final    STAPHYLOCOCCUS AUREUS SUSCEPTIBILITIES PERFORMED ON PREVIOUS CULTURE WITHIN THE LAST 5 DAYS. Performed at [facility], [address], [place], [place] [address]    Report Status 01/14/2021 FINAL  Final  Culture, blood (routine x 2)     Status: Abnormal   Collection Time: 01/11/21  2:03 PM   Specimen: BLOOD LEFT HAND  Result Value Ref Range Status   Specimen Description BLOOD LEFT HAND  Final   Special Requests   Final    BOTTLES DRAWN AEROBIC AND ANAEROBIC Blood Culture adequate volume   Culture  Setup Time   Final    GRAM POSITIVE COCCI IN CLUSTERS ANAEROBIC BOTTLE ONLY CRITICAL VALUE NOTED.  VALUE IS CONSISTENT WITH PREVIOUSLY REPORTED AND CALLED VALUE. Performed at [facility], [address], [place], [place] [address]    Culture METHICILLIN RESISTANT STAPHYLOCOCCUS AUREUS (A)  Final   Report Status 01/14/2021 FINAL  Final   Organism ID, Bacteria METHICILLIN RESISTANT STAPHYLOCOCCUS AUREUS  Final      Susceptibility   Methicillin resistant staphylococcus aureus - MIC*    CIPROFLOXACIN >=8 RESISTANT Resistant     ERYTHROMYCIN >=8 RESISTANT Resistant     GENTAMICIN <=0.5 SENSITIVE Sensitive     OXACILLIN >=4 RESISTANT Resistant     TETRACYCLINE <=1 SENSITIVE Sensitive     VANCOMYCIN <=0.5 SENSITIVE Sensitive     TRIMETH/SULFA >=320 RESISTANT Resistant     CLINDAMYCIN <=0.25 SENSITIVE Sensitive     RIFAMPIN <=0.5 SENSITIVE Sensitive     Inducible Clindamycin NEGATIVE Sensitive     * METHICILLIN RESISTANT STAPHYLOCOCCUS AUREUS  Culture, blood (routine x 2)      Status: Abnormal   Collection Time: 01/12/21 11:47 AM   Specimen: BLOOD RIGHT HAND  Result Value Ref Range Status   Specimen Description BLOOD RIGHT HAND  Final   Special Requests   Final    BOTTLES DRAWN AEROBIC AND ANAEROBIC Blood Culture results may not be optimal due to an inadequate volume of blood received in culture bottles   Culture  Setup Time  Final    GRAM POSITIVE COCCI IN CLUSTERS IN BOTH AEROBIC AND ANAEROBIC BOTTLES CRITICAL VALUE NOTED.  VALUE IS CONSISTENT WITH PREVIOUSLY REPORTED AND CALLED VALUE.    Culture (A)  Final    STAPHYLOCOCCUS AUREUS SUSCEPTIBILITIES PERFORMED ON PREVIOUS CULTURE WITHIN THE LAST 5 DAYS. Performed at [facility], [address] [place], [place] [address]    Report Status 01/15/2021 FINAL  Final  Culture, blood (routine x 2)     Status: None   Collection Time: 01/12/21 11:52 AM   Specimen: BLOOD LEFT HAND  Result Value Ref Range Status   Specimen Description BLOOD LEFT HAND  Final   Special Requests   Final    BOTTLES DRAWN AEROBIC ONLY Blood Culture adequate volume   Culture   Final    NO GROWTH 5 DAYS Performed at [facility], [address] [place], [place] [address]    Report Status 01/17/2021 FINAL  Final  Culture, blood (routine x 2)     Status: None (Preliminary result)   Collection Time: 01/14/21  5:42 AM   Specimen: BLOOD RIGHT HAND  Result Value Ref Range Status   Specimen Description BLOOD RIGHT HAND  Final   Special Requests   Final    BOTTLES DRAWN AEROBIC AND ANAEROBIC Blood Culture adequate volume   Culture   Final    NO GROWTH 4 DAYS Performed at [facility], [address], [place], [place] [address]    Report Status PENDING  Incomplete  Culture, blood (routine x 2)     Status: None (Preliminary result)   Collection Time: 01/14/21  5:42 AM   Specimen: BLOOD LEFT HAND  Result Value Ref Range Status   Specimen Description BLOOD LEFT HAND  Final   Special Requests   Final    BOTTLES DRAWN  AEROBIC AND ANAEROBIC Blood Culture adequate volume   Culture   Final    NO GROWTH 4 DAYS Performed at [facility], [address], [place], [place] [address]    Report Status PENDING  Incomplete  Culture, blood (routine x 2)     Status: None (Preliminary result)   Collection Time: 01/16/21  6:43 AM   Specimen: BLOOD RIGHT HAND  Result Value Ref Range Status   Specimen Description BLOOD RIGHT HAND  Final   Special Requests   Final    BOTTLES DRAWN AEROBIC ONLY Blood Culture adequate volume   Culture   Final    NO GROWTH 2 DAYS Performed at [facility], [address], [place], [place] [address]    Report Status PENDING  Incomplete  Culture, blood (routine x 2)     Status: None (Preliminary result)   Collection Time: 01/16/21  6:44 AM   Specimen: BLOOD LEFT HAND  Result Value Ref Range Status   Specimen Description BLOOD LEFT HAND  Final   Special Requests   Final    BOTTLES DRAWN AEROBIC AND ANAEROBIC Blood Culture adequate volume   Culture   Final    NO GROWTH 2 DAYS Performed at [facility], [address] [place], [place] [address]    Report Status PENDING  Incomplete  Culture, blood (routine x 2)     Status: None (Preliminary result)   Collection Time: 01/18/21  5:30 AM   Specimen: BLOOD RIGHT HAND  Result Value Ref Range Status   Specimen Description BLOOD RIGHT HAND  Final   Special Requests   Final  BOTTLES DRAWN AEROBIC ONLY Blood Culture adequate volume   Culture   Final    NO GROWTH < 12 HOURS Performed at [facility], [address], [place], [place] [address]    Report Status PENDING  Incomplete  Culture, blood (routine x 2)     Status: None (Preliminary result)   Collection Time: 01/18/21  5:34 AM   Specimen: BLOOD LEFT HAND  Result Value Ref Range Status   Specimen Description BLOOD LEFT HAND  Final   Special Requests   Final    BOTTLES DRAWN AEROBIC ONLY Blood Culture adequate volume   Culture   Final    NO GROWTH < 12  HOURS Performed at [facility], [address] [place], [place] [address]    Report Status PENDING  Incomplete     Radiology Studies: DG Abd Portable 1V  Result Date: 01/17/2021 CLINICAL DATA:  Feeding tube placement EXAM: PORTABLE ABDOMEN - 1 VIEW COMPARISON:  None FINDINGS: There is been interval placement a weighted feeding tube. The tip of the feeding tube is in the left upper quadrant of the abdomen. This may be looped within the stomach with tip in the gastric fundus versus post pyloric with tip at the duodenal jejunal junction. If post pyloric placement of the feeding tube is desired injection of a small volume contrast material through the feeding tube may confirm location. IMPRESSION: Interval placement of weighted feeding tube with tip in the gastric fundus versus duodenal jejunal junction. If post pyloric placement of the feeding tube is desired, injection of a small volume water soluble contrast material through the tube may confirm location. Electronically Signed   By: Signa Kell M.D.   On: 01/17/2021 11:56   DG Abd Portable 1V  Final Result    CT ABDOMEN PELVIS W CONTRAST  Final Result    [place] FNA SOFT TISSUE  Final Result    CT PELVIS W CONTRAST  Final Result    CT HEAD WO CONTRAST  Final Result    MR BRAIN W WO CONTRAST  Final Result    MR Lumbar Spine W Wo Contrast  Final Result    MR THORACIC SPINE W WO CONTRAST  Final Result      Scheduled Meds:  aspirin EC  81 mg Oral [name]   atorvastatin  40 mg Oral QHS   enoxaparin (LOVENOX) injection  40 mg Subcutaneous QHS   feeding supplement  237 mL Oral TID BM   feeding supplement (PROSource TF)  45 mL Per Tube BID   gabapentin  200 mg Oral TID   insulin aspart  0-9 Units Subcutaneous Q4H   insulin glargine  5 Units Subcutaneous [name]   lactulose  10 g Oral [name]   losartan  25 mg Oral q morning   magnesium oxide  800 mg Per NG tube Once   mouth rinse  15 mL Mouth Rinse BID   metoprolol succinate   25 mg Oral [name]   multivitamin with minerals  1 tablet Oral [name]   nystatin  5 mL Oral QID   oxyCODONE  20 mg Oral Q12H   pantoprazole (PROTONIX) IV  40 mg Intravenous BID   polyethylene glycol  17 g Oral BID   potassium chloride  40 mEq Per Tube Q4H   venlafaxine XR  75 mg Oral q morning   PRN Meds: acetaminophen **OR** [DISCONTINUED] acetaminophen, ALPRAZolam, alum & mag hydroxide-simeth, cyclobenzaprine, diclofenac Sodium, HYDROmorphone (DILAUDID) injection, HYDROmorphone (DILAUDID) injection, labetalol, lidocaine,  ondansetron **OR** ondansetron (ZOFRAN) IV, promethazine (PHENERGAN) injection (IM or IVPB) Continuous Infusions:  ceFTAROline (TEFLARO) IV 600 mg (01/18/21 0939)   feeding supplement (OSMOLITE 1.5 CAL) 45 mL/hr at 01/18/21 1322   promethazine (PHENERGAN) injection (IM or IVPB) Stopped (01/15/21 1155)   vancomycin 1,750 mg (01/18/21 0529)     LOS: 12 days  Time spent: Greater than 50% of the 35 minute visit was spent in counseling/coordination of care for the patient as laid out in the A&P.   Lewie Chamberavid Caelin Rosen, MD [facility] 01/18/2021, 1:50 PM

## 2021-01-18 NOTE — Progress Notes (Signed)
Tilt bed would not power on.  New tilt bed requested.

## 2021-01-18 NOTE — Progress Notes (Signed)
[facility]  Date of Admission:  01/05/2021     CC: Mrsa bacteremia  Lines: Peripheral iv's  Abx: 7/20-c ceftaroline 7/14-20; 7/21-c vanc  7/20 dapto  Previously daptomycin  ASSESSMENT: 55 yo male recent mrsa bacteremia/epidural abscess/OM and psoas abscess s/p open debridement 6/28 and IR drainage of left psoas discharged on daptomycin, now readmitted 7/13 for worsening back pain of a few days found to have recurrent mrsa bacteremia  7/13 & 7/15 & 7/17 & 7/19 & 7/20 bcx mrsa   7/13 mri lumbar/thoracic showed several areas of fluid collection with a more loculated focus at L4-5. There is no focal LE neurologic deficit or urinary/bowel incontinence. NSG reviewed case and felt no urgent decompression at this time  7/16 IR reviewed case and percutaneously drained the 5 cm L3 level subdural fluid collection.  7/17 tte no vegetation 7/19 tee no vegetation 7/20 abd/pelv ct with contrast no occult abscess  Wbc improving on vanc, but persistent bacteremia switched to combination abx therapy 7/20 dapto/ceftaroline --> vanc/ceftaroline on 7/21 (dapto resistant on sensitivity testing 7/13)  Discussed with dr Dawley of [facility] on 7/21. Based on ct 7/20 and repeat mri spine no actual target to debride  --------------- 7/26 assessment Today patient without much complaint about back pain  Wbc minimally elevated (had much improved) Afebrile Bcx from 7/22 and 7/24 remains ngtd; 7/26 bcx sent    PLAN: F/u repeat bcx Continue combination vanc/ceftaroline with plan for 2 weeks from hopefully 7/22 prior to transitioning back to vancomycin monotherapy Will plan to repeat mri thoracolumbar spine in 2 weeks Discussed with primary team/id pharmacy  I spent more than 35 minute reviewing data/chart, and coordinating care and >50% direct face to face time providing counseling/discussing diagnostics/treatment plan with patient    Active Problems:   CAD  (coronary artery disease)   HTN (hypertension)   DMII (diabetes mellitus, type 2) (HCC)   Vertebral osteomyelitis (HCC)   MRSA bacteremia   Epidural abscess   Normocytic anemia   Hyponatremia   Anxiety   Chronic diastolic CHF (congestive heart failure) (HCC)   Protein-calorie malnutrition, severe   Discitis   Abnormal EKG   Thrombocytosis   Hypomagnesemia   Hypokalemia   Oral thrush   GERD (gastroesophageal reflux disease)   Cholestasis   No Known Allergies  Scheduled Meds:  aspirin EC  81 mg Oral Daily   atorvastatin  40 mg Oral QHS   enoxaparin (LOVENOX) injection  40 mg Subcutaneous QHS   feeding supplement  237 mL Oral TID BM   feeding supplement (PROSource TF)  45 mL Per Tube BID   gabapentin  200 mg Oral TID   insulin aspart  0-9 Units Subcutaneous Q4H   insulin glargine  5 Units Subcutaneous Daily   lactulose  10 g Oral Daily   losartan  25 mg Oral q morning   magnesium oxide  800 mg Per NG tube Once   mouth rinse  15 mL Mouth Rinse BID   metoprolol succinate  25 mg Oral Daily   multivitamin with minerals  1 tablet Oral Daily   nystatin  5 mL Oral QID   oxyCODONE  20 mg Oral Q12H   pantoprazole (PROTONIX) IV  40 mg Intravenous BID   polyethylene glycol  17 g Oral BID   potassium chloride  40 mEq Per Tube Q4H   venlafaxine XR  75 mg Oral q morning   Continuous Infusions:  ceFTAROline (  TEFLARO) IV 600 mg (01/18/21 0939)   feeding supplement (OSMOLITE 1.5 CAL) 45 mL/hr at 01/18/21 1322   promethazine (PHENERGAN) injection (IM or IVPB) Stopped (01/15/21 1155)   vancomycin 1,750 mg (01/18/21 0529)   PRN Meds:.acetaminophen **OR** [DISCONTINUED] acetaminophen, ALPRAZolam, alum & mag hydroxide-simeth, cyclobenzaprine, diclofenac Sodium, HYDROmorphone (DILAUDID) injection, HYDROmorphone (DILAUDID) injection, labetalol, lidocaine, ondansetron **OR** ondansetron (ZOFRAN) IV, promethazine (PHENERGAN) injection (IM or IVPB)   SUBJECTIVE: Patient feeling much better.  Said back pain well controlled today No new other joint pain No cough/chest pain No n/v/diarrhea No rash No LE deficit No urinary/bowel incontinence  Afebrile Wbc around 12 Repeat bcx from 7/22 onward remains negative  Review of Systems: ROS All other ROS was negative, except mentioned above     OBJECTIVE: Vitals:   01/17/21 2341 01/18/21 0348 01/18/21 0806 01/18/21 1208  BP: (!) 157/82 (!) 156/83 131/84 119/82  Pulse: (!) 106 (!) 104 99 93  Resp: 20 20 20 20   Temp: 98.5 F (36.9 C) 97.7 F (36.5 C) 98.4 F (36.9 C) 98.3 F (36.8 C)  TempSrc: Axillary Axillary Oral Oral  SpO2: 98% 97% 98% 98%  Weight:       Body mass index is 29.01 kg/m.  Physical Exam General/constitutional: no distress, pleasant HEENT: Normocephalic, PER, Conj Clear, EOMI, Oropharynx clear Neck supple CV: rrr no mrg Lungs: clear to auscultation, normal respiratory effort Abd: Soft, Nontender Ext: no edema Skin: No Rash Neuro: nonfocal MSK: no peripheral joint swelling/tenderness/warmth; back spines nontender   Central line presence: no    Lab Results Lab Results  Component Value Date   WBC 12.8 (H) 01/18/2021   HGB 7.8 (L) 01/18/2021   HCT 23.6 (L) 01/18/2021   MCV 87.1 01/18/2021   PLT 573 (H) 01/18/2021    Lab Results  Component Value Date   CREATININE 0.53 (L) 01/18/2021   BUN 6 01/18/2021   NA 134 (L) 01/18/2021   K 2.9 (L) 01/18/2021   CL 92 (L) 01/18/2021   CO2 34 (H) 01/18/2021    Lab Results  Component Value Date   ALT 11 01/18/2021   AST 13 (L) 01/18/2021   ALKPHOS 64 01/18/2021   BILITOT 0.8 01/18/2021      Microbiology: Recent Results (from the past 240 hour(s))  Culture, blood (Routine X 2) w Reflex to ID Panel     Status: Abnormal   Collection Time: 01/09/21 10:26 AM   Specimen: BLOOD  Result Value Ref Range Status   Specimen Description BLOOD RIGHT ANTECUBITAL  Final   Special Requests   Final    BOTTLES DRAWN AEROBIC AND ANAEROBIC Blood Culture  adequate volume   Culture  Setup Time   Final    GRAM POSITIVE COCCI IN CLUSTERS ANAEROBIC BOTTLE ONLY CRITICAL VALUE NOTED.  VALUE IS CONSISTENT WITH PREVIOUSLY REPORTED AND CALLED VALUE.    Culture (A)  Final    STAPHYLOCOCCUS AUREUS SUSCEPTIBILITIES PERFORMED ON PREVIOUS CULTURE WITHIN THE LAST 5 DAYS. Performed at [facility], [address], [place], [place]    Report Status 01/12/2021 FINAL  Final  Culture, blood (Routine X 2) w Reflex to ID Panel     Status: Abnormal   Collection Time: 01/09/21 10:49 AM   Specimen: BLOOD LEFT HAND  Result Value Ref Range Status   Specimen Description BLOOD LEFT HAND  Final   Special Requests   Final    BOTTLES DRAWN AEROBIC AND ANAEROBIC Blood Culture results may not be optimal due to an inadequate  volume of blood received in culture bottles   Culture  Setup Time   Final    GRAM POSITIVE COCCI IN CLUSTERS IN BOTH AEROBIC AND ANAEROBIC BOTTLES CRITICAL VALUE NOTED.  VALUE IS CONSISTENT WITH PREVIOUSLY REPORTED AND CALLED VALUE.    Culture (A)  Final    STAPHYLOCOCCUS AUREUS SUSCEPTIBILITIES PERFORMED ON PREVIOUS CULTURE WITHIN THE LAST 5 DAYS. Performed at [facility], [address], [place], [place] [address]    Report Status 01/12/2021 FINAL  Final  Culture, blood (routine x 2)     Status: Abnormal   Collection Time: 01/11/21  2:02 PM   Specimen: BLOOD RIGHT HAND  Result Value Ref Range Status   Specimen Description BLOOD RIGHT HAND  Final   Special Requests   Final    BOTTLES DRAWN AEROBIC AND ANAEROBIC Blood Culture adequate volume   Culture  Setup Time   Final    GRAM POSITIVE COCCI IN CLUSTERS IN BOTH AEROBIC AND ANAEROBIC BOTTLES CRITICAL RESULT CALLED TO, READ BACK BY AND VERIFIED WITH: J. LEDFORD PHARMD, AT 0630 01/12/21 D. VANHOOK    Culture (A)  Final    STAPHYLOCOCCUS AUREUS SUSCEPTIBILITIES PERFORMED ON PREVIOUS CULTURE WITHIN THE LAST 5 DAYS. Performed at [facility], [address], [place], [place] [address]    Report Status 01/14/2021 FINAL  Final  Culture, blood (routine x 2)     Status: Abnormal   Collection Time: 01/11/21  2:03 PM   Specimen: BLOOD LEFT HAND  Result Value Ref Range Status   Specimen Description BLOOD LEFT HAND  Final   Special Requests   Final    BOTTLES DRAWN AEROBIC AND ANAEROBIC Blood Culture adequate volume   Culture  Setup Time   Final    GRAM POSITIVE COCCI IN CLUSTERS ANAEROBIC BOTTLE ONLY CRITICAL VALUE NOTED.  VALUE IS CONSISTENT WITH PREVIOUSLY REPORTED AND CALLED VALUE. Performed at [facility], [address] [place], [place] [address]    Culture METHICILLIN RESISTANT STAPHYLOCOCCUS AUREUS (A)  Final   Report Status 01/14/2021 FINAL  Final   Organism ID, Bacteria METHICILLIN RESISTANT STAPHYLOCOCCUS AUREUS  Final      Susceptibility   Methicillin resistant staphylococcus aureus - MIC*    CIPROFLOXACIN >=8 RESISTANT Resistant     ERYTHROMYCIN >=8 RESISTANT Resistant     GENTAMICIN <=0.5 SENSITIVE Sensitive     OXACILLIN >=4 RESISTANT Resistant     TETRACYCLINE <=1 SENSITIVE Sensitive     VANCOMYCIN <=0.5 SENSITIVE Sensitive     TRIMETH/SULFA >=320 RESISTANT Resistant     CLINDAMYCIN <=0.25 SENSITIVE Sensitive     RIFAMPIN <=0.5 SENSITIVE Sensitive     Inducible Clindamycin NEGATIVE Sensitive     * METHICILLIN RESISTANT STAPHYLOCOCCUS AUREUS  Culture, blood (routine x 2)     Status: Abnormal   Collection Time: 01/12/21 11:47 AM   Specimen: BLOOD RIGHT HAND  Result Value Ref Range Status   Specimen Description BLOOD RIGHT HAND  Final   Special Requests   Final    BOTTLES DRAWN AEROBIC AND ANAEROBIC Blood Culture results may not be optimal due to an inadequate volume of blood received in culture bottles   Culture  Setup Time   Final    GRAM POSITIVE COCCI IN CLUSTERS IN BOTH AEROBIC AND ANAEROBIC BOTTLES CRITICAL VALUE NOTED.  VALUE IS CONSISTENT WITH PREVIOUSLY REPORTED AND CALLED VALUE.    Culture (A)  Final     STAPHYLOCOCCUS AUREUS SUSCEPTIBILITIES PERFORMED ON PREVIOUS CULTURE WITHIN THE  LAST 5 DAYS. Performed at [facility], [address] [place], [place] [address]    Report Status 01/15/2021 FINAL  Final  Culture, blood (routine x 2)     Status: None   Collection Time: 01/12/21 11:52 AM   Specimen: BLOOD LEFT HAND  Result Value Ref Range Status   Specimen Description BLOOD LEFT HAND  Final   Special Requests   Final    BOTTLES DRAWN AEROBIC ONLY Blood Culture adequate volume   Culture   Final    NO GROWTH 5 DAYS Performed at [facility], [address] [place], [place] [address]    Report Status 01/17/2021 FINAL  Final  Culture, blood (routine x 2)     Status: None (Preliminary result)   Collection Time: 01/14/21  5:42 AM   Specimen: BLOOD RIGHT HAND  Result Value Ref Range Status   Specimen Description BLOOD RIGHT HAND  Final   Special Requests   Final    BOTTLES DRAWN AEROBIC AND ANAEROBIC Blood Culture adequate volume   Culture   Final    NO GROWTH 4 DAYS Performed at [facility], [address] [place], [place] [address]    Report Status PENDING  Incomplete  Culture, blood (routine x 2)     Status: None (Preliminary result)   Collection Time: 01/14/21  5:42 AM   Specimen: BLOOD LEFT HAND  Result Value Ref Range Status   Specimen Description BLOOD LEFT HAND  Final   Special Requests   Final    BOTTLES DRAWN AEROBIC AND ANAEROBIC Blood Culture adequate volume   Culture   Final    NO GROWTH 4 DAYS Performed at [facility], [address] [place], [place] [address]    Report Status PENDING  Incomplete  Culture, blood (routine x 2)     Status: None (Preliminary result)   Collection Time: 01/16/21  6:43 AM   Specimen: BLOOD RIGHT HAND  Result Value Ref Range Status   Specimen Description BLOOD RIGHT HAND  Final   Special Requests   Final    BOTTLES DRAWN AEROBIC ONLY Blood Culture adequate volume   Culture   Final    NO GROWTH 2  DAYS Performed at [facility], [address] [place], [place] [address]    Report Status PENDING  Incomplete  Culture, blood (routine x 2)     Status: None (Preliminary result)   Collection Time: 01/16/21  6:44 AM   Specimen: BLOOD LEFT HAND  Result Value Ref Range Status   Specimen Description BLOOD LEFT HAND  Final   Special Requests   Final    BOTTLES DRAWN AEROBIC AND ANAEROBIC Blood Culture adequate volume   Culture   Final    NO GROWTH 2 DAYS Performed at [facility], [address] [place], [place] [address]    Report Status PENDING  Incomplete  Culture, blood (routine x 2)     Status: None (Preliminary result)   Collection Time: 01/18/21  5:30 AM   Specimen: BLOOD RIGHT HAND  Result Value Ref Range Status   Specimen Description BLOOD RIGHT HAND  Final   Special Requests   Final    BOTTLES DRAWN AEROBIC ONLY Blood Culture adequate volume   Culture   Final    NO GROWTH < 12 HOURS Performed at [facility], [address] [place], [place] [address]    Report Status PENDING  Incomplete  Culture,  blood (routine x 2)     Status: None (Preliminary result)   Collection Time: 01/18/21  5:34 AM   Specimen: BLOOD LEFT HAND  Result Value Ref Range Status   Specimen Description BLOOD LEFT HAND  Final   Special Requests   Final    BOTTLES DRAWN AEROBIC ONLY Blood Culture adequate volume   Culture   Final    NO GROWTH < 12 HOURS Performed at [facility], [address], [place], [place] [address]    Report Status PENDING  Incomplete     Serology:   Imaging: If present, new imagings (plain films, ct scans, and mri) have been personally visualized and interpreted; radiology reports have been reviewed. Decision making incorporated into the Impression / Recommendations.  7/13 mri thoracic lumbar Paraspinal and other soft tissues: Patient is status post left laminectomy at L3. Fluid tracks from the laminectomy site into a subcutaneous and deep  soft tissue collection with peripheral enhancement measuring 4.7 x 5.0 x 1.7 cm. The ventral epidural collection extends inferiorly to the L2 level. Separate prominent ventral collection is present posterior to L4 prominently right the left. This collection measures 2.9 x 1.3 cm on the sagittal images.   Disc levels:   In addition to the epidural collections, disc disease is again noted at L4-5 and L5-S1. Central foraminal narrowing are associated.   IMPRESSION: 1. T7 laminectomy subcutaneous peripherally enhancing fluid collection at this level as well. 2. Residual posterior epidural fluid collection in the thoracic spine with similar cephalo caudad extension from T5-6-T9 but less mass effect. 3. Progressive signal abnormality and enhancement throughout the T7 and T8 vertebral bodies in the T7-8 thoracic disc consistent with disc osteomyelitis. 4. Progressive paraspinous soft tissue enhancement at T3-4 through T9-10 without drainable collection. 5. Persistent extensive ventral epidural collection compatible with abscess. 6. Status post left laminectomy at L3 with peripherally enhancing fluid collection extending from the operative site into the subcutaneous and deep tissues as described. This is concerning abscess. 7. Fluid tracks from the laminectomy site into a subdural deep soft tissue collection measuring 4.7 x 5.0 x 1.7 cm. 8. Separate ventral epidural collection at L4-5 and L5-S1 consistent with epidural abscess. 9. Progressive signal abnormality and enhancement in the L3 and L4 vertebral bodies. 10. 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   7/17 tte  1. Left ventricular ejection fraction, by estimation, is 55 to 60%. The  left ventricle has normal function. The left ventricle has no regional  wall motion  abnormalities. There is mild concentric left ventricular  hypertrophy.   2. Right ventricular systolic function is normal. The right ventricular  size is normal.   3. The mitral valve is grossly normal. Trivial mitral valve  regurgitation. No evidence of mitral stenosis.   4. The aortic valve is grossly normal. Aortic valve regurgitation is not  visualized. No aortic stenosis is present.   5. The inferior vena cava is normal in size with greater than 50%  respiratory variability, suggesting right atrial pressure of 3 mmHg.   7/19 tee No valvular vegetation  7/20 ct abd pelv with contrast Progressive erosive changes involving the L3 and L4 vertebral bodies in keeping with progressive changes of discitis osteomyelitis. No pathologic fracture. Increasing paraspinal inflammatory collections adjacent to L3-L5 bilaterally without discrete drainable fluid at this time.   Progressive anasarca with development of pleural  effusions, mild ascites, and moderate diffuse subcutaneous body wall edema.   Aortic Atherosclerosis    Raymondo Band, MD [facility] [phone] pager    01/18/2021, 2:28 PM

## 2021-01-19 DIAGNOSIS — M4620 Osteomyelitis of vertebra, site unspecified: Secondary | ICD-10-CM | POA: Diagnosis not present

## 2021-01-19 DIAGNOSIS — E1169 Type 2 diabetes mellitus with other specified complication: Secondary | ICD-10-CM | POA: Diagnosis not present

## 2021-01-19 DIAGNOSIS — A4102 Sepsis due to Methicillin resistant Staphylococcus aureus: Secondary | ICD-10-CM | POA: Diagnosis not present

## 2021-01-19 DIAGNOSIS — E43 Unspecified severe protein-calorie malnutrition: Secondary | ICD-10-CM | POA: Diagnosis not present

## 2021-01-19 DIAGNOSIS — Z794 Long term (current) use of insulin: Secondary | ICD-10-CM

## 2021-01-19 LAB — CBC WITH DIFFERENTIAL/PLATELET
Abs Immature Granulocytes: 0.05 10*3/uL (ref 0.00–0.07)
Basophils Absolute: 0.1 10*3/uL (ref 0.0–0.1)
Basophils Relative: 0 %
Eosinophils Absolute: 0.2 10*3/uL (ref 0.0–0.5)
Eosinophils Relative: 2 %
HCT: 24.3 % — ABNORMAL LOW (ref 39.0–52.0)
Hemoglobin: 8 g/dL — ABNORMAL LOW (ref 13.0–17.0)
Immature Granulocytes: 0 %
Lymphocytes Relative: 15 %
Lymphs Abs: 1.7 10*3/uL (ref 0.7–4.0)
MCH: 28.8 pg (ref 26.0–34.0)
MCHC: 32.9 g/dL (ref 30.0–36.0)
MCV: 87.4 fL (ref 80.0–100.0)
Monocytes Absolute: 1.1 10*3/uL — ABNORMAL HIGH (ref 0.1–1.0)
Monocytes Relative: 10 %
Neutro Abs: 8.3 10*3/uL — ABNORMAL HIGH (ref 1.7–7.7)
Neutrophils Relative %: 73 %
Platelets: 517 10*3/uL — ABNORMAL HIGH (ref 150–400)
RBC: 2.78 MIL/uL — ABNORMAL LOW (ref 4.22–5.81)
RDW: 15.6 % — ABNORMAL HIGH (ref 11.5–15.5)
WBC: 11.4 10*3/uL — ABNORMAL HIGH (ref 4.0–10.5)
nRBC: 0 % (ref 0.0–0.2)

## 2021-01-19 LAB — CULTURE, BLOOD (ROUTINE X 2)
Culture: NO GROWTH
Culture: NO GROWTH
Special Requests: ADEQUATE
Special Requests: ADEQUATE

## 2021-01-19 LAB — COMPREHENSIVE METABOLIC PANEL
ALT: 10 U/L (ref 0–44)
AST: 11 U/L — ABNORMAL LOW (ref 15–41)
Albumin: 2.5 g/dL — ABNORMAL LOW (ref 3.5–5.0)
Alkaline Phosphatase: 67 U/L (ref 38–126)
Anion gap: 7 (ref 5–15)
BUN: 7 mg/dL (ref 6–20)
CO2: 33 mmol/L — ABNORMAL HIGH (ref 22–32)
Calcium: 8 mg/dL — ABNORMAL LOW (ref 8.9–10.3)
Chloride: 93 mmol/L — ABNORMAL LOW (ref 98–111)
Creatinine, Ser: 0.42 mg/dL — ABNORMAL LOW (ref 0.61–1.24)
GFR, Estimated: >60 mL/min (ref >60)
Glucose, Bld: 261 mg/dL — ABNORMAL HIGH (ref 70–99)
Potassium: 3.7 mmol/L (ref 3.5–5.1)
Sodium: 133 mmol/L — ABNORMAL LOW (ref 135–145)
Total Bilirubin: 0.7 mg/dL (ref 0.3–1.2)
Total Protein: 5.5 g/dL — ABNORMAL LOW (ref 6.5–8.1)

## 2021-01-19 LAB — MAGNESIUM: Magnesium: 1.9 mg/dL (ref 1.7–2.4)

## 2021-01-19 LAB — PHOSPHORUS: Phosphorus: 2.7 mg/dL (ref 2.5–4.6)

## 2021-01-19 LAB — GLUCOSE, CAPILLARY
Glucose-Capillary: 262 mg/dL — ABNORMAL HIGH (ref 70–99)
Glucose-Capillary: 268 mg/dL — ABNORMAL HIGH (ref 70–99)
Glucose-Capillary: 269 mg/dL — ABNORMAL HIGH (ref 70–99)
Glucose-Capillary: 276 mg/dL — ABNORMAL HIGH (ref 70–99)
Glucose-Capillary: 292 mg/dL — ABNORMAL HIGH (ref 70–99)
Glucose-Capillary: 305 mg/dL — ABNORMAL HIGH (ref 70–99)
Glucose-Capillary: 320 mg/dL — ABNORMAL HIGH (ref 70–99)

## 2021-01-19 LAB — VANCOMYCIN, TROUGH: Vancomycin Tr: 14 ug/mL — ABNORMAL LOW (ref 15–20)

## 2021-01-19 MED ORDER — INSULIN GLARGINE 100 UNIT/ML ~~LOC~~ SOLN
8.0000 [IU] | Freq: Every day | SUBCUTANEOUS | Status: DC
  Administered 2021-01-19: 8 [IU] via SUBCUTANEOUS
  Filled 2021-01-19 (×2): qty 0.08

## 2021-01-19 MED ORDER — HYDROMORPHONE HCL 1 MG/ML IJ SOLN: 0.5000 mg | Freq: Four times a day (QID) | INTRAMUSCULAR | Status: DC | PRN

## 2021-01-19 MED ORDER — POLYETHYLENE GLYCOL 3350 17 G PO PACK
17.0000 g | PACK | Freq: Two times a day (BID) | ORAL | Status: DC
  Administered 2021-01-19 (×2): 17 g via NASOGASTRIC
  Filled 2021-01-19 (×4): qty 1

## 2021-01-19 MED ORDER — LACTULOSE 10 GM/15ML PO SOLN
10.0000 g | Freq: Two times a day (BID) | ORAL | Status: DC
  Administered 2021-01-19 (×2): 10 g
  Filled 2021-01-19 (×4): qty 15

## 2021-01-19 MED ORDER — POTASSIUM PHOSPHATES 15 MMOLE/5ML IV SOLN
20.0000 mmol | Freq: Once | INTRAVENOUS | Status: AC
  Administered 2021-01-19: 20 mmol via INTRAVENOUS
  Filled 2021-01-19: qty 6.67

## 2021-01-19 MED ORDER — OXYCODONE HCL ER 10 MG PO T12A
10.0000 mg | EXTENDED_RELEASE_TABLET | Freq: Two times a day (BID) | ORAL | Status: DC
  Administered 2021-01-19 – 2021-01-28 (×18): 10 mg via ORAL
  Filled 2021-01-19 (×20): qty 1

## 2021-01-19 MED ORDER — MAGNESIUM SULFATE 2 GM/50ML IV SOLN
2.0000 g | Freq: Once | INTRAVENOUS | Status: AC
  Administered 2021-01-19: 2 g via INTRAVENOUS
  Filled 2021-01-19: qty 50

## 2021-01-19 MED ORDER — OXYCODONE HCL 5 MG PO TABS
5.0000 mg | ORAL_TABLET | ORAL | Status: DC | PRN
Start: 2021-01-19 — End: 2021-01-28
  Administered 2021-01-20 – 2021-01-28 (×18): 5 mg via ORAL
  Filled 2021-01-19 (×22): qty 1

## 2021-01-19 MED ORDER — SENNOSIDES 8.8 MG/5ML PO SYRP
10.0000 mL | ORAL_SOLUTION | Freq: Two times a day (BID) | ORAL | Status: DC
  Administered 2021-01-19: 10 mL
  Filled 2021-01-19 (×5): qty 10

## 2021-01-19 NOTE — Progress Notes (Addendum)
Pharmacy Antibiotic Note  Louis [name] is a 55 y.o. male admitted on 01/05/2021 with Sepsis with epidural abscess with MRSA bacteremia/lumbar osteomyelitis. Plan is to continue vancomycin/cefaroline dual therapy for 2 weeks from 7/22 prior to transitioning to vancomycin monotherapy, per ID recommendations. Pharmacy has been consulted for vancomycin dosing.  Vancomycin peak is 31 and appropriately drawn, vancomycin trough 14 ug/ml drawn ~1h early, with calculated AUC 523 within AUC goal 400-550. Patient's renal function has been stable. WBC downtrending.  Plan: Continue Vancomycin 1750mg  IV q12 hrs for new anticipated AUC 523 Continue Ceftaroline 600mg  IV q8hr Continue monitoring renal function and clinical course  Antimicrobials this admission: Vancomycin 7/14>>7/20, 7/21>> Ceftaroline 7/20>> Daptomycin 7/20 x1  Microbiology results: 7/13 BCx: MRSA 7/15 BCx >>1/3 MRSA 7/17 BCx >> 3/4 GPCs in clusters  7/19 Bcx >> MRSA 7/20 Bcx >> GPC 7/22 Bcx >> no growth x 4 days 7/24 Bcx >> no growth x 2 days 7/26 Bcx >> no growth x 12 h   Weight: 94.3 kg (208 lb)  Temp (24hrs), Avg:98.5 F (36.9 C), Min:98.3 F (36.8 C), Max:99 F (37.2 C)  Recent Labs  Lab 01/15/21 0725 01/15/21 1137 01/16/21 0643 01/17/21 0220 01/18/21 0530 01/18/21 0856 01/18/21 1957 01/19/21 0347  WBC 19.1*  --  13.8* 11.9* 12.8*  --   --  11.4*  CREATININE 0.48*  --  0.44* 0.46* 0.53*  --   --  0.42*  VANCOTROUGH  --  21*  --   --   --   --   --  14*  VANCOPEAK 28*  --   --   --   --  26* 31  --      Estimated Creatinine Clearance: 119.5 mL/min (A) (by C-G formula based on SCr of 0.42 mg/dL (L)).    No Known Allergies  Thank you for involving pharmacy in this patient's care.  01/20/21, PharmD PGY1 Ambulatory Care Pharmacy Resident 01/19/2021 8:32 AM  **Pharmacist phone directory can be found on [email or web address] listed under [facility]**

## 2021-01-19 NOTE — Progress Notes (Signed)
Inpatient Diabetes Program Recommendations  AACE/ADA: New Consensus Statement on Inpatient Glycemic Control   Target Ranges:  Prepandial:   less than 140 mg/dL      Peak postprandial:   less than 180 mg/dL (1-2 hours)      Critically ill patients:  140 - 180 mg/dL   Results for Louis [name], Louis [name] (MRN [id number]) as of 01/19/2021 12:51  Ref. Range 01/18/2021 08:03 01/18/2021 12:04 01/18/2021 15:53 01/18/2021 19:31 01/18/2021 23:33 01/19/2021 03:44 01/19/2021 08:56 01/19/2021 11:56  Glucose-Capillary Latest Ref Range: 70 - 99 mg/dL 045 (H) 997 (H) 741 (H) 189 (H) 240 (H) 268 (H) 269 (H) 292 (H)   Review of Glycemic Control  Diabetes history: DM2 Outpatient Diabetes medications: Farxiga 10 mg daily, Glipizide 5 mg BID, Metformin 1000 mg QAM, Metformin 500 mg QPM Current orders for Inpatient glycemic control: Lantus 8 units daily, Novolog 0-9 units Q4H  Inpatient Diabetes Program Recommendations:    Insulin: Please consider increasing Lantus to 12 units daily and consider ordering Novolog 4 units TID with meals for meal coverage if patient eats at least 50% of meals.  Diet: Please discontinue Regular diet and order Carb Modified diet.  Thanks, Orlando Penner, RN, MSN, CDE Diabetes Coordinator Inpatient Diabetes Program [phone] (Team Pager from 8am to 5pm)

## 2021-01-19 NOTE — Progress Notes (Addendum)
Progress Note    Louis [name]   QIO:[id number]  DOB: 07-Feb-1966  DOA: 01/05/2021     13  PCP: Bobette Mo, NP  CC: back pain  Hospital Course: [name] is a 55 year old Caucasian male with PMH significant for but not limited to DMII, HTN, CAD s/p CABG 2017, anxiety, chronic pain as well as other comorbidities with recent admission with an epidural abscess, vertebral osteomyelitis, psoas abscess and MRSA bacteremia who returned to the ED with worsening back pain.    He reported that his back pain has been stable until approximately 5 days PTA when it became more severe and progressively worsened since.  He did not notice any new leg numbness or weakness, but did have some urinary incontinence.  Back pain was reportedly severe and constant; worse with movement and radiating to both legs.   He denied any fevers or chills but stated he did have some mild drainage from his surgical wounds.   He was admitted on 12/12/2020 after found to have an epidural abscess and vertebral osteomyelitis. He underwent a laminectomy and evacuation of abscess on 12/13/2020 and then drainage of the psoas abscess by IR on 12/22/2020.  Blood cultures from 12/13/2020 grew out MRSA but there is no vegetation seen on TTE and blood cultures were drawn and repeated 12/15/2020 remain negative.   He has been on daptomycin at home. On admission, WBC 23.9, PLTC 681k.  COVID PCR was negative.   He had an MRI of the thoracic and lumbar spine which revealed multiple fluid collections involving the ventral epidural abscess at the L4-L5 level with progressive discitis and osteomyelitis at the L4-L5 and L5-S1 levels.  Blood cultures were repeated in the ED.  Initial plan was for lumbar decompression however Dr. Jake Samples of neurosurgery evaluated and felt that his thoracic mass-effect was improved and that even though there was persistent ostial discitis and epidural abscess throughout the thoracolumbar spine there is possibly a more loculated  focus of the abscess at L4-L5 with stenosis but no focal weakness, groin numbness or numbness and tingling in his legs he did not believe that urgent or emergent decompression was needed at that time.   Patient has been confused more often per his wife and neurosurgery is obtaining MRI of the brain with and without contrast given concern of some cerebral spread of infection.  Because he has failed multiple medical treatments will likely need broaden antibiotic therapy and will consult ID and neurosurgery feels that the stenosis L4-L5 could be decompressed however neurologically patient is doing well with no focal deficits and therefore they are recommending aggressive medical and supportive care.   ID changed to IV Vancomycin and consulted IR for drainage of the deep soft tissue collection with peripheral enhancement measuring 4.7 x 5.0 x 1.7 cm.  Dr. Jake Samples of neurosurgery recommending continuing vancomycin and aggressive medical care.   Patient had some nausea and an abnormal EKG so cardiology was consulted for further evaluation and stat troponins were ordered and they were negative.  Cardiology evaluated and felt that we should repeat a limited echo.  Limited echo done and showed no wall motion abnormalities.  There is no evidence of STEMI.   The patient is to undergo a TEE on 01/11/2021.  ID recommending following blood culture susceptibility.  Per ID if WBC remains high and the susceptibility indicates daptomycin remain sensitive they are recommending that we need to discuss with neurosurgery given regarding debridement for source control.  Interval History:  No events  overnight.  He is a little more lethargic this morning but comfortable.  Did appear a little confused as well.  His pain regimen was adjusted some, likely this is some oversedation.  ROS: Constitutional: negative for chills and fevers, Respiratory: negative for cough and sputum, Cardiovascular: negative for chest pain, and  Gastrointestinal: negative for abdominal pain  Assessment & Plan: * Sepsis (HCC)-resolved as of 01/13/2021 - initially: tachycardic, tachypneic, leukocytosis; epidural abscess - see bacteremia and abscess/discitis  Discitis - see abscess  Epidural abscess - s/p laminectomy & evacuation of abscess 12/21/20 - treated with Dapto; developed worsening back pain; MRI L- and T-spine to have multiple fluid collections including ventral epidural abscess and progressive osteomyelitis at L 4-5 - daptomycin MIC was non-susceptible on lab send off from 7/13; might explain why no clearance of bacteremia vs source control - negative TTE and TEE - persistently positive blood cultures, MRSA (see bacteremia) -Neurosurgery also following.  Case rediscussed on 01/12/2021 -Repeat CT abdomen/pelvis being performed on 01/12/2021: no discrete fluid collections - now on dual treatment with vanc and ceftaroline per ID for double coverage; no further plans for surgery still at this time; hopeful that abx will treat infection as a whole  MRSA bacteremia - persistently bacteremic - follow up repeat blood cx 7/22: Negative x 4 days so far - blood cx also repeated on 7/24 per ID, also NGTD - blood cx also being ordered 7/26 per ID, also NGTD - continue vanc and ceftaroline; tentative plan is 10-14 day double coverage from neg cx, then back to mono vanc to complete course - ID following - see epidural abscess  Vertebral osteomyelitis (HCC) - ongoing erosion noted on repeat CT 7/20 of L3 and L4 vertebral bodies; no pathologic fracture - starting to be lethargic; likely too much opioids now; regimen adjusted on 7/27; will need further slow tapering as he recovers some  Protein-calorie malnutrition, severe - Patient's BMI is Body mass index is 28.56 kg/m.. - Patient has the following signs/symptoms consistent with PCM: (fat loss, muscle loss, muscle wasting). - seen by RD, appreciate assistance. Continue plan per  RD -Prealbumin 5.9 mg/dL - too much N/V on 1/61; poor intake; may need cortrak to start TF but on hold for now until N/V improves; unable to place CVL for TPN due to persistent bacteremia and high risk of line infection - oral intake remains severely poor - cortrak placed post-pyloric on 7/25; start TF; needs several days of enteral nutrition to rebuild some strength  Acute metabolic encephalopathy-resolved as of 01/13/2021 - patient symptoms include intermittent confusion - etiology considered due to metabolic and infectious etiologies - Mentation has been slowly improving - MRI brain also performed; motion degraded.  Mild generalized atrophy, chronic small vessel disease -Noted that patient was also in significant pain on admission which may have also contributed as well as treatment with pain medications  DMII (diabetes mellitus, type 2) (HCC) - A1c was 9.1% in June 2022  - Check CBGs and use SSI for now - diet liberalized in setting of malnutrition; may need to adjust insulin if CBGs elevate; CBGs a little bit after initiation of tube feeds.  Continue SSI -Starting Lantus as CBG now uptrending on TF; will adjust as needed  Fall-resolved as of 01/13/2021 -Despite severe weakness, patient fell out of bed on ~01/10/2021? -Repeat head CT showed no acute intracranial abnormalities noted -Fall Precautions -Waist belt for safety if needed  Cholestasis - Cholestasis of sepsis -Likely reactive and due to infection -  trend CMP  GERD (gastroesophageal reflux disease) - continue protonix   Oral thrush - s/p nystatin oral suspension x 10 days; may need to be continued if recurs/ongoing - Completed oral fluconazole x 7-day course  Hypokalemia -Replete and recheck as needed  Hypomagnesemia -Replete and recheck as needed  Thrombocytosis -Likely reactive in the setting of infection -continue daily CBC  Abnormal EKG -Patient denied Chest Pain but had Nausea but no vomiting -Had ST  Elevation in leads V1-V2 without Reciprocal changes -Troponin Checked and went from 24 -> 31 -Cardiology Consulted and checked Limited ECHO -ECHO showed no WMA -Cardiology does not feel he has a STEMI and thinks that his ST elevations are more pronounced in the setting of pain and hypertension  Chronic diastolic CHF (congestive heart failure) (HCC) - monitoring fluid status and renal function - currently on lasix and albumin  - strict I&O  Anxiety -Continue Venlafaxine XR 75 mg daily and PRN Xanax  Hyponatremia -Appears chronic, possibly related to his SNRI but may also be hypovolemia from infection/fever/insensible losses; also poor PO intake -Checked urine sodium was 14 and Urine Osm was 484 -BMP daily  Normocytic anemia -Likely multifactorial in setting of ongoing infection, multiple phlebotomy draws - No overt bleeding  -Checked anemia panel and showed iron level of 19, U IBC 139, TIBC 158, saturation ratios of 12%, ferritin level 864, folate of 2.5, vitamin B12 level 1070 -Continue to monitor for signs and symptoms of bleeding -Continue trending CBC - Hgb drop: 11.6>>8.6 g/dL on 0/81. Vomitus was bilious when seen; check FOBT. At risk for stress ulcer. Start PPI IV BID. Trend CBC  HTN (hypertension) - continue Toprol  - BP otherwise has been stable   CAD (coronary artery disease) - No anginal complaints - s/p CABG -He had a LIMA to LAD, vein graft to distal circumflex and vein graft RCA in 2017. -His catheterization in March 2020 demonstrated patent vein grafts -Continue aspirin, statin, BB - resume ARB as BP trending back up  Increased anion gap metabolic acidosis-resolved as of 01/13/2021 - s/p treatment  Old records reviewed in assessment of this patient  Antimicrobials: Vancomycin 01/06/2021 >> 01/12/2021 Fluconazole 01/09/2021 >> 01/15/2021 Daptomycin Ceftaroline 7/20 >> current Vanc 7/21 >> current  DVT prophylaxis: enoxaparin (LOVENOX) injection 40 mg Start:  01/09/21 1500 SCDs Start: 01/06/21 0224   Code Status:   Code Status: Full Code Family Communication: Wife  Disposition Plan: Status is: Inpatient  Remains inpatient appropriate because:Ongoing active pain requiring inpatient pain management, Ongoing diagnostic testing needed not appropriate for outpatient work up, IV treatments appropriate due to intensity of illness or inability to take PO, and Inpatient level of care appropriate due to severity of illness  Dispo: The patient is from: Home              Anticipated d/c is to:  probably rehab              Patient currently is not medically stable to d/c.   Difficult to place patient No  Risk of unplanned readmission score: Unplanned Admission- Pilot do not use: 35.32   Objective: Blood pressure (!) 152/87, pulse 98, temperature 98.7 F (37.1 C), temperature source Axillary, resp. rate 16, weight 94.3 kg, SpO2 99 %.  Examination: General appearance:  Fatigued appearing adult man lying in bed who appears a little more comfortable than previously seen and has no active vomiting Head: Normocephalic, without obvious abnormality, atraumatic Eyes:  EOMI Lungs: clear to auscultation bilaterally Heart: regular rate  and rhythm and S1, S2 normal Abdomen: normal findings: bowel sounds normal and soft, non-tender Extremities: Resolved posterior thigh edema bilaterally.  No bruising or tenderness on palpation of left thigh where patient was complaining of pain. Musculoskeletal: Also able to passively flex his thighs bilaterally with no pain on flexion Skin: mobility and turgor normal Neurologic: Weak throughout (worse in LE) but no focal deficits appreciated  Consultants:  ID Neurosurgery  Cardiology IR  Procedures:  T7-8, T12-L1, L3-L4 laminectomy for Epidural Abscess, 6/28 Left psoas muscle abscess drainage, 6/30 Paraspinal abscess drainage, 7/16  Data Reviewed: I have personally reviewed following labs and imaging studies Results  for orders placed or performed during the hospital encounter of 01/05/21 (from the past 24 hour(s))  Glucose, capillary     Status: Abnormal   Collection Time: 01/18/21 12:04 PM  Result Value Ref Range   Glucose-Capillary 253 (H) 70 - 99 mg/dL  Glucose, capillary     Status: Abnormal   Collection Time: 01/18/21  3:53 PM  Result Value Ref Range   Glucose-Capillary 256 (H) 70 - 99 mg/dL  Glucose, capillary     Status: Abnormal   Collection Time: 01/18/21  7:31 PM  Result Value Ref Range   Glucose-Capillary 189 (H) 70 - 99 mg/dL  Vancomycin, peak     Status: None   Collection Time: 01/18/21  7:57 PM  Result Value Ref Range   Vancomycin Pk 31 30 - 40 ug/mL  Glucose, capillary     Status: Abnormal   Collection Time: 01/18/21 11:33 PM  Result Value Ref Range   Glucose-Capillary 240 (H) 70 - 99 mg/dL  Glucose, capillary     Status: Abnormal   Collection Time: 01/19/21  3:44 AM  Result Value Ref Range   Glucose-Capillary 268 (H) 70 - 99 mg/dL  CBC with Differential/Platelet     Status: Abnormal   Collection Time: 01/19/21  3:47 AM  Result Value Ref Range   WBC 11.4 (H) 4.0 - 10.5 K/uL   RBC 2.78 (L) 4.22 - 5.81 MIL/uL   Hemoglobin 8.0 (L) 13.0 - 17.0 g/dL   HCT 40.9 (L) 81.1 - 91.4 %   MCV 87.4 80.0 - 100.0 fL   MCH 28.8 26.0 - 34.0 pg   MCHC 32.9 30.0 - 36.0 g/dL   RDW 78.2 (H) 95.6 - 21.3 %   Platelets 517 (H) 150 - 400 K/uL   nRBC 0.0 0.0 - 0.2 %   Neutrophils Relative % 73 %   Neutro Abs 8.3 (H) 1.7 - 7.7 K/uL   Lymphocytes Relative 15 %   Lymphs Abs 1.7 0.7 - 4.0 K/uL   Monocytes Relative 10 %   Monocytes Absolute 1.1 (H) 0.1 - 1.0 K/uL   Eosinophils Relative 2 %   Eosinophils Absolute 0.2 0.0 - 0.5 K/uL   Basophils Relative 0 %   Basophils Absolute 0.1 0.0 - 0.1 K/uL   Immature Granulocytes 0 %   Abs Immature Granulocytes 0.05 0.00 - 0.07 K/uL  Comprehensive metabolic panel     Status: Abnormal   Collection Time: 01/19/21  3:47 AM  Result Value Ref Range   Sodium  133 (L) 135 - 145 mmol/L   Potassium 3.7 3.5 - 5.1 mmol/L   Chloride 93 (L) 98 - 111 mmol/L   CO2 33 (H) 22 - 32 mmol/L   Glucose, Bld 261 (H) 70 - 99 mg/dL   BUN 7 6 - 20 mg/dL   Creatinine, Ser 0.86 (L) 0.61 - 1.24 mg/dL  Calcium 8.0 (L) 8.9 - 10.3 mg/dL   Total Protein 5.5 (L) 6.5 - 8.1 g/dL   Albumin 2.5 (L) 3.5 - 5.0 g/dL   AST 11 (L) 15 - 41 U/L   ALT 10 0 - 44 U/L   Alkaline Phosphatase 67 38 - 126 U/L   Total Bilirubin 0.7 0.3 - 1.2 mg/dL   GFR, Estimated >28 >78 mL/min   Anion gap 7 5 - 15  Magnesium     Status: None   Collection Time: 01/19/21  3:47 AM  Result Value Ref Range   Magnesium 1.9 1.7 - 2.4 mg/dL  Phosphorus     Status: None   Collection Time: 01/19/21  3:47 AM  Result Value Ref Range   Phosphorus 2.7 2.5 - 4.6 mg/dL  Vancomycin, trough     Status: Abnormal   Collection Time: 01/19/21  3:47 AM  Result Value Ref Range   Vancomycin Tr 14 (L) 15 - 20 ug/mL  Glucose, capillary     Status: Abnormal   Collection Time: 01/19/21  8:56 AM  Result Value Ref Range   Glucose-Capillary 269 (H) 70 - 99 mg/dL    Recent Results (from the past 240 hour(s))  Culture, blood (routine x 2)     Status: Abnormal   Collection Time: 01/11/21  2:02 PM   Specimen: BLOOD RIGHT HAND  Result Value Ref Range Status   Specimen Description BLOOD RIGHT HAND  Final   Special Requests   Final    BOTTLES DRAWN AEROBIC AND ANAEROBIC Blood Culture adequate volume   Culture  Setup Time   Final    GRAM POSITIVE COCCI IN CLUSTERS IN BOTH AEROBIC AND ANAEROBIC BOTTLES CRITICAL RESULT CALLED TO, READ BACK BY AND VERIFIED WITH: J. LEDFORD PHARMD, AT 0630 01/12/21 D. VANHOOK    Culture (A)  Final    STAPHYLOCOCCUS AUREUS SUSCEPTIBILITIES PERFORMED ON PREVIOUS CULTURE WITHIN THE LAST 5 DAYS. Performed at [facility], [address], [place], [place] [address]    Report Status 01/14/2021 FINAL  Final  Culture, blood (routine x 2)     Status: Abnormal   Collection Time: 01/11/21  2:03  PM   Specimen: BLOOD LEFT HAND  Result Value Ref Range Status   Specimen Description BLOOD LEFT HAND  Final   Special Requests   Final    BOTTLES DRAWN AEROBIC AND ANAEROBIC Blood Culture adequate volume   Culture  Setup Time   Final    GRAM POSITIVE COCCI IN CLUSTERS ANAEROBIC BOTTLE ONLY CRITICAL VALUE NOTED.  VALUE IS CONSISTENT WITH PREVIOUSLY REPORTED AND CALLED VALUE. Performed at [facility], [address], [place], [place] [address]    Culture METHICILLIN RESISTANT STAPHYLOCOCCUS AUREUS (A)  Final   Report Status 01/14/2021 FINAL  Final   Organism ID, Bacteria METHICILLIN RESISTANT STAPHYLOCOCCUS AUREUS  Final      Susceptibility   Methicillin resistant staphylococcus aureus - MIC*    CIPROFLOXACIN >=8 RESISTANT Resistant     ERYTHROMYCIN >=8 RESISTANT Resistant     GENTAMICIN <=0.5 SENSITIVE Sensitive     OXACILLIN >=4 RESISTANT Resistant     TETRACYCLINE <=1 SENSITIVE Sensitive     VANCOMYCIN <=0.5 SENSITIVE Sensitive     TRIMETH/SULFA >=320 RESISTANT Resistant     CLINDAMYCIN <=0.25 SENSITIVE Sensitive     RIFAMPIN <=0.5 SENSITIVE Sensitive     Inducible Clindamycin NEGATIVE Sensitive     * METHICILLIN RESISTANT STAPHYLOCOCCUS AUREUS  Culture, blood (routine x 2)  Status: Abnormal   Collection Time: 01/12/21 11:47 AM   Specimen: BLOOD RIGHT HAND  Result Value Ref Range Status   Specimen Description BLOOD RIGHT HAND  Final   Special Requests   Final    BOTTLES DRAWN AEROBIC AND ANAEROBIC Blood Culture results may not be optimal due to an inadequate volume of blood received in culture bottles   Culture  Setup Time   Final    GRAM POSITIVE COCCI IN CLUSTERS IN BOTH AEROBIC AND ANAEROBIC BOTTLES CRITICAL VALUE NOTED.  VALUE IS CONSISTENT WITH PREVIOUSLY REPORTED AND CALLED VALUE.    Culture (A)  Final    STAPHYLOCOCCUS AUREUS SUSCEPTIBILITIES PERFORMED ON PREVIOUS CULTURE WITHIN THE LAST 5 DAYS. Performed at [facility], [address],  [place], [place] [address]    Report Status 01/15/2021 FINAL  Final  Culture, blood (routine x 2)     Status: None   Collection Time: 01/12/21 11:52 AM   Specimen: BLOOD LEFT HAND  Result Value Ref Range Status   Specimen Description BLOOD LEFT HAND  Final   Special Requests   Final    BOTTLES DRAWN AEROBIC ONLY Blood Culture adequate volume   Culture   Final    NO GROWTH 5 DAYS Performed at [facility], [address], [place], [place] [address]    Report Status 01/17/2021 FINAL  Final  Culture, blood (routine x 2)     Status: None (Preliminary result)   Collection Time: 01/14/21  5:42 AM   Specimen: BLOOD RIGHT HAND  Result Value Ref Range Status   Specimen Description BLOOD RIGHT HAND  Final   Special Requests   Final    BOTTLES DRAWN AEROBIC AND ANAEROBIC Blood Culture adequate volume   Culture   Final    NO GROWTH 4 DAYS Performed at [facility], [address] [place], [place] [address]    Report Status PENDING  Incomplete  Culture, blood (routine x 2)     Status: None (Preliminary result)   Collection Time: 01/14/21  5:42 AM   Specimen: BLOOD LEFT HAND  Result Value Ref Range Status   Specimen Description BLOOD LEFT HAND  Final   Special Requests   Final    BOTTLES DRAWN AEROBIC AND ANAEROBIC Blood Culture adequate volume   Culture   Final    NO GROWTH 4 DAYS Performed at [facility], [address], [place], [place] [address]    Report Status PENDING  Incomplete  Culture, blood (routine x 2)     Status: None (Preliminary result)   Collection Time: 01/16/21  6:43 AM   Specimen: BLOOD RIGHT HAND  Result Value Ref Range Status   Specimen Description BLOOD RIGHT HAND  Final   Special Requests   Final    BOTTLES DRAWN AEROBIC ONLY Blood Culture adequate volume   Culture   Final    NO GROWTH 2 DAYS Performed at [facility], [address] [place], [place] [address]    Report Status PENDING  Incomplete  Culture, blood (routine x 2)      Status: None (Preliminary result)   Collection Time: 01/16/21  6:44 AM   Specimen: BLOOD LEFT HAND  Result Value Ref Range Status   Specimen Description BLOOD LEFT HAND  Final   Special Requests   Final    BOTTLES DRAWN AEROBIC AND ANAEROBIC Blood Culture adequate volume   Culture   Final    NO GROWTH 2 DAYS  Performed at [facility], [address], [place], [place] [address]    Report Status PENDING  Incomplete  Culture, blood (routine x 2)     Status: None (Preliminary result)   Collection Time: 01/18/21  5:30 AM   Specimen: BLOOD RIGHT HAND  Result Value Ref Range Status   Specimen Description BLOOD RIGHT HAND  Final   Special Requests   Final    BOTTLES DRAWN AEROBIC ONLY Blood Culture adequate volume   Culture   Final    NO GROWTH < 12 HOURS Performed at [facility], [address], [place], [place] [address]    Report Status PENDING  Incomplete  Culture, blood (routine x 2)     Status: None (Preliminary result)   Collection Time: 01/18/21  5:34 AM   Specimen: BLOOD LEFT HAND  Result Value Ref Range Status   Specimen Description BLOOD LEFT HAND  Final   Special Requests   Final    BOTTLES DRAWN AEROBIC ONLY Blood Culture adequate volume   Culture   Final    NO GROWTH < 12 HOURS Performed at [facility], [address], [place], [place] [address]    Report Status PENDING  Incomplete     Radiology Studies: DG Abd Portable 1V  Result Date: 01/17/2021 CLINICAL DATA:  Feeding tube placement EXAM: PORTABLE ABDOMEN - 1 VIEW COMPARISON:  None FINDINGS: There is been interval placement a weighted feeding tube. The tip of the feeding tube is in the left upper quadrant of the abdomen. This may be looped within the stomach with tip in the gastric fundus versus post pyloric with tip at the duodenal jejunal junction. If post pyloric placement of the feeding tube is desired injection of a small volume contrast material through the feeding tube may confirm location.  IMPRESSION: Interval placement of weighted feeding tube with tip in the gastric fundus versus duodenal jejunal junction. If post pyloric placement of the feeding tube is desired, injection of a small volume water soluble contrast material through the tube may confirm location. Electronically Signed   By: Signa Kell M.D.   On: 01/17/2021 11:56   DG Abd Portable 1V  Final Result    CT ABDOMEN PELVIS W CONTRAST  Final Result    [place] FNA SOFT TISSUE  Final Result    CT PELVIS W CONTRAST  Final Result    CT HEAD WO CONTRAST  Final Result    MR BRAIN W WO CONTRAST  Final Result    MR Lumbar Spine W Wo Contrast  Final Result    MR THORACIC SPINE W WO CONTRAST  Final Result      Scheduled Meds:  aspirin EC  81 mg Oral Daily   atorvastatin  40 mg Oral QHS   enoxaparin (LOVENOX) injection  40 mg Subcutaneous QHS   feeding supplement  237 mL Oral TID BM   feeding supplement (PROSource TF)  45 mL Per Tube BID   gabapentin  200 mg Oral TID   insulin aspart  0-9 Units Subcutaneous Q4H   insulin glargine  8 Units Subcutaneous Daily   lactulose  10 g Per Tube BID   losartan  25 mg Oral q morning   mouth rinse  15 mL Mouth Rinse BID   metoprolol succinate  25 mg Oral Daily   multivitamin with minerals  1 tablet Oral Daily   oxyCODONE  10 mg Oral Q12H   pantoprazole (PROTONIX) IV  40 mg  Intravenous BID   polyethylene glycol  17 g Per NG tube BID   sennosides  10 mL Per Tube BID   venlafaxine XR  75 mg Oral q morning   PRN Meds: acetaminophen **OR** [DISCONTINUED] acetaminophen, ALPRAZolam, alum & mag hydroxide-simeth, cyclobenzaprine, diclofenac Sodium, HYDROmorphone (DILAUDID) injection, labetalol, lidocaine, ondansetron **OR** ondansetron (ZOFRAN) IV, oxyCODONE, promethazine (PHENERGAN) injection (IM or IVPB) Continuous Infusions:  ceFTAROline (TEFLARO) IV 600 mg (01/19/21 1022)   feeding supplement (OSMOLITE 1.5 CAL) 1,000 mL (01/18/21 2125)   potassium PHOSPHATE IVPB (in mmol)  20 mmol (01/19/21 0934)   promethazine (PHENERGAN) injection (IM or IVPB) Stopped (01/15/21 1155)   vancomycin 1,750 mg (01/19/21 0608)     LOS: 13 days  Time spent: Greater than 50% of the 35 minute visit was spent in counseling/coordination of care for the patient as laid out in the A&P.   Lewie Chamberavid Elliott Lasecki, MD [facility] 01/19/2021, 11:26 AM

## 2021-01-19 NOTE — Progress Notes (Signed)
Physical Therapy Treatment Patient Details Name: Louis [name] MRN: [id number] DOB: Nov 16, 1965 Today's Date: 01/19/2021    History of Present Illness Pt is a 55 y.o. male who presented 7/13 with worsening back pain with recurrent MRSA bacteremia after discharging 7/3 from being admitted with epidural abscess, vertebral osteomyelitis L3-S1, psoas abscess, and MRSA bacteremia with s/p 12/21/20 T7-8, T12-L1, L3-4 laminectomies for evacuation of epidural abscess. 7/13 mri lumbar/thoracic showed several areas of fluid collection with a more loculated focus at L4-5. 7/20 imaging revealed progressive erosive changes involving the L3 and L4 vertebral bodies  in keeping with progressive changes of discitis osteomyelitis. PMH: CAD, hypertension, uncontrolled diabetes mellitus.    PT Comments    Began with lower extremity warm-up exercises while supine in bed to attempt to encourage improved pain tolerance to mobility. Pt displaying improved active participation, needing less assistance from PT to perform all, but still needing more assistance at L leg vs R leg. Pt continues to be limited in tolerating sitting up EOB due to pain, needing modAx2 to complete and min guard-minA to sit for up to ~20 sec before returning to supine due to severe pain. Despite pain, pt is very motivated to participate and improve. Pt falling asleep quickly during session once supine and reporting he was "on a horse", needing cues to open eyes for him to identify that he was actually in bed. Still awaiting Kreg tilt bed to assist in improving his upright and OOB tolerance. Will continue to follow acutely. Current recommendations remain appropriate at this time as long as pt's tolerance to activity improves.    Follow Up Recommendations  CIR;Supervision/Assistance - 24 hour     Equipment Recommendations  Hospital bed    Recommendations for Other Services Rehab consult     Precautions / Restrictions Precautions Precautions:  Back Precaution Booklet Issued: No Precaution Comments: provided cues to maintain during mobility Restrictions Weight Bearing Restrictions: No    Mobility  Bed Mobility Overal bed mobility: Needs Assistance Bed Mobility: Rolling;Sidelying to Sit;Sit to Supine Rolling: Min assist Sidelying to sit: +2 for physical assistance;HOB elevated;Mod assist   Sit to supine: Mod assist;HOB elevated   General bed mobility comments: Cues provided to flex R knee and reach across with R UE to roll to L, minA to complete and prevent posterior roll back. ModAx2 to initiate and manage legs off EOB and trunk ascent with pt pushing up from bed. ModA to return to supine due to pt not tolerating sitting up > 20 sec.    Transfers                 General transfer comment: unable due to pain  Ambulation/Gait             General Gait Details: Deferred due to pain.   Stairs             Wheelchair Mobility    Modified Rankin (Stroke Patients Only) Modified Rankin (Stroke Patients Only) Pre-Morbid Rankin Score: No symptoms Modified Rankin: Severe disability     Balance Overall balance assessment: Needs assistance Sitting-balance support: Bilateral upper extremity supported;Feet supported Sitting balance-Leahy Scale: Poor Sitting balance - Comments: unable to tolerate > 20 sec due to pain, min guard-minA       Standing balance comment: Deferred due to pain.                            Cognition Arousal/Alertness: Lethargic;Suspect due to medications Behavior  During Therapy: Restless Overall Cognitive Status: Impaired/Different from baseline Area of Impairment: Memory;Following commands;Safety/judgement;Attention;Orientation                 Orientation Level: Disoriented to;Situation (stated "I'm on a horse" at one point) Current Attention Level: Sustained Memory: Decreased short-term memory;Decreased recall of precautions Following Commands: Follows one  step commands with increased time Safety/Judgement: Decreased awareness of safety;Decreased awareness of deficits     General Comments: Pt falling asleep often, possibly due to meds, and needing cues to awaken. Pt stating odd comments when his eyes would close, like stating there was dog and "I'm on a horse", needing cues to open eyes and look around to correctly then identify that he was on a bed. Pt needing increased time for all cues.      Exercises General Exercises - Lower Extremity Ankle Circles/Pumps: AAROM;Both;10 reps;Supine Heel Slides: AAROM;Both;5 reps;Supine Hip ABduction/ADduction: AAROM;Both;5 reps;Supine    General Comments General comments (skin integrity, edema, etc.): SpO2 99-100% on 2.5L upon arrival, doffed [place]co with it desatting to 91% when in pain, re-donned with gradual rebound      Pertinent Vitals/Pain Pain Assessment: Faces Faces Pain Scale: Hurts worst Pain Location: L leg and back Pain Descriptors / Indicators: Discomfort;Grimacing;Guarding;Moaning Pain Intervention(s): Limited activity within patient's tolerance;Monitored during session;Repositioned;Premedicated before session    Home Living                      Prior Function            PT Goals (current goals can now be found in the care plan section) Acute Rehab PT Goals Patient Stated Goal: to not hurt PT Goal Formulation: With patient Time For Goal Achievement: 01/27/21 Potential to Achieve Goals: Fair Progress towards PT goals: Not progressing toward goals - comment (limited by pain)    Frequency    Min 5X/week      PT Plan Current plan remains appropriate    Co-evaluation              AM-PAC PT "6 Clicks" Mobility   Outcome Measure  Help needed turning from your back to your side while in a flat bed without using bedrails?: A Little Help needed moving from lying on your back to sitting on the side of a flat bed without using bedrails?: Total Help needed moving to  and from a bed to a chair (including a wheelchair)?: Total Help needed standing up from a chair using your arms (e.g., wheelchair or bedside chair)?: Total Help needed to walk in hospital room?: Total Help needed climbing 3-5 steps with a railing? : Total 6 Click Score: 8    End of Session Equipment Utilized During Treatment: Oxygen Activity Tolerance: Patient limited by pain;Patient limited by lethargy Patient left: in bed;with call bell/phone within reach;with bed alarm set Nurse Communication: Mobility status;Other (comment) (sats) PT Visit Diagnosis: Pain;Muscle weakness (generalized) (M62.81);Difficulty in walking, not elsewhere classified (R26.2) Pain - part of body: Leg     Time: 0926-0950 PT Time Calculation (min) (ACUTE ONLY): 24 min  Charges:  $Therapeutic Exercise: 8-22 mins $Therapeutic Activity: 8-22 mins                     Raymond Gurney, PT, DPT Acute Rehabilitation Services  Pager: [phone] Office: [phone]    Henrene Dodge Pettis 01/19/2021, 11:01 AM

## 2021-01-20 DIAGNOSIS — G9341 Metabolic encephalopathy: Secondary | ICD-10-CM | POA: Diagnosis not present

## 2021-01-20 DIAGNOSIS — G062 Extradural and subdural abscess, unspecified: Secondary | ICD-10-CM | POA: Diagnosis not present

## 2021-01-20 DIAGNOSIS — I5032 Chronic diastolic (congestive) heart failure: Secondary | ICD-10-CM | POA: Diagnosis not present

## 2021-01-20 DIAGNOSIS — B9562 Methicillin resistant Staphylococcus aureus infection as the cause of diseases classified elsewhere: Secondary | ICD-10-CM | POA: Diagnosis not present

## 2021-01-20 DIAGNOSIS — M4620 Osteomyelitis of vertebra, site unspecified: Secondary | ICD-10-CM | POA: Diagnosis not present

## 2021-01-20 DIAGNOSIS — R7881 Bacteremia: Secondary | ICD-10-CM | POA: Diagnosis not present

## 2021-01-20 DIAGNOSIS — E876 Hypokalemia: Secondary | ICD-10-CM

## 2021-01-20 DIAGNOSIS — A419 Sepsis, unspecified organism: Secondary | ICD-10-CM | POA: Diagnosis not present

## 2021-01-20 LAB — CBC WITH DIFFERENTIAL/PLATELET
Abs Immature Granulocytes: 0.04 10*3/uL (ref 0.00–0.07)
Basophils Absolute: 0 10*3/uL (ref 0.0–0.1)
Basophils Relative: 0 %
Eosinophils Absolute: 0.2 10*3/uL (ref 0.0–0.5)
Eosinophils Relative: 2 %
HCT: 24.4 % — ABNORMAL LOW (ref 39.0–52.0)
Hemoglobin: 8.1 g/dL — ABNORMAL LOW (ref 13.0–17.0)
Immature Granulocytes: 0 %
Lymphocytes Relative: 13 %
Lymphs Abs: 1.2 10*3/uL (ref 0.7–4.0)
MCH: 29 pg (ref 26.0–34.0)
MCHC: 33.2 g/dL (ref 30.0–36.0)
MCV: 87.5 fL (ref 80.0–100.0)
Monocytes Absolute: 0.8 10*3/uL (ref 0.1–1.0)
Monocytes Relative: 8 %
Neutro Abs: 7.2 10*3/uL (ref 1.7–7.7)
Neutrophils Relative %: 77 %
Platelets: 532 10*3/uL — ABNORMAL HIGH (ref 150–400)
RBC: 2.79 MIL/uL — ABNORMAL LOW (ref 4.22–5.81)
RDW: 15.6 % — ABNORMAL HIGH (ref 11.5–15.5)
WBC: 9.5 10*3/uL (ref 4.0–10.5)
nRBC: 0 % (ref 0.0–0.2)

## 2021-01-20 LAB — MAGNESIUM: Magnesium: 1.9 mg/dL (ref 1.7–2.4)

## 2021-01-20 LAB — COMPREHENSIVE METABOLIC PANEL
ALT: 9 U/L (ref 0–44)
AST: 11 U/L — ABNORMAL LOW (ref 15–41)
Albumin: 2.5 g/dL — ABNORMAL LOW (ref 3.5–5.0)
Alkaline Phosphatase: 66 U/L (ref 38–126)
Anion gap: 8 (ref 5–15)
BUN: 6 mg/dL (ref 6–20)
CO2: 33 mmol/L — ABNORMAL HIGH (ref 22–32)
Calcium: 8 mg/dL — ABNORMAL LOW (ref 8.9–10.3)
Chloride: 90 mmol/L — ABNORMAL LOW (ref 98–111)
Creatinine, Ser: 0.52 mg/dL — ABNORMAL LOW (ref 0.61–1.24)
GFR, Estimated: >60 mL/min (ref >60)
Glucose, Bld: 271 mg/dL — ABNORMAL HIGH (ref 70–99)
Potassium: 3.8 mmol/L (ref 3.5–5.1)
Sodium: 131 mmol/L — ABNORMAL LOW (ref 135–145)
Total Bilirubin: 0.3 mg/dL (ref 0.3–1.2)
Total Protein: 5.9 g/dL — ABNORMAL LOW (ref 6.5–8.1)

## 2021-01-20 LAB — GLUCOSE, CAPILLARY
Glucose-Capillary: 306 mg/dL — ABNORMAL HIGH (ref 70–99)
Glucose-Capillary: 190 mg/dL — ABNORMAL HIGH (ref 70–99)
Glucose-Capillary: 273 mg/dL — ABNORMAL HIGH (ref 70–99)
Glucose-Capillary: 223 mg/dL — ABNORMAL HIGH (ref 70–99)
Glucose-Capillary: 267 mg/dL — ABNORMAL HIGH (ref 70–99)
Glucose-Capillary: 228 mg/dL — ABNORMAL HIGH (ref 70–99)

## 2021-01-20 LAB — PHOSPHORUS: Phosphorus: 2.6 mg/dL (ref 2.5–4.6)

## 2021-01-20 MED ORDER — PSYLLIUM 95 % PO PACK
1.0000 | PACK | Freq: Every day | ORAL | Status: DC
  Administered 2021-01-20 – 2021-01-28 (×8): 1 via ORAL
  Filled 2021-01-20 (×9): qty 1

## 2021-01-20 MED ORDER — SODIUM CHLORIDE 0.9 % IV SOLN: INTRAVENOUS | Status: AC

## 2021-01-20 MED ORDER — GLUCERNA 1.5 CAL PO LIQD
1000.0000 mL | ORAL | Status: DC
  Administered 2021-01-20: 1000 mL
  Filled 2021-01-20 (×2): qty 1000

## 2021-01-20 MED ORDER — AMLODIPINE BESYLATE 5 MG PO TABS
5.0000 mg | ORAL_TABLET | Freq: Every day | ORAL | Status: DC
  Administered 2021-01-20 – 2021-01-28 (×9): 5 mg via ORAL
  Filled 2021-01-20 (×9): qty 1

## 2021-01-20 MED ORDER — INSULIN GLARGINE 100 UNIT/ML ~~LOC~~ SOLN
8.0000 [IU] | Freq: Two times a day (BID) | SUBCUTANEOUS | Status: DC
  Administered 2021-01-20 (×2): 8 [IU] via SUBCUTANEOUS
  Filled 2021-01-20 (×4): qty 0.08

## 2021-01-20 NOTE — Progress Notes (Signed)
Pt has 3+ pitting edema in left foot, no redness or warmth to area present.  OT reported that pt c/o pain behind left knee during therapy. Dr. Radonna Ricker notified via text page.

## 2021-01-20 NOTE — Progress Notes (Signed)
Nutrition Follow-up  DOCUMENTATION CODES:   Severe malnutrition in context of acute illness/injury  INTERVENTION:   - Please obtain updated weight  Continue tube feeds via post-pyloric Cortrak: - Change formula to Glucerna 1.5 @ 65 ml/hr (1560 ml/day)  Tube feeding regimen provides 2340 kcal, 129 grams of protein, and 1184 ml of H2O.   - Will monitor PO intake and adjust TF regimen as appropriate  - Continue MVI with minerals daily  - Continue Ensure Enlive po TID, each supplement provides 350 kcal and 20 grams of protein  - Encourage PO intake  NUTRITION DIAGNOSIS:   Severe Malnutrition related to acute illness (MRSA bacteremia) as evidenced by mild fat depletion, moderate muscle depletion, energy intake < or equal to 50% for > or equal to 5 days.  Ongoing, being addressed via TF and oral nutrition supplements  GOAL:   Patient will meet greater than or equal to 90% of their needs  Met via TF  MONITOR:   PO intake, Supplement acceptance, Labs, Weight trends, TF tolerance, Skin, I & O's  REASON FOR ASSESSMENT:   Consult Assessment of nutrition requirement/status  ASSESSMENT:   55 year old male who presented to the ED on 7/13 with increased back pain after thoracic and lumbar laminectomies on 12/21/20. Pt with recent admission for epidural abscess, vertebral osteomyelitis, psoas abscess, and MRSA bacteremia. PMH of CAD, T2DM, HTN, HLD, CAD s/p CABG in 2017, anxiety.  7/16 - s/p US aspiration of lumbar paraspinal collection 7/19 - s/p TEE without vegetation 7/22 - pt with N/V 7/25 - Cortrak placed (tip post-pyloric), TF initiated  Discussed pt with RN who repots pt with very minimal PO intake over last few days. Spoke with pt and wife at bedside. Wife reports pt had visitors yesterday who were able to encourage him to eat ~50% of dinner meal tray. RD noted untouched breakfast meal tray at bedside. Pt's wife states that pt did not drink any Ensure yesterday but pt  reports he consumed 1 Ensure yesterday.  RD provided pt with an Ensure Enlive at time of visit but pt did not take a sip with RD in room. Pt focused on leg pain. RN aware.  Pt with increased CBG's related to tube feeding. Now that pt is tolerating Osmolite 1.5 at goal, will transition to lower carbohydrate tube feeding formula. RN made aware.  Once PO intake improves, will consider transitioning pt to nocturnal tube feeds. PO intake is still very minimal. Pt denies any nausea at this time but wife reports pt was complaining of nausea earlier today  Admit weight: 92.9 kg Current weight: 94.3 kg on 7/21  No new weights since 7/21. Pt with pitting edema to BLE. Suspect pt is volume overloaded. Difficult to determine dry weight at this time. Please obtain updated weight.  Meal Completion: 0-5% x last 6 documented meals  Medications reviewed and include: Ensure Enlive TID, SSI q 4 hours, lantus 8 units BID, MVI with minerals, IV protonix, psyllium, IV abx IVF: NS @ 75 ml/hr  Labs reviewed: sodium 131, hemoglobin 8.1 CBG's: 190-320 x 24 hours  Diet Order:   Diet Order             Diet regular Room service appropriate? Yes; Fluid consistency: Thin  Diet effective now                   EDUCATION NEEDS:   Education needs have been addressed  Skin:  Skin Assessment: Skin Integrity Issues: Incisions: back  Last BM:  01/20/21 large type 7  Height:   Ht Readings from Last 1 Encounters:  12/21/20 $RemoveB'5\' 11"'[id number]$  (1.803 m)    Weight:   Wt Readings from Last 1 Encounters:  01/13/21 94.3 kg    BMI:  Body mass index is 29.01 kg/m.  Estimated Nutritional Needs:   Kcal:  2300-2500  Protein:  115-130 grams  Fluid:  >/= 2.0 L    Gustavus Bryant, MS, RD, LDN Inpatient Clinical Dietitian Please see [facility] for contact information.

## 2021-01-20 NOTE — Progress Notes (Signed)
[facility] PROGRESS NOTE    Progress Note  Louis [name]  VZS:[id number] DOB: 09-15-1965 DOA: 01/05/2021 PCP: Bobette Mo, NP     Brief Narrative:   Louis [name] is an 55 y.o. 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 01/11/2021 TEE negative for vegetation. 01/12/2021 CT scan of the abdomen pelvis showed progressive anasarca progressive erosion of L3-L4 with increasing paraspinal inflammatory collections adjacent to L3-L5 without drainable fluid at this time. 01/14/2021 blood cultures negative till date.   Assessment/Plan:   Sepsis and secondary to discitis and epidural abscess/discitis: Sepsis resolved on 01/13/2021.  Was on IV daptomycin Status postlaminectomy and evacuation of abscess on 12/21/2020. MRI of the lumbar spine showed multiple fluid collections and L4-L5 Daptomycin MIC was not susceptible on lab from 01/06/2019 to my explain why he is not clearing the infection. Negative TEE and TEE. Persistent positive cultures ID recommended to continue on dual treatment IV vancomycin and ceftaroline no plan for surgical intervention at this time. The plan is to repeat MRI of the thoracic lumbar spine in 2 weeks.  Persistent MRSA bacteremia: Surveillance blood cultures on 01/14/2021 have been negative  till date Repeated blood cultures on 01/16/2021 and 01/18/2021 have remained negative till date. ID recommended to continue IV vancomycin and sertraline, tentatively plan for 14 days of double coverage from negative culture.  Acute metabolic encephalopathy: Became encephalopathic, likely multifactorial in the setting of opioids, metabolic and infectious etiology.  Narcotic were adjusted on 01/19/2021.  Continue to slowly taper them down. MRI of the brain showed no acute findings.  Severe protein caloric malnutrition/anasarca: Prealbumin of 5.9. Oral intake remains poor. 01/17/2021 core track placed started him on enteral nutrition.  Uncontrolled diabetes mellitus type 2: With an A1c of 9.1, currently on long-acting insulin plus sliding scale. We will go ahead and increase his long-acting insulin.  Mechanical fall: Patient had a fall on 12/31/2020. CBD CT scan of the head showed no acute findings.  Continue fall precautions.  Cholestasis: Reactive due to infectious trends complete metabolic panel.  GERD: Continue Protonix.  Oral thrush: Completed 7-day course of Diflucan.  Hypokalemia: Replete orally now resolved.  Hypomagnesemia: Repleted now resolved.  Thrombocytosis: Asked acting as an acute phase reactant.  Abnormal EKG: Had ST segment elevation V1 V2 without reciprocal changes cardiology was consulted and a 2D echo showed no wall motion abnormalities cardiac biomarkers remain remained flat.  Chronic diastolic heart failure: Appears anasarca on physical exam likely due to low albumin. His nutrition has been started on tube feedings. Slowly improving.  Hypovolemic hyponatremia: He is third spacing, his albumin is slowly started to trend down and he is currently on nutrition feeds.   DVT prophylaxis: lovenox Family Communication:none Status is: Inpatient  Remains inpatient appropriate because:Hemodynamically unstable  Dispo: The patient is from: Home               Anticipated d/c  is to: CIR              Patient currently is not medically stable to d/c.   Difficult to place patient No    Code Status:     Code Status Orders  (From admission, onward)           Start     Ordered   01/06/21 0224  Full code  Continuous        01/06/21 0226           Code Status History     Date Active Date Inactive Code Status Order ID Comments User Context   12/12/2020 2253 12/26/2020 2008 Full Code [id number]  Synetta Fail, MD ED         IV Access:   Peripheral IV   Procedures and diagnostic studies:   No results found.   Medical Consultants:   None.   Subjective:    Louis [name] more awake today relate he is not hungry but is having some back pain when he moves.  Objective:    Vitals:   01/19/21 0824 01/19/21 1157 01/19/21 1515 01/20/21 0345  BP: (!) 152/87 (!) 143/88 (!) 147/88 (!) 159/90  Pulse: 98 98 100 (!) 101  Resp: Temp: 98.7 F (37.1 C) 98 F (36.7 C) 98 F (36.7 C) 98.4 F (36.9 C)  TempSrc: Axillary Axillary Oral Oral  SpO2: 99% 96% 98%   Weight:       SpO2: 98 % O2 Flow Rate (L/min): 1 L/min   Intake/Output Summary (Last 24 hours) at 01/20/2021 0726 Last data filed at 01/19/2021 2208 Gross per 24 hour  Intake 200 ml  Output 2 ml  Net 198 ml   Filed Weights   01/09/21 1700 01/13/21 1436  Weight: 92.9 kg 94.3 kg    Exam: General exam: In no acute distress. Respiratory system: Good air movement and clear to auscultation. Cardiovascular system: S1 & S2 heard, RRR. No JVD. Gastrointestinal system: Abdomen is nondistended, soft and nontender.  Extremities: No pedal edema. Skin: No rashes, lesions or ulcers Psychiatry: Judgement and insight appear normal.    Data Reviewed:    Labs: Basic Metabolic Panel: Recent Labs  Lab 01/16/21 0643 01/17/21 0220 01/17/21 1859 01/18/21 0530 01/19/21 0347 01/20/21 0221  NA 136 132*  --  134* 133* 131*  K 3.4* 2.9* 3.0* 2.9* 3.7 3.8  CL  97* 89*  --  92* 93* 90*  CO2 32 33*  --  34* 33* 33*  GLUCOSE 191* 199*  --  212* 261* 271*  BUN <5* <5*  --  CREATININE 0.44* 0.46*  --  0.53* 0.42* 0.52*  CALCIUM 8.8* 8.3*  --  8.2* 8.0* 8.0*  MG 1.9 1.5* 2.1 1.9 1.9 1.9  PHOS 2.2* 2.9 2.9 2.8 2.7 2.6   GFR Estimated Creatinine Clearance: 119.5 mL/min (A) (by C-G formula based on SCr of 0.52 mg/dL (L)). Liver Function Tests: Recent Labs  Lab 01/16/21 0643 01/17/21 0220 01/18/21 0530 01/19/21 0347 01/20/21 0221  AST 10* 13* 13* 11* 11*  ALT ALKPHOS 67 66 64 67 66  BILITOT 0.7 1.2 0.8 0.7 0.3  PROT 6.2* 6.0* 5.9* 5.5* 5.9*  ALBUMIN 3.2* 3.2* 2.7* 2.5* 2.5*   No results for input(s): LIPASE, AMYLASE in the last 168 hours. No results for input(s): AMMONIA in the last 168 hours. Coagulation profile No results for input(s): INR, PROTIME  in the last 168 hours. COVID-19 Labs  No results for input(s): DDIMER, FERRITIN, LDH, CRP in the last 72 hours.  Lab Results  Component Value Date   SARSCOV2NAA NEGATIVE 01/05/2021   SARSCOV2NAA NEGATIVE 12/12/2020    CBC: Recent Labs  Lab 01/16/21 0643 01/17/21 0220 01/18/21 0530 01/19/21 0347 01/20/21 0221  WBC 13.8* 11.9* 12.8* 11.4* 9.5  NEUTROABS 11.0* 8.9* 10.7* 8.3* 7.2  HGB 8.3* 7.8* 7.8* 8.0* 8.1*  HCT 25.7* 24.2* 23.6* 24.3* 24.4*  MCV 88.6 87.7 87.1 87.4 87.5  PLT 564* 586* 573* 517* 532*   Cardiac Enzymes: No results for input(s): CKTOTAL, CKMB, CKMBINDEX, TROPONINI in the last 168 hours. BNP (last 3 results) No results for input(s): PROBNP in the last 8760 hours. CBG: Recent Labs  Lab 01/19/21 1507 01/19/21 1514 01/19/21 1954 01/19/21 2345 01/20/21 0342  GLUCAP 305* 320* 262* 276* 306*   D-Dimer: No results for input(s): DDIMER in the last 72 hours. Hgb A1c: No results for input(s): HGBA1C in the last 72 hours. Lipid Profile: No results for input(s): CHOL, HDL, LDLCALC, TRIG, CHOLHDL, LDLDIRECT in the last 72 hours. Thyroid  function studies: No results for input(s): TSH, T4TOTAL, T3FREE, THYROIDAB in the last 72 hours.  Invalid input(s): FREET3 Anemia work up: No results for input(s): VITAMINB12, FOLATE, FERRITIN, TIBC, IRON, RETICCTPCT in the last 72 hours. Sepsis Labs: Recent Labs  Lab 01/17/21 0220 01/18/21 0530 01/19/21 0347 01/20/21 0221  WBC 11.9* 12.8* 11.4* 9.5   Microbiology Recent Results (from the past 240 hour(s))  Culture, blood (routine x 2)     Status: Abnormal   Collection Time: 01/11/21  2:02 PM   Specimen: BLOOD RIGHT HAND  Result Value Ref Range Status   Specimen Description BLOOD RIGHT HAND  Final   Special Requests   Final    BOTTLES DRAWN AEROBIC AND ANAEROBIC Blood Culture adequate volume   Culture  Setup Time   Final    GRAM POSITIVE COCCI IN CLUSTERS IN BOTH AEROBIC AND ANAEROBIC BOTTLES CRITICAL RESULT CALLED TO, READ BACK BY AND VERIFIED WITH: J. LEDFORD PHARMD, AT 0630 01/12/21 D. VANHOOK    Culture (A)  Final    STAPHYLOCOCCUS AUREUS SUSCEPTIBILITIES PERFORMED ON PREVIOUS CULTURE WITHIN THE LAST 5 DAYS. Performed at [facility], [address], [place], [place] [address]    Report Status 01/14/2021 FINAL  Final  Culture, blood (routine x 2)     Status: Abnormal   Collection Time: 01/11/21  2:03 PM   Specimen: BLOOD LEFT HAND  Result Value Ref Range Status   Specimen Description BLOOD LEFT HAND  Final   Special Requests   Final    BOTTLES DRAWN AEROBIC AND ANAEROBIC Blood Culture adequate volume   Culture  Setup Time   Final    GRAM POSITIVE COCCI IN CLUSTERS ANAEROBIC BOTTLE ONLY CRITICAL VALUE NOTED.  VALUE IS CONSISTENT WITH PREVIOUSLY REPORTED AND CALLED VALUE. Performed at [facility], [address], [place], [place] [address]    Culture METHICILLIN RESISTANT STAPHYLOCOCCUS AUREUS (A)  Final   Report Status 01/14/2021 FINAL  Final   Organism ID, Bacteria METHICILLIN RESISTANT STAPHYLOCOCCUS AUREUS  Final      Susceptibility   Methicillin  resistant staphylococcus aureus - MIC*    CIPROFLOXACIN >=8 RESISTANT Resistant     ERYTHROMYCIN >=8 RESISTANT Resistant     GENTAMICIN <=0.5 SENSITIVE Sensitive     OXACILLIN >=4 RESISTANT Resistant     TETRACYCLINE <=1 SENSITIVE Sensitive  VANCOMYCIN <=0.5 SENSITIVE Sensitive     TRIMETH/SULFA >=320 RESISTANT Resistant     CLINDAMYCIN <=0.25 SENSITIVE Sensitive     RIFAMPIN <=0.5 SENSITIVE Sensitive     Inducible Clindamycin NEGATIVE Sensitive     * METHICILLIN RESISTANT STAPHYLOCOCCUS AUREUS  Culture, blood (routine x 2)     Status: Abnormal   Collection Time: 01/12/21 11:47 AM   Specimen: BLOOD RIGHT HAND  Result Value Ref Range Status   Specimen Description BLOOD RIGHT HAND  Final   Special Requests   Final    BOTTLES DRAWN AEROBIC AND ANAEROBIC Blood Culture results may not be optimal due to an inadequate volume of blood received in culture bottles   Culture  Setup Time   Final    GRAM POSITIVE COCCI IN CLUSTERS IN BOTH AEROBIC AND ANAEROBIC BOTTLES CRITICAL VALUE NOTED.  VALUE IS CONSISTENT WITH PREVIOUSLY REPORTED AND CALLED VALUE.    Culture (A)  Final    STAPHYLOCOCCUS AUREUS SUSCEPTIBILITIES PERFORMED ON PREVIOUS CULTURE WITHIN THE LAST 5 DAYS. Performed at [facility], [address] [place], [place] [address]    Report Status 01/15/2021 FINAL  Final  Culture, blood (routine x 2)     Status: None   Collection Time: 01/12/21 11:52 AM   Specimen: BLOOD LEFT HAND  Result Value Ref Range Status   Specimen Description BLOOD LEFT HAND  Final   Special Requests   Final    BOTTLES DRAWN AEROBIC ONLY Blood Culture adequate volume   Culture   Final    NO GROWTH 5 DAYS Performed at [facility], [address] [place], [place] [address]    Report Status 01/17/2021 FINAL  Final  Culture, blood (routine x 2)     Status: None   Collection Time: 01/14/21  5:42 AM   Specimen: BLOOD RIGHT HAND  Result Value Ref Range Status   Specimen Description BLOOD RIGHT  HAND  Final   Special Requests   Final    BOTTLES DRAWN AEROBIC AND ANAEROBIC Blood Culture adequate volume   Culture   Final    NO GROWTH 5 DAYS Performed at [facility], [address] [place], [place] [address]    Report Status 01/19/2021 FINAL  Final  Culture, blood (routine x 2)     Status: None   Collection Time: 01/14/21  5:42 AM   Specimen: BLOOD LEFT HAND  Result Value Ref Range Status   Specimen Description BLOOD LEFT HAND  Final   Special Requests   Final    BOTTLES DRAWN AEROBIC AND ANAEROBIC Blood Culture adequate volume   Culture   Final    NO GROWTH 5 DAYS Performed at [facility], [address] [place], [place] [address]    Report Status 01/19/2021 FINAL  Final  Culture, blood (routine x 2)     Status: None (Preliminary result)   Collection Time: 01/16/21  6:43 AM   Specimen: BLOOD RIGHT HAND  Result Value Ref Range Status   Specimen Description BLOOD RIGHT HAND  Final   Special Requests   Final    BOTTLES DRAWN AEROBIC ONLY Blood Culture adequate volume   Culture   Final    NO GROWTH 3 DAYS Performed at [facility], [address] [place], [place] [address]    Report Status PENDING  Incomplete  Culture, blood (routine x 2)     Status: None (Preliminary result)   Collection Time: 01/16/21  6:44 AM  Specimen: BLOOD LEFT HAND  Result Value Ref Range Status   Specimen Description BLOOD LEFT HAND  Final   Special Requests   Final    BOTTLES DRAWN AEROBIC AND ANAEROBIC Blood Culture adequate volume   Culture   Final    NO GROWTH 3 DAYS Performed at [facility], [address] [place], [place] [address]    Report Status PENDING  Incomplete  Culture, blood (routine x 2)     Status: None (Preliminary result)   Collection Time: 01/18/21  5:30 AM   Specimen: BLOOD RIGHT HAND  Result Value Ref Range Status   Specimen Description BLOOD RIGHT HAND  Final   Special Requests   Final    BOTTLES DRAWN AEROBIC ONLY Blood Culture  adequate volume   Culture   Final    NO GROWTH 1 DAY Performed at [facility], [address], [place], [place] [address]    Report Status PENDING  Incomplete  Culture, blood (routine x 2)     Status: None (Preliminary result)   Collection Time: 01/18/21  5:34 AM   Specimen: BLOOD LEFT HAND  Result Value Ref Range Status   Specimen Description BLOOD LEFT HAND  Final   Special Requests   Final    BOTTLES DRAWN AEROBIC ONLY Blood Culture adequate volume   Culture   Final    NO GROWTH 1 DAY Performed at [facility], [address], [place], [place] [address]    Report Status PENDING  Incomplete     Medications:    aspirin EC  81 mg Oral Daily   atorvastatin  40 mg Oral QHS   enoxaparin (LOVENOX) injection  40 mg Subcutaneous QHS   feeding supplement  237 mL Oral TID BM   feeding supplement (PROSource TF)  45 mL Per Tube BID   gabapentin  200 mg Oral TID   insulin aspart  0-9 Units Subcutaneous Q4H   insulin glargine  8 Units Subcutaneous Daily   lactulose  10 g Per Tube BID   losartan  25 mg Oral q morning   mouth rinse  15 mL Mouth Rinse BID   metoprolol succinate  25 mg Oral Daily   multivitamin with minerals  1 tablet Oral Daily   oxyCODONE  10 mg Oral Q12H   pantoprazole (PROTONIX) IV  40 mg Intravenous BID   polyethylene glycol  17 g Per NG tube BID   sennosides  10 mL Per Tube BID   venlafaxine XR  75 mg Oral q morning   Continuous Infusions:  ceFTAROline (TEFLARO) IV 600 mg (01/19/21 2344)   feeding supplement (OSMOLITE 1.5 CAL) 1,000 mL (01/18/21 2125)   promethazine (PHENERGAN) injection (IM or IVPB) Stopped (01/15/21 1155)   vancomycin 1,750 mg (01/20/21 0441)      LOS: 14 days   Marinda Elk  [facility]  01/20/2021, 7:26 AM

## 2021-01-20 NOTE — Progress Notes (Signed)
Occupational Therapy Treatment Patient Details Name: Louis [name] MRN: [id number] DOB: [birth year] Today's Date: 01/20/2021    History of present illness Pt is a 55 y.o. male who presented 7/13 with worsening back pain with recurrent MRSA bacteremia after discharging 7/3 from being admitted with epidural abscess, vertebral osteomyelitis L3-S1, psoas abscess, and MRSA bacteremia with s/p 12/21/20 T7-8, T12-L1, L3-4 laminectomies for evacuation of epidural abscess. 7/13 mri lumbar/thoracic showed several areas of fluid collection with a more loculated focus at L4-5. 7/20 imaging revealed progressive erosive changes involving the L3 and L4 vertebral bodies  in keeping with progressive changes of discitis osteomyelitis. PMH: CAD, hypertension, uncontrolled diabetes mellitus.   OT comments  Patient with incremental progress toward patient focused OT goals.  Continues to be limited by pain, but continues to try hard.  Up to [facility] for rolling, Mod A for supine to sit, Min A for sit to supine, and only tolerated < 10 sec sitting EOB.  Barrier is pain and lethargy.  It is hoped if pain can be managed, patient may be able to progress withy ADL status.  CIR has been recommended.  OT will continue to follow in the acute setting.    Follow Up Recommendations  CIR    Equipment Recommendations  None recommended by OT    Recommendations for Other Services      Precautions / Restrictions Precautions Precautions: Back Restrictions Weight Bearing Restrictions: No       Mobility Bed Mobility Overal bed mobility: Needs Assistance Bed Mobility: Rolling;Sidelying to Sit;Sit to Sidelying Rolling: Min guard Sidelying to sit: Mod assist;HOB elevated     Sit to sidelying: Min assist;HOB elevated General bed mobility comments: asist to elevate trunk for sit, and to assist with L leg during supine.  patient not safe with lying down due to pain Patient Response: Anxious  Transfers     Transfers:  Lateral/Scoot Transfers          Lateral/Scoot Transfers: Min guard General transfer comment: able to lateral scoot times two to [facility]    Balance Overall balance assessment: Needs assistance Sitting-balance support: Bilateral upper extremity supported;Feet supported Sitting balance-Leahy Scale: Poor                                     ADL either performed or assessed with clinical judgement   ADL       Grooming: Wash/dry hands;Wash/dry face;Set up;Bed level                                                         Cognition Arousal/Alertness: Lethargic Behavior During Therapy: Restless Overall Cognitive Status: Impaired/Different from baseline                     Current Attention Level: Sustained   Following Commands: Follows one step commands inconsistently                Exercises Other Exercises Other Exercises: chest press: supine, 10 reps AROM Other Exercises: lateral raise : supine, 10 reps AROM   Shoulder Instructions       General Comments      Pertinent Vitals/ Pain       Pain Assessment: Faces Faces Pain Scale: Hurts worst  Pain Location: L leg and back Pain Descriptors / Indicators: Discomfort;Grimacing;Guarding;Moaning Pain Intervention(s): Monitored during session                                                          Frequency  Min 2X/week        Progress Toward Goals  OT Goals(current goals can now be found in the care plan section)  Progress towards OT goals: OT to reassess next treatment  Acute Rehab OT Goals Patient Stated Goal: none mentioned OT Goal Formulation: With patient Time For Goal Achievement: 01/27/21 Potential to Achieve Goals: Good  Plan Discharge plan remains appropriate    Co-evaluation                 AM-PAC OT "6 Clicks" Daily Activity     Outcome Measure   Help from another person eating meals?: A Little Help from another  person taking care of personal grooming?: A Little Help from another person toileting, which includes using toliet, bedpan, or urinal?: A Lot Help from another person bathing (including washing, rinsing, drying)?: A Lot Help from another person to put on and taking off regular upper body clothing?: A Lot Help from another person to put on and taking off regular lower body clothing?: Total 6 Click Score: 13    End of Session    OT Visit Diagnosis: Pain Pain - part of body: Leg   Activity Tolerance Patient limited by pain   Patient Left in bed;with call bell/phone within reach;with family/visitor present;with bed alarm set   Nurse Communication Other (comment) (Baker's cyst)        Time: 1010-1040 OT Time Calculation (min): 30 min  Charges: OT General Charges $OT Visit: 1 Visit OT Treatments $Therapeutic Activity: 8-22 mins  01/20/2021  Rich, OTR/L  Acute Rehabilitation Services  Office:  [phone]    Suzanna Obey 01/20/2021, 10:48 AM

## 2021-01-20 NOTE — Progress Notes (Signed)
[facility]  Date of Admission:  01/05/2021     CC: Mrsa bacteremia  Lines: Peripheral iv's  Abx: 7/20-c ceftaroline 7/14-20; 7/21-c vanc  7/20 dapto  Previously daptomycin  ASSESSMENT: 56 yo male recent mrsa bacteremia/epidural abscess/OM and psoas abscess s/p open debridement 6/28 and IR drainage of left psoas discharged on daptomycin, now readmitted 7/13 for worsening back pain of a few days found to have recurrent mrsa bacteremia  7/13 & 7/15 & 7/17 & 7/19 & 7/20 bcx mrsa   7/13 mri lumbar/thoracic showed several areas of fluid collection with a more loculated focus at L4-5. There is no focal LE neurologic deficit or urinary/bowel incontinence. NSG reviewed case and felt no urgent decompression at this time  7/16 IR reviewed case and percutaneously drained the 5 cm L3 level subdural fluid collection.  7/17 tte no vegetation 7/19 tee no vegetation 7/20 abd/pelv ct with contrast no occult abscess  Wbc improving on vanc, but persistent bacteremia switched to combination abx therapy 7/20 dapto/ceftaroline --> vanc/ceftaroline on 7/21 (dapto resistant on sensitivity testing 7/13)  Discussed with dr Dawley of [facility] on 7/21. Based on ct 7/20 and repeat mri spine no actual target to debride  --------------- 7/28 assessment Today is first day he said his pain had persistently improved, and also wbc normalized 7/22, 24, and 26 bcx remains negative  Tolerating vanc/ceftaroline     PLAN: Continue combination vanc/ceftaroline  Plan duration combination abx until 8/05, then transition back to vancomycin alone Would keep patient in house at least till then Repeat thoracolumbar mri inhouse around 8/05 Discussed with primary team  I spent more than 35 minute reviewing data/chart, and coordinating care and >50% direct face to face time providing counseling/discussing diagnostics/treatment plan with patient    Active Problems:   CAD  (coronary artery disease)   HTN (hypertension)   DMII (diabetes mellitus, type 2) (HCC)   Vertebral osteomyelitis (HCC)   MRSA bacteremia   Epidural abscess   Normocytic anemia   Hyponatremia   Anxiety   Chronic diastolic CHF (congestive heart failure) (HCC)   Protein-calorie malnutrition, severe   Discitis   Abnormal EKG   Thrombocytosis   Hypomagnesemia   Hypokalemia   Oral thrush   GERD (gastroesophageal reflux disease)   Cholestasis   No Known Allergies  Scheduled Meds:  amLODipine  5 mg Oral Daily   aspirin EC  81 mg Oral Daily   atorvastatin  40 mg Oral QHS   enoxaparin (LOVENOX) injection  40 mg Subcutaneous QHS   feeding supplement  237 mL Oral TID BM   insulin aspart  0-9 Units Subcutaneous Q4H   insulin glargine  8 Units Subcutaneous BID   losartan  25 mg Oral q morning   mouth rinse  15 mL Mouth Rinse BID   metoprolol succinate  25 mg Oral Daily   multivitamin with minerals  1 tablet Oral Daily   oxyCODONE  10 mg Oral Q12H   pantoprazole (PROTONIX) IV  40 mg Intravenous BID   psyllium  1 packet Oral Daily   venlafaxine XR  75 mg Oral q morning   Continuous Infusions:  sodium chloride 75 mL/hr at 01/20/21 0830   ceFTAROline (TEFLARO) IV 600 mg (01/20/21 0832)   feeding supplement (GLUCERNA 1.5 CAL) 1,000 mL (01/20/21 1215)   vancomycin 1,750 mg (01/20/21 0441)   PRN Meds:.acetaminophen **OR** [DISCONTINUED] acetaminophen, diclofenac Sodium, labetalol, ondansetron **OR** ondansetron (ZOFRAN) IV, oxyCODONE  SUBJECTIVE: Afebrile No n/v/d Happy today as back pain continues to improve Repeat bcx negative No rash No LE weakness No incontinence  Review of Systems: ROS All other ROS was negative, except mentioned above     OBJECTIVE: Vitals:   01/20/21 0345 01/20/21 0740 01/20/21 1140 01/20/21 1504  BP: (!) 159/90 (!) 163/93 136/87 105/73  Pulse: (!) 101 99  98  Resp:  16 15 16   Temp: 98.4 F (36.9 C) 97.9 F (36.6 C) 98.2 F (36.8 C) 98 F  (36.7 C)  TempSrc: Oral Oral Oral Oral  SpO2:  90%  96%  Weight:       Body mass index is 29.01 kg/m.  Physical Exam General/constitutional: no distress, pleasant HEENT: Normocephalic, PER, Conj Clear, EOMI, Oropharynx clear Neck supple CV: rrr no mrg Lungs: clear to auscultation, normal respiratory effort Abd: Soft, Nontender Ext: no edema Skin: No Rash Neuro: nonfocal MSK: no peripheral joint swelling/tenderness/warmth; back spines nontender   Central line presence: no   Lab Results Lab Results  Component Value Date   WBC 9.5 01/20/2021   HGB 8.1 (L) 01/20/2021   HCT 24.4 (L) 01/20/2021   MCV 87.5 01/20/2021   PLT 532 (H) 01/20/2021    Lab Results  Component Value Date   CREATININE 0.52 (L) 01/20/2021   BUN 6 01/20/2021   NA 131 (L) 01/20/2021   K 3.8 01/20/2021   CL 90 (L) 01/20/2021   CO2 33 (H) 01/20/2021    Lab Results  Component Value Date   ALT 9 01/20/2021   AST 11 (L) 01/20/2021   ALKPHOS 66 01/20/2021   BILITOT 0.3 01/20/2021      Microbiology: Recent Results (from the past 240 hour(s))  Culture, blood (routine x 2)     Status: Abnormal   Collection Time: 01/11/21  2:02 PM   Specimen: BLOOD RIGHT HAND  Result Value Ref Range Status   Specimen Description BLOOD RIGHT HAND  Final   Special Requests   Final    BOTTLES DRAWN AEROBIC AND ANAEROBIC Blood Culture adequate volume   Culture  Setup Time   Final    GRAM POSITIVE COCCI IN CLUSTERS IN BOTH AEROBIC AND ANAEROBIC BOTTLES CRITICAL RESULT CALLED TO, READ BACK BY AND VERIFIED WITH: J. LEDFORD PHARMD, AT 0630 01/12/21 D. VANHOOK    Culture (A)  Final    STAPHYLOCOCCUS AUREUS SUSCEPTIBILITIES PERFORMED ON PREVIOUS CULTURE WITHIN THE LAST 5 DAYS. Performed at [facility], [address] [place], [place] [address]    Report Status 01/14/2021 FINAL  Final  Culture, blood (routine x 2)     Status: Abnormal   Collection Time: 01/11/21  2:03 PM   Specimen: BLOOD LEFT HAND  Result  Value Ref Range Status   Specimen Description BLOOD LEFT HAND  Final   Special Requests   Final    BOTTLES DRAWN AEROBIC AND ANAEROBIC Blood Culture adequate volume   Culture  Setup Time   Final    GRAM POSITIVE COCCI IN CLUSTERS ANAEROBIC BOTTLE ONLY CRITICAL VALUE NOTED.  VALUE IS CONSISTENT WITH PREVIOUSLY REPORTED AND CALLED VALUE. Performed at [facility], [address] [place], [place] [address]    Culture METHICILLIN RESISTANT STAPHYLOCOCCUS AUREUS (A)  Final   Report Status 01/14/2021 FINAL  Final   Organism ID, Bacteria METHICILLIN RESISTANT STAPHYLOCOCCUS AUREUS  Final      Susceptibility   Methicillin resistant staphylococcus aureus - MIC*    CIPROFLOXACIN >=8 RESISTANT Resistant  ERYTHROMYCIN >=8 RESISTANT Resistant     GENTAMICIN <=0.5 SENSITIVE Sensitive     OXACILLIN >=4 RESISTANT Resistant     TETRACYCLINE <=1 SENSITIVE Sensitive     VANCOMYCIN <=0.5 SENSITIVE Sensitive     TRIMETH/SULFA >=320 RESISTANT Resistant     CLINDAMYCIN <=0.25 SENSITIVE Sensitive     RIFAMPIN <=0.5 SENSITIVE Sensitive     Inducible Clindamycin NEGATIVE Sensitive     * METHICILLIN RESISTANT STAPHYLOCOCCUS AUREUS  Culture, blood (routine x 2)     Status: Abnormal   Collection Time: 01/12/21 11:47 AM   Specimen: BLOOD RIGHT HAND  Result Value Ref Range Status   Specimen Description BLOOD RIGHT HAND  Final   Special Requests   Final    BOTTLES DRAWN AEROBIC AND ANAEROBIC Blood Culture results may not be optimal due to an inadequate volume of blood received in culture bottles   Culture  Setup Time   Final    GRAM POSITIVE COCCI IN CLUSTERS IN BOTH AEROBIC AND ANAEROBIC BOTTLES CRITICAL VALUE NOTED.  VALUE IS CONSISTENT WITH PREVIOUSLY REPORTED AND CALLED VALUE.    Culture (A)  Final    STAPHYLOCOCCUS AUREUS SUSCEPTIBILITIES PERFORMED ON PREVIOUS CULTURE WITHIN THE LAST 5 DAYS. Performed at [facility], [address], [place], [place] [address]    Report Status  01/15/2021 FINAL  Final  Culture, blood (routine x 2)     Status: None   Collection Time: 01/12/21 11:52 AM   Specimen: BLOOD LEFT HAND  Result Value Ref Range Status   Specimen Description BLOOD LEFT HAND  Final   Special Requests   Final    BOTTLES DRAWN AEROBIC ONLY Blood Culture adequate volume   Culture   Final    NO GROWTH 5 DAYS Performed at [facility], [address], [place], [place] [address]    Report Status 01/17/2021 FINAL  Final  Culture, blood (routine x 2)     Status: None   Collection Time: 01/14/21  5:42 AM   Specimen: BLOOD RIGHT HAND  Result Value Ref Range Status   Specimen Description BLOOD RIGHT HAND  Final   Special Requests   Final    BOTTLES DRAWN AEROBIC AND ANAEROBIC Blood Culture adequate volume   Culture   Final    NO GROWTH 5 DAYS Performed at [facility], [address], [place], [place] [address]    Report Status 01/19/2021 FINAL  Final  Culture, blood (routine x 2)     Status: None   Collection Time: 01/14/21  5:42 AM   Specimen: BLOOD LEFT HAND  Result Value Ref Range Status   Specimen Description BLOOD LEFT HAND  Final   Special Requests   Final    BOTTLES DRAWN AEROBIC AND ANAEROBIC Blood Culture adequate volume   Culture   Final    NO GROWTH 5 DAYS Performed at [facility], [address] [place], [place] [address]    Report Status 01/19/2021 FINAL  Final  Culture, blood (routine x 2)     Status: None (Preliminary result)   Collection Time: 01/16/21  6:43 AM   Specimen: BLOOD RIGHT HAND  Result Value Ref Range Status   Specimen Description BLOOD RIGHT HAND  Final   Special Requests   Final    BOTTLES DRAWN AEROBIC ONLY Blood Culture adequate volume   Culture   Final    NO GROWTH 4 DAYS Performed at [facility], [address] [place], [place]  [address]    Report Status PENDING  Incomplete  Culture, blood (routine x 2)     Status: None (Preliminary result)   Collection Time: 01/16/21  6:44 AM    Specimen: BLOOD LEFT HAND  Result Value Ref Range Status   Specimen Description BLOOD LEFT HAND  Final   Special Requests   Final    BOTTLES DRAWN AEROBIC AND ANAEROBIC Blood Culture adequate volume   Culture   Final    NO GROWTH 4 DAYS Performed at [facility], [address] [place], [place] [address]    Report Status PENDING  Incomplete  Culture, blood (routine x 2)     Status: None (Preliminary result)   Collection Time: 01/18/21  5:30 AM   Specimen: BLOOD RIGHT HAND  Result Value Ref Range Status   Specimen Description BLOOD RIGHT HAND  Final   Special Requests   Final    BOTTLES DRAWN AEROBIC ONLY Blood Culture adequate volume   Culture   Final    NO GROWTH 2 DAYS Performed at [facility], [address] [place], [place] [address]    Report Status PENDING  Incomplete  Culture, blood (routine x 2)     Status: None (Preliminary result)   Collection Time: 01/18/21  5:34 AM   Specimen: BLOOD LEFT HAND  Result Value Ref Range Status   Specimen Description BLOOD LEFT HAND  Final   Special Requests   Final    BOTTLES DRAWN AEROBIC ONLY Blood Culture adequate volume   Culture   Final    NO GROWTH 2 DAYS Performed at [facility], [address] [place], [place] [address]    Report Status PENDING  Incomplete     Serology:   Imaging: If present, new imagings (plain films, ct scans, and mri) have been personally visualized and interpreted; radiology reports have been reviewed. Decision making incorporated into the Impression / Recommendations.  7/13 mri thoracic lumbar Paraspinal and other soft tissues: Patient is status post left laminectomy at L3. Fluid tracks from the laminectomy site into a subcutaneous and deep soft tissue collection with peripheral enhancement measuring 4.7 x 5.0 x 1.7 cm. The ventral epidural collection extends inferiorly to the L2 level. Separate prominent ventral collection is present posterior to L4 prominently right  the left. This collection measures 2.9 x 1.3 cm on the sagittal images.   Disc levels:   In addition to the epidural collections, disc disease is again noted at L4-5 and L5-S1. Central foraminal narrowing are associated.   IMPRESSION: 1. T7 laminectomy subcutaneous peripherally enhancing fluid collection at this level as well. 2. Residual posterior epidural fluid collection in the thoracic spine with similar cephalo caudad extension from T5-6-T9 but less mass effect. 3. Progressive signal abnormality and enhancement throughout the T7 and T8 vertebral bodies in the T7-8 thoracic disc consistent with disc osteomyelitis. 4. Progressive paraspinous soft tissue enhancement at T3-4 through T9-10 without drainable collection. 5. Persistent extensive ventral epidural collection compatible with abscess. 6. Status post left laminectomy at L3 with peripherally enhancing fluid collection extending from the operative site into the subcutaneous and deep tissues as described. This is concerning abscess. 7. Fluid tracks from the laminectomy site into a subdural deep soft tissue collection measuring 4.7 x 5.0 x 1.7 cm. 8. Separate ventral epidural collection at L4-5 and L5-S1 consistent with epidural abscess. 9. Progressive signal abnormality and enhancement in the L3 and L4 vertebral bodies. 10. Although there is not definite enhancement  in the L4-5 and L5-S1 discs, progressive T2 signal and paravertebral soft tissue enhancement is highly concerning for progressive disc osteomyelitis.   7/17 tte  1. Left ventricular ejection fraction, by estimation, is 55 to 60%. The  left ventricle has normal function. The left ventricle has no regional  wall motion abnormalities. There is mild concentric left ventricular  hypertrophy.   2. Right ventricular systolic function is normal. The right ventricular  size is normal.   3. The mitral valve is grossly normal. Trivial mitral valve  regurgitation. No  evidence of mitral stenosis.   4. The aortic valve is grossly normal. Aortic valve regurgitation is not  visualized. No aortic stenosis is present.   5. The inferior vena cava is normal in size with greater than 50%  respiratory variability, suggesting right atrial pressure of 3 mmHg.   7/19 tee No valvular vegetation  7/20 ct abd pelv with contrast Progressive erosive changes involving the L3 and L4 vertebral bodies in keeping with progressive changes of discitis osteomyelitis. No pathologic fracture. Increasing paraspinal inflammatory collections adjacent to L3-L5 bilaterally without discrete drainable fluid at this time.   Progressive anasarca with development of pleural effusions, mild ascites, and moderate diffuse subcutaneous body wall edema.   Aortic Atherosclerosis    Raymondo Band, MD [facility] [phone] pager    01/20/2021, 3:08 PM

## 2021-01-20 NOTE — Progress Notes (Signed)
Physical Therapy Treatment Patient Details Name: Louis [name] MRN: [id number] DOB: Aug 22, 1965 Today's Date: 01/20/2021    History of Present Illness Pt is a 55 y.o. male who presented 7/13 with worsening back pain with recurrent MRSA bacteremia after discharging 7/3 from being admitted with epidural abscess, vertebral osteomyelitis L3-S1, psoas abscess, and MRSA bacteremia with s/p 12/21/20 T7-8, T12-L1, L3-4 laminectomies for evacuation of epidural abscess. 7/13 mri lumbar/thoracic showed several areas of fluid collection with a more loculated focus at L4-5. 7/20 imaging revealed progressive erosive changes involving the L3 and L4 vertebral bodies  in keeping with progressive changes of discitis osteomyelitis. PMH: CAD, hypertension, uncontrolled diabetes mellitus.    PT Comments    Pt was able to progress to beginning to place weight through his feet to stand today using the tilt bed. He remains limited in tolerating an upright position due to pain and nausea, but was able to tolerate a ~30 degree incline in the tilt bed with 16 kg of weight being applied for ~3 minutes. Plan to continue to progress pt's tolerance to upright positions using tilt bed. Will continue to follow acutely. Recommending CIR.   Follow Up Recommendations  CIR;Supervision/Assistance - 24 hour     Equipment Recommendations  Hospital bed    Recommendations for Other Services       Precautions / Restrictions Precautions Precautions: Back Precaution Booklet Issued: No Restrictions Weight Bearing Restrictions: No    Mobility  Bed Mobility               General bed mobility comments: Deferred as this causes pt pain, rather focused on using tilt bed today. Angle: 30 degrees (got to 42 degrees but unable to tolerate, reduced to ~30) Total Minutes in Angle: 3 minutes Patient Response: Anxious;Restless  Transfers Overall transfer level: Needs assistance Equipment used:  (tilt bed) Transfers: Sit to/from  Stand Sit to Stand: Total assist (via tilt bed)         General transfer comment: Used tilt bed to reduce pain caused by bed mobility that limits his OOB mobility. Pt able to tolerate inclining up to 42 degrees before becoming nauseated and displaying pain requesting to reduce incline. Reduced to ~30 degrees and maintained for ~3 min with bed reporting 16 kg of weight being applied. Strap applied to abdomen, thighs, and lower legs at levels 5, 3, and 1  Ambulation/Gait             General Gait Details: Deferred due to pain and nausea   Stairs             Wheelchair Mobility    Modified Rankin (Stroke Patients Only) Modified Rankin (Stroke Patients Only) Pre-Morbid Rankin Score: No symptoms Modified Rankin: Severe disability     Balance               Standing balance comment: Used tilt bed to beging standing. Pt able to tolerate inclining up to 42 degrees before becoming nauseated and displaying pain requesting to reduce incline. Reduced to ~30 degrees and maintained for ~3 min with bed reporting 16 kg of weight being applied. Strap applied to abdomen, thighs, and lower legs at levels 5, 3, and 1                            Cognition Arousal/Alertness: Lethargic Behavior During Therapy: Restless;Agitated Overall Cognitive Status: Impaired/Different from baseline Area of Impairment: Memory;Following commands;Safety/judgement;Attention;Orientation;Awareness  Orientation Level: Disoriented to;Situation;Place;Time Current Attention Level: Selective Memory: Decreased short-term memory;Decreased recall of precautions Following Commands: Follows one step commands with increased time;Follows one step commands inconsistently Safety/Judgement: Decreased awareness of safety;Decreased awareness of deficits Awareness: Intellectual   General Comments: Pt lethargic, closing eyes majority of session. Pt reporting he was in a corn factory even  after being cued to look around and identify if an IV pole belongs in a corn factory. STM deficits, repeating he was in corn factory several minutes after being re-oriented to location, situation, and date. Unsure if pt may be hallucinating. Pt stated he slept 38 hrs yesterday and that there are 48 hrs in a day, displaying confusion.      Exercises      General Comments        Pertinent Vitals/Pain Pain Assessment: Faces Faces Pain Scale: Hurts even more Pain Location: back Pain Descriptors / Indicators: Discomfort;Grimacing;Guarding Pain Intervention(s): Monitored during session;Limited activity within patient's tolerance;Repositioned    Home Living                      Prior Function            PT Goals (current goals can now be found in the care plan section) Acute Rehab PT Goals Patient Stated Goal: to not hurt PT Goal Formulation: With patient/family Time For Goal Achievement: 01/27/21 Potential to Achieve Goals: Fair Progress towards PT goals: Progressing toward goals    Frequency    Min 5X/week      PT Plan Current plan remains appropriate    Co-evaluation              AM-PAC PT "6 Clicks" Mobility   Outcome Measure  Help needed turning from your back to your side while in a flat bed without using bedrails?: A Little Help needed moving from lying on your back to sitting on the side of a flat bed without using bedrails?: Total Help needed moving to and from a bed to a chair (including a wheelchair)?: Total Help needed standing up from a chair using your arms (e.g., wheelchair or bedside chair)?: Total Help needed to walk in hospital room?: Total Help needed climbing 3-5 steps with a railing? : Total 6 Click Score: 8    End of Session Equipment Utilized During Treatment: Other (comment) (tilt bed) Activity Tolerance: Patient limited by pain;Other (comment) (limited by nausea) Patient left: in bed;with call bell/phone within reach;with bed  alarm set Nurse Communication: Mobility status;Other (comment) (cognitive decline) PT Visit Diagnosis: Pain;Muscle weakness (generalized) (M62.81);Difficulty in walking, not elsewhere classified (R26.2);Unsteadiness on feet (R26.81) Pain - part of body: Leg     Time: 1520-1546 PT Time Calculation (min) (ACUTE ONLY): 26 min  Charges:  $Therapeutic Activity: 23-37 mins                     Raymond Gurney, PT, DPT Acute Rehabilitation Services  Pager: [phone] Office: [phone]    Jewel Baize 01/20/2021, 4:50 PM

## 2021-01-21 ENCOUNTER — Inpatient Hospital Stay: Payer: Federal, State, Local not specified - PPO | Admitting: Internal Medicine

## 2021-01-21 DIAGNOSIS — G062 Extradural and subdural abscess, unspecified: Secondary | ICD-10-CM | POA: Diagnosis not present

## 2021-01-21 DIAGNOSIS — I5032 Chronic diastolic (congestive) heart failure: Secondary | ICD-10-CM | POA: Diagnosis not present

## 2021-01-21 DIAGNOSIS — A419 Sepsis, unspecified organism: Secondary | ICD-10-CM | POA: Diagnosis not present

## 2021-01-21 DIAGNOSIS — G9341 Metabolic encephalopathy: Secondary | ICD-10-CM | POA: Diagnosis not present

## 2021-01-21 LAB — COMPREHENSIVE METABOLIC PANEL
ALT: 9 U/L (ref 0–44)
AST: 12 U/L — ABNORMAL LOW (ref 15–41)
Albumin: 2.6 g/dL — ABNORMAL LOW (ref 3.5–5.0)
Alkaline Phosphatase: 76 U/L (ref 38–126)
Anion gap: 10 (ref 5–15)
BUN: 7 mg/dL (ref 6–20)
CO2: 29 mmol/L (ref 22–32)
Calcium: 8.4 mg/dL — ABNORMAL LOW (ref 8.9–10.3)
Chloride: 92 mmol/L — ABNORMAL LOW (ref 98–111)
Creatinine, Ser: 0.44 mg/dL — ABNORMAL LOW (ref 0.61–1.24)
GFR, Estimated: >60 mL/min (ref >60)
Glucose, Bld: 201 mg/dL — ABNORMAL HIGH (ref 70–99)
Potassium: 4.1 mmol/L (ref 3.5–5.1)
Sodium: 131 mmol/L — ABNORMAL LOW (ref 135–145)
Total Bilirubin: 0.3 mg/dL (ref 0.3–1.2)
Total Protein: 6.1 g/dL — ABNORMAL LOW (ref 6.5–8.1)

## 2021-01-21 LAB — CBC WITH DIFFERENTIAL/PLATELET
Abs Immature Granulocytes: 0.05 10*3/uL (ref 0.00–0.07)
Basophils Absolute: 0.1 10*3/uL (ref 0.0–0.1)
Basophils Relative: 0 %
Eosinophils Absolute: 0.1 10*3/uL (ref 0.0–0.5)
Eosinophils Relative: 1 %
HCT: 27 % — ABNORMAL LOW (ref 39.0–52.0)
Hemoglobin: 8.8 g/dL — ABNORMAL LOW (ref 13.0–17.0)
Immature Granulocytes: 0 %
Lymphocytes Relative: 13 %
Lymphs Abs: 1.6 10*3/uL (ref 0.7–4.0)
MCH: 28.8 pg (ref 26.0–34.0)
MCHC: 32.6 g/dL (ref 30.0–36.0)
MCV: 88.2 fL (ref 80.0–100.0)
Monocytes Absolute: 1.1 10*3/uL — ABNORMAL HIGH (ref 0.1–1.0)
Monocytes Relative: 9 %
Neutro Abs: 9.5 10*3/uL — ABNORMAL HIGH (ref 1.7–7.7)
Neutrophils Relative %: 77 %
Platelets: 539 10*3/uL — ABNORMAL HIGH (ref 150–400)
RBC: 3.06 MIL/uL — ABNORMAL LOW (ref 4.22–5.81)
RDW: 15.9 % — ABNORMAL HIGH (ref 11.5–15.5)
WBC: 12.4 10*3/uL — ABNORMAL HIGH (ref 4.0–10.5)
nRBC: 0 % (ref 0.0–0.2)

## 2021-01-21 LAB — GLUCOSE, CAPILLARY
Glucose-Capillary: 202 mg/dL — ABNORMAL HIGH (ref 70–99)
Glucose-Capillary: 251 mg/dL — ABNORMAL HIGH (ref 70–99)
Glucose-Capillary: 251 mg/dL — ABNORMAL HIGH (ref 70–99)
Glucose-Capillary: 195 mg/dL — ABNORMAL HIGH (ref 70–99)
Glucose-Capillary: 127 mg/dL — ABNORMAL HIGH (ref 70–99)

## 2021-01-21 LAB — MAGNESIUM: Magnesium: 1.8 mg/dL (ref 1.7–2.4)

## 2021-01-21 LAB — PHOSPHORUS: Phosphorus: 2.8 mg/dL (ref 2.5–4.6)

## 2021-01-21 MED ORDER — INSULIN ASPART 100 UNIT/ML IJ SOLN
0.0000 [IU] | Freq: Every day | INTRAMUSCULAR | Status: DC
Start: 2021-01-21 — End: 2021-01-23

## 2021-01-21 MED ORDER — INSULIN ASPART 100 UNIT/ML IJ SOLN
4.0000 [IU] | Freq: Three times a day (TID) | INTRAMUSCULAR | Status: DC
  Administered 2021-01-25 – 2021-01-28 (×10): 4 [IU] via SUBCUTANEOUS

## 2021-01-21 MED ORDER — MELATONIN 3 MG PO TABS
3.0000 mg | ORAL_TABLET | Freq: Every day | ORAL | Status: DC
  Administered 2021-01-21 – 2021-01-27 (×7): 3 mg via ORAL
  Filled 2021-01-21 (×7): qty 1

## 2021-01-21 MED ORDER — INSULIN ASPART 100 UNIT/ML IJ SOLN
0.0000 [IU] | Freq: Three times a day (TID) | INTRAMUSCULAR | Status: DC
  Administered 2021-01-21: 8 [IU] via SUBCUTANEOUS
  Administered 2021-01-21: 3 [IU] via SUBCUTANEOUS
  Administered 2021-01-22: 2 [IU] via SUBCUTANEOUS
  Administered 2021-01-22: 3 [IU] via SUBCUTANEOUS
  Administered 2021-01-23: 2 [IU] via SUBCUTANEOUS

## 2021-01-21 MED ORDER — PROSOURCE TF PO LIQD: 45.0000 mL | Freq: Three times a day (TID) | ORAL | Status: DC

## 2021-01-21 MED ORDER — INSULIN GLARGINE 100 UNIT/ML ~~LOC~~ SOLN
15.0000 [IU] | Freq: Two times a day (BID) | SUBCUTANEOUS | Status: DC
  Administered 2021-01-21 – 2021-01-22 (×3): 15 [IU] via SUBCUTANEOUS
  Filled 2021-01-21 (×4): qty 0.15

## 2021-01-21 MED ORDER — HALOPERIDOL LACTATE 5 MG/ML IJ SOLN
2.0000 mg | Freq: Four times a day (QID) | INTRAMUSCULAR | Status: DC | PRN
  Administered 2021-01-24: 2 mg via INTRAVENOUS
  Filled 2021-01-21 (×2): qty 1

## 2021-01-21 MED ORDER — GLUCERNA 1.5 CAL PO LIQD
1000.0000 mL | ORAL | Status: DC
  Filled 2021-01-21: qty 1000

## 2021-01-21 NOTE — Progress Notes (Signed)
Nutrition Follow-up  DOCUMENTATION CODES:   Severe malnutrition in context of acute illness/injury  INTERVENTION:   Recommend replacing Cortrak NG tube and restarting tube feeds given severity of malnutrition and minimal PO intake for several days. Recommend: - Glucerna 1.5 @ 65 ml/hr (1560 ml/day)  Recommended tube feeding regimen would provide 2340 kcal, 129 grams of protein, and 1184 ml of H2O.  - Encourage PO intake  - Continue MVI with minerals daily  - Continue Ensure Enlive po TID, each supplement provides 350 kcal and 20 grams of protein  - Add Magic Cup TID with meals, each supplement provides 290 kcal and 9 grams of protein  NUTRITION DIAGNOSIS:   Severe Malnutrition related to acute illness (MRSA bacteremia) as evidenced by mild fat depletion, moderate muscle depletion, energy intake < or equal to 50% for > or equal to 5 days.  Ongoing  GOAL:   Patient will meet greater than or equal to 90% of their needs  Unmet at this time  MONITOR:   PO intake, Supplement acceptance, Labs, Weight trends, Skin, I & O's  REASON FOR ASSESSMENT:   Consult Assessment of nutrition requirement/status  ASSESSMENT:   55 year old male who presented to the ED on 7/13 with increased back pain after thoracic and lumbar laminectomies on 12/21/20. Pt with recent admission for epidural abscess, vertebral osteomyelitis, psoas abscess, and MRSA bacteremia. PMH of CAD, T2DM, HTN, HLD, CAD s/p CABG in 2017, anxiety.  7/16 - s/p US aspiration of lumbar paraspinal collection 7/19 - s/p TEE without vegetation 7/22 - pt with N/V 7/25 - Cortrak placed (tip post-pyloric), TF initiated  Discussed pt at length with RN. Pt with increased agitation and confusion and pulled Cortrak out this morning. Cortrak team came to bedside to replace Cortrak tube. However, MD would not like Cortrak to be replaced despite pt's severe malnutrition and ongoing inadequate PO intake. RD recommends replacing Cortrak  NG tube and resuming tube feeds given severity of pt's malnutrition as well as ongoing poor PO intake. If Cortrak were to be replaced, recommend considering restraints as pt is agitated and likely to try to pull Cortrak out again.  Discussed situation with pt's wife at bedside. Pt's wife reports that pt did not eat anything yesterday and has not eaten anything so far today. MD is aware. RD to increase oral nutrition supplement regimen. Suspect that pt will still be unable to meet his nutritional needs via PO route.  Pt with +2 pitting generalized edema and +3 pitting edema to BLE and LUE.  Medications reviewed and include: Ensure Enlive TID, SSI, novolog 4 units TID with meals, lantus 15 units BID, melatonin, MVI with minerals daily, IV protonix, psyllium, IV abx  Labs reviewed: sodium 131, hemoglobin 8.8 CBG's: 202-273 x 24 hours  UOP: 351 ml x 12 hours I/O's: -9.7 L since admit  Diet Order:   Diet Order             Diet regular Room service appropriate? Yes; Fluid consistency: Thin  Diet effective now                   EDUCATION NEEDS:   Education needs have been addressed  Skin:  Skin Assessment: Skin Integrity Issues: Incisions: back  Last BM:  01/20/21 large type 6  Height:   Ht Readings from Last 1 Encounters:  12/21/20 5\' 11"  (1.803 m)    Weight:   Wt Readings from Last 1 Encounters:  01/13/21 94.3 kg    BMI:  Body mass index is 29.01 kg/m.  Estimated Nutritional Needs:   Kcal:  2300-2500  Protein:  115-130 grams  Fluid:  >/= 2.0 L    Mertie Clause, MS, RD, LDN Inpatient Clinical Dietitian Please see AMiON for contact information.

## 2021-01-21 NOTE — Progress Notes (Signed)
[facility] PROGRESS NOTE    Progress Note  Louis [name]  CXK:[id number] DOB: 1965-09-30 DOA: 01/05/2021 PCP: Bobette Mo, NP     Brief Narrative:   Louis [name] is an 55 y.o. 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 01/11/2021 TEE negative for vegetation. 01/12/2021 CT scan of the abdomen pelvis showed progressive anasarca progressive erosion of L3-L4 with increasing paraspinal inflammatory collections adjacent to L3-L5 without drainable fluid at this time. 01/14/2021 blood cultures negative till date.   Assessment/Plan:   Sepsis and secondary to discitis and epidural abscess/discitis: Sepsis resolved on 01/13/2021.  Was on IV daptomycin Status postlaminectomy and evacuation of abscess on 12/21/2020. MRI of the lumbar spine showed multiple fluid collections and L4-L5, neurosurgery felt no need for surgical intervention as he was not neurological compromise. Daptomycin MIC was not susceptible on lab from 01/06/2019 to my explain why he is not clearing the infection. Negative agitation TEE and TEE.  On 01/08/2021 IR placed a percutaneous drain on the 5 cm L3 level subdural fluid collection. White blood cell count is improving on vancomycin and cefazolin started on 01/13/2021 Cont IV vancomycin and  certraline until 01/28/2021 and switch back to IV vancomycin alone.  We will keep in-house until 01/28/2021 and repeat MRI.  Persistent MRSA bacteremia: Blood cultures on 01/05/2021, 01/08/2019, 01/09/2021, 01/11/2021, 01/12/2021 remain positive for MRSA. Surveillance blood cultures on 01/14/2021 have been negative till date Repeated blood cultures on 01/16/2021 and 01/18/2021 have remained negative till date. ID recommended to continue IV vancomycin and ceftaroline the plan to continue IV empiric antibiotics until 01/28/2021 and then continue IV vancomycin alone.  Acute metabolic encephalopathy: Became encephalopathic, likely multifactorial in the setting of opioids, metabolic and infectious etiology.   MRI of the brain showed no acute findings. We have modified his narcotic regimen to see Neurontin all other sedatives.  Severe protein caloric malnutrition/anasarca: Prealbumin of 5.9. Will change feedings to nocturnal. Core track in place on 01/17/2021. Will on a regular diet.  Uncontrolled diabetes mellitus type 2: On tube feedings blood glucose elevated, will increase long-acting insulin change him to sliding scale myeloma regular diet.  Mechanical fall: Patient had a fall on 12/31/2020. CBD CT scan of the head showed no acute findings.  Continue fall precautions.  Cholestasis: Reactive due to infectious trends complete metabolic panel.  GERD: Continue Protonix.  Oral thrush: Completed 7-day course of Diflucan.  Hypokalemia: Replete orally now resolved.  Hypomagnesemia: Repleted now resolved.  Thrombocytosis: Asked acting as an acute phase reactant.  Abnormal EKG: Had ST segment elevation V1 V2 without reciprocal changes cardiology was consulted and a 2D echo showed no wall motion abnormalities cardiac biomarkers remain remained flat.  Chronic diastolic heart failure: Appears anasarca on physical exam likely due to low albumin. His nutrition has been started on tube  feedings. Slowly improving.  Hypovolemic hyponatremia: He is third spacing, his albumin is slowly  started to trend down and he is currently on nutrition feeds.   DVT prophylaxis: lovenox Family Communication:none Status is: Inpatient  Remains inpatient appropriate because:Hemodynamically unstable  Dispo: The patient is from: Home              Anticipated d/c is to: [facility]              Patient currently is not medically stable to d/c.   Difficult to place patient No    Code Status:     Code Status Orders  (From admission, onward)           Start     Ordered   01/06/21 0224  Full code  Continuous        01/06/21 0226           Code Status History     Date Active Date Inactive Code Status Order ID Comments User Context   12/12/2020 2253 12/26/2020 2008 Full Code [id number]  Synetta Fail, MD ED         IV Access:   Peripheral IV   Procedures and diagnostic studies:   No results found.   Medical Consultants:   None.   Subjective:    Louis [name] continues to be more awake able to carry on a conversation once to have a bowel movement.  Objective:    Vitals:   01/20/21 1140 01/20/21 1504 01/20/21 2007 01/21/21 0000  BP: 136/87 105/73  (!) 148/92  Pulse:  98 96 99  Resp: 15 16 16 19   Temp: 98.2 F (36.8 C) 98 F (36.7 C) 98.3 F (36.8 C) 97.9 F (36.6 C)  TempSrc: Oral Oral Axillary Axillary  SpO2:  96% 94%   Weight:       SpO2: 94 % O2 Flow Rate (L/min): 1 L/min   Intake/Output Summary (Last 24 hours) at 01/21/2021 0711 Last data filed at 01/20/2021 1227 Gross per 24 hour  Intake --  Output 352 ml  Net -352 ml    Filed Weights   01/09/21 1700 01/13/21 1436  Weight: 92.9 kg 94.3 kg    Exam: General exam: In no acute distress. Respiratory system: Good air movement and clear to auscultation. Cardiovascular system: S1 & S2 heard, RRR. No JVD. Gastrointestinal system: Abdomen is nondistended, soft and nontender.  Extremities:  No pedal edema. Skin: No rashes, lesions or ulcers  Data Reviewed:    Labs: Basic Metabolic Panel: Recent Labs  Lab 01/17/21 0220 01/17/21 1859 01/18/21 0530 01/19/21 0347 01/20/21 0221 01/21/21 0252  NA 132*  --  134* 133* 131* 131*  K 2.9* 3.0* 2.9* 3.7 3.8 4.1  CL 89*  --  92* 93* 90* 92*  CO2 33*  --  34* 33* 33* 29  GLUCOSE 199*  --  212* 261* 271* 201*  BUN <5*  --  6 7 6 7   CREATININE 0.46*  --  0.53* 0.42* 0.52* 0.44*  CALCIUM 8.3*  --  8.2* 8.0* 8.0* 8.4*  MG 1.5* 2.1 1.9 1.9 1.9 1.8  PHOS 2.9 2.9 2.8 2.7 2.6 2.8    GFR Estimated Creatinine Clearance: 119.5 mL/min (A) (by C-G formula based on SCr of 0.44 mg/dL (L)). Liver Function Tests: Recent Labs  Lab 01/17/21 0220 01/18/21 0530 01/19/21 0347 01/20/21 0221 01/21/21 0252  AST 13* 13* 11* 11* 12*  ALT 11 11 10 9 9   ALKPHOS 66 64 67 66 76  BILITOT 1.2 0.8 0.7 0.3 0.3  PROT 6.0* 5.9* 5.5* 5.9* 6.1*  ALBUMIN 3.2* 2.7* 2.5* 2.5* 2.6*    No results for input(s): LIPASE, AMYLASE in the last 168 hours. No results for input(s): AMMONIA in the last 168 hours. Coagulation profile No results for input(s): INR, PROTIME in the last 168 hours. COVID-19 Labs  No results for input(s): DDIMER, FERRITIN, LDH, CRP in the last 72 hours.  Lab Results  Component Value Date   SARSCOV2NAA NEGATIVE 01/05/2021   SARSCOV2NAA NEGATIVE 12/12/2020    CBC: Recent Labs  Lab 01/17/21 0220 01/18/21 0530 01/19/21 0347 01/20/21 0221 01/21/21 0252  WBC 11.9* 12.8* 11.4* 9.5 12.4*  NEUTROABS 8.9* 10.7* 8.3* 7.2 9.5*  HGB 7.8* 7.8* 8.0* 8.1* 8.8*  HCT 24.2* 23.6* 24.3* 24.4* 27.0*  MCV 87.7 87.1 87.4 87.5 88.2  PLT 586* 573* 517* 532* 539*    Cardiac Enzymes: No results for input(s): CKTOTAL, CKMB, CKMBINDEX, TROPONINI in the last 168 hours. BNP (last 3 results) No results for input(s): PROBNP in the last 8760 hours. CBG: Recent Labs  Lab 01/20/21 1212 01/20/21 1504 01/20/21 2028 01/20/21 2329 01/21/21 0401   GLUCAP 273* 223* 267* 228* 202*    D-Dimer: No results for input(s): DDIMER in the last 72 hours. Hgb A1c: No results for input(s): HGBA1C in the last 72 hours. Lipid Profile: No results for input(s): CHOL, HDL, LDLCALC, TRIG, CHOLHDL, LDLDIRECT in the last 72 hours. Thyroid function studies: No results for input(s): TSH, T4TOTAL, T3FREE, THYROIDAB in the last 72 hours.  Invalid input(s): FREET3 Anemia work up: No results for input(s): VITAMINB12, FOLATE, FERRITIN, TIBC, IRON, RETICCTPCT in the last 72 hours. Sepsis Labs: Recent Labs  Lab 01/18/21 0530 01/19/21 0347 01/20/21 0221 01/21/21 0252  WBC 12.8* 11.4* 9.5 12.4*    Microbiology Recent Results (from the past 240 hour(s))  Culture, blood (routine x 2)     Status: Abnormal   Collection Time: 01/11/21  2:02 PM   Specimen: BLOOD RIGHT HAND  Result Value Ref Range Status   Specimen Description BLOOD RIGHT HAND  Final   Special Requests   Final    BOTTLES DRAWN AEROBIC AND ANAEROBIC Blood Culture adequate volume   Culture  Setup Time   Final    GRAM POSITIVE COCCI IN CLUSTERS IN BOTH AEROBIC AND ANAEROBIC BOTTLES CRITICAL RESULT CALLED TO, READ BACK BY AND VERIFIED WITH: J. LEDFORD PHARMD, AT 0630 01/12/21 D. VANHOOK    Culture (A)  Final    STAPHYLOCOCCUS AUREUS SUSCEPTIBILITIES PERFORMED ON PREVIOUS CULTURE WITHIN THE LAST 5 DAYS. Performed at [facility], [address] [place], [place] [address]    Report Status 01/14/2021 FINAL  Final  Culture, blood (routine x 2)     Status: Abnormal   Collection Time: 01/11/21  2:03 PM   Specimen: BLOOD LEFT HAND  Result Value Ref Range Status   Specimen Description BLOOD LEFT HAND  Final   Special Requests   Final    BOTTLES DRAWN AEROBIC AND ANAEROBIC Blood Culture adequate volume   Culture  Setup Time   Final    GRAM POSITIVE COCCI IN CLUSTERS ANAEROBIC BOTTLE ONLY CRITICAL VALUE NOTED.  VALUE IS CONSISTENT WITH PREVIOUSLY REPORTED AND CALLED  VALUE. Performed at [facility], [address], [place], [place] [address]    Culture METHICILLIN RESISTANT STAPHYLOCOCCUS AUREUS (A)  Final   Report Status 01/14/2021 FINAL  Final   Organism ID, Bacteria METHICILLIN RESISTANT STAPHYLOCOCCUS AUREUS  Final      Susceptibility   Methicillin resistant staphylococcus aureus - MIC*  CIPROFLOXACIN >=8 RESISTANT Resistant     ERYTHROMYCIN >=8 RESISTANT Resistant     GENTAMICIN <=0.5 SENSITIVE Sensitive     OXACILLIN >=4 RESISTANT Resistant     TETRACYCLINE <=1 SENSITIVE Sensitive     VANCOMYCIN <=0.5 SENSITIVE Sensitive     TRIMETH/SULFA >=320 RESISTANT Resistant     CLINDAMYCIN <=0.25 SENSITIVE Sensitive     RIFAMPIN <=0.5 SENSITIVE Sensitive     Inducible Clindamycin NEGATIVE Sensitive     * METHICILLIN RESISTANT STAPHYLOCOCCUS AUREUS  Culture, blood (routine x 2)     Status: Abnormal   Collection Time: 01/12/21 11:47 AM   Specimen: BLOOD RIGHT HAND  Result Value Ref Range Status   Specimen Description BLOOD RIGHT HAND  Final   Special Requests   Final    BOTTLES DRAWN AEROBIC AND ANAEROBIC Blood Culture results may not be optimal due to an inadequate volume of blood received in culture bottles   Culture  Setup Time   Final    GRAM POSITIVE COCCI IN CLUSTERS IN BOTH AEROBIC AND ANAEROBIC BOTTLES CRITICAL VALUE NOTED.  VALUE IS CONSISTENT WITH PREVIOUSLY REPORTED AND CALLED VALUE.    Culture (A)  Final    STAPHYLOCOCCUS AUREUS SUSCEPTIBILITIES PERFORMED ON PREVIOUS CULTURE WITHIN THE LAST 5 DAYS. Performed at [facility], [address] [place], [place] [address]    Report Status 01/15/2021 FINAL  Final  Culture, blood (routine x 2)     Status: None   Collection Time: 01/12/21 11:52 AM   Specimen: BLOOD LEFT HAND  Result Value Ref Range Status   Specimen Description BLOOD LEFT HAND  Final   Special Requests   Final    BOTTLES DRAWN AEROBIC ONLY Blood Culture adequate volume   Culture   Final    NO GROWTH 5  DAYS Performed at [facility], [address] [place], [place] [address]    Report Status 01/17/2021 FINAL  Final  Culture, blood (routine x 2)     Status: None   Collection Time: 01/14/21  5:42 AM   Specimen: BLOOD RIGHT HAND  Result Value Ref Range Status   Specimen Description BLOOD RIGHT HAND  Final   Special Requests   Final    BOTTLES DRAWN AEROBIC AND ANAEROBIC Blood Culture adequate volume   Culture   Final    NO GROWTH 5 DAYS Performed at [facility], [address] [place], [place] [address]    Report Status 01/19/2021 FINAL  Final  Culture, blood (routine x 2)     Status: None   Collection Time: 01/14/21  5:42 AM   Specimen: BLOOD LEFT HAND  Result Value Ref Range Status   Specimen Description BLOOD LEFT HAND  Final   Special Requests   Final    BOTTLES DRAWN AEROBIC AND ANAEROBIC Blood Culture adequate volume   Culture   Final    NO GROWTH 5 DAYS Performed at [facility], [address] [place], [place] [address]    Report Status 01/19/2021 FINAL  Final  Culture, blood (routine x 2)     Status: None (Preliminary result)   Collection Time: 01/16/21  6:43 AM   Specimen: BLOOD RIGHT HAND  Result Value Ref Range Status   Specimen Description BLOOD RIGHT HAND  Final   Special Requests   Final    BOTTLES DRAWN AEROBIC ONLY Blood Culture adequate volume   Culture   Final    NO GROWTH 4 DAYS Performed at [facility], [address], [place], [place] [address]    Report Status PENDING  Incomplete  Culture, blood (routine x 2)     Status: None (Preliminary result)   Collection Time: 01/16/21  6:44 AM   Specimen: BLOOD LEFT HAND  Result Value Ref Range Status   Specimen Description BLOOD LEFT HAND  Final   Special Requests   Final    BOTTLES DRAWN AEROBIC AND ANAEROBIC Blood Culture adequate volume   Culture   Final    NO GROWTH 4 DAYS Performed at [facility], [address], [place], [place] [address]    Report Status PENDING   Incomplete  Culture, blood (routine x 2)     Status: None (Preliminary result)   Collection Time: 01/18/21  5:30 AM   Specimen: BLOOD RIGHT HAND  Result Value Ref Range Status   Specimen Description BLOOD RIGHT HAND  Final   Special Requests   Final    BOTTLES DRAWN AEROBIC ONLY Blood Culture adequate volume   Culture   Final    NO GROWTH 2 DAYS Performed at [facility], [address] [place], [place] [address]    Report Status PENDING  Incomplete  Culture, blood (routine x 2)     Status: None (Preliminary result)   Collection Time: 01/18/21  5:34 AM   Specimen: BLOOD LEFT HAND  Result Value Ref Range Status   Specimen Description BLOOD LEFT HAND  Final   Special Requests   Final    BOTTLES DRAWN AEROBIC ONLY Blood Culture adequate volume   Culture   Final    NO GROWTH 2 DAYS Performed at [facility], [address] [place], [place] [address]    Report Status PENDING  Incomplete     Medications:    amLODipine  5 mg Oral Daily   aspirin EC  81 mg Oral Daily   atorvastatin  40 mg Oral QHS   enoxaparin (LOVENOX) injection  40 mg Subcutaneous QHS   feeding supplement  237 mL Oral TID BM   insulin aspart  0-9 Units Subcutaneous Q4H   insulin glargine  8 Units Subcutaneous BID   losartan  25 mg Oral q morning   mouth rinse  15 mL Mouth Rinse BID   metoprolol succinate  25 mg Oral Daily   multivitamin with minerals  1 tablet Oral Daily   oxyCODONE  10 mg Oral Q12H   pantoprazole (PROTONIX) IV  40 mg Intravenous BID   psyllium  1 packet Oral Daily   venlafaxine XR  75 mg Oral q morning   Continuous Infusions:  sodium chloride 75 mL/hr at 01/20/21 0830   ceFTAROline (TEFLARO) IV 600 mg (01/20/21 2323)   feeding supplement (GLUCERNA 1.5 CAL) 1,000 mL (01/20/21 1215)   vancomycin 1,750 mg (01/21/21 0406)      LOS: 15 days   Marinda Elk  [facility]  01/21/2021, 7:11 AM

## 2021-01-21 NOTE — Progress Notes (Signed)
Physical Therapy Treatment Patient Details Name: Louis [name] MRN: [id number] DOB: 02-13-1966 Today's Date: 01/21/2021    History of Present Illness Pt is a 55 y.o. male who presented 7/13 with worsening back pain with recurrent MRSA bacteremia after discharging 7/3 from being admitted with epidural abscess, vertebral osteomyelitis L3-S1, psoas abscess, and MRSA bacteremia with s/p 12/21/20 T7-8, T12-L1, L3-4 laminectomies for evacuation of epidural abscess. 7/13 mri lumbar/thoracic showed several areas of fluid collection with a more loculated focus at L4-5. 7/20 imaging revealed progressive erosive changes involving the L3 and L4 vertebral bodies  in keeping with progressive changes of discitis osteomyelitis. PMH: CAD, hypertension, uncontrolled diabetes mellitus.    PT Comments    Pt continues to be confused and disoriented to date, location, and situation, only remembering location by the end of the session. He was able to problem-solve his location with cues though. Pt able to tolerate tilt bed placed at 30 degrees for an increased time of 5 minutes today while performing several exercises in this position. Pt was unable to tolerate tile bed at 46 degrees though due to pain in back and L lower extremity. Educated pt and his wife on delirium precautions and HEP handout to encourage improved muscle strength and cognitive function. Will continue to follow acutely. Current recommendations remain appropriate.    Follow Up Recommendations  CIR;Supervision/Assistance - 24 hour     Equipment Recommendations  Hospital bed    Recommendations for Other Services       Precautions / Restrictions Precautions Precautions: Back Precaution Booklet Issued: No Restrictions Weight Bearing Restrictions: No    Mobility  Bed Mobility               General bed mobility comments: Deferred as this causes pt pain, rather focused on using tilt bed today. Start Time: 1152 Angle: 30 degrees (got to 46  degrees but unable to tolerate more than briefly) Total Minutes in Angle: 5 minutes Patient Response: Restless (painful)  Transfers Overall transfer level: Needs assistance Equipment used:  (tilt bed) Transfers: Sit to/from Stand Sit to Stand: Total assist (via tilt bed)         General transfer comment: Used tilt bed to reduce pain caused by bed mobility that limits his OOB mobility. Pt able to tolerate inclining up to 46 degrees briefly before he was unable to tolerate the pain requesting to reduce incline. Reduced to 30 degrees and maintained for ~5 min. Strap applied to abdomen, thighs, and lower legs at levels 6, 3, and 1  Ambulation/Gait             General Gait Details: Deferred due to pain and nausea   Stairs             Wheelchair Mobility    Modified Rankin (Stroke Patients Only) Modified Rankin (Stroke Patients Only) Pre-Morbid Rankin Score: No symptoms Modified Rankin: Severe disability     Balance               Standing balance comment: Used tilt bed to beging standing. Pt able to tolerate inclining up to 46 degrees briefly before he was unable to tolerate the pain requesting to reduce incline. Reduced to 30 degrees and maintained for ~5 min. Strap applied to abdomen, thighs, and lower legs at levels 6, 3, and 1                            Cognition Arousal/Alertness: Lethargic Behavior During  Therapy: Restless Overall Cognitive Status: Impaired/Different from baseline Area of Impairment: Memory;Following commands;Safety/judgement;Attention;Orientation;Awareness                 Orientation Level: Disoriented to;Situation;Place;Time Current Attention Level: Sustained Memory: Decreased short-term memory;Decreased recall of precautions Following Commands: Follows one step commands with increased time;Follows one step commands inconsistently Safety/Judgement: Decreased awareness of safety;Decreased awareness of  deficits Awareness: Intellectual   General Comments: Pt keeping eyes closed majority of time, needing repeated cues to open. Stated year was 2053, month was 7th month but stated that was August, and that [place] and eventually problem-solved to identify he was in a hospital through cues to look around and identify objects and where these specific medical objects belong. Asked again at end of session same questions with him remembering he was in a hospital in [place] but stated the month and year were June 2023. Pt with poor insight into deficits and safety and necessity to mobilize to improve and reach his goals. Needs continual encouragement and extra time to process all cues.      Exercises General Exercises - Upper Extremity Shoulder Flexion: AROM;Both;5 reps;Other (comment) (tilt bed at 30 degrees) Elbow Flexion: AROM;Both;Other reps (comment);Other (comment);Supine (x7 reps (x2 tilt bed at 30 degrees, x5 supine bed flat)) General Exercises - Lower Extremity Quad Sets: AROM;Both;5 reps;Supine Heel Slides: AROM;Both;5 reps;Supine Mini-Sqauts: AAROM;Both;5 reps;Other (comment) (tilt bed at 30 degrees, cued to push up in bed to extend legs)    General Comments General comments (skin integrity, edema, etc.): educated wife on HEP handout to perform and dilerium precautions      Pertinent Vitals/Pain Pain Assessment: Faces Faces Pain Scale: Hurts whole lot Pain Location: back, L posterior leg Pain Descriptors / Indicators: Discomfort;Grimacing;Guarding;Cramping Pain Intervention(s): Limited activity within patient's tolerance;Repositioned;Monitored during session;Premedicated before session    Home Living                      Prior Function            PT Goals (current goals can now be found in the care plan section) Acute Rehab PT Goals Patient Stated Goal: to go home PT Goal Formulation: With patient/family Time For Goal Achievement:  01/27/21 Potential to Achieve Goals: Fair Progress towards PT goals: Progressing toward goals    Frequency    Min 5X/week      PT Plan Current plan remains appropriate    Co-evaluation              AM-PAC PT "6 Clicks" Mobility   Outcome Measure  Help needed turning from your back to your side while in a flat bed without using bedrails?: A Little Help needed moving from lying on your back to sitting on the side of a flat bed without using bedrails?: Total Help needed moving to and from a bed to a chair (including a wheelchair)?: Total Help needed standing up from a chair using your arms (e.g., wheelchair or bedside chair)?: Total Help needed to walk in hospital room?: Total Help needed climbing 3-5 steps with a railing? : Total 6 Click Score: 8    End of Session Equipment Utilized During Treatment: Other (comment) (tilt bed) Activity Tolerance: Patient limited by pain Patient left: in bed;with call bell/phone within reach;Other (comment) (bed alarm would not set without alarming) Nurse Communication: Mobility status;Other (comment) (cognitive decline; bed alarm not setting without alarming) PT Visit Diagnosis: Pain;Muscle weakness (generalized) (M62.81);Difficulty in walking,  not elsewhere classified (R26.2);Unsteadiness on feet (R26.81) Pain - Right/Left: Left Pain - part of body: Leg     Time: 4503-8882 PT Time Calculation (min) (ACUTE ONLY): 37 min  Charges:  $Therapeutic Exercise: 8-22 mins $Therapeutic Activity: 8-22 mins                     Raymond Gurney, PT, DPT Acute Rehabilitation Services  Pager: [phone] Office: [phone]    Jewel Baize 01/21/2021, 12:21 PM

## 2021-01-22 ENCOUNTER — Inpatient Hospital Stay (HOSPITAL_COMMUNITY): Payer: Federal, State, Local not specified - PPO

## 2021-01-22 DIAGNOSIS — G062 Extradural and subdural abscess, unspecified: Secondary | ICD-10-CM | POA: Diagnosis not present

## 2021-01-22 DIAGNOSIS — G9341 Metabolic encephalopathy: Secondary | ICD-10-CM | POA: Diagnosis not present

## 2021-01-22 DIAGNOSIS — A419 Sepsis, unspecified organism: Secondary | ICD-10-CM | POA: Diagnosis not present

## 2021-01-22 DIAGNOSIS — I5032 Chronic diastolic (congestive) heart failure: Secondary | ICD-10-CM | POA: Diagnosis not present

## 2021-01-22 LAB — COMPREHENSIVE METABOLIC PANEL
ALT: 10 U/L (ref 0–44)
AST: 14 U/L — ABNORMAL LOW (ref 15–41)
Albumin: 2.5 g/dL — ABNORMAL LOW (ref 3.5–5.0)
Alkaline Phosphatase: 74 U/L (ref 38–126)
Anion gap: 8 (ref 5–15)
BUN: 7 mg/dL (ref 6–20)
CO2: 26 mmol/L (ref 22–32)
Calcium: 8.3 mg/dL — ABNORMAL LOW (ref 8.9–10.3)
Chloride: 98 mmol/L (ref 98–111)
Creatinine, Ser: 0.44 mg/dL — ABNORMAL LOW (ref 0.61–1.24)
GFR, Estimated: >60 mL/min (ref >60)
Glucose, Bld: 134 mg/dL — ABNORMAL HIGH (ref 70–99)
Potassium: 3.6 mmol/L (ref 3.5–5.1)
Sodium: 132 mmol/L — ABNORMAL LOW (ref 135–145)
Total Bilirubin: 0.9 mg/dL (ref 0.3–1.2)
Total Protein: 6.1 g/dL — ABNORMAL LOW (ref 6.5–8.1)

## 2021-01-22 LAB — CBC WITH DIFFERENTIAL/PLATELET
Abs Immature Granulocytes: 0.05 10*3/uL (ref 0.00–0.07)
Basophils Absolute: 0.1 10*3/uL (ref 0.0–0.1)
Basophils Relative: 0 %
Eosinophils Absolute: 0.1 10*3/uL (ref 0.0–0.5)
Eosinophils Relative: 1 %
HCT: 26.8 % — ABNORMAL LOW (ref 39.0–52.0)
Hemoglobin: 8.8 g/dL — ABNORMAL LOW (ref 13.0–17.0)
Immature Granulocytes: 0 %
Lymphocytes Relative: 13 %
Lymphs Abs: 1.6 10*3/uL (ref 0.7–4.0)
MCH: 28.5 pg (ref 26.0–34.0)
MCHC: 32.8 g/dL (ref 30.0–36.0)
MCV: 86.7 fL (ref 80.0–100.0)
Monocytes Absolute: 0.9 10*3/uL (ref 0.1–1.0)
Monocytes Relative: 8 %
Neutro Abs: 9 10*3/uL — ABNORMAL HIGH (ref 1.7–7.7)
Neutrophils Relative %: 78 %
Platelets: 463 10*3/uL — ABNORMAL HIGH (ref 150–400)
RBC: 3.09 MIL/uL — ABNORMAL LOW (ref 4.22–5.81)
RDW: 15.8 % — ABNORMAL HIGH (ref 11.5–15.5)
WBC: 11.8 10*3/uL — ABNORMAL HIGH (ref 4.0–10.5)
nRBC: 0 % (ref 0.0–0.2)

## 2021-01-22 LAB — CULTURE, BLOOD (ROUTINE X 2)
Culture: NO GROWTH
Culture: NO GROWTH
Special Requests: ADEQUATE
Special Requests: ADEQUATE

## 2021-01-22 LAB — GLUCOSE, CAPILLARY
Glucose-Capillary: 87 mg/dL (ref 70–99)
Glucose-Capillary: 163 mg/dL — ABNORMAL HIGH (ref 70–99)
Glucose-Capillary: 145 mg/dL — ABNORMAL HIGH (ref 70–99)
Glucose-Capillary: 97 mg/dL (ref 70–99)
Glucose-Capillary: 121 mg/dL — ABNORMAL HIGH (ref 70–99)
Glucose-Capillary: 152 mg/dL — ABNORMAL HIGH (ref 70–99)

## 2021-01-22 LAB — C-REACTIVE PROTEIN: CRP: 7.2 mg/dL — ABNORMAL HIGH (ref <1.0)

## 2021-01-22 MED ORDER — FREE WATER
30.0000 mL | Status: DC
  Administered 2021-01-22 – 2021-01-24 (×5): 30 mL

## 2021-01-22 MED ORDER — INSULIN GLARGINE 100 UNIT/ML ~~LOC~~ SOLN
8.0000 [IU] | Freq: Two times a day (BID) | SUBCUTANEOUS | Status: DC
  Administered 2021-01-22: 8 [IU] via SUBCUTANEOUS
  Filled 2021-01-22 (×3): qty 0.08

## 2021-01-22 MED ORDER — GLUCERNA 1.5 CAL PO LIQD
1000.0000 mL | ORAL | Status: DC
  Administered 2021-01-22: 1000 mL
  Filled 2021-01-22: qty 1000

## 2021-01-22 MED ORDER — GLUCERNA 1.5 CAL PO LIQD
1000.0000 mL | ORAL | Status: DC
  Administered 2021-01-23 – 2021-01-25 (×4): 1000 mL
  Filled 2021-01-22 (×5): qty 1000

## 2021-01-22 NOTE — Plan of Care (Signed)

## 2021-01-22 NOTE — Progress Notes (Signed)
Pharmacy Antibiotic Note  Louis [name] is a 55 y.o. male admitted on 01/05/2021 with epidural abscess with MRSA bacteremia/lumbar osteomyelitis. Plan is to continue vancomycin/cefaroline dual therapy for 2 weeks from 7/22 prior to transitioning to vancomycin monotherapy, per ID recommendations. Pharmacy has been consulted for vancomycin dosing.  Last vancomycin AUC therapeutic.  Renal function stable, afebrile, WBC improving.  Plan: Continue vanc 1750mg  IV Q12H Teflaro 600mg  IV Q8H through 8/5 Monitor renal fxn, clinical progress, weekly vanc levels   Weight: 94.3 kg (208 lb)  Temp (24hrs), Avg:98.4 F (36.9 C), Min:97.8 F (36.6 C), Max:98.9 F (37.2 C)  Recent Labs  Lab 01/18/21 0530 01/18/21 0856 01/18/21 1957 01/19/21 0347 01/20/21 0221 01/21/21 0252 01/22/21 0502  WBC 12.8*  --   --  11.4* 9.5 12.4* 11.8*  CREATININE 0.53*  --   --  0.42* 0.52* 0.44* 0.44*  VANCOTROUGH  --   --   --  14*  --   --   --   VANCOPEAK  --  26* 31  --   --   --   --      Estimated Creatinine Clearance: 119.5 mL/min (A) (by C-G formula based on SCr of 0.44 mg/dL (L)).    No Known Allergies  Cubicin PTA >> 7/14 (initial plan through 8/10, last OPAT 7/13); 7/20 >>7/21 Ceftaroline 7/20 >> (8/5) Vanc 7/14 >>7/20; 7/21>> Fluc 7/17>> 7/23 for oral thrush   7/14 CK = 32  7/18 VP 22, VT 9 (drawn late), AUC 385 on 1500 q12 >> incr 1250 q8 7/23: VP 28, VT 21,  AUC 575 >> change to 1750/12h for AUC 536 7/26-27: VP 31, VT 14 (drawn early), AUC 523 >> con't 1750mg  q12   7/13 BCx - 3/4 MRSA 7/15 Bcx: staph aureus 7/16 wound cx - ngtd 7/17 Bcx- 3/4 MRSA 7/19 Bcx: MRSA 7/20 Bcx: staph aureus 7/22 Bcx - negative 7/24 Bcx - NGTD 7/26 Bcx - NGTD  Huma Imhoff D. 8/22, PharmD, BCPS, BCCCP 01/22/2021, 1:29 PM

## 2021-01-22 NOTE — Progress Notes (Signed)
[facility] PROGRESS NOTE    Progress Note  Louis [name]  WGY:[id number] DOB: [birth year] DOA: 01/05/2021 PCP: Bobette Mo, NP     Brief Narrative:   Louis [name] is an 55 y.o. 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 01/11/2021 TEE negative for vegetation. 01/12/2021 CT scan of the abdomen pelvis showed progressive anasarca progressive erosion of L3-L4 with increasing paraspinal inflammatory collections adjacent to L3-L5 without drainable fluid at this time. 01/14/2021 blood cultures negative till date.   Assessment/Plan:   Sepsis and secondary to discitis and epidural abscess/discitis: Sepsis resolved on 01/13/2021.   Status postlaminectomy and evacuation of abscess on 12/21/2020. MRI of the lumbar spine showed multiple fluid collections and L4-L5, neurosurgery felt no need for surgical intervention as he was not neurological compromise. Daptomycin MIC was not susceptible on lab from 01/06/2019 to my explain why he is not clearing the infection. Negative agitation TEE and TEE. On 01/08/2021 IR placed a percutaneous drain on the 5 cm L3 level subdural fluid collection. White blood cell count is improving on vancomycin and cefazolin started on 01/13/2021 Cont IV vancomycin and certraline until 01/28/2021 and  switch back to IV vancomycin alone.  We will keep in-house until 01/28/2021 and repeat MRI.  Persistent MRSA bacteremia: Blood cultures on 01/05/2021, 01/08/2019, 01/09/2021, 01/11/2021, 01/12/2021 remain positive for MRSA. Surveillance blood cultures on 01/14/2021 have been negative till date Repeated blood cultures on 01/16/2021 and 01/18/2021 have remained negative till date. ID recommended to continue IV vancomycin and ceftaroline the plan to continue IV empiric antibiotics until 01/28/2021 and then continue IV vancomycin alone.  Acute metabolic encephalopathy: Became encephalopathic, likely multifactorial in the setting of opioids, metabolic and infectious etiology.   MRI of the brain showed no acute findings. We have modified his narcotic regimen to see Neurontin all other sedatives.  Severe protein caloric malnutrition/anasarca: Prealbumin of 5.9. Patient removed core track, still with inadequate nutritional intake. Will place NG tube and start tube feedings. Probably early during the week and place core track to continue tube feedings.  Uncontrolled diabetes mellitus type 2: Off tube feedings fair control of his blood glucose will decrease long-acting insulin as he is not able to keep up with oral intake.  Mechanical fall: Patient had a fall on 12/31/2020. CBD CT scan of the head showed no acute findings.  Continue fall precautions.  Cholestasis: Reactive due to infectious trends complete metabolic panel.  GERD: Continue Protonix.  Oral thrush: Completed 7-day course of Diflucan.  Hypokalemia: Replete orally now resolved.  Hypomagnesemia: Repleted now resolved.  Thrombocytosis: Asked acting as an acute phase reactant.  Abnormal EKG: Had ST segment elevation V1 V2 without reciprocal changes cardiology was consulted and a 2D echo showed no wall motion abnormalities cardiac biomarkers remain remained flat.  Chronic diastolic heart failure: Appears anasarca on physical exam  likely due to low albumin. His nutrition has been started  on tube feedings. Slowly improving.  Hypovolemic hyponatremia: He is third spacing, his albumin is slowly started to trend down and he is currently on nutrition feeds.   DVT prophylaxis: lovenox Family Communication:none Status is: Inpatient  Remains inpatient appropriate because:Hemodynamically unstable  Dispo: The patient is from: Home              Anticipated d/c is to: CIR              Patient currently is not medically stable to d/c.   Difficult to place patient No    Code Status:     Code Status Orders  (From admission, onward)           Start     Ordered   01/06/21 0224  Full code  Continuous        01/06/21 0226           Code Status History     Date Active Date Inactive Code Status Order ID Comments User Context   12/12/2020 2253 12/26/2020 2008 Full Code [id number]  Synetta Fail, MD ED         IV Access:   Peripheral IV   Procedures and diagnostic studies:   No results found.   Medical Consultants:   None.   Subjective:    Louis [name] no complaints slightly nauseated.  Objective:    Vitals:   01/21/21 2017 01/21/21 2327 01/22/21 0355 01/22/21 0840  BP: (!) 140/91 122/80 (!) 152/68 (!) 145/86  Pulse: 97 92 95 95  Resp: 20 (!) 22 19 20   Temp: 97.8 F (36.6 C) 98.2 F (36.8 C) 98.8 F (37.1 C) 97.8 F (36.6 C)  TempSrc: Oral Oral  Oral  SpO2:    95%  Weight:       SpO2: 95 % O2 Flow Rate (L/min): 1 L/min   Intake/Output Summary (Last 24 hours) at 01/22/2021 0920 Last data filed at 01/21/2021 1500 Gross per 24 hour  Intake 4760.32 ml  Output 400 ml  Net 4360.32 ml    Filed Weights   01/09/21 1700 01/13/21 1436  Weight: 92.9 kg 94.3 kg    Exam: General exam: In no acute distress. Respiratory system: Good air movement and clear to auscultation. Cardiovascular system: S1 & S2 heard, RRR. No JVD. Gastrointestinal system: Abdomen is nondistended, soft  and nontender.  Extremities: No pedal edema. Skin: No rashes, lesions or ulcers  Data Reviewed:    Labs: Basic Metabolic Panel: Recent Labs  Lab 01/17/21 1859 01/18/21 0530 01/19/21 0347 01/20/21 0221 01/21/21 0252 01/22/21 0502  NA  --  134* 133* 131* 131* 132*  K 3.0* 2.9* 3.7 3.8 4.1 3.6  CL  --  92* 93* 90* 92* 98  CO2  --  34* 33* 33* 29 26  GLUCOSE  --  212* 261* 271* 201* 134*  BUN  --  6 7 6 7 7   CREATININE  --  0.53* 0.42* 0.52* 0.44* 0.44*  CALCIUM  --  8.2* 8.0* 8.0* 8.4* 8.3*  MG 2.1 1.9 1.9 1.9 1.8  --   PHOS 2.9 2.8 2.7 2.6 2.8  --     GFR Estimated Creatinine Clearance: 119.5 mL/min (A) (by C-G formula based on SCr of 0.44 mg/dL (L)). Liver Function Tests: Recent Labs  Lab 01/18/21 0530 01/19/21 0347 01/20/21 0221 01/21/21 0252 01/22/21 0502  AST 13* 11* 11* 12* 14*  ALT 11 10 9 9 10   ALKPHOS 64 67 66 76 74  BILITOT 0.8  0.7 0.3 0.3 0.9  PROT 5.9* 5.5* 5.9* 6.1* 6.1*  ALBUMIN 2.7* 2.5* 2.5* 2.6* 2.5*    No results for input(s): LIPASE, AMYLASE in the last 168 hours. No results for input(s): AMMONIA in the last 168 hours. Coagulation profile No results for input(s): INR, PROTIME in the last 168 hours. COVID-19 Labs  Recent Labs    01/22/21 0502  CRP 7.2*    Lab Results  Component Value Date   SARSCOV2NAA NEGATIVE 01/05/2021   SARSCOV2NAA NEGATIVE 12/12/2020    CBC: Recent Labs  Lab 01/18/21 0530 01/19/21 0347 01/20/21 0221 01/21/21 0252 01/22/21 0502  WBC 12.8* 11.4* 9.5 12.4* 11.8*  NEUTROABS 10.7* 8.3* 7.2 9.5* 9.0*  HGB 7.8* 8.0* 8.1* 8.8* 8.8*  HCT 23.6* 24.3* 24.4* 27.0* 26.8*  MCV 87.1 87.4 87.5 88.2 86.7  PLT 573* 517* 532* 539* 463*    Cardiac Enzymes: No results for input(s): CKTOTAL, CKMB, CKMBINDEX, TROPONINI in the last 168 hours. BNP (last 3 results) No results for input(s): PROBNP in the last 8760 hours. CBG: Recent Labs  Lab 01/21/21 1129 01/21/21 1528 01/21/21 2015 01/22/21 0405 01/22/21 0840   GLUCAP 251* 195* 127* 87 163*    D-Dimer: No results for input(s): DDIMER in the last 72 hours. Hgb A1c: No results for input(s): HGBA1C in the last 72 hours. Lipid Profile: No results for input(s): CHOL, HDL, LDLCALC, TRIG, CHOLHDL, LDLDIRECT in the last 72 hours. Thyroid function studies: No results for input(s): TSH, T4TOTAL, T3FREE, THYROIDAB in the last 72 hours.  Invalid input(s): FREET3 Anemia work up: No results for input(s): VITAMINB12, FOLATE, FERRITIN, TIBC, IRON, RETICCTPCT in the last 72 hours. Sepsis Labs: Recent Labs  Lab 01/19/21 0347 01/20/21 0221 01/21/21 0252 01/22/21 0502  WBC 11.4* 9.5 12.4* 11.8*    Microbiology Recent Results (from the past 240 hour(s))  Culture, blood (routine x 2)     Status: Abnormal   Collection Time: 01/12/21 11:47 AM   Specimen: BLOOD RIGHT HAND  Result Value Ref Range Status   Specimen Description BLOOD RIGHT HAND  Final   Special Requests   Final    BOTTLES DRAWN AEROBIC AND ANAEROBIC Blood Culture results may not be optimal due to an inadequate volume of blood received in culture bottles   Culture  Setup Time   Final    GRAM POSITIVE COCCI IN CLUSTERS IN BOTH AEROBIC AND ANAEROBIC BOTTLES CRITICAL VALUE NOTED.  VALUE IS CONSISTENT WITH PREVIOUSLY REPORTED AND CALLED VALUE.    Culture (A)  Final    STAPHYLOCOCCUS AUREUS SUSCEPTIBILITIES PERFORMED ON PREVIOUS CULTURE WITHIN THE LAST 5 DAYS. Performed at [facility], [address] [place], [place] [address]    Report Status 01/15/2021 FINAL  Final  Culture, blood (routine x 2)     Status: None   Collection Time: 01/12/21 11:52 AM   Specimen: BLOOD LEFT HAND  Result Value Ref Range Status   Specimen Description BLOOD LEFT HAND  Final   Special Requests   Final    BOTTLES DRAWN AEROBIC ONLY Blood Culture adequate volume   Culture   Final    NO GROWTH 5 DAYS Performed at [facility], [address] [place], [place] [address]    Report Status  01/17/2021 FINAL  Final  Culture, blood (routine x 2)     Status: None   Collection Time: 01/14/21  5:42 AM   Specimen: BLOOD RIGHT HAND  Result Value Ref Range Status   Specimen Description BLOOD RIGHT HAND  Final   Special Requests   Final    BOTTLES DRAWN AEROBIC AND ANAEROBIC Blood Culture adequate volume   Culture   Final    NO GROWTH 5 DAYS Performed at [facility], [address], [place], [place] [address]    Report Status 01/19/2021 FINAL  Final  Culture, blood (routine x 2)     Status: None   Collection Time: 01/14/21  5:42 AM   Specimen: BLOOD LEFT HAND  Result Value Ref Range Status   Specimen Description BLOOD LEFT HAND  Final   Special Requests   Final    BOTTLES DRAWN AEROBIC AND ANAEROBIC Blood Culture adequate volume   Culture   Final    NO GROWTH 5 DAYS Performed at [facility], [address] [place], [place] [address]    Report Status 01/19/2021 FINAL  Final  Culture, blood (routine x 2)     Status: None (Preliminary result)   Collection Time: 01/16/21  6:43 AM   Specimen: BLOOD RIGHT HAND  Result Value Ref Range Status   Specimen Description BLOOD RIGHT HAND  Final   Special Requests   Final    BOTTLES DRAWN AEROBIC ONLY Blood Culture adequate volume   Culture   Final    NO GROWTH 4 DAYS Performed at [facility], [address] [place], [place] [address]    Report Status PENDING  Incomplete  Culture, blood (routine x 2)     Status: None (Preliminary result)   Collection Time: 01/16/21  6:44 AM   Specimen: BLOOD LEFT HAND  Result Value Ref Range Status   Specimen Description BLOOD LEFT HAND  Final   Special Requests   Final    BOTTLES DRAWN AEROBIC AND ANAEROBIC Blood Culture adequate volume   Culture   Final    NO GROWTH 4 DAYS Performed at [facility], [address] [place], [place] [address]    Report Status PENDING  Incomplete  Culture, blood (routine x 2)     Status: None (Preliminary result)   Collection Time:  01/18/21  5:30 AM   Specimen: BLOOD RIGHT HAND  Result Value Ref Range Status   Specimen Description BLOOD RIGHT HAND  Final   Special Requests   Final    BOTTLES DRAWN AEROBIC ONLY Blood Culture adequate volume   Culture   Final    NO GROWTH 2 DAYS Performed at [facility], [address] [place], [place] [address]    Report Status PENDING  Incomplete  Culture, blood (routine x 2)     Status: None (Preliminary result)   Collection Time: 01/18/21  5:34 AM   Specimen: BLOOD LEFT HAND  Result Value Ref Range Status   Specimen Description BLOOD LEFT HAND  Final   Special Requests   Final    BOTTLES DRAWN AEROBIC ONLY Blood Culture adequate volume   Culture   Final    NO GROWTH 2 DAYS Performed at [facility], [address], [place], [place] [address]    Report Status PENDING  Incomplete     Medications:    amLODipine  5 mg Oral Daily   aspirin EC  81 mg Oral Daily   atorvastatin  40 mg Oral QHS   enoxaparin (LOVENOX) injection  40 mg Subcutaneous QHS   feeding supplement  237 mL Oral TID BM   insulin aspart  0-15 Units Subcutaneous TID WC   insulin aspart  0-5 Units  Subcutaneous QHS   insulin aspart  4 Units Subcutaneous TID WC   insulin glargine  15 Units Subcutaneous BID   losartan  25 mg Oral q morning   mouth rinse  15 mL Mouth Rinse BID   melatonin  3 mg Oral QHS   metoprolol succinate  25 mg Oral Daily   multivitamin with minerals  1 tablet Oral Daily   oxyCODONE  10 mg Oral Q12H   pantoprazole (PROTONIX) IV  40 mg Intravenous BID   psyllium  1 packet Oral Daily   venlafaxine XR  75 mg Oral q morning   Continuous Infusions:  ceFTAROline (TEFLARO) IV 600 mg (01/22/21 0831)   vancomycin 1,750 mg (01/22/21 0414)      LOS: 16 days   Marinda Elk  [facility]  01/22/2021, 9:20 AM

## 2021-01-22 NOTE — Progress Notes (Signed)
Pt NG tube noted at bedside disconnected from nares.Pt was able to take po medication [employer], no c/o pain or discomfort. Spouse notified via telephone also NP on called notified received orders not to reinsert NG tube at this time and to evaluate in the AM.

## 2021-01-23 ENCOUNTER — Inpatient Hospital Stay (HOSPITAL_COMMUNITY): Payer: Federal, State, Local not specified - PPO

## 2021-01-23 DIAGNOSIS — B9562 Methicillin resistant Staphylococcus aureus infection as the cause of diseases classified elsewhere: Secondary | ICD-10-CM | POA: Diagnosis not present

## 2021-01-23 DIAGNOSIS — R7881 Bacteremia: Secondary | ICD-10-CM | POA: Diagnosis not present

## 2021-01-23 DIAGNOSIS — M4620 Osteomyelitis of vertebra, site unspecified: Secondary | ICD-10-CM | POA: Diagnosis not present

## 2021-01-23 LAB — CULTURE, BLOOD (ROUTINE X 2)
Culture: NO GROWTH
Culture: NO GROWTH
Special Requests: ADEQUATE
Special Requests: ADEQUATE

## 2021-01-23 LAB — GLUCOSE, CAPILLARY
Glucose-Capillary: 98 mg/dL (ref 70–99)
Glucose-Capillary: 133 mg/dL — ABNORMAL HIGH (ref 70–99)
Glucose-Capillary: 183 mg/dL — ABNORMAL HIGH (ref 70–99)
Glucose-Capillary: 143 mg/dL — ABNORMAL HIGH (ref 70–99)
Glucose-Capillary: 134 mg/dL — ABNORMAL HIGH (ref 70–99)

## 2021-01-23 MED ORDER — INSULIN GLARGINE 100 UNIT/ML ~~LOC~~ SOLN
5.0000 [IU] | Freq: Every day | SUBCUTANEOUS | Status: DC
  Administered 2021-01-23 – 2021-01-28 (×6): 5 [IU] via SUBCUTANEOUS
  Filled 2021-01-23 (×6): qty 0.05

## 2021-01-23 MED ORDER — INSULIN ASPART 100 UNIT/ML IJ SOLN
0.0000 [IU] | INTRAMUSCULAR | Status: DC
  Administered 2021-01-23: 2 [IU] via SUBCUTANEOUS
  Administered 2021-01-23: 3 [IU] via SUBCUTANEOUS
  Administered 2021-01-23: 2 [IU] via SUBCUTANEOUS
  Administered 2021-01-24 (×2): 3 [IU] via SUBCUTANEOUS
  Administered 2021-01-24: 2 [IU] via SUBCUTANEOUS
  Administered 2021-01-24 (×2): 3 [IU] via SUBCUTANEOUS
  Administered 2021-01-24 – 2021-01-25 (×3): 2 [IU] via SUBCUTANEOUS
  Administered 2021-01-25 – 2021-01-26 (×4): 3 [IU] via SUBCUTANEOUS
  Administered 2021-01-26: 2 [IU] via SUBCUTANEOUS
  Administered 2021-01-26: 3 [IU] via SUBCUTANEOUS
  Administered 2021-01-26: 5 [IU] via SUBCUTANEOUS
  Administered 2021-01-26 (×2): 3 [IU] via SUBCUTANEOUS
  Administered 2021-01-27: 2 [IU] via SUBCUTANEOUS
  Administered 2021-01-27: 5 [IU] via SUBCUTANEOUS
  Administered 2021-01-27 – 2021-01-28 (×4): 3 [IU] via SUBCUTANEOUS
  Administered 2021-01-28: 8 [IU] via SUBCUTANEOUS
  Administered 2021-01-28: 3 [IU] via SUBCUTANEOUS

## 2021-01-23 MED ORDER — MELATONIN 3 MG PO TABS: 3.0000 mg | ORAL_TABLET | Freq: Every day | ORAL | Status: DC

## 2021-01-23 MED ORDER — FOLIC ACID 1 MG PO TABS
1.0000 mg | ORAL_TABLET | Freq: Every day | ORAL | Status: DC
  Administered 2021-01-23 – 2021-01-28 (×6): 1 mg via ORAL
  Filled 2021-01-23 (×6): qty 1

## 2021-01-23 MED ORDER — PANTOPRAZOLE SODIUM 40 MG PO TBEC
40.0000 mg | DELAYED_RELEASE_TABLET | Freq: Two times a day (BID) | ORAL | Status: DC
  Administered 2021-01-23 – 2021-01-28 (×10): 40 mg via ORAL
  Filled 2021-01-23 (×10): qty 1

## 2021-01-23 MED ORDER — SODIUM CHLORIDE 0.9 % IV SOLN: INTRAVENOUS | Status: AC

## 2021-01-23 MED ORDER — THIAMINE HCL 100 MG/ML IJ SOLN
500.0000 mg | Freq: Every day | INTRAVENOUS | Status: AC
  Administered 2021-01-23 – 2021-01-25 (×3): 500 mg via INTRAVENOUS
  Filled 2021-01-23 (×3): qty 5

## 2021-01-23 NOTE — Progress Notes (Signed)
[facility]  Date of Admission:  01/05/2021     CC: Mrsa bacteremia  Lines: Peripheral iv's  Abx: 7/20-c ceftaroline 7/14-20; 7/21-c vanc  7/20 dapto  Previously daptomycin  ASSESSMENT: 55 yo male recent mrsa bacteremia/epidural abscess/OM and psoas abscess s/p open debridement 6/28 and IR drainage of left psoas discharged on daptomycin, now readmitted 7/13 for worsening back pain of a few days found to have recurrent mrsa bacteremia  7/13 & 7/15 & 7/17 & 7/19 & 7/20 bcx mrsa   7/13 mri lumbar/thoracic showed several areas of fluid collection with a more loculated focus at L4-5. There is no focal LE neurologic deficit or urinary/bowel incontinence. NSG reviewed case and felt no urgent decompression at this time  7/16 IR reviewed case and percutaneously drained the 5 cm L3 level subdural fluid collection.  7/17 tte no vegetation 7/19 tee no vegetation 7/20 abd/pelv ct with contrast no occult abscess  Wbc improving on vanc, but persistent bacteremia switched to combination abx therapy 7/20 dapto/ceftaroline --> vanc/ceftaroline on 7/21 (dapto resistant on sensitivity testing 7/13)  Discussed with dr Dawley of [facility] on 7/21. Based on ct 7/20 and repeat mri spine no actual target to debride  --------------- 7/31 assessment Patient continues to do well with improved back pain and leukocytosis now for several days since switching to combination abx for mrsa Repeat bcx from 7/22, 24, and 26 remains negative  I am optimistic his infection is responding well. Hopefully can transition to monotherapy in another week    PLAN: No change in current abx plan Continue combination vanc/ceftaroline  Plan duration combination abx until 8/05, then transition back to vancomycin alone Would keep patient in house at least till then Repeat thoracolumbar mri inhouse around 8/05 Discussed with primary team      Active Problems:   CAD (coronary  artery disease)   HTN (hypertension)   DMII (diabetes mellitus, type 2) (HCC)   Vertebral osteomyelitis (HCC)   MRSA bacteremia   Epidural abscess   Normocytic anemia   Hyponatremia   Anxiety   Chronic diastolic CHF (congestive heart failure) (HCC)   Protein-calorie malnutrition, severe   Discitis   Abnormal EKG   Thrombocytosis   Hypomagnesemia   Hypokalemia   Oral thrush   GERD (gastroesophageal reflux disease)   Cholestasis   No Known Allergies  Scheduled Meds:  amLODipine  5 mg Oral Daily   aspirin EC  81 mg Oral Daily   atorvastatin  40 mg Oral QHS   enoxaparin (LOVENOX) injection  40 mg Subcutaneous QHS   feeding supplement  237 mL Oral TID BM   folic acid  1 mg Oral Daily   free water  30 mL Per Tube Q4H   insulin aspart  0-15 Units Subcutaneous Q4H   insulin aspart  4 Units Subcutaneous TID WC   insulin glargine  5 Units Subcutaneous Daily   losartan  25 mg Oral q morning   mouth rinse  15 mL Mouth Rinse BID   melatonin  3 mg Oral QHS   metoprolol succinate  25 mg Oral Daily   multivitamin with minerals  1 tablet Oral Daily   oxyCODONE  10 mg Oral Q12H   pantoprazole  40 mg Oral BID   psyllium  1 packet Oral Daily   venlafaxine XR  75 mg Oral q morning   Continuous Infusions:  sodium chloride 75 mL/hr at 01/23/21 1021   ceFTAROline (TEFLARO) IV  600 mg (01/23/21 1530)   feeding supplement (GLUCERNA 1.5 CAL) 1,000 mL (01/23/21 1334)   thiamine injection     vancomycin Stopped (01/23/21 0630)   PRN Meds:.acetaminophen **OR** [DISCONTINUED] acetaminophen, diclofenac Sodium, haloperidol lactate, labetalol, ondansetron **OR** ondansetron (ZOFRAN) IV, oxyCODONE   SUBJECTIVE: No n/v/diarrhea Back pain controlled No f/c No rash   Review of Systems: ROS All other ROS was negative, except mentioned above     OBJECTIVE: Vitals:   01/23/21 0352 01/23/21 0809 01/23/21 1132 01/23/21 1522  BP: 121/73 134/79 138/86 114/75  Pulse: 89 89 86 87  Resp: 15  20 14 20   Temp: 97.9 F (36.6 C) 97.7 F (36.5 C) 98.5 F (36.9 C) 97.7 F (36.5 C)  TempSrc: Oral Oral Oral Oral  SpO2: 99% 100% 98% 99%  Weight:       Body mass index is 29.01 kg/m.  Physical Exam General/constitutional: no distress, pleasant HEENT: Normocephalic, PER, Conj Clear, EOMI, Oropharynx clear Neck supple CV: rrr no mrg Lungs: clear to auscultation, normal respiratory effort Abd: Soft, Nontender Ext: no edema Skin: No Rash Neuro: nonfocal MSK: no peripheral joint swelling/tenderness/warmth; back spines nontender    Central line presence: no   Lab Results Lab Results  Component Value Date   WBC 11.8 (H) 01/22/2021   HGB 8.8 (L) 01/22/2021   HCT 26.8 (L) 01/22/2021   MCV 86.7 01/22/2021   PLT 463 (H) 01/22/2021    Lab Results  Component Value Date   CREATININE 0.44 (L) 01/22/2021   BUN 7 01/22/2021   NA 132 (L) 01/22/2021   K 3.6 01/22/2021   CL 98 01/22/2021   CO2 26 01/22/2021    Lab Results  Component Value Date   ALT 10 01/22/2021   AST 14 (L) 01/22/2021   ALKPHOS 74 01/22/2021   BILITOT 0.9 01/22/2021      Microbiology: Recent Results (from the past 240 hour(s))  Culture, blood (routine x 2)     Status: None   Collection Time: 01/14/21  5:42 AM   Specimen: BLOOD RIGHT HAND  Result Value Ref Range Status   Specimen Description BLOOD RIGHT HAND  Final   Special Requests   Final    BOTTLES DRAWN AEROBIC AND ANAEROBIC Blood Culture adequate volume   Culture   Final    NO GROWTH 5 DAYS Performed at [facility], [address], [place], [place]    Report Status 01/19/2021 FINAL  Final  Culture, blood (routine x 2)     Status: None   Collection Time: 01/14/21  5:42 AM   Specimen: BLOOD LEFT HAND  Result Value Ref Range Status   Specimen Description BLOOD LEFT HAND  Final   Special Requests   Final    BOTTLES DRAWN AEROBIC AND ANAEROBIC Blood Culture adequate volume   Culture   Final    NO GROWTH 5 DAYS Performed  at [facility], [address] [place], [place]    Report Status 01/19/2021 FINAL  Final  Culture, blood (routine x 2)     Status: None   Collection Time: 01/16/21  6:43 AM   Specimen: BLOOD RIGHT HAND  Result Value Ref Range Status   Specimen Description BLOOD RIGHT HAND  Final   Special Requests   Final    BOTTLES DRAWN AEROBIC ONLY Blood Culture adequate volume   Culture   Final    NO GROWTH 6 DAYS Performed at [facility], [address]  [place], [place] [address]    Report Status 01/22/2021 FINAL  Final  Culture, blood (routine x 2)     Status: None   Collection Time: 01/16/21  6:44 AM   Specimen: BLOOD LEFT HAND  Result Value Ref Range Status   Specimen Description BLOOD LEFT HAND  Final   Special Requests   Final    BOTTLES DRAWN AEROBIC AND ANAEROBIC Blood Culture adequate volume   Culture   Final    NO GROWTH 6 DAYS Performed at [facility], [address] [place], [place] [address]    Report Status 01/22/2021 FINAL  Final  Culture, blood (routine x 2)     Status: None   Collection Time: 01/18/21  5:30 AM   Specimen: BLOOD RIGHT HAND  Result Value Ref Range Status   Specimen Description BLOOD RIGHT HAND  Final   Special Requests   Final    BOTTLES DRAWN AEROBIC ONLY Blood Culture adequate volume   Culture   Final    NO GROWTH 5 DAYS Performed at [facility], [address] [place], [place] [address]    Report Status 01/23/2021 FINAL  Final  Culture, blood (routine x 2)     Status: None   Collection Time: 01/18/21  5:34 AM   Specimen: BLOOD LEFT HAND  Result Value Ref Range Status   Specimen Description BLOOD LEFT HAND  Final   Special Requests   Final    BOTTLES DRAWN AEROBIC ONLY Blood Culture adequate volume   Culture   Final    NO GROWTH 5 DAYS Performed at [facility], [address] [place], [place] [address]    Report Status 01/23/2021 FINAL  Final     Serology:   Imaging: If present, new  imagings (plain films, ct scans, and mri) have been personally visualized and interpreted; radiology reports have been reviewed. Decision making incorporated into the Impression / Recommendations.  7/13 mri thoracic lumbar Paraspinal and other soft tissues: Patient is status post left laminectomy at L3. Fluid tracks from the laminectomy site into a subcutaneous and deep soft tissue collection with peripheral enhancement measuring 4.7 x 5.0 x 1.7 cm. The ventral epidural collection extends inferiorly to the L2 level. Separate prominent ventral collection is present posterior to L4 prominently right the left. This collection measures 2.9 x 1.3 cm on the sagittal images.   Disc levels:   In addition to the epidural collections, disc disease is again noted at L4-5 and L5-S1. Central foraminal narrowing are associated.   IMPRESSION: 1. T7 laminectomy subcutaneous peripherally enhancing fluid collection at this level as well. 2. Residual posterior epidural fluid collection in the thoracic spine with similar cephalo caudad extension from T5-6-T9 but less mass effect. 3. Progressive signal abnormality and enhancement throughout the T7 and T8 vertebral bodies in the T7-8 thoracic disc consistent with disc osteomyelitis. 4. Progressive paraspinous soft tissue enhancement at T3-4 through T9-10 without drainable collection. 5. Persistent extensive ventral epidural collection compatible with abscess. 6. Status post left laminectomy at L3 with peripherally enhancing fluid collection extending from the operative site into the subcutaneous and deep tissues as described. This is concerning abscess. 7. Fluid tracks from the laminectomy site into a subdural deep soft tissue collection measuring 4.7 x 5.0 x 1.7 cm. 8. Separate ventral epidural collection at L4-5 and L5-S1 consistent with epidural abscess. 9. Progressive signal abnormality and enhancement in the L3 and L4 vertebral bodies. 10.  Although there is not definite enhancement  in the L4-5 and L5-S1 discs, progressive T2 signal and paravertebral soft tissue enhancement is highly concerning for progressive disc osteomyelitis.   7/17 tte  1. Left ventricular ejection fraction, by estimation, is 55 to 60%. The  left ventricle has normal function. The left ventricle has no regional  wall motion abnormalities. There is mild concentric left ventricular  hypertrophy.   2. Right ventricular systolic function is normal. The right ventricular  size is normal.   3. The mitral valve is grossly normal. Trivial mitral valve  regurgitation. No evidence of mitral stenosis.   4. The aortic valve is grossly normal. Aortic valve regurgitation is not  visualized. No aortic stenosis is present.   5. The inferior vena cava is normal in size with greater than 50%  respiratory variability, suggesting right atrial pressure of 3 mmHg.   7/19 tee No valvular vegetation  7/20 ct abd pelv with contrast Progressive erosive changes involving the L3 and L4 vertebral bodies in keeping with progressive changes of discitis osteomyelitis. No pathologic fracture. Increasing paraspinal inflammatory collections adjacent to L3-L5 bilaterally without discrete drainable fluid at this time.   Progressive anasarca with development of pleural effusions, mild ascites, and moderate diffuse subcutaneous body wall edema.   Aortic Atherosclerosis    Raymondo Band, MD [facility] for [facility] Disease [facility] [phone] pager    01/23/2021, 4:11 PM

## 2021-01-23 NOTE — Plan of Care (Signed)

## 2021-01-23 NOTE — Progress Notes (Signed)
[facility] PROGRESS NOTE    Progress Note  Louis [name]  POE:[id number] DOB: 02-28-1966 DOA: 01/05/2021 PCP: Bobette Mo, NP     Brief Narrative:   Louis [name] is an 55 y.o. 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  He was started empirically on antibiotics.Sepsis resolved on 01/13/2021.   Status postlaminectomy and evacuation of abscess on 12/21/2020. MRI of the lumbar spine showed multiple fluid collections and L4-L5, neurosurgery felt no need for surgical intervention as he was not neurological compromise. Daptomycin MIC was not susceptible on lab from 01/06/2019 to my explain why he is not clearing the infection. Negative agitation TEE and TEE. On 01/08/2021 IR placed a percutaneous drain on the 5 cm L3 level subdural fluid collection. White blood cell count is improving on vancomycin and cefazolin started on 01/13/2021.  He also had persistent bacteremia with Blood cultures on 01/05/2021, 01/08/2019, 01/09/2021, 01/11/2021, 01/12/2021 positive for MRSA.  Blood cultures repeated on 01/14/2021, 01/16/2021 and 01/18/2021 have remained negative till date this been on IV vancomycin and Teflaro  Pertinent events: Status postlaminectomy and evacuation of abscess on 12/21/2020 12/22/2020 IR consulted and placed a drain in the iliopsoas abscess 7/13, 7/14 and 7/17 blood cultures positive for MRSA 01/06/2021 neurosurgery was consulted and since the patient was neurologically intact they do not feel he needs emergent decompression. 01/11/2021 TEE negative for vegetation. 01/12/2021 CT scan of the abdomen pelvis showed progressive anasarca progressive erosion of  L3-L4 with increasing paraspinal inflammatory collections adjacent to L3-L5 without drainable fluid at this time. 01/14/2021 blood cultures negative till date.   Assessment/Plan:   Sepsis and secondary to discitis and epidural abscess/discitis: Cont IV vancomycin and certraline until 01/28/2021 and switch back to IV vancomycin alone.  We will keep in-house until 01/28/2021 and repeat MRI.  Persistent MRSA bacteremia: ID recommended to continue IV vancomycin and ceftaroline the plan to continue IV empiric antibiotics until 01/28/2021 and then continue IV vancomycin alone.  Acute metabolic encephalopathy: Became encephalopathic, likely multifactorial in the setting of opioids, metabolic and infectious etiology.   MRI of the brain showed no acute findings. We have modified his narcotic and sedative regimen seems to be controlled on current regimen. Question if he has underlying some degree of cognitive impairment/dementia.  Severe protein caloric malnutrition/anasarca: Prealbumin of 5.9. Patient removed core track, still with inadequate nutritional intake. NG tube and start tube feeding consult nutrition. Will need to place core track on 01/24/2021, as his oral intake continues to be poor  Uncontrolled diabetes mellitus type 2: Off tube feedings blood glucose relatively well controlled  Mechanical fall: Patient had a fall on 12/31/2020. CBD CT scan of the head showed no acute findings.  Continue fall precautions.  Cholestasis: Reactive due to infectious trends complete metabolic panel.  GERD: Continue Protonix.  Oral thrush: Completed 7-day course of Diflucan.  Hypokalemia: Replete orally now resolved.  Hypomagnesemia: Repleted now resolved.  Thrombocytosis: Asked acting as an acute phase reactant.  Abnormal EKG: Had ST segment elevation V1 V2 without reciprocal changes cardiology was consulted and a 2D echo showed no wall motion abnormalities cardiac biomarkers remain remained  flat.  Chronic diastolic heart failure: Appears anasarca on physical  exam likely due to low albumin. His nutrition has been started on tube feedings. Slowly improving.  Hypovolemic hyponatremia: He is third spacing, his albumin is slowly started to trend down and he is currently on nutrition feeds.   DVT prophylaxis: lovenox Family Communication:none Status is: Inpatient  Remains inpatient appropriate because:Hemodynamically unstable  Dispo: The patient is from: Home              Anticipated d/c is to: CIR              Patient currently is not medically stable to d/c.   Difficult to place patient No    Code Status:     Code Status Orders  (From admission, onward)           Start     Ordered   01/06/21 0224  Full code  Continuous        01/06/21 0226           Code Status History     Date Active Date Inactive Code Status Order ID Comments User Context   12/12/2020 2253 12/26/2020 2008 Full Code [id number]  Synetta FailMelvin, Alexander B, MD ED         IV Access:   Peripheral IV   Procedures and diagnostic studies:   DG Abd Portable 1V  Result Date: 01/22/2021 CLINICAL DATA:  Nasogastric tube placement EXAM: PORTABLE ABDOMEN - 1 VIEW COMPARISON:  01/17/2021 FINDINGS: Removal of feeding tube. Nasogastric tube terminates at the body of the stomach with side port in the body of the stomach. Prior median sternotomy. Mild cardiomegaly. Suspect mild interstitial edema and left base airspace disease. Non-obstructive bowel gas pattern. No free intraperitoneal air. IMPRESSION: Nasogastric tube tip at body of stomach. Electronically Signed   By: Jeronimo GreavesKyle  Talbot M.D.   On: 01/22/2021 13:48     Medical Consultants:   None.   Subjective:    [name] continues to be significantly confused.  Objective:    Vitals:   01/22/21 1946 01/22/21 2331 01/23/21 0352 01/23/21 0809  BP: 122/80 117/81 121/73 134/79  Pulse: 93 93 89 89  Resp:  19 15 20   Temp:  98.5 F (36.9 C)  97.9 F (36.6 C) 97.7 F (36.5 C)  TempSrc:  Oral Oral Oral  SpO2: 99% 100% 99% 100%  Weight:       SpO2: 100 % O2 Flow Rate (L/min): 2 L/min   Intake/Output Summary (Last 24 hours) at 01/23/2021 0921 Last data filed at 01/23/2021 0353 Gross per 24 hour  Intake 700 ml  Output 500 ml  Net 200 ml    Filed Weights   01/09/21 1700 01/13/21 1436  Weight: 92.9 kg 94.3 kg    Exam: General exam: In no acute distress. Respiratory system: Good air movement and clear to auscultation. Cardiovascular system: S1 & S2 heard, RRR. No JVD. Gastrointestinal system: Abdomen is nondistended, soft and nontender.  Extremities: No pedal edema. Skin: No rashes, lesions or ulcers   Data Reviewed:    Labs: Basic Metabolic Panel: Recent Labs  Lab 01/17/21 1859 01/18/21 0530 01/19/21 0347 01/20/21 0221 01/21/21 0252 01/22/21 0502  NA  --  134* 133* 131* 131* 132*  K 3.0* 2.9* 3.7 3.8 4.1 3.6  CL  --  92* 93* 90* 92* 98  CO2  --  34* 33* 33* 29 26  GLUCOSE  --  212* 261* 271* 201* 134*  BUN  --  6 7 6 7 7   CREATININE  --  0.53*  0.42* 0.52* 0.44* 0.44*  CALCIUM  --  8.2* 8.0* 8.0* 8.4* 8.3*  MG 2.1 1.9 1.9 1.9 1.8  --   PHOS 2.9 2.8 2.7 2.6 2.8  --     GFR Estimated Creatinine Clearance: 119.5 mL/min (A) (by C-G formula based on SCr of 0.44 mg/dL (L)). Liver Function Tests: Recent Labs  Lab 01/18/21 0530 01/19/21 0347 01/20/21 0221 01/21/21 0252 01/22/21 0502  AST 13* 11* 11* 12* 14*  ALT 11 10 9 9 10   ALKPHOS 64 67 66 76 74  BILITOT 0.8 0.7 0.3 0.3 0.9  PROT 5.9* 5.5* 5.9* 6.1* 6.1*  ALBUMIN 2.7* 2.5* 2.5* 2.6* 2.5*    No results for input(s): LIPASE, AMYLASE in the last 168 hours. No results for input(s): AMMONIA in the last 168 hours. Coagulation profile No results for input(s): INR, PROTIME in the last 168 hours. COVID-19 Labs  Recent Labs    01/22/21 0502  CRP 7.2*     Lab Results  Component Value Date   SARSCOV2NAA NEGATIVE 01/05/2021   SARSCOV2NAA  NEGATIVE 12/12/2020    CBC: Recent Labs  Lab 01/18/21 0530 01/19/21 0347 01/20/21 0221 01/21/21 0252 01/22/21 0502  WBC 12.8* 11.4* 9.5 12.4* 11.8*  NEUTROABS 10.7* 8.3* 7.2 9.5* 9.0*  HGB 7.8* 8.0* 8.1* 8.8* 8.8*  HCT 23.6* 24.3* 24.4* 27.0* 26.8*  MCV 87.1 87.4 87.5 88.2 86.7  PLT 573* 517* 532* 539* 463*    Cardiac Enzymes: No results for input(s): CKTOTAL, CKMB, CKMBINDEX, TROPONINI in the last 168 hours. BNP (last 3 results) No results for input(s): PROBNP in the last 8760 hours. CBG: Recent Labs  Lab 01/22/21 1619 01/22/21 2036 01/22/21 2329 01/23/21 0345 01/23/21 0806  GLUCAP 97 121* 152* 98 133*    D-Dimer: No results for input(s): DDIMER in the last 72 hours. Hgb A1c: No results for input(s): HGBA1C in the last 72 hours. Lipid Profile: No results for input(s): CHOL, HDL, LDLCALC, TRIG, CHOLHDL, LDLDIRECT in the last 72 hours. Thyroid function studies: No results for input(s): TSH, T4TOTAL, T3FREE, THYROIDAB in the last 72 hours.  Invalid input(s): FREET3 Anemia work up: No results for input(s): VITAMINB12, FOLATE, FERRITIN, TIBC, IRON, RETICCTPCT in the last 72 hours. Sepsis Labs: Recent Labs  Lab 01/19/21 0347 01/20/21 0221 01/21/21 0252 01/22/21 0502  WBC 11.4* 9.5 12.4* 11.8*    Microbiology Recent Results (from the past 240 hour(s))  Culture, blood (routine x 2)     Status: None   Collection Time: 01/14/21  5:42 AM   Specimen: BLOOD RIGHT HAND  Result Value Ref Range Status   Specimen Description BLOOD RIGHT HAND  Final   Special Requests   Final    BOTTLES DRAWN AEROBIC AND ANAEROBIC Blood Culture adequate volume   Culture   Final    NO GROWTH 5 DAYS Performed at [facility], [address] [place], [place]    Report Status 01/19/2021 FINAL  Final  Culture, blood (routine x 2)     Status: None   Collection Time: 01/14/21  5:42 AM   Specimen: BLOOD LEFT HAND  Result Value Ref Range Status   Specimen Description  BLOOD LEFT HAND  Final   Special Requests   Final    BOTTLES DRAWN AEROBIC AND ANAEROBIC Blood Culture adequate volume   Culture   Final    NO GROWTH 5 DAYS Performed at [facility], [address] [place], [place]    Report Status 01/19/2021 FINAL  Final  Culture, blood (routine x 2)     Status: None   Collection Time: 01/16/21  6:43 AM   Specimen: BLOOD RIGHT HAND  Result Value Ref Range Status   Specimen Description BLOOD RIGHT HAND  Final   Special Requests   Final    BOTTLES DRAWN AEROBIC ONLY Blood Culture adequate volume   Culture   Final    NO GROWTH 6 DAYS Performed at [facility], [address], [place], [place] [address]    Report Status 01/22/2021 FINAL  Final  Culture, blood (routine x 2)     Status: None   Collection Time: 01/16/21  6:44 AM   Specimen: BLOOD LEFT HAND  Result Value Ref Range Status   Specimen Description BLOOD LEFT HAND  Final   Special Requests   Final    BOTTLES DRAWN AEROBIC AND ANAEROBIC Blood Culture adequate volume   Culture   Final    NO GROWTH 6 DAYS Performed at [facility], [address] [place], [place] [address]    Report Status 01/22/2021 FINAL  Final  Culture, blood (routine x 2)     Status: None (Preliminary result)   Collection Time: 01/18/21  5:30 AM   Specimen: BLOOD RIGHT HAND  Result Value Ref Range Status   Specimen Description BLOOD RIGHT HAND  Final   Special Requests   Final    BOTTLES DRAWN AEROBIC ONLY Blood Culture adequate volume   Culture   Final    NO GROWTH 4 DAYS Performed at [facility], [address] [place], [place] [address]    Report Status PENDING  Incomplete  Culture, blood (routine x 2)     Status: None (Preliminary result)   Collection Time: 01/18/21  5:34 AM   Specimen: BLOOD LEFT HAND  Result Value Ref Range Status   Specimen Description BLOOD LEFT HAND  Final   Special Requests   Final    BOTTLES DRAWN AEROBIC ONLY Blood Culture adequate volume    Culture   Final    NO GROWTH 4 DAYS Performed at [facility], [address] [place], [place] [address]    Report Status PENDING  Incomplete     Medications:    amLODipine  5 mg Oral Daily   aspirin EC  81 mg Oral Daily   atorvastatin  40 mg Oral QHS   enoxaparin (LOVENOX) injection  40 mg Subcutaneous QHS   feeding supplement  237 mL Oral TID BM   free water  30 mL Per Tube Q4H   insulin aspart  0-15 Units Subcutaneous TID WC   insulin aspart  0-5 Units Subcutaneous QHS   insulin aspart  4 Units Subcutaneous TID WC   insulin glargine  8 Units Subcutaneous BID   losartan  25 mg Oral q morning   mouth rinse  15 mL Mouth Rinse BID   melatonin  3 mg Oral QHS   metoprolol succinate  25 mg Oral Daily   multivitamin with minerals  1 tablet Oral Daily   oxyCODONE  10 mg Oral Q12H   pantoprazole (PROTONIX) IV  40 mg Intravenous BID   psyllium  1 packet Oral Daily   venlafaxine XR  75 mg Oral q morning   Continuous Infusions:  ceFTAROline (TEFLARO) IV 600 mg (01/23/21 0812)   feeding supplement (GLUCERNA 1.5 CAL) 65 mL/hr at 01/22/21 1804   vancomycin 1,750 mg (01/23/21 0430)      LOS: 17  days   Marinda Elk  [facility]  01/23/2021, 9:21 AM

## 2021-01-24 ENCOUNTER — Inpatient Hospital Stay (HOSPITAL_COMMUNITY): Payer: Federal, State, Local not specified - PPO

## 2021-01-24 LAB — GLUCOSE, CAPILLARY
Glucose-Capillary: 140 mg/dL — ABNORMAL HIGH (ref 70–99)
Glucose-Capillary: 148 mg/dL — ABNORMAL HIGH (ref 70–99)
Glucose-Capillary: 151 mg/dL — ABNORMAL HIGH (ref 70–99)
Glucose-Capillary: 158 mg/dL — ABNORMAL HIGH (ref 70–99)
Glucose-Capillary: 171 mg/dL — ABNORMAL HIGH (ref 70–99)
Glucose-Capillary: 186 mg/dL — ABNORMAL HIGH (ref 70–99)

## 2021-01-24 LAB — VITAMIN B12: Vitamin B-12: 538 pg/mL (ref 180–914)

## 2021-01-24 MED ORDER — OXYCODONE HCL 5 MG PO TABS
5.0000 mg | ORAL_TABLET | ORAL | Status: DC | PRN
  Administered 2021-01-24 – 2021-01-27 (×12): 5 mg via ORAL
  Filled 2021-01-24 (×8): qty 1

## 2021-01-24 MED ORDER — FREE WATER
100.0000 mL | Status: DC
  Administered 2021-01-24 – 2021-01-25 (×5): 100 mL

## 2021-01-24 NOTE — Procedures (Signed)
Cortrak  Person Inserting Tube:  Candler Ginsberg C, RD Tube Type:  Cortrak - 43 inches Tube Size:  10 Tube Location:  Left nare Initial Placement:  Stomach Secured by: Bridle Technique Used to Measure Tube Placement:  Marking at nare/corner of mouth Cortrak Secured At:  74 cm  Cortrak Tube Team Note:  Consult received to place a Cortrak feeding tube.   X-ray is required, abdominal x-ray has been ordered by the Cortrak team. Please confirm tube placement before using the Cortrak tube.   If the tube becomes dislodged please keep the tube and contact the Cortrak team at [email or web address] (password [email or web address]) for replacement.  If after hours and replacement cannot be delayed, place a NG tube and confirm placement with an abdominal x-ray.    Eesha Schmaltz P., RD, LDN, CNSC See AMiON for contact information     

## 2021-01-24 NOTE — Progress Notes (Signed)
Late Entry Progress Note  Pt tolerated much longer tilt time frame at a higher degree up to ~130 lbs today.    01/24/21 1337  PT Visit Information  Last PT Received On 01/24/21  Assistance Needed +1 (+2 if not tilting)  History of Present Illness Pt is a 55 y.o. male who presented 7/13 with worsening back pain with recurrent MRSA bacteremia after discharging 7/3 from being admitted with epidural abscess, vertebral osteomyelitis L3-S1, psoas abscess, and MRSA bacteremia with s/p 12/21/20 T7-8, T12-L1, L3-4 laminectomies for evacuation of epidural abscess. 7/13 mri lumbar/thoracic showed several areas of fluid collection with a more loculated focus at L4-5. 7/20 imaging revealed progressive erosive changes involving the L3 and L4 vertebral bodies  in keeping with progressive changes of discitis osteomyelitis. PMH: CAD, hypertension, uncontrolled diabetes mellitus.  Subjective Data  Patient Stated Goal to go home  Precautions  Precautions Back  Pain Assessment  Pain Assessment Faces  Faces Pain Scale 8  Pain Location bil LE's  Pain Descriptors / Indicators Grimacing;Guarding;Cramping  Pain Intervention(s) Monitored during session  Cognition  Arousal/Alertness Awake/alert  Behavior During Therapy WFL for tasks assessed/performed  Overall Cognitive Status Impaired/Different from baseline  Orientation Level Time;Situation  Current Attention Level Sustained  Memory Decreased short-term memory  Following Commands Follows one step commands with increased time  Awareness Intellectual  Bed Mobility  Overal bed mobility Needs Assistance  Bed Mobility Rolling  Rolling Min assist  General bed mobility comments assist after tilting to reposition padding and position pt in bed.  Transfers  General transfer comment used the tilt bed to increase w/bearing/standing tolerance and reduce the amount of pain when working in standing.  Ambulation/Gait  General Gait Details deferred.  Tilt Bed  Patient  on Tilt Bed? Yes  Modified Rankin (Stroke Patients Only)  Pre-Morbid Rankin Score 0  Modified Rankin 5  Balance  Standing balance comment Used Kreg bed to work on standing in tilt.  General Comments  General comments (skin integrity, edema, etc.) Tilted pt in Kreg bed.  Tilted to 45* in 15* increments. stayed for 3 min total, pt w/bearing a ~75 lbs with BP 127/81, Rose to 55* for 4 min at 120 lbs w/bearing   BP 128/80 with pt doing 10 locking/unlocking/flexing bil knees.  3rd increase to 60* at ~130 lbs for 4 min  completing 10 locking;unlocking exercise before pt asked to stop due to leg pain.  Other Exercises  Other Exercises LE AAROM exercise in knee/hip flex/ext.  PT - End of Session  Activity Tolerance Patient limited by pain  Patient left in bed;with call bell/phone within reach;Other (comment)  Nurse Communication Mobility status   PT - Assessment/Plan  PT Plan Current plan remains appropriate  PT Visit Diagnosis Muscle weakness (generalized) (M62.81);Pain  Pain - part of body Leg  PT Frequency (ACUTE ONLY) Min 5X/week  Follow Up Recommendations CIR;Supervision/Assistance - 24 hour  PT equipment Hospital bed  AM-PAC PT "6 Clicks" Mobility Outcome Measure (Version 2)  Help needed turning from your back to your side while in a flat bed without using bedrails? 3  Help needed moving from lying on your back to sitting on the side of a flat bed without using bedrails? 1  Help needed moving to and from a bed to a chair (including a wheelchair)? 1  Help needed standing up from a chair using your arms (e.g., wheelchair or bedside chair)? 1  Help needed to walk in hospital room? 1  Help needed climbing 3-5  steps with a railing?  1  6 Click Score 8  Consider Recommendation of Discharge To: CIR/SNF/LTACH  Progressive Mobility  What is the highest level of mobility based on the progressive mobility assessment? Level 1 (Bedfast) - Unable to balance while sitting on edge of bed  Mobility Sit  up in bed/chair position for meals  PT Goal Progression  Progress towards PT goals Progressing toward goals  Acute Rehab PT Goals  PT Goal Formulation With patient/family  Time For Goal Achievement 01/27/21  Potential to Achieve Goals Fair  PT Time Calculation  PT Start Time (ACUTE ONLY) 1254  PT Stop Time (ACUTE ONLY) 1320  PT Time Calculation (min) (ACUTE ONLY) 26 min  PT General Charges  $$ ACUTE PT VISIT 1 Visit  PT Treatments  $Therapeutic Activity 23-37 mins  01/24/2021  Jacinto Halim., PT Acute Rehabilitation Services [phone]  (pager) [phone]  (office)

## 2021-01-24 NOTE — Plan of Care (Signed)

## 2021-01-24 NOTE — Progress Notes (Signed)
[facility] PROGRESS NOTE    Progress Note  Louis [name]  ZOX:[id number] DOB: 01/26/1966 DOA: 01/05/2021 PCP: Bobette MoBlackburn, Helen, NP     Brief Narrative:   Louis [name] is an 55 y.o. 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  He was started empirically on antibiotics.Sepsis resolved on 01/13/2021.   Status postlaminectomy and evacuation of abscess on 12/21/2020. MRI of the lumbar spine showed multiple fluid collections and L4-L5, neurosurgery felt no need for surgical intervention as he was not neurological compromise. Daptomycin MIC was not susceptible on lab from 01/06/2019 to my explain why he is not clearing the infection. Negative agitation TEE and TEE. On 01/08/2021 IR placed a percutaneous drain on the 5 cm L3 level subdural fluid collection. White blood cell count is improving on vancomycin and cefazolin started on 01/13/2021.  He also had persistent bacteremia with Blood cultures on 01/05/2021, 01/08/2019, 01/09/2021, 01/11/2021, 01/12/2021 positive for MRSA.  Blood cultures repeated on 01/14/2021, 01/16/2021 and 01/18/2021 have remained negative till date this been on IV vancomycin and Teflaro  Pertinent events: Status postlaminectomy and evacuation of abscess on 12/21/2020 12/22/2020 IR consulted and placed a drain in the iliopsoas abscess 7/13, 7/14 and 7/17 blood cultures positive for MRSA 01/06/2021 neurosurgery was consulted and since the patient was neurologically intact they do not feel he needs emergent decompression. 01/11/2021 TEE negative for vegetation. 01/12/2021 CT scan of the abdomen pelvis showed progressive anasarca progressive erosion of  L3-L4 with increasing paraspinal inflammatory collections adjacent to L3-L5 without drainable fluid at this time. 01/14/2021 blood cultures negative till date.   Assessment/Plan:   Sepsis and secondary to discitis and epidural abscess/discitis: Cont IV vancomycin and certraline until 01/28/2021 and switch back to IV vancomycin alone.  We will keep in-house until 01/28/2021 and repeat MRI 01/29/2019.  Persistent MRSA bacteremia: ID recommended to continue IV vancomycin and ceftaroline the plan to continue IV empiric antibiotics until 01/28/2021 and then continue IV vancomycin alone.  Acute metabolic encephalopathy: Became encephalopathic, likely multifactorial in the setting of opioids, metabolic and infectious etiology.   MRI of the brain showed no acute findings. We have modified his narcotic and sedative regimen seems to be controlled on current regimen. Question if he has underlying some degree of cognitive impairment/dementia. Careful with narcotics and sedatives will be easily oversedated.  Severe protein caloric malnutrition/anasarca: Prealbumin of 5.9. Patient removed core track, still with inadequate nutritional intake. NG tube and start tube feeding consult nutrition. Will need to place core track on 01/24/2021, as his oral intake continues to be poor  Uncontrolled diabetes mellitus type 2: Once he resumes tube feedings can restart him back on his insulin.  Mechanical fall: Patient had a fall on 12/31/2020. CBD CT scan of the head showed no acute findings.  Continue fall precautions.  Cholestasis: Reactive due to infectious trends complete metabolic panel.  GERD: Continue Protonix.  Oral thrush: Completed 7-day course of Diflucan.  Hypokalemia: Replete orally now resolved.  Hypomagnesemia: Repleted now resolved.  Thrombocytosis: Asked acting as an acute phase reactant.  Abnormal EKG: Had ST segment elevation V1 V2 without reciprocal changes cardiology was consulted and  a 2D echo showed no wall motion abnormalities  cardiac biomarkers remain remained flat.  Chronic diastolic heart failure: Appears anasarca on physical exam likely due to low albumin. His nutrition has been started on tube feedings. Slowly improving.  Hypovolemic hyponatremia: He is third spacing, his albumin is slowly started to trend down and he is currently on nutrition feeds.   DVT prophylaxis: lovenox Family Communication:none Status is: Inpatient  Remains inpatient appropriate because:Hemodynamically unstable  Dispo: The patient is from: Home              Anticipated d/c is to: [facility]              Patient currently is not medically stable to d/c.   Difficult to place patient No    Code Status:     Code Status Orders  (From admission, onward)           Start     Ordered   01/06/21 0224  Full code  Continuous        01/06/21 0226           Code Status History     Date Active Date Inactive Code Status Order ID Comments User Context   12/12/2020 2253 12/26/2020 2008 Full Code [id number]  Synetta Fail, MD ED         IV Access:   Peripheral IV   Procedures and diagnostic studies:   DG Abd Portable 1V  Result Date: 01/23/2021 CLINICAL DATA:  NG tube placement. EXAM: PORTABLE ABDOMEN - 1 VIEW COMPARISON:  Earlier today at 11:07 a.m. FINDINGS: 12:36 p.m.Nasogastric tube terminates at the gastric body with the side port in the proximal stomach. Non-obstructive bowel gas pattern. Suspect left base airspace disease. No free intraperitoneal air. IMPRESSION: Nasogastric tube terminating at the body of the stomach with the side port at the level of the proximal stomach. Electronically Signed   By: Jeronimo Greaves M.D.   On: 01/23/2021 12:51   DG Abd Portable 1V  Result Date: 01/23/2021 CLINICAL DATA:  NG tube placement EXAM: PORTABLE ABDOMEN - 1 VIEW COMPARISON:  Abdominal radiograph 01/22/2021 FINDINGS: Interval placement of a nasogastric tube with side port at  the gastroesophageal junction. Bowel gas pattern is nonobstructive. Left lung base airspace disease. IMPRESSION: Interval placement of a nasogastric tube with side port at the gastroesophageal junction. Recommend advancing approximately 6-7 cm into the stomach. Electronically Signed   By: Emmaline Kluver M.D.   On: 01/23/2021 11:30   DG Abd Portable 1V  Result Date: 01/22/2021 CLINICAL DATA:  Nasogastric tube placement EXAM: PORTABLE ABDOMEN - 1 VIEW COMPARISON:  01/17/2021 FINDINGS: Removal of feeding tube. Nasogastric tube terminates at the body of the stomach with side port in the body of the stomach. Prior median sternotomy. Mild cardiomegaly. Suspect mild interstitial edema and left base airspace disease. Non-obstructive bowel gas pattern. No free intraperitoneal air. IMPRESSION: Nasogastric tube tip at body of stomach. Electronically Signed   By: Jeronimo Greaves M.D.   On: 01/22/2021 13:48     Medical Consultants:   None.   Subjective:    Louis [name] more awake today.  Objective:    Vitals:   01/23/21 1932 01/24/21 0010 01/24/21 0411 01/24/21 0733  BP: 123/84 113/79 121/83 122/81  Pulse: 87 83 87 84  Resp: 18 17 20 20   Temp: 97.8 F (36.6 C) 98 F (36.7 C) 98.1 F (36.7 C) 98.2 F (36.8 C)  TempSrc: Oral Oral Oral Oral  SpO2: 98% 98% 97% 96%  Weight:  SpO2: 96 % O2 Flow Rate (L/min): 2 L/min   Intake/Output Summary (Last 24 hours) at 01/24/2021 1043 Last data filed at 01/24/2021 0730 Gross per 24 hour  Intake 4484.38 ml  Output 500 ml  Net 3984.38 ml    Filed Weights   01/09/21 1700 01/13/21 1436  Weight: 92.9 kg 94.3 kg    Exam: General exam: In no acute distress. Respiratory system: Good air movement and clear to auscultation. Cardiovascular system: S1 & S2 heard, RRR. No JVD. Gastrointestinal system: Abdomen is nondistended, soft and nontender.  Extremities: No pedal edema. Skin: No rashes, lesions or ulcers   Data Reviewed:    Labs: Basic  Metabolic Panel: Recent Labs  Lab 01/17/21 1859 01/18/21 0530 01/19/21 0347 01/20/21 0221 01/21/21 0252 01/22/21 0502  NA  --  134* 133* 131* 131* 132*  K 3.0* 2.9* 3.7 3.8 4.1 3.6  CL  --  92* 93* 90* 92* 98  CO2  --  34* 33* 33* 29 26  GLUCOSE  --  212* 261* 271* 201* 134*  BUN  --  6 7 6 7 7   CREATININE  --  0.53* 0.42* 0.52* 0.44* 0.44*  CALCIUM  --  8.2* 8.0* 8.0* 8.4* 8.3*  MG 2.1 1.9 1.9 1.9 1.8  --   PHOS 2.9 2.8 2.7 2.6 2.8  --     GFR Estimated Creatinine Clearance: 119.5 mL/min (A) (by C-G formula based on SCr of 0.44 mg/dL (L)). Liver Function Tests: Recent Labs  Lab 01/18/21 0530 01/19/21 0347 01/20/21 0221 01/21/21 0252 01/22/21 0502  AST 13* 11* 11* 12* 14*  ALT 11 10 9 9 10   ALKPHOS 64 67 66 76 74  BILITOT 0.8 0.7 0.3 0.3 0.9  PROT 5.9* 5.5* 5.9* 6.1* 6.1*  ALBUMIN 2.7* 2.5* 2.5* 2.6* 2.5*    No results for input(s): LIPASE, AMYLASE in the last 168 hours. No results for input(s): AMMONIA in the last 168 hours. Coagulation profile No results for input(s): INR, PROTIME in the last 168 hours. COVID-19 Labs  Recent Labs    01/22/21 0502  CRP 7.2*     Lab Results  Component Value Date   SARSCOV2NAA NEGATIVE 01/05/2021   SARSCOV2NAA NEGATIVE 12/12/2020    CBC: Recent Labs  Lab 01/18/21 0530 01/19/21 0347 01/20/21 0221 01/21/21 0252 01/22/21 0502  WBC 12.8* 11.4* 9.5 12.4* 11.8*  NEUTROABS 10.7* 8.3* 7.2 9.5* 9.0*  HGB 7.8* 8.0* 8.1* 8.8* 8.8*  HCT 23.6* 24.3* 24.4* 27.0* 26.8*  MCV 87.1 87.4 87.5 88.2 86.7  PLT 573* 517* 532* 539* 463*    Cardiac Enzymes: No results for input(s): CKTOTAL, CKMB, CKMBINDEX, TROPONINI in the last 168 hours. BNP (last 3 results) No results for input(s): PROBNP in the last 8760 hours. CBG: Recent Labs  Lab 01/23/21 1535 01/23/21 2025 01/24/21 0010 01/24/21 0409 01/24/21 0821  GLUCAP 143* 134* 158* 151* 171*    D-Dimer: No results for input(s): DDIMER in the last 72 hours. Hgb A1c: No  results for input(s): HGBA1C in the last 72 hours. Lipid Profile: No results for input(s): CHOL, HDL, LDLCALC, TRIG, CHOLHDL, LDLDIRECT in the last 72 hours. Thyroid function studies: No results for input(s): TSH, T4TOTAL, T3FREE, THYROIDAB in the last 72 hours.  Invalid input(s): FREET3 Anemia work up: Recent Labs    01/24/21 0232  VITAMINB12 538   Sepsis Labs: Recent Labs  Lab 01/19/21 0347 01/20/21 0221 01/21/21 0252 01/22/21 0502  WBC 11.4* 9.5 12.4* 11.8*    Microbiology Recent Results (from  the past 240 hour(s))  Culture, blood (routine x 2)     Status: None   Collection Time: 01/16/21  6:43 AM   Specimen: BLOOD RIGHT HAND  Result Value Ref Range Status   Specimen Description BLOOD RIGHT HAND  Final   Special Requests   Final    BOTTLES DRAWN AEROBIC ONLY Blood Culture adequate volume   Culture   Final    NO GROWTH 6 DAYS Performed at [facility], [address] [place], [place] [address]    Report Status 01/22/2021 FINAL  Final  Culture, blood (routine x 2)     Status: None   Collection Time: 01/16/21  6:44 AM   Specimen: BLOOD LEFT HAND  Result Value Ref Range Status   Specimen Description BLOOD LEFT HAND  Final   Special Requests   Final    BOTTLES DRAWN AEROBIC AND ANAEROBIC Blood Culture adequate volume   Culture   Final    NO GROWTH 6 DAYS Performed at [facility], [address] [place], [place] [address]    Report Status 01/22/2021 FINAL  Final  Culture, blood (routine x 2)     Status: None   Collection Time: 01/18/21  5:30 AM   Specimen: BLOOD RIGHT HAND  Result Value Ref Range Status   Specimen Description BLOOD RIGHT HAND  Final   Special Requests   Final    BOTTLES DRAWN AEROBIC ONLY Blood Culture adequate volume   Culture   Final    NO GROWTH 5 DAYS Performed at [facility], [address], [place], [place] [address]    Report Status 01/23/2021 FINAL  Final  Culture, blood (routine x 2)     Status: None    Collection Time: 01/18/21  5:34 AM   Specimen: BLOOD LEFT HAND  Result Value Ref Range Status   Specimen Description BLOOD LEFT HAND  Final   Special Requests   Final    BOTTLES DRAWN AEROBIC ONLY Blood Culture adequate volume   Culture   Final    NO GROWTH 5 DAYS Performed at [facility], [address], [place], [place] [address]    Report Status 01/23/2021 FINAL  Final     Medications:    amLODipine  5 mg Oral Daily   aspirin EC  81 mg Oral Daily   atorvastatin  40 mg Oral QHS   enoxaparin (LOVENOX) injection  40 mg Subcutaneous QHS   feeding supplement  237 mL Oral TID BM   folic acid  1 mg Oral Daily   free water  30 mL Per Tube Q4H   insulin aspart  0-15 Units Subcutaneous Q4H   insulin aspart  4 Units Subcutaneous TID WC   insulin glargine  5 Units Subcutaneous Daily   losartan  25 mg Oral q morning   mouth rinse  15 mL Mouth Rinse BID   melatonin  3 mg Oral QHS   metoprolol succinate  25 mg Oral Daily   multivitamin with minerals  1 tablet Oral Daily   oxyCODONE  10 mg Oral Q12H   pantoprazole  40 mg Oral BID   psyllium  1 packet Oral Daily   venlafaxine XR  75 mg Oral q morning   Continuous Infusions:  ceFTAROline (TEFLARO) IV 600 mg (01/24/21 0948)   feeding supplement (GLUCERNA 1.5 CAL) 1,000 mL (01/24/21 0424)   thiamine injection Stopped (01/23/21 1744)   vancomycin Stopped (01/24/21 0631)  LOS: 18 days   Marinda Elk  [facility]  01/24/2021, 10:43 AM

## 2021-01-24 NOTE — Progress Notes (Signed)
Nutrition Follow-up  DOCUMENTATION CODES:   Severe malnutrition in context of acute illness/injury  INTERVENTION:   - PO intake has not improved. Pt has pulled out multiple NG tubes. Recommend PEG tube placement if within pt's GOC.  Continue tube feeds via Cortrak: - Glucerna 1.5 @ 65 ml/hr (1560 ml/day) - Free water flushes of 100 ml q 4 hours   Tube feeding regimen provides 2340 kcal, 129 grams of protein, and 1184 ml of H2O.  Total free water with flushes: 1784 ml  - Encourage PO intake   - Continue MVI with minerals daily   - Continue to offer Ensure Enlive po TID, each supplement provides 350 kcal and 20 grams of protein   - Continue to offer Magic Cup TID with meals, each supplement provides 290 kcal and 9 grams of protein  NUTRITION DIAGNOSIS:   Severe Malnutrition related to acute illness (MRSA bacteremia) as evidenced by mild fat depletion, moderate muscle depletion, energy intake < or equal to 50% for > or equal to 5 days.  Ongoing, being addressed via TF  GOAL:   Patient will meet greater than or equal to 90% of their needs  Met via TF  MONITOR:   PO intake, Supplement acceptance, Labs, Weight trends, TF tolerance, Skin, I & O's  REASON FOR ASSESSMENT:   Consult Assessment of nutrition requirement/status  ASSESSMENT:   55 year old male who presented to the ED on 7/13 with increased back pain after thoracic and lumbar laminectomies on 12/21/20. Pt with recent admission for epidural abscess, vertebral osteomyelitis, psoas abscess, and MRSA bacteremia. PMH of CAD, T2DM, HTN, HLD, CAD s/p CABG in 2017, anxiety.  7/16 - s/p US aspiration of lumbar paraspinal collection 7/19 - s/p TEE without vegetation 7/22 - pt with N/V 7/25 - Cortrak placed (tip post-pyloric), TF initiated 7/29 - pt removed Cortrak tube 7/30 - NG tube placed at bedside, pt removed 7/31 - NG tube replaced at bedside 8/01 - Cortrak placed (tip gastric)  Spoke with pt and wife at  bedside. Pt's wife reports that yesterday pt consumed 25% of a cinnamon roll throughout the course of the day. Pt is drinking mostly diet sodas and some water. He has not been drinking Ensure supplements per wife.  Pt has pulled out both a Cortrak tube and an NG tube. Cortrak tube was replaced today and bridled. Pt's PO intake has not improved during course of admission. Recommend PEG tube placement if within pt's GOC.  Current TF: Glucerna 1.5 @ 65 ml/hr, free water flushes of 30 ml q 4 hours  Meal Completion: 0% x last 6 meals  Medications reviewed and include: Ensure Enlive TID, folic acid, SSI q 4 hours, novolog 4 units TID with meals, lantus 5 units daily, melatonin, MVI with minerals, protonix, psyllium, IV abx, IV thiamine IVF: NS @ 75 ml/hr  Labs reviewed: sodium 132, hemoglobin 8.8 CBG's: 134-183 x 24 hours  UOP: 500 ml x 12 hours I/O's: -1.5 L since admit  Diet Order:   Diet Order             Diet regular Room service appropriate? Yes; Fluid consistency: Thin  Diet effective now                   EDUCATION NEEDS:   Education needs have been addressed  Skin:  Skin Assessment: Reviewed RN Assessment  Last BM:  01/21/21 large type 7  Height:   Ht Readings from Last 1 Encounters:  12/21/20 _0  (1.803  m)    Weight:   Wt Readings from Last 1 Encounters:  01/24/21 97.4 kg    BMI:  Body mass index is 29.95 kg/m.  Estimated Nutritional Needs:   Kcal:  2300-2500  Protein:  115-130 grams  Fluid:  >/= 2.0 L    Gustavus Bryant, MS, RD, LDN Inpatient Clinical Dietitian Please see AMiON for contact information.

## 2021-01-24 NOTE — Progress Notes (Signed)
[facility]    Date of Admission:  01/05/2021      ID: Louis [name] is a 55 y.o. male with MRSA bacteremia and thoracic-lumbar epidural abscess Active Problems:   CAD (coronary artery disease)   HTN (hypertension)   DMII (diabetes mellitus, type 2) (HCC)   Vertebral osteomyelitis (HCC)   MRSA bacteremia   Epidural abscess   Normocytic anemia   Hyponatremia   Anxiety   Chronic diastolic CHF (congestive heart failure) (HCC)   Protein-calorie malnutrition, severe   Discitis   Abnormal EKG   Thrombocytosis   Hypomagnesemia   Hypokalemia   Oral thrush   GERD (gastroesophageal reflux disease)   Cholestasis    Subjective: Afebrile, tolerating iv abtx. More myalgias and weakness to left leg vs. Right leg  Medications:   amLODipine  5 mg Oral Daily   aspirin EC  81 mg Oral Daily   atorvastatin  40 mg Oral QHS   enoxaparin (LOVENOX) injection  40 mg Subcutaneous QHS   feeding supplement  237 mL Oral TID BM   folic acid  1 mg Oral Daily   free water  100 mL Per Tube Q4H   insulin aspart  0-15 Units Subcutaneous Q4H   insulin aspart  4 Units Subcutaneous TID WC   insulin glargine  5 Units Subcutaneous Daily   losartan  25 mg Oral q morning   mouth rinse  15 mL Mouth Rinse BID   melatonin  3 mg Oral QHS   metoprolol succinate  25 mg Oral Daily   multivitamin with minerals  1 tablet Oral Daily   oxyCODONE  10 mg Oral Q12H   pantoprazole  40 mg Oral BID   psyllium  1 packet Oral Daily   venlafaxine XR  75 mg Oral q morning    Objective: Vital signs in last 24 hours: Temp:  [97.8 F (36.6 C)-98.2 F (36.8 C)] 97.8 F (36.6 C) (08/01 1533) Pulse Rate:  [82-87] 82 (08/01 1533) Resp:  [17-21] 21 (08/01 1533) BP: (113-134)/(77-84) 126/82 (08/01 1533) SpO2:  [95 %-100 %] 100 % (08/01 1533) Weight:  [97.4 kg] 97.4 kg (08/01 1317) Physical Exam  Constitutional: He is oriented to person, place, and time. He appears well-developed and  well-nourished. No distress.  HENT:  Mouth/Throat: Oropharynx is clear and moist. No oropharyngeal exudate.  Cardiovascular: Normal rate, regular rhythm and normal heart sounds. Exam reveals no gallop and no friction rub.  No murmur heard.  Pulmonary/Chest: Effort normal and breath sounds normal. No respiratory distress. He has no wheezes.  Abdominal: Soft. Bowel sounds are normal. He exhibits no distension. There is no tenderness.  Lymphadenopathy:  He has no cervical adenopathy.  Neurological: He is alert and oriented to person, place, and time. Left leg strength 4/5  with trace edema  Skin: Skin is warm and dry. No rash noted. No erythema.  Psychiatric: He has a normal mood and affect. His behavior is normal.   Lab Results Recent Labs    01/22/21 0502  WBC 11.8*  HGB 8.8*  HCT 26.8*  NA 132*  K 3.6  CL 98  CO2 26  BUN 7  CREATININE 0.44*   Liver Panel Recent Labs    01/22/21 0502  PROT 6.1*  ALBUMIN 2.5*  AST 14*  ALT 10  ALKPHOS 74  BILITOT 0.9   Sedimentation Rate No results for input(s): ESRSEDRATE in the last 72 hours. C-Reactive Protein Recent Labs    01/22/21 0502  CRP 7.2*    Microbiology: 7/22 blood cx NGTD Studies/Results: DG Abd Portable 1V  Result Date: 01/24/2021 CLINICAL DATA:  Post Cortrak feeding tube placement. EXAM: PORTABLE ABDOMEN - 1 VIEW COMPARISON:  Radiographs 01/23/2021.  CT 01/12/2021. FINDINGS: 1318 hours. Tip of the feeding tube projects over the right upper quadrant of the abdomen, likely in the distal stomach or proximal duodenum. The visualized bowel gas pattern is nonobstructive. Patient is status post median sternotomy and CABG. IMPRESSION: Feeding tube tip in the distal stomach or proximal duodenum. Electronically Signed   By: Carey Bullocks M.D.   On: 01/24/2021 15:18   DG Abd Portable 1V  Result Date: 01/23/2021 CLINICAL DATA:  NG tube placement. EXAM: PORTABLE ABDOMEN - 1 VIEW COMPARISON:  Earlier today at 11:07 a.m.  FINDINGS: 12:36 p.m.Nasogastric tube terminates at the gastric body with the side port in the proximal stomach. Non-obstructive bowel gas pattern. Suspect left base airspace disease. No free intraperitoneal air. IMPRESSION: Nasogastric tube terminating at the body of the stomach with the side port at the level of the proximal stomach. Electronically Signed   By: Jeronimo Greaves M.D.   On: 01/23/2021 12:51   DG Abd Portable 1V  Result Date: 01/23/2021 CLINICAL DATA:  NG tube placement EXAM: PORTABLE ABDOMEN - 1 VIEW COMPARISON:  Abdominal radiograph 01/22/2021 FINDINGS: Interval placement of a nasogastric tube with side port at the gastroesophageal junction. Bowel gas pattern is nonobstructive. Left lung base airspace disease. IMPRESSION: Interval placement of a nasogastric tube with side port at the gastroesophageal junction. Recommend advancing approximately 6-7 cm into the stomach. Electronically Signed   By: Emmaline Kluver M.D.   On: 01/23/2021 11:30     Assessment/Plan: 55yo M with prolonged mrsa bacteremia and epidural abscess s/p decompression on 6/28 but then had recurrence of mrsa bacteremia from 7/13-7/20)   - will plan to continue with dual therapy with ceftaroline plus vancomycin x 2 wk which will end on 8/4 then convert to iv vancomycin for addn 6 wk.   Deconditioning/left leg weakness = continue with physical therapy  Severe malnutrition = continues on tube feeds, continue to promote good oral intake, though he reports no appetite.       [facility] Cell: [phone] Pager: [phone]  01/24/2021, 4:20 PM

## 2021-01-25 ENCOUNTER — Other Ambulatory Visit: Payer: Self-pay

## 2021-01-25 LAB — CBC WITH DIFFERENTIAL/PLATELET
Abs Immature Granulocytes: 0.04 10*3/uL (ref 0.00–0.07)
Basophils Absolute: 0.1 10*3/uL (ref 0.0–0.1)
Basophils Relative: 0 %
Eosinophils Absolute: 0.3 10*3/uL (ref 0.0–0.5)
Eosinophils Relative: 3 %
HCT: 23 % — ABNORMAL LOW (ref 39.0–52.0)
Hemoglobin: 7.5 g/dL — ABNORMAL LOW (ref 13.0–17.0)
Immature Granulocytes: 0 %
Lymphocytes Relative: 12 %
Lymphs Abs: 1.5 10*3/uL (ref 0.7–4.0)
MCH: 28.8 pg (ref 26.0–34.0)
MCHC: 32.6 g/dL (ref 30.0–36.0)
MCV: 88.5 fL (ref 80.0–100.0)
Monocytes Absolute: 1 10*3/uL (ref 0.1–1.0)
Monocytes Relative: 8 %
Neutro Abs: 9.4 10*3/uL — ABNORMAL HIGH (ref 1.7–7.7)
Neutrophils Relative %: 77 %
Platelets: 472 10*3/uL — ABNORMAL HIGH (ref 150–400)
RBC: 2.6 MIL/uL — ABNORMAL LOW (ref 4.22–5.81)
RDW: 16.5 % — ABNORMAL HIGH (ref 11.5–15.5)
WBC: 12.2 10*3/uL — ABNORMAL HIGH (ref 4.0–10.5)
nRBC: 0 % (ref 0.0–0.2)

## 2021-01-25 LAB — COMPREHENSIVE METABOLIC PANEL
ALT: 8 U/L (ref 0–44)
AST: 10 U/L — ABNORMAL LOW (ref 15–41)
Albumin: 2.2 g/dL — ABNORMAL LOW (ref 3.5–5.0)
Alkaline Phosphatase: 71 U/L (ref 38–126)
Anion gap: 7 (ref 5–15)
BUN: 7 mg/dL (ref 6–20)
CO2: 28 mmol/L (ref 22–32)
Calcium: 8 mg/dL — ABNORMAL LOW (ref 8.9–10.3)
Chloride: 94 mmol/L — ABNORMAL LOW (ref 98–111)
Creatinine, Ser: 0.52 mg/dL — ABNORMAL LOW (ref 0.61–1.24)
GFR, Estimated: >60 mL/min (ref >60)
Glucose, Bld: 140 mg/dL — ABNORMAL HIGH (ref 70–99)
Potassium: 3.5 mmol/L (ref 3.5–5.1)
Sodium: 129 mmol/L — ABNORMAL LOW (ref 135–145)
Total Bilirubin: 0.5 mg/dL (ref 0.3–1.2)
Total Protein: 5.5 g/dL — ABNORMAL LOW (ref 6.5–8.1)

## 2021-01-25 LAB — GLUCOSE, CAPILLARY
Glucose-Capillary: 111 mg/dL — ABNORMAL HIGH (ref 70–99)
Glucose-Capillary: 126 mg/dL — ABNORMAL HIGH (ref 70–99)
Glucose-Capillary: 136 mg/dL — ABNORMAL HIGH (ref 70–99)
Glucose-Capillary: 161 mg/dL — ABNORMAL HIGH (ref 70–99)
Glucose-Capillary: 172 mg/dL — ABNORMAL HIGH (ref 70–99)
Glucose-Capillary: 181 mg/dL — ABNORMAL HIGH (ref 70–99)

## 2021-01-25 LAB — VANCOMYCIN, TROUGH
Vancomycin Tr: 32 ug/mL (ref 15–20)
Vancomycin Tr: 18 ug/mL (ref 15–20)

## 2021-01-25 LAB — C-REACTIVE PROTEIN: CRP: 4.1 mg/dL — ABNORMAL HIGH (ref <1.0)

## 2021-01-25 LAB — PREALBUMIN: Prealbumin: 7.1 mg/dL — ABNORMAL LOW (ref 18–38)

## 2021-01-25 MED ORDER — GLUCERNA 1.5 CAL PO LIQD
1000.0000 mL | ORAL | Status: DC
  Administered 2021-01-25 – 2021-01-27 (×3): 1000 mL
  Filled 2021-01-25 (×5): qty 1000

## 2021-01-25 MED ORDER — VANCOMYCIN HCL 1500 MG/300ML IV SOLN
1500.0000 mg | Freq: Two times a day (BID) | INTRAVENOUS | Status: DC
Start: 2021-01-25 — End: 2021-01-28
  Administered 2021-01-25 – 2021-01-28 (×7): 1500 mg via INTRAVENOUS
  Filled 2021-01-25 (×9): qty 300

## 2021-01-25 MED ORDER — PROSOURCE TF PO LIQD
45.0000 mL | Freq: Two times a day (BID) | ORAL | Status: DC
  Administered 2021-01-25 – 2021-01-28 (×6): 45 mL
  Filled 2021-01-25 (×7): qty 45

## 2021-01-25 MED ORDER — FREE WATER
200.0000 mL | Status: DC
  Administered 2021-01-25 – 2021-01-28 (×18): 200 mL

## 2021-01-25 MED ORDER — DICLOFENAC SODIUM 1 % EX GEL
2.0000 g | Freq: Four times a day (QID) | CUTANEOUS | Status: DC
  Administered 2021-01-25 – 2021-01-28 (×14): 2 g via TOPICAL
  Filled 2021-01-25: qty 100

## 2021-01-25 NOTE — Progress Notes (Signed)
Pharmacy Antibiotic Note  Louis [name] is a 55 y.o. male admitted on 01/05/2021 with epidural abscess with MRSA bacteremia/lumbar osteomyelitis for Vancomycin.  Peak and trough obtained with last dose, calculated AUC is above goal  Plan: Change Vancomycin 1500 mg IV q12h   Weight: 97.4 kg (214 lb 11.7 oz)  Temp (24hrs), Avg:98.1 F (36.7 C), Min:97.8 F (36.6 C), Max:98.2 F (36.8 C)  Recent Labs  Lab 01/18/21 0856 01/18/21 1957 01/19/21 0347 01/19/21 0347 01/20/21 0221 01/21/21 0252 01/22/21 0502 01/24/21 2143 01/25/21 0247 01/25/21 0518  WBC  --   --  11.4*  --  9.5 12.4* 11.8*  --  12.2*  --   CREATININE  --   --  0.42*  --  0.52* 0.44* 0.44*  --  0.52*  --   VANCOTROUGH  --   --  14*   < >  --   --   --  32*  --  18  VANCOPEAK 26* 31  --   --   --   --   --   --   --   --    < > = values in this interval not displayed.     Estimated Creatinine Clearance: 121.2 mL/min (A) (by C-G formula based on SCr of 0.52 mg/dL (L)).    No Known Allergies  Cubicin PTA >> 7/14 (initial plan through 8/10, last OPAT 7/13); 7/20 >>7/21 Ceftaroline 7/20 >> (8/5) Vanc 7/14 >>7/20; 7/21>> Fluc 7/17>> 7/23 for oral thrush   7/14 CK = 32  7/18 VP 22, VT 9 (drawn late), AUC 385 on 1500 q12 >> incr 1250 q8 7/23: VP 28, VT 21,  AUC 575 >> change to 1750/12h for AUC 536 7/26-27: VP 31, VT 14 (drawn early), AUC 523 >> con't 1750mg  q12   7/13 BCx - 3/4 MRSA 7/15 Bcx: staph aureus 7/16 wound cx - ngtd 7/17 Bcx- 3/4 MRSA 7/19 Bcx: MRSA 7/20 Bcx: staph aureus 7/22 Bcx - negative 7/24 Bcx - NGTD 7/26 Bcx - NGTD  8/26, PharmD, BCPS  01/25/2021, 6:19 AM

## 2021-01-25 NOTE — Progress Notes (Signed)
[facility]    Date of Admission:  01/05/2021            ID: Louis [name] is a 55 y.o. male with  Active Problems:   CAD (coronary artery disease)   HTN (hypertension)   DMII (diabetes mellitus, type 2) (HCC)   Vertebral osteomyelitis (HCC)   MRSA bacteremia   Epidural abscess   Normocytic anemia   Hyponatremia   Anxiety   Chronic diastolic CHF (congestive heart failure) (HCC)   Protein-calorie malnutrition, severe   Discitis   Abnormal EKG   Thrombocytosis   Hypomagnesemia   Hypokalemia   Oral thrush   GERD (gastroesophageal reflux disease)   Cholestasis    Subjective: Remains afebrile. Noticing more bilateral thigh pain radiating down leg  Medications:   amLODipine  5 mg Oral Daily   aspirin EC  81 mg Oral Daily   atorvastatin  40 mg Oral QHS   diclofenac Sodium  2 g Topical QID   enoxaparin (LOVENOX) injection  40 mg Subcutaneous QHS   feeding supplement  237 mL Oral TID BM   feeding supplement (GLUCERNA 1.5 CAL)  1,000 mL Per Tube Q24H   feeding supplement (PROSource TF)  45 mL Per Tube BID   folic acid  1 mg Oral Daily   free water  200 mL Per Tube Q4H   insulin aspart  0-15 Units Subcutaneous Q4H   insulin aspart  4 Units Subcutaneous TID WC   insulin glargine  5 Units Subcutaneous Daily   losartan  25 mg Oral q morning   mouth rinse  15 mL Mouth Rinse BID   melatonin  3 mg Oral QHS   metoprolol succinate  25 mg Oral Daily   multivitamin with minerals  1 tablet Oral Daily   oxyCODONE  10 mg Oral Q12H   pantoprazole  40 mg Oral BID   psyllium  1 packet Oral Daily   venlafaxine XR  75 mg Oral q morning    Objective: Vital signs in last 24 hours: Temp:  [97.8 F (36.6 C)-98.2 F (36.8 C)] 98 F (36.7 C) (08/02 1700) Pulse Rate:  [83] 83 (08/02 1700) Resp:  [18-19] 19 (08/02 0300) BP: (106-138)/(72-98) 138/98 (08/02 1700) SpO2:  [98 %] 98 % (08/02 1700) Physical Exam  Constitutional: He is oriented to person, place, and  time. He appears well-developed and well-nourished. No distress.  HENT: NG/tube Mouth/Throat: Oropharynx is clear and moist. No oropharyngeal exudate.  Cardiovascular: Normal rate, regular rhythm and normal heart sounds. Exam reveals no gallop and no friction rub.  No murmur heard.  Pulmonary/Chest: Effort normal and breath sounds normal. No respiratory distress. He has no wheezes.  Abdominal: Soft. Bowel sounds are normal. He exhibits no distension. There is no tenderness.  Lymphadenopathy:  He has no cervical adenopathy.  Neurological: He is alert and oriented to person, place, and time.  Skin: Skin is warm and dry. No rash noted. No erythema.  Psychiatric: He has a normal mood and affect. His behavior is normal.    Lab Results Recent Labs    01/25/21 0247  WBC 12.2*  HGB 7.5*  HCT 23.0*  NA 129*  K 3.5  CL 94*  CO2 28  BUN 7  CREATININE 0.52*   Liver Panel Recent Labs    01/25/21 0247  PROT 5.5*  ALBUMIN 2.2*  AST 10*  ALT 8  ALKPHOS 71  BILITOT 0.5   Sedimentation Rate No results for input(s):  ESRSEDRATE in the last 72 hours. C-Reactive Protein Recent Labs    01/25/21 0247  CRP 4.1*    Microbiology: reviewed Studies/Results: DG Abd Portable 1V  Result Date: 01/24/2021 CLINICAL DATA:  Post Cortrak feeding tube placement. EXAM: PORTABLE ABDOMEN - 1 VIEW COMPARISON:  Radiographs 01/23/2021.  CT 01/12/2021. FINDINGS: 1318 hours. Tip of the feeding tube projects over the right upper quadrant of the abdomen, likely in the distal stomach or proximal duodenum. The visualized bowel gas pattern is nonobstructive. Patient is status post median sternotomy and CABG. IMPRESSION: Feeding tube tip in the distal stomach or proximal duodenum. Electronically Signed   By: Carey Bullocks M.D.   On: 01/24/2021 15:18     Assessment/Plan: 55yo with thoracolumbar epidural abscess and persistent bacteremia with disseminated disease = improving slowly. Continue with dual therapy  with vancomycin plus ceftaroline - then mono therapy with vancomycin on 8/5. Will use 6 wk post 8/5 for new end date on abtx.  Bilateral leg pain = he reports having some improvement with voltaren cream which we will start to see if any symptomatic relief  Deconditioning = continue with PT/OT  Nutritional intake = consider starting calorie count to see if need peg vs can achieve nutritional needs by mouth  [facility] Cell: [phone] Pager: [phone]  01/25/2021, 5:22 PM

## 2021-01-25 NOTE — Progress Notes (Signed)
Physical Therapy Treatment Patient Details Name: Louis [name] MRN: [id number] DOB: 01-Nov-1965 Today's Date: 01/25/2021    History of Present Illness Pt is a 55 y.o. male who presented 7/13 with worsening back pain with recurrent MRSA bacteremia after discharging 7/3 from being admitted with epidural abscess, vertebral osteomyelitis L3-S1, psoas abscess, and MRSA bacteremia with s/p 12/21/20 T7-8, T12-L1, L3-4 laminectomies for evacuation of epidural abscess. 7/13 mri lumbar/thoracic showed several areas of fluid collection with a more loculated focus at L4-5. 7/20 imaging revealed progressive erosive changes involving the L3 and L4 vertebral bodies  in keeping with progressive changes of discitis osteomyelitis. PMH: CAD, hypertension, uncontrolled diabetes mellitus.    PT Comments    Pt desiring to get up OOB today. Was able to transfer self to EOB x2 however due to being in bed for proglonged time did get dizzy requiring pt to return to bed. Pt did complete LE there ex at EOB and attempt to scoot up to [facility]. Will attempt OOB next session when PT has a 2nd person to assist due to pt's significant LE weakness. Acute PT to cont to follow.    Follow Up Recommendations  CIR;Supervision/Assistance - 24 hour     Equipment Recommendations  Hospital bed    Recommendations for Other Services Rehab consult     Precautions / Restrictions Precautions Precautions: Back Precaution Booklet Issued: No Precaution Comments: provided cues to maintain during mobility Restrictions Weight Bearing Restrictions: No    Mobility  Bed Mobility Overal bed mobility: Needs Assistance Bed Mobility: Rolling;Sidelying to Sit;Sit to Supine Rolling: Min guard Sidelying to sit: HOB elevated;Min assist Supine to sit: Mod assist;HOB elevated     General bed mobility comments: pt initiated transfer but did it quickly and then became very dizzy upon sitting up and started falling posteriorly, PT stabilized pt however pt  had to return to supine, BP 148/78, s/p 3 min rest in supine pt did attempt again with increased PT help and slower speed inwhich pt was able to sit EOB x 5 min to complete LE ther ex    Transfers Overall transfer level: Needs assistance   Transfers: Sit to/from Stand (1/2 stand to scoot up towards [facility]) Sit to Stand: Mod assist         General transfer comment: modA at trunk to assist pt with lifting bottom off bed to scoot to [facility]  Ambulation/Gait                 Stairs             Wheelchair Mobility    Modified Rankin (Stroke Patients Only)       Balance Overall balance assessment: Needs assistance Sitting-balance support: Bilateral upper extremity supported;Feet supported Sitting balance-Leahy Scale: Fair Sitting balance - Comments: initially became dizzy, did better second trial                                    Cognition Arousal/Alertness: Awake/alert Behavior During Therapy: WFL for tasks assessed/performed Overall Cognitive Status: Impaired/Different from baseline Area of Impairment: Memory;Following commands;Safety/judgement;Awareness                   Current Attention Level: Selective   Following Commands: Follows one step commands consistently Safety/Judgement: Decreased awareness of safety;Decreased awareness of deficits Awareness: Emergent   General Comments: Pt more alert and cooperative today with desire to get up. Pt initially with decreased insight  to LE weakness hwoever once working with PT pt realized and was more appropriate and aware of deficits      Exercises General Exercises - Lower Extremity Long Arc Quad: AROM;Both;10 reps;Seated (increased time required due to onset of fatigue)    General Comments        Pertinent Vitals/Pain Pain Assessment: Faces Faces Pain Scale: Hurts even more Pain Location: bil LE's with movement Pain Descriptors / Indicators: Grimacing;Guarding;Cramping    Home Living                       Prior Function            PT Goals (current goals can now be found in the care plan section) Acute Rehab PT Goals PT Goal Formulation: With patient/family Time For Goal Achievement: 01/27/21 Potential to Achieve Goals: Fair Progress towards PT goals: Progressing toward goals    Frequency    Min 5X/week      PT Plan Current plan remains appropriate    Co-evaluation              AM-PAC PT "6 Clicks" Mobility   Outcome Measure  Help needed turning from your back to your side while in a flat bed without using bedrails?: A Little Help needed moving from lying on your back to sitting on the side of a flat bed without using bedrails?: A Little Help needed moving to and from a bed to a chair (including a wheelchair)?: A Lot Help needed standing up from a chair using your arms (e.g., wheelchair or bedside chair)?: A Lot Help needed to walk in hospital room?: Total Help needed climbing 3-5 steps with a railing? : Total 6 Click Score: 12    End of Session   Activity Tolerance: Patient limited by fatigue Patient left: in bed;with call bell/phone within reach;Other (comment);with family/visitor present Nurse Communication: Mobility status PT Visit Diagnosis: Muscle weakness (generalized) (M62.81);Pain Pain - Right/Left: Left Pain - part of body: Leg     Time: 9528-4132 PT Time Calculation (min) (ACUTE ONLY): 28 min  Charges:  $Gait Training: 8-22 mins $Therapeutic Exercise: 8-22 mins                     Lewis Shock, PT, DPT Acute Rehabilitation Services Pager #: [phone] Office #: [phone]    Iona Hansen 01/25/2021, 3:19 PM

## 2021-01-25 NOTE — Progress Notes (Signed)
Vanc, [facility] of [facility] was called on the patient. MD paged  to notify.

## 2021-01-25 NOTE — Progress Notes (Signed)
Nutrition Follow-up  DOCUMENTATION CODES:   Severe malnutrition in context of acute illness/injury  INTERVENTION:   Per MD, transition to nocturnal tube feeds via Cortrak: - Glucerna 1.5 @ 90 ml/hr x 12 hours from 1800 to 0600 (total of 1080 ml) - ProSource TF 45 ml BID - Free water flushes per MD, currently 200 ml q 4 hours  Nocturnal tube feeding regimen provides 1700 kcal, 111 grams of protein, and 820 ml of H2O (meets 74% of kcal needs and 97% of protein needs).  Total free water with flushes: 2020 ml  - Continue to offer Ensure Enlive po TID, each supplement provides 350 kcal and 20 grams of protein  - Continue to offer Magic Cup TID with meals, each supplement provides 290 kcal and 9 grams of protein  - Continue MVI with minerals daily  NUTRITION DIAGNOSIS:   Severe Malnutrition related to acute illness (MRSA bacteremia) as evidenced by mild fat depletion, moderate muscle depletion, energy intake < or equal to 50% for > or equal to 5 days.  Ongoing, being addressed via TF and oral nutrition supplements  GOAL:   Patient will meet greater than or equal to 90% of their needs  Progressing  MONITOR:   PO intake, Supplement acceptance, Labs, Weight trends, TF tolerance, Skin, I & O's  REASON FOR ASSESSMENT:   Consult Assessment of nutrition requirement/status  ASSESSMENT:   55 year old male who presented to the ED on 7/13 with increased back pain after thoracic and lumbar laminectomies on 12/21/20. Pt with recent admission for epidural abscess, vertebral osteomyelitis, psoas abscess, and MRSA bacteremia. PMH of CAD, T2DM, HTN, HLD, CAD s/p CABG in 2017, anxiety.  7/16 - s/p US aspiration of lumbar paraspinal collection 7/19 - s/p TEE without vegetation 7/22 - pt with N/V 7/25 - Cortrak placed (tip post-pyloric), TF initiated 7/29 - pt removed Cortrak tube 7/30 - NG tube placed at bedside, pt removed 7/31 - NG tube replaced at bedside 8/01 - Cortrak placed (tip  gastric)  MD requested transitioning pt to nocturnal tube feeds. Spoke with pt and wife at bedside. Pt ate 1 piece of pizza yesterday and was eating a microwave mac and cheese (220 kcal, 7 grams protein) at time of RD visit. This is more PO intake than pt has been taking in recently. Pt and wife agreeable with transition to nocturnal tube feeds. RN in room and is aware of plan.  Medications reviewed and include: Ensure Enlive TID, folic acid, SSI q 4 hours, novolog 4 units TID with meals, lantus 5 units daily, melatonin, MVI with minerals daily, protonix, psyllium, IV abx  Labs reviewed: sodium 129, hemoglobin 7.5 CBG's: 140-181 x 24 hours  Diet Order:   Diet Order             Diet regular Room service appropriate? Yes; Fluid consistency: Thin  Diet effective now                   EDUCATION NEEDS:   Education needs have been addressed  Skin:  Skin Assessment: Skin Integrity Issues: Incisions: back  Last BM:  01/25/21 medium type 6  Height:   Ht Readings from Last 1 Encounters:  12/21/20 _0  (1.803 m)    Weight:   Wt Readings from Last 1 Encounters:  01/24/21 97.4 kg    BMI:  Body mass index is 29.95 kg/m.  Estimated Nutritional Needs:   Kcal:  2300-2500  Protein:  115-130 grams  Fluid:  >/= 2.0 L  Gustavus Bryant, MS, RD, LDN Inpatient Clinical Dietitian Please see AMiON for contact information.

## 2021-01-25 NOTE — Progress Notes (Signed)
[facility] PROGRESS NOTE    Progress Note  Louis [name]  WVP:[id number] DOB: [birth year] DOA: 01/05/2021 PCP: Bobette Mo, NP     Brief Narrative:   Louis [name] is an 55 y.o. 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  He was started empirically on antibiotics.Sepsis resolved on 01/13/2021.   Status postlaminectomy and evacuation of abscess on 12/21/2020. MRI of the lumbar spine showed multiple fluid collections and L4-L5, neurosurgery felt no need for surgical intervention as he was not neurological compromise. Daptomycin MIC was not susceptible on lab from 01/06/2019 to my explain why he is not clearing the infection. Negative agitation TEE and TEE. On 01/08/2021 IR placed a percutaneous drain on the 5 cm L3 level subdural fluid collection. White blood cell count is improving on vancomycin and cefazolin started on 01/13/2021.  He also had persistent bacteremia with Blood cultures on 01/05/2021, 01/08/2019, 01/09/2021, 01/11/2021, 01/12/2021 positive for MRSA.  Blood cultures repeated on 01/14/2021, 01/16/2021 and 01/18/2021 have remained negative till date this been on IV vancomycin and Teflaro  Pertinent events: Status postlaminectomy and evacuation of abscess on 12/21/2020 12/22/2020 IR consulted and placed a drain in the iliopsoas abscess 7/13, 7/14 and 7/17 blood cultures positive for MRSA 01/06/2021 neurosurgery was consulted and since the patient was neurologically intact they do not feel he needs emergent decompression. 01/11/2021 TEE negative for vegetation. 01/12/2021 CT scan of the abdomen pelvis showed progressive anasarca progressive erosion of  L3-L4 with increasing paraspinal inflammatory collections adjacent to L3-L5 without drainable fluid at this time. 01/14/2021 blood cultures negative till date.   Assessment/Plan:   Sepsis and secondary to discitis and epidural abscess/discitis: Cont IV vancomycin and certraline until 01/28/2021 and switch back to IV vancomycin alone for 6 weeks.  We will keep in-house until 01/28/2021 and repeat MRI 01/29/2019.  Persistent MRSA bacteremia: ID recommended to continue IV vancomycin and ceftaroline the plan to continue IV empiric antibiotics until 01/28/2021 and then continue IV vancomycin alone.  Acute metabolic encephalopathy: Became encephalopathic, likely multifactorial in the setting of opioids, metabolic and infectious etiology.   MRI of the brain showed no acute findings. We have modified his narcotic and sedative regimen seems to be controlled on current regimen. Question if he has underlying some degree of cognitive impairment/dementia. Careful with narcotics and sedatives will be easily oversedated. Is currently getting enteral tube feedings as he is not able to keep with his oral intake eating less than 50%, he relates he has no appetite, there is a question of his metabolic encephalopathy is contributing to this along with his infection. In order to discontinue core track he needs to be eating at least 50% of his meals.  He has had significant poor appetite throughout his hospital stay. Will switch him to nocturnal feedings  Severe protein caloric malnutrition/anasarca: Prealbumin of 5.9. Patient removed core track, still with inadequate nutritional intake. NG tube and start tube feeding consult nutrition. Will need to place core track on 01/24/2021, as his oral intake continues to be poor  Uncontrolled diabetes mellitus type 2: Once he resumes tube feedings can restart him back on his insulin.  Mechanical fall: Patient had a fall on 12/31/2020. CBD  CT scan of the head showed no acute  findings.  Continue fall precautions.  Cholestasis: Reactive due to infectious trends complete metabolic panel.  GERD: Continue Protonix.  Oral thrush: Completed 7-day course of Diflucan.  Hypokalemia: Replete orally now resolved.  Hypomagnesemia: Repleted now resolved.  Thrombocytosis: Asked acting as an acute phase reactant.  Abnormal EKG: Had ST segment elevation V1 V2 without reciprocal changes cardiology was consulted and a 2D echo showed no wall motion abnormalities cardiac biomarkers remain remained flat.  Chronic diastolic heart failure: Appears anasarca on physical exam likely due to low albumin. His nutrition has been started on tube feedings. Slowly improving.  Hypovolemic hyponatremia: He is third spacing, his albumin is slowly started to trend down and he is currently on nutrition feeds. Start free water per tube.   DVT prophylaxis: lovenox Family Communication:none Status is: Inpatient  Remains inpatient appropriate because:Hemodynamically unstable  Dispo: The patient is from: Home              Anticipated d/c is to: [facility]              Patient currently is not medically stable to d/c.   Difficult to place patient No    Code Status:     Code Status Orders  (From admission, onward)           Start     Ordered   01/06/21 0224  Full code  Continuous        01/06/21 0226           Code Status History     Date Active Date Inactive Code Status Order ID Comments User Context   12/12/2020 2253 12/26/2020 2008 Full Code [id number]  Synetta Fail, MD ED         IV Access:   Peripheral IV   Procedures and diagnostic studies:   DG Abd Portable 1V  Result Date: 01/24/2021 CLINICAL DATA:  Post Cortrak feeding tube placement. EXAM: PORTABLE ABDOMEN - 1 VIEW COMPARISON:  Radiographs 01/23/2021.  CT 01/12/2021. FINDINGS: 1318 hours. Tip of the feeding tube projects over the right upper quadrant of the abdomen, likely in the distal stomach  or proximal duodenum. The visualized bowel gas pattern is nonobstructive. Patient is status post median sternotomy and CABG. IMPRESSION: Feeding tube tip in the distal stomach or proximal duodenum. Electronically Signed   By: Carey Bullocks M.D.   On: 01/24/2021 15:18   DG Abd Portable 1V  Result Date: 01/23/2021 CLINICAL DATA:  NG tube placement. EXAM: PORTABLE ABDOMEN - 1 VIEW COMPARISON:  Earlier today at 11:07 a.m. FINDINGS: 12:36 p.m.Nasogastric tube terminates at the gastric body with the side port in the proximal stomach. Non-obstructive bowel gas pattern. Suspect left base airspace disease. No free intraperitoneal air. IMPRESSION: Nasogastric tube terminating at the body of the stomach with the side port at the level of the proximal stomach. Electronically Signed   By: Jeronimo Greaves M.D.   On: 01/23/2021 12:51   DG Abd Portable 1V  Result Date: 01/23/2021 CLINICAL DATA:  NG tube placement EXAM: PORTABLE ABDOMEN - 1 VIEW COMPARISON:  Abdominal radiograph 01/22/2021 FINDINGS: Interval placement of a nasogastric tube with side port at the gastroesophageal junction. Bowel gas pattern is nonobstructive. Left lung base airspace disease. IMPRESSION: Interval placement of a nasogastric tube with side port at the gastroesophageal junction. Recommend advancing approximately 6-7 cm into the stomach. Electronically Signed   By: Emmaline Kluver M.D.   On: 01/23/2021 11:30  Medical Consultants:   None.   Subjective:    Jori Mollony Richert no complaints pain is controlled.  Objective:    Vitals:   01/24/21 1533 01/24/21 2003 01/25/21 0300 01/25/21 0700  BP: 126/82 106/72 111/76 111/77  Pulse: 82 83    Resp: (!) 21 18 19    Temp: 97.8 F (36.6 C) 98.2 F (36.8 C) 97.9 F (36.6 C) 97.8 F (36.6 C)  TempSrc: Oral Axillary Oral Oral  SpO2: 100%     Weight:       SpO2: 100 % O2 Flow Rate (L/min): 2 L/min   Intake/Output Summary (Last 24 hours) at 01/25/2021 0912 Last data filed at 01/24/2021  1643 Gross per 24 hour  Intake 30 ml  Output 50 ml  Net -20 ml    Filed Weights   01/09/21 1700 01/13/21 1436 01/24/21 1317  Weight: 92.9 kg 94.3 kg 97.4 kg    Exam: General exam: In no acute distress. Respiratory system: Good air movement and clear to auscultation. Cardiovascular system: S1 & S2 heard, RRR. No JVD. Gastrointestinal system: Abdomen is nondistended, soft and nontender.  Extremities: No pedal edema. Skin: No rashes, lesions or ulcers   Data Reviewed:    Labs: Basic Metabolic Panel: Recent Labs  Lab 01/19/21 0347 01/20/21 0221 01/21/21 0252 01/22/21 0502 01/25/21 0247  NA 133* 131* 131* 132* 129*  K 3.7 3.8 4.1 3.6 3.5  CL 93* 90* 92* 98 94*  CO2 33* 33* 29 26 28   GLUCOSE 261* 271* 201* 134* 140*  BUN 7 6 7 7 7   CREATININE 0.42* 0.52* 0.44* 0.44* 0.52*  CALCIUM 8.0* 8.0* 8.4* 8.3* 8.0*  MG 1.9 1.9 1.8  --   --   PHOS 2.7 2.6 2.8  --   --     GFR Estimated Creatinine Clearance: 121.2 mL/min (A) (by C-G formula based on SCr of 0.52 mg/dL (L)). Liver Function Tests: Recent Labs  Lab 01/19/21 0347 01/20/21 0221 01/21/21 0252 01/22/21 0502 01/25/21 0247  AST 11* 11* 12* 14* 10*  ALT 10 9 9 10 8   ALKPHOS 67 66 76 74 71  BILITOT 0.7 0.3 0.3 0.9 0.5  PROT 5.5* 5.9* 6.1* 6.1* 5.5*  ALBUMIN 2.5* 2.5* 2.6* 2.5* 2.2*    No results for input(s): LIPASE, AMYLASE in the last 168 hours. No results for input(s): AMMONIA in the last 168 hours. Coagulation profile No results for input(s): INR, PROTIME in the last 168 hours. COVID-19 Labs  Recent Labs    01/25/21 0247  CRP 4.1*     Lab Results  Component Value Date   SARSCOV2NAA NEGATIVE 01/05/2021   SARSCOV2NAA NEGATIVE 12/12/2020    CBC: Recent Labs  Lab 01/19/21 0347 01/20/21 0221 01/21/21 0252 01/22/21 0502 01/25/21 0247  WBC 11.4* 9.5 12.4* 11.8* 12.2*  NEUTROABS 8.3* 7.2 9.5* 9.0* 9.4*  HGB 8.0* 8.1* 8.8* 8.8* 7.5*  HCT 24.3* 24.4* 27.0* 26.8* 23.0*  MCV 87.4 87.5 88.2 86.7  88.5  PLT 517* 532* 539* 463* 472*    Cardiac Enzymes: No results for input(s): CKTOTAL, CKMB, CKMBINDEX, TROPONINI in the last 168 hours. BNP (last 3 results) No results for input(s): PROBNP in the last 8760 hours. CBG: Recent Labs  Lab 01/24/21 1553 01/24/21 2000 01/25/21 0058 01/25/21 0419 01/25/21 0710  GLUCAP 148* 140* 136* 172* 161*    D-Dimer: No results for input(s): DDIMER in the last 72 hours. Hgb A1c: No results for input(s): HGBA1C in the last 72 hours. Lipid Profile: No results for input(s):  CHOL, HDL, LDLCALC, TRIG, CHOLHDL, LDLDIRECT in the last 72 hours. Thyroid function studies: No results for input(s): TSH, T4TOTAL, T3FREE, THYROIDAB in the last 72 hours.  Invalid input(s): FREET3 Anemia work up: Recent Labs    01/24/21 0232  VITAMINB12 538    Sepsis Labs: Recent Labs  Lab 01/20/21 0221 01/21/21 0252 01/22/21 0502 01/25/21 0247  WBC 9.5 12.4* 11.8* 12.2*    Microbiology Recent Results (from the past 240 hour(s))  Culture, blood (routine x 2)     Status: None   Collection Time: 01/16/21  6:43 AM   Specimen: BLOOD RIGHT HAND  Result Value Ref Range Status   Specimen Description BLOOD RIGHT HAND  Final   Special Requests   Final    BOTTLES DRAWN AEROBIC ONLY Blood Culture adequate volume   Culture   Final    NO GROWTH 6 DAYS Performed at [facility], [address] [place], [place] [address]    Report Status 01/22/2021 FINAL  Final  Culture, blood (routine x 2)     Status: None   Collection Time: 01/16/21  6:44 AM   Specimen: BLOOD LEFT HAND  Result Value Ref Range Status   Specimen Description BLOOD LEFT HAND  Final   Special Requests   Final    BOTTLES DRAWN AEROBIC AND ANAEROBIC Blood Culture adequate volume   Culture   Final    NO GROWTH 6 DAYS Performed at [facility], [address], [place], [place] [address]    Report Status 01/22/2021 FINAL  Final  Culture, blood (routine x 2)     Status: None   Collection  Time: 01/18/21  5:30 AM   Specimen: BLOOD RIGHT HAND  Result Value Ref Range Status   Specimen Description BLOOD RIGHT HAND  Final   Special Requests   Final    BOTTLES DRAWN AEROBIC ONLY Blood Culture adequate volume   Culture   Final    NO GROWTH 5 DAYS Performed at [facility], [address], [place], [place] [address]    Report Status 01/23/2021 FINAL  Final  Culture, blood (routine x 2)     Status: None   Collection Time: 01/18/21  5:34 AM   Specimen: BLOOD LEFT HAND  Result Value Ref Range Status   Specimen Description BLOOD LEFT HAND  Final   Special Requests   Final    BOTTLES DRAWN AEROBIC ONLY Blood Culture adequate volume   Culture   Final    NO GROWTH 5 DAYS Performed at [facility], [address], [place], [place] [address]    Report Status 01/23/2021 FINAL  Final     Medications:    amLODipine  5 mg Oral Daily   aspirin EC  81 mg Oral Daily   atorvastatin  40 mg Oral QHS   enoxaparin (LOVENOX) injection  40 mg Subcutaneous QHS   feeding supplement  237 mL Oral TID BM   folic acid  1 mg Oral Daily   free water  100 mL Per Tube Q4H   insulin aspart  0-15 Units Subcutaneous Q4H   insulin aspart  4 Units Subcutaneous TID WC   insulin glargine  5 Units Subcutaneous Daily   losartan  25 mg Oral q morning   mouth rinse  15 mL Mouth Rinse BID   melatonin  3 mg Oral QHS   metoprolol succinate  25 mg Oral Daily   multivitamin with minerals  1 tablet Oral Daily  oxyCODONE  10 mg Oral Q12H   pantoprazole  40 mg Oral BID   psyllium  1 packet Oral Daily   venlafaxine XR  75 mg Oral q morning   Continuous Infusions:  ceFTAROline (TEFLARO) IV 600 mg (01/25/21 0752)   feeding supplement (GLUCERNA 1.5 CAL) 1,000 mL (01/24/21 1636)   thiamine injection 500 mg (01/24/21 1102)   vancomycin 1,500 mg (01/25/21 0907)      LOS: 19 days   Marinda Elk  [facility]  01/25/2021, 9:12 AM

## 2021-01-26 ENCOUNTER — Inpatient Hospital Stay (HOSPITAL_COMMUNITY): Payer: Federal, State, Local not specified - PPO

## 2021-01-26 DIAGNOSIS — K7200 Acute and subacute hepatic failure without coma: Secondary | ICD-10-CM

## 2021-01-26 DIAGNOSIS — R6520 Severe sepsis without septic shock: Secondary | ICD-10-CM

## 2021-01-26 LAB — GLUCOSE, CAPILLARY
Glucose-Capillary: 149 mg/dL — ABNORMAL HIGH (ref 70–99)
Glucose-Capillary: 164 mg/dL — ABNORMAL HIGH (ref 70–99)
Glucose-Capillary: 181 mg/dL — ABNORMAL HIGH (ref 70–99)
Glucose-Capillary: 186 mg/dL — ABNORMAL HIGH (ref 70–99)
Glucose-Capillary: 191 mg/dL — ABNORMAL HIGH (ref 70–99)
Glucose-Capillary: 206 mg/dL — ABNORMAL HIGH (ref 70–99)
Glucose-Capillary: 99 mg/dL (ref 70–99)

## 2021-01-26 MED ORDER — FUROSEMIDE 10 MG/ML IJ SOLN
20.0000 mg | Freq: Every day | INTRAMUSCULAR | Status: DC
  Administered 2021-01-26 – 2021-01-28 (×3): 20 mg via INTRAVENOUS
  Filled 2021-01-26 (×3): qty 2

## 2021-01-26 NOTE — Procedures (Signed)
Cortrak  Person Inserting Tube:  Jesseka Drinkard, RD Tube Type:  Cortrak - 43 inches Tube Size:  10 Tube Location:  Left nare Initial Placement:  Stomach Secured by: Bridle Technique Used to Measure Tube Placement:  Marking at nare/corner of mouth Cortrak Secured At:  75 cm   Cortrak Tube Team Note:  Consult received to place a Cortrak feeding tube.   X-ray is required, abdominal x-ray has been ordered by the Cortrak team. Please confirm tube placement before using the Cortrak tube.   If the tube becomes dislodged please keep the tube and contact the Cortrak team at [email or web address] (password [email or web address]) for replacement.  If after hours and replacement cannot be delayed, place a NG tube and confirm placement with an abdominal x-ray.   Makinzi Prieur MS, RD, LDN, CNSC Clinical Nutrition Pager listed in AMION   

## 2021-01-26 NOTE — Progress Notes (Signed)
Occupational Therapy Treatment Patient Details Name: Louis [name] MRN: [id number] DOB: 07-05-1965 Today's Date: 01/26/2021    History of present illness Pt is a 55 y.o. male who presented 7/13 with worsening back pain with recurrent MRSA bacteremia after discharging 7/3 from being admitted with epidural abscess, vertebral osteomyelitis L3-S1, psoas abscess, and MRSA bacteremia with s/p 12/21/20 T7-8, T12-L1, L3-4 laminectomies for evacuation of epidural abscess. 7/13 mri lumbar/thoracic showed several areas of fluid collection with a more loculated focus at L4-5. 7/20 imaging revealed progressive erosive changes involving the L3 and L4 vertebral bodies  in keeping with progressive changes of discitis osteomyelitis. PMH: CAD, hypertension, uncontrolled diabetes mellitus.   OT comments  Patient was able to progress out of the bed and to the recliner today.  He continued to experience dizziness with side lying to sit, but the second attempt, he experienced no dizziness, and was able to transition to the recliner with Mod A.  Additional barrier of dizziness noted, and acute OT to continue efforts to maximize his functional status.  CIR has been recommended post acute for intensive rehab.    Follow Up Recommendations  CIR    Equipment Recommendations  None recommended by OT    Recommendations for Other Services      Precautions / Restrictions Precautions Precautions: Back Restrictions Weight Bearing Restrictions: No       Mobility Bed Mobility Overal bed mobility: Needs Assistance Bed Mobility: Rolling;Sidelying to Sit;Sit to Sidelying Rolling: Min guard Sidelying to sit: HOB elevated;Min assist     Sit to sidelying: Min assist   Patient Response: Restless;Impulsive;Flat affect  Transfers Overall transfer level: Needs assistance   Transfers: Sit to/from Stand;Stand Pivot Transfers Sit to Stand: Mod assist Stand pivot transfers: Mod assist      Lateral/Scoot Transfers: Min guard       Balance Overall balance assessment: Needs assistance Sitting-balance support: Bilateral upper extremity supported;Feet supported Sitting balance-Leahy Scale: Poor     Standing balance support: Bilateral upper extremity supported Standing balance-Leahy Scale: Poor Standing balance comment: needs external support due to weakness                                                                                                                  Pertinent Vitals/ Pain       Faces Pain Scale: Hurts even more Pain Location: L hip and thigh Pain Descriptors / Indicators: Grimacing;Guarding;Cramping Pain Intervention(s): Monitored during session                                                          Frequency  Min 2X/week        Progress Toward Goals  OT Goals(current goals can now be found in the care plan section)  Progress towards OT goals: Progressing toward goals  Acute Rehab OT Goals Patient Stated Goal: get  out of the bed OT Goal Formulation: With patient Time For Goal Achievement: 01/28/21 Potential to Achieve Goals: Fair  Plan Discharge plan remains appropriate    Co-evaluation                 AM-PAC OT "6 Clicks" Daily Activity     Outcome Measure   Help from another person eating meals?: A Little Help from another person taking care of personal grooming?: A Little Help from another person toileting, which includes using toliet, bedpan, or urinal?: A Lot Help from another person bathing (including washing, rinsing, drying)?: A Lot Help from another person to put on and taking off regular upper body clothing?: A Lot Help from another person to put on and taking off regular lower body clothing?: Total 6 Click Score: 13    End of Session Equipment Utilized During Treatment: Gait belt;Rolling walker  OT Visit Diagnosis: Pain;Muscle weakness (generalized) (M62.81);Dizziness and  giddiness (R42) Pain - Right/Left: Left Pain - part of body: Leg   Activity Tolerance Patient limited by pain   Patient Left in chair;with call bell/phone within reach;with chair alarm set;with family/visitor present   Nurse Communication          Time: 1350-1410 OT Time Calculation (min): 20 min  Charges: OT General Charges $OT Visit: 1 Visit OT Treatments $Therapeutic Activity: 8-22 mins  01/26/2021  Rich, OTR/L  Acute Rehabilitation Services  Office:  [phone]    Suzanna Obey 01/26/2021, 2:44 PM

## 2021-01-26 NOTE — Progress Notes (Signed)
[facility] PROGRESS NOTE    Progress Note  Louis [name]  JEH:[id number] DOB: [birth year] DOA: 01/05/2021 PCP: Bobette Mo, NP     Brief Narrative:   55 y.o. 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  Daptomycin MIC was not susceptible on lab from 01/06/2019 to my explain why he is not clearing the infection.  Negative agitation  TEE this admission  IR drained 5 mL L3 level subdural fluid collection.  White blood cell count is improving on vancomycin and cefazolin started on 01/13/2021--Persistent bacteremia with Blood cultures on 01/05/2021, 01/08/2019, 01/09/2021, 01/11/2021, 01/12/2021 positive for MRSA.   Blood cultures repeated on 01/14/2021, 01/16/2021 and 01/18/2021 have remained negative till date this been on IV vancomycin and Teflaro  Pertinent events: Status postlaminectomy and evacuation of abscess on 12/21/2020 12/22/2020 IR consulted and placed a drain in the iliopsoas abscess 7/13, 7/14 and 7/17 blood cultures positive for MRSA 01/06/2021 neurosurgery was consulted and since the patient was neurologically intact they do not feel he needs emergent decompression. 01/11/2021 TEE negative for vegetation. 01/12/2021 CT scan of the abdomen pelvis showed progressive anasarca progressive erosion of L3-L4 with increasing paraspinal inflammatory collections adjacent to L3-L5 without drainable fluid at this time. 01/14/2021 blood cultures negative till date.   Assessment/Plan:   Sepsis and secondary to  discitis and epidural abscess/discitis:  IV vancomycin and certraline until 01/28/2021 and switch back to IV vancomycin alone for 6 weeks. repeat MRI 01/29/2019.  Persistent MRSA bacteremia: ID recommended to continue IV vancomycin and ceftaroline the plan to continue IV empiric antibiotics until 01/28/2021 and then continue IV vancomycin alone.  Acute metabolic encephalopathy??  Cognitive impairment Became encephalopathic, likely multifactorial in the setting of opioids, metabolic and infectious etiology.   MRI of the brain showed no acute findings. Currently on Haldol 2 mg every 6 as needed agitation Effexor 75 mg AM   Severe protein caloric malnutrition/anasarca: Prealbumin of 5.9. Patient removed core track, several times this admission core track was initially placed on 8/1 Continue enteral feeds until eating about 50% of meals Get calorie count may need nocturnal feeds  Uncontrolled diabetes mellitus type 2: Once he resumes tube feedings can restart him back on his insulin.  Mechanical fall: Patient had a fall on 12/31/2020. CBD CT scan of the head showed no acute findings.  Continue fall precautions.  Cholestasis: Reactive due to infectious trends complete metabolic panel.  GERD: Continue Protonix.  Oral thrush: Completed 7-day course of Diflucan.  Hypokalemia: Replete orally now resolved.  Hypomagnesemia: Repleted now resolved.  Thrombocytosis: Anemia Reactive and secondary to infection Cysts  Abnormal EKG: Had ST segment elevation V1 V2 without reciprocal changes cardiology was consulted and a 2D echo showed no wall motion abnormalities cardiac biomarkers remain remained flat.  Chronic diastolic heart failure: Appears anasarca on physical exam likely due to low albumin. His nutrition has been started on tube feedings. Slowly improving.  Probable hypervolemic hyponatremia He is third spacing, his albumin is slowly started to trend down Start back Lasix 20  day  iv for now Start free water per  tube.   DVT prophylaxis: lovenox Family Communication:none Status is: Inpatient  Remains inpatient appropriate because:Hemodynamically unstable  Dispo: The patient is from: Home              Anticipated d/c is to: CIR              Patient currently is not medically stable to d/c.   Difficult to place patient No    Code Status:     Code Status Orders  (From admission, onward)           Start     Ordered   01/06/21 0224  Full code  Continuous        01/06/21 0226           Code Status History     Date Active Date Inactive Code Status Order ID Comments User Context   12/12/2020 2253 12/26/2020 2008 Full Code [id number]  Synetta Fail, MD ED         IV Access:   Peripheral IV   Procedures and diagnostic studies:   DG Abd Portable 1V  Result Date: 01/26/2021 CLINICAL DATA:  Feeding to EXAM: PORTABLE ABDOMEN - 1 VIEW COMPARISON:  01/24/2021 FINDINGS: Feeding tube tip overlies the gastric antrum/proximal duodenum. This is unchanged from prior exam. Nonobstructive bowel gas pattern. Prior median sternotomy. IMPRESSION: Feeding tube tip overlies the gastric antrum/proximal duodenum. Electronically Signed   By: Caprice Renshaw   On: 01/26/2021 13:48     Medical Consultants:   Infectious disease infectious disease   Subjective:    Doing fair no distress not really eating seems depressed no chest pain no fever core track was removed or broken by him earlier today wife is at the bedside It appears he had some leg pain but this is improved He has not had any fever diarrhea or chills We will start a calorie count today  Objective:    Vitals:   01/26/21 0455 01/26/21 0746 01/26/21 1142 01/26/21 1527  BP: (!) 144/92 (!) 151/94 103/62 127/71  Pulse: 98 98 90 84  Resp: 20 19 18  (!) 21  Temp: 98.9 F (37.2 C) 98.9 F (37.2 C) 97.8 F (36.6 C)   TempSrc: Oral Oral Oral   SpO2: 93% 98% 100% 94%  Weight:       SpO2: 94 % O2  Flow Rate (L/min): 2 L/min   Intake/Output Summary (Last 24 hours) at 01/26/2021 1534 Last data filed at 01/26/2021 0814 Gross per 24 hour  Intake 1950 ml  Output 1100 ml  Net 850 ml    Filed Weights   01/09/21 1700 01/13/21 1436 01/24/21 1317  Weight: 92.9 kg 94.3 kg 97.4 kg    Exam: General exam: In no acute distress. Respiratory system: Good air movement and clear to auscultation. Cardiovascular system: S1 & S2 heard, RRR. No JVD. Gastrointestinal system: Abdomen is nondistended, soft and nontender.  Extremities: No pedal edema. Skin: No rashes, lesions or ulcers   Data Reviewed:    Labs: Basic Metabolic Panel: Recent Labs  Lab 01/20/21 0221 01/21/21 0252 01/22/21 0502 01/25/21 0247  NA 131* 131* 132* 129*  K 3.8 4.1 3.6 3.5  CL 90* 92* 98 94*  CO2 33* 29 26 28   GLUCOSE 271* 201* 134* 140*  BUN 6 7 7 7   CREATININE 0.52* 0.44* 0.44* 0.52*  CALCIUM 8.0* 8.4* 8.3* 8.0*  MG 1.9 1.8  --   --   PHOS 2.6 2.8  --   --  GFR Estimated Creatinine Clearance: 121.2 mL/min (A) (by C-G formula based on SCr of 0.52 mg/dL (L)). Liver Function Tests: Recent Labs  Lab 01/20/21 0221 01/21/21 0252 01/22/21 0502 01/25/21 0247  AST 11* 12* 14* 10*  ALT 9 9 10 8   ALKPHOS 66 76 74 71  BILITOT 0.3 0.3 0.9 0.5  PROT 5.9* 6.1* 6.1* 5.5*  ALBUMIN 2.5* 2.6* 2.5* 2.2*    No results for input(s): LIPASE, AMYLASE in the last 168 hours. No results for input(s): AMMONIA in the last 168 hours. Coagulation profile No results for input(s): INR, PROTIME in the last 168 hours. COVID-19 Labs  Recent Labs    01/25/21 0247  CRP 4.1*     Lab Results  Component Value Date   SARSCOV2NAA NEGATIVE 01/05/2021   SARSCOV2NAA NEGATIVE 12/12/2020    CBC: Recent Labs  Lab 01/20/21 0221 01/21/21 0252 01/22/21 0502 01/25/21 0247  WBC 9.5 12.4* 11.8* 12.2*  NEUTROABS 7.2 9.5* 9.0* 9.4*  HGB 8.1* 8.8* 8.8* 7.5*  HCT 24.4* 27.0* 26.8* 23.0*  MCV 87.5 88.2 86.7 88.5  PLT 532*  539* 463* 472*    Cardiac Enzymes: No results for input(s): CKTOTAL, CKMB, CKMBINDEX, TROPONINI in the last 168 hours. BNP (last 3 results) No results for input(s): PROBNP in the last 8760 hours. CBG: Recent Labs  Lab 01/26/21 0004 01/26/21 0457 01/26/21 0745 01/26/21 1140 01/26/21 1526  GLUCAP 149* 206* 181* 164* 99    D-Dimer: No results for input(s): DDIMER in the last 72 hours. Hgb A1c: No results for input(s): HGBA1C in the last 72 hours. Lipid Profile: No results for input(s): CHOL, HDL, LDLCALC, TRIG, CHOLHDL, LDLDIRECT in the last 72 hours. Thyroid function studies: No results for input(s): TSH, T4TOTAL, T3FREE, THYROIDAB in the last 72 hours.  Invalid input(s): FREET3 Anemia work up: Recent Labs    01/24/21 0232  VITAMINB12 538    Sepsis Labs: Recent Labs  Lab 01/20/21 0221 01/21/21 0252 01/22/21 0502 01/25/21 0247  WBC 9.5 12.4* 11.8* 12.2*    Microbiology Recent Results (from the past 240 hour(s))  Culture, blood (routine x 2)     Status: None   Collection Time: 01/18/21  5:30 AM   Specimen: BLOOD RIGHT HAND  Result Value Ref Range Status   Specimen Description BLOOD RIGHT HAND  Final   Special Requests   Final    BOTTLES DRAWN AEROBIC ONLY Blood Culture adequate volume   Culture   Final    NO GROWTH 5 DAYS Performed at [facility], [address] [place], [place] [address]    Report Status 01/23/2021 FINAL  Final  Culture, blood (routine x 2)     Status: None   Collection Time: 01/18/21  5:34 AM   Specimen: BLOOD LEFT HAND  Result Value Ref Range Status   Specimen Description BLOOD LEFT HAND  Final   Special Requests   Final    BOTTLES DRAWN AEROBIC ONLY Blood Culture adequate volume   Culture   Final    NO GROWTH 5 DAYS Performed at [facility], [address] [place], [place] [address]    Report Status 01/23/2021 FINAL  Final     Medications:    amLODipine  5 mg Oral Daily   aspirin EC  81 mg Oral Daily    atorvastatin  40 mg Oral QHS   diclofenac Sodium  2 g Topical QID   enoxaparin (LOVENOX) injection  40 mg Subcutaneous QHS   feeding supplement  237  mL Oral TID BM   feeding supplement (GLUCERNA 1.5 CAL)  1,000 mL Per Tube Q24H   feeding supplement (PROSource TF)  45 mL Per Tube BID   folic acid  1 mg Oral Daily   free water  200 mL Per Tube Q4H   insulin aspart  0-15 Units Subcutaneous Q4H   insulin aspart  4 Units Subcutaneous TID WC   insulin glargine  5 Units Subcutaneous Daily   losartan  25 mg Oral q morning   mouth rinse  15 mL Mouth Rinse BID   melatonin  3 mg Oral QHS   metoprolol succinate  25 mg Oral Daily   multivitamin with minerals  1 tablet Oral Daily   oxyCODONE  10 mg Oral Q12H   pantoprazole  40 mg Oral BID   psyllium  1 packet Oral Daily   venlafaxine XR  75 mg Oral q morning   Continuous Infusions:  ceFTAROline (TEFLARO) IV 600 mg (01/26/21 0851)   vancomycin 1,500 mg (01/26/21 0851)      LOS: 20 days   Louis [name]  [facility]  01/26/2021, 3:34 PM

## 2021-01-26 NOTE — Progress Notes (Signed)
Physical Therapy Treatment Patient Details Name: Louis [name] MRN: [id number] DOB: 06-03-1966 Today's Date: 01/26/2021    History of Present Illness Pt is a 55 y.o. male who presented 7/13 with worsening back pain with recurrent MRSA bacteremia after discharging 7/3 from being admitted with epidural abscess, vertebral osteomyelitis L3-S1, psoas abscess, and MRSA bacteremia with s/p 12/21/20 T7-8, T12-L1, L3-4 laminectomies for evacuation of epidural abscess. 7/13 mri lumbar/thoracic showed several areas of fluid collection with a more loculated focus at L4-5. 7/20 imaging revealed progressive erosive changes involving the L3 and L4 vertebral bodies  in keeping with progressive changes of discitis osteomyelitis. PMH: CAD, hypertension, uncontrolled diabetes mellitus.    PT Comments    Pt motivated to participate and improve, willing to try to get OOB to recliner this date. However, pt limited in ability to tolerate sitting up long enough to transfer by increased dizziness. Pt was dizzy upon arrival, but it increased with his transition supine > sit EOB with minA. His BP did not appear to decrease, but was unable to take BP quick enough while still sitting up EOB, see General Comments below. Pt deferred further exercises, functional mobility, or use of tilt bed due to increased dizziness and feeling unwell, RN made aware. Will continue to follow acutely. Current recommendations remain appropriate.    Follow Up Recommendations  CIR;Supervision/Assistance - 24 hour     Equipment Recommendations  Hospital bed    Recommendations for Other Services Rehab consult     Precautions / Restrictions Precautions Precautions: Back Precaution Booklet Issued: No Precaution Comments: provided cues to maintain during mobility Restrictions Weight Bearing Restrictions: No    Mobility  Bed Mobility Overal bed mobility: Needs Assistance Bed Mobility: Rolling;Sidelying to Sit;Sit to Supine Rolling: Min  guard Sidelying to sit: HOB elevated;Min assist Supine to sit: Min assist;HOB elevated     General bed mobility comments: Cues to roll with [place] of bed rail, extra time and min guard assist. MinA to complete pt's quick initiation to sit up EOB with HOB elevated. However, pt began to look unstable and reported increased dizziness, quickly returning himself to supine after a few seconds sitting EOB, minA to manage legs.    Transfers                 General transfer comment: Unable to tolerate sitting up long enough for planned pivot to chair.  Ambulation/Gait         Gait velocity: Unable to tolerate sitting up long enough to stand this date.       Stairs             Wheelchair Mobility    Modified Rankin (Stroke Patients Only) Modified Rankin (Stroke Patients Only) Pre-Morbid Rankin Score: No symptoms Modified Rankin: Severe disability     Balance Overall balance assessment: Needs assistance Sitting-balance support: Bilateral upper extremity supported;Feet supported Sitting balance-Leahy Scale: Poor Sitting balance - Comments: Pt sitting up EOB a few seconds before returning self to supine due to dizziness, bil UE support with min guard-minA.                                    Cognition Arousal/Alertness: Awake/alert Behavior During Therapy: WFL for tasks assessed/performed Overall Cognitive Status: Impaired/Different from baseline Area of Impairment: Memory;Following commands;Safety/judgement;Awareness;Attention;Orientation                   Current Attention Level: Selective Memory:  Decreased recall of precautions;Decreased short-term memory Following Commands: Follows one step commands consistently Safety/Judgement: Decreased awareness of safety;Decreased awareness of deficits Awareness: Emergent   General Comments: Pt more alert and cooperative today with desire to get up, but intermittently would need stimulation to open eyes  or stay on task again. Pt easily distracted by pain and dizziness. Able to state it was the "3rd" but then when asked which month it was he stated "August, September, October, I don't know"      Exercises      General Comments General comments (skin integrity, edema, etc.): Pt dizzy upon arrival, with increased dizziness sitting up EOB, BP supine start of session 141/90, BP supine after sitting up briefly 143/100, BP supine end of session 138/96; pt deferred further mobility, exercises, or use of tilt bed following transition supine <> sit due to dizziness, RN made aware      Pertinent Vitals/Pain Pain Assessment: Faces Faces Pain Scale: Hurts little more Pain Location: bil LE's with movement Pain Descriptors / Indicators: Grimacing;Guarding;Cramping Pain Intervention(s): Limited activity within patient's tolerance;Monitored during session;Repositioned    Home Living                      Prior Function            PT Goals (current goals can now be found in the care plan section) Acute Rehab PT Goals Patient Stated Goal: to not be dizzy PT Goal Formulation: With patient Time For Goal Achievement: 01/27/21 Potential to Achieve Goals: Fair Progress towards PT goals: Progressing toward goals    Frequency    Min 5X/week      PT Plan Current plan remains appropriate    Co-evaluation              AM-PAC PT "6 Clicks" Mobility   Outcome Measure  Help needed turning from your back to your side while in a flat bed without using bedrails?: A Little Help needed moving from lying on your back to sitting on the side of a flat bed without using bedrails?: A Little Help needed moving to and from a bed to a chair (including a wheelchair)?: A Lot Help needed standing up from a chair using your arms (e.g., wheelchair or bedside chair)?: A Lot Help needed to walk in hospital room?: Total Help needed climbing 3-5 steps with a railing? : Total 6 Click Score: 12    End  of Session Equipment Utilized During Treatment: Oxygen Activity Tolerance: Patient limited by fatigue;Other (comment) (limited by dizziness) Patient left: in bed;with call bell/phone within reach Nurse Communication: Mobility status;Other (comment) (BP and dizziness) PT Visit Diagnosis: Muscle weakness (generalized) (M62.81);Pain Pain - Right/Left: Left Pain - part of body: Leg     Time: 0912-0934 PT Time Calculation (min) (ACUTE ONLY): 22 min  Charges:  $Therapeutic Activity: 8-22 mins                     Raymond Gurney, PT, DPT Acute Rehabilitation Services  Pager: [phone] Office: [phone]    Jewel Baize 01/26/2021, 10:43 AM

## 2021-01-26 NOTE — Progress Notes (Signed)
PHARMACY CONSULT NOTE FOR:  OUTPATIENT  PARENTERAL ANTIBIOTIC THERAPY (OPAT)  Indication: MRSA Bacteremia + discitis  Regimen: Vancomycin 1500 mg IV q12h End date: 03/10/21  IV antibiotic discharge orders are pended. To discharging provider:  please sign these orders via discharge navigator,  Select New Orders & click on the button choice - Manage This Unsigned Work.     Thank you for allowing pharmacy to be a part of this patient's care.  Georgeann Oppenheim 01/26/2021, 10:15 AM

## 2021-01-27 ENCOUNTER — Inpatient Hospital Stay (HOSPITAL_COMMUNITY): Payer: Federal, State, Local not specified - PPO

## 2021-01-27 ENCOUNTER — Telehealth: Payer: Self-pay

## 2021-01-27 LAB — CBC WITH DIFFERENTIAL/PLATELET
Abs Immature Granulocytes: 0.02 10*3/uL (ref 0.00–0.07)
Basophils Absolute: 0 10*3/uL (ref 0.0–0.1)
Basophils Relative: 0 %
Eosinophils Absolute: 0.2 10*3/uL (ref 0.0–0.5)
Eosinophils Relative: 3 %
HCT: 23.9 % — ABNORMAL LOW (ref 39.0–52.0)
Hemoglobin: 7.6 g/dL — ABNORMAL LOW (ref 13.0–17.0)
Immature Granulocytes: 0 %
Lymphocytes Relative: 16 %
Lymphs Abs: 1.3 10*3/uL (ref 0.7–4.0)
MCH: 28.3 pg (ref 26.0–34.0)
MCHC: 31.8 g/dL (ref 30.0–36.0)
MCV: 88.8 fL (ref 80.0–100.0)
Monocytes Absolute: 0.9 10*3/uL (ref 0.1–1.0)
Monocytes Relative: 11 %
Neutro Abs: 5.7 10*3/uL (ref 1.7–7.7)
Neutrophils Relative %: 70 %
Platelets: 526 10*3/uL — ABNORMAL HIGH (ref 150–400)
RBC: 2.69 MIL/uL — ABNORMAL LOW (ref 4.22–5.81)
RDW: 16.9 % — ABNORMAL HIGH (ref 11.5–15.5)
WBC: 8 10*3/uL (ref 4.0–10.5)
nRBC: 0 % (ref 0.0–0.2)

## 2021-01-27 LAB — GLUCOSE, CAPILLARY
Glucose-Capillary: 149 mg/dL — ABNORMAL HIGH (ref 70–99)
Glucose-Capillary: 152 mg/dL — ABNORMAL HIGH (ref 70–99)
Glucose-Capillary: 154 mg/dL — ABNORMAL HIGH (ref 70–99)
Glucose-Capillary: 185 mg/dL — ABNORMAL HIGH (ref 70–99)
Glucose-Capillary: 215 mg/dL — ABNORMAL HIGH (ref 70–99)
Glucose-Capillary: 93 mg/dL (ref 70–99)

## 2021-01-27 LAB — VANCOMYCIN, PEAK: Vancomycin Pk: 33 ug/mL (ref 30–40)

## 2021-01-27 LAB — HEPATIC FUNCTION PANEL
ALT: 10 U/L (ref 0–44)
AST: 11 U/L — ABNORMAL LOW (ref 15–41)
Albumin: 2.2 g/dL — ABNORMAL LOW (ref 3.5–5.0)
Alkaline Phosphatase: 87 U/L (ref 38–126)
Bilirubin, Direct: 0.1 mg/dL (ref 0.0–0.2)
Indirect Bilirubin: 0.4 mg/dL (ref 0.3–0.9)
Total Bilirubin: 0.5 mg/dL (ref 0.3–1.2)
Total Protein: 5.8 g/dL — ABNORMAL LOW (ref 6.5–8.1)

## 2021-01-27 LAB — RENAL FUNCTION PANEL
Albumin: 2.2 g/dL — ABNORMAL LOW (ref 3.5–5.0)
Anion gap: 9 (ref 5–15)
BUN: 9 mg/dL (ref 6–20)
CO2: 30 mmol/L (ref 22–32)
Calcium: 8.3 mg/dL — ABNORMAL LOW (ref 8.9–10.3)
Chloride: 91 mmol/L — ABNORMAL LOW (ref 98–111)
Creatinine, Ser: 0.48 mg/dL — ABNORMAL LOW (ref 0.61–1.24)
GFR, Estimated: >60 mL/min (ref >60)
Glucose, Bld: 194 mg/dL — ABNORMAL HIGH (ref 70–99)
Phosphorus: 3.7 mg/dL (ref 2.5–4.6)
Potassium: 3.8 mmol/L (ref 3.5–5.1)
Sodium: 130 mmol/L — ABNORMAL LOW (ref 135–145)

## 2021-01-27 MED ORDER — PROSOURCE PLUS PO LIQD
30.0000 mL | Freq: Three times a day (TID) | ORAL | Status: DC
  Administered 2021-01-27 – 2021-01-28 (×3): 30 mL via ORAL
  Filled 2021-01-27 (×3): qty 30

## 2021-01-27 MED ORDER — BOOST / RESOURCE BREEZE PO LIQD CUSTOM
1.0000 | Freq: Three times a day (TID) | ORAL | Status: DC
  Administered 2021-01-27 (×2): 1 via ORAL

## 2021-01-27 NOTE — Progress Notes (Signed)
Pt back to unit from MRI. Pt alert and verbally responsive. Dionne Bucy RN   01/27/21 2115  Vitals  Temp 97.9 F (36.6 C)  Temp Source Oral  BP 118/73  MAP (mmHg) 85  BP Location Left Arm  BP Method Automatic  Patient Position (if appropriate) Lying  Pulse Rate 86  Pulse Rate Source Monitor  ECG Heart Rate 84  Resp 19  Level of Consciousness  Level of Consciousness Alert  MEWS COLOR  MEWS Score Color Green  Oxygen Therapy  SpO2 92 %  O2 Device Room Air  MEWS Score  MEWS Temp 0  MEWS Systolic 0  MEWS Pulse 0  MEWS RR 0  MEWS LOC 0  MEWS Score 0

## 2021-01-27 NOTE — Progress Notes (Signed)
Inpatient Rehabilitation Admissions Coordinator   I will place order for rehab consult as patient has progressed up out of bed.  Ottie Glazier, RN, MSN Rehab Admissions Coordinator [phone] 01/27/2021 11:35 AM

## 2021-01-27 NOTE — Progress Notes (Signed)
[facility] PROGRESS NOTE    Progress Note  Louis [name]  ZOX:[id number] DOB: 05/26/1966 DOA: 01/05/2021 PCP: Bobette MoBlackburn, Helen, NP     Brief Narrative:   55 y.o. male  DM TY 2-A1c recently 9 HTN CABG 2017 chronic pain  Recent osteomyelitis 6/19-12/26/2020 discitis L3-S1 status post laminectomy 6/28 left psoas abscess I&D on 6/29 --then evacuation abscess 12/21/2020--ID recommended daptomycin through 8/10 for MRSA growing on blood culture and TTE was negative Readmit 7/14 with 5 days of worsening back pain severe constant bilateral radiation MRI of the lumbar spine showed multiple fluid collections and L4-L5, neurosurgery felt no need for surgical intervention as he was not neurological compromise.  Daptomycin MIC was not susceptible on lab from 01/06/2019 to  clarify inability clearing the infection.  Negative agitation TEE this admission  IR drained 5 mL L3 level subdural fluid collection.  White blood cell count is improving on vancomycin and cefazolin started on 01/13/2021--Persistent bacteremia with Blood cultures on 01/05/2021, 01/08/2019, 01/09/2021, 01/11/2021, 01/12/2021 positive for MRSA.   Blood cultures repeated on 01/14/2021, 01/16/2021 and 01/18/2021 have remained negative till date this been on IV vancomycin and Teflaro  Pertinent events: Status postlaminectomy and evacuation of abscess on 12/21/2020 12/22/2020 IR consulted and placed a drain in the iliopsoas abscess 7/13, 7/14 and 7/17 blood cultures positive for MRSA 01/06/2021 neurosurgery was consulted and since the patient was neurologically intact they do not feel he needs emergent decompression. 01/11/2021 TEE negative for vegetation. 01/12/2021 CT scan of the abdomen pelvis showed progressive anasarca progressive erosion of L3-L4 with increasing paraspinal inflammatory collections adjacent to L3-L5 without drainable fluid at this time. 01/14/2021 blood cultures negative till date.   Assessment/Plan:   Sepsis and secondary to  discitis and epidural abscess/discitis:  IV vancomycin and certraline until 01/28/2021 and switch back to IV vancomycin alone for 6 weeks. MRI w/without contrast ordered 01/29/2019.  Persistent MRSA bacteremia: ID recommended to continue IV vancomycin and ceftaroline the plan to continue IV empiric antibiotics until 01/28/2021 and then continue IV vancomycin alone. WBC decreasing is reassuring  Acute metabolic encephalopathy??  Cognitive impairment Became encephalopathic, likely multifactorial in the setting of opioids, metabolic and infectious etiology--this is improved significantly  MRI of the brain showed no acute findings. Currently on Haldol 2 mg every 6 as needed agitation Effexor 75 mg AM  Severe protein caloric malnutrition/anasarca: Prealbumin of 5.9. Patient removed core track, several times this admission core track was initially placed on 8/1 Continue enteral feeds until eating about 50% of meals Follow calorie count in progress he is now eating better  Uncontrolled diabetes mellitus type 2: Continue lantus 5, SSI CBG 140-180  Abnormal EKG: Had ST segment elevation V1 V2 without reciprocal changes cardiology was consulted and a 2D echo showed no wall motion abnormalities cardiac biomarkers remain remained flat.  Chronic diastolic heart failure: Appears anasarca on physical exam likely due to low albumin. His nutrition has been started on tube feedings. Slowly improving.  Probable hypervolemic hyponatremia He is third spacing, his albumin is slowly started to trend down Start back Lasix 20  day iv for now--adjust in next several days, and liekly may need to cut back free water Start free water per tube.  Mechanical fall: Patient had a fall on 12/31/2020. CBD CT scan of the head showed no acute findings.  Continue fall precautions.  Cholestasis: Reactive due to infectious trends complete metabolic panel. GERD: Continue Protonix. Oral thrush:Completed 7-day course of  Diflucan. Thrombocytosis: Anemia Reactive and secondary to infection  Cysts  DVT prophylaxis: lovenox Family Communication: Wife updated at the bedside in detail on both days Status is: Inpatient  Remains inpatient appropriate because:Hemodynamically unstable  Dispo: The patient is from: Home              Anticipated d/c is to: [facility]              Patient currently is not medically stable to d/c.   Difficult to place patient No    Code Status:     Code Status Orders  (From admission, onward)           Start     Ordered   01/06/21 0224  Full code  Continuous        01/06/21 0226           Code Status History     Date Active Date Inactive Code Status Order ID Comments User Context   12/12/2020 2253 12/26/2020 2008 Full Code [id number]  Synetta Fail, MD ED         IV Access:   Peripheral IV   Procedures and diagnostic studies:   DG Abd Portable 1V  Result Date: 01/26/2021 CLINICAL DATA:  Feeding to EXAM: PORTABLE ABDOMEN - 1 VIEW COMPARISON:  01/24/2021 FINDINGS: Feeding tube tip overlies the gastric antrum/proximal duodenum. This is unchanged from prior exam. Nonobstructive bowel gas pattern. Prior median sternotomy. IMPRESSION: Feeding tube tip overlies the gastric antrum/proximal duodenum. Electronically Signed   By: Caprice Renshaw   On: 01/26/2021 13:48     Medical Consultants:   Infectious disease infectious disease   Subjective:   More interactive less depressed appearing ha multiple foods that he likes ([employer] biscuit with gravy etc. etc. eating 100%) Has some left leg pain lateral upper thigh radiating to knee-  Objective:    Vitals:   01/27/21 0728 01/27/21 1126 01/27/21 1539 01/27/21 1604  BP: 101/71 108/76 108/71   Pulse: 80 84 76 75  Resp: 16 18 18 16   Temp: 97.8 F (36.6 C) 97.8 F (36.6 C)  97.8 F (36.6 C)  TempSrc: Oral Oral  Oral  SpO2: 98% 100% 98% 97%  Weight:       SpO2: 97 % O2 Flow Rate (L/min): 2  L/min   Intake/Output Summary (Last 24 hours) at 01/27/2021 1625 Last data filed at 01/27/2021 1604 Gross per 24 hour  Intake 6362.58 ml  Output 1800 ml  Net 4562.58 ml    Filed Weights   01/09/21 1700 01/13/21 1436 01/24/21 1317  Weight: 92.9 kg 94.3 kg 97.4 kg    Exam: Pleasant coherent no distress no icterus no pallor Chest clear no added sound rales or rhonchi Abdomen soft no rebound Straight leg raise left side intact ROM intact power 5/5 Psych euthymic congruent pleasant  Data Reviewed:    Labs: Basic Metabolic Panel: Recent Labs  Lab 01/21/21 0252 01/22/21 0502 01/25/21 0247 01/27/21 0325  NA 131* 132* 129* 130*  K 4.1 3.6 3.5 3.8  CL 92* 98 94* 91*  CO2 29 26 28 30   GLUCOSE 201* 134* 140* 194*  BUN 7 7 7 9   CREATININE 0.44* 0.44* 0.52* 0.48*  CALCIUM 8.4* 8.3* 8.0* 8.3*  MG 1.8  --   --   --   PHOS 2.8  --   --  3.7    GFR Estimated Creatinine Clearance: 121.2 mL/min (A) (by C-G formula based on SCr of 0.48 mg/dL (L)). Liver Function Tests: Recent Labs  Lab 01/21/21 0252 01/22/21  0502 01/25/21 0247 01/27/21 0325  AST 12* 14* 10* 11*  ALT 9 10 8 10   ALKPHOS 76 74 71 87  BILITOT 0.3 0.9 0.5 0.5  PROT 6.1* 6.1* 5.5* 5.8*  ALBUMIN 2.6* 2.5* 2.2* 2.2*  2.2*    No results for input(s): LIPASE, AMYLASE in the last 168 hours. No results for input(s): AMMONIA in the last 168 hours. Coagulation profile No results for input(s): INR, PROTIME in the last 168 hours. COVID-19 Labs  Recent Labs    01/25/21 0247  CRP 4.1*     Lab Results  Component Value Date   SARSCOV2NAA NEGATIVE 01/05/2021   SARSCOV2NAA NEGATIVE 12/12/2020    CBC: Recent Labs  Lab 01/21/21 0252 01/22/21 0502 01/25/21 0247 01/27/21 0325  WBC 12.4* 11.8* 12.2* 8.0  NEUTROABS 9.5* 9.0* 9.4* 5.7  HGB 8.8* 8.8* 7.5* 7.6*  HCT 27.0* 26.8* 23.0* 23.9*  MCV 88.2 86.7 88.5 88.8  PLT 539* 463* 472* 526*    Cardiac Enzymes: No results for input(s): CKTOTAL, CKMB,  CKMBINDEX, TROPONINI in the last 168 hours. BNP (last 3 results) No results for input(s): PROBNP in the last 8760 hours. CBG: Recent Labs  Lab 01/26/21 2344 01/27/21 0444 01/27/21 0742 01/27/21 1124 01/27/21 1601  GLUCAP 191* 215* 154* 185* 149*    D-Dimer: No results for input(s): DDIMER in the last 72 hours. Hgb A1c: No results for input(s): HGBA1C in the last 72 hours. Lipid Profile: No results for input(s): CHOL, HDL, LDLCALC, TRIG, CHOLHDL, LDLDIRECT in the last 72 hours. Thyroid function studies: No results for input(s): TSH, T4TOTAL, T3FREE, THYROIDAB in the last 72 hours.  Invalid input(s): FREET3 Anemia work up: No results for input(s): VITAMINB12, FOLATE, FERRITIN, TIBC, IRON, RETICCTPCT in the last 72 hours.  Sepsis Labs: Recent Labs  Lab 01/21/21 0252 01/22/21 0502 01/25/21 0247 01/27/21 0325  WBC 12.4* 11.8* 12.2* 8.0    Microbiology Recent Results (from the past 240 hour(s))  Culture, blood (routine x 2)     Status: None   Collection Time: 01/18/21  5:30 AM   Specimen: BLOOD RIGHT HAND  Result Value Ref Range Status   Specimen Description BLOOD RIGHT HAND  Final   Special Requests   Final    BOTTLES DRAWN AEROBIC ONLY Blood Culture adequate volume   Culture   Final    NO GROWTH 5 DAYS Performed at [facility], [address], [place], [place]    Report Status 01/23/2021 FINAL  Final  Culture, blood (routine x 2)     Status: None   Collection Time: 01/18/21  5:34 AM   Specimen: BLOOD LEFT HAND  Result Value Ref Range Status   Specimen Description BLOOD LEFT HAND  Final   Special Requests   Final    BOTTLES DRAWN AEROBIC ONLY Blood Culture adequate volume   Culture   Final    NO GROWTH 5 DAYS Performed at [facility], [address], [place], [place]    Report Status 01/23/2021 FINAL  Final     Medications:    (feeding supplement) PROSource Plus  30 mL Oral TID BM   amLODipine  5 mg Oral Daily    aspirin EC  81 mg Oral Daily   atorvastatin  40 mg Oral QHS   diclofenac Sodium  2 g Topical QID   enoxaparin (LOVENOX) injection  40 mg Subcutaneous QHS   feeding supplement  1 Container Oral TID BM   feeding supplement (GLUCERNA 1.5  CAL)  1,000 mL Per Tube Q24H   feeding supplement (PROSource TF)  45 mL Per Tube BID   folic acid  1 mg Oral Daily   free water  200 mL Per Tube Q4H   furosemide  20 mg Intravenous Daily   insulin aspart  0-15 Units Subcutaneous Q4H   insulin aspart  4 Units Subcutaneous TID WC   insulin glargine  5 Units Subcutaneous Daily   losartan  25 mg Oral q morning   mouth rinse  15 mL Mouth Rinse BID   melatonin  3 mg Oral QHS   metoprolol succinate  25 mg Oral Daily   multivitamin with minerals  1 tablet Oral Daily   oxyCODONE  10 mg Oral Q12H   pantoprazole  40 mg Oral BID   psyllium  1 packet Oral Daily   venlafaxine XR  75 mg Oral q morning   Continuous Infusions:  ceFTAROline (TEFLARO) IV 600 mg (01/27/21 0923)   vancomycin 1,500 mg (01/27/21 1113)      LOS: 21 days   Louis [name]  [facility]  01/27/2021, 4:25 PM

## 2021-01-27 NOTE — Progress Notes (Signed)
Calorie Count Note  48 hour calorie count ordered.  Diet: regular Supplements: Ensure Enlive po TID, each supplement provides 350 kcal and 20 grams of protein; Magic cup TID with meals, each supplement provides 290 kcal and 9 grams of protein; MVI with minerals daily  Spoke with pt and wife at bedside. Both report improved oral intake since transitioning to nocturnal feedings. Pt has been consuming outside food. Per wife, pt consumed 100% of ham sandwich last night. Observed pt consumed about 50% of fast food biscuits and gravy this morning.   Pt shares he does not like the Ensure supplements ("I haven't liked them since before I got here"). Pt wife explains that she tried to encourage pt to drink them PTA with minimal success. RD reviewed other supplements on formulary- he is amenable to continue [employer], as well as [employer]. Discussed plan for calorie count; encouraged intake of food and supplements to help with progression of cortrak tube removal.   8/3-8/4 Breakfast: 300 kcals, 5 grams protein Lunch: nothing documented yet Dinner: 460 kcals, 33 grams protein  Total intake: 760 kcal (33% of minimum estimated needs)  38 grams protein (33% of minimum estimated needs)  Nutrition Dx: Severe Malnutrition related to acute illness (MRSA bacteremia) as evidenced by mild fat depletion, moderate muscle depletion, energy intake < or equal to 50% for > or equal to 5 days; ongoing, being addressed via TF, PO intake, and oral nutrition supplements  Goal: Patient will meet greater than or equal to 90% of their needs; progressing   Intervention:   -Initiate 48 hour calorie count -D/c Ensure Enlive po BID, each supplement provides 350 kcal and 20 grams of protein  -Continue Magic cup TID with meals, each supplement provides 290 kcal and 9 grams of protein  -Continue MVI with minerals daily -Boost Breeze po TID, each supplement provides 250 kcal and 9 grams of protein  -30 ml  Prosource Plus TID, each supplement provides 100 kcals and 15 grams protein -Continue nocturnal TF:  - Glucerna 1.5 @ 90 ml/hr x 12 hours from 1800 to 0600 (total of 1080 ml) - ProSource TF 45 ml BID - Free water flushes per MD, currently 200 ml q 4 hours   Nocturnal tube feeding regimen provides 1700 kcal, 111 grams of protein, and 820 ml of H2O (meets 74% of kcal needs and 97% of protein needs).  Levada Schilling, RD, LDN, CDCES Registered Dietitian II Certified Diabetes Care and Education Specialist Please refer to [facility] for RD and/or RD on-call/weekend/after hours pager

## 2021-01-27 NOTE — Plan of Care (Signed)

## 2021-01-27 NOTE — Telephone Encounter (Signed)
-----   Message from Judyann Munson, MD sent at 01/27/2021  9:47 AM EDT ----- Can he be seen in 5-6 wk. [facility] preferably

## 2021-01-27 NOTE — Progress Notes (Signed)
Physical Therapy Treatment Patient Details Name: Louis [name] MRN: [id number] DOB: December 20, 1965 Today's Date: 01/27/2021    History of Present Illness Pt is a 55 y.o. male who presented 7/13 with worsening back pain with recurrent MRSA bacteremia after discharging 7/3 from being admitted with epidural abscess, vertebral osteomyelitis L3-S1, psoas abscess, and MRSA bacteremia with s/p 12/21/20 T7-8, T12-L1, L3-4 laminectomies for evacuation of epidural abscess. 7/13 mri lumbar/thoracic showed several areas of fluid collection with a more loculated focus at L4-5. 7/20 imaging revealed progressive erosive changes involving the L3 and L4 vertebral bodies  in keeping with progressive changes of discitis osteomyelitis. PMH: CAD, hypertension, uncontrolled diabetes mellitus.    PT Comments    Pt has made significant progress in being able to tolerate transitions to sitting, maintaining sitting EOB for increased periods of time, and transfers to stand with several small lateral steps to transfer bed > recliner this date. He is requiring minA to power up to stand and modA with UE support to take lateral steps to the recliner. He reports increased dizziness and displays increased instability the longer he stood. Pt appeared to be able to adjust and tolerate the dizziness this date as he slowly transitioned to each position, pausing at each step. It appears that the dizziness may be caused by a slight decrease in his BP, see General Comments below. Educated pt to get up OOB and transfer to the commode with nursing and hydrate to try to improve his BP. Performed several seated exercises and educated pt and his wife on continuing his HEP to improve his strength. Will continue to follow acutely. Current recommendations remain appropriate.   Follow Up Recommendations  CIR;Supervision/Assistance - 24 hour     Equipment Recommendations  Hospital bed    Recommendations for Other Services Rehab consult     Precautions  / Restrictions Precautions Precautions: Back Precaution Booklet Issued: No Precaution Comments: monitor BP; provided cues to maintain during mobility Restrictions Weight Bearing Restrictions: No    Mobility  Bed Mobility Overal bed mobility: Needs Assistance Bed Mobility: Rolling;Sidelying to Sit Rolling: Supervision Sidelying to sit: HOB elevated;Min assist       General bed mobility comments: Slowly elevated HOB and allowed pt to rest and adjust for ~1 min each time with adjustment of HOB or rolling. Partial prop onto L elbow with legs supported by therapist for comfort to allow pt to adjust to beginning ascent to sit up EOB, minA to complete transition with pt pulling on therapist's hand.    Transfers Overall transfer level: Needs assistance Equipment used: 1 person hand held assist Transfers: Sit to/from [employer] Sit to Stand: Min assist Stand pivot transfers: Mod assist       General transfer comment: After sitting EOB for > 1 minute and dizziness reolving, pt powered up to stand with UE support from therapist anterior to him, minA. Stand step transfer to R with UE support from therapist anterior to him with pt becoming increasingly unsteady with occasional knee buckling as transfer progressed. Pt reported the dizziness worsened as the transfer progressed, needing modA to safely direct pt to recliner.  Ambulation/Gait Ambulation/Gait assistance: Mod assist Gait Distance (Feet): 2 Feet Assistive device: 1 person hand held assist Gait Pattern/deviations: Step-through pattern;Decreased stride length;Trunk flexed;Shuffle Gait velocity: reduced Gait velocity interpretation: <1.31 ft/sec, indicative of household ambulator General Gait Details: Pt taking small, shuffling lateral steps to R with UE support on therapist anterior to him for support to transfer bed > recliner. Increased  knee buckling and unsteadiness as dizziness increased as transfer progressed,  modA to safely direct to recliner.   Stairs             Wheelchair Mobility    Modified Rankin (Stroke Patients Only) Modified Rankin (Stroke Patients Only) Pre-Morbid Rankin Score: No symptoms Modified Rankin: Moderately severe disability     Balance Overall balance assessment: Needs assistance Sitting-balance support: Bilateral upper extremity supported;Feet supported Sitting balance-Leahy Scale: Poor Sitting balance - Comments: Static sitting EOB for > 1 min with UE support and min guard assist.   Standing balance support: Bilateral upper extremity supported Standing balance-Leahy Scale: Poor Standing balance comment: Reliant on UE support and external assistance for safe mobility.                            Cognition Arousal/Alertness: Awake/alert Behavior During Therapy: WFL for tasks assessed/performed Overall Cognitive Status: Impaired/Different from baseline Area of Impairment: Memory;Following commands;Safety/judgement;Awareness;Attention;Orientation                 Orientation Level: Disoriented to;Time (difficulty recalling current month) Current Attention Level: Focused Memory: Decreased recall of precautions;Decreased short-term memory Following Commands: Follows one step commands consistently;Follows one step commands with increased time Safety/Judgement: Decreased awareness of safety;Decreased awareness of deficits Awareness: Emergent   General Comments: Pt with improved attention span and ability to follow commands this date. Improved deficits and safety awareness. Still having difficulty recalling the current month.      Exercises General Exercises - Lower Extremity Long Arc Quad: AROM;Both;10 reps;Seated (isometric 2-3 hold at extension with eccentric control) Toe Raises: AROM;Both;10 reps;Seated Heel Raises: AROM;Both;10 reps;Seated    General Comments General comments (skin integrity, edema, etc.): pt continues to get  dizzy/lightheaded (denies the room spinning around him but rather just a small area?") with changes in positions. He did not seem to have symptoms with changing just his head position; BP during session: Supine start of session 107/75, HOB elevated 117/77, sitting up EOB 106/68, got dizzy standing and transferring but sitting following the transfer it was 106/74, after sitting for a while it was 108/71 with improved symptoms      Pertinent Vitals/Pain Pain Assessment: Faces Faces Pain Scale: Hurts little more Pain Location: bil LE's with movement Pain Descriptors / Indicators: Grimacing;Guarding;Cramping Pain Intervention(s): Limited activity within patient's tolerance;Premedicated before session;Monitored during session;Repositioned    Home Living                      Prior Function            PT Goals (current goals can now be found in the care plan section) Acute Rehab PT Goals Patient Stated Goal: to improve and not be dizzy PT Goal Formulation: With patient Time For Goal Achievement: 02/10/21 Potential to Achieve Goals: Fair Progress towards PT goals: Progressing toward goals    Frequency    Min 5X/week      PT Plan Current plan remains appropriate    Co-evaluation              AM-PAC PT "6 Clicks" Mobility   Outcome Measure  Help needed turning from your back to your side while in a flat bed without using bedrails?: A Little Help needed moving from lying on your back to sitting on the side of a flat bed without using bedrails?: A Little Help needed moving to and from a bed to a chair (including a wheelchair)?:  A Lot Help needed standing up from a chair using your arms (e.g., wheelchair or bedside chair)?: A Little Help needed to walk in hospital room?: A Lot Help needed climbing 3-5 steps with a railing? : Total 6 Click Score: 14    End of Session   Activity Tolerance: Other (comment);Patient tolerated treatment well (limited by  dizziness) Patient left: with call bell/phone within reach;in chair;with chair alarm set;with family/visitor present Nurse Communication: Mobility status;Other (comment) (BP and dizziness) PT Visit Diagnosis: Muscle weakness (generalized) (M62.81);Pain Pain - Right/Left: Left Pain - part of body: Leg     Time: 4782-9562 PT Time Calculation (min) (ACUTE ONLY): 42 min  Charges:  $Therapeutic Exercise: 8-22 mins $Therapeutic Activity: 23-37 mins                     Raymond Gurney, PT, DPT Acute Rehabilitation Services  Pager: [phone] Office: [phone]    Louis [name] 01/27/2021, 4:09 PM

## 2021-01-28 ENCOUNTER — Inpatient Hospital Stay (HOSPITAL_COMMUNITY)
Admission: RE | Admit: 2021-01-28 | Discharge: 2021-02-20 | DRG: 945 | Disposition: A | Discharge status: Home Health | Payer: Federal, State, Local not specified - PPO | Source: Intra-hospital | Attending: Physical Medicine & Rehabilitation | Admitting: Physical Medicine & Rehabilitation

## 2021-01-28 ENCOUNTER — Other Ambulatory Visit: Payer: Self-pay

## 2021-01-28 ENCOUNTER — Encounter (HOSPITAL_COMMUNITY): Payer: Self-pay | Admitting: Physical Medicine & Rehabilitation

## 2021-01-28 DIAGNOSIS — G43909 Migraine, unspecified, not intractable, without status migrainosus: Secondary | ICD-10-CM | POA: Diagnosis present

## 2021-01-28 DIAGNOSIS — F17210 Nicotine dependence, cigarettes, uncomplicated: Secondary | ICD-10-CM | POA: Diagnosis present

## 2021-01-28 DIAGNOSIS — E876 Hypokalemia: Secondary | ICD-10-CM | POA: Diagnosis not present

## 2021-01-28 DIAGNOSIS — Z951 Presence of aortocoronary bypass graft: Secondary | ICD-10-CM

## 2021-01-28 DIAGNOSIS — I110 Hypertensive heart disease with heart failure: Secondary | ICD-10-CM | POA: Diagnosis not present

## 2021-01-28 DIAGNOSIS — E1165 Type 2 diabetes mellitus with hyperglycemia: Secondary | ICD-10-CM

## 2021-01-28 DIAGNOSIS — D509 Iron deficiency anemia, unspecified: Secondary | ICD-10-CM | POA: Diagnosis not present

## 2021-01-28 DIAGNOSIS — R0989 Other specified symptoms and signs involving the circulatory and respiratory systems: Secondary | ICD-10-CM | POA: Diagnosis not present

## 2021-01-28 DIAGNOSIS — R5381 Other malaise: Principal | ICD-10-CM

## 2021-01-28 DIAGNOSIS — Z79899 Other long term (current) drug therapy: Secondary | ICD-10-CM | POA: Diagnosis not present

## 2021-01-28 DIAGNOSIS — I5032 Chronic diastolic (congestive) heart failure: Secondary | ICD-10-CM

## 2021-01-28 DIAGNOSIS — E222 Syndrome of inappropriate secretion of antidiuretic hormone: Secondary | ICD-10-CM

## 2021-01-28 DIAGNOSIS — D6489 Other specified anemias: Secondary | ICD-10-CM | POA: Diagnosis not present

## 2021-01-28 DIAGNOSIS — L8990 Pressure ulcer of unspecified site, unspecified stage: Secondary | ICD-10-CM | POA: Insufficient documentation

## 2021-01-28 DIAGNOSIS — M79604 Pain in right leg: Secondary | ICD-10-CM | POA: Diagnosis not present

## 2021-01-28 DIAGNOSIS — L258 Unspecified contact dermatitis due to other agents: Secondary | ICD-10-CM | POA: Diagnosis not present

## 2021-01-28 DIAGNOSIS — E785 Hyperlipidemia, unspecified: Secondary | ICD-10-CM | POA: Diagnosis present

## 2021-01-28 DIAGNOSIS — R1312 Dysphagia, oropharyngeal phase: Secondary | ICD-10-CM | POA: Diagnosis not present

## 2021-01-28 DIAGNOSIS — I2510 Atherosclerotic heart disease of native coronary artery without angina pectoris: Secondary | ICD-10-CM | POA: Diagnosis not present

## 2021-01-28 DIAGNOSIS — E1169 Type 2 diabetes mellitus with other specified complication: Secondary | ICD-10-CM | POA: Diagnosis not present

## 2021-01-28 DIAGNOSIS — Z86718 Personal history of other venous thrombosis and embolism: Secondary | ICD-10-CM | POA: Diagnosis not present

## 2021-01-28 DIAGNOSIS — M7989 Other specified soft tissue disorders: Secondary | ICD-10-CM | POA: Diagnosis not present

## 2021-01-28 DIAGNOSIS — M545 Low back pain, unspecified: Secondary | ICD-10-CM

## 2021-01-28 DIAGNOSIS — M62838 Other muscle spasm: Secondary | ICD-10-CM | POA: Diagnosis present

## 2021-01-28 DIAGNOSIS — G062 Extradural and subdural abscess, unspecified: Secondary | ICD-10-CM | POA: Diagnosis not present

## 2021-01-28 DIAGNOSIS — E871 Hypo-osmolality and hyponatremia: Secondary | ICD-10-CM | POA: Diagnosis not present

## 2021-01-28 DIAGNOSIS — L89152 Pressure ulcer of sacral region, stage 2: Secondary | ICD-10-CM | POA: Diagnosis present

## 2021-01-28 DIAGNOSIS — D75839 Thrombocytosis, unspecified: Secondary | ICD-10-CM | POA: Diagnosis not present

## 2021-01-28 DIAGNOSIS — D75838 Other thrombocytosis: Secondary | ICD-10-CM | POA: Diagnosis not present

## 2021-01-28 DIAGNOSIS — M4620 Osteomyelitis of vertebra, site unspecified: Secondary | ICD-10-CM

## 2021-01-28 DIAGNOSIS — M79605 Pain in left leg: Secondary | ICD-10-CM | POA: Diagnosis not present

## 2021-01-28 DIAGNOSIS — Z7982 Long term (current) use of aspirin: Secondary | ICD-10-CM | POA: Diagnosis not present

## 2021-01-28 LAB — GLUCOSE, CAPILLARY
Glucose-Capillary: 156 mg/dL — ABNORMAL HIGH (ref 70–99)
Glucose-Capillary: 177 mg/dL — ABNORMAL HIGH (ref 70–99)
Glucose-Capillary: 265 mg/dL — ABNORMAL HIGH (ref 70–99)
Glucose-Capillary: 110 mg/dL — ABNORMAL HIGH (ref 70–99)
Glucose-Capillary: 122 mg/dL — ABNORMAL HIGH (ref 70–99)

## 2021-01-28 LAB — C-REACTIVE PROTEIN: CRP: 6.8 mg/dL — ABNORMAL HIGH (ref <1.0)

## 2021-01-28 LAB — VANCOMYCIN, TROUGH: Vancomycin Tr: 22 ug/mL (ref 15–20)

## 2021-01-28 MED ORDER — VANCOMYCIN IV (FOR PTA / DISCHARGE USE ONLY): 1500.0000 mg | Freq: Two times a day (BID) | INTRAVENOUS | 0 refills | Status: DC

## 2021-01-28 MED ORDER — INSULIN ASPART 100 UNIT/ML IJ SOLN
4.0000 [IU] | Freq: Three times a day (TID) | INTRAMUSCULAR | Status: DC
  Administered 2021-01-29 – 2021-02-20 (×62): 4 [IU] via SUBCUTANEOUS

## 2021-01-28 MED ORDER — VANCOMYCIN HCL 1500 MG/300ML IV SOLN
1500.0000 mg | Freq: Two times a day (BID) | INTRAVENOUS | Status: DC
  Administered 2021-01-28 – 2021-02-14 (×36): 1500 mg via INTRAVENOUS
  Filled 2021-01-28 (×40): qty 300

## 2021-01-28 MED ORDER — FUROSEMIDE 10 MG/ML IJ SOLN: 20.0000 mg | Freq: Every day | INTRAMUSCULAR | 0 refills | Status: DC

## 2021-01-28 MED ORDER — DICLOFENAC SODIUM 1 % EX GEL
2.0000 g | Freq: Four times a day (QID) | CUTANEOUS | Status: DC
  Administered 2021-01-28 – 2021-02-20 (×52): 2 g via TOPICAL
  Filled 2021-01-28: qty 100

## 2021-01-28 MED ORDER — ORAL CARE MOUTH RINSE
15.0000 mL | Freq: Two times a day (BID) | OROMUCOSAL | Status: DC
  Administered 2021-01-28 – 2021-02-20 (×28): 15 mL via OROMUCOSAL

## 2021-01-28 MED ORDER — PROSOURCE TF PO LIQD: 45.0000 mL | Freq: Two times a day (BID) | ORAL | Status: DC

## 2021-01-28 MED ORDER — VENLAFAXINE HCL ER 75 MG PO CP24
75.0000 mg | ORAL_CAPSULE | Freq: Every morning | ORAL | Status: DC
  Administered 2021-01-29 – 2021-02-17 (×19): 75 mg via ORAL
  Filled 2021-01-28 (×20): qty 1

## 2021-01-28 MED ORDER — BOOST / RESOURCE BREEZE PO LIQD CUSTOM
1.0000 | Freq: Three times a day (TID) | ORAL | Status: DC
  Administered 2021-01-29 – 2021-01-30 (×3): 1 via ORAL

## 2021-01-28 MED ORDER — ONDANSETRON HCL 4 MG PO TABS
4.0000 mg | ORAL_TABLET | Freq: Four times a day (QID) | ORAL | Status: DC | PRN
  Administered 2021-02-05 – 2021-02-14 (×6): 4 mg via ORAL
  Filled 2021-01-28 (×6): qty 1

## 2021-01-28 MED ORDER — DIPHENHYDRAMINE HCL 12.5 MG/5ML PO ELIX: 12.5000 mg | ORAL_SOLUTION | Freq: Four times a day (QID) | ORAL | Status: DC | PRN

## 2021-01-28 MED ORDER — POLYETHYLENE GLYCOL 3350 17 G PO PACK
17.0000 g | PACK | Freq: Every day | ORAL | Status: DC | PRN
Start: 2021-01-28 — End: 2021-02-20

## 2021-01-28 MED ORDER — ONDANSETRON HCL 4 MG/2ML IJ SOLN
4.0000 mg | Freq: Four times a day (QID) | INTRAMUSCULAR | Status: DC | PRN
  Administered 2021-02-05 – 2021-02-13 (×8): 4 mg via INTRAVENOUS
  Filled 2021-01-28 (×9): qty 2

## 2021-01-28 MED ORDER — ENOXAPARIN SODIUM 40 MG/0.4ML IJ SOSY
40.0000 mg | PREFILLED_SYRINGE | INTRAMUSCULAR | Status: DC
  Administered 2021-01-28 – 2021-02-19 (×23): 40 mg via SUBCUTANEOUS
  Filled 2021-01-28 (×23): qty 0.4

## 2021-01-28 MED ORDER — PROCHLORPERAZINE MALEATE 5 MG PO TABS
5.0000 mg | ORAL_TABLET | Freq: Four times a day (QID) | ORAL | Status: DC | PRN
  Administered 2021-02-07 – 2021-02-12 (×4): 10 mg via ORAL
  Filled 2021-01-28 (×6): qty 2

## 2021-01-28 MED ORDER — FREE WATER: 200.0000 mL | Status: DC

## 2021-01-28 MED ORDER — ALUM & MAG HYDROXIDE-SIMETH 200-200-20 MG/5ML PO SUSP: 30.0000 mL | ORAL | Status: DC | PRN

## 2021-01-28 MED ORDER — ACETAMINOPHEN 325 MG PO TABS
325.0000 mg | ORAL_TABLET | ORAL | Status: DC | PRN
  Administered 2021-01-31 – 2021-02-16 (×11): 650 mg via ORAL
  Filled 2021-01-28 (×14): qty 2

## 2021-01-28 MED ORDER — METOPROLOL SUCCINATE ER 25 MG PO TB24: 25.0000 mg | ORAL_TABLET | Freq: Every day | ORAL | Status: DC

## 2021-01-28 MED ORDER — LOSARTAN POTASSIUM 50 MG PO TABS
25.0000 mg | ORAL_TABLET | Freq: Every morning | ORAL | Status: DC
  Administered 2021-01-29 – 2021-02-08 (×11): 25 mg via ORAL
  Filled 2021-01-28 (×11): qty 1

## 2021-01-28 MED ORDER — PSYLLIUM 95 % PO PACK
1.0000 | PACK | Freq: Every day | ORAL | Status: DC
  Administered 2021-01-30 – 2021-02-20 (×14): 1 via ORAL
  Filled 2021-01-28 (×23): qty 1

## 2021-01-28 MED ORDER — JUVEN PO PACK
1.0000 | PACK | Freq: Three times a day (TID) | ORAL | Status: DC
  Administered 2021-01-28 – 2021-02-20 (×18): 1 via ORAL
  Filled 2021-01-28 (×28): qty 1

## 2021-01-28 MED ORDER — GLUCERNA 1.5 CAL PO LIQD: 1000.0000 mL | ORAL | Status: DC

## 2021-01-28 MED ORDER — INSULIN ASPART 100 UNIT/ML IJ SOLN
0.0000 [IU] | INTRAMUSCULAR | Status: DC
  Administered 2021-01-29 (×4): 3 [IU] via SUBCUTANEOUS
  Administered 2021-01-30: 2 [IU] via SUBCUTANEOUS
  Administered 2021-01-30 (×2): 3 [IU] via SUBCUTANEOUS
  Administered 2021-01-30: 5 [IU] via SUBCUTANEOUS
  Administered 2021-01-30: 3 [IU] via SUBCUTANEOUS
  Administered 2021-01-30: 2 [IU] via SUBCUTANEOUS
  Administered 2021-01-30 – 2021-01-31 (×3): 3 [IU] via SUBCUTANEOUS
  Administered 2021-01-31 (×3): 5 [IU] via SUBCUTANEOUS
  Administered 2021-02-01 (×2): 3 [IU] via SUBCUTANEOUS
  Administered 2021-02-01 (×2): 5 [IU] via SUBCUTANEOUS

## 2021-01-28 MED ORDER — INSULIN GLARGINE-YFGN 100 UNIT/ML ~~LOC~~ SOLN
5.0000 [IU] | Freq: Every day | SUBCUTANEOUS | Status: DC
  Administered 2021-01-29: 5 [IU] via SUBCUTANEOUS
  Filled 2021-01-28: qty 0.05

## 2021-01-28 MED ORDER — GLUCERNA 1.5 CAL PO LIQD
1000.0000 mL | ORAL | Status: DC
  Administered 2021-01-28 – 2021-01-31 (×4): 1000 mL
  Filled 2021-01-28 (×4): qty 1000

## 2021-01-28 MED ORDER — PANTOPRAZOLE SODIUM 40 MG PO TBEC
40.0000 mg | DELAYED_RELEASE_TABLET | Freq: Two times a day (BID) | ORAL | Status: DC
  Administered 2021-01-28 – 2021-02-20 (×46): 40 mg via ORAL
  Filled 2021-01-28 (×46): qty 1

## 2021-01-28 MED ORDER — OXYCODONE HCL 5 MG PO TABS
5.0000 mg | ORAL_TABLET | ORAL | Status: DC | PRN
  Administered 2021-01-29 – 2021-02-16 (×43): 5 mg via ORAL
  Filled 2021-01-28 (×50): qty 1

## 2021-01-28 MED ORDER — PROSOURCE PLUS PO LIQD: 30.0000 mL | Freq: Three times a day (TID) | ORAL | Status: DC

## 2021-01-28 MED ORDER — METOPROLOL SUCCINATE ER 25 MG PO TB24
25.0000 mg | ORAL_TABLET | Freq: Every day | ORAL | Status: DC
  Administered 2021-01-29 – 2021-02-20 (×23): 25 mg via ORAL
  Filled 2021-01-28 (×24): qty 1

## 2021-01-28 MED ORDER — ATORVASTATIN CALCIUM 40 MG PO TABS
40.0000 mg | ORAL_TABLET | Freq: Every day | ORAL | Status: DC
  Administered 2021-01-28 – 2021-02-08 (×12): 40 mg via ORAL
  Filled 2021-01-28 (×12): qty 1

## 2021-01-28 MED ORDER — ADULT MULTIVITAMIN W/MINERALS CH
1.0000 | ORAL_TABLET | Freq: Every day | ORAL | Status: DC
  Administered 2021-01-29 – 2021-02-20 (×23): 1 via ORAL
  Filled 2021-01-28 (×23): qty 1

## 2021-01-28 MED ORDER — FUROSEMIDE 10 MG/ML IJ SOLN
20.0000 mg | Freq: Every day | INTRAMUSCULAR | Status: DC
  Administered 2021-01-29 – 2021-01-31 (×3): 20 mg via INTRAVENOUS
  Filled 2021-01-28 (×3): qty 2

## 2021-01-28 MED ORDER — MELATONIN 3 MG PO TABS
3.0000 mg | ORAL_TABLET | Freq: Every day | ORAL | Status: DC
  Administered 2021-01-28 – 2021-02-19 (×22): 3 mg via ORAL
  Filled 2021-01-28 (×23): qty 1

## 2021-01-28 MED ORDER — FOLIC ACID 1 MG PO TABS
1.0000 mg | ORAL_TABLET | Freq: Every day | ORAL | Status: DC
  Administered 2021-01-29 – 2021-02-20 (×23): 1 mg via ORAL
  Filled 2021-01-28 (×23): qty 1

## 2021-01-28 MED ORDER — DICLOFENAC SODIUM 1 % EX GEL
1.0000 "application " | Freq: Four times a day (QID) | CUTANEOUS | Status: DC | PRN
  Administered 2021-01-30 – 2021-02-19 (×2): 1 via TOPICAL

## 2021-01-28 MED ORDER — GUAIFENESIN-DM 100-10 MG/5ML PO SYRP: 5.0000 mL | ORAL_SOLUTION | Freq: Four times a day (QID) | ORAL | Status: DC | PRN

## 2021-01-28 MED ORDER — PROCHLORPERAZINE EDISYLATE 10 MG/2ML IJ SOLN
5.0000 mg | Freq: Four times a day (QID) | INTRAMUSCULAR | Status: DC | PRN
  Administered 2021-02-11: 10 mg via INTRAMUSCULAR
  Filled 2021-01-28: qty 2

## 2021-01-28 MED ORDER — ASPIRIN EC 81 MG PO TBEC
81.0000 mg | DELAYED_RELEASE_TABLET | Freq: Every day | ORAL | Status: DC
  Administered 2021-01-29 – 2021-02-20 (×23): 81 mg via ORAL
  Filled 2021-01-28 (×25): qty 1

## 2021-01-28 MED ORDER — FREE WATER
200.0000 mL | Status: DC
  Administered 2021-01-28 – 2021-01-31 (×16): 200 mL

## 2021-01-28 MED ORDER — BISACODYL 10 MG RE SUPP
10.0000 mg | Freq: Every day | RECTAL | Status: DC | PRN
  Administered 2021-02-10 – 2021-02-16 (×2): 10 mg via RECTAL
  Filled 2021-01-28 (×3): qty 1

## 2021-01-28 MED ORDER — TRAZODONE HCL 50 MG PO TABS
25.0000 mg | ORAL_TABLET | Freq: Every evening | ORAL | Status: DC | PRN
  Administered 2021-01-29 – 2021-02-06 (×4): 50 mg via ORAL
  Filled 2021-01-28 (×7): qty 1

## 2021-01-28 MED ORDER — PROCHLORPERAZINE 25 MG RE SUPP: 12.5000 mg | Freq: Four times a day (QID) | RECTAL | Status: DC | PRN

## 2021-01-28 MED ORDER — AMLODIPINE BESYLATE 5 MG PO TABS
5.0000 mg | ORAL_TABLET | Freq: Every day | ORAL | Status: DC
  Administered 2021-01-29 – 2021-02-09 (×12): 5 mg via ORAL
  Filled 2021-01-28 (×12): qty 1

## 2021-01-28 MED ORDER — PROSOURCE TF PO LIQD
45.0000 mL | Freq: Two times a day (BID) | ORAL | Status: DC
  Administered 2021-01-28 – 2021-02-01 (×8): 45 mL
  Filled 2021-01-28 (×8): qty 45

## 2021-01-28 MED ORDER — FLEET ENEMA 7-19 GM/118ML RE ENEM
1.0000 | ENEMA | Freq: Once | RECTAL | Status: AC | PRN
  Administered 2021-02-16: 1 via RECTAL
  Filled 2021-01-28: qty 1

## 2021-01-28 MED ORDER — OXYCODONE HCL ER 10 MG PO T12A
10.0000 mg | EXTENDED_RELEASE_TABLET | Freq: Two times a day (BID) | ORAL | Status: DC
  Administered 2021-01-28 – 2021-02-15 (×36): 10 mg via ORAL
  Filled 2021-01-28 (×36): qty 1

## 2021-01-28 NOTE — H&P (Signed)
Physical Medicine and Rehabilitation Admission H&P    Chief Complaint  Patient presents with   Back Pain  : HPI: This is a 55 year old male with a history of diabetes, hypertension, CAD with CABG in 2017, and chronic pain.  He presented to [facility] on July 13 with 5 days of increasing back pain radiating to both lower extremities.  History is also notable for a lumbar laminectomy in late June for L3-S1 discitis and associated psoas abscess.  The plan was for daptomycin through August 10 for MRSA coverage.  Upon readmission in July lumbarized showed multiple fluid collections at L4-L5.  Neurosurgery was consulted in did not recommend surgical intervention.  TEE negative for vegetation.  Interventional radiology drained 5 cc of fluid at the L3 level.  Patient was placed on vancomycin and cefazolin on July 21.  Recent blood cultures are negative and white blood cell counts are improving.  Serial imaging has demonstrated decrease in abscess size.  Patient was receiving core track feeds for some time but now has begun on p.o. diet.  Patient has been mobilized by therapies and demonstrated significant functional deficits in regards to self-care and basic mobility.  He was felt that he would benefit from an inpatient rehab stay to maximize functional gains while medical issues were managed in the hospital setting.  ROS Past Medical History:  Diagnosis Date   CAD (coronary artery disease)    DM (diabetes mellitus) ([place])    Hyperlipidemia    Hypertension    Past Surgical History:  Procedure Laterality Date   LUMBAR LAMINECTOMY FOR EPIDURAL ABSCESS N/A 12/21/2020   Procedure: Thoracic seven-eight, Thoracic twelve-Lumbar one, Lumbar three-four laminectomy for Epidural Abscess;  Surgeon: Dawley, Theodoro Doing, DO;  Location: [place];  Service: Neurosurgery;  Laterality: N/A;   TEE WITHOUT CARDIOVERSION N/A 01/11/2021   Procedure: TRANSESOPHAGEAL ECHOCARDIOGRAM (TEE);  Surgeon: Lelon Perla, MD;   Location: [facility] ENDOSCOPY;  Service: Cardiovascular;  Laterality: N/A;   History reviewed. No pertinent family history. Social History:  reports that he has been smoking cigarettes. He started smoking about 25 years ago. He has been smoking an average of 2 packs per day. He has never used smokeless tobacco. He reports previous alcohol use. He reports that he does not use drugs. Allergies: No Known Allergies Medications Prior to Admission  Medication Sig Dispense Refill   acetaminophen (TYLENOL) 325 MG tablet Take 2 tablets (650 mg total) by mouth every 6 (six) hours as needed for mild pain (or Fever >/= 101). 30 tablet 0   ALPRAZolam (XANAX) 0.25 MG tablet Take 0.25 mg by mouth 2 (two) times daily as needed for anxiety.     aspirin EC 81 MG tablet Take 81 mg by mouth daily. Swallow whole.     atorvastatin (LIPITOR) 40 MG tablet Take 1 tablet (40 mg total) by mouth at bedtime. 30 tablet 0   dapagliflozin propanediol (FARXIGA) 10 MG TABS tablet Take 10 mg by mouth every morning.     daptomycin (CUBICIN) IVPB Inject 750 mg into the vein daily. Indication:  MRSA bacteremia/lumbar osteomyelitis  First Dose: Yes Last Day of Therapy:  02/02/21 Labs - Once weekly:  CBC/D, BMP, and CPK Labs - Every other week:  ESR and CRP Method of administration: IV Push Method of administration may be changed at the discretion of home infusion pharmacist based upon assessment of the patient and/or caregiver's ability to self-administer the medication ordered. 41 Units 0   diclofenac Sodium (VOLTAREN) 1 %  GEL Apply 1 application topically 4 (four) times daily as needed (pain).     furosemide (LASIX) 20 MG tablet Take 20 mg by mouth every morning.     gabapentin (NEURONTIN) 100 MG capsule Take 2 capsules (200 mg total) by mouth 3 (three) times daily. 180 capsule 0   glipiZIDE (GLUCOTROL) 5 MG tablet Take 5 mg by mouth 2 (two) times daily.     icosapent Ethyl (VASCEPA) 1 g capsule Take 2 g by mouth 2 (two) times daily.      lactulose (CHRONULAC) 10 GM/15ML solution Take 15 mLs (10 g total) by mouth daily. 236 mL 0   lidocaine (LIDODERM) 5 % Place 1 patch onto the skin daily as needed (pain). Remove & Discard patch within 12 hours or as directed by MD     losartan (COZAAR) 25 MG tablet Take 25 mg by mouth every morning.     metFORMIN (GLUCOPHAGE) 500 MG tablet Take 500-1,000 mg by mouth See admin instructions. Take 2 tablets (1000 mg) by mouth every morning and 1 tablet (500 mg) at night     methocarbamol (ROBAXIN) 500 MG tablet Take 1 tablet (500 mg total) by mouth every 6 (six) hours as needed for muscle spasms. 20 tablet 0   metoprolol tartrate (LOPRESSOR) 25 MG tablet Take 25 mg by mouth at bedtime.     omeprazole (PRILOSEC) 20 MG capsule Take 20 mg by mouth every morning.     oxyCODONE (OXYCONTIN) 20 mg 12 hr tablet Take 1 tablet (20 mg total) by mouth every 12 (twelve) hours. 14 tablet 0   oxyCODONE (ROXICODONE) 15 MG immediate release tablet Take 1 tablet (15 mg total) by mouth every 3 (three) hours as needed for severe pain ((score 7 to 10)). 30 tablet 0   polyethylene glycol (MIRALAX / GLYCOLAX) 17 g packet Take 17 g by mouth 2 (two) times daily. 14 each 0   venlafaxine XR (EFFEXOR-XR) 75 MG 24 hr capsule Take 75 mg by mouth every morning.      Drug Regimen Review  Drug regimen was reviewed and remains appropriate with no significant issues identified  Home: Home Living Family/patient expects to be discharged to:: Private residence Living Arrangements: Spouse/significant other Available Help at Discharge: Family, Available 24 hours/day Type of Home: House Home Access: Stairs to enter [employer] of Steps: 4 Entrance Stairs-Rails: Can reach both Home Layout: One level Bathroom Shower/Tub: Multimedia programmer: Standard Home Equipment: Grab bars - tub/shower, Civil engineer, contracting - built in, [place] - single point, Environmental consultant - 2 wheels, Bedside commode, Shower seat, Crutches, Wheelchair -  manual   Functional History: Prior Function Level of Independence: Independent Comments: works at [employer]. following most recent admission pt was using RW for mobility and needing supervision for mobility.  Functional Status:  Mobility: Bed Mobility Overal bed mobility: Needs Assistance Bed Mobility: Rolling, Sidelying to Sit Rolling: Supervision Sidelying to sit: HOB elevated, Min assist Supine to sit: Min assist, HOB elevated Sit to supine: Mod assist, HOB elevated Sit to sidelying: Min assist General bed mobility comments: Slowly elevated HOB and allowed pt to rest and adjust. Partial prop onto L elbow with legs supported by therapist for comfort to allow pt to adjust to beginning ascent to sit up EOB, minA to complete transition with pt pulling on therapist's hand. Transfers Overall transfer level: Needs assistance Equipment used: Rolling walker (2 wheeled) Transfers: Sit to/from Stand Sit to Stand: Min assist, +2 safety/equipment Stand pivot transfers: Mod assist  Lateral/Scoot Transfers: Min guard General transfer comment: MinA to power up to stand, +2 for safety. Cues for hand placement. x1 sit to stand from EOB and x1 from recliner.  Pt is very anxious with mobility requesting a second person to assist Ambulation/Gait Ambulation/Gait assistance: Min assist, +2 safety/equipment Gait Distance (Feet): 8 Feet (x2 bouts of ~5 ft > ~8 ft) Assistive device: Rolling walker (2 wheeled) Gait Pattern/deviations: Step-through pattern, Decreased stride length, Trunk flexed, Shuffle, Decreased dorsiflexion - left General Gait Details: Pt taking small, shuffling steps with poor posture. Cues provided to increase feet clearance, primarily on the L, momentary success. MinAx2 for safety and chair follow, no LOB. Gait velocity: reduced Gait velocity interpretation: <1.31 ft/sec, indicative of household ambulator    ADL: ADL Overall ADL's : Needs assistance/impaired Eating/Feeding: Set  up Eating/Feeding Details (indicate cue type and reason): pt with poor appetite Grooming: Wash/dry hands, Wash/dry face, Set up, Bed level Upper Body Bathing: Minimal assistance, Bed level Lower Body Bathing: Maximal assistance, Bed level Upper Body Dressing : Moderate assistance, Bed level Lower Body Dressing: Total assistance, Bed level Lower Body Dressing Details (indicate cue type and reason): pt unable to perform figure 4 Toilet Transfer: Minimal assistance, +2 for safety/equipment, Stand-pivot, BSC, RW Toilet Transfer Details (indicate cue type and reason): unable Toileting- Clothing Manipulation and Hygiene: Maximal assistance, Sit to/from stand Functional mobility during ADLs: Minimal assistance, +2 for safety/equipment, Rolling walker General ADL Comments: Pt severely limited by pain, decreased ability to care for self and decreased safety awareness. Pt sitting at EOB only momentarily before returning to supine due to pain.  Cognition: Cognition Overall Cognitive Status: Impaired/Different from baseline Orientation Level: Oriented X4 Cognition Arousal/Alertness: Awake/alert Behavior During Therapy: WFL for tasks assessed/performed Overall Cognitive Status: Impaired/Different from baseline Area of Impairment: Memory, Following commands, Safety/judgement, Awareness, Attention, Orientation Orientation Level: Disoriented to, Time (difficulty recalling current month) Current Attention Level: Sustained Memory: Decreased recall of precautions, Decreased short-term memory Following Commands: Follows one step commands consistently, Follows one step commands with increased time Safety/Judgement: Decreased awareness of safety, Decreased awareness of deficits Awareness: Emergent General Comments: Pt easily distracted requiring cues to redirect to task he is engaged in.  verbal cues for sequencing and safety  Physical Exam: Blood pressure 121/76, pulse 98, temperature 98.7 F (37.1 C),  temperature source Oral, resp. rate 18, weight 97.4 kg, SpO2 98 %. Physical Exam  Results for orders placed or performed during the hospital encounter of 01/05/21 (from the past 48 hour(s))  Glucose, capillary     Status: Abnormal   Collection Time: 01/26/21  8:26 PM  Result Value Ref Range   Glucose-Capillary 186 (H) 70 - 99 mg/dL    Comment: Glucose reference range applies only to samples taken after fasting for at least 8 hours.  Glucose, capillary     Status: Abnormal   Collection Time: 01/26/21 11:44 PM  Result Value Ref Range   Glucose-Capillary 191 (H) 70 - 99 mg/dL    Comment: Glucose reference range applies only to samples taken after fasting for at least 8 hours.  Renal function panel     Status: Abnormal   Collection Time: 01/27/21  3:25 AM  Result Value Ref Range   Sodium 130 (L) 135 - 145 mmol/L   Potassium 3.8 3.5 - 5.1 mmol/L   Chloride 91 (L) 98 - 111 mmol/L   CO2 30 22 - 32 mmol/L   Glucose, Bld 194 (H) 70 - 99 mg/dL    Comment: Glucose reference  range applies only to samples taken after fasting for at least 8 hours.   BUN 9 6 - 20 mg/dL   Creatinine, Ser 0.48 (L) 0.61 - 1.24 mg/dL   Calcium 8.3 (L) 8.9 - 10.3 mg/dL   Phosphorus 3.7 2.5 - 4.6 mg/dL   Albumin 2.2 (L) 3.5 - 5.0 g/dL   GFR, Estimated >60 >60 mL/min    Comment: (NOTE) Calculated using the CKD-EPI Creatinine Equation (2021)    Anion gap 9 5 - 15    Comment: Performed at [facility] [address] [place], [place] [address]  Hepatic function panel     Status: Abnormal   Collection Time: 01/27/21  3:25 AM  Result Value Ref Range   Total Protein 5.8 (L) 6.5 - 8.1 g/dL   Albumin 2.2 (L) 3.5 - 5.0 g/dL   AST 11 (L) 15 - 41 U/L   ALT 10 0 - 44 U/L   Alkaline Phosphatase 87 38 - 126 U/L   Total Bilirubin 0.5 0.3 - 1.2 mg/dL   Bilirubin, Direct 0.1 0.0 - 0.2 mg/dL   Indirect Bilirubin 0.4 0.3 - 0.9 mg/dL    Comment: Performed at [facility] [address], [place], [place] [address]   CBC with Differential/Platelet     Status: Abnormal   Collection Time: 01/27/21  3:25 AM  Result Value Ref Range   WBC 8.0 4.0 - 10.5 K/uL   RBC 2.69 (L) 4.22 - 5.81 MIL/uL   Hemoglobin 7.6 (L) 13.0 - 17.0 g/dL   HCT 23.9 (L) 39.0 - 52.0 %   MCV 88.8 80.0 - 100.0 fL   MCH 28.3 26.0 - 34.0 pg   MCHC 31.8 30.0 - 36.0 g/dL   RDW 16.9 (H) 11.5 - 15.5 %   Platelets 526 (H) 150 - 400 K/uL   nRBC 0.0 0.0 - 0.2 %   Neutrophils Relative % 70 %   Neutro Abs 5.7 1.7 - 7.7 K/uL   Lymphocytes Relative 16 %   Lymphs Abs 1.3 0.7 - 4.0 K/uL   Monocytes Relative 11 %   Monocytes Absolute 0.9 0.1 - 1.0 K/uL   Eosinophils Relative 3 %   Eosinophils Absolute 0.2 0.0 - 0.5 K/uL   Basophils Relative 0 %   Basophils Absolute 0.0 0.0 - 0.1 K/uL   Immature Granulocytes 0 %   Abs Immature Granulocytes 0.02 0.00 - 0.07 K/uL    Comment: Performed at [facility], [place] [address] [place], [place] [address]  Glucose, capillary     Status: Abnormal   Collection Time: 01/27/21  4:44 AM  Result Value Ref Range   Glucose-Capillary 215 (H) 70 - 99 mg/dL    Comment: Glucose reference range applies only to samples taken after fasting for at least 8 hours.  Glucose, capillary     Status: Abnormal   Collection Time: 01/27/21  7:42 AM  Result Value Ref Range   Glucose-Capillary 154 (H) 70 - 99 mg/dL    Comment: Glucose reference range applies only to samples taken after fasting for at least 8 hours.  Glucose, capillary     Status: Abnormal   Collection Time: 01/27/21 11:24 AM  Result Value Ref Range   Glucose-Capillary 185 (H) 70 - 99 mg/dL    Comment: Glucose reference range applies only to samples taken after fasting for at least 8 hours.  Glucose, capillary     Status: Abnormal   Collection Time: 01/27/21  4:01 PM  Result  Value Ref Range   Glucose-Capillary 149 (H) 70 - 99 mg/dL    Comment: Glucose reference range applies only to samples taken after fasting for at least 8 hours.  Glucose, capillary      Status: None   Collection Time: 01/27/21  9:12 PM  Result Value Ref Range   Glucose-Capillary 93 70 - 99 mg/dL    Comment: Glucose reference range applies only to samples taken after fasting for at least 8 hours.  Vancomycin, peak     Status: None   Collection Time: 01/27/21 10:22 PM  Result Value Ref Range   Vancomycin Pk 33 30 - 40 ug/mL    Comment: Performed at [facility], [place] [address], [place], [place] [address]  Glucose, capillary     Status: Abnormal   Collection Time: 01/27/21 11:44 PM  Result Value Ref Range   Glucose-Capillary 152 (H) 70 - 99 mg/dL    Comment: Glucose reference range applies only to samples taken after fasting for at least 8 hours.   Comment 1 Notify RN    Comment 2 Document in Chart   Glucose, capillary     Status: Abnormal   Collection Time: 01/28/21  4:01 AM  Result Value Ref Range   Glucose-Capillary 156 (H) 70 - 99 mg/dL    Comment: Glucose reference range applies only to samples taken after fasting for at least 8 hours.   Comment 1 Notify RN    Comment 2 Document in Chart   C-reactive protein     Status: Abnormal   Collection Time: 01/28/21  4:45 AM  Result Value Ref Range   CRP 6.8 (H) <1.0 mg/dL    Comment: Performed at [facility] [address], [place], [place] [address]  Vancomycin, trough     Status: Abnormal   Collection Time: 01/28/21  4:45 AM  Result Value Ref Range   Vancomycin Tr 22 (HH) 15 - 20 ug/mL    Comment: CRITICAL RESULT CALLED TO, READ BACK BY AND VERIFIED WITH:  P. KUFFOUR RN $[email or web address]  01/28/21 K. SANDERS Performed at [facility], [place] [address], [place], [place] [address]   Glucose, capillary     Status: Abnormal   Collection Time: 01/28/21  8:06 AM  Result Value Ref Range   Glucose-Capillary 177 (H) 70 - 99 mg/dL    Comment: Glucose reference range applies only to samples taken after fasting for at least 8 hours.  Glucose, capillary     Status: Abnormal   Collection Time: 01/28/21 12:26 PM   Result Value Ref Range   Glucose-Capillary 265 (H) 70 - 99 mg/dL    Comment: Glucose reference range applies only to samples taken after fasting for at least 8 hours.   MR THORACIC SPINE WO CONTRAST  Result Date: 01/27/2021 CLINICAL DATA:  Epidural abscess EXAM: MRI THORACIC AND LUMBAR SPINE WITHOUT CONTRAST TECHNIQUE: Multiplanar and multiecho pulse sequences of the thoracic and lumbar spine were obtained without intravenous contrast. COMPARISON:  01/05/2021 FINDINGS: MRI THORACIC SPINE FINDINGS Alignment:  Physiologic. Vertebrae: Hyperintense T2-weighted signal within the T7 and T8 vertebral bodies. There is posterior decompression at the same level. Cord: There is mild hyperintense T2-weighted signal within the central spinal cord at the T6-7 and T8-T10 levels, possibly a prominent central canal. Thin dorsal epidural collection extends from T5-6 through T8-9 Paraspinal and other soft tissues: Large pleural effusions Disc levels: Small disc bulge with endplate spurring at Q3-3 causing mild spinal canal stenosis. Moderate bilateral foraminal  stenosis MRI LUMBAR SPINE FINDINGS Segmentation:  Standard. Alignment:  Physiologic. Vertebrae:  Slight progression of signal changes at L3-5. Conus medullaris and cauda equina: Conus extends to the L1 level. Ventral epidural collection at L2-3 to L5-S1, measuring 5 mm in thickness. Paraspinal and other soft tissues: There are multiple bilateral psoas collections, the largest of which measures 3.4 cm on the left. Dorsal paraspinal collection less clearly characterized without IV contrast. Disc levels: L1-L2: Normal disc space and facet joints. No spinal canal stenosis. No neural foraminal stenosis. L2-L3: Normal disc space and facet joints. No spinal canal stenosis. No neural foraminal stenosis. L3-L4: Persistent disc space edema. Status post left laminectomy. Central spinal canal narrowing due to ventral epidural collection has progressed from the prior study. The  neural foramina are patent. L4-L5: Persistent disc space edema. Ventral epidural collection with mild narrowing of central spinal canal. Moderate right and severe left neural stenosis unchanged L5-S1: Small disc bulge with ventral epidural collection. No spinal canal stenosis. Severe right and moderate left neural foraminal stenosis. Visualized sacrum: Normal. IMPRESSION: 1. Signal abnormality within the vertebral bodies and disc spaces at L3-4 and L4-5, consistent with discitis-osteomyelitis. Persistent ventral epidural collection at L2-S1 that measures 5 mm in thickness. 2. 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 3. Multiple bilateral psoas abscesses. 4. Bilateral pleural effusions. 5. Moderate-to-severe bilateral neural foraminal stenosis at L4-5 and L5-S1. Electronically Signed   By: Ulyses Jarred M.D.   On: 01/27/2021 21:35   MR LUMBAR SPINE WO CONTRAST  Result Date: 01/27/2021 CLINICAL DATA:  Epidural abscess EXAM: MRI THORACIC AND LUMBAR SPINE WITHOUT CONTRAST TECHNIQUE: Multiplanar and multiecho pulse sequences of the thoracic and lumbar spine were obtained without intravenous contrast. COMPARISON:  01/05/2021 FINDINGS: MRI THORACIC SPINE FINDINGS Alignment:  Physiologic. Vertebrae: Hyperintense T2-weighted signal within the T7 and T8 vertebral bodies. There is posterior decompression at the same level. Cord: There is mild hyperintense T2-weighted signal within the central spinal cord at the T6-7 and T8-T10 levels, possibly a prominent central canal. Thin dorsal epidural collection extends from T5-6 through T8-9 Paraspinal and other soft tissues: Large pleural effusions Disc levels: Small disc bulge with endplate spurring at Y7-8 causing mild spinal canal stenosis. Moderate bilateral foraminal stenosis MRI LUMBAR SPINE FINDINGS Segmentation:  Standard. Alignment:  Physiologic. Vertebrae:  Slight progression of signal  changes at L3-5. Conus medullaris and cauda equina: Conus extends to the L1 level. Ventral epidural collection at L2-3 to L5-S1, measuring 5 mm in thickness. Paraspinal and other soft tissues: There are multiple bilateral psoas collections, the largest of which measures 3.4 cm on the left. Dorsal paraspinal collection less clearly characterized without IV contrast. Disc levels: L1-L2: Normal disc space and facet joints. No spinal canal stenosis. No neural foraminal stenosis. L2-L3: Normal disc space and facet joints. No spinal canal stenosis. No neural foraminal stenosis. L3-L4: Persistent disc space edema. Status post left laminectomy. Central spinal canal narrowing due to ventral epidural collection has progressed from the prior study. The neural foramina are patent. L4-L5: Persistent disc space edema. Ventral epidural collection with mild narrowing of central spinal canal. Moderate right and severe left neural stenosis unchanged L5-S1: Small disc bulge with ventral epidural collection. No spinal canal stenosis. Severe right and moderate left neural foraminal stenosis. Visualized sacrum: Normal. IMPRESSION: 1. Signal abnormality within the vertebral bodies and disc spaces at L3-4 and L4-5, consistent with discitis-osteomyelitis. Persistent ventral epidural collection at L2-S1 that measures 5 mm in  thickness. 2. 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 3. Multiple bilateral psoas abscesses. 4. Bilateral pleural effusions. 5. Moderate-to-severe bilateral neural foraminal stenosis at L4-5 and L5-S1. Electronically Signed   By: Ulyses Jarred M.D.   On: 01/27/2021 21:35       Medical Problem List and Plan: 1.  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   -patient may shower  -ELOS/Goals: 12 to 15 days, modified independent goals with PT and OT 2.   Antithrombotics: -DVT/anticoagulation:  Pharmaceutical: Lovenox  -antiplatelet therapy: Baby aspirin daily 3. Pain Management: OxyContin 10 mg every 12 hours with oxycodone 5 mg every 4 hours as needed  -Robaxin 500 mg every 6 hours as needed for spasms  -Voltaren gel for topical relief  -Add K pad for muscle spasm and low back pain  -Tylenol for mild pain 4. Mood: Team to provide ego support  -continue Effexor XR 75 mg daily  -antipsychotic agents: N/A, had been haldol before 5. Neuropsych: This patient is capable of making decisions on his own behalf. -Patient's mental status appears to be improving  6. Skin/Wound Care: Maximize nutritional intake, local skin care as needed 7. Fluids/Electrolytes/Severe Protein malutrition: Patient now is on regular diet.  Encourage p.o. intake.  -Supplementing with core track feeds, reduce to stimulate PO  -Monitor nutritional status closely 8.  Sepsis secondary to discitis and epidural abscess/bacteremia  -IV vancomycin and sertraline until today, then to IV vancomycin alone for 6 weeks  -Monitor clinically and with regular labs 9.  Uncontrolled type 2 diabetes  -Lantus insulin 5 units with sliding scale insulin coverage  -Check CBGs before meals and at bedtime 10.  Chronic diastolic congestive heart failure  -Monitor daily weights  -Anasarca most likely due to nutritional status as opposed to cardiac  -Recent 2D echo demonstrated no wall abnormalities 11.  Hyponatremia  -Likely due to third spacing  -Lasix resumed at low-dose 12. Anemia d/t illness 13. Thrombocytosis        Meredith Staggers, MD 01/28/2021

## 2021-01-28 NOTE — Progress Notes (Signed)
Inpatient Rehabilitation  Patient information reviewed and entered into eRehab system by Marrion Accomando M. Dearis Danis, M.A., CCC/SLP, PPS Coordinator.  Information including medical coding, functional ability and quality indicators will be reviewed and updated through discharge.    

## 2021-01-28 NOTE — Progress Notes (Signed)
Pt discharged to CIR. Room belongings, extra tube feeds, 8pm vancomycin, and vancomycin script taken over. Pt taken to 4W02 in [facility] bed.   Robina Ade, RN

## 2021-01-28 NOTE — Progress Notes (Signed)
Inpatient Rehab Admissions Coordinator:   Met with patient and his spouse at the bedside to discuss CIR recommendations.  Pt reports 10/10 pain and per PT, RN will come give meds as soon as it's time.  We discussed estimated length of stay to be a few weeks, dependent on progress may be able to leave earlier than that.  Goals would be supervision to mod I.  Wife is home 24/7 and can provide assist.  I reviewed need for insurance authorization and have sent the info to be opened.  Will follow for potential admission pending approval and bed availability.   Shann Medal, PT, DPT Admissions Coordinator [phone] 01/28/21  12:23 PM

## 2021-01-28 NOTE — Progress Notes (Signed)
Pharmacy Antibiotic Note  Louis [name] is a 55 y.o. male admitted on 01/05/2021 with epidural abscess with MRSA bacteremia/lumbar osteomyelitis for Vancomycin.  Scr has been stable at < 1. Vancomycin levels were collected after his dose last night. Vancomycin peak resulted as 33 - Drawn about 2 hours early. Vancomycin trough resulted as 22 but was drawn about 3.5 hours early. Resulting AUC was around 551, which is within the therapeutic range. Will continue with a slightly aggressive dosing regimen given persistent bacteremia.   Plan: Will continue Vancomycin 1500 mg q 12 hours and plan repeat levels next week.  Monitor renal function at least q 72 hours- Next ordered for Monday Follow clinical progress.    Weight: 97.4 kg (214 lb 11.7 oz)  Temp (24hrs), Avg:97.9 F (36.6 C), Min:97.8 F (36.6 C), Max:98 F (36.7 C)  Recent Labs  Lab 01/22/21 0502 01/24/21 2143 01/25/21 0247 01/25/21 0518 01/27/21 0325 01/27/21 2222 01/28/21 0445  WBC 11.8*  --  12.2*  --  8.0  --   --   CREATININE 0.44*  --  0.52*  --  0.48*  --   --   VANCOTROUGH  --    < >  --  18  --   --  22*  VANCOPEAK  --   --   --   --   --  33  --    < > = values in this interval not displayed.     Estimated Creatinine Clearance: 121.2 mL/min (A) (by C-G formula based on SCr of 0.48 mg/dL (L)).    No Known Allergies  Cubicin PTA >> 7/14 (initial plan through 8/10, last OPAT 7/13); 7/20 >>7/21 Ceftaroline 7/20 >> (8/5) Vanc 7/14 >>7/20; 7/21>> Fluc 7/17>> 7/23 for oral thrush   7/14 CK = 32  7/18 VP 22, VT 9 (drawn late), AUC 385 on 1500 q12 >> incr 1250 q8 7/23: VP 28, VT 21,  AUC 575 >> change to 1750/12h for AUC 536 7/26-27: VP 31, VT 14 (drawn early), AUC 523 >> con't 1750mg  q12 8/4-8/5: VP 33, VT 22 ( both drawn early)- AUC 551>>Continue 1500 q 12 for now    7/13 BCx - 3/4 MRSA 7/15 Bcx: staph aureus 7/16 wound cx - ngtd 7/17 Bcx- 3/4 MRSA 7/19 Bcx: MRSA 7/20 Bcx: staph aureus 7/22 Bcx - negative 7/24  Bcx - NGF 7/26 Bcx - NGF  8/26, PharmD, BCPS, BCIDP Infectious Diseases Clinical Pharmacist Phone: [phone] 01/28/2021, 7:42 AM

## 2021-01-28 NOTE — Progress Notes (Signed)
Inpatient Rehab Admissions Coordinator:    I have insurance approval and a bed available for pt to admit to CIR today. Dr. Mahala Menghini in agreement.  Will let pt/family and TOC team know.   Estill Dooms, PT, DPT Admissions Coordinator [phone] 01/28/21  4:55 PM

## 2021-01-28 NOTE — Progress Notes (Signed)
Inpatient Rehabilitation Medication Review by a Pharmacist  A complete drug regimen review was completed for this patient to identify any potential clinically significant medication issues.  Clinically significant medication issues were identified:  no  Check AMION for pharmacist assigned to patient if future medication questions/issues arise during this admission.  Pharmacist comments:   Time spent performing this drug regimen review (minutes):  20   Reiley Keisler 01/28/2021 7:23 PM

## 2021-01-28 NOTE — Progress Notes (Signed)
[facility] PROGRESS NOTE    Progress Note  Louis [name]  ZOX:[id number] DOB: 09/14/1965 DOA: 01/05/2021 PCP: Bobette MoBlackburn, Helen, NP     Brief Narrative:   55 y.o. male  DM TY 2-A1c recently 9 HTN CABG 2017 chronic pain  Recent osteomyelitis 6/19-12/26/2020 discitis L3-S1 status post laminectomy 6/28 left psoas abscess I&D on 6/29 --then evacuation abscess 12/21/2020--ID recommended daptomycin through 8/10 for MRSA growing on blood culture and TTE was negative Readmit 7/14 with 5 days of worsening back pain severe constant bilateral radiation MRI of the lumbar spine showed multiple fluid collections and L4-L5, neurosurgery felt no need for surgical intervention as he was not neurological compromise.  Daptomycin MIC was not susceptible on lab from 01/06/2019 to  clarify inability clearing the infection.  Negative agitation TEE this admission  IR drained 5 mL L3 level subdural fluid collection.  White blood cell count is improving on vancomycin and cefazolin started on 01/13/2021--Persistent bacteremia with Blood cultures on 01/05/2021, 01/08/2019, 01/09/2021, 01/11/2021, 01/12/2021 positive for MRSA.   Blood cultures repeated on 01/14/2021, 01/16/2021 and 01/18/2021 have remained negative till date this been on IV vancomycin and Teflaro His MRI has been repeated recently which shows a decrease in the size of abscess and he is starting to consume enough oral calories and for consideration of removal of core track-CIR interested in rehabbing him and he will discharge there over the next several days  Pertinent events: 12/21/2020 Status postlaminectomy  12/22/2020 IR consulted and placed a drain in the iliopsoas abscess drain 7/13, 7/14 and 7/17 blood cultures positive for MRSA 01/06/2021 neurosurgery was consulted and since the patient was neurologically intact they do not feel he needs emergent decompression. 01/11/2021 TEE negative for vegetation. 01/12/2021 CT scan of the abdomen pelvis showed progressive  anasarca progressive erosion of L3-L4 with increasing paraspinal inflammatory collections adjacent to L3-L5 without drainable fluid at this time. 01/14/2021 blood cultures negative till date.  Assessment/Plan:   Sepsis and secondary to discitis and epidural abscess/discitis:  IV vancomycin and certraline until 01/28/2021 and switch back to IV vancomycin alone for 6 weeks.  MRI w/without contrast ordered 01/29/2019 shows decreasing size of the area of the abscess and patient is overall improving  Persistent MRSA bacteremia: ID recommended to continue IV vancomycin and ceftaroline Ceftolozane/vancomycin-->01/28/2021 and then continue IV vancomycin alone to complete further therapy Periodic labs he looks well  Acute metabolic encephalopathy??  Cognitive impairment Became encephalopathic, likely multifactorial in the setting of opioids, metabolic and infectious etiology--this is improved significantly  MRI of the brain showed no acute findings. Currently on Haldol 2 mg every 6 as needed agitation Effexor 75 mg AM  Severe protein caloric malnutrition/anasarca: Prealbumin of 5.9. Patient removed core track, several times this admission core track was initially placed on 8/1 Continue enteral feeds until eating about 50% of meals--appreciate RD input with calorie count-patient seems to be improving  Uncontrolled diabetes mellitus type 2: Continue lantus 5, SSI CBG 150s to 260s  Abnormal EKG: Had ST segment elevation V1 V2 without reciprocal changes cardiology was consulted and a 2D echo showed no wall motion abnormalities cardiac biomarkers remain remained flat.  Chronic diastolic heart failure: Appears anasarca on physical exam likely due to low albumin  Probable hypervolemic hyponatremia He is third spacing, his albumin is slowly started to trend down Start back Lasix 20  day iv for now--f keep for now changed to p.o. prior to discharge  Mechanical fall: Patient had a fall on  12/31/2020. CBD CT scan of  the head showed no acute findings.  Continue fall precautions.  Cholestasis: Reactive due to infectious trends complete metabolic panel to be done periodically GERD: Continue Protonix. Oral thrush:Completed 7-day course of Diflucan. Thrombocytosis: Anemia Reactive and secondary to infection   DVT prophylaxis: lovenox Family Communication: Wife updated at the bedside in detail every time I have seen her Status is: Inpatient  Remains inpatient appropriate because:Hemodynamically unstable  Dispo: The patient is from: Home              Anticipated d/c is to: CIR              Patient currently is not medically stable to d/c.   Difficult to place patient No    Code Status:     Code Status Orders  (From admission, onward)           Start     Ordered   01/06/21 0224  Full code  Continuous        01/06/21 0226           Code Status History     Date Active Date Inactive Code Status Order ID Comments User Context   12/12/2020 2253 12/26/2020 2008 Full Code [id number]  Synetta Fail, MD ED         IV Access:   Peripheral IV   Procedures and diagnostic studies:   MR THORACIC SPINE WO CONTRAST  Result Date: 01/27/2021 CLINICAL DATA:  Epidural abscess EXAM: MRI THORACIC AND LUMBAR SPINE WITHOUT CONTRAST TECHNIQUE: Multiplanar and multiecho pulse sequences of the thoracic and lumbar spine were obtained without intravenous contrast. COMPARISON:  01/05/2021 FINDINGS: MRI THORACIC SPINE FINDINGS Alignment:  Physiologic. Vertebrae: Hyperintense T2-weighted signal within the T7 and T8 vertebral bodies. There is posterior decompression at the same level. Cord: There is mild hyperintense T2-weighted signal within the central spinal cord at the T6-7 and T8-T10 levels, possibly a prominent central canal. Thin dorsal epidural collection extends from T5-6 through T8-9 Paraspinal and other soft tissues: Large pleural effusions Disc levels: Small disc bulge  with endplate spurring at T7-8 causing mild spinal canal stenosis. Moderate bilateral foraminal stenosis MRI LUMBAR SPINE FINDINGS Segmentation:  Standard. Alignment:  Physiologic. Vertebrae:  Slight progression of signal changes at L3-5. Conus medullaris and cauda equina: Conus extends to the L1 level. Ventral epidural collection at L2-3 to L5-S1, measuring 5 mm in thickness. Paraspinal and other soft tissues: There are multiple bilateral psoas collections, the largest of which measures 3.4 cm on the left. Dorsal paraspinal collection less clearly characterized without IV contrast. Disc levels: L1-L2: Normal disc space and facet joints. No spinal canal stenosis. No neural foraminal stenosis. L2-L3: Normal disc space and facet joints. No spinal canal stenosis. No neural foraminal stenosis. L3-L4: Persistent disc space edema. Status post left laminectomy. Central spinal canal narrowing due to ventral epidural collection has progressed from the prior study. The neural foramina are patent. L4-L5: Persistent disc space edema. Ventral epidural collection with mild narrowing of central spinal canal. Moderate right and severe left neural stenosis unchanged L5-S1: Small disc bulge with ventral epidural collection. No spinal canal stenosis. Severe right and moderate left neural foraminal stenosis. Visualized sacrum: Normal. IMPRESSION: 1. Signal abnormality within the vertebral bodies and disc spaces at L3-4 and L4-5, consistent with discitis-osteomyelitis. Persistent ventral epidural collection at L2-S1 that measures 5 mm in thickness. 2. 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 3. Multiple bilateral psoas abscesses. 4. Bilateral pleural  effusions. 5. Moderate-to-severe bilateral neural foraminal stenosis at L4-5 and L5-S1. Electronically Signed   By: Deatra Robinson M.D.   On: 01/27/2021 21:35   MR LUMBAR SPINE WO CONTRAST  Result Date:  01/27/2021 CLINICAL DATA:  Epidural abscess EXAM: MRI THORACIC AND LUMBAR SPINE WITHOUT CONTRAST TECHNIQUE: Multiplanar and multiecho pulse sequences of the thoracic and lumbar spine were obtained without intravenous contrast. COMPARISON:  01/05/2021 FINDINGS: MRI THORACIC SPINE FINDINGS Alignment:  Physiologic. Vertebrae: Hyperintense T2-weighted signal within the T7 and T8 vertebral bodies. There is posterior decompression at the same level. Cord: There is mild hyperintense T2-weighted signal within the central spinal cord at the T6-7 and T8-T10 levels, possibly a prominent central canal. Thin dorsal epidural collection extends from T5-6 through T8-9 Paraspinal and other soft tissues: Large pleural effusions Disc levels: Small disc bulge with endplate spurring at T7-8 causing mild spinal canal stenosis. Moderate bilateral foraminal stenosis MRI LUMBAR SPINE FINDINGS Segmentation:  Standard. Alignment:  Physiologic. Vertebrae:  Slight progression of signal changes at L3-5. Conus medullaris and cauda equina: Conus extends to the L1 level. Ventral epidural collection at L2-3 to L5-S1, measuring 5 mm in thickness. Paraspinal and other soft tissues: There are multiple bilateral psoas collections, the largest of which measures 3.4 cm on the left. Dorsal paraspinal collection less clearly characterized without IV contrast. Disc levels: L1-L2: Normal disc space and facet joints. No spinal canal stenosis. No neural foraminal stenosis. L2-L3: Normal disc space and facet joints. No spinal canal stenosis. No neural foraminal stenosis. L3-L4: Persistent disc space edema. Status post left laminectomy. Central spinal canal narrowing due to ventral epidural collection has progressed from the prior study. The neural foramina are patent. L4-L5: Persistent disc space edema. Ventral epidural collection with mild narrowing of central spinal canal. Moderate right and severe left neural stenosis unchanged L5-S1: Small disc bulge with  ventral epidural collection. No spinal canal stenosis. Severe right and moderate left neural foraminal stenosis. Visualized sacrum: Normal. IMPRESSION: 1. Signal abnormality within the vertebral bodies and disc spaces at L3-4 and L4-5, consistent with discitis-osteomyelitis. Persistent ventral epidural collection at L2-S1 that measures 5 mm in thickness. 2. 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 3. Multiple bilateral psoas abscesses. 4. Bilateral pleural effusions. 5. Moderate-to-severe bilateral neural foraminal stenosis at L4-5 and L5-S1. Electronically Signed   By: Deatra Robinson M.D.   On: 01/27/2021 21:35     Medical Consultants:   Infectious disease infectious disease   Subjective:   Interactive pleasant no distress no further left leg pain Does have bilateral lower extremity edema 2+ assist out of bed with therapy No chest pain fever chills nausea vomiting-spirits seem improved  Objective:    Vitals:   01/27/21 2319 01/28/21 0327 01/28/21 0800 01/28/21 1159  BP: 108/74 118/73 121/81 (!) 134/92  Pulse: 87 83 90 97  Resp: 20 16 19 17   Temp: 98 F (36.7 C) 97.8 F (36.6 C) 97.6 F (36.4 C) 97.9 F (36.6 C)  TempSrc: Oral Oral Axillary Oral  SpO2: 98% 99% 100% 100%  Weight:       SpO2: 100 % O2 Flow Rate (L/min): 2 L/min   Intake/Output Summary (Last 24 hours) at 01/28/2021 1515 Last data filed at 01/28/2021 1225 Gross per 24 hour  Intake 4534.5 ml  Output 1400 ml  Net 3134.5 ml    Filed Weights   01/09/21 1700 01/13/21 1436 01/24/21 1317  Weight: 92.9 kg 94.3 kg 97.4 kg  Exam: Coherent pleasant EOMI NCAT core track in place thick beard Chest clear no added sound rales rhonchi Abdomen soft no rebound smiling umbilicus 2+ lower extremity edema Chest clear S1-S2 no murmur  Data Reviewed:    Labs: Basic Metabolic Panel: Recent Labs  Lab 01/22/21 0502 01/25/21 0247  01/27/21 0325  NA 132* 129* 130*  K 3.6 3.5 3.8  CL 98 94* 91*  CO2 26 28 30   GLUCOSE 134* 140* 194*  BUN 7 7 9   CREATININE 0.44* 0.52* 0.48*  CALCIUM 8.3* 8.0* 8.3*  PHOS  --   --  3.7    GFR Estimated Creatinine Clearance: 121.2 mL/min (A) (by C-G formula based on SCr of 0.48 mg/dL (L)). Liver Function Tests: Recent Labs  Lab 01/22/21 0502 01/25/21 0247 01/27/21 0325  AST 14* 10* 11*  ALT 10 8 10   ALKPHOS 74 71 87  BILITOT 0.9 0.5 0.5  PROT 6.1* 5.5* 5.8*  ALBUMIN 2.5* 2.2* 2.2*  2.2*    No results for input(s): LIPASE, AMYLASE in the last 168 hours. No results for input(s): AMMONIA in the last 168 hours. Coagulation profile No results for input(s): INR, PROTIME in the last 168 hours. COVID-19 Labs  Recent Labs    01/28/21 0445  CRP 6.8*     Lab Results  Component Value Date   SARSCOV2NAA NEGATIVE 01/05/2021   SARSCOV2NAA NEGATIVE 12/12/2020    CBC: Recent Labs  Lab 01/22/21 0502 01/25/21 0247 01/27/21 0325  WBC 11.8* 12.2* 8.0  NEUTROABS 9.0* 9.4* 5.7  HGB 8.8* 7.5* 7.6*  HCT 26.8* 23.0* 23.9*  MCV 86.7 88.5 88.8  PLT 463* 472* 526*    Cardiac Enzymes: No results for input(s): CKTOTAL, CKMB, CKMBINDEX, TROPONINI in the last 168 hours. BNP (last 3 results) No results for input(s): PROBNP in the last 8760 hours. CBG: Recent Labs  Lab 01/27/21 2112 01/27/21 2344 01/28/21 0401 01/28/21 0806 01/28/21 1226  GLUCAP 93 152* 156* 177* 265*    D-Dimer: No results for input(s): DDIMER in the last 72 hours. Hgb A1c: No results for input(s): HGBA1C in the last 72 hours. Lipid Profile: No results for input(s): CHOL, HDL, LDLCALC, TRIG, CHOLHDL, LDLDIRECT in the last 72 hours. Thyroid function studies: No results for input(s): TSH, T4TOTAL, T3FREE, THYROIDAB in the last 72 hours.  Invalid input(s): FREET3 Anemia work up: No results for input(s): VITAMINB12, FOLATE, FERRITIN, TIBC, IRON, RETICCTPCT in the last 72 hours.  Sepsis  Labs: Recent Labs  Lab 01/22/21 0502 01/25/21 0247 01/27/21 0325  WBC 11.8* 12.2* 8.0    Microbiology No results found for this or any previous visit (from the past 240 hour(s)).    Medications:    (feeding supplement) PROSource Plus  30 mL Oral TID BM   amLODipine  5 mg Oral Daily   aspirin EC  81 mg Oral Daily   atorvastatin  40 mg Oral QHS   diclofenac Sodium  2 g Topical QID   enoxaparin (LOVENOX) injection  40 mg Subcutaneous QHS   feeding supplement  1 Container Oral TID BM   feeding supplement (GLUCERNA 1.5 CAL)  1,000 mL Per Tube Q24H   feeding supplement (PROSource TF)  45 mL Per Tube BID   folic acid  1 mg Oral Daily   free water  200 mL Per Tube Q4H   furosemide  20 mg Intravenous Daily   insulin aspart  0-15 Units Subcutaneous Q4H   insulin aspart  4 Units Subcutaneous TID WC   insulin glargine  5 Units Subcutaneous Daily   losartan  25 mg Oral q morning   mouth rinse  15 mL Mouth Rinse BID   melatonin  3 mg Oral QHS   metoprolol succinate  25 mg Oral Daily   multivitamin with minerals  1 tablet Oral Daily   oxyCODONE  10 mg Oral Q12H   pantoprazole  40 mg Oral BID   psyllium  1 packet Oral Daily   venlafaxine XR  75 mg Oral q morning   Continuous Infusions:  vancomycin 1,500 mg (01/28/21 0832)      LOS: 22 days   Jai-Gurmukh Toneshia Coello  [facility]  01/28/2021, 3:15 PM

## 2021-01-28 NOTE — Progress Notes (Signed)
Calorie Count Note  48-hour calorie count ordered.  Diet: regular, thin liquids Supplements:  - Magic Cup TID with meals - Boost Breeze TID - ProSource Plus po TID  Day 1: 8/03 Dinner: 460 kcal, 33 grams of protein 8/04 Breakfast: 300 kcal, 5 grams of protein 8/04 Lunch: 200 kcal, 18 grams of protein Supplements: 225 kcal, 20 grams of protein Snacks: 360 kcal, 15 grams of protein  Day 1 total 24-hour intake: 1545 kcal (67% of minimum estimated needs)  91 grams of protein (79% of minimum estimated needs)   Day 2: 8/04 Dinner: 0 kcal, 0 grams of protein (pt in MRI for 2+ hours and missed dinner) 8/05 Breakfast: 510 kcal, 11 grams of protein 8/05 Lunch: 0 kcal, 0 grams of protein (pt reports that he may order lunch later) Supplements: 100 kcal, 15 grams of protein  Day 2 total 24-hour intake: 610 kcal (27% of minimum estimated needs)  26 grams of protein (23% of minimum estimated needs)  Nutrition Diagnosis: Severe Malnutrition related to acute illness (MRSA bacteremia) as evidenced by mild fat depletion, moderate muscle depletion, energy intake < or equal to 50% for > or equal to 5 days.  Goal: Patient will meet greater than or equal to 90% of their needs  Intervention: - d/c 48-hour calorie count - Recommend continuing nocturnal tube feeds due to inconsistent PO intake - Continue Boost Breeze TID - Continue Magic Cup TID - Continue ProSource Plus po BID   Mertie Clause, MS, RD, LDN Inpatient Clinical Dietitian Please see AMiON for contact information.

## 2021-01-28 NOTE — Progress Notes (Signed)
Physical Therapy Treatment Patient Details Name: Louis [name] MRN: [id number] DOB: 02/10/1966 Today's Date: 01/28/2021    History of Present Illness Pt is a 55 y.o. male who presented 7/13 with worsening back pain with recurrent MRSA bacteremia after discharging 7/3 from being admitted with epidural abscess, vertebral osteomyelitis L3-S1, psoas abscess, and MRSA bacteremia with s/p 12/21/20 T7-8, T12-L1, L3-4 laminectomies for evacuation of epidural abscess. 7/13 mri lumbar/thoracic showed several areas of fluid collection with a more loculated focus at L4-5. 7/20 imaging revealed progressive erosive changes involving the L3 and L4 vertebral bodies  in keeping with progressive changes of discitis osteomyelitis. PMH: CAD, hypertension, uncontrolled diabetes mellitus.    PT Comments    Pt is continuing to make good, steady progress towards his PT goals. He was able to ambulate x2 bouts of ~5-8 ft each with a RW and minA, +2 for safety and chair follow, this date. He continues to be limited by pain and dizziness with changes in positions, but his BP appears to be remaining stable and appropriately changing this date. Will continue to follow acutely. Current recommendations remain appropriate.    Follow Up Recommendations  CIR;Supervision/Assistance - 24 hour     Equipment Recommendations  Hospital bed    Recommendations for Other Services       Precautions / Restrictions Precautions Precautions: Back Precaution Booklet Issued: No Precaution Comments: monitor BP; provided cues to maintain during mobility Restrictions Weight Bearing Restrictions: No    Mobility  Bed Mobility Overal bed mobility: Needs Assistance Bed Mobility: Rolling;Sidelying to Sit Rolling: Supervision Sidelying to sit: HOB elevated;Min assist       General bed mobility comments: Slowly elevated HOB and allowed pt to rest and adjust. Partial prop onto L elbow with legs supported by therapist for comfort to allow pt to  adjust to beginning ascent to sit up EOB, minA to complete transition with pt pulling on therapist's hand.    Transfers Overall transfer level: Needs assistance Equipment used: Rolling walker (2 wheeled) Transfers: Sit to/from Stand Sit to Stand: Min assist;+2 safety/equipment         General transfer comment: MinA to power up to stand, +2 for safety. Cues for hand placement. x1 sit to stand from EOB and x1 from recliner  Ambulation/Gait Ambulation/Gait assistance: Min assist;+2 safety/equipment Gait Distance (Feet): 8 Feet (x2 bouts of ~5 ft > ~8 ft) Assistive device: Rolling walker (2 wheeled) Gait Pattern/deviations: Step-through pattern;Decreased stride length;Trunk flexed;Shuffle;Decreased dorsiflexion - left Gait velocity: reduced Gait velocity interpretation: <1.31 ft/sec, indicative of household ambulator General Gait Details: Pt taking small, shuffling steps with poor posture. Cues provided to increase feet clearance, primarily on the L, momentary success. MinAx2 for safety and chair follow, no LOB.   Stairs             Wheelchair Mobility    Modified Rankin (Stroke Patients Only) Modified Rankin (Stroke Patients Only) Pre-Morbid Rankin Score: No symptoms Modified Rankin: Moderately severe disability     Balance Overall balance assessment: Needs assistance Sitting-balance support: Bilateral upper extremity supported;Feet supported;Single extremity supported Sitting balance-Leahy Scale: Poor Sitting balance - Comments: Prefers UE support to reduce pain.   Standing balance support: Bilateral upper extremity supported Standing balance-Leahy Scale: Poor Standing balance comment: Reliant on UE support and external assistance for safe mobility.                            Cognition Arousal/Alertness: Awake/alert Behavior During Therapy: [facility]  for tasks assessed/performed Overall Cognitive Status: Impaired/Different from baseline Area of Impairment:  Memory;Following commands;Safety/judgement;Awareness;Attention;Orientation                 Orientation Level: Disoriented to;Time (difficulty recalling current month) Current Attention Level: Focused Memory: Decreased recall of precautions;Decreased short-term memory Following Commands: Follows one step commands consistently;Follows one step commands with increased time Safety/Judgement: Decreased awareness of safety;Decreased awareness of deficits Awareness: Emergent   General Comments: Pt with improved attention span and ability to follow commands this date. Improved deficits and safety awareness. Still having difficulty recalling the current month.      Exercises General Exercises - Lower Extremity Long Arc Quad: AROM;Both;10 reps;Seated Hip Flexion/Marching: AROM;Both;10 reps;Seated Toe Raises: AROM;Both;10 reps;Seated Heel Raises: AROM;Both;10 reps;Seated    General Comments General comments (skin integrity, edema, etc.): VSS on RA with appropriate BP changes with positional changes this date, still gets dizzy with changes in position though; educated pt to perform HEP on handout and get up with nursing over weekend      Pertinent Vitals/Pain Pain Assessment: Faces Faces Pain Scale: Hurts even more Pain Location: bil LE's with movement Pain Descriptors / Indicators: Grimacing;Guarding;Cramping Pain Intervention(s): Monitored during session;Limited activity within patient's tolerance;Repositioned;Patient requesting pain meds-RN notified    Home Living                      Prior Function            PT Goals (current goals can now be found in the care plan section) Acute Rehab PT Goals Patient Stated Goal: to improve and not be dizzy PT Goal Formulation: With patient/family Time For Goal Achievement: 02/10/21 Potential to Achieve Goals: Good Progress towards PT goals: Progressing toward goals    Frequency    Min 3X/week      PT Plan Frequency  needs to be updated    Co-evaluation PT/OT/SLP Co-Evaluation/Treatment: Yes Reason for Co-Treatment: For patient/therapist safety;To address functional/ADL transfers PT goals addressed during session: Mobility/safety with mobility;Balance        AM-PAC PT "6 Clicks" Mobility   Outcome Measure  Help needed turning from your back to your side while in a flat bed without using bedrails?: A Little Help needed moving from lying on your back to sitting on the side of a flat bed without using bedrails?: A Little Help needed moving to and from a bed to a chair (including a wheelchair)?: A Little Help needed standing up from a chair using your arms (e.g., wheelchair or bedside chair)?: A Little Help needed to walk in hospital room?: A Little Help needed climbing 3-5 steps with a railing? : Total 6 Click Score: 16    End of Session   Activity Tolerance: Other (comment);Patient tolerated treatment well (limited by dizziness) Patient left: with call bell/phone within reach;in chair;with chair alarm set;with family/visitor present Nurse Communication: Mobility status;Patient requests pain meds PT Visit Diagnosis: Muscle weakness (generalized) (M62.81);Pain Pain - Right/Left: Left Pain - part of body: Leg     Time: 1660-6301 PT Time Calculation (min) (ACUTE ONLY): 33 min  Charges:  $Gait Training: 8-22 mins                     Raymond Gurney, PT, DPT Acute Rehabilitation Services  Pager: [phone] Office: [phone]    Jewel Baize 01/28/2021, 1:54 PM

## 2021-01-28 NOTE — Discharge Summary (Signed)
Physician Discharge Summary  Louis [name] TAV:[id number] DOB: 01-11-1966 DOA: 01/05/2021  PCP: Ray Church, NP  Admit date: 01/05/2021 Discharge date: 01/28/2021  Time spent: 73minutes  Recommendations for Outpatient Follow-up:  Complete antibiotics as per infectious disease-monotherapy with vancomycin IV per pharmacy ending 03/10/2021 and then follow-up with infectious disease clinic Chem-12 CBC magnesium in about 1 week Continue core track feeds until calorie count obtained showing >50% of meals taken by mouth and then can consider discontinuation of core track Suggested resumption oral hypoglycemic agents example Glucotrol metformin etc. etc. at rehab and can likely discontinue Lantus  Discharge Diagnoses:  MAIN problem for hospitalization   Persistent discitis status post treatment  Please see below for itemized issues addressed in HOpsital- refer to other progress notes for clarity if needed  Discharge Condition:  Improved  Diet recommendation: Diabetic heart healthy  Filed Weights   01/09/21 1700 01/13/21 1436 01/24/21 1317  Weight: 92.9 kg 94.3 kg 97.4 kg    History of present illness:  55 y.o. male  DM TY 2-A1c recently 9 HTN CABG 2017 chronic pain Recent osteomyelitis 6/19-12/26/2020 discitis L3-S1 status post laminectomy 6/28 left psoas abscess I&D on 6/29 --then evacuation abscess 12/21/2020--ID recommended daptomycin through 8/10 for MRSA growing on blood culture and TTE was negative Readmit 7/14 with 5 days of worsening back pain severe constant bilateral radiation MRI of the lumbar spine showed multiple fluid collections and L4-L5, neurosurgery felt no need for surgical intervention as he was not neurological compromise. Daptomycin MIC was not susceptible on lab from 01/06/2019 to  clarify inability clearing the infection. Negative agitation TEE this admission IR drained 5 mL L3 level subdural fluid collection. White blood cell count is improving on vancomycin and  cefazolin started on 01/13/2021--Persistent bacteremia with Blood cultures on 01/05/2021, 01/08/2019, 01/09/2021, 01/11/2021, 01/12/2021 positive for MRSA.   Blood cultures repeated on 01/14/2021, 01/16/2021 and 01/18/2021 have remained negative till date this been on IV vancomycin and Teflaro His MRI has been repeated recently which shows a decrease in the size of abscess and he is starting to consume enough oral calories and for consideration of removal of core track-[facility] interested in rehabbing him and he will discharge there over the next several days   Pertinent events: 12/21/2020 Status postlaminectomy 12/22/2020 IR consulted and placed a drain in the iliopsoas abscess drain 7/13, 7/14 and 7/17 blood cultures positive for MRSA 01/06/2021 neurosurgery was consulted and since the patient was neurologically intact they do not feel he needs emergent decompression. 01/11/2021 TEE negative for vegetation. 01/12/2021 CT scan of the abdomen pelvis showed progressive anasarca progressive erosion of L3-L4 with increasing paraspinal inflammatory collections adjacent to L3-L5 without drainable fluid at this time. 01/14/2021 blood cultures negative till date 01/28/2021--decreasing size of phlegmon abscess and back on MRI that was performed    Hospital Course:  Sepsis and secondary to discitis and epidural abscess/discitis:  IV vancomycin and certraline until 10/29/3746 and switch back to IV vancomycin alone for 6 weeks. MRI w/without contrast ordered 01/28/2021 showed improved abscess size and patient stabilizing   Persistent MRSA bacteremia: ID recommended to continue IV vancomycin and ceftaroline Ceftolozane/vancomycin-->01/28/2021 and then continue IV vancomycin alone to complete 6 weeks ending 03/10/2021 Outpatient infectious disease follow-up will need to be arranged post inpatient rehab stay   Acute metabolic encephalopathy??  Cognitive impairment Became encephalopathic, likely multifactorial in the setting of  opioids, metabolic and infectious etiology--this is improved significantly MRI of the brain showed no acute findings. Currently on Haldol 2 mg every  6 as needed agitation Effexor 75 mg AM We may be able to liberate him from the Haldol completely some of this might have been behavioral  Severe protein caloric malnutrition/anasarca: Prealbumin of 5.9. Patient removed core track, several times this admission core track was initially placed on 8/1 Continue enteral feeds until eating about 50% of meals--appreciate RD input with calorie count-patient seems to be improving--please liberalize his diet some at rehab   Uncontrolled diabetes mellitus type 2: Continue lantus 5, SSI CBG 150s to 260s May benefit from the addition of Farxiga metformin and would hold Glucotrol until he is discharged finally home   Abnormal EKG: Had ST segment elevation V1 V2 without reciprocal changes cardiology was consulted and a 2D echo showed no wall motion abnormalities cardiac biomarkers remain remained flat.  Chronic diastolic heart failure: Appears anasarca on physical exam likely due to low albumin   Probable hypervolemic hyponatremia He is third spacing, his albumin is slowly started to trend down Start back Lasix 20  day iv for now--would keep the same dose may be able to increase it in the next several days to twice daily   Mechanical fall: Patient had a fall on 12/31/2020. CBD CT scan of the head showed no acute findings.  Continue fall precautions.   Cholestasis: Reactive due to infectious trends complete metabolic panel to be done periodically GERD: Continue Protonix. Oral thrush:Completed 7-day course of Diflucan. Thrombocytosis:  Discharge Exam: Vitals:   01/28/21 1159 01/28/21 1555  BP: (!) 134/92 121/76  Pulse: 97 98  Resp: 17 18  Temp: 97.9 F (36.6 C) 98.7 F (37.1 C)  SpO2: 100% 98%    Subj on day of d/c   Awake pleasant Eager to work hard with rehab  General Exam on  discharge  EOMI NCAT no focal deficit Chest clear no added sound no rales no rhonchi abdomen soft nontender no rebound no guarding Power 5/5 sensory grossly intact Grade 2 lower extremity edema No rash  Discharge Instructions   Discharge Instructions     [facility] pharmacist to adjust dose for Vancomycin, Aminoglycosides and other anti-infective therapies as requested by physician.   Complete by: As directed    [facility] to provide Cath Flo 2mg    Complete by: As directed    Administer for PICC line occlusion and as ordered by physician for other access device issues.   Anaphylaxis Kit: Provided to treat any anaphylactic reaction to the medication being provided to the patient if First Dose or when requested by physician   Complete by: As directed    Epinephrine 1mg /ml vial / amp: Administer 0.3mg  (0.49ml) subcutaneously once for moderate to severe anaphylaxis, nurse to call physician and pharmacy when reaction occurs and call 911 if needed for immediate care   Diphenhydramine 50mg /ml IV vial: Administer 25-50mg  IV/IM PRN for first dose reaction, rash, itching, mild reaction, nurse to call physician and pharmacy when reaction occurs   Sodium Chloride 0.9% NS 543ml IV: Administer if needed for hypovolemic blood pressure drop or as ordered by physician after call to physician with anaphylactic reaction   Change dressing on IV access line weekly and PRN   Complete by: As directed    Diet - low sodium heart healthy   Complete by: As directed    Flush IV access with Sodium Chloride 0.9% and Heparin 10 units/ml or 100 units/ml   Complete by: As directed    Home infusion instructions - [facility]   Complete by:  As directed    Instructions: Flush IV access with Sodium Chloride 0.9% and Heparin 10units/ml or 100units/ml   Change dressing on IV access line: Weekly and PRN   Instructions Cath Flo $Remove'2mg'[id number]$ : Administer for PICC Line occlusion and as ordered by  physician for other access device   [facility] pharmacist to adjust dose for: Vancomycin, Aminoglycosides and other anti-infective therapies as requested by physician   Increase activity slowly   Complete by: As directed    Method of administration may be changed at the discretion of home infusion pharmacist based upon assessment of the patient and/or caregiver's ability to self-administer the medication ordered   Complete by: As directed    No wound care   Complete by: As directed       Allergies as of 01/28/2021   No Known Allergies      Medication List     STOP taking these medications    daptomycin  IVPB Commonly known as: CUBICIN   furosemide 20 MG tablet Commonly known as: LASIX Replaced by: furosemide 10 MG/ML injection   gabapentin 100 MG capsule Commonly known as: NEURONTIN   icosapent Ethyl 1 g capsule Commonly known as: VASCEPA   lactulose 10 GM/15ML solution Commonly known as: CHRONULAC   methocarbamol 500 MG tablet Commonly known as: ROBAXIN   metoprolol tartrate 25 MG tablet Commonly known as: LOPRESSOR   polyethylene glycol 17 g packet Commonly known as: MIRALAX / GLYCOLAX       TAKE these medications    acetaminophen 325 MG tablet Commonly known as: TYLENOL Take 2 tablets (650 mg total) by mouth every 6 (six) hours as needed for mild pain (or Fever >/= 101).   ALPRAZolam 0.25 MG tablet Commonly known as: XANAX Take 0.25 mg by mouth 2 (two) times daily as needed for anxiety.   aspirin EC 81 MG tablet Take 81 mg by mouth daily. Swallow whole.   atorvastatin 40 MG tablet Commonly known as: LIPITOR Take 1 tablet (40 mg total) by mouth at bedtime.   dapagliflozin propanediol 10 MG Tabs tablet Commonly known as: FARXIGA Take 10 mg by mouth every morning.   diclofenac Sodium 1 % Gel Commonly known as: VOLTAREN Apply 1 application topically 4 (four) times daily as needed (pain).   feeding supplement (GLUCERNA 1.5 CAL)  Liqd Place 1,000 mLs into feeding tube daily.   feeding supplement (PROSource TF) liquid Place 45 mLs into feeding tube 2 (two) times daily.   (feeding supplement) PROSource Plus liquid Take 30 mLs by mouth 3 (three) times daily between meals.   free water Soln Place 200 mLs into feeding tube every 4 (four) hours.   furosemide 10 MG/ML injection Commonly known as: LASIX Inject 2 mLs (20 mg total) into the vein daily. Start taking on: January 29, 2021 Replaces: furosemide 20 MG tablet   glipiZIDE 5 MG tablet Commonly known as: GLUCOTROL Take 5 mg by mouth 2 (two) times daily.   lidocaine 5 % Commonly known as: LIDODERM Place 1 patch onto the skin daily as needed (pain). Remove & Discard patch within 12 hours or as directed by MD   losartan 25 MG tablet Commonly known as: COZAAR Take 25 mg by mouth every morning.   metFORMIN 500 MG tablet Commonly known as: GLUCOPHAGE Take 500-1,000 mg by mouth See admin instructions. Take 2 tablets (1000 mg) by mouth every morning and 1 tablet (500 mg) at night   metoprolol succinate 25 MG 24 hr tablet Commonly known as:  TOPROL-XL Take 1 tablet (25 mg total) by mouth daily. Start taking on: January 29, 2021   omeprazole 20 MG capsule Commonly known as: PRILOSEC Take 20 mg by mouth every morning.   oxyCODONE 20 mg 12 hr tablet Commonly known as: OXYCONTIN Take 1 tablet (20 mg total) by mouth every 12 (twelve) hours.   oxyCODONE 15 MG immediate release tablet Commonly known as: ROXICODONE Take 1 tablet (15 mg total) by mouth every 3 (three) hours as needed for severe pain ((score 7 to 10)).   vancomycin  IVPB Inject 1,500 mg into the vein every 12 (twelve) hours. Indication:  bacteremia First Dose: Yes Last Day of Therapy:  03/10/21 Labs - $Remo"Sunday/Monday:  CBC/D, BMP, and vancomycin trough. Labs - Thursday:  BMP and vancomycin trough Labs - Every other week:  ESR and CRP Method of administration:Elastomeric Method of administration  may be changed at the discretion of the patient and/or caregiver's ability to self-administer the medication ordered.   venlafaxine XR 75 MG 24 hr capsule Commonly known as: EFFEXOR-XR Take 75 mg by mouth every morning.               Discharge Care Instructions  (From admission, onward)           Start     Ordered   01/28/21 0000  Change dressing on IV access line weekly and PRN  (Home infusion instructions - [facility] )        08"DAQEB$ /05/22 1741           No Known Allergies    The results of significant diagnostics from this hospitalization (including imaging, microbiology, ancillary and laboratory) are listed below for reference.    Significant Diagnostic Studies: CT HEAD WO CONTRAST  Result Date: 01/07/2021 CLINICAL DATA:  Altered mental status EXAM: CT HEAD WITHOUT CONTRAST TECHNIQUE: Contiguous axial images were obtained from the base of the skull through the vertex without intravenous contrast. COMPARISON:  12/16/2020 FINDINGS: Brain: No acute intracranial abnormality. Specifically, no hemorrhage, hydrocephalus, mass lesion, acute infarction, or significant intracranial injury. Vascular: No hyperdense vessel or unexpected calcification. Skull: No acute calvarial abnormality. Sinuses/Orbits: No acute findings Other: None IMPRESSION: No acute intracranial abnormality. Electronically Signed   By: Rolm Baptise M.D.   On: 01/07/2021 10:03   CT Angio Chest PE W and/or Wo Contrast  Result Date: 12/29/2020 CLINICAL DATA:  PE suspected, high prob Recent back surgery, discharged 3 days ago. Electronic records lists lumbar laminectomy for epidural abscess on 12/21/2020. EXAM: CT ANGIOGRAPHY CHEST WITH CONTRAST TECHNIQUE: Multidetector CT imaging of the chest was performed using the standard protocol during bolus administration of intravenous contrast. Multiplanar CT image reconstructions and MIPs were obtained to evaluate the vascular anatomy. CONTRAST:  60mL OMNIPAQUE  IOHEXOL 350 MG/ML SOLN COMPARISON:  Radiograph earlier today. Chest CT 01/05/2014. Thoracic MRI 12/21/2020 FINDINGS: Cardiovascular: There are no filling defects within the pulmonary arteries to suggest pulmonary embolus. The subsegmental branches are not well assessed due to contrast bolus timing, particularly in the lower lobes where there is also breathing motion artifact. Right upper extremity PICC tip in the SVC. Mild aortic atherosclerosis. Post median sternotomy. Heart is normal in size. No pericardial effusion. Mediastinum/Nodes: Prominent mediastinal lymph nodes measuring 10-11 mm in the anterior paratracheal and prevascular station. No hilar adenopathy. Small bilateral axillary nodes. No esophageal wall thickening. No thyroid nodule. Lungs/Pleura: Small to moderate left and small right pleural effusion. There is adjacent compressive atelectasis. 4 mm right middle lobe pulmonary nodule,  series 6, image 80. This is slightly larger than on prior exam. 4 mm subpleural nodule in the left lower lobe, series 6, image 72, new from prior. There additional small perifissural and subpleural nodules. Mild heterogeneous pulmonary parenchyma. No pneumothorax. Upper Abdomen: No acute findings. Musculoskeletal: Sclerosis at T7-T8 vertebral bodies with interval T7 laminectomy. Small amount of gas in the soft tissues and operative bed, as well as the spinal canal at this level. There is anterior paravertebral soft tissue thickening/phlegmon. Left T12 laminectomy with small amount of gas in the operative bed. Associated paravertebral soft tissue thickening is only partially included in the field of view upper abdominal flank subcutaneous edema. Review of the MIP images confirms the above findings. IMPRESSION: 1. No pulmonary embolus. 2. Small to moderate left and small right pleural effusions with adjacent compressive atelectasis. 3. Recent T7-T8 discitis osteomyelitis with epidural abscess and surgical debridement. Interval  T7 laminectomy. Small amount of gas in the soft tissues and operative bed, as well as the spinal canal at this level. There is anterior paravertebral soft tissue thickening/phlegmon, also seen on prior MRI. If there is clinical concern for persistent or recurrent spinal infection, MRI is recommended. 4. Mild heterogeneous pulmonary parenchyma, likely small airways disease. 5. Small pulmonary nodules, largest measuring 4 mm in the right middle lobe, slightly larger than on prior 2015 exam. In a high-risk patient, recommend follow-up in 1 year. No further follow-up is needed in a low risk patient. 6. Prominent mediastinal lymph nodes are likely reactive. Aortic Atherosclerosis (ICD10-I70.0). Electronically Signed   By: Keith Rake M.D.   On: 12/29/2020 22:49   CT PELVIS W CONTRAST  Result Date: 01/07/2021 CLINICAL DATA:  55 year old male with recurrent MRSA bacteremia. Status post fall today. Left paraspinal abscess at L3. History of left psoas abscess at the L4 and L5 levels drained with CT guidance last month. EXAM: CT PELVIS WITH CONTRAST TECHNIQUE: Multidetector CT imaging of the pelvis was performed using the standard protocol following the bolus administration of intravenous contrast. CONTRAST:  60mL OMNIPAQUE IOHEXOL 300 MG/ML  SOLN COMPARISON:  CT Abdomen and Pelvis 12/11/2020. Lumbar MRI 12/20/2020, 01/05/2021. FINDINGS: Urinary Tract: Mildly distended but otherwise unremarkable bladder. Distal ureters appear decompressed. Kidneys not included. Bowel: No dilated large or small bowel. Mild retained stool in the distal colon. Normal appendix on series 7, image 74. Vascular/Lymphatic: Suboptimal intravascular contrast such that Vascular patency is not well evaluated. Aortoiliac calcified atherosclerosis. Proximal femoral calcified plaque. No lymphadenopathy. Reproductive:  Negative. Other: Anasarca. Trace layering fluid in the deep pelvis with simple fluid density (series 2, image 58). Musculoskeletal:  Minimal inclusion of the L4-L5 level on this exam. Ventral epidural and paraspinal inflammation more pronounced at L4 was better demonstrated on the recent MRI. Subtle evidence of the medial left psoas muscle abscess which was drained last month on series 2, image 9. No new or increased psoas muscle inflammation there. No drainable pelvic abscess identified. SI joints appear symmetric and intact. No acute osseous abnormality identified in the pelvis. Proximal femurs are intact. IMPRESSION: 1. No pelvic abscess or acute osseous abnormality identified. Trace free fluid in the deep pelvis, likely related to anasarca. Subtle residual of the left psoas abscess at the L5 level which was drained last month. 2. Infected lumbar levels minimally included on this exam. See Lumbar MRI 01/05/2021. 3. Aortic Atherosclerosis (ICD10-I70.0). Electronically Signed   By: Genevie Ann M.D.   On: 01/07/2021 12:04   MR BRAIN W WO CONTRAST  Result Date: 01/06/2021  CLINICAL DATA:  Mental status change, unknown cause. 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 EXAM: MRI HEAD WITHOUT AND WITH CONTRAST TECHNIQUE: Multiplanar, multiecho pulse sequences of the brain and surrounding structures were obtained without and with intravenous contrast. CONTRAST:  9.54mL GADAVIST GADOBUTROL 1 MMOL/ML IV SOLN COMPARISON:  Head CT 12/16/2020. FINDINGS: Brain: Multiple sequences are significantly motion degraded, limiting evaluation. Most notably, there is moderate/severe motion degradation of the axial T2/FLAIR sequence, severe motion degradation of the coronal T2 weighted sequence, moderate motion degradation of the axial T1 weighted postcontrast sequence and severe motion degradation of the coronal T1 weighted postcontrast sequence. Mild generalized cerebral and cerebellar atrophy. Mild multifocal T2/FLAIR hyperintensity within the cerebral white matter, nonspecific but compatible chronic small vessel ischemic disease. There is no evidence  of acute infarct. Within described limitations, no evidence of an intracranial mass, chronic intracranial blood products or an extra-axial fluid collection. No midline shift. Within limitations of motion degradation, no pathologic intracranial enhancement is identified. Vascular: Expected proximal arterial flow voids. Skull and upper cervical spine: No focal marrow lesion. Sinuses/Orbits: Visualized orbits show no acute finding. Trace mucosal thickening within the bilateral ethmoid, sphenoid and left maxillary sinuses. IMPRESSION: Significantly motion degraded examination, as described and limiting evaluation. Within this limitation, no definite acute intracranial abnormality is identified. Mild generalized parenchymal atrophy and cerebral white matter chronic small vessel ischemic disease. Mild paranasal sinus disease, as described. Electronically Signed   By: Kellie Simmering DO   On: 01/06/2021 19:22   MR THORACIC SPINE WO CONTRAST  Result Date: 01/27/2021 CLINICAL DATA:  Epidural abscess EXAM: MRI THORACIC AND LUMBAR SPINE WITHOUT CONTRAST TECHNIQUE: Multiplanar and multiecho pulse sequences of the thoracic and lumbar spine were obtained without intravenous contrast. COMPARISON:  01/05/2021 FINDINGS: MRI THORACIC SPINE FINDINGS Alignment:  Physiologic. Vertebrae: Hyperintense T2-weighted signal within the T7 and T8 vertebral bodies. There is posterior decompression at the same level. Cord: There is mild hyperintense T2-weighted signal within the central spinal cord at the T6-7 and T8-T10 levels, possibly a prominent central canal. Thin dorsal epidural collection extends from T5-6 through T8-9 Paraspinal and other soft tissues: Large pleural effusions Disc levels: Small disc bulge with endplate spurring at Y3-0 causing mild spinal canal stenosis. Moderate bilateral foraminal stenosis MRI LUMBAR SPINE FINDINGS Segmentation:  Standard. Alignment:  Physiologic. Vertebrae:  Slight progression of signal changes at  L3-5. Conus medullaris and cauda equina: Conus extends to the L1 level. Ventral epidural collection at L2-3 to L5-S1, measuring 5 mm in thickness. Paraspinal and other soft tissues: There are multiple bilateral psoas collections, the largest of which measures 3.4 cm on the left. Dorsal paraspinal collection less clearly characterized without IV contrast. Disc levels: L1-L2: Normal disc space and facet joints. No spinal canal stenosis. No neural foraminal stenosis. L2-L3: Normal disc space and facet joints. No spinal canal stenosis. No neural foraminal stenosis. L3-L4: Persistent disc space edema. Status post left laminectomy. Central spinal canal narrowing due to ventral epidural collection has progressed from the prior study. The neural foramina are patent. L4-L5: Persistent disc space edema. Ventral epidural collection with mild narrowing of central spinal canal. Moderate right and severe left neural stenosis unchanged L5-S1: Small disc bulge with ventral epidural collection. No spinal canal stenosis. Severe right and moderate left neural foraminal stenosis. Visualized sacrum: Normal. IMPRESSION: 1. Signal abnormality within the vertebral bodies and disc spaces at L3-4 and L4-5, consistent with discitis-osteomyelitis. Persistent ventral epidural collection at L2-S1 that measures 5 mm in thickness. 2. T7-8 discitis-osteomyelitis with  dorsal epidural collection extending from T5-T9, likely a thin residual amount of abscess. 3. Multiple bilateral psoas abscesses. 4. Bilateral pleural effusions. 5. Moderate-to-severe bilateral neural foraminal stenosis at L4-5 and L5-S1. Electronically Signed   By: Ulyses Jarred M.D.   On: 01/27/2021 21:35   MR LUMBAR SPINE WO CONTRAST  Result Date: 01/27/2021 CLINICAL DATA:  Epidural abscess EXAM: MRI THORACIC AND LUMBAR SPINE WITHOUT CONTRAST TECHNIQUE: Multiplanar and multiecho pulse sequences of the thoracic and lumbar spine were obtained without intravenous contrast.  COMPARISON:  01/05/2021 FINDINGS: MRI THORACIC SPINE FINDINGS Alignment:  Physiologic. Vertebrae: Hyperintense T2-weighted signal within the T7 and T8 vertebral bodies. There is posterior decompression at the same level. Cord: There is mild hyperintense T2-weighted signal within the central spinal cord at the T6-7 and T8-T10 levels, possibly a prominent central canal. Thin dorsal epidural collection extends from T5-6 through T8-9 Paraspinal and other soft tissues: Large pleural effusions Disc levels: Small disc bulge with endplate spurring at H6-3 causing mild spinal canal stenosis. Moderate bilateral foraminal stenosis MRI LUMBAR SPINE FINDINGS Segmentation:  Standard. Alignment:  Physiologic. Vertebrae:  Slight progression of signal changes at L3-5. Conus medullaris and cauda equina: Conus extends to the L1 level. Ventral epidural collection at L2-3 to L5-S1, measuring 5 mm in thickness. Paraspinal and other soft tissues: There are multiple bilateral psoas collections, the largest of which measures 3.4 cm on the left. Dorsal paraspinal collection less clearly characterized without IV contrast. Disc levels: L1-L2: Normal disc space and facet joints. No spinal canal stenosis. No neural foraminal stenosis. L2-L3: Normal disc space and facet joints. No spinal canal stenosis. No neural foraminal stenosis. L3-L4: Persistent disc space edema. Status post left laminectomy. Central spinal canal narrowing due to ventral epidural collection has progressed from the prior study. The neural foramina are patent. L4-L5: Persistent disc space edema. Ventral epidural collection with mild narrowing of central spinal canal. Moderate right and severe left neural stenosis unchanged L5-S1: Small disc bulge with ventral epidural collection. No spinal canal stenosis. Severe right and moderate left neural foraminal stenosis. Visualized sacrum: Normal. IMPRESSION: 1. Signal abnormality within the vertebral bodies and disc spaces at L3-4 and  L4-5, consistent with discitis-osteomyelitis. Persistent ventral epidural collection at L2-S1 that measures 5 mm in thickness. 2. 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 3. Multiple bilateral psoas abscesses. 4. Bilateral pleural effusions. 5. Moderate-to-severe bilateral neural foraminal stenosis at L4-5 and L5-S1. Electronically Signed   By: Ulyses Jarred M.D.   On: 01/27/2021 21:35   MR THORACIC SPINE W WO CONTRAST  Result Date: 01/05/2021 CLINICAL DATA:  Spine surgery 2 weeks ago. Increasing pain for the last 2 days. Redness and swelling to the operative site. EXAM: MRI THORACIC AND LUMBAR SPINE WITHOUT AND WITH CONTRAST TECHNIQUE: Multiplanar and multiecho pulse sequences of the thoracic and lumbar spine were obtained without and with intravenous contrast. CONTRAST:  48mL GADAVIST GADOBUTROL 1 MMOL/ML IV SOLN COMPARISON:  None. FINDINGS: MRI THORACIC SPINE FINDINGS Alignment:  No significant listhesis is present. Vertebrae: Diffuse T2 hyperintensity enhancement throughout the T7 and T8 vertebral bodies has increased. Patient is status post T7 laminectomy. Focal enhancement is again noted at the T4 vertebral body. Cord: Cord is displaced posteriorly by a epidural fluid T6-7. Cord size and signal are otherwise normal. Paraspinal and other soft tissues: Posterior epidural fluid collection is partially decompressed. It extends superiorly to the T5-6 level similar the prior exam. There is less mass effect posteriorly  than on the previous study. Collection extends inferiorly to the T9 level. There is an extensive ventral collection extending into the lumbar spine. Abnormal paraspinous soft tissue enhancement extends from T3-4 to T9-10. No drainable collection is present. There is a peripherally enhancing fluid collection the operative bed which measures 6.1 x 1.0 x 2.1 cm. Disc levels: No focal disc disease is present. Abnormal  enhancement is present in the foramina on the left T4-5, T5-6, T6-7 T7-8, and T8-9. Right foraminal enhancement is less extensive predominantly involving T6-7, T7-8 T8-9. MRI LUMBAR SPINE FINDINGS Segmentation: 5 non rib-bearing lumbar type vertebral bodies are present. The lowest fully formed vertebral body is L5. Alignment:  No significant listhesis is present. Vertebrae: Progressive signal abnormality and enhancement is present in the vertebral bodies, predominantly at L3 and L4. There is abnormal disc signal without definite disc enhancement at L3-4 and L4-5. There is heterogeneous signal and enhancement posteriorly at L5-S1 which may be related to degenerative change. Conus medullaris: Extends to the L1-2 level and appears normal. Paraspinal and other soft tissues: Patient is status post left laminectomy at L3. Fluid tracks from the laminectomy site into a subcutaneous and deep soft tissue collection with peripheral enhancement measuring 4.7 x 5.0 x 1.7 cm. The ventral epidural collection extends inferiorly to the L2 level. Separate prominent ventral collection is present posterior to L4 prominently right the left. This collection measures 2.9 x 1.3 cm on the sagittal images. Disc levels: In addition to the epidural collections, disc disease is again noted at L4-5 and L5-S1. Central foraminal narrowing are associated. IMPRESSION: 1. T7 laminectomy subcutaneous peripherally enhancing fluid collection at this level as well. 2. Residual posterior epidural fluid collection in the thoracic spine with similar cephalo caudad extension from T5-6-T9 but less mass effect. 3. Progressive signal abnormality and enhancement throughout the T7 and T8 vertebral bodies in the T7-8 thoracic disc consistent with disc osteomyelitis. 4. Progressive paraspinous soft tissue enhancement at T3-4 through T9-10 without drainable collection. 5. Persistent extensive ventral epidural collection compatible with abscess. 6. Status post left  laminectomy at L3 with peripherally enhancing fluid collection extending from the operative site into the subcutaneous and deep tissues as described. This is concerning abscess. 7. Fluid tracks from the laminectomy site into a subdural deep soft tissue collection measuring 4.7 x 5.0 x 1.7 cm. 8. Separate ventral epidural collection at L4-5 and L5-S1 consistent with epidural abscess. 9. Progressive signal abnormality and enhancement in the L3 and L4 vertebral bodies. 10. 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 Electronically Signed   By: San Morelle M.D.   On: 01/05/2021 21:24   MR Lumbar Spine W Wo Contrast  Result Date: 01/05/2021 CLINICAL DATA:  Spine surgery 2 weeks ago. Increasing pain for the last 2 days. Redness and swelling to the operative site. EXAM: MRI THORACIC AND LUMBAR SPINE WITHOUT AND WITH CONTRAST TECHNIQUE: Multiplanar and multiecho pulse sequences of the thoracic and lumbar spine were obtained without and with intravenous contrast. CONTRAST:  11mL GADAVIST GADOBUTROL 1 MMOL/ML IV SOLN COMPARISON:  None. FINDINGS: MRI THORACIC SPINE FINDINGS Alignment:  No significant listhesis is present. Vertebrae: Diffuse T2 hyperintensity enhancement throughout the T7 and T8 vertebral bodies has increased. Patient is status post T7 laminectomy. Focal enhancement is again noted at the T4 vertebral body. Cord: Cord is displaced posteriorly by a epidural fluid T6-7. Cord size and signal are  otherwise normal. Paraspinal and other soft tissues: Posterior epidural fluid collection is partially decompressed. It extends superiorly to the T5-6 level similar the prior exam. There is less mass effect posteriorly than on the previous study. Collection extends inferiorly to the T9 level. There is an extensive ventral collection extending into the lumbar spine.  Abnormal paraspinous soft tissue enhancement extends from T3-4 to T9-10. No drainable collection is present. There is a peripherally enhancing fluid collection the operative bed which measures 6.1 x 1.0 x 2.1 cm. Disc levels: No focal disc disease is present. Abnormal enhancement is present in the foramina on the left T4-5, T5-6, T6-7 T7-8, and T8-9. Right foraminal enhancement is less extensive predominantly involving T6-7, T7-8 T8-9. MRI LUMBAR SPINE FINDINGS Segmentation: 5 non rib-bearing lumbar type vertebral bodies are present. The lowest fully formed vertebral body is L5. Alignment:  No significant listhesis is present. Vertebrae: Progressive signal abnormality and enhancement is present in the vertebral bodies, predominantly at L3 and L4. There is abnormal disc signal without definite disc enhancement at L3-4 and L4-5. There is heterogeneous signal and enhancement posteriorly at L5-S1 which may be related to degenerative change. Conus medullaris: Extends to the L1-2 level and appears normal. Paraspinal and other soft tissues: Patient is status post left laminectomy at L3. Fluid tracks from the laminectomy site into a subcutaneous and deep soft tissue collection with peripheral enhancement measuring 4.7 x 5.0 x 1.7 cm. The ventral epidural collection extends inferiorly to the L2 level. Separate prominent ventral collection is present posterior to L4 prominently right the left. This collection measures 2.9 x 1.3 cm on the sagittal images. Disc levels: In addition to the epidural collections, disc disease is again noted at L4-5 and L5-S1. Central foraminal narrowing are associated. IMPRESSION: 1. T7 laminectomy subcutaneous peripherally enhancing fluid collection at this level as well. 2. Residual posterior epidural fluid collection in the thoracic spine with similar cephalo caudad extension from T5-6-T9 but less mass effect. 3. Progressive signal abnormality and enhancement throughout the T7 and T8 vertebral  bodies in the T7-8 thoracic disc consistent with disc osteomyelitis. 4. Progressive paraspinous soft tissue enhancement at T3-4 through T9-10 without drainable collection. 5. Persistent extensive ventral epidural collection compatible with abscess. 6. Status post left laminectomy at L3 with peripherally enhancing fluid collection extending from the operative site into the subcutaneous and deep tissues as described. This is concerning abscess. 7. Fluid tracks from the laminectomy site into a subdural deep soft tissue collection measuring 4.7 x 5.0 x 1.7 cm. 8. Separate ventral epidural collection at L4-5 and L5-S1 consistent with epidural abscess. 9. Progressive signal abnormality and enhancement in the L3 and L4 vertebral bodies. 10. 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 Electronically Signed   By: San Morelle M.D.   On: 01/05/2021 21:24   CT ABDOMEN PELVIS W CONTRAST  Result Date: 01/12/2021 CLINICAL DATA:  Abdominal pain, fever, bacteremia EXAM: CT ABDOMEN AND PELVIS WITH CONTRAST TECHNIQUE: Multidetector CT imaging of the abdomen and pelvis was performed using the standard protocol following bolus administration of intravenous contrast. CONTRAST:  156mL OMNIPAQUE IOHEXOL 300 MG/ML  SOLN COMPARISON:  12/16/2020, lumbar spine MRI 12/12/2020 FINDINGS: Lower chest: Small right and moderate to large left pleural effusions have developed, new since prior examination. There is left lower lobe collapse. Extensive coronary artery calcification noted. Cardiac within normal limits. Hepatobiliary: No focal liver abnormality is  seen. No gallstones, gallbladder wall thickening, or biliary dilatation. Pancreas: Unremarkable Spleen: Unremarkable Adrenals/Urinary Tract: The adrenal glands are unremarkable. The kidneys are normal in size and position. Minimally  complex rim calcified exophytic cyst is again seen arising from the lower pole of the left kidney, better assessed as a Bosniak class 2 cyst on prior MRI examination 12/12/2020. The bladder is unremarkable. Stomach/Bowel: Stomach is within normal limits. Appendix appears normal. No evidence of bowel wall thickening, distention, or inflammatory changes. Mild free fluid has developed within the pelvis. Vascular/Lymphatic: Mild aortoiliac atherosclerotic calcification. No aortic aneurysm. No pathologic adenopathy within the abdomen and pelvis. Reproductive: Prostate is unremarkable. Other: The paraspinal inflammatory collections seen at L3-L5 have enlarged since prior examination. The rim enhancing inflammatory collection measures roughly 3.1 x 4.9 cm on axial image # 56/3 and is now better appreciated on the right measuring roughly 2.3 x 3.5 cm at axial image # 52/3. No discrete drainable fluid is identified. There is interval development of moderate diffuse subcutaneous edema. 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 No pathologic fracture. The spinal canal is not well assessed with this technique. No other lytic or blastic bone lesions are identified. IMPRESSION: Progressive erosive changes involving the L3 and L4 vertebral bodies in keeping with progressive changes of discitis osteomyelitis. No pathologic fracture. Increasing paraspinal inflammatory collections adjacent to L3-L5 bilaterally without discrete drainable fluid at this time. Progressive anasarca with development of pleural effusions, mild ascites, and moderate diffuse subcutaneous body wall edema. Aortic Atherosclerosis (ICD10-I70.0). Electronically Signed   By: Fidela Salisbury MD   On: 01/12/2021 20:12   DG Chest Port 1 View  Result  Date: 12/29/2020 CLINICAL DATA:  Shortness of breath EXAM: PORTABLE CHEST 1 VIEW COMPARISON:  12/12/2020 FINDINGS: Postoperative changes in the mediastinum. Right PICC line with tip over the cavoatrial junction region. Heart size and pulmonary vascularity are normal. Lungs are clear. No pleural effusions. No pneumothorax. Mediastinal contours appear intact. IMPRESSION: No active disease. Electronically Signed   By: Lucienne Capers M.D.   On: 12/29/2020 21:01   DG Abd Portable 1V  Result Date: 01/26/2021 CLINICAL DATA:  Feeding to EXAM: PORTABLE ABDOMEN - 1 VIEW COMPARISON:  01/24/2021 FINDINGS: Feeding tube tip overlies the gastric antrum/proximal duodenum. This is unchanged from prior exam. Nonobstructive bowel gas pattern. Prior median sternotomy. IMPRESSION: Feeding tube tip overlies the gastric antrum/proximal duodenum. Electronically Signed   By: Maurine Simmering   On: 01/26/2021 13:48   DG Abd Portable 1V  Result Date: 01/24/2021 CLINICAL DATA:  Post Cortrak feeding tube placement. EXAM: PORTABLE ABDOMEN - 1 VIEW COMPARISON:  Radiographs 01/23/2021.  CT 01/12/2021. FINDINGS: 1318 hours. Tip of the feeding tube projects over the right upper quadrant of the abdomen, likely in the distal stomach or proximal duodenum. The visualized bowel gas pattern is nonobstructive. Patient is status post median sternotomy and CABG. IMPRESSION: Feeding tube tip in the distal stomach or proximal duodenum. Electronically Signed   By: Richardean Sale M.D.   On: 01/24/2021 15:18   DG Abd Portable 1V  Result Date: 01/23/2021 CLINICAL DATA:  NG tube placement. EXAM: PORTABLE ABDOMEN - 1 VIEW COMPARISON:  Earlier today at 11:07 a.m. FINDINGS: 12:36 p.m.Nasogastric tube terminates at the gastric body with the side port in the proximal stomach. Non-obstructive bowel gas pattern. Suspect left base  airspace disease. No free intraperitoneal air. IMPRESSION: Nasogastric tube terminating at the body of the stomach with the side port at  the level of the proximal stomach. Electronically Signed   By: Abigail Miyamoto M.D.   On: 01/23/2021 12:51   DG Abd Portable 1V  Result Date: 01/23/2021 CLINICAL DATA:  NG tube placement EXAM: PORTABLE ABDOMEN - 1 VIEW COMPARISON:  Abdominal radiograph 01/22/2021 FINDINGS: Interval placement of a nasogastric tube with side port at the gastroesophageal junction. Bowel gas pattern is nonobstructive. Left lung base airspace disease. IMPRESSION: Interval placement of a nasogastric tube with side port at the gastroesophageal junction. Recommend advancing approximately 6-7 cm into the stomach. Electronically Signed   By: Audie Pinto M.D.   On: 01/23/2021 11:30   DG Abd Portable 1V  Result Date: 01/22/2021 CLINICAL DATA:  Nasogastric tube placement EXAM: PORTABLE ABDOMEN - 1 VIEW COMPARISON:  01/17/2021 FINDINGS: Removal of feeding tube. Nasogastric tube terminates at the body of the stomach with side port in the body of the stomach. Prior median sternotomy. Mild cardiomegaly. Suspect mild interstitial edema and left base airspace disease. Non-obstructive bowel gas pattern. No free intraperitoneal air. IMPRESSION: Nasogastric tube tip at body of stomach. Electronically Signed   By: Abigail Miyamoto M.D.   On: 01/22/2021 13:48   DG Abd Portable 1V  Result Date: 01/17/2021 CLINICAL DATA:  Feeding tube placement EXAM: PORTABLE ABDOMEN - 1 VIEW COMPARISON:  None FINDINGS: There is been interval placement a weighted feeding tube. The tip of the feeding tube is in the left upper quadrant of the abdomen. This may be looped within the stomach with tip in the gastric fundus versus post pyloric with tip at the duodenal jejunal junction. If post pyloric placement of the feeding tube is desired injection of a small volume contrast material through the feeding tube may confirm location. IMPRESSION: Interval placement of weighted feeding tube with tip in the gastric fundus versus duodenal jejunal junction. If post pyloric  placement of the feeding tube is desired, injection of a small volume water soluble contrast material through the tube may confirm location. Electronically Signed   By: Kerby Moors M.D.   On: 01/17/2021 11:56   ECHOCARDIOGRAM COMPLETE  Result Date: 01/07/2021    ECHOCARDIOGRAM REPORT   Patient Name:   Louis [name]  Date of Exam: 01/07/2021 Medical Rec #:  [id number]  Height:       71.0 in Accession #:    [id number] Weight:       215.2 lb Date of Birth:  03-Dec-1965   BSA:          2.175 m Patient Age:    73 years   BP:           164/94 mmHg Patient Gender: M          HR:           116 bpm. Exam Location:  Inpatient Procedure: 2D Echo, Cardiac Doppler and Color Doppler Indications:    Bacteremia  History:        Patient has prior history of Echocardiogram examinations, most                 recent 12/15/2020. CAD, Prior CABG; Risk Factors:Hypertension and                 Dyslipidemia.  Sonographer:    Clayton Lefort RDCS (AE) Referring Phys: [id number] [place]  1. Left ventricular ejection fraction, by estimation, is 55 to 60%.  The left ventricle has normal function. The left ventricle has no regional wall motion abnormalities. There is moderate left ventricular hypertrophy. Left ventricular diastolic parameters are indeterminate.  2. Right ventricular systolic function is normal. The right ventricular size is normal.  3. The mitral valve is normal in structure. No evidence of mitral valve regurgitation. No evidence of mitral stenosis.  4. The aortic valve is tricuspid. Aortic valve regurgitation is not visualized. No aortic stenosis is present.  5. The inferior vena cava is dilated in size with >50% respiratory variability, suggesting right atrial pressure of 8 mmHg. FINDINGS  Left Ventricle: Left ventricular ejection fraction, by estimation, is 55 to 60%. The left ventricle has normal function. The left ventricle has no regional wall motion abnormalities. The left ventricular internal cavity size was  normal in size. There is  moderate left ventricular hypertrophy. Left ventricular diastolic parameters are indeterminate. Right Ventricle: The right ventricular size is normal. Right ventricular systolic function is normal. Left Atrium: Left atrial size was normal in size. Right Atrium: Right atrial size was normal in size. Pericardium: There is no evidence of pericardial effusion. Mitral Valve: The mitral valve is normal in structure. No evidence of mitral valve regurgitation. No evidence of mitral valve stenosis. Tricuspid Valve: The tricuspid valve is normal in structure. Tricuspid valve regurgitation is trivial. No evidence of tricuspid stenosis. Aortic Valve: The aortic valve is tricuspid. Aortic valve regurgitation is not visualized. No aortic stenosis is present. Aortic valve mean gradient measures 2.0 mmHg. Aortic valve peak gradient measures 3.1 mmHg. Pulmonic Valve: The pulmonic valve was normal in structure. Pulmonic valve regurgitation is not visualized. No evidence of pulmonic stenosis. Aorta: The aortic root is normal in size and structure. Venous: The inferior vena cava is dilated in size with greater than 50% respiratory variability, suggesting right atrial pressure of 8 mmHg. IAS/Shunts: No atrial level shunt detected by color flow Doppler.  LEFT VENTRICLE PLAX 2D LVIDd:         4.00 cm LVIDs:         3.40 cm LV PW:         1.60 cm LV IVS:        1.50 cm LVOT diam:     2.50 cm LV SV:         79 LV SV Index:   36 LVOT Area:     4.91 cm  RIGHT VENTRICLE             IVC RV Basal diam:  3.30 cm     IVC diam: 2.20 cm RV S prime:     13.40 cm/s TAPSE (M-mode): 1.3 cm LEFT ATRIUM             Index       RIGHT ATRIUM           Index LA diam:        3.50 cm 1.61 cm/m  RA Area:     14.90 cm LA Vol (A2C):   69.4 ml 31.91 ml/m RA Volume:   36.10 ml  16.60 ml/m LA Vol (A4C):   51.3 ml 23.59 ml/m LA Biplane Vol: 62.6 ml 28.78 ml/m  AORTIC VALVE AV Area (Vmax):    6.50 cm AV Area (Vmean):   5.74 cm AV  Area (VTI):     5.53 cm AV Vmax:           87.60 cm/s AV Vmean:          59.600 cm/s AV VTI:  0.142 m AV Peak Grad:      3.1 mmHg AV Mean Grad:      2.0 mmHg LVOT Vmax:         116.00 cm/s LVOT Vmean:        69.700 cm/s LVOT VTI:          0.160 m LVOT/AV VTI ratio: 1.13  AORTA Ao Root diam: 3.50 cm Ao Asc diam:  3.60 cm  SHUNTS Systemic VTI:  0.16 m Systemic Diam: 2.50 cm Kirk Ruths MD Electronically signed by Kirk Ruths MD Signature Date/Time: 01/07/2021/12:45:33 PM    Final    ECHO TEE  Result Date: 01/11/2021    TRANSESOPHOGEAL ECHO REPORT   Patient Name:   Louis [name]  Date of Exam: 01/11/2021 Medical Rec #:  [id number]  Height:       71.0 in Accession #:    [id number] Weight:       204.8 lb Date of Birth:  Feb 08, 1966   BSA:          2.130 m Patient Age:    70 years   BP:           151/95 mmHg Patient Gender: M          HR:           90 bpm. Exam Location:  Inpatient Procedure: Transesophageal Echo and Color Doppler Indications:     Bacteremia  History:         Patient has prior history of Echocardiogram examinations, most                  recent 01/09/2021. CAD; Risk Factors:Diabetes, Dyslipidemia and                  Hypertension.  Sonographer:     Bernadene Person RDCS Referring Phys:  [id number] Darreld Mclean Diagnosing Phys: Kirk Ruths MD PROCEDURE: After discussion of the risks and benefits of a TEE, an informed consent was obtained from the patient. The transesophogeal probe was passed without difficulty through the esophogus of the patient. Sedation performed by different physician. The patient was monitored while under deep sedation. Anesthestetic sedation was provided intravenously by Anesthesiology: 143.8mg  of Propofol, 40mg  of Lidocaine. The patient's vital signs; including heart rate, blood pressure, and oxygen saturation; remained stable throughout the procedure. The patient developed no complications during the procedure. IMPRESSIONS  1. No vegetations.  2. Left ventricular  ejection fraction, by estimation, is 55 to 60%. The left ventricle has normal function.  3. Right ventricular systolic function is normal. The right ventricular size is normal.  4. No left atrial/left atrial appendage thrombus was detected.  5. The mitral valve is normal in structure. Trivial mitral valve regurgitation.  6. The aortic valve is tricuspid. Aortic valve regurgitation is trivial. FINDINGS  Left Ventricle: Left ventricular ejection fraction, by estimation, is 55 to 60%. The left ventricle has normal function. The left ventricular internal cavity size was normal in size. Right Ventricle: The right ventricular size is normal.Right ventricular systolic function is normal. Left Atrium: Left atrial size was normal in size. No left atrial/left atrial appendage thrombus was detected. Right Atrium: Right atrial size was normal in size. Pericardium: There is no evidence of pericardial effusion. Mitral Valve: The mitral valve is normal in structure. Trivial mitral valve regurgitation. Tricuspid Valve: The tricuspid valve is normal in structure. Tricuspid valve regurgitation is trivial. Aortic Valve: The aortic valve is tricuspid. Aortic valve regurgitation is trivial. Pulmonic Valve: The pulmonic  valve was normal in structure. Pulmonic valve regurgitation is trivial. Aorta: The aortic root is normal in size and structure. There is minimal (Grade I) plaque involving the descending aorta. IAS/Shunts: No atrial level shunt detected by color flow Doppler. Additional Comments: No vegetations. Kirk Ruths MD Electronically signed by Kirk Ruths MD Signature Date/Time: 01/11/2021/1:36:08 PM    Final    [place] FNA SOFT TISSUE  Result Date: 01/17/2021 CLINICAL DATA:  MRSA bacteremia, vertebral infection, psoas abscess, previous thoracolumbar instrumented surgery with dorsal paraspinal soft tissue collection. EXAM: ULTRASOUND GUIDED ASPIRATION BIOPSY OF PARASPINAL FLUID MEDICATIONS: Lidocaine 1% subcutaneous PROCEDURE:  The procedure, risks, benefits, and alternatives were explained to the patient. Questions regarding the procedure were encouraged and answered. The patient understands and consents to the procedure. Survey ultrasound of the mid lumbar dorsal subcutaneous fluid collection was performed an appropriate entry site was determined and marked. The operative field was prepped with chlorhexidine in a sterile fashion, and a sterile drape was applied covering the operative field. A sterile gown and sterile gloves were used for the procedure. Local anesthesia was provided with 1% Lidocaine. Under real-time ultrasound guidance, 18 gauge spinal needle advanced in the collection. 5 mL of turbid serosanguineous fluid were aspirated, sent for the requested laboratory studies. The patient tolerated the procedure well. COMPLICATIONS: None. FINDINGS: Subcutaneous posterior lumbar fluid collection localized. 5 mL fluid aspirated, sent for the requested laboratory studies. IMPRESSION: 1. Technically successful ultrasound-guided aspiration of lumbar subcutaneous fluid collection as above. Electronically Signed   By: Lucrezia Europe M.D.   On: 01/17/2021 15:27   ECHOCARDIOGRAM LIMITED  Result Date: 01/09/2021    ECHOCARDIOGRAM LIMITED REPORT   Patient Name:   Louis [name]  Date of Exam: 01/09/2021 Medical Rec #:  [id number]  Height:       71.0 in Accession #:    [id number] Weight:       215.2 lb Date of Birth:  1966/02/13   BSA:          2.175 m Patient Age:    41 years   BP:           153/88 mmHg Patient Gender: M          HR:           102 bpm. Exam Location:  Inpatient Procedure: Limited Color Doppler, Cardiac Doppler and Limited Echo STAT ECHO Indications:    abnormal ecg  History:        Patient has prior history of Echocardiogram examinations, most                 recent 01/07/2021. CHF, CAD, Signs/Symptoms:Bacteremia; Risk                 Factors:Hypertension.  Sonographer:    Johny Chess Referring Phys: [id number] [place]  1. Left ventricular ejection fraction, by estimation, is 55 to 60%. The left ventricle has normal function. The left ventricle has no regional wall motion abnormalities. There is mild concentric left ventricular hypertrophy.  2. Right ventricular systolic function is normal. The right ventricular size is normal.  3. The mitral valve is grossly normal. Trivial mitral valve regurgitation. No evidence of mitral stenosis.  4. The aortic valve is grossly normal. Aortic valve regurgitation is not visualized. No aortic stenosis is present.  5. The inferior vena cava is normal in size with greater than 50% respiratory variability, suggesting right atrial pressure of 3 mmHg. Comparison(s): No significant change from prior study. EF unchanged.  No WMA. FINDINGS  Left Ventricle: Left ventricular ejection fraction, by estimation, is 55 to 60%. The left ventricle has normal function. The left ventricle has no regional wall motion abnormalities. The left ventricular internal cavity size was normal in size. There is  mild concentric left ventricular hypertrophy. Abnormal (paradoxical) septal motion consistent with post-operative status. Right Ventricle: The right ventricular size is normal. No increase in right ventricular wall thickness. Right ventricular systolic function is normal. Mitral Valve: The mitral valve is grossly normal. Trivial mitral valve regurgitation. No evidence of mitral valve stenosis. Tricuspid Valve: The tricuspid valve is grossly normal. Tricuspid valve regurgitation is not demonstrated. No evidence of tricuspid stenosis. Aortic Valve: The aortic valve is grossly normal. Aortic valve regurgitation is not visualized. No aortic stenosis is present. Pulmonic Valve: The pulmonic valve was not well visualized. Aorta: The aortic root is normal in size and structure. Venous: The inferior vena cava is normal in size with greater than 50% respiratory variability, suggesting right atrial pressure of 3  mmHg. LEFT VENTRICLE PLAX 2D LVIDd:         4.80 cm     Diastology LVIDs:         3.70 cm     LV e' medial:  6.09 cm/s LV PW:         1.20 cm     LV e' lateral: 18.60 cm/s LV IVS:        1.30 cm  LV Volumes (MOD) LV vol d, MOD A4C: 82.4 ml LV vol s, MOD A4C: 46.0 ml LV SV MOD A4C:     82.4 ml AORTIC VALVE LVOT Vmax:   101.00 cm/s LVOT Vmean:  66.700 cm/s LVOT VTI:    0.156 m  SHUNTS Systemic VTI: 0.16 m Eleonore Chiquito MD Electronically signed by Eleonore Chiquito MD Signature Date/Time: 01/09/2021/12:55:44 PM    Final     Microbiology: No results found for this or any previous visit (from the past 240 hour(s)).   Labs: Basic Metabolic Panel: Recent Labs  Lab 01/22/21 0502 01/25/21 0247 01/27/21 0325  NA 132* 129* 130*  K 3.6 3.5 3.8  CL 98 94* 91*  CO2 $Re'26 28 30  'yJJ$ GLUCOSE 134* 140* 194*  BUN $Re'7 7 9  'Izk$ CREATININE 0.44* 0.52* 0.48*  CALCIUM 8.3* 8.0* 8.3*  PHOS  --   --  3.7   Liver Function Tests: Recent Labs  Lab 01/22/21 0502 01/25/21 0247 01/27/21 0325  AST 14* 10* 11*  ALT $Re'10 8 10  'qFe$ ALKPHOS 74 71 87  BILITOT 0.9 0.5 0.5  PROT 6.1* 5.5* 5.8*  ALBUMIN 2.5* 2.2* 2.2*  2.2*   No results for input(s): LIPASE, AMYLASE in the last 168 hours. No results for input(s): AMMONIA in the last 168 hours. CBC: Recent Labs  Lab 01/22/21 0502 01/25/21 0247 01/27/21 0325  WBC 11.8* 12.2* 8.0  NEUTROABS 9.0* 9.4* 5.7  HGB 8.8* 7.5* 7.6*  HCT 26.8* 23.0* 23.9*  MCV 86.7 88.5 88.8  PLT 463* 472* 526*   Cardiac Enzymes: No results for input(s): CKTOTAL, CKMB, CKMBINDEX, TROPONINI in the last 168 hours. BNP: BNP (last 3 results) Recent Labs    12/29/20 2006  BNP 303.5*    ProBNP (last 3 results) No results for input(s): PROBNP in the last 8760 hours.  CBG: Recent Labs  Lab 01/27/21 2112 01/27/21 2344 01/28/21 0401 01/28/21 0806 01/28/21 1226  GLUCAP 93 152* 156* 177* 265*       Signed:  Nita Sells MD  [facility] 01/28/2021, 5:42 PM

## 2021-01-28 NOTE — Progress Notes (Signed)
Occupational Therapy Treatment Patient Details Name: Louis [name] MRN: [id number] DOB: 07/15/1965 Today's Date: 01/28/2021    History of present illness Pt is a 55 y.o. male who presented 7/13 with worsening back pain with recurrent MRSA bacteremia after discharging 7/3 from being admitted with epidural abscess, vertebral osteomyelitis L3-S1, psoas abscess, and MRSA bacteremia with s/p 12/21/20 T7-8, T12-L1, L3-4 laminectomies for evacuation of epidural abscess. 7/13 mri lumbar/thoracic showed several areas of fluid collection with a more loculated focus at L4-5. 7/20 imaging revealed progressive erosive changes involving the L3 and L4 vertebral bodies  in keeping with progressive changes of discitis osteomyelitis. PMH: CAD, hypertension, uncontrolled diabetes mellitus.   OT comments  Pt demonstrates improved activity tolerance and ability to perform functional transfers.  He requires min A +2 for safety.  He requires mod - max A for LB ADLs.   Follow Up Recommendations  CIR    Equipment Recommendations  None recommended by OT    Recommendations for Other Services Rehab consult    Precautions / Restrictions Precautions Precautions: Back Precaution Booklet Issued: No Precaution Comments: monitor BP; provided cues to maintain during mobility Restrictions Weight Bearing Restrictions: No       Mobility Bed Mobility Overal bed mobility: Needs Assistance Bed Mobility: Rolling;Sidelying to Sit Rolling: Supervision Sidelying to sit: HOB elevated;Min assist       General bed mobility comments: Slowly elevated HOB and allowed pt to rest and adjust. Partial prop onto L elbow with legs supported by therapist for comfort to allow pt to adjust to beginning ascent to sit up EOB, minA to complete transition with pt pulling on therapist's hand.    Transfers Overall transfer level: Needs assistance Equipment used: Rolling walker (2 wheeled) Transfers: Sit to/from Stand Sit to Stand: Min assist;+2  safety/equipment         General transfer comment: MinA to power up to stand, +2 for safety. Cues for hand placement. x1 sit to stand from EOB and x1 from recliner.  Pt is very anxious with mobility requesting a second person to assist    Balance Overall balance assessment: Needs assistance Sitting-balance support: Bilateral upper extremity supported;Feet supported;Single extremity supported Sitting balance-Leahy Scale: Poor Sitting balance - Comments: Prefers UE support to reduce pain.   Standing balance support: Bilateral upper extremity supported Standing balance-Leahy Scale: Poor Standing balance comment: Reliant on UE support and external assistance for safe mobility.                           ADL either performed or assessed with clinical judgement   ADL Overall ADL's : Needs assistance/impaired                     Lower Body Dressing: Total assistance;Bed level Lower Body Dressing Details (indicate cue type and reason): pt unable to perform figure 4 Toilet Transfer: Minimal assistance;+2 for safety/equipment;Stand-pivot;BSC;RW   Toileting- Clothing Manipulation and Hygiene: Maximal assistance;Sit to/from stand       Functional mobility during ADLs: Minimal assistance;+2 for safety/equipment;Rolling walker       Vision       Perception     Praxis      Cognition Arousal/Alertness: Awake/alert Behavior During Therapy: WFL for tasks assessed/performed Overall Cognitive Status: Impaired/Different from baseline Area of Impairment: Memory;Following commands;Safety/judgement;Awareness;Attention;Orientation                 Orientation Level: Disoriented to;Time (difficulty recalling current month) Current Attention Level: Sustained  Memory: Decreased recall of precautions;Decreased short-term memory Following Commands: Follows one step commands consistently;Follows one step commands with increased time Safety/Judgement: Decreased awareness  of safety;Decreased awareness of deficits Awareness: Emergent   General Comments: Pt easily distracted requiring cues to redirect to task he is engaged in.  verbal cues for sequencing and safety        Exercises Exercises: General Lower Extremity General Exercises - Lower Extremity Long Arc Quad: AROM;Both;10 reps;Seated Hip Flexion/Marching: AROM;Both;10 reps;Seated Toe Raises: AROM;Both;10 reps;Seated Heel Raises: AROM;Both;10 reps;Seated   Shoulder Instructions       General Comments VSS on RA    Pertinent Vitals/ Pain       Pain Assessment: Faces Faces Pain Scale: Hurts even more Pain Location: bil LE's with movement Pain Descriptors / Indicators: Grimacing;Guarding;Cramping Pain Intervention(s): Monitored during session;Premedicated before session;Repositioned  Home Living                                          Prior Functioning/Environment              Frequency  Min 2X/week        Progress Toward Goals  OT Goals(current goals can now be found in the care plan section)  Progress towards OT goals: Progressing toward goals  Acute Rehab OT Goals Patient Stated Goal: to have less pain OT Goal Formulation: With patient Time For Goal Achievement: 02/11/21 Potential to Achieve Goals: Good  Plan Discharge plan remains appropriate    Co-evaluation    PT/OT/SLP Co-Evaluation/Treatment: Yes Reason for Co-Treatment: For patient/therapist safety;To address functional/ADL transfers PT goals addressed during session: Mobility/safety with mobility;Balance OT goals addressed during session: ADL's and self-care      AM-PAC OT "6 Clicks" Daily Activity     Outcome Measure   Help from another person eating meals?: A Little Help from another person taking care of personal grooming?: A Little Help from another person toileting, which includes using toliet, bedpan, or urinal?: A Lot Help from another person bathing (including washing, rinsing,  drying)?: A Lot Help from another person to put on and taking off regular upper body clothing?: A Lot Help from another person to put on and taking off regular lower body clothing?: Total 6 Click Score: 13    End of Session Equipment Utilized During Treatment: Rolling walker  OT Visit Diagnosis: Pain;Muscle weakness (generalized) (M62.81);Dizziness and giddiness (R42) Pain - Right/Left: Left Pain - part of body: Leg   Activity Tolerance Patient tolerated treatment well;Patient limited by pain   Patient Left in chair;with call bell/phone within reach;with family/visitor present;with chair alarm set   Nurse Communication Mobility status        Time: 1829-9371 OT Time Calculation (min): 44 min  Charges: OT General Charges $OT Visit: 1 Visit OT Treatments $Therapeutic Activity: 23-37 mins  Eber Jones., OTR/L Acute Rehabilitation Services Pager [phone] Office [phone]    Jeani Hawking M 01/28/2021, 4:20 PM

## 2021-01-28 NOTE — Progress Notes (Signed)
Date and time results received: 01/28/21 0636  Test: Vancomycin trough  Critical Value: 22  Name of Provider Notified: Dr. Johann Capers  Orders Received? None and no response from MD. Reported off to oncoming RN.

## 2021-01-28 NOTE — PMR Pre-admission (Signed)
PMR Admission Coordinator Pre-Admission Assessment  Patient: Louis [name] is an 55 y.o., male MRN: [id number] DOB: 1965/12/03 Height:   Weight: 97.4 kg  Insurance Information HMO:     PPO: yes     PCP:      IPA:      80/20:      OTHER:  PRIMARY: Publishing copy      Policy#: [id number]      Subscriber: pt CM Name: Amy      Phone#: [phone]     Fax#: [phone] Pre-Cert#: [id number] [place] for CIR provided by Amy with updates due to her on 8/15 at fax listed above      Employer:  Benefits:  Phone #: [phone]     Name:  Eff. Date: 06/29/2018     Deduct: $0      Out of Pocket Max: [phone] (met [phone])      Life Max: n/a CIR: $175/day for 5 days      SNF: no benefits Outpatient:      Co-Pay: $30/visit Home Health: 70%      Co-Pay: 30% DME: 70%     Co-Pay: 30% Providers:  SECONDARY:       Policy#:      Phone#:   Development worker, community:       Phone#:   The Therapist, art Information Summary" for patients in Inpatient Rehabilitation Facilities with attached "Privacy Act [place] Records" was provided and verbally reviewed with: N/A  Emergency Contact Information Contact Information     Name Relation Home Work Mobile   Lozito,karen Spouse   [phone]       Current Medical History  Patient Admitting Diagnosis: debility   History of Present Illness: Pt is a 55 y/o male with PMH of DM, HTN, CAD with CABG in 2017, anxiety, and chronic pain.  Presented to [facility] on 7/13 with 5 days of worsening back pain with severe constant bilateral radiation to LEs.  Had previously been at [facility] 6/19 through 7/3 for L3-S1 discitis s/p laminectomy on 6/28 and L psoas abscess on 6/29.  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  TTE at that time was negative.  Following readmit in July, his lumbar MRI showed multiple fluid collections at L4-L5.  Neurosurgery recommended no surgical intervention due to lack of neurological compromise.  TEE negative for vegetation.   IR drained 8m of L3 level fluid.  WBC improving on vancomycin and cefazoline, which were started on 7/21.  Blood cultures have been repeated and have showed no growth since 7/22.  Serial MRIs showed decrease in abscess size and pt is starting to consume enough calories for consideration of d/c Cortrak.  Therapy evaluations were completed and pt was recommended for CIR.      [facility] admission coordinator and physician.  Past Medical History  Past Medical History:  Diagnosis Date   CAD (coronary artery disease)    DM (diabetes mellitus) ([facility]    Hyperlipidemia    Hypertension     Family History   family history is not on file.  Prior Rehab/Hospitalizations Has the patient had prior rehab or hospitalizations prior to admission? Yes  Has the patient had major surgery during 100 days prior to admission? Yes   Current Medications  Current Facility-Administered Medications:    (feeding supplement) PROSource Plus liquid 30 mL, 30 mL, Oral, TID BM, Samtani, Jai-Gurmukh, MD, 30 mL at 01/28/21  1114   acetaminophen (TYLENOL) tablet 650 mg, 650 mg, Oral, Q4H PRN, 650 mg at 01/27/21 2133 **OR** [DISCONTINUED] acetaminophen (TYLENOL) suppository 650 mg, 650 mg, Rectal, Q6H PRN, Stanford Breed, Denice Bors, MD   amLODipine (NORVASC) tablet 5 mg, 5 mg, Oral, Daily, Charlynne Cousins, MD, 5 mg at 01/28/21 1119   aspirin EC tablet 81 mg, 81 mg, Oral, Daily, Lelon Perla, MD, 81 mg at 01/28/21 1119   atorvastatin (LIPITOR) tablet 40 mg, 40 mg, Oral, QHS, Lelon Perla, MD, 40 mg at 01/27/21 2134   diclofenac Sodium (VOLTAREN) 1 % topical gel 1 application, 1 application, Topical, QID PRN, Lelon Perla, MD, 1 application at 35/32/99 1102   diclofenac Sodium (VOLTAREN) 1 % topical gel 2 g, 2 g, Topical, QID, Carlyle Basques, MD, 2 g at 01/28/21 1250   enoxaparin (LOVENOX) injection 40 mg, 40 mg, Subcutaneous, QHS, Crenshaw,  Denice Bors, MD, 40 mg at 01/27/21 2133   feeding supplement (BOOST / RESOURCE BREEZE) liquid 1 Container, 1 Container, Oral, TID BM, Nita Sells, MD, 1 Container at 01/27/21 2134   feeding supplement (GLUCERNA 1.5 CAL) liquid 1,000 mL, 1,000 mL, Per Tube, Q24H, Charlynne Cousins, MD, 1,000 mL at 01/27/21 1810   feeding supplement (PROSource TF) liquid 45 mL, 45 mL, Per Tube, BID, Charlynne Cousins, MD, 45 mL at 24/26/83 4196   folic acid (FOLVITE) tablet 1 mg, 1 mg, Oral, Daily, Charlynne Cousins, MD, 1 mg at 01/28/21 1119   free water 200 mL, 200 mL, Per Tube, Q4H, Charlynne Cousins, MD, 200 mL at 01/28/21 1235   furosemide (LASIX) injection 20 mg, 20 mg, Intravenous, Daily, Samtani, Jai-Gurmukh, MD, 20 mg at 01/28/21 1130   haloperidol lactate (HALDOL) injection 2 mg, 2 mg, Intravenous, Q6H PRN, Charlynne Cousins, MD, 2 mg at 01/24/21 1943   insulin aspart (novoLOG) injection 0-15 Units, 0-15 Units, Subcutaneous, Q4H, Charlynne Cousins, MD, 8 Units at 01/28/21 1247   insulin aspart (novoLOG) injection 4 Units, 4 Units, Subcutaneous, TID WC, Charlynne Cousins, MD, 4 Units at 01/28/21 1247   insulin glargine (LANTUS) injection 5 Units, 5 Units, Subcutaneous, Daily, Aileen Fass, Tammi Klippel, MD, 5 Units at 01/28/21 1113   labetalol (NORMODYNE) injection 10 mg, 10 mg, Intravenous, Q4H PRN, Dwyane Dee, MD   losartan (COZAAR) tablet 25 mg, 25 mg, Oral, q morning, Girguis, David, MD, 25 mg at 01/28/21 1126   MEDLINE mouth rinse, 15 mL, Mouth Rinse, BID, Dwyane Dee, MD, 15 mL at 01/28/21 1121   melatonin tablet 3 mg, 3 mg, Oral, QHS, Charlynne Cousins, MD, 3 mg at 01/27/21 2133   metoprolol succinate (TOPROL-XL) 24 hr tablet 25 mg, 25 mg, Oral, Daily, Lelon Perla, MD, 25 mg at 01/28/21 1120   multivitamin with minerals tablet 1 tablet, 1 tablet, Oral, Daily, Dwyane Dee, MD, 1 tablet at 01/28/21 1119   ondansetron (ZOFRAN) tablet 4 mg, 4 mg, Oral, Q6H PRN **OR**  ondansetron (ZOFRAN) injection 4 mg, 4 mg, Intravenous, Q6H PRN, Lelon Perla, MD, 4 mg at 01/26/21 1449   oxyCODONE (Oxy IR/ROXICODONE) immediate release tablet 5 mg, 5 mg, Oral, Q4H PRN, Dwyane Dee, MD, 5 mg at 01/28/21 1247   oxyCODONE (OXYCONTIN) 12 hr tablet 10 mg, 10 mg, Oral, Q12H, Dwyane Dee, MD, 10 mg at 01/28/21 1119   pantoprazole (PROTONIX) EC tablet 40 mg, 40 mg, Oral, BID, Paytes, Austin A, RPH, 40 mg at 01/28/21 1119   psyllium (HYDROCIL/METAMUCIL) 1 packet, 1  packet, Oral, Daily, Charlynne Cousins, MD, 1 packet at 01/28/21 1116   vancomycin (VANCOREADY) IVPB 1500 mg/300 mL, 1,500 mg, Intravenous, Q12H, Susa Raring, RPH, Last Rate: 150 mL/hr at 01/28/21 0832, 1,500 mg at 01/28/21 5449   venlafaxine XR (EFFEXOR-XR) 24 hr capsule 75 mg, 75 mg, Oral, q morning, Crenshaw, Denice Bors, MD, 75 mg at 01/28/21 1126  Patients Current Diet:  Diet Order             Diet regular Room service appropriate? Yes; Fluid consistency: Thin  Diet effective now                   Precautions / Restrictions Precautions Precautions: Back Precaution Booklet Issued: No Precaution Comments: monitor BP; provided cues to maintain during mobility Restrictions Weight Bearing Restrictions: No   Has the patient had 2 or more falls or a fall with injury in the past year? No  Prior Activity Level Community (5-7x/wk): prior to June admission was independent and working at [employer]; immediately prior to July admission was supervision with RW at home  Prior Functional Level Self Care: Did the patient need help bathing, dressing, using the toilet or eating? Independent  Indoor Mobility: Did the patient need assistance with walking from room to room (with or without device)? Independent  Stairs: Did the patient need assistance with internal or external stairs (with or without device)? Independent  Functional Cognition: Did the patient need help planning regular tasks such as  shopping or remembering to take medications? Independent  Home Assistive Devices / Equipment Home Assistive Devices/Equipment: None Home Equipment: Grab bars - tub/shower, Shower seat - built in, [place] - single point, Environmental consultant - 2 wheels, Bedside commode, Shower seat, Crutches, Wheelchair - manual  Prior Device Use: Indicate devices/aids used by the patient prior to current illness, exacerbation or injury?  RW since June admit, prior to June, no DME  Current Functional Level Cognition  Overall Cognitive Status: Impaired/Different from baseline Current Attention Level: Sustained Orientation Level: Oriented X4 Following Commands: Follows one step commands consistently, Follows one step commands with increased time Safety/Judgement: Decreased awareness of safety, Decreased awareness of deficits General Comments: Pt easily distracted requiring cues to redirect to task he is engaged in.  verbal cues for sequencing and safety    Extremity Assessment (includes Sensation/Coordination)  Upper Extremity Assessment: Generalized weakness  Lower Extremity Assessment: Defer to PT evaluation LLE Deficits / Details: L hip/soas in severe pain    ADLs  Overall ADL's : Needs assistance/impaired Eating/Feeding: Set up Eating/Feeding Details (indicate cue type and reason): pt with poor appetite Grooming: Wash/dry hands, Wash/dry face, Set up, Bed level Upper Body Bathing: Minimal assistance, Bed level Lower Body Bathing: Maximal assistance, Bed level Upper Body Dressing : Moderate assistance, Bed level Lower Body Dressing: Total assistance, Bed level Lower Body Dressing Details (indicate cue type and reason): pt unable to perform figure 4 Toilet Transfer: Minimal assistance, +2 for safety/equipment, Stand-pivot, BSC, RW Toilet Transfer Details (indicate cue type and reason): unable Toileting- Clothing Manipulation and Hygiene: Maximal assistance, Sit to/from stand Functional mobility during ADLs: Minimal  assistance, +2 for safety/equipment, Rolling walker General ADL Comments: Pt severely limited by pain, decreased ability to care for self and decreased safety awareness. Pt sitting at EOB only momentarily before returning to supine due to pain.    Mobility  Overal bed mobility: Needs Assistance Bed Mobility: Rolling, Sidelying to Sit Rolling: Supervision Sidelying to sit: HOB elevated, Min assist Supine to sit: Min  assist, HOB elevated Sit to supine: Mod assist, HOB elevated Sit to sidelying: Min assist General bed mobility comments: Slowly elevated HOB and allowed pt to rest and adjust. Partial prop onto L elbow with legs supported by therapist for comfort to allow pt to adjust to beginning ascent to sit up EOB, minA to complete transition with pt pulling on therapist's hand.    Transfers  Overall transfer level: Needs assistance Equipment used: Rolling walker (2 wheeled) Transfers: Sit to/from Stand Sit to Stand: Min assist, +2 safety/equipment Stand pivot transfers: Mod assist  Lateral/Scoot Transfers: Min guard General transfer comment: MinA to power up to stand, +2 for safety. Cues for hand placement. x1 sit to stand from EOB and x1 from recliner.  Pt is very anxious with mobility requesting a second person to assist    Ambulation / Gait / Stairs / Wheelchair Mobility  Ambulation/Gait Ambulation/Gait assistance: Min assist, +2 safety/equipment Gait Distance (Feet): 8 Feet (x2 bouts of ~5 ft > ~8 ft) Assistive device: Rolling walker (2 wheeled) Gait Pattern/deviations: Step-through pattern, Decreased stride length, Trunk flexed, Shuffle, Decreased dorsiflexion - left General Gait Details: Pt taking small, shuffling steps with poor posture. Cues provided to increase feet clearance, primarily on the L, momentary success. MinAx2 for safety and chair follow, no LOB. Gait velocity: reduced Gait velocity interpretation: <1.31 ft/sec, indicative of household ambulator    Posture /  Balance Dynamic Sitting Balance Sitting balance - Comments: Prefers UE support to reduce pain. Balance Overall balance assessment: Needs assistance Sitting-balance support: Bilateral upper extremity supported, Feet supported, Single extremity supported Sitting balance-Leahy Scale: Poor Sitting balance - Comments: Prefers UE support to reduce pain. Standing balance support: Bilateral upper extremity supported Standing balance-Leahy Scale: Poor Standing balance comment: Reliant on UE support and external assistance for safe mobility.    Special needs/care consideration Continuous Drip IV  Vancomycin 168m/hr q12, Oxygen 2L, and Diabetic management yes   Previous Home Environment (from acute therapy documentation) Living Arrangements: Spouse/significant other Available Help at Discharge: Family, Available 24 hours/day Type of Home: House Home Layout: One level Home Access: Stairs to enter Entrance Stairs-Rails: Can reach both Entrance Stairs-Number of Steps: 4 Bathroom Shower/Tub: W[place]imedia programmer [place] No  Discharge Living Setting Plans for Discharge Living Setting: Lives with (comment) (spouse, KSantiago Glad Type of Home at Discharge: House Discharge Home Layout: One level Discharge Home Access: Stairs to enter Entrance Stairs-Rails: Can reach both Entrance Stairs-Number of Steps: 4 Discharge Bathroom Shower/Tub: Walk-in shower Discharge Bathroom Toilet: Standard Discharge [place] Beach[place]ibility[place] Yes How Accessible: Accessible via walker Does the patient have any problems obtaining your medications?: No  Social/Family/Support Systems Patient Roles: Spouse Anticipated Caregiver: KLoron WeimerAnticipated Caregiver's Contact Information: [phone]/Limitations of Caregiver: supervision to min assist Caregiver Availability: 24/7 Discharge Plan Discussed with Primary Caregiver: Yes Is Caregiver In Agreement with Plan?: Yes Does  Caregiver/Family have Issues with Lodging/Transportation while Pt is in Rehab?: No  Goals Patient/Family Goal for Rehab: PT/OT supervision to mod I, SLP n/a Expected length of stay: 12-15 days Pt/Family Agrees to Admission and willing to participate: Yes Program Orientation Provided & Reviewed with Pt/Caregiver Including Roles  & Responsibilities: Yes  Decrease burden of Care through IP rehab admission: n/a  Possible need for SNF placement upon discharge: Not anticipated  Patient Condition: [facility] spoken with CM, and patient and spouse. I met with patient at the bedside for inpatient rehabilitation assessment.  Patient will benefit from ongoing  PT and OT, can actively participate in 3 hours of therapy a day 5 days of the week, and can make measurable gains during the admission.  Patient will also benefit from the coordinated team approach during an Inpatient Acute Rehabilitation admission.  The patient will receive intensive therapy as well as Rehabilitation physician, nursing, social worker, and care management interventions.  Due to safety, disease management, medication administration, pain management, and patient education the patient requires 24 hour a day rehabilitation nursing.  The patient is currently mod +2 with mobility and basic ADLs.  Discharge setting and therapy post discharge at home with home health is anticipated.  Patient has agreed to participate in the Acute Inpatient Rehabilitation Program and will admit today.  Preadmission Screen Completed By:  Michel Santee, PT, DPT 01/28/2021 4:26 PM ______________________________________________________________________   Discussed status with Dr. Naaman Plummer on 01/28/21  at 4:35 PM  and received approval for admission today.  Admission Coordinator:  Michel Santee, PT, DPT time 4:53 PM Sudie Grumbling 01/28/21    Assessment/Plan: Diagnosis: debility related to lumbar discitis Does the need for close, 24  hr/day Medical supervision in concert with the patient's rehab needs make it unreasonable for this patient to be served in a less intensive setting? Yes Co-Morbidities requiring supervision/potential complications: DM, HTN, CAD, Due to bladder management, bowel management, safety, skin/wound care, disease management, medication administration, pain management, and patient education, does the patient require 24 hr/day rehab nursing? Yes Does the patient require coordinated care of a physician, rehab nurse, PT, OT, and SLP to address physical and functional deficits in the context of the above medical diagnosis(es)? Yes Addressing deficits in the following areas: balance, endurance, locomotion, strength, transferring, bowel/bladder control, bathing, dressing, feeding, grooming, toileting, and psychosocial support Can the patient actively participate in an intensive therapy program of at least 3 hrs of therapy 5 days a week? Yes The potential for patient to make measurable gains while on inpatient rehab is excellent Anticipated functional outcomes upon discharge from inpatient rehab: modified independent PT, modified independent OT, n/a SLP Estimated rehab length of stay to reach the above functional goals is: 12=15 days Anticipated discharge destination: Home 10. Overall Rehab/Functional Prognosis: excellent   MD Signature: Meredith Staggers, MD, [facility] 01/28/2021

## 2021-01-29 DIAGNOSIS — E669 Obesity, unspecified: Secondary | ICD-10-CM

## 2021-01-29 DIAGNOSIS — R5381 Other malaise: Principal | ICD-10-CM

## 2021-01-29 DIAGNOSIS — G062 Extradural and subdural abscess, unspecified: Secondary | ICD-10-CM | POA: Diagnosis not present

## 2021-01-29 DIAGNOSIS — E871 Hypo-osmolality and hyponatremia: Secondary | ICD-10-CM

## 2021-01-29 DIAGNOSIS — R2689 Other abnormalities of gait and mobility: Secondary | ICD-10-CM | POA: Diagnosis not present

## 2021-01-29 DIAGNOSIS — R1312 Dysphagia, oropharyngeal phase: Secondary | ICD-10-CM | POA: Diagnosis not present

## 2021-01-29 DIAGNOSIS — E1169 Type 2 diabetes mellitus with other specified complication: Secondary | ICD-10-CM | POA: Diagnosis not present

## 2021-01-29 DIAGNOSIS — I5032 Chronic diastolic (congestive) heart failure: Secondary | ICD-10-CM | POA: Diagnosis not present

## 2021-01-29 DIAGNOSIS — M4626 Osteomyelitis of vertebra, lumbar region: Secondary | ICD-10-CM | POA: Diagnosis not present

## 2021-01-29 LAB — GLUCOSE, CAPILLARY
Glucose-Capillary: 169 mg/dL — ABNORMAL HIGH (ref 70–99)
Glucose-Capillary: 171 mg/dL — ABNORMAL HIGH (ref 70–99)
Glucose-Capillary: 178 mg/dL — ABNORMAL HIGH (ref 70–99)
Glucose-Capillary: 179 mg/dL — ABNORMAL HIGH (ref 70–99)
Glucose-Capillary: 90 mg/dL (ref 70–99)
Glucose-Capillary: 92 mg/dL (ref 70–99)

## 2021-01-29 MED ORDER — INSULIN GLARGINE-YFGN 100 UNIT/ML ~~LOC~~ SOLN
10.0000 [IU] | Freq: Every day | SUBCUTANEOUS | Status: DC
  Administered 2021-01-30 – 2021-02-03 (×5): 10 [IU] via SUBCUTANEOUS
  Filled 2021-01-29 (×5): qty 0.1

## 2021-01-29 MED ORDER — INSULIN GLARGINE-YFGN 100 UNIT/ML ~~LOC~~ SOLN
5.0000 [IU] | Freq: Once | SUBCUTANEOUS | Status: AC
  Administered 2021-01-29: 5 [IU] via SUBCUTANEOUS
  Filled 2021-01-29: qty 0.05

## 2021-01-29 NOTE — Evaluation (Signed)
Occupational Therapy Assessment and Plan  Patient Details  Name: Louis [name] MRN: [id number] Date of Birth: May 16, 1966  OT Diagnosis: abnormal posture, lumbago (low back pain), muscle weakness (generalized), and swelling of limb Rehab Potential:   ELOS: 10-14 days   Today's Date: 01/29/2021 OT Individual Time: 1300-1400   &   1445-1505 OT Individual Time Calculation (min): 60 min   &   20 min, missed 25 minutes due to significant fatigue and nausea - nursing aware  Hospital Problem: Principal Problem:   Debility Active Problems:   Epidural abscess   Past Medical History:  Past Medical History:  Diagnosis Date   CAD (coronary artery disease)    DM (diabetes mellitus) ([place])    Hyperlipidemia    Hypertension    Past Surgical History:  Past Surgical History:  Procedure Laterality Date   LUMBAR LAMINECTOMY FOR EPIDURAL ABSCESS N/A 12/21/2020   Procedure: Thoracic seven-eight, Thoracic twelve-Lumbar one, Lumbar three-four laminectomy for Epidural Abscess;  Surgeon: Karsten Ro, DO;  Location: [facility];  Service: Neurosurgery;  Laterality: N/A;   TEE WITHOUT CARDIOVERSION N/A 01/11/2021   Procedure: TRANSESOPHAGEAL ECHOCARDIOGRAM (TEE);  Surgeon: Lelon Perla, MD;  Location: [facility] ENDOSCOPY;  Service: Cardiovascular;  Laterality: N/A;    Assessment & Plan Clinical Impression: Patient is a 55 y.o. year old male with history of diabetes, hypertension, CAD with CABG in 2017, and chronic pain.  He presented to [facility] on July 13 with 5 days of increasing back pain radiating to both lower extremities.  History is also notable for a lumbar laminectomy in late June for L3-S1 discitis and associated psoas abscess.  The plan was for daptomycin through August 10 for MRSA coverage.  Upon readmission in July lumbarized showed multiple fluid collections at L4-L5.  Neurosurgery was consulted in did not recommend surgical intervention.  TEE negative for vegetation.  Interventional radiology  drained 5 cc of fluid at the L3 level.  Patient was placed on vancomycin and cefazolin on July 21.  Recent blood cultures are negative and white blood cell counts are improving.  Serial imaging has demonstrated decrease in abscess size.  Patient was receiving core track feeds for some time but now has begun on p.o. diet.  Patient has been mobilized by therapies and demonstrated significant functional deficits in regards to self-care and basic mobility.  He was felt that he would benefit from an inpatient rehab stay to maximize functional gains while medical issues were managed in the hospital setting.   Patient transferred to [facility] on 01/28/2021 .    Patient currently requires max with basic self-care skills secondary to muscle weakness, decreased cardiorespiratoy endurance, and decreased sitting balance, decreased standing balance, and decreased postural control.  Prior to hospitalization, patient could complete ADL with modified independent .  Patient will benefit from skilled intervention to decrease level of assist with basic self-care skills, increase independence with basic self-care skills, and increase level of independence with iADL prior to discharge home with care partner.  Anticipate patient will require intermittent supervision and follow up home health.  OT - End of Session Activity Tolerance: Tolerates 10 - 20 min activity with multiple rests Endurance Deficit: Yes Endurance Deficit Description: fatigue noted with light activity OT Assessment OT Patient demonstrates impairments in the following area(s): Balance;Edema;Endurance;Motor;Pain;Safety OT Basic ADL's Functional Problem(s): Grooming;Bathing;Toileting;Dressing OT Advanced ADL's Functional Problem(s): Simple Meal Preparation OT Transfers Functional Problem(s): Toilet;Tub/Shower OT Plan OT Intensity: Minimum of 1-2 x/day, 45 to 90 minutes OT Frequency: 5 out  of 7 days OT Duration/Estimated Length of Stay: 10-14 days OT  Treatment/Interventions: Balance/vestibular training;Self Care/advanced ADL retraining;Therapeutic Exercise;DME/adaptive equipment instruction;Pain management;UE/LE Strength taining/ROM;Patient/family education;Discharge planning;Functional mobility training;Therapeutic Activities OT Self Feeding Anticipated Outcome(s): independent OT Basic Self-Care Anticipated Outcome(s): set up/supervision OT Toileting Anticipated Outcome(s): supervision OT Bathroom Transfers Anticipated Outcome(s): supervision OT Recommendation Patient destination: Home Follow Up Recommendations: Home health OT Equipment Details: owns walker, commode, shower seat   OT Evaluation Precautions/Restrictions  Precautions Precautions: Back Precaution Booklet Issued: No Precaution Comments: Pt unable to recall 3/3 back precautions Restrictions Weight Bearing Restrictions: No General   Vital Signs Therapy Vitals Temp: 98.5 F (36.9 C) Temp Source: Oral Pulse Rate: 86 Resp: 17 BP: 135/87 Patient Position (if appropriate): Sitting Oxygen Therapy SpO2: 98 % O2 Device: Room Air Pain Pain Assessment Pain Scale: 0-10 Pain Score: 9  Faces Pain Scale: Hurts little more Pain Type: Acute pain Pain Location: Back Pain Orientation: Lower Pain Radiating Towards: legs Pain Descriptors / Indicators: Discomfort Pain Frequency: Constant Pain Onset: On-going Pain Intervention(s): Repositioned Multiple Pain Sites: Yes 2nd Pain Site Wong-Baker Pain Rating: Hurts little more Pain Type: Acute pain Pain Location: Leg Pain Orientation: Right;Left Pain Onset: On-going Home Living/Prior Functioning Home Living Family/patient expects to be discharged to:: Private residence Living Arrangements: Spouse/significant other Available Help at Discharge: Family, Available 24 hours/day Type of Home: House Home Access: Stairs to enter Technical brewer of Steps: 4 Entrance Stairs-Rails: Can reach both Home Layout: One  level Bathroom Shower/Tub: Multimedia programmer: Standard Bathroom Accessibility: Yes  Lives With: Spouse Prior Function Level of Independence: Independent with basic ADLs, Independent with gait, Independent with transfers, Independent with homemaking with ambulation  Able to Take Stairs?: Yes Driving: Yes Vocation: Full time employment Comments: works at [employer] Baseline Vision/History: No visual deficits Patient Visual Report: No change from baseline Vision Assessment?: No apparent visual deficits Eye Alignment: Within Functional Limits Ocular Range of Motion: Within Functional Limits Alignment/Gaze Preference: Within Defined Limits Tracking/Visual Pursuits: Able to track stimulus in all quads without difficulty Saccades: Within functional limits Convergence: Within functional limits Visual Fields: No apparent deficits Perception  Perception: Within Functional Limits Praxis Praxis: Intact Cognition Overall Cognitive Status: Within Functional Limits for tasks assessed Arousal/Alertness: Awake/alert Orientation Level: Person;Place;Situation Person: Oriented Place: Oriented Situation: Oriented Year: 2022 Month: August Day of Week: Correct Memory: Appears intact Immediate Memory Recall: Sock;Blue;Bed Memory Recall Blue: Without Cue Memory Recall Bed: Not able to recall Problem Solving: Appears intact Behaviors: Other (comment) (Demonstrates anxiety with any mobility) Safety/Judgment: Impaired Comments: Pt requires assist maintaining back precautions with mobility. Requires assist to monitor dizziness/light headed symptoms and cuing for positional changes to ensure safety. Sensation Sensation Light Touch: Appears Intact Coordination Gross Motor Movements are Fluid and Coordinated: No Fine Motor Movements are Fluid and Coordinated: Yes Heel Shin Test: unable to perform on left; slowed movement on right Motor  Motor Motor: Other (comment)  (Increased time with movements 2/2 pain/anxiety. Weakness LLE>RLE)  Trunk/Postural Assessment  Cervical Assessment Cervical Assessment: Within Functional Limits Thoracic Assessment Thoracic Assessment: Exceptions to [facility] (mildly rounded shoulders) Lumbar Assessment Lumbar Assessment: Exceptions to [facility] (mild posterior pelvic tilt) Postural Control Protective Responses: delayed Postural Limitations: decreased  Balance Balance Balance Assessed: Yes Static Sitting Balance Static Sitting - Balance Support: Bilateral upper extremity supported (Pt perfers BUE support 2/2 light headed and dizziness symtoms) Static Sitting - Level of Assistance: 5: Stand by assistance Dynamic Sitting Balance Dynamic Sitting - Balance Support: Bilateral upper extremity supported Dynamic  Sitting - Level of Assistance: 5: Stand by assistance Sitting balance - Comments: Prefers BUE. Sitting balance with lower body dressing Static Standing Balance Static Standing - Level of Assistance: 4: Min assist Extremity/Trunk Assessment RUE Assessment RUE Assessment: Within Functional Limits LUE Assessment LUE Assessment: Within Functional Limits  Care Tool Care Tool Self Care Eating        Oral Care    Oral Care Assist Level: Set up assist    Bathing Bathing activity did not occur: Refused            Upper Body Dressing(including orthotics)   What is the patient wearing?: Pull over shirt   Assist Level: Maximal Assistance - Patient 25 - 49%    Lower Body Dressing (excluding footwear)   What is the patient wearing?: Pants;Underwear/pull up Assist for lower body dressing: Total Assistance - Patient < 25%    Putting on/Taking off footwear   What is the patient wearing?: Non-skid slipper socks;Ted hose Assist for footwear: Dependent - Patient 0%       Care Tool Toileting Toileting activity   Assist for toileting: Set up assist     Care Tool Bed Mobility Roll left and right activity        Sit to  lying activity        Lying to sitting edge of bed activity         Care Tool Transfers Sit to stand transfer        Chair/bed transfer         Toilet transfer         Care Tool Cognition Expression of Ideas and Wants Expression of Ideas and Wants: Without difficulty (complex and basic) - expresses complex messages without difficulty and with speech that is clear and easy to understand   Understanding Verbal and Non-Verbal Content Understanding Verbal and Non-Verbal Content: Understands (complex and basic) - clear comprehension without cues or repetitions   Memory/Recall Ability *first 3 days only Memory/Recall Ability *first 3 days only: Current season;That he or she is in a hospital/hospital unit    Refer to Care Plan for [place] 1 OT Short Term Goal 1 (Week 1): Patient will complete bed mobility and functional transfers with CGA OT Short Term Goal 2 (Week 1): patient will complete bathing and toileting with min A OT Short Term Goal 3 (Week 1): patient will complete dressing tasks with min A using assistive devices with good carryover OT Short Term Goal 4 (Week 1): patient will tolerate standing activities and basic HM for 5-10 minutes with CGA  Recommendations for other services: None    Skilled Therapeutic Intervention  Patient seated in recliner, pleasant and cooperative.  Evaluation completed as documented above.  He presents with generalized weakness, significant edema and malaise limiting ability to complete all aspects of self care and mobility.  Reviewed role of OT, plan of care and goals for therapy program - he demonstrates good understanding and agrees with plan.  He declined bathing this session due to fatigue.  Sit to stand and SPT with RW tolerated with min A.  To therapy gym via w/c.  Completed light upper body exercise with significant fatigue noted and rest breaks needed between tasks.  Upperbody ergometer 2 x 2 minutes.  Reviewed  and practiced use of reacher and sock aide.  He remained seated in w/c at close of session, seat belt alarm set and call bell in hand.    Second  session:   patient seated in w/c - he notes significant fatigue, chills, not feeling well and nausea - nursing aware - vitals checked and within parameters.  Assisted back to bed as he has been up for much of the day.  SPT with RW w/c to bed with min A.  Able to tolerate sitting edge of bed with CS.  Sit to supine max A for bilateral leg management, min A to scoot up in bed.  Teds removed.  Patient notes some relief with change of position but still not feeling well - ended session with call bell and tray table in reach.     ADL ADL Eating: Set up Where Assessed-Eating: Chair Grooming: Setup Where Assessed-Grooming: Chair Upper Body Bathing: Not assessed Lower Body Bathing: Not assessed Upper Body Dressing: Minimal assistance Where Assessed-Upper Body Dressing: Bed level Lower Body Dressing: Maximal assistance Where Assessed-Lower Body Dressing: Bed level ADL Comments: patient declined shower / bathing due to fatigue, able to use urinal with set up Mobility  Bed Mobility Bed Mobility: Sit to Supine Rolling Right: Minimal Assistance - Patient > 75% Sit to Supine: Moderate Assistance - Patient 50-74% Transfers Sit to Stand: Minimal Assistance - Patient > 75% Stand to Sit: Minimal Assistance - Patient > 75%   Discharge Criteria: Patient will be discharged from OT if patient refuses treatment 3 consecutive times without medical reason, if treatment goals not met, if there is a change in medical status, if patient makes no progress towards goals or if patient is discharged from hospital.  The above assessment, treatment plan, treatment alternatives and goals were discussed and mutually agreed upon: by patient  Carlos Levering 01/29/2021, 4:23 PM

## 2021-01-29 NOTE — Evaluation (Signed)
Physical Therapy Assessment and Plan  Patient Details  Name: Louis [name] MRN: [id number] Date of Birth: [birth year]  PT Diagnosis: Abnormal posture, Abnormality of gait, Coordination disorder, Difficulty walking, Dizziness and giddiness, Low back pain, Muscle weakness, and Pain in bilateral lower extremities. Rehab Potential: Good ELOS: 12-15 days   Today's Date: 01/29/2021 PT Individual Time: 1005-1109; 6761-9509 PT Individual Time Calculation (min): 64 min; 30 min   Hospital Problem: Principal Problem:   Debility Active Problems:   Epidural abscess   Past Medical History:  Past Medical History:  Diagnosis Date   CAD (coronary artery disease)    DM (diabetes mellitus) ([place])    Hyperlipidemia    Hypertension    Past Surgical History:  Past Surgical History:  Procedure Laterality Date   LUMBAR LAMINECTOMY FOR EPIDURAL ABSCESS N/A 12/21/2020   Procedure: Thoracic seven-eight, Thoracic twelve-Lumbar one, Lumbar three-four laminectomy for Epidural Abscess;  Surgeon: Karsten Ro, DO;  Location: [facility];  Service: Neurosurgery;  Laterality: N/A;   TEE WITHOUT CARDIOVERSION N/A 01/11/2021   Procedure: TRANSESOPHAGEAL ECHOCARDIOGRAM (TEE);  Surgeon: Lelon Perla, MD;  Location: [facility] ENDOSCOPY;  Service: Cardiovascular;  Laterality: N/A;    Assessment & Plan Clinical Impression: HPI: This is a 55 year old male with a history of diabetes, hypertension, CAD with CABG in 2017, and chronic pain.  He presented to [facility] on July 13 with 5 days of increasing back pain radiating to both lower extremities.  History is also notable for a lumbar laminectomy in late June for L3-S1 discitis and associated psoas abscess.  The plan was for daptomycin through August 10 for MRSA coverage.  Upon readmission in July lumbarized showed multiple fluid collections at L4-L5.  Neurosurgery was consulted in did not recommend surgical intervention.  TEE negative for vegetation.  Interventional radiology  drained 5 cc of fluid at the L3 level.  Patient was placed on vancomycin and cefazolin on July 21.  Recent blood cultures are negative and white blood cell counts are improving.  Serial imaging has demonstrated decrease in abscess size.  Patient was receiving core track feeds for some time but now has begun on p.o. diet.  Patient has been mobilized by therapies and demonstrated significant functional deficits in regards to self-care and basic mobility.  He was felt that he would benefit from an inpatient rehab stay to maximize functional gains while medical issues were managed in the hospital setting. Patient transferred to CIR on 01/28/2021 .   Patient currently requires mod with mobility secondary to muscle weakness, decreased cardiorespiratoy endurance, and decreased sitting balance, decreased standing balance, decreased postural control, decreased balance strategies, and difficulty maintaining precautions.  Prior to hospitalization, patient was independent  with mobility and lived with Spouse in a House home.  Home access is 4Stairs to enter.  Patient will benefit from skilled PT intervention to maximize safe functional mobility, minimize fall risk, and decrease caregiver burden for planned discharge home with 24 hour supervision.  Anticipate patient will benefit from follow up [place] at discharge.  PT - End of Session Activity Tolerance: Tolerates < 10 min activity, no significant change in vital signs Endurance Deficit: Yes Endurance Deficit Description: Pt required increased time to complete tasks and rest breaks during/in between tasks PT Assessment Rehab Potential (ACUTE/IP ONLY): Good PT Barriers to Discharge: Decreased caregiver support;Behavior;Nutrition means;Incontinence PT Patient demonstrates impairments in the following area(s): Balance;Behavior;Endurance;Motor;Nutrition;Pain;Safety;Skin Integrity PT Transfers Functional Problem(s): Bed Mobility;Bed to Chair;Car;Furniture PT Locomotion  Functional Problem(s): Ambulation;Wheelchair Mobility;Stairs PT Plan PT Intensity:  Minimum of 1-2 x/day ,45 to 90 minutes PT Frequency: 5 out of 7 days PT Duration Estimated Length of Stay: 12-15 days PT Treatment/Interventions: Ambulation/gait training;Discharge planning;Functional mobility training;Psychosocial support;Therapeutic Activities;Visual/perceptual remediation/compensation;Balance/vestibular training;Disease management/prevention;Neuromuscular re-education;Skin care/wound management;Therapeutic Exercise;Wheelchair propulsion/positioning;Cognitive remediation/compensation;DME/adaptive equipment instruction;Pain management;Splinting/orthotics;UE/LE Strength taining/ROM;Community reintegration;Patient/family education;Functional electrical stimulation;Stair training;UE/LE Coordination activities PT Transfers Anticipated Outcome(s): ModI PT Locomotion Anticipated Outcome(s): Ambulatory Supervision with LRAD PT Recommendation Follow Up Recommendations: Home health PT Patient destination: Home Equipment Recommended: To be determined   PT Evaluation Precautions/Restrictions Precautions Precautions: Back Precaution Booklet Issued: No Precaution Comments: Pt unable to recall 3/3 back precautions Restrictions Weight Bearing Restrictions: No Pain Pain Assessment Pain Scale: Faces Pain Score: 9  Faces Pain Scale: Hurts little more Pain Type: Acute pain Pain Location: Back Pain Orientation: Right;Left;Mid;Lower Pain Descriptors / Indicators: Aching Pain Onset: On-going Multiple Pain Sites: Yes 2nd Pain Site Wong-Baker Pain Rating: Hurts little more Pain Type: Acute pain Pain Location: Leg Pain Orientation: Right;Left Pain Onset: On-going Home Living/Prior Functioning Home Living Available Help at Discharge: Family;Available 24 hours/day Type of Home: House Home Access: Stairs to enter [employer] of Steps: 4 Entrance Stairs-Rails: Can reach both Home Layout:  One level Bathroom Shower/Tub: Multimedia programmer: Standard Bathroom Accessibility: Yes  Lives With: Spouse Prior Function Level of Independence: Independent with basic ADLs;Independent with gait;Independent with transfers;Independent with homemaking with ambulation  Able to Take Stairs?: Yes Driving: Yes Vocation: Full time employment Comments: works at [employer] office Vision/Perception  Vision - Assessment Eye Alignment: Within Functional Limits Ocular Range of Motion: Within Functional Limits Alignment/Gaze Preference: Within Defined Limits Tracking/Visual Pursuits: Able to track stimulus in all quads without difficulty Saccades: Within functional limits Convergence: Within functional limits Perception Perception: Within Functional Limits Praxis Praxis: Intact  Cognition Overall Cognitive Status: Within Functional Limits for tasks assessed Arousal/Alertness: Awake/alert Orientation Level: Oriented X4 Behaviors: Other (comment) (Demonstrates anxiety with any mobility) Safety/Judgment: Impaired Comments: Pt requires assist maintaining back precautions with mobility. Requires assist to monitor dizziness/light headed symptoms and cuing for positional changes to ensure safety. Sensation Sensation Light Touch: Appears Intact Coordination Gross Motor Movements are Fluid and Coordinated: No Heel Shin Test: unable to perform on left; slowed movement on right Motor  Motor Motor: Other (comment) (Increased time with movements 2/2 pain/anxiety. Weakness LLE>RLE)   Trunk/Postural Assessment  Cervical Assessment Cervical Assessment: Within Functional Limits Thoracic Assessment Thoracic Assessment: Exceptions to [facility] (mildly rounded shoulders) Lumbar Assessment Lumbar Assessment: Exceptions to [facility] (mild posterior pelvic tilt) Postural Control Protective Responses: delayed Postural Limitations: decreased  Balance Balance Balance Assessed: Yes Static Sitting  Balance Static Sitting - Balance Support: Bilateral upper extremity supported (Pt perfers BUE support 2/2 light headed and dizziness symtoms) Static Sitting - Level of Assistance: 5: Stand by assistance Dynamic Sitting Balance Dynamic Sitting - Balance Support: Bilateral upper extremity supported Dynamic Sitting - Level of Assistance: 5: Stand by assistance Sitting balance - Comments: Prefers BUE. Sitting balance with lower body dressing Extremity Assessment   RLE Assessment RLE Assessment: Exceptions to [facility] RLE Strength Right Hip Flexion: 4-/5 Right Hip ABduction: 4/5 Right Hip ADduction: 4/5 Right Knee Flexion: 4/5 Right Knee Extension: 4-/5 Right Ankle Dorsiflexion: 4/5 LLE Assessment LLE Assessment: Exceptions to [facility] LLE Strength Left Hip Flexion: 3/5 Left Hip ABduction: 4-/5 Left Hip ADduction: 4-/5 Left Knee Flexion: 4-/5 Left Knee Extension: 3+/5 Left Ankle Dorsiflexion: 3+/5  Care Tool Care Tool Bed Mobility Roll left and right activity   Roll left and right assist level: Minimal Assistance -  Patient > 75%    Sit to lying activity   Sit to lying assist level: Maximal Assistance - Patient 25 - 49% (slowed movements to allow for adjustment to upright positioning. Assist with BLE then trunk transitioning)    Lying to sitting edge of bed activity   Lying to sitting edge of bed assist level: Maximal Assistance - Patient 25 - 49%     Care Tool Transfers Sit to stand transfer   Sit to stand assist level: Minimal Assistance - Patient > 75%    Chair/bed transfer   Chair/bed transfer assist level: Moderate Assistance - Patient 50 - 74%     Toilet transfer Toilet transfer activity did not occur: N/A      Scientist, product/process development transfer activity did not occur: Safety/medical concerns        Care Tool Locomotion Ambulation   Assist level: 2 helpers (MinA overall, +2 wc/IV pole follow for safety) Assistive device: Walker-rolling Max distance: 6  Walk 10 feet activity  Walk 10 feet activity did not occur: Safety/medical concerns       Walk 50 feet with 2 turns activity Walk 50 feet with 2 turns activity did not occur: Safety/medical concerns      Walk 150 feet activity Walk 150 feet activity did not occur: Safety/medical concerns      Walk 10 feet on uneven surfaces activity Walk 10 feet on uneven surfaces activity did not occur: Safety/medical concerns      Stairs Stair activity did not occur: Safety/medical concerns        Walk up/down 1 step activity Walk up/down 1 step or curb (drop down) activity did not occur: Safety/medical concerns     Walk up/down 4 steps activity did not occuR: Safety/medical concerns  Walk up/down 4 steps activity      Walk up/down 12 steps activity Walk up/down 12 steps activity did not occur: Safety/medical concerns      Pick up small objects from floor Pick up small object from the floor (from standing position) activity did not occur: Safety/medical concerns      Wheelchair Will patient use wheelchair at discharge?: No     Wheelchair assist level: Contact Guard/Touching assist Max wheelchair distance: 150  Wheel 50 feet with 2 turns activity   Assist Level: Contact Guard/Touching assist  Wheel 150 feet activity   Assist Level: Contact Guard/Touching assist    Refer to Care Plan for Long Term Goals  SHORT TERM GOAL WEEK 1 PT Short Term Goal 1 (Week 1): Pt will ambulate 2ft minA PT Short Term Goal 2 (Week 1): Pt will complete sit to stand CGA PT Short Term Goal 3 (Week 1): Pt will be able to recall 3/3 back precautions PT Short Term Goal 4 (Week 1): Pt will be able to complete bed mobility MinA PT Short Term Goal 5 (Week 1): Pt will initiate stair training   Recommendations for other services: None   Skilled Therapeutic Intervention Session one:  Pt received in bed with HOB elevated. Pt agreeable to therapy and requests assistance with changing saturated pants. TotalA for lower body dressing 2/2  back precautions while in stedy. Pt required increased time to complete bed mobility and transition to standing due to increased in dizziness/light headed/nauseous symptoms and pain.   Evaluation completed (see details above) with patient education regarding purpose of PT evaluation, PT POC and goals, therapy schedule, weekly team meetings, and other CIR information including safety plan and fall risk safety.  Orthostatic vitals  taken: 126/81 lying  138/84 Sitting  124/82 Standing   Pt completed wheelchair mobility with BUE technique with supervision and cuing for navigation of obstacles in halls x ~115ft.   Gait training 1x30ft MinA and +2 for close wc follow / IV pole management. Pt demonstrates decreased stride length, decreased LLE DF and hip/knee flexion. Verbal/tactile cuing for posture and management of RW. Therapists instructed pt to terminate ambulation due to increased pallor and unsteadiness.  Pt positioned in recliner via stedy, brakes locked, call bell within reach.  IV pole reattached to wall outlets. All needs addressed at this time.   Session two:  Pt received in bed in semi-reclined position. Pt states not feeling well, "swimmy headed." Agreeable to bed level activity.   Exercises completed with bilateral LE. Tactile cuing provided with LLE 2/2 weakness for improved muscle recruitment.  Heel slides x10  Ankle Pumps x20  Hip ABD x12  Hip ADD (squeeze into pillow) x12, 3 second hold  SAQ x12, 3 second hold   Pt required frequent cuing for attention to task, often closing eyes from fatigue. Pt in bed with call bell and tray table next to him request. IV pole plugged into wall outlets. All needs met at this time.   Mobility Bed Mobility Bed Mobility: Rolling Right Rolling Right: Minimal Assistance - Patient > 75% Transfers Transfers: Sit to Stand;Stand to Sit;Transfer via Atlas to Stand: Minimal Assistance - Patient > 75% Stand to Sit: Minimal Assistance -  Patient > 75% Transfer via Lift Equipment: Animal nutritionist: Yes Gait Assistance: Minimal Assistance - Patient > 75%;2 Helpers (+2 Close wc follow and IV pole for safety) Gait Distance (Feet): 6 Feet Assistive device: Rolling walker Gait Assistance Details: Tactile cues for posture;Verbal cues for precautions/safety;Verbal cues for safe use of DME/AE Gait Gait: Yes Gait Pattern: Step-through pattern;Decreased stride length;Shuffle;Trunk flexed Gait velocity: decreased Stairs / Additional Locomotion Stairs: No Architect: Yes Wheelchair Assistance: Development worker, international aid: Both upper extremities Wheelchair Parts Management: Needs assistance Distance: 150   Discharge Criteria: Patient will be discharged from PT if patient refuses treatment 3 consecutive times without medical reason, if treatment goals not met, if there is a change in medical status, if patient makes no progress towards goals or if patient is discharged from hospital.  The above assessment, treatment plan, treatment alternatives and goals were discussed and mutually agreed upon: by patient  Angell Pincock, SPT 01/29/2021, 12:57 PM

## 2021-01-29 NOTE — H&P (Signed)
Physical Medicine and Rehabilitation Admission H&P        Chief Complaint  Patient presents with   Back Pain  : HPI: This is a 55 year old male with a history of diabetes, hypertension, CAD with CABG in 2017, and chronic pain.  He presented to [facility] on July 13 with 5 days of increasing back pain radiating to both lower extremities.  History is also notable for a lumbar laminectomy in late June for L3-S1 discitis and associated psoas abscess.  The plan was for daptomycin through August 10 for MRSA coverage.  Upon readmission in July lumbarized showed multiple fluid collections at L4-L5.  Neurosurgery was consulted in did not recommend surgical intervention.  TEE negative for vegetation.  Interventional radiology drained 5 cc of fluid at the L3 level.  Patient was placed on vancomycin and cefazolin on July 21.  Recent blood cultures are negative and white blood cell counts are improving.  Serial imaging has demonstrated decrease in abscess size.  Patient was receiving core track feeds for some time but now has begun on p.o. diet.  Patient has been mobilized by therapies and demonstrated significant functional deficits in regards to self-care and basic mobility.  He was felt that he would benefit from an inpatient rehab stay to maximize functional gains while medical issues were managed in the hospital setting.   ROS     Past Medical History:  Diagnosis Date   CAD (coronary artery disease)     DM (diabetes mellitus) (HCC)     Hyperlipidemia     Hypertension           Past Surgical History:  Procedure Laterality Date   LUMBAR LAMINECTOMY FOR EPIDURAL ABSCESS N/A 12/21/2020    Procedure: Thoracic seven-eight, Thoracic twelve-Lumbar one, Lumbar three-four laminectomy for Epidural Abscess;  Surgeon: Dawley, Alan Mulder, DO;  Location: [facility] OR;  Service: Neurosurgery;  Laterality: N/A;   TEE WITHOUT CARDIOVERSION N/A 01/11/2021    Procedure: TRANSESOPHAGEAL ECHOCARDIOGRAM (TEE);   Surgeon: Lewayne Bunting, MD;  Location: [facility] ENDOSCOPY;  Service: Cardiovascular;  Laterality: N/A;    History reviewed. No pertinent family history. Social History:  reports that he has been smoking cigarettes. He started smoking about 25 years ago. He has been smoking an average of 2 packs per day. He has never used smokeless tobacco. He reports previous alcohol use. He reports that he does not use drugs. Allergies: No Known Allergies       Medications Prior to Admission  Medication Sig Dispense Refill   acetaminophen (TYLENOL) 325 MG tablet Take 2 tablets (650 mg total) by mouth every 6 (six) hours as needed for mild pain (or Fever >/= 101). 30 tablet 0   ALPRAZolam (XANAX) 0.25 MG tablet Take 0.25 mg by mouth 2 (two) times daily as needed for anxiety.       aspirin EC 81 MG tablet Take 81 mg by mouth daily. Swallow whole.       atorvastatin (LIPITOR) 40 MG tablet Take 1 tablet (40 mg total) by mouth at bedtime. 30 tablet 0   dapagliflozin propanediol (FARXIGA) 10 MG TABS tablet Take 10 mg by mouth every morning.       daptomycin (CUBICIN) IVPB Inject 750 mg into the vein daily. Indication:  MRSA bacteremia/lumbar osteomyelitis First Dose: Yes Last Day of Therapy:  02/02/21 Labs - Once weekly:  CBC/D, BMP, and CPK Labs - Every other week:  ESR and CRP Method of administration: IV Push Method  of administration may be changed at the discretion of home infusion pharmacist based upon assessment of the patient and/or caregiver's ability to self-administer the medication ordered. 41 Units 0   diclofenac Sodium (VOLTAREN) 1 % GEL Apply 1 application topically 4 (four) times daily as needed (pain).       furosemide (LASIX) 20 MG tablet Take 20 mg by mouth every morning.       gabapentin (NEURONTIN) 100 MG capsule Take 2 capsules (200 mg total) by mouth 3 (three) times daily. 180 capsule 0   glipiZIDE (GLUCOTROL) 5 MG tablet Take 5 mg by mouth 2 (two) times daily.       icosapent Ethyl (VASCEPA) 1  g capsule Take 2 g by mouth 2 (two) times daily.       lactulose (CHRONULAC) 10 GM/15ML solution Take 15 mLs (10 g total) by mouth daily. 236 mL 0   lidocaine (LIDODERM) 5 % Place 1 patch onto the skin daily as needed (pain). Remove & Discard patch within 12 hours or as directed by MD       losartan (COZAAR) 25 MG tablet Take 25 mg by mouth every morning.       metFORMIN (GLUCOPHAGE) 500 MG tablet Take 500-1,000 mg by mouth See admin instructions. Take 2 tablets (1000 mg) by mouth every morning and 1 tablet (500 mg) at night       methocarbamol (ROBAXIN) 500 MG tablet Take 1 tablet (500 mg total) by mouth every 6 (six) hours as needed for muscle spasms. 20 tablet 0   metoprolol tartrate (LOPRESSOR) 25 MG tablet Take 25 mg by mouth at bedtime.       omeprazole (PRILOSEC) 20 MG capsule Take 20 mg by mouth every morning.       oxyCODONE (OXYCONTIN) 20 mg 12 hr tablet Take 1 tablet (20 mg total) by mouth every 12 (twelve) hours. 14 tablet 0   oxyCODONE (ROXICODONE) 15 MG immediate release tablet Take 1 tablet (15 mg total) by mouth every 3 (three) hours as needed for severe pain ((score 7 to 10)). 30 tablet 0   polyethylene glycol (MIRALAX / GLYCOLAX) 17 g packet Take 17 g by mouth 2 (two) times daily. 14 each 0   venlafaxine XR (EFFEXOR-XR) 75 MG 24 hr capsule Take 75 mg by mouth every morning.          Drug Regimen Review  Drug regimen was reviewed and remains appropriate with no significant issues identified   Home: Home Living Family/patient expects to be discharged to:: Private residence Living Arrangements: Spouse/significant other Available Help at Discharge: Family, Available 24 hours/day Type of Home: House Home Access: Stairs to enter [employer] of Steps: 4 Entrance Stairs-Rails: Can reach both Home Layout: One level Bathroom Shower/Tub: Multimedia programmer: Standard Home Equipment: Grab bars - tub/shower, Civil engineer, contracting - built in, [place] - single point, Environmental consultant -  2 wheels, Bedside commode, Shower seat, Crutches, Wheelchair - manual   Functional History: Prior Function Level of Independence: Independent Comments: works at [employer]. following most recent admission pt was using RW for mobility and needing supervision for mobility.   Functional Status:  Mobility: Bed Mobility Overal bed mobility: Needs Assistance Bed Mobility: Rolling, Sidelying to Sit Rolling: Supervision Sidelying to sit: HOB elevated, Min assist Supine to sit: Min assist, HOB elevated Sit to supine: Mod assist, HOB elevated Sit to sidelying: Min assist General bed mobility comments: Slowly elevated HOB and allowed pt to rest and adjust. Partial prop onto  L elbow with legs supported by therapist for comfort to allow pt to adjust to beginning ascent to sit up EOB, minA to complete transition with pt pulling on therapist's hand. Transfers Overall transfer level: Needs assistance Equipment used: Rolling walker (2 wheeled) Transfers: Sit to/from Stand Sit to Stand: Min assist, +2 safety/equipment Stand pivot transfers: Mod assist  Lateral/Scoot Transfers: Min guard General transfer comment: MinA to power up to stand, +2 for safety. Cues for hand placement. x1 sit to stand from EOB and x1 from recliner.  Pt is very anxious with mobility requesting a second person to assist Ambulation/Gait Ambulation/Gait assistance: Min assist, +2 safety/equipment Gait Distance (Feet): 8 Feet (x2 bouts of ~5 ft > ~8 ft) Assistive device: Rolling walker (2 wheeled) Gait Pattern/deviations: Step-through pattern, Decreased stride length, Trunk flexed, Shuffle, Decreased dorsiflexion - left General Gait Details: Pt taking small, shuffling steps with poor posture. Cues provided to increase feet clearance, primarily on the L, momentary success. MinAx2 for safety and chair follow, no LOB. Gait velocity: reduced Gait velocity interpretation: <1.31 ft/sec, indicative of household ambulator    ADL: ADL Overall ADL's : Needs assistance/impaired Eating/Feeding: Set up Eating/Feeding Details (indicate cue type and reason): pt with poor appetite Grooming: Wash/dry hands, Wash/dry face, Set up, Bed level Upper Body Bathing: Minimal assistance, Bed level Lower Body Bathing: Maximal assistance, Bed level Upper Body Dressing : Moderate assistance, Bed level Lower Body Dressing: Total assistance, Bed level Lower Body Dressing Details (indicate cue type and reason): pt unable to perform figure 4 Toilet Transfer: Minimal assistance, +2 for safety/equipment, Stand-pivot, BSC, RW Toilet Transfer Details (indicate cue type and reason): unable Toileting- Clothing Manipulation and Hygiene: Maximal assistance, Sit to/from stand Functional mobility during ADLs: Minimal assistance, +2 for safety/equipment, Rolling walker General ADL Comments: Pt severely limited by pain, decreased ability to care for self and decreased safety awareness. Pt sitting at EOB only momentarily before returning to supine due to pain.   Cognition: Cognition Overall Cognitive Status: Impaired/Different from baseline Orientation Level: Oriented X4 Cognition Arousal/Alertness: Awake/alert Behavior During Therapy: WFL for tasks assessed/performed Overall Cognitive Status: Impaired/Different from baseline Area of Impairment: Memory, Following commands, Safety/judgement, Awareness, Attention, Orientation Orientation Level: Disoriented to, Time (difficulty recalling current month) Current Attention Level: Sustained Memory: Decreased recall of precautions, Decreased short-term memory Following Commands: Follows one step commands consistently, Follows one step commands with increased time Safety/Judgement: Decreased awareness of safety, Decreased awareness of deficits Awareness: Emergent General Comments: Pt easily distracted requiring cues to redirect to task he is engaged in.  verbal cues for sequencing and safety    Physical Exam: Blood pressure 121/76, pulse 98, temperature 98.7 F (37.1 C), temperature source Oral, resp. rate 18, weight 97.4 kg, SpO2 98 %. Physical Exam   Lab Results Last 48 Hours        Results for orders placed or performed during the hospital encounter of 01/05/21 (from the past 48 hour(s))  Glucose, capillary     Status: Abnormal    Collection Time: 01/26/21  8:26 PM  Result Value Ref Range    Glucose-Capillary 186 (H) 70 - 99 mg/dL      Comment: Glucose reference range applies only to samples taken after fasting for at least 8 hours.  Glucose, capillary     Status: Abnormal    Collection Time: 01/26/21 11:44 PM  Result Value Ref Range    Glucose-Capillary 191 (H) 70 - 99 mg/dL      Comment: Glucose reference range applies only to  samples taken after fasting for at least 8 hours.  Renal function panel     Status: Abnormal    Collection Time: 01/27/21  3:25 AM  Result Value Ref Range    Sodium 130 (L) 135 - 145 mmol/L    Potassium 3.8 3.5 - 5.1 mmol/L    Chloride 91 (L) 98 - 111 mmol/L    CO2 30 22 - 32 mmol/L    Glucose, Bld 194 (H) 70 - 99 mg/dL      Comment: Glucose reference range applies only to samples taken after fasting for at least 8 hours.    BUN 9 6 - 20 mg/dL    Creatinine, Ser 0.48 (L) 0.61 - 1.24 mg/dL    Calcium 8.3 (L) 8.9 - 10.3 mg/dL    Phosphorus 3.7 2.5 - 4.6 mg/dL    Albumin 2.2 (L) 3.5 - 5.0 g/dL    GFR, Estimated >60 >60 mL/min      Comment: (NOTE) Calculated using the CKD-EPI Creatinine Equation (2021)      Anion gap 9 5 - 15      Comment: Performed at [facility] [address], [place], [place] [address]  Hepatic function panel     Status: Abnormal    Collection Time: 01/27/21  3:25 AM  Result Value Ref Range    Total Protein 5.8 (L) 6.5 - 8.1 g/dL    Albumin 2.2 (L) 3.5 - 5.0 g/dL    AST 11 (L) 15 - 41 U/L    ALT 10 0 - 44 U/L    Alkaline Phosphatase 87 38 - 126 U/L    Total Bilirubin 0.5 0.3 - 1.2 mg/dL    Bilirubin,  Direct 0.1 0.0 - 0.2 mg/dL    Indirect Bilirubin 0.4 0.3 - 0.9 mg/dL      Comment: Performed at [facility] [address], [place], [place] [address]  CBC with Differential/Platelet     Status: Abnormal    Collection Time: 01/27/21  3:25 AM  Result Value Ref Range    WBC 8.0 4.0 - 10.5 K/uL    RBC 2.69 (L) 4.22 - 5.81 MIL/uL    Hemoglobin 7.6 (L) 13.0 - 17.0 g/dL    HCT 23.9 (L) 39.0 - 52.0 %    MCV 88.8 80.0 - 100.0 fL    MCH 28.3 26.0 - 34.0 pg    MCHC 31.8 30.0 - 36.0 g/dL    RDW 16.9 (H) 11.5 - 15.5 %    Platelets 526 (H) 150 - 400 K/uL    nRBC 0.0 0.0 - 0.2 %    Neutrophils Relative % 70 %    Neutro Abs 5.7 1.7 - 7.7 K/uL    Lymphocytes Relative 16 %    Lymphs Abs 1.3 0.7 - 4.0 K/uL    Monocytes Relative 11 %    Monocytes Absolute 0.9 0.1 - 1.0 K/uL    Eosinophils Relative 3 %    Eosinophils Absolute 0.2 0.0 - 0.5 K/uL    Basophils Relative 0 %    Basophils Absolute 0.0 0.0 - 0.1 K/uL    Immature Granulocytes 0 %    Abs Immature Granulocytes 0.02 0.00 - 0.07 K/uL      Comment: Performed at [facility], [place] [address] [place], [place] [address]  Glucose, capillary     Status: Abnormal    Collection Time: 01/27/21  4:44 AM  Result Value Ref Range    Glucose-Capillary 215 (H) 70 -  99 mg/dL      Comment: Glucose reference range applies only to samples taken after fasting for at least 8 hours.  Glucose, capillary     Status: Abnormal    Collection Time: 01/27/21  7:42 AM  Result Value Ref Range    Glucose-Capillary 154 (H) 70 - 99 mg/dL      Comment: Glucose reference range applies only to samples taken after fasting for at least 8 hours.  Glucose, capillary     Status: Abnormal    Collection Time: 01/27/21 11:24 AM  Result Value Ref Range    Glucose-Capillary 185 (H) 70 - 99 mg/dL      Comment: Glucose reference range applies only to samples taken after fasting for at least 8 hours.  Glucose, capillary     Status: Abnormal    Collection Time: 01/27/21   4:01 PM  Result Value Ref Range    Glucose-Capillary 149 (H) 70 - 99 mg/dL      Comment: Glucose reference range applies only to samples taken after fasting for at least 8 hours.  Glucose, capillary     Status: None    Collection Time: 01/27/21  9:12 PM  Result Value Ref Range    Glucose-Capillary 93 70 - 99 mg/dL      Comment: Glucose reference range applies only to samples taken after fasting for at least 8 hours.  Vancomycin, peak     Status: None    Collection Time: 01/27/21 10:22 PM  Result Value Ref Range    Vancomycin Pk 33 30 - 40 ug/mL      Comment: Performed at [facility], [place] [address] [place], [place] [address]  Glucose, capillary     Status: Abnormal    Collection Time: 01/27/21 11:44 PM  Result Value Ref Range    Glucose-Capillary 152 (H) 70 - 99 mg/dL      Comment: Glucose reference range applies only to samples taken after fasting for at least 8 hours.    Comment 1 Notify RN      Comment 2 Document in Chart    Glucose, capillary     Status: Abnormal    Collection Time: 01/28/21  4:01 AM  Result Value Ref Range    Glucose-Capillary 156 (H) 70 - 99 mg/dL      Comment: Glucose reference range applies only to samples taken after fasting for at least 8 hours.    Comment 1 Notify RN      Comment 2 Document in Chart    C-reactive protein     Status: Abnormal    Collection Time: 01/28/21  4:45 AM  Result Value Ref Range    CRP 6.8 (H) <1.0 mg/dL      Comment: Performed at [facility] [address] [place], [place] [address]  Vancomycin, trough     Status: Abnormal    Collection Time: 01/28/21  4:45 AM  Result Value Ref Range    Vancomycin Tr 22 (HH) 15 - 20 ug/mL      Comment: CRITICAL RESULT CALLED TO, READ BACK BY AND VERIFIED WITH:  P. KUFFOUR RN $[id number]  01/28/21 K. SANDERS Performed at [facility], [place] [address], [place], [place] [address]    Glucose, capillary     Status: Abnormal    Collection Time: 01/28/21  8:06 AM  Result Value Ref  Range    Glucose-Capillary 177 (H) 70 - 99 mg/dL      Comment: Glucose reference range  applies only to samples taken after fasting for at least 8 hours.  Glucose, capillary     Status: Abnormal    Collection Time: 01/28/21 12:26 PM  Result Value Ref Range    Glucose-Capillary 265 (H) 70 - 99 mg/dL      Comment: Glucose reference range applies only to samples taken after fasting for at least 8 hours.       Imaging Results (Last 48 hours)  MR THORACIC SPINE WO CONTRAST   Result Date: 01/27/2021 CLINICAL DATA:  Epidural abscess EXAM: MRI THORACIC AND LUMBAR SPINE WITHOUT CONTRAST TECHNIQUE: Multiplanar and multiecho pulse sequences of the thoracic and lumbar spine were obtained without intravenous contrast. COMPARISON:  01/05/2021 FINDINGS: MRI THORACIC SPINE FINDINGS Alignment:  Physiologic. Vertebrae: Hyperintense T2-weighted signal within the T7 and T8 vertebral bodies. There is posterior decompression at the same level. Cord: There is mild hyperintense T2-weighted signal within the central spinal cord at the T6-7 and T8-T10 levels, possibly a prominent central canal. Thin dorsal epidural collection extends from T5-6 through T8-9 Paraspinal and other soft tissues: Large pleural effusions Disc levels: Small disc bulge with endplate spurring at O7-0 causing mild spinal canal stenosis. Moderate bilateral foraminal stenosis MRI LUMBAR SPINE FINDINGS Segmentation:  Standard. Alignment:  Physiologic. Vertebrae:  Slight progression of signal changes at L3-5. Conus medullaris and cauda equina: Conus extends to the L1 level. Ventral epidural collection at L2-3 to L5-S1, measuring 5 mm in thickness. Paraspinal and other soft tissues: There are multiple bilateral psoas collections, the largest of which measures 3.4 cm on the left. Dorsal paraspinal collection less clearly characterized without IV contrast. Disc levels: L1-L2: Normal disc space and facet joints. No spinal canal stenosis. No neural foraminal  stenosis. L2-L3: Normal disc space and facet joints. No spinal canal stenosis. No neural foraminal stenosis. L3-L4: Persistent disc space edema. Status post left laminectomy. Central spinal canal narrowing due to ventral epidural collection has progressed from the prior study. The neural foramina are patent. L4-L5: Persistent disc space edema. Ventral epidural collection with mild narrowing of central spinal canal. Moderate right and severe left neural stenosis unchanged L5-S1: Small disc bulge with ventral epidural collection. No spinal canal stenosis. Severe right and moderate left neural foraminal stenosis. Visualized sacrum: Normal. IMPRESSION: 1. Signal abnormality within the vertebral bodies and disc spaces at L3-4 and L4-5, consistent with discitis-osteomyelitis. Persistent ventral epidural collection at L2-S1 that measures 5 mm in thickness. 2. 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 3. Multiple bilateral psoas abscesses. 4. Bilateral pleural effusions. 5. Moderate-to-severe bilateral neural foraminal stenosis at L4-5 and L5-S1. Electronically Signed   By: Ulyses Jarred M.D.   On: 01/27/2021 21:35   MR LUMBAR SPINE WO CONTRAST   Result Date: 01/27/2021 CLINICAL DATA:  Epidural abscess EXAM: MRI THORACIC AND LUMBAR SPINE WITHOUT CONTRAST TECHNIQUE: Multiplanar and multiecho pulse sequences of the thoracic and lumbar spine were obtained without intravenous contrast. COMPARISON:  01/05/2021 FINDINGS: MRI THORACIC SPINE FINDINGS Alignment:  Physiologic. Vertebrae: Hyperintense T2-weighted signal within the T7 and T8 vertebral bodies. There is posterior decompression at the same level. Cord: There is mild hyperintense T2-weighted signal within the central spinal cord at the T6-7 and T8-T10 levels, possibly a prominent central canal. Thin dorsal epidural collection extends from T5-6 through T8-9 Paraspinal and other soft  tissues: Large pleural effusions Disc levels: Small disc bulge with endplate spurring at J6-2 causing mild spinal canal stenosis. Moderate bilateral foraminal stenosis MRI LUMBAR  SPINE FINDINGS Segmentation:  Standard. Alignment:  Physiologic. Vertebrae:  Slight progression of signal changes at L3-5. Conus medullaris and cauda equina: Conus extends to the L1 level. Ventral epidural collection at L2-3 to L5-S1, measuring 5 mm in thickness. Paraspinal and other soft tissues: There are multiple bilateral psoas collections, the largest of which measures 3.4 cm on the left. Dorsal paraspinal collection less clearly characterized without IV contrast. Disc levels: L1-L2: Normal disc space and facet joints. No spinal canal stenosis. No neural foraminal stenosis. L2-L3: Normal disc space and facet joints. No spinal canal stenosis. No neural foraminal stenosis. L3-L4: Persistent disc space edema. Status post left laminectomy. Central spinal canal narrowing due to ventral epidural collection has progressed from the prior study. The neural foramina are patent. L4-L5: Persistent disc space edema. Ventral epidural collection with mild narrowing of central spinal canal. Moderate right and severe left neural stenosis unchanged L5-S1: Small disc bulge with ventral epidural collection. No spinal canal stenosis. Severe right and moderate left neural foraminal stenosis. Visualized sacrum: Normal. IMPRESSION: 1. Signal abnormality within the vertebral bodies and disc spaces at L3-4 and L4-5, consistent with discitis-osteomyelitis. Persistent ventral epidural collection at L2-S1 that measures 5 mm in thickness. 2. 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 3. Multiple bilateral psoas abscesses. 4. Bilateral pleural effusions. 5. Moderate-to-severe bilateral neural foraminal stenosis at L4-5 and L5-S1. Electronically Signed   By: Ulyses Jarred M.D.   On:  01/27/2021 21:35             Medical Problem List and Plan: 1.  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              -patient may shower             -ELOS/Goals: 12 to 15 days, modified independent goals with PT and OT 2.  Antithrombotics: -DVT/anticoagulation:  Pharmaceutical: Lovenox             -antiplatelet therapy: Baby aspirin daily 3. Pain Management: OxyContin 10 mg every 12 hours with oxycodone 5 mg every 4 hours as needed             -Robaxin 500 mg every 6 hours as needed for spasms             -Voltaren gel for topical relief             -Add K pad for muscle spasm and low back pain             -Tylenol for mild pain 4. Mood: Team to provide ego support             -continue Effexor XR 75 mg daily             -antipsychotic agents: N/A, had been haldol before 5. Neuropsych: This patient is capable of making decisions on his own behalf. -Patient's mental status appears to be improving  6. Skin/Wound Care: Maximize nutritional intake, local skin care as needed 7. Fluids/Electrolytes/Severe Protein malutrition: Patient now is on regular diet.  Encourage p.o. intake.             -Supplementing with core track feeds, reduced rate to stimulate PO             -Monitor nutritional status closely  -Encouraged him to eat so that we can remove his tube. 8.  Sepsis secondary to discitis and epidural abscess/bacteremia             -  IV vancomycin and sertraline until today, then to IV vancomycin alone for 6 weeks             -Monitor clinically and with regular labs 9.  Uncontrolled type 2 diabetes             -Lantus insulin 5 units with sliding scale insulin coverage--increase to 10 units             -Check CBGs before meals and at bedtime 10.  Chronic diastolic congestive heart failure             -Monitor daily weights             -Anasarca most likely due to nutritional status as opposed to cardiac             -Recent 2D  echo demonstrated no wall abnormalities 11.  Hyponatremia             -Likely due to third spacing             -Lasix resumed at low-dose 12. Anemia d/t illness 13. Thrombocytosis             Meredith Staggers, MD 01/28/2021   I have personally performed a face to face diagnostic evaluation of this patient and formulated the key components of the plan.  Additionally, I have personally reviewed laboratory data, imaging studies, as well as relevant notes and concur with the physician assistant's documentation above.  The patient's status has not changed from the original H&P.  Any changes in documentation from the acute care chart have been noted above.  Meredith Staggers, MD, [facility]

## 2021-01-29 NOTE — Plan of Care (Signed)
  Problem: RH Balance Goal: LTG Patient will maintain dynamic sitting balance (PT) Description: LTG:  Patient will maintain dynamic sitting balance with assistance during mobility activities (PT) Flowsheets (Taken 01/29/2021 1448) LTG: Pt will maintain dynamic sitting balance during mobility activities with:: Independent Goal: LTG Patient will maintain dynamic standing balance (PT) Description: LTG:  Patient will maintain dynamic standing balance with assistance during mobility activities (PT) Flowsheets (Taken 01/29/2021 1448) LTG: Pt will maintain dynamic standing balance during mobility activities with:: Independent   Problem: Sit to Stand Goal: LTG:  Patient will perform sit to stand with assistance level (PT) Description: LTG:  Patient will perform sit to stand with assistance level (PT) Flowsheets (Taken 01/29/2021 1448) LTG: PT will perform sit to stand in preparation for functional mobility with assistance level: Independent with assistive device   Problem: RH Bed Mobility Goal: LTG Patient will perform bed mobility with assist (PT) Description: LTG: Patient will perform bed mobility with assistance, with/without cues (PT). Flowsheets (Taken 01/29/2021 1448) LTG: Pt will perform bed mobility with assistance level of: Independent with assistive device    Problem: RH Bed to Chair Transfers Goal: LTG Patient will perform bed/chair transfers w/assist (PT) Description: LTG: Patient will perform bed to chair transfers with assistance (PT). Flowsheets (Taken 01/29/2021 1448) LTG: Pt will perform Bed to Chair Transfers with assistance level: Independent with assistive device    Problem: RH Car Transfers Goal: LTG Patient will perform car transfers with assist (PT) Description: LTG: Patient will perform car transfers with assistance (PT). Flowsheets (Taken 01/29/2021 1448) LTG: Pt will perform car transfers with assist:: Supervision/Verbal cueing   Problem: RH Ambulation Goal: LTG Patient will  ambulate in controlled environment (PT) Description: LTG: Patient will ambulate in a controlled environment, # of feet with assistance (PT). Flowsheets (Taken 01/29/2021 1448) LTG: Pt will ambulate in controlled environ  assist needed:: Supervision/Verbal cueing LTG: Ambulation distance in controlled environment: 100 Goal: LTG Patient will ambulate in home environment (PT) Description: LTG: Patient will ambulate in home environment, # of feet with assistance (PT). Flowsheets (Taken 01/29/2021 1448) LTG: Pt will ambulate in home environ  assist needed:: Supervision/Verbal cueing LTG: Ambulation distance in home environment: 50   Problem: RH Wheelchair Mobility Goal: LTG Patient will propel w/c in controlled environment (PT) Description: LTG: Patient will propel wheelchair in controlled environment, # of feet with assist (PT) Flowsheets (Taken 01/29/2021 1448) LTG: Pt will propel w/c in controlled environ  assist needed:: Independent with assistive device LTG: Propel w/c distance in controlled environment: 350   Problem: RH Stairs Goal: LTG Patient will ambulate up and down stairs w/assist (PT) Description: LTG: Patient will ambulate up and down # of stairs with assistance (PT) Flowsheets (Taken 01/29/2021 1448) LTG: Pt will ambulate up/down stairs assist needed:: Supervision/Verbal cueing LTG: Pt will  ambulate up and down number of stairs: 4

## 2021-01-29 NOTE — Progress Notes (Signed)
Initial Nutrition Assessment  DOCUMENTATION CODES:   Severe malnutrition in context of acute illness/injury  INTERVENTION:   Continue nocturnal feedings via Cortrak: Glucerna 1.5 at 45 ml/h x 12 hours with Prosource TF 45 ml BID. Provides 890 kcal, 67 gm protein per day. Free water flushes 200 ml every 4 hours for a total of 1010 ml per day.  Continue Boost Breeze po TID, each supplement provides 250 kcal and 9 grams of protein  Magic cup TID with meals, each supplement provides 290 kcal and 9 grams of protein   Continue Juven BID, each packet provides 80 calories, 8 grams of carbohydrate, 2.5  grams of protein (collagen), 7 grams of L-arginine and 7 grams of L-glutamine; supplement contains CaHMB, Vitamins C, E, B12 and Zinc to promote wound healing   NUTRITION DIAGNOSIS:   Severe Malnutrition related to acute illness (MRSA bacteremia) as evidenced by mild fat depletion, moderate muscle depletion, energy intake < or equal to 50% for > or equal to 5 days.  GOAL:   Patient will meet greater than or equal to 90% of their needs  MONITOR:   PO intake, Supplement acceptance, Labs, TF tolerance, Skin  REASON FOR ASSESSMENT:   Malnutrition Screening Tool    ASSESSMENT:   55 yo male admitted to rehab 8/5 from [facility] acute care where he was admitted for worsening back pain after laminectomy on 6/28.  Drain was placed for subdural fluid collections. Patient was treated for MRSA bacteremia. TEE negative for vegetation. PMH includes DM, CAD, HTN, HLD, sleep apnea.   During acute care hospitalization, patient was diagnosed with severe malnutrition which is ongoing. He has been receiving TF via Cortrak tube since 7/25; transitioned to nocturnal feedings on 8/2. Currently ordered Glucerna 1.5 at 45 ml/h x 12 hours with Prosource TF 45 ml BID.  This regimen provides 890 kcal, 67 gm protein per day. Free water flushes 200 ml every 4 hours for a total of 1010 ml per day.  Calorie count was  completed 8/5. Patient was consuming on average 50% of his estimated needs.   Labs reviewed. Na 130 (8/4) CBG: [phone]  Medications reviewed and include folic acid, lasix, novolog, semglee, MVI with minerals, Juven, Protonix, Metamucil, IV antibiotics.  Current weight 102.3 kg 8/6 Acute care admission weight 94.3 kg 7/21 Weight increase likely r/t edema.  Per RN assessment, patient has mild pitting edema to BLE.  Patient with new stage 2 PI to coccyx on admission to CIR 8/5.  NUTRITION - FOCUSED PHYSICAL EXAM:  Unable to complete  Diet Order:   Diet Order             Diet regular Room service appropriate? Yes; Fluid consistency: Thin  Diet effective now                   EDUCATION NEEDS:   Education needs have been addressed  Skin:  Skin Integrity Issues:: Stage II Stage II: coccyx  Last BM:  8/5  Height:   Ht Readings from Last 1 Encounters:  01/28/21 5\' 11"  (1.803 m)    Weight:   Wt Readings from Last 1 Encounters:  01/29/21 102.3 kg    BMI:  Body mass index is 31.46 kg/m.  Estimated Nutritional Needs:   Kcal:  2200-2400  Protein:  120-140 gm  Fluid:  >/= 2.2 L    03/31/21, RD, LDN, CNSC Please refer to Amion for contact information.

## 2021-01-30 DIAGNOSIS — E1169 Type 2 diabetes mellitus with other specified complication: Secondary | ICD-10-CM | POA: Diagnosis not present

## 2021-01-30 DIAGNOSIS — R5381 Other malaise: Secondary | ICD-10-CM | POA: Diagnosis not present

## 2021-01-30 DIAGNOSIS — L8990 Pressure ulcer of unspecified site, unspecified stage: Secondary | ICD-10-CM | POA: Insufficient documentation

## 2021-01-30 DIAGNOSIS — I5032 Chronic diastolic (congestive) heart failure: Secondary | ICD-10-CM

## 2021-01-30 DIAGNOSIS — G062 Extradural and subdural abscess, unspecified: Secondary | ICD-10-CM | POA: Diagnosis not present

## 2021-01-30 LAB — GLUCOSE, CAPILLARY
Glucose-Capillary: 139 mg/dL — ABNORMAL HIGH (ref 70–99)
Glucose-Capillary: 146 mg/dL — ABNORMAL HIGH (ref 70–99)
Glucose-Capillary: 153 mg/dL — ABNORMAL HIGH (ref 70–99)
Glucose-Capillary: 158 mg/dL — ABNORMAL HIGH (ref 70–99)
Glucose-Capillary: 172 mg/dL — ABNORMAL HIGH (ref 70–99)
Glucose-Capillary: 181 mg/dL — ABNORMAL HIGH (ref 70–99)
Glucose-Capillary: 229 mg/dL — ABNORMAL HIGH (ref 70–99)

## 2021-01-30 MED ORDER — MEGESTROL ACETATE 400 MG/10ML PO SUSP
400.0000 mg | Freq: Two times a day (BID) | ORAL | Status: DC
  Administered 2021-01-30 – 2021-02-02 (×7): 400 mg via ORAL
  Filled 2021-01-30 (×7): qty 10

## 2021-01-30 NOTE — Progress Notes (Addendum)
PROGRESS NOTE   Subjective/Complaints: Pt with fair night. Sleeping soundly when I arrived. No problems reported yesterday  ROS: Patient denies fever, rash, sore throat, blurred vision, nausea, vomiting, diarrhea, cough, shortness of breath or chest pain, joint or back pain, headache, or mood change.    Objective:   No results found. No results for input(s): WBC, HGB, HCT, PLT in the last 72 hours. No results for input(s): NA, K, CL, CO2, GLUCOSE, BUN, CREATININE, CALCIUM in the last 72 hours.  Intake/Output Summary (Last 24 hours) at 01/30/2021 0901 Last data filed at 01/29/2021 1940 Gross per 24 hour  Intake 960 ml  Output 2150 ml  Net -1190 ml     Pressure Injury 01/28/21 Coccyx Left Stage 2 -  Partial thickness loss of dermis presenting as a shallow open injury with a red, pink wound bed without slough. pink, moist (Active)  01/28/21 1845  Location: Coccyx  Location Orientation: Left  Staging: Stage 2 -  Partial thickness loss of dermis presenting as a shallow open injury with a red, pink wound bed without slough.  Wound Description (Comments): pink, moist  Present on Admission: Yes    Physical Exam: Vital Signs Blood pressure (!) 142/87, pulse 94, temperature 98.2 F (36.8 C), temperature source Oral, resp. rate 18, height 5\' 11"  (1.803 m), weight 102.1 kg, SpO2 99 %.   General: Alert and oriented x 3, No apparent distress HEENT: Head is normocephalic, atraumatic, PERRLA, EOMI, sclera anicteric, oral mucosa pink and moist, dentition intact, ext ear canals clear, NGT Neck: Supple without JVD or lymphadenopathy Heart: Reg rate and rhythm. No murmurs rubs or gallops Chest: CTA bilaterally without wheezes, rales, or rhonchi; no distress Abdomen: Soft, non-tender, non-distended, bowel sounds positive. Extremities: No clubbing, cyanosis, or edema. Pulses are 2+ Psych: Pt's affect is appropriate. Pt is cooperative Skin:   coccyx wound, a few other scattered abrasions.  Neuro:  Alert and oriented x 3. Normal insight and awareness. Intact Memory. Normal language and speech. Cranial nerve exam unremarkable. UE 4/5 prox to distal. LE 2-3/5 prox (pain) to 4/5 distal. No focal sensory abnl. DTR's 1+  Musculoskeletal: back/pelvis tender to palpation and with AROM/PROM    Assessment/Plan: 1. Functional deficits which require 3+ hours per day of interdisciplinary therapy in a comprehensive inpatient rehab setting. Physiatrist is providing close team supervision and 24 hour management of active medical problems listed below. Physiatrist and rehab team continue to assess barriers to discharge/monitor patient progress toward functional and medical goals  Care Tool:  Bathing  Bathing activity did not occur: Refused           Bathing assist       Upper Body Dressing/Undressing Upper body dressing   What is the patient wearing?: Pull over shirt    Upper body assist Assist Level: Maximal Assistance - Patient 25 - 49%    Lower Body Dressing/Undressing Lower body dressing      What is the patient wearing?: Pants, Underwear/pull up     Lower body assist Assist for lower body dressing: Total Assistance - Patient < 25%     Toileting Toileting    Toileting assist Assist for toileting: Set up  assist     Transfers Chair/bed transfer  Transfers assist     Chair/bed transfer assist level: Moderate Assistance - Patient 50 - 74%     Locomotion Ambulation   Ambulation assist      Assist level: 2 helpers Assistive device: Walker-rolling Max distance: 6   Walk 10 feet activity   Assist  Walk 10 feet activity did not occur: Safety/medical concerns        Walk 50 feet activity   Assist Walk 50 feet with 2 turns activity did not occur: Safety/medical concerns  Assist level: Total Assistance - Patient < 25%      Walk 150 feet activity   Assist Walk 150 feet activity did not occur:  Safety/medical concerns         Walk 10 feet on uneven surface  activity   Assist Walk 10 feet on uneven surfaces activity did not occur: Safety/medical concerns         Wheelchair     Assist Will patient use wheelchair at discharge?: No      Wheelchair assist level: Contact Guard/Touching assist Max wheelchair distance: 150    Wheelchair 50 feet with 2 turns activity    Assist        Assist Level: Contact Guard/Touching assist   Wheelchair 150 feet activity     Assist      Assist Level: Contact Guard/Touching assist   Blood pressure (!) 142/87, pulse 94, temperature 98.2 F (36.8 C), temperature source Oral, resp. rate 18, height 5\' 11"  (1.803 m), weight 102.1 kg, SpO2 99 %.  Medical Problem List and Plan: 1.  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              -patient may shower             -ELOS/Goals: 12 to 15 days, modified independent goals with PT and OT  -Continue CIR therapies including PT, OT  2.  Antithrombotics: -DVT/anticoagulation:  Pharmaceutical: Lovenox             -antiplatelet therapy: Baby aspirin daily 3. Pain Management: OxyContin 10 mg every 12 hours with oxycodone 5 mg every 4 hours as needed             -Robaxin 500 mg every 6 hours as needed for spasms             -Voltaren gel for topical relief             -Added K pad for muscle spasm and low back pain             -Tylenol for mild pain 4. Mood: Team to provide ego support             -continue Effexor XR 75 mg daily             -antipsychotic agents: N/A, had been haldol before 5. Neuropsych: This patient is capable of making decisions on his own behalf. -Patient's mental status appears to be improving  6. Skin/Wound Care: Maximize nutritional intake, local skin care as needed 7. Fluids/Electrolytes/Severe Protein malutrition: Patient now is on regular diet.  Encourage p.o. intake.             -Reduced rate  of cortrack feeds to stimulate PO             -only eating sporadically             -add megace 8/7 8.  Sepsis secondary to discitis and epidural abscess/bacteremia             -IV vancomycin and sertraline until today, then to IV vancomycin alone for 6 weeks             -Monitor clinically and with regular labs 9.  Uncontrolled type 2 diabetes             -Lantus insulin 5 units with sliding scale insulin coverage--increased to 10 units 8/6             -improving control  CBG (last 3)  Recent Labs    01/30/21 0110 01/30/21 0435 01/30/21 0755  GLUCAP 139* 172* 153*    10.  Chronic diastolic congestive heart failure             -Monitor daily weights             -Anasarca most likely due to nutritional status as opposed to cardiac             -Recent 2D echo demonstrated no wall abnormalities   Filed Weights   01/28/21 2211 01/29/21 0403 01/30/21 0424  Weight: 102.8 kg 102.3 kg 102.1 kg    11.  Hyponatremia             -Likely due to third spacing             -Lasix resumed at low-dose  -labs Monday 12. Anemia d/t illness 13. Thrombocytosis    LOS: 2 days A FACE TO FACE EVALUATION WAS PERFORMED  Louis [name] 01/30/2021, 9:01 AM

## 2021-01-31 DIAGNOSIS — R5381 Other malaise: Secondary | ICD-10-CM | POA: Diagnosis not present

## 2021-01-31 LAB — GLUCOSE, CAPILLARY
Glucose-Capillary: 157 mg/dL — ABNORMAL HIGH (ref 70–99)
Glucose-Capillary: 170 mg/dL — ABNORMAL HIGH (ref 70–99)
Glucose-Capillary: 206 mg/dL — ABNORMAL HIGH (ref 70–99)
Glucose-Capillary: 207 mg/dL — ABNORMAL HIGH (ref 70–99)
Glucose-Capillary: 222 mg/dL — ABNORMAL HIGH (ref 70–99)
Glucose-Capillary: 241 mg/dL — ABNORMAL HIGH (ref 70–99)

## 2021-01-31 LAB — BASIC METABOLIC PANEL
Anion gap: 7 (ref 5–15)
BUN: 8 mg/dL (ref 6–20)
CO2: 28 mmol/L (ref 22–32)
Calcium: 8.1 mg/dL — ABNORMAL LOW (ref 8.9–10.3)
Chloride: 92 mmol/L — ABNORMAL LOW (ref 98–111)
Creatinine, Ser: 0.44 mg/dL — ABNORMAL LOW (ref 0.61–1.24)
GFR, Estimated: >60 mL/min (ref >60)
Glucose, Bld: 181 mg/dL — ABNORMAL HIGH (ref 70–99)
Potassium: 3.4 mmol/L — ABNORMAL LOW (ref 3.5–5.1)
Sodium: 127 mmol/L — ABNORMAL LOW (ref 135–145)

## 2021-01-31 LAB — C-REACTIVE PROTEIN: CRP: 5.8 mg/dL — ABNORMAL HIGH (ref <1.0)

## 2021-01-31 MED ORDER — FREE WATER
100.0000 mL | Status: DC
  Administered 2021-01-31 – 2021-02-01 (×6): 100 mL

## 2021-01-31 NOTE — Progress Notes (Signed)
PMR Admission Coordinator Pre-Admission Assessment   Patient: Louis [name] is an 55 y.o., male MRN: [id number] DOB: 1966-04-04 Height:   Weight: 97.4 kg   Insurance Information HMO:     PPO: yes     PCP:      IPA:      80/20:      OTHER: PRIMARY: Publishing copy      Policy#: [id number]      Subscriber: pt CM Name: Amy      Phone#: [phone]     Fax#: [phone] Pre-Cert#: [id number] [place] for CIR provided by Amy with updates due to her on 8/15 at fax listed above      Employer: Benefits:  Phone #: [phone]     Name: Eff. Date: 06/29/2018     Deduct: $0      Out of Pocket Max: [phone] (met [phone])      Life Max: n/a CIR: $175/day for 5 days      SNF: no benefits Outpatient:      Co-Pay: $30/visit Home Health: 70%      Co-Pay: 30% DME: 70%     Co-Pay: 30% Providers:  SECONDARY:       Policy#:      Phone#:   Development worker, community:       Phone#:   The Therapist, art Information Summary" for patients in Inpatient Rehabilitation Facilities with attached "Privacy Act [place] Records" was provided and verbally reviewed with: N/A   Emergency Contact Information Contact Information       Name Relation Home Work Mobile    Hacking,karen Spouse     [phone]           Current Medical History  Patient Admitting Diagnosis: debility    History of Present Illness: Pt is a 55 y/o male with PMH of DM, HTN, CAD with CABG in 2017, anxiety, and chronic pain.  Presented to [facility] on 7/13 with 5 days of worsening back pain with severe constant bilateral radiation to LEs.  Had previously been at [facility] 6/19 through 7/3 for L3-S1 discitis s/p laminectomy on 6/28 and L psoas abscess on 6/29.  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  TTE at that time was negative.  Following readmit in July, his lumbar MRI showed multiple fluid collections at L4-L5.  Neurosurgery recommended no surgical intervention due to lack of neurological compromise.  TEE negative for  vegetation.  IR drained 48m of L3 level fluid.  WBC improving on vancomycin and cefazoline, which were started on 7/21.  Blood cultures have been repeated and have showed no growth since 7/22.  Serial MRIs showed decrease in abscess size and pt is starting to consume enough calories for consideration of d/c Cortrak.  Therapy evaluations were completed and pt was recommended for CIR.     [facility] the [facility] admission coordinator and physician.   Past Medical History      Past Medical History:  Diagnosis Date   CAD (coronary artery disease)     DM (diabetes mellitus) [place]     Hyperlipidemia     Hypertension        Family History   family history is not on file.   Prior Rehab/Hospitalizations Has the patient had prior rehab or hospitalizations prior to admission? Yes   Has the patient had major surgery during 100 days prior to admission? Yes  Current Medications   Current Facility-Administered Medications:   (feeding supplement) PROSource Plus liquid 30 mL, 30 mL, Oral, TID BM, Samtani, Jai-Gurmukh, MD, 30 mL at 01/28/21 1114   acetaminophen (TYLENOL) tablet 650 mg, 650 mg, Oral, Q4H PRN, 650 mg at 01/27/21 2133 **OR** [DISCONTINUED] acetaminophen (TYLENOL) suppository 650 mg, 650 mg, Rectal, Q6H PRN, Stanford Breed, Denice Bors, MD   amLODipine (NORVASC) tablet 5 mg, 5 mg, Oral, Daily, Charlynne Cousins, MD, 5 mg at 01/28/21 1119   aspirin EC tablet 81 mg, 81 mg, Oral, Daily, Lelon Perla, MD, 81 mg at 01/28/21 1119   atorvastatin (LIPITOR) tablet 40 mg, 40 mg, Oral, QHS, Lelon Perla, MD, 40 mg at 01/27/21 2134   diclofenac Sodium (VOLTAREN) 1 % topical gel 1 application, 1 application, Topical, QID PRN, Lelon Perla, MD, 1 application at 32/35/57 1102   diclofenac Sodium (VOLTAREN) 1 % topical gel 2 g, 2 g, Topical, QID, Carlyle Basques, MD, 2 g at 01/28/21 1250   enoxaparin (LOVENOX) injection 40 mg, 40  mg, Subcutaneous, QHS, Crenshaw, Denice Bors, MD, 40 mg at 01/27/21 2133   feeding supplement (BOOST / RESOURCE BREEZE) liquid 1 Container, 1 Container, Oral, TID BM, Nita Sells, MD, 1 Container at 01/27/21 2134   feeding supplement (GLUCERNA 1.5 CAL) liquid 1,000 mL, 1,000 mL, Per Tube, Q24H, Charlynne Cousins, MD, 1,000 mL at 01/27/21 1810   feeding supplement (PROSource TF) liquid 45 mL, 45 mL, Per Tube, BID, Charlynne Cousins, MD, 45 mL at 32/20/25 4270   folic acid (FOLVITE) tablet 1 mg, 1 mg, Oral, Daily, Aileen Fass, Tammi Klippel, MD, 1 mg at 01/28/21 1119   free water 200 mL, 200 mL, Per Tube, Q4H, Charlynne Cousins, MD, 200 mL at 01/28/21 1235   furosemide (LASIX) injection 20 mg, 20 mg, Intravenous, Daily, Samtani, Jai-Gurmukh, MD, 20 mg at 01/28/21 1130   haloperidol lactate (HALDOL) injection 2 mg, 2 mg, Intravenous, Q6H PRN, Charlynne Cousins, MD, 2 mg at 01/24/21 1943   insulin aspart (novoLOG) injection 0-15 Units, 0-15 Units, Subcutaneous, Q4H, Charlynne Cousins, MD, 8 Units at 01/28/21 1247   insulin aspart (novoLOG) injection 4 Units, 4 Units, Subcutaneous, TID WC, Charlynne Cousins, MD, 4 Units at 01/28/21 1247   insulin glargine (LANTUS) injection 5 Units, 5 Units, Subcutaneous, Daily, Aileen Fass, Tammi Klippel, MD, 5 Units at 01/28/21 1113   labetalol (NORMODYNE) injection 10 mg, 10 mg, Intravenous, Q4H PRN, Dwyane Dee, MD   losartan (COZAAR) tablet 25 mg, 25 mg, Oral, q morning, Girguis, David, MD, 25 mg at 01/28/21 1126   MEDLINE mouth rinse, 15 mL, Mouth Rinse, BID, Dwyane Dee, MD, 15 mL at 01/28/21 1121   melatonin tablet 3 mg, 3 mg, Oral, QHS, Charlynne Cousins, MD, 3 mg at 01/27/21 2133   metoprolol succinate (TOPROL-XL) 24 hr tablet 25 mg, 25 mg, Oral, Daily, Lelon Perla, MD, 25 mg at 01/28/21 1120   multivitamin with minerals tablet 1 tablet, 1 tablet, Oral, Daily, Dwyane Dee, MD, 1 tablet at 01/28/21 1119   ondansetron (ZOFRAN) tablet  4 mg, 4 mg, Oral, Q6H PRN **OR** ondansetron (ZOFRAN) injection 4 mg, 4 mg, Intravenous, Q6H PRN, Lelon Perla, MD, 4 mg at 01/26/21 1449   oxyCODONE (Oxy IR/ROXICODONE) immediate release tablet 5 mg, 5 mg, Oral, Q4H PRN, Dwyane Dee, MD, 5 mg at 01/28/21 1247   oxyCODONE (OXYCONTIN) 12 hr tablet 10 mg, 10 mg, Oral, Q12H, Dwyane Dee, MD, 10 mg at 01/28/21 1119  packet, Oral, Daily, Feliz Ortiz, Abraham, MD, 1 packet at 01/28/21 1116   vancomycin (VANCOREADY) IVPB 1500 mg/300 mL, 1,500 mg, Intravenous, Q12H, Sinclair, Emily S, RPH, Last Rate: 150 mL/hr at 01/28/21 0832, 1,500 mg at 01/28/21 0832   venlafaxine XR (EFFEXOR-XR) 24 hr capsule 75 mg, 75 mg, Oral, q morning, Crenshaw, Brian S, MD, 75 mg at 01/28/21 1126  Patients Current Diet:  Diet Order             Diet regular Room service appropriate? Yes; Fluid consistency: Thin  Diet effective now                   Precautions / Restrictions Precautions Precautions: Back Precaution Booklet Issued: No Precaution Comments: monitor BP; provided cues to maintain during mobility Restrictions Weight Bearing Restrictions: No   Has the patient had 2 or more falls or a fall with injury in the past year? No  Prior Activity Level Community (5-7x/wk): prior to June admission was independent and working at [employer]; immediately prior to July admission was supervision with RW at home  Prior Functional Level Self Care: Did the patient need help bathing, dressing, using the toilet or eating? Independent  Indoor Mobility: Did the patient need assistance with walking from room to room (with or without device)? Independent  Stairs: Did the patient need assistance with internal or external stairs (with or without device)? Independent  Functional Cognition: Did the patient need help planning regular tasks such as  shopping or remembering to take medications? Independent  Home Assistive Devices / Equipment Home Assistive Devices/Equipment: None Home Equipment: Grab bars - tub/shower, Shower seat - built in, Cane - single point, Walker - 2 wheels, Bedside commode, Shower seat, Crutches, Wheelchair - manual  Prior Device Use: Indicate devices/aids used by the patient prior to current illness, exacerbation or injury?  RW since June admit, prior to June, no DME  Current Functional Level Cognition  Overall Cognitive Status: Impaired/Different from baseline Current Attention Level: Sustained Orientation Level: Oriented X4 Following Commands: Follows one step commands consistently, Follows one step commands with increased time Safety/Judgement: Decreased awareness of safety, Decreased awareness of deficits General Comments: Pt easily distracted requiring cues to redirect to task he is engaged in.  verbal cues for sequencing and safety    Extremity Assessment (includes Sensation/Coordination)  Upper Extremity Assessment: Generalized weakness  Lower Extremity Assessment: Defer to PT evaluation LLE Deficits / Details: L hip/soas in severe pain    ADLs  Overall ADL's : Needs assistance/impaired Eating/Feeding: Set up Eating/Feeding Details (indicate cue type and reason): pt with poor appetite Grooming: Wash/dry hands, Wash/dry face, Set up, Bed level Upper Body Bathing: Minimal assistance, Bed level Lower Body Bathing: Maximal assistance, Bed level Upper Body Dressing : Moderate assistance, Bed level Lower Body Dressing: Total assistance, Bed level Lower Body Dressing Details (indicate cue type and reason): pt unable to perform figure 4 Toilet Transfer: Minimal assistance, +2 for safety/equipment, Stand-pivot, BSC, RW Toilet Transfer Details (indicate cue type and reason): unable Toileting- Clothing Manipulation and Hygiene: Maximal assistance, Sit to/from stand Functional mobility during ADLs: Minimal  assistance, +2 for safety/equipment, Rolling walker General ADL Comments: Pt severely limited by pain, decreased ability to care for self and decreased safety awareness. Pt sitting at EOB only momentarily before returning to supine due to pain.    Mobility  Overal bed mobility: Needs Assistance Bed Mobility: Rolling, Sidelying to Sit Rolling: Supervision Sidelying to sit: HOB elevated, Min assist Supine to sit: Min   assist, HOB elevated Sit to supine: Mod assist, HOB elevated Sit to sidelying: Min assist General bed mobility comments: Slowly elevated HOB and allowed pt to rest and adjust. Partial prop onto L elbow with legs supported by therapist for comfort to allow pt to adjust to beginning ascent to sit up EOB, minA to complete transition with pt pulling on therapist's hand.    Transfers  Overall transfer level: Needs assistance Equipment used: Rolling walker (2 wheeled) Transfers: Sit to/from Stand Sit to Stand: Min assist, +2 safety/equipment Stand pivot transfers: Mod assist  Lateral/Scoot Transfers: Min guard General transfer comment: MinA to power up to stand, +2 for safety. Cues for hand placement. x1 sit to stand from EOB and x1 from recliner.  Pt is very anxious with mobility requesting a second person to assist    Ambulation / Gait / Stairs / Wheelchair Mobility  Ambulation/Gait Ambulation/Gait assistance: Min assist, +2 safety/equipment Gait Distance (Feet): 8 Feet (x2 bouts of ~5 ft > ~8 ft) Assistive device: Rolling walker (2 wheeled) Gait Pattern/deviations: Step-through pattern, Decreased stride length, Trunk flexed, Shuffle, Decreased dorsiflexion - left General Gait Details: Pt taking small, shuffling steps with poor posture. Cues provided to increase feet clearance, primarily on the L, momentary success. MinAx2 for safety and chair follow, no LOB. Gait velocity: reduced Gait velocity interpretation: <1.31 ft/sec, indicative of household ambulator    Posture /  Balance Dynamic Sitting Balance Sitting balance - Comments: Prefers UE support to reduce pain. Balance Overall balance assessment: Needs assistance Sitting-balance support: Bilateral upper extremity supported, Feet supported, Single extremity supported Sitting balance-Leahy Scale: Poor Sitting balance - Comments: Prefers UE support to reduce pain. Standing balance support: Bilateral upper extremity supported Standing balance-Leahy Scale: Poor Standing balance comment: Reliant on UE support and external assistance for safe mobility.    Special needs/care consideration Continuous Drip IV  Vancomycin 150mL/hr q12, Oxygen 2L, and Diabetic management yes   Previous Home Environment (from acute therapy documentation) Living Arrangements: Spouse/significant other Available Help at Discharge: Family, Available 24 hours/day Type of Home: House Home Layout: One level Home Access: Stairs to enter Entrance Stairs-Rails: Can reach both Entrance Stairs-Number of Steps: 4 Bathroom Shower/Tub: Walk-in shower Bathroom Toilet: Standard Home Care Services: No  Discharge Living Setting Plans for Discharge Living Setting: Lives with (comment) (spouse, Karen) Type of Home at Discharge: House Discharge Home Layout: One level Discharge Home Access: Stairs to enter Entrance Stairs-Rails: Can reach both Entrance Stairs-Number of Steps: 4 Discharge Bathroom Shower/Tub: Walk-in shower Discharge Bathroom Toilet: Standard Discharge Bathroom Accessibility: Yes How Accessible: Accessible via walker Does the patient have any problems obtaining your medications?: No  Social/Family/Support Systems Patient Roles: Spouse Anticipated Caregiver: Karen Albino Anticipated Caregiver's Contact Information: [phone] Ability/Limitations of Caregiver: supervision to min assist Caregiver Availability: 24/7 Discharge Plan Discussed with Primary Caregiver: Yes Is Caregiver In Agreement with Plan?: Yes Does  Caregiver/Family have Issues with Lodging/Transportation while Pt is in Rehab?: No  Goals Patient/Family Goal for Rehab: PT/OT supervision to mod I, SLP n/a Expected length of stay: 12-15 days Pt/Family Agrees to Admission and willing to participate: Yes Program Orientation Provided & Reviewed with Pt/Caregiver Including Roles  & Responsibilities: Yes  Decrease burden of Care through IP rehab admission: n/a  Possible need for SNF placement upon discharge: Not anticipated  Patient Condition: I have reviewed medical records from [facility], spoken with CM, and patient and spouse. I met with patient at the bedside for inpatient rehabilitation assessment.  Patient will benefit from ongoing   with Pt/Caregiver Including Roles  & Responsibilities: Yes   Decrease burden of Care through IP rehab admission: n/a   Possible need for SNF placement upon discharge: Not anticipated   Patient Condition: I have reviewed medical records from [facility], spoken with CM, and patient and spouse. I met with patient at the bedside for inpatient rehabilitation assessment.  Patient will benefit from ongoing PT and OT, can actively participate in 3 hours of therapy a day 5 days of the week, and can make measurable gains during the admission.  Patient will also benefit from the coordinated team approach during an Inpatient Acute Rehabilitation admission.  The patient will receive intensive therapy as well as Rehabilitation physician, nursing, social worker, and care management interventions.  Due to safety, disease management, medication administration, pain management, and patient education the patient requires 24 hour a day rehabilitation nursing.  The patient is currently mod +2 with mobility and basic ADLs.  Discharge setting and therapy post discharge at home with home health is anticipated.  Patient has agreed to participate in the Acute Inpatient Rehabilitation Program and will admit today.   Preadmission Screen Completed By:  Michel Santee, PT, DPT 01/28/2021 4:26 PM ______________________________________________________________________   Discussed status with Dr. Naaman Plummer on 01/28/21  at 4:35 PM  and received approval for admission today.   Admission Coordinator:  Michel Santee, PT, DPT time 4:53 PM Sudie Grumbling 01/28/21     Assessment/Plan: Diagnosis:  debility related to lumbar discitis Does the need for close, 24 hr/day Medical supervision in concert with the patient's rehab needs make it unreasonable for this patient to be served in a less intensive setting? Yes Co-Morbidities requiring supervision/potential complications: DM, HTN, CAD, Due to bladder management, bowel management, safety, skin/wound care, disease management, medication administration, pain management, and patient education, does the patient require 24 hr/day rehab nursing? Yes Does the patient require coordinated care of a physician, rehab nurse, PT, OT, and SLP to address physical and functional deficits in the context of the above medical diagnosis(es)? Yes Addressing deficits in the following areas: balance, endurance, locomotion, strength, transferring, bowel/bladder control, bathing, dressing, feeding, grooming, toileting, and psychosocial support Can the patient actively participate in an intensive therapy program of at least 3 hrs of therapy 5 days a week? Yes The potential for patient to make measurable gains while on inpatient rehab is excellent Anticipated functional outcomes upon discharge from inpatient rehab: modified independent PT, modified independent OT, n/a SLP Estimated rehab length of stay to reach the above functional goals is: 12=15 days Anticipated discharge destination: Home 10. Overall Rehab/Functional Prognosis: excellent     MD Signature: Meredith Staggers, MD, [facility] 01/28/2021

## 2021-01-31 NOTE — Progress Notes (Signed)
PROGRESS NOTE   Subjective/Complaints:  Reviewed labs , discussed ABX with wife, will be on IV abx upon d/c  Pt asking about going on vacation this weekend , reoriented to need fo rhospitalization  Discussed diuresis and elytes   ROS: Patient denies CP, SOB, N/V/D  Objective:   No results found. No results for input(s): WBC, HGB, HCT, PLT in the last 72 hours. Recent Labs    01/31/21 0603  NA 127*  K 3.4*  CL 92*  CO2 28  GLUCOSE 181*  BUN 8  CREATININE 0.44*  CALCIUM 8.1*    Intake/Output Summary (Last 24 hours) at 01/31/2021 0950 Last data filed at 01/31/2021 6144 Gross per 24 hour  Intake 721.17 ml  Output 3125 ml  Net -2403.83 ml      Pressure Injury 01/28/21 Coccyx Left Stage 2 -  Partial thickness loss of dermis presenting as a shallow open injury with a red, pink wound bed without slough. pink, moist (Active)  01/28/21 1845  Location: Coccyx  Location Orientation: Left  Staging: Stage 2 -  Partial thickness loss of dermis presenting as a shallow open injury with a red, pink wound bed without slough.  Wound Description (Comments): pink, moist  Present on Admission: Yes    Physical Exam: Vital Signs Blood pressure 114/74, pulse 93, temperature 97.9 F (36.6 C), resp. rate 18, height 5\' 11"  (1.803 m), weight 98.3 kg, SpO2 96 %.    General: No acute distress Mood and affect are appropriate Heart: Regular rate and rhythm no rubs murmurs or extra sounds Lungs: Clear to auscultation, breathing unlabored, no rales or wheezes Abdomen: Positive bowel sounds, soft nontender to palpation, nondistended Extremities: No clubbing, cyanosis, or edema Skin: No evidence of breakdown, no evidence of rash   Neuro:  Alert and oriented x 3. Normal insight and awareness. Intact Memory. Normal language and speech. Cranial nerve exam unremarkable. UE 4/5 prox to distal. BLE 3/5 prox  to 4/5 distal. No focal sensory abnl.   Musculoskeletal: back/pelvis tender to palpation and with AROM/PROM    Assessment/Plan: 1. Functional deficits which require 3+ hours per day of interdisciplinary therapy in a comprehensive inpatient rehab setting. Physiatrist is providing close team supervision and 24 hour management of active medical problems listed below. Physiatrist and rehab team continue to assess barriers to discharge/monitor patient progress toward functional and medical goals  Care Tool:  Bathing  Bathing activity did not occur: Refused           Bathing assist       Upper Body Dressing/Undressing Upper body dressing   What is the patient wearing?: Pull over shirt    Upper body assist Assist Level: Maximal Assistance - Patient 25 - 49%    Lower Body Dressing/Undressing Lower body dressing      What is the patient wearing?: Pants, Underwear/pull up     Lower body assist Assist for lower body dressing: Total Assistance - Patient < 25%     Toileting Toileting    Toileting assist Assist for toileting: Independent with assistive device Assistive Device Comment: Urinal   Transfers Chair/bed transfer  Transfers assist     Chair/bed transfer assist level:  Moderate Assistance - Patient 50 - 74%     Locomotion Ambulation   Ambulation assist      Assist level: 2 helpers Assistive device: Walker-rolling Max distance: 6   Walk 10 feet activity   Assist  Walk 10 feet activity did not occur: Safety/medical concerns        Walk 50 feet activity   Assist Walk 50 feet with 2 turns activity did not occur: Safety/medical concerns  Assist level: Total Assistance - Patient < 25%      Walk 150 feet activity   Assist Walk 150 feet activity did not occur: Safety/medical concerns         Walk 10 feet on uneven surface  activity   Assist Walk 10 feet on uneven surfaces activity did not occur: Safety/medical concerns         Wheelchair     Assist Will patient use  wheelchair at discharge?: No      Wheelchair assist level: Contact Guard/Touching assist Max wheelchair distance: 150    Wheelchair 50 feet with 2 turns activity    Assist        Assist Level: Contact Guard/Touching assist   Wheelchair 150 feet activity     Assist      Assist Level: Contact Guard/Touching assist   Blood pressure 114/74, pulse 93, temperature 97.9 F (36.6 C), resp. rate 18, height 5\' 11"  (1.803 m), weight 98.3 kg, SpO2 96 %.  Medical Problem List and Plan: 1.  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              -patient may shower             -ELOS/Goals: 12 to 15 days, modified independent goals with PT and OT  -Continue CIR therapies including PT, OT  2.  Antithrombotics: -DVT/anticoagulation:  Pharmaceutical: Lovenox- don't anticipate need at discharge              -antiplatelet therapy: Baby aspirin daily 3. Pain Management: OxyContin 10 mg every 12 hours with oxycodone 5 mg every 4 hours as needed             -Robaxin 500 mg every 6 hours as needed for spasms             -Voltaren gel for topical relief             -Added K pad for muscle spasm and low back pain             -Tylenol for mild pain 4. Mood: Team to provide ego support             -continue Effexor XR 75 mg daily             -antipsychotic agents: N/A, had been haldol before 5. Neuropsych: This patient is capable of making decisions on his own behalf. -Patient's mental status appears to be improving  6. Skin/Wound Care: Maximize nutritional intake, local skin care as needed 7. Fluids/Electrolytes/Severe Protein malutrition: Patient now is on regular diet.  Encourage p.o. intake.             -Reduced rate of cortrack feeds to stimulate PO             -only eating sporadically             -add megace 8/7 8.  Sepsis secondary to discitis and epidural abscess/bacteremia             -  IV vancomycin and sertraline until today,  then to IV vancomycin alone for 6 weeks             -Monitor clinically and with regular labs 9.  Uncontrolled type 2 diabetes             -Lantus insulin 5 units with sliding scale insulin coverage--increased to 10 units 8/6             -improving control  CBG (last 3)  Recent Labs    01/30/21 2352 01/31/21 0355 01/31/21 0828  GLUCAP 158* 207* 170*    Fair control monitor on current dose  10.  Chronic diastolic congestive heart failure             -Monitor daily weights             -Anasarca most likely due to nutritional status as opposed to cardiac             -Recent 2D echo demonstrated no wall abnormalities   Filed Weights   01/29/21 0403 01/30/21 0424 01/31/21 0400  Weight: 102.3 kg 102.1 kg 98.3 kg   Down 4 kg, which is  consistent since he is down 4L since admit 11.  Hyponatremia- reduce Free H20 to              -Hypokalemia, likely due to lasix will supplement K, x 1 d , should improve off lasix recheck on Wed  12. Anemia d/t illness 13. Thrombocytosis    LOS: 3 days A FACE TO FACE EVALUATION WAS PERFORMED  Erick Colace 01/31/2021, 9:50 AM

## 2021-01-31 NOTE — Progress Notes (Signed)
Inpatient Rehabilitation Care Coordinator Assessment and Plan Patient Details  Name: Louis [name] MRN: [id number] Date of Birth: 07/08/1965  Today's Date: 01/31/2021  Hospital Problems: Principal Problem:   Debility Active Problems:   Epidural abscess   Pressure injury of skin  Past Medical History:  Past Medical History:  Diagnosis Date   CAD (coronary artery disease)    DM (diabetes mellitus) ([place])    Hyperlipidemia    Hypertension    Past Surgical History:  Past Surgical History:  Procedure Laterality Date   LUMBAR LAMINECTOMY FOR EPIDURAL ABSCESS N/A 12/21/2020   Procedure: Thoracic seven-eight, Thoracic twelve-Lumbar one, Lumbar three-four laminectomy for Epidural Abscess;  Surgeon: Karsten Ro, DO;  Location: [facility];  Service: Neurosurgery;  Laterality: N/A;   TEE WITHOUT CARDIOVERSION N/A 01/11/2021   Procedure: TRANSESOPHAGEAL ECHOCARDIOGRAM (TEE);  Surgeon: Lelon Perla, MD;  Location: [facility];  Service: Cardiovascular;  Laterality: N/A;   Social History:  reports that he has been smoking cigarettes. He started smoking about 25 years ago. He has been smoking an average of 2 packs per day. He has never used smokeless tobacco. He reports previous alcohol use. He reports that he does not use drugs.  Family / Support Systems Marital Status: Married Patient Roles: Spouse Spouse/Significant Other: Jeri Cos Anticipated Caregiver: Gervase Colberg Ability/Limitations of Caregiver: Supervision to [employer] A Caregiver Availability: 24/7  Social History Preferred language: English Religion:  Read: Yes Write: Yes Employment Status: Employed Name of Employer: Marine scientist   Abuse/Neglect Abuse/Neglect Assessment Can Be Completed: Yes Physical Abuse: Denies Verbal Abuse: Denies Sexual Abuse: Denies Exploitation of patient/patient's resources: Denies Self-Neglect: Denies Possible abuse reported to:: [place] of social services  Emotional Status Recent  Psychosocial Issues: coping Psychiatric History: n/a Substance Abuse History: n/a  Patient / Family Perceptions, Expectations & Goals Pt/Family understanding of illness & functional limitations: yes Premorbid pt/family roles/activities: Patient was independent and working before June. Patient at supervision level with RW in July Anticipated changes in roles/activities/participation: spouse able to assist roles and task Pt/family expectations/goals: Supervision to [place]: None Premorbid Home Care/DME Agencies: Other (Comment) ([employer] [employer], Civil engineer, contracting, [employer] [place], Conservation officer, nature, Engineer, civil (consulting), Health visitor, Radio broadcast assistant) Transportation available at discharge: Spouse able to [employer] referrals recommended: Neuropsychology  Discharge Planning Living Arrangements: Spouse/significant other Support Systems: Spouse/significant other Type of Residence: Private residence (1 level home, 4 steps) Administrator, sports: Multimedia programmer (specify) Geneticist, molecular) Financial Resources: Employment Museum/gallery curator Screen Referred: No Living Expenses: Own Money Management: Patient, Spouse Does the patient have any problems obtaining your medications?: No Home Management: Independent Patient/Family Preliminary Plans: Spouse able to assist Care Coordinator Barriers to Discharge: IV antibiotics, Insurance for SNF coverage Care Coordinator Anticipated Follow Up Needs: HH/OP DC Planning Additional Notes/Comments: D/c W/ PIC Expected length of stay: 12-15 Days  Clinical Impression SW met with patient and spouse in the room. Introduced self, explained role and addressed questions and concerns. Patient plans to discharge home with spouse to 1 level home. 4 steps to enter. Patient and spouse had no additional questions or concerns. SW will continue to follow up.   Dyanne Iha 01/31/2021, 12:39 PM

## 2021-01-31 NOTE — Progress Notes (Signed)
Inpatient Rehabilitation Center Individual Statement of Services  Patient Name:  Louis [name]  Date:  01/31/2021  Welcome to the Inpatient Rehabilitation Center.  Our goal is to provide you with an individualized program based on your diagnosis and situation, designed to meet your specific needs.  With this comprehensive rehabilitation program, you will be expected to participate in at least 3 hours of rehabilitation therapies Monday-Friday, with modified therapy programming on the weekends.  Your rehabilitation program will include the following services:  Physical Therapy (PT), Occupational Therapy (OT), Speech Therapy (ST), 24 hour per day rehabilitation nursing, Therapeutic Recreaction (TR), Neuropsychology, Care Coordinator, Rehabilitation Medicine, Nutrition Services, Pharmacy Services, and Other  Weekly team conferences will be held on Wednesdays to discuss your progress.  Your Inpatient Rehabilitation Care Coordinator will talk with you frequently to get your input and to update you on team discussions.  Team conferences with you and your family in attendance may also be held.  Expected length of stay:  12-15 Days  Overall anticipated outcome:  Supervision to MOD I   Depending on your progress and recovery, your program may change. Your Inpatient Rehabilitation Care Coordinator will coordinate services and will keep you informed of any changes. Your Inpatient Rehabilitation Care Coordinator's name and contact numbers are listed  below.  The following services may also be recommended but are not provided by the Inpatient Rehabilitation Center:   Home Health Rehabiltiation Services Outpatient Rehabilitation Services    Arrangements will be made to provide these services after discharge if needed.  Arrangements include referral to agencies that provide these services.  Your insurance has been verified to be:  BCBS  Your primary doctor is:  Bobette Mo, NP  Pertinent information will  be shared with your doctor and your insurance company.  Inpatient Rehabilitation Care Coordinator:  Lavera Guise, [place] [phone] or [phone]  Information discussed with and copy given to patient by: Andria Rhein, 01/31/2021, 10:47 AM

## 2021-01-31 NOTE — Progress Notes (Signed)
Occupational Therapy Session Note  Patient Details  Name: Louis [name] MRN: [id number] Date of Birth: 30-Jul-1965  Today's Date: 01/31/2021 OT Individual Time: 1350-1446 OT Individual Time Calculation (min): 56 min    Short Term Goals: Week 1:  OT Short Term Goal 1 (Week 1): Patient will complete bed mobility and functional transfers with CGA OT Short Term Goal 2 (Week 1): patient will complete bathing and toileting with min A OT Short Term Goal 3 (Week 1): patient will complete dressing tasks with min A using assistive devices with good carryover OT Short Term Goal 4 (Week 1): patient will tolerate standing activities and basic HM for 5-10 minutes with CGA  Skilled Therapeutic Interventions/Progress Updates:    Pt in bed to start session with spouse present.  BP taken in supine at 126/78 and HOB elevated approximately 25 degrees.  Attempted transition to sitting from flat position, but pt with increased pain and dizziness when attempted with eyes rolling back into his head and then him closing them after only 1/2 transition from sidelying to sit.  Elevated HOB above 45 degrees and then completed process of transitioning to sitting with overall mod assist.  No significant increase in pain and no dizziness reported.  BP in sitting checked at 122/74.  He was able to complete sit to stand with min assist off of the elevated bed with min instructional cueing for hand placement.  He tolerated standing for 3 mins with BP taken again at 121/75.  Next, he completed functional mobility approximately 36' over to the door of the room with use of the RW and mod assist.  Increased dependency on BUE support on the walker, but again no report of dizziness, just stating that he felt very weak.  Finished session with ambulation back to the bed at the same mod assist level.  Mod facilitation for transition to supine secondary to increased back pain with pt reporting some dizziness with transition.  Will continue to monitor  for further dizziness related to BPPV vs hypotension/pain.  Call button and phone in reach at end of session.    Therapy Documentation Precautions:  Precautions Precautions: Back Precaution Booklet Issued: No Precaution Comments: Pt unable to recall 3/3 back precautions Restrictions Weight Bearing Restrictions: No   Pain: Pain Assessment Pain Scale: 0-10 Pain Score: 7  Pain Type: Surgical pain Pain Location: Back Pain Orientation: Lower Pain Descriptors / Indicators: Discomfort Pain Frequency: Constant Pain Onset: With Activity Pain Intervention(s): Repositioned ADL: See Care Tool Section for some details of mobility and selfcare  Therapy/Group: Individual Therapy  Marguerita Stapp OTR/L 01/31/2021, 4:16 PM

## 2021-01-31 NOTE — Progress Notes (Signed)
Patient ID: Louis [name], male   DOB: [birth year], 55 y.o.   MRN: [id number] Met with the patient and family member to introduce self and the role of the nurse CM. Reviewed medications and co-morbidities including DM (A1C9.1), HTN, HLD and low albumin level. Discussed IV abx through 03/11/21 per MD and stage 2 to coccyx area. Reviewed nutritional needs and wound healing. Patient noted he is eating and really wants cortrak removed. Patient was not taking insulin PTA ; only oral agents and will need review of administration prior to discharge; currently on lantus/semglee and meal coverage.Continue to follow along to discharge to address educational needs and collaborate with the SW to facilitate preparation for discharge. Margarito Liner, RN

## 2021-01-31 NOTE — Progress Notes (Signed)
Occupational Therapy Session Note  Patient Details  Name: Louis [name] MRN: [id number] Date of Birth: 06/25/1966  Today's Date: 01/31/2021 OT Individual Time: 1030-1130 OT Individual Time Calculation (min): 60 min    Short Term Goals: Week 1:  OT Short Term Goal 1 (Week 1): Patient will complete bed mobility and functional transfers with CGA OT Short Term Goal 2 (Week 1): patient will complete bathing and toileting with min A OT Short Term Goal 3 (Week 1): patient will complete dressing tasks with min A using assistive devices with good carryover OT Short Term Goal 4 (Week 1): patient will tolerate standing activities and basic HM for 5-10 minutes with CGA   Skilled Therapeutic Interventions/Progress Updates:    Pt semi upright in bed with nursing for education.  Pt reports a 6/10 pain at rest in BLE and low back and a 10/10 with bed mobility in low back.  Nurse made aware and pain medication requested per pt.  Pt unable to recall 3/3 spinal precautions initially however when provided cue of "BLT" pt able to recall all 3 precautions. Per MD verbal order, donned thigh high compression hose BLE at bed level with total assist. Pt completed log rolling to left with supervision and attempted sidelying to sit however self initiating return to supine and complaining of feeling like he might "pass out".  Vitals assessed in supine: BP 114/77 pulse 87.  Attempted sidelying to sit again in efforts to assess BP upright, however pt returning to supine again abruptly with same complaint therefore unable to take BP sitting EOB.  Positioned pt in supported sitting in bed with HOB elevated and BLE lowered and BP taken: 117/81.  Pt asymptomatic in this supported sitting position.  Educated pt on use of AE for LB bathing and dressing for good follow through of spinal precautions during self care using visual demonstration and discussion including use of reacher, long handled sponge, and shoe cuff.  Call bell in reach at  end of session.  Therapy Documentation Precautions:  Precautions Precautions: Back Precaution Booklet Issued: No Precaution Comments: Pt unable to recall 3/3 back precautions Restrictions Weight Bearing Restrictions: No    Therapy/Group: Individual Therapy  Amie Critchley 01/31/2021, 4:10 PM

## 2021-01-31 NOTE — Progress Notes (Signed)
Physical Therapy Session Note  Patient Details  Name: Louis [name] MRN: [id number] Date of Birth: 04-13-1966  Today's Date: 01/31/2021 PT Individual Time:  -      Short Term Goals: Week 1:  PT Short Term Goal 1 (Week 1): Pt will ambulate 3ft minA PT Short Term Goal 2 (Week 1): Pt will complete sit to stand CGA PT Short Term Goal 3 (Week 1): Pt will be able to recall 3/3 back precautions PT Short Term Goal 4 (Week 1): Pt will be able to complete bed mobility MinA PT Short Term Goal 5 (Week 1): Pt will initiate stair training  Skilled Therapeutic Interventions/Progress Updates:  Patient supine in bed on entrance to room. Wife present. Patient alert and agreeable to PT session. Patient with complaint of 10/10 pain with positioning in use of Kreg Bed to more upright standing position. BP taken during session with all readings WFL.   Therapeutic Activity: Bed Mobility: Questioned pt re: technique used for bed mobility to reach seated position. Pt does not know and educated as to log rolling technique. Patient requires ModA to reach proper hooklying position and bring hips and shoulders over in roll at same time. Required Mod A and extra time to push up to seated position d/t weakness and pain. Upon reaching seated position, pt immediately rolls eyes back and falls backward onto bed and is awake and alert as soon as he hits the bed surface. States dizziness and requires Min A to bring BLE to bed surface. VC/ tc required throughout for proper technique and to remain within spinal precautions. Second attempt to sit with improved results, however pt continues to experience dizziness on reaching seated position. No more quick, syncopal episodes.   Kreg Bed features utilized for tilt table in order to promote WB and improve physiological responses to changes in position. Pt secured with strap above knees and slowly brought to 57 degrees upright (in 10 degree increments) where pt then begins to feel 10/ 10 pain  in lower back/legs and also relates increase in dizziness. Bil knees buckle against strap and pt able to tolerate position for 25 sec. Slowly decreased tilt angle within pt's tolerance back to supine. Pt then positioned with knees elevated and HOB gently elevated to pt's improvement of pain symptoms to 3/ 10.   Patient supine  in bed at end of session with brakes locked, bed alarm set, and all needs within reach.     Therapy Documentation Precautions:  Precautions Precautions: Back Precaution Booklet Issued: No Precaution Comments: Pt unable to recall 3/3 back precautions Restrictions Weight Bearing Restrictions: No  Therapy/Group: Individual Therapy  Loel Dubonnet PT, DPT 01/31/2021, 9:01 AM

## 2021-02-01 DIAGNOSIS — R5381 Other malaise: Secondary | ICD-10-CM | POA: Diagnosis not present

## 2021-02-01 LAB — VANCOMYCIN, TROUGH: Vancomycin Tr: 14 ug/mL — ABNORMAL LOW (ref 15–20)

## 2021-02-01 LAB — VANCOMYCIN, PEAK: Vancomycin Pk: 27 ug/mL — ABNORMAL LOW (ref 30–40)

## 2021-02-01 LAB — GLUCOSE, CAPILLARY
Glucose-Capillary: 182 mg/dL — ABNORMAL HIGH (ref 70–99)
Glucose-Capillary: 196 mg/dL — ABNORMAL HIGH (ref 70–99)
Glucose-Capillary: 237 mg/dL — ABNORMAL HIGH (ref 70–99)
Glucose-Capillary: 226 mg/dL — ABNORMAL HIGH (ref 70–99)
Glucose-Capillary: 254 mg/dL — ABNORMAL HIGH (ref 70–99)

## 2021-02-01 MED ORDER — ENSURE MAX PROTEIN PO LIQD
11.0000 [oz_av] | Freq: Two times a day (BID) | ORAL | Status: DC
  Administered 2021-02-04 – 2021-02-20 (×9): 11 [oz_av] via ORAL

## 2021-02-01 MED ORDER — INSULIN ASPART 100 UNIT/ML IJ SOLN
0.0000 [IU] | Freq: Every day | INTRAMUSCULAR | Status: DC
Start: 2021-02-01 — End: 2021-02-20
  Administered 2021-02-01 – 2021-02-02 (×2): 3 [IU] via SUBCUTANEOUS
  Administered 2021-02-05 – 2021-02-07 (×2): 2 [IU] via SUBCUTANEOUS

## 2021-02-01 MED ORDER — INSULIN ASPART 100 UNIT/ML IJ SOLN
0.0000 [IU] | Freq: Three times a day (TID) | INTRAMUSCULAR | Status: DC
Start: 2021-02-01 — End: 2021-02-20
  Administered 2021-02-01 – 2021-02-02 (×2): 3 [IU] via SUBCUTANEOUS
  Administered 2021-02-02: 2 [IU] via SUBCUTANEOUS
  Administered 2021-02-02 – 2021-02-03 (×2): 1 [IU] via SUBCUTANEOUS
  Administered 2021-02-03: 3 [IU] via SUBCUTANEOUS
  Administered 2021-02-03 – 2021-02-04 (×2): 2 [IU] via SUBCUTANEOUS
  Administered 2021-02-04: 5 [IU] via SUBCUTANEOUS
  Administered 2021-02-04: 3 [IU] via SUBCUTANEOUS
  Administered 2021-02-05: 2 [IU] via SUBCUTANEOUS
  Administered 2021-02-05: 3 [IU] via SUBCUTANEOUS
  Administered 2021-02-05 – 2021-02-06 (×2): 2 [IU] via SUBCUTANEOUS
  Administered 2021-02-06: 1 [IU] via SUBCUTANEOUS
  Administered 2021-02-07 (×2): 2 [IU] via SUBCUTANEOUS
  Administered 2021-02-07: 1 [IU] via SUBCUTANEOUS
  Administered 2021-02-08: 4 [IU] via SUBCUTANEOUS
  Administered 2021-02-08: 1 [IU] via SUBCUTANEOUS
  Administered 2021-02-09 (×2): 3 [IU] via SUBCUTANEOUS
  Administered 2021-02-10: 2 [IU] via SUBCUTANEOUS
  Administered 2021-02-10: 7 [IU] via SUBCUTANEOUS
  Administered 2021-02-10: 2 [IU] via SUBCUTANEOUS
  Administered 2021-02-11 (×2): 1 [IU] via SUBCUTANEOUS
  Administered 2021-02-12: 2 [IU] via SUBCUTANEOUS
  Administered 2021-02-12: 1 [IU] via SUBCUTANEOUS
  Administered 2021-02-13: 5 [IU] via SUBCUTANEOUS
  Administered 2021-02-13: 2 [IU] via SUBCUTANEOUS
  Administered 2021-02-14: 1 [IU] via SUBCUTANEOUS
  Administered 2021-02-14: 2 [IU] via SUBCUTANEOUS
  Administered 2021-02-14: 3 [IU] via SUBCUTANEOUS
  Administered 2021-02-15: 1 [IU] via SUBCUTANEOUS
  Administered 2021-02-15: 2 [IU] via SUBCUTANEOUS
  Administered 2021-02-16: 1 [IU] via SUBCUTANEOUS
  Administered 2021-02-17 (×2): 2 [IU] via SUBCUTANEOUS
  Administered 2021-02-18: 3 [IU] via SUBCUTANEOUS
  Administered 2021-02-18 – 2021-02-19 (×2): 2 [IU] via SUBCUTANEOUS
  Administered 2021-02-19: 5 [IU] via SUBCUTANEOUS
  Administered 2021-02-19: 1 [IU] via SUBCUTANEOUS
  Administered 2021-02-20: 3 [IU] via SUBCUTANEOUS

## 2021-02-01 NOTE — Progress Notes (Signed)
Occupational Therapy Session Note  Patient Details  Name: Louis [name] MRN: [id number] Date of Birth: September 20, 1965  Today's Date: 02/01/2021 OT Individual Time: 8938-1017 OT Individual Time Calculation (min): 53 min    Short Term Goals: Week 1:  OT Short Term Goal 1 (Week 1): Patient will complete bed mobility and functional transfers with CGA OT Short Term Goal 2 (Week 1): patient will complete bathing and toileting with min A OT Short Term Goal 3 (Week 1): patient will complete dressing tasks with min A using assistive devices with good carryover OT Short Term Goal 4 (Week 1): patient will tolerate standing activities and basic HM for 5-10 minutes with CGA  Skilled Therapeutic Interventions/Progress Updates:    Pt received asleep semi-reclined in bed, easily awakened to voice and no c/o pain throughout session, agreeable to therapy. Session focus on self-care retraining, activity tolerance, func transfers, BLE strengthening in prep for improved ADL/IADL/func mobility performance + decreased caregiver burden. Came to sitting EOB with close S via log roll and elevated HOB, req increased time in order to prevent dizziness. Pt ind recalled back precautions when prompted. Squat-pivot transfer > w/c placed on his R with CGA and min Vcs for technique. Donned hospital gown with assist to tie up in back and tie up sleeves 2/2 IV. NT present for blood sugar check. Total A w/c transport to and from gym 2/2 time management and energy conservation. Stand-pivot x3 throughout session with CGA and RW. Completed 2x5 STS with RW from elevated than lowered height surface, progressing from being unable to stand from lower height at first to completing all with CGA to S. Denies fatigue but endorses "generalized weakness."    Pt left in recliner with safety belt alarm engaged, call bell in reach, and all immediate needs met. Wife present.   Therapy Documentation Precautions:  Precautions Precautions: Back Precaution  Booklet Issued: No Precaution Comments: Pt unable to recall 3/3 back precautions Restrictions Weight Bearing Restrictions: No  Pain: no c/o throughout   ADL: See Care Tool for more details.  Therapy/Group: Individual Therapy  Volanda Napoleon MS, OTR/L  02/01/2021, 6:39 AM

## 2021-02-01 NOTE — Progress Notes (Signed)
Physical Therapy Session Note  Patient Details  Name: Louis [name] MRN: [id number] Date of Birth: 1965/07/22  Today's Date: 02/01/2021 PT Individual Time: 1048-1200 PT Individual Time Calculation (min): 72 min   Short Term Goals: Week 1:  PT Short Term Goal 1 (Week 1): Pt will ambulate 78ft minA PT Short Term Goal 2 (Week 1): Pt will complete sit to stand CGA PT Short Term Goal 3 (Week 1): Pt will be able to recall 3/3 back precautions PT Short Term Goal 4 (Week 1): Pt will be able to complete bed mobility MinA PT Short Term Goal 5 (Week 1): Pt will initiate stair training  Skilled Therapeutic Interventions/Progress Updates:  Patient seated upright in recliner on entrance to room. Patient alert and agreeable to PT session. Patient relates increasing pain at start of session. RN notified and able to provide pain medication near start of session time.   Therapeutic Activity: Transfers: Patient performed STS transfers recliner to RW with CGA. No nystagmus noted with change in head position as pt requires lean over walker then press up to full upright posture. Standing bouts for up to 1 minute prior to pt's request and move to sit. Requires consistent seated therapeutic rest breaks to manage pain in back.   Education provided throughout and at end of session re: pain, current need for pain medication, pt's current scheduled and as-needed medications, change from acute care scheduled medication to in-patient scheduled medications, continuing to move throughout day in order to control swelling at surgical site and retrain body re: movement through and with pain.  Manual Therapy: Pt relates pain in R lower leg at peroneals indicative of neurapraxia along sciatic branch. Nerve glides performed distal to proximal to promote increased neural mobility and decreased pain.  Gait Training:  Patient ambulated 30' x1 total including one 180deg turn around bed and back to recliner using RW and demonstrating  flexed forward posture over RW. CGA/ Min A provided throughout with increasing support to lateral hips/ trochanters for increased support. Demonstrated flexed posture and decreasing step height from start to finish. Increased fatigue and pain at end of bout.  Patient seated  in recliner at end of session with brakes locked, belt alarm set, and all needs within reach. Wife present.     Therapy Documentation Precautions:  Precautions Precautions: Back Precaution Booklet Issued: No Precaution Comments: Pt unable to recall 3/3 back precautions Restrictions Weight Bearing Restrictions: No  Therapy/Group: Individual Therapy  Loel Dubonnet PT, DPT 02/01/2021, 7:20 PM

## 2021-02-01 NOTE — Progress Notes (Addendum)
PROGRESS NOTE   Subjective/Complaints:  Pt hungry 2h after breakfast even with TF running , was not aware he is on an appetite stimulant   ROS: Patient denies CP, SOB, N/V/D  Objective:   No results found. No results for input(s): WBC, HGB, HCT, PLT in the last 72 hours. Recent Labs    01/31/21 0603  NA 127*  K 3.4*  CL 92*  CO2 28  GLUCOSE 181*  BUN 8  CREATININE 0.44*  CALCIUM 8.1*     Intake/Output Summary (Last 24 hours) at 02/01/2021 0856 Last data filed at 02/01/2021 0815 Gross per 24 hour  Intake 932 ml  Output 2125 ml  Net -1193 ml      Pressure Injury 01/28/21 Coccyx Left Stage 2 -  Partial thickness loss of dermis presenting as a shallow open injury with a red, pink wound bed without slough. pink, moist (Active)  01/28/21 1845  Location: Coccyx  Location Orientation: Left  Staging: Stage 2 -  Partial thickness loss of dermis presenting as a shallow open injury with a red, pink wound bed without slough.  Wound Description (Comments): pink, moist  Present on Admission: Yes    Physical Exam: Vital Signs Blood pressure 131/81, pulse 88, temperature 98.8 F (37.1 C), temperature source Oral, resp. rate 18, height 5\' 11"  (1.803 m), weight 98.3 kg, SpO2 97 %.    General: No acute distress Mood and affect are appropriate Heart: Regular rate and rhythm no rubs murmurs or extra sounds Lungs: Clear to auscultation, breathing unlabored, no rales or wheezes Abdomen: Positive bowel sounds, soft nontender to palpation, nondistended Extremities: No clubbing, cyanosis, or edema Skin: No evidence of breakdown, no evidence of rash   Neuro:  Alert and oriented x 3. Normal insight and awareness. Intact Memory. Normal language and speech. Cranial nerve exam unremarkable. UE 4/5 prox to distal. BLE 3/5 prox  to 4/5 distal. No focal sensory abnl.  Musculoskeletal: back/pelvis tender to palpation and with AROM/PROM     Assessment/Plan: 1. Functional deficits which require 3+ hours per day of interdisciplinary therapy in a comprehensive inpatient rehab setting. Physiatrist is providing close team supervision and 24 hour management of active medical problems listed below. Physiatrist and rehab team continue to assess barriers to discharge/monitor patient progress toward functional and medical goals  Care Tool:  Bathing  Bathing activity did not occur: Refused           Bathing assist       Upper Body Dressing/Undressing Upper body dressing   What is the patient wearing?: Pull over shirt    Upper body assist Assist Level: Maximal Assistance - Patient 25 - 49%    Lower Body Dressing/Undressing Lower body dressing      What is the patient wearing?: Pants, Underwear/pull up     Lower body assist Assist for lower body dressing: Total Assistance - Patient < 25%     Toileting Toileting    Toileting assist Assist for toileting: Independent with assistive device Assistive Device Comment: Urinal   Transfers Chair/bed transfer  Transfers assist     Chair/bed transfer assist level: Moderate Assistance - Patient 50 - 74% (RW)  Locomotion Ambulation   Ambulation assist      Assist level: 2 helpers Assistive device: Walker-rolling Max distance: 12-15'   Walk 10 feet activity   Assist  Walk 10 feet activity did not occur: Safety/medical concerns    Assistive device: Walker-rolling   Walk 50 feet activity   Assist Walk 50 feet with 2 turns activity did not occur: Safety/medical concerns  Assist level: Total Assistance - Patient < 25%      Walk 150 feet activity   Assist Walk 150 feet activity did not occur: Safety/medical concerns         Walk 10 feet on uneven surface  activity   Assist Walk 10 feet on uneven surfaces activity did not occur: Safety/medical concerns         Wheelchair     Assist Will patient use wheelchair at discharge?:  No Type of Wheelchair: Manual (indicated per PT note)    Wheelchair assist level: Contact Guard/Touching assist Max wheelchair distance: 150    Wheelchair 50 feet with 2 turns activity    Assist        Assist Level: Contact Guard/Touching assist   Wheelchair 150 feet activity     Assist      Assist Level: Contact Guard/Touching assist   Blood pressure 131/81, pulse 88, temperature 98.8 F (37.1 C), temperature source Oral, resp. rate 18, height 5\' 11"  (1.803 m), weight 98.3 kg, SpO2 97 %.  Medical Problem List and Plan: 1.  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              -patient may shower             -ELOS/Goals: 12 to 15 days, modified independent goals with PT and OT  -Continue CIR therapies including PT, OT team conf today  2.  Antithrombotics: -DVT/anticoagulation:  Pharmaceutical: Lovenox- don't anticipate need at discharge              -antiplatelet therapy: Baby aspirin daily 3. Pain Management: OxyContin 10 mg every 12 hours with oxycodone 5 mg every 4 hours as needed             -Robaxin 500 mg every 6 hours as needed for spasms             -Voltaren gel for topical relief             -Added K pad for muscle spasm and low back pain             -Tylenol for mild pain 4. Mood: Team to provide ego support             -continue Effexor XR 75 mg daily             -antipsychotic agents: N/A, had been haldol before 5. Neuropsych: This patient is capable of making decisions on his own behalf. -Patient's mental status appears to be improving  6. Skin/Wound Care: Maximize nutritional intake, local skin care as needed 7. Fluids/Electrolytes/Severe Protein malutrition: Patient now is on regular diet.  Encourage p.o. intake.          eating 100% meals , fluid intake variable , able to take pills po, d/c cortrak, d/c megace 8.  Sepsis secondary to discitis and epidural abscess/bacteremia             -IV  vancomycin and sertraline until today, then to IV vancomycin alone for 6 weeks             -  Monitor clinically and with regular labs 9.  Uncontrolled type 2 diabetes             -Lantus insulin 5 units with sliding scale insulin coverage--increased to 10 units 8/6             -improving control  CBG (last 3)  Recent Labs    01/31/21 2358 02/01/21 0403 02/01/21 0823  GLUCAP 206* 182* 196*    Fair control monitor on current dose - should improve off TF  10.  Chronic diastolic congestive heart failure             -Monitor daily weights             -Anasarca most likely due to nutritional status as opposed to cardiac             -Recent 2D echo demonstrated no wall abnormalities   Filed Weights   01/29/21 0403 01/30/21 0424 01/31/21 0400  Weight: 102.3 kg 102.1 kg 98.3 kg   Down 4 kg, which is  consistent since he is down 4L since admit 11.  Hyponatremia- reduce Free H20 to              -Hypokalemia, likely due to lasix will supplement K, x 1 d , should improve off lasix recheck on thurs 12. Anemia d/t illness 13. Thrombocytosis    LOS: 4 days A FACE TO FACE EVALUATION WAS PERFORMED  Erick Colace 02/01/2021, 8:56 AM

## 2021-02-01 NOTE — IPOC Note (Signed)
Overall Plan of Care [name]) Patient Details Name: Louis [name] MRN: [id number] DOB: [birth year]  Admitting Diagnosis: Debility  Hospital Problems: Principal Problem:   Debility Active Problems:   Epidural abscess   Pressure injury of skin     Functional Problem List: Nursing Edema, Pain, Skin Integrity  PT Balance, Behavior, Endurance, Motor, Nutrition, Pain, Safety, Skin Integrity  OT Balance, Edema, Endurance, Motor, Pain, Safety  SLP    TR         Basic ADL's: OT Grooming, Bathing, Toileting, Dressing     Advanced  ADL's: OT Simple Meal Preparation     Transfers: PT Bed Mobility, Bed to Chair, Car, Occupational psychologist, Research scientist (life sciences): PT Ambulation, Psychologist, prison and probation services, Stairs     Additional Impairments: OT    SLP        TR      Anticipated Outcomes Item Anticipated Outcome  Self Feeding independent  Swallowing      Basic self-care  set up/supervision  Toileting  supervision   Bathroom Transfers supervision  Bowel/Bladder  remain continent and maintain regular patterns  Transfers  ModI  Locomotion  Ambulatory Supervision with LRAD  Communication     Cognition     Pain  less than 4  Safety/Judgment  no falls, skin breakdown and infection   Therapy Plan: PT Intensity: Minimum of 1-2 x/day ,45 to 90 minutes PT Frequency: 5 out of 7 days PT Duration Estimated Length of Stay: 12-15 days OT Intensity: Minimum of 1-2 x/day, 45 to 90 minutes OT Frequency: 5 out of 7 days OT Duration/Estimated Length of Stay: 10-14 days     Due to the current state of emergency, patients may not be receiving their 3-hours of Medicare-mandated therapy.   Team Interventions: Nursing Interventions Patient/Family Education, Skin Care/Wound Management, Pain Management  PT interventions Ambulation/gait training, Discharge planning, Functional mobility training, Psychosocial support, Therapeutic Activities, Visual/perceptual remediation/compensation,  Balance/vestibular training, Disease management/prevention, Neuromuscular re-education, Skin care/wound management, Therapeutic Exercise, Wheelchair propulsion/positioning, Cognitive remediation/compensation, DME/adaptive equipment instruction, Pain management, Splinting/orthotics, UE/LE Strength taining/ROM, Community reintegration, Equities trader education, Development worker, international aid stimulation, Museum/gallery curator, UE/LE Coordination activities  OT Interventions Warden/ranger, Self Care/advanced ADL retraining, Therapeutic Exercise, DME/adaptive equipment instruction, Pain management, UE/LE Strength taining/ROM, Patient/family education, Discharge planning, Functional mobility training, Therapeutic Activities  SLP Interventions    TR Interventions    SW/CM Interventions Discharge Planning, Psychosocial Support, Patient/Family Education, Disease Management/Prevention   Barriers to Discharge MD  Medical stability  Nursing      PT Decreased caregiver support, Behavior, Nutrition means, Incontinence    OT      SLP      SW IV antibiotics, Insurance for SNF coverage     Team Discharge Planning: Destination: PT-Home ,OT- Home , SLP-  Projected Follow-up: PT-Home health PT, OT-  Home health OT, SLP-  Projected Equipment Needs: PT-To be determined, OT-  , SLP-  Equipment Details: PT- , OT-owns walker, commode, shower seat Patient/family involved in discharge planning: PT- Patient,  OT-Patient, SLP-   MD ELOS: 12-16d Medical Rehab Prognosis:  Good Assessment:  55 year old male with a history of diabetes, hypertension, CAD with CABG in 2017, and chronic pain.  He presented to [name] July 13 with 5 days of increasing back pain radiating to both lower extremities.  History is also notable for a lumbar laminectomy in late June for L3-S1 discitis and associated psoas abscess.  The plan was for daptomycin through August 10 for MRSA coverage.  Upon readmission in July lumbarized  showed multiple fluid collections at L4-L5.  Neurosurgery was consulted in did not recommend surgical intervention.  TEE negative for vegetation.  Interventional radiology drained 5 cc of fluid at the L3 level.  Patient was placed [name] vancomycin and cefazolin [name] July 21.  Recent blood cultures are negative and white blood cell counts are improving.  Serial imaging has demonstrated decrease in abscess size.  Patient was receiving core track feeds for some time but now has begun [name] p.o. diet.  Patient has been mobilized by therapies and demonstrated significant functional deficits in regards to self-care and basic mobility.  He was felt that he would benefit from an inpatient rehab stay to maximize functional gains while medical issues were managed in the hospital setting.    See Team Conference Notes for weekly updates to the plan of care

## 2021-02-01 NOTE — Progress Notes (Signed)
Occupational Therapy Session Note  Patient Details  Name: Louis [name] MRN: [id number] Date of Birth: [birth year]  Today's Date: 02/01/2021 OT Individual Time: 2376-2831 OT Individual Time Calculation (min): 53 min    Short Term Goals: Week 1:  OT Short Term Goal 1 (Week 1): Patient will complete bed mobility and functional transfers with CGA OT Short Term Goal 2 (Week 1): patient will complete bathing and toileting with min A OT Short Term Goal 3 (Week 1): patient will complete dressing tasks with min A using assistive devices with good carryover OT Short Term Goal 4 (Week 1): patient will tolerate standing activities and basic HM for 5-10 minutes with CGA  Skilled Therapeutic Interventions/Progress Updates:    Pt up in recliner to start session.  He was able to transfer to the wheelchair with use of the RW and min assist.  Took him down to the ortho gym where he transferred to the Nustep with min assist as well.  Worked on overall strengthening with use of UEs and LEs for 8 mins on level 4 resistance and average number of steps from 18-24.  Focused on pt keeping knees in alignment with hips and not letting them fall in or outward.  He completed a second set after 3-4 mins rest with isolated use of the LEs only.  He could only tolerate 3.5 mins with use of his hands on his knees at times to help control the movement.  Increased fatigue reported with transfer back to the wheelchair at min assist level with use of the RW.  Once back in the room had pt stand and ambulate back to his recliner on the window side of the bed from just in front of the sink.  Min assist for sit to stand with the walker and for initial ambulation, however after taking a few steps, he collapsed in the walker down on his knees with therapist providing max assist to stand back up.  He collapsed again before reaching the recliner after af few more steps with max assist from therapist provided to keep him from going all the way down  into the floor.  No increased pain reported with pt only stating he was just fatigued.  Pt left in the recliner at end of session with spouse in the room as well.  Nursing made aware of fall as well.  Recommend only stand pivot transfers with the RW for toileting tasks and no ambulation with nursing staff.    Therapy Documentation Precautions:  Precautions Precautions: Back Precaution Booklet Issued: No Precaution Comments: Pt unable to recall 3/3 back precautions Restrictions Weight Bearing Restrictions: No  Pain: Pain Assessment Pain Scale: Faces Faces Pain Scale: Hurts a little bit Pain Type: Surgical pain Pain Location: Leg Pain Orientation: Lower;Right Pain Descriptors / Indicators: Discomfort Pain Onset: With Activity Pain Intervention(s): Repositioned ADL: See Care Tool Section for some details of mobility and selfcare   Therapy/Group: Individual Therapy  Herschell Virani OTR/L 02/01/2021, 5:03 PM

## 2021-02-01 NOTE — Progress Notes (Addendum)
Nutrition Follow-up  DOCUMENTATION CODES:   Severe malnutrition in context of acute illness/injury  INTERVENTION:  Continue Ensure Max po BID, each supplement provides 150 kcal and 30 grams of protein.   Continue Magic cup TID with meals, each supplement provides 290 kcal and 9 grams of protein  Continue Juven BID, each packet provides 95 calories, 2.5 grams of protein.  Encourage adequate PO intake.   NUTRITION DIAGNOSIS:   Severe Malnutrition related to acute illness (MRSA bacteremia) as evidenced by mild fat depletion, moderate muscle depletion, energy intake < or equal to 50% for > or equal to 5 days; ongoing  GOAL:   Patient will meet greater than or equal to 90% of their needs; progressing  MONITOR:   PO intake, Supplement acceptance, Skin, Weight trends, Labs, I & O's  REASON FOR ASSESSMENT:   Malnutrition Screening Tool    ASSESSMENT:   55 yo male admitted to rehab 8/5 from [facility] acute care where he was admitted for worsening back pain after laminectomy on 6/28.  Drain was placed for subdural fluid collections. Patient was treated for MRSA bacteremia. TEE negative for vegetation. PMH includes DM, CAD, HTN, HLD, sleep apnea.  Meal completion has been 85-100%. Pt tolerating his PO diet. Cortrak removed today per MD orders. Tube feeding discontinued. Pt currently has Boost Breeze ordered however has been refusing them. MD has ordered Ensure max instead to aid in caloric and protein needs. Will continue with current orders. Pt encouraged to eat his food at meals and to drink his supplements.   Labs and medications reviewed.  Diet Order:   Diet Order             Diet heart healthy/carb modified Room service appropriate? Yes; Fluid consistency: Thin  Diet effective now                   EDUCATION NEEDS:   Education needs have been addressed  Skin:  Skin Assessment: Reviewed RN Assessment Skin Integrity Issues:: Stage II Stage II: coccyx  Last BM:   8/9  Height:   Ht Readings from Last 1 Encounters:  01/28/21 5\' 11"  (1.803 m)    Weight:   Wt Readings from Last 1 Encounters:  01/31/21 98.3 kg   BMI:  Body mass index is 30.23 kg/m.  Estimated Nutritional Needs:   Kcal:  2200-2400  Protein:  120-140 gm  Fluid:  >/= 2.2 L  04/02/21, MS, RD, LDN RD pager number/after hours weekend pager number on Amion.

## 2021-02-01 NOTE — Progress Notes (Signed)
Pharmacy Antibiotic Note  Louis [name] is a 55 y.o. male admitted on 01/05/21 with epidural abscess with MRSA bacteremia/lumbar osteomyelitis for Vancomycin.  Transferred to Rehab on 01/28/21.   Scr has been low stable at < 1. Vancomycin levels were collected around this morning's dose.  Timed and collected appropriately. Last prior levels 8/4-8/5 just prior to transfer to Rehab.    Vancomycin peak = 27 mcg/ml.    Vancomycin trough = 14 mcg/ml.    AUC = 492, remains in goal range of 400-550  Plan: Continue Vancomycin 1500 mg q 12 hours Vancomycin planned thru 03/10/21 Will plan to check Vanc levels weekly or sooner if needed. Monitor renal function at least q 72 hours Follow clinical progress.    Height: 5\' 11"  (180.3 cm) Weight: 98.3 kg (216 lb 11.4 oz) IBW/kg (Calculated) : 75.3  Temp (24hrs), Avg:98.4 F (36.9 C), Min:98.1 F (36.7 C), Max:98.8 F (37.1 C)  Recent Labs  Lab 01/27/21 0325 01/27/21 2222 01/28/21 0445 01/31/21 0603 02/01/21 0700 02/01/21 1036  WBC 8.0  --   --   --   --   --   CREATININE 0.48*  --   --  0.44*  --   --   VANCOTROUGH  --   --  22*  --  14*  --   VANCOPEAK  --  33  --   --   --  27*     Estimated Creatinine Clearance: 121.8 mL/min (A) (by C-G formula based on SCr of 0.44 mg/dL (L)).    No Known Allergies  Cubicin PTA >> 7/14 (initial plan through 8/10, last OPAT 7/13); 7/20 >>7/21 Ceftaroline 7/20 >> 8/5 Vanc 7/14 >>7/20; 7/21>> (9/15) Fluconazole 7/17>> 7/23 for oral thrush  7/18 VP 22, VT 9 (drawn late), AUC 385 on 1500 q12 >> incr 1250 q8 7/23: VP 28, VT 21,  AUC 575 >> change to 1750/12h for AUC 536 7/26-27: VP 31, VT 14 (drawn early), AUC 523 >> con't 1750mg  q12 8/4-8/5: VP 33, VT 22 ( both drawn early)- AUC 551>>Continue 1500 q 12 for now  8/9 VP 27, VT 14 (on time) - AUC 492, continue 1500 mg IV q12h   7/13 BCx - 3/4 MRSA 7/15 Bcx: staph aureus 7/16 wound cx - neg 7/17 Bcx- 3/4 MRSA 7/19 Bcx: MRSA 7/20 Bcx: staph aureus 7/22  Bcx - negative 7/24 Bcx - NGF 7/26 Bcx - NGF  8/24, RPh 02/01/2021, 12:46 PM

## 2021-02-02 DIAGNOSIS — R5381 Other malaise: Secondary | ICD-10-CM | POA: Diagnosis not present

## 2021-02-02 LAB — GLUCOSE, CAPILLARY
Glucose-Capillary: 160 mg/dL — ABNORMAL HIGH (ref 70–99)
Glucose-Capillary: 140 mg/dL — ABNORMAL HIGH (ref 70–99)
Glucose-Capillary: 223 mg/dL — ABNORMAL HIGH (ref 70–99)
Glucose-Capillary: 268 mg/dL — ABNORMAL HIGH (ref 70–99)

## 2021-02-02 LAB — VITAMIN B1: Vitamin B1 (Thiamine): 363.9 nmol/L — ABNORMAL HIGH (ref 66.5–200.0)

## 2021-02-02 NOTE — Progress Notes (Signed)
Patient ID: Louis [name], male   DOB: [birth year], 55 y.o.   MRN: [id number]  Patient IV ABX referral sent to [facility], pt was established with them previously.   Pt set to run through 9/16  [place], [place] [phone]

## 2021-02-02 NOTE — Plan of Care (Signed)
  Problem: Consults Goal: RH SPINAL CORD INJURY PATIENT EDUCATION Description:  See Patient Education module for education specifics.  Outcome: Progressing   Problem: RH SKIN INTEGRITY Goal: RH STG SKIN FREE OF INFECTION/BREAKDOWN Description: Free of breakdown and infection with min assist Outcome: Progressing   Problem: RH PAIN MANAGEMENT Goal: RH STG PAIN MANAGED AT OR BELOW PT'S PAIN GOAL Description: Pain lss than 4 Outcome: Progressing   Problem: RH BOWEL ELIMINATION Goal: RH STG MANAGE BOWEL WITH ASSISTANCE Description: STG Manage Bowel with mod I Assistance. Outcome: Progressing Goal: RH STG MANAGE BOWEL W/MEDICATION W/ASSISTANCE Description: STG Manage Bowel with Medication with mod I Assistance. Outcome: Progressing   Problem: RH KNOWLEDGE DEFICIT GENERAL Goal: RH STG INCREASE KNOWLEDGE OF SELF CARE AFTER HOSPITALIZATION Description: Patient will be able to manage self care and direct others to assist with care using handouts and educational resources with cues/reminders Outcome: Progressing

## 2021-02-02 NOTE — Patient Care Conference (Signed)
Inpatient RehabilitationTeam Conference and Plan of Care Update Date: 02/02/2021   Time: 10:01 AM    Patient Name: Louis [name]      Medical Record Number: [id number]  Date of Birth: 07-12-1965 Sex: Male         Room/Bed: 4W02C/4W02C-01 Payor Info: Payor: [employer]UE [employer] / Plan: [employer] PPO / Product Type: *No Product type* /    Admit Date/Time:  01/28/2021  6:24 PM  Primary Diagnosis:  Debility  Hospital Problems: Principal Problem:   Debility Active Problems:   Epidural abscess   Pressure injury of skin    Expected Discharge Date: Expected Discharge Date: 02/17/21  Team Members Present: Physician leading conference: Dr. Claudette Laws Social Worker Present: Lavera Guise, BSW Nurse Present: Chana Bode, RN PT Present: Grier Rocher, PT OT Present: Annye English, OT SLP Present: Eilene Ghazi, SLP PPS Coordinator present : Fae Pippin, SLP     Current Status/Progress Goal Weekly Team Focus  Bowel/Bladder   Continent of bowel and bladder, occasional incontinence of bowel. LBM-8/9  Remain continent of B/B  Assess B/B every shift and prn, assist with toileting needs.   Swallow/Nutrition/ Hydration             ADL's   set-up with urinal, S for seated grooming and UBD, UBB, max A for LBD - needs more practice with AE, CGA stand-pivot to toilet with RW; several days limited by "feelings of passing out"  S for showering/bathroom transfers, toileting, light meal prep, mod I remaining ADL  self-care/balance/transfer retraining, pt/family/DME/AE education, energy conservation   Mobility   Bed mobility = up to Mod A especially for technique and maintaining spinal precautions, transfers = CGA/ min A, ambulation = CGA/ Min A with RW; severe increases in LBP/ surgical pain with radiating symptoms in to BLE affecting mobility, activity tolerance, strength, no stair training yet  overall Mod I/ supervision  increasing activity toerance/ strength, decreasing  pain, general strengthening, increaing time in ambulation/ activity, improving LOA required for all mobility   Communication             Safety/Cognition/ Behavioral Observations            Pain   Reports frequent pain 8/10 to back radiating to legs, managed with scheduled and prn oxycodone-partial effects noted.  Pain <3/10  Assess and address pain every shift and prn   Skin   Stage I wound noted to sacrum, healing appropriately  Promote healing, no further episodes of skin impairment  Assess and address skin every shift and prn     Discharge Planning:  Discharging home with spouse able to provide superivison to Min A, 24/7   Team Discussion: Patient reports hip and back pain. Encourage patient to take pain medications at HS. Intake improved post cortrak removal. Will need PICC for IV abx through 03/11/21.  STage 2 on buttocks.  Patient on target to meet rehab goals: yes, currently min assist for sit - stand and toileting., upper body bathing and dressing. Requires max assist for lower body dressing and min - CGA for pivot to toilet. Patient has difficulty coming supine to sit but then able to ambulate 20' with CGA - min assist. Discharge goals set for supervision overall.  *See Care Plan and progress notes for long and short-term goals.   Revisions to Treatment Plan:  Change to 15/7 therapy schedule   Teaching Needs: Safety, medications, skin care, etc  Current Barriers to Discharge: Decreased caregiver support, Home enviroment access/layout, IV  antibiotics, and Wound care  Possible Resolutions to Barriers: Family education     Medical Summary Current Status: Still on IV antibiotics, vancomycin, back pain not taking his short acting oxycodone consistently.  Barriers to Discharge: Medical stability   Possible Resolutions to Barriers/Weekly Focus: Encourage patient to take p.m. dose of short acting pain medication, continue wound care as well as IV antibiotics   Continued  Need for Acute Rehabilitation Level of Care: The patient requires daily medical management by a physician with specialized training in physical medicine and rehabilitation for the following reasons: Direction of a multidisciplinary physical rehabilitation program to maximize functional independence : Yes Medical management of patient stability for increased activity during participation in an intensive rehabilitation regime.: Yes Analysis of laboratory values and/or radiology reports with any subsequent need for medication adjustment and/or medical intervention. : Yes   I attest that I was present, lead the team conference, and concur with the assessment and plan of the team.   Chana Bode B 02/02/2021, 12:12 PM

## 2021-02-02 NOTE — Progress Notes (Signed)
PROGRESS NOTE   Subjective/Complaints:  Some increased pain in low back rather than midback  ROS: Patient denies CP, SOB, N/V/D  Objective:   No results found. No results for input(s): WBC, HGB, HCT, PLT in the last 72 hours. Recent Labs    01/31/21 0603  NA 127*  K 3.4*  CL 92*  CO2 28  GLUCOSE 181*  BUN 8  CREATININE 0.44*  CALCIUM 8.1*     Intake/Output Summary (Last 24 hours) at 02/02/2021 0849 Last data filed at 02/02/2021 0500 Gross per 24 hour  Intake 596 ml  Output 1800 ml  Net -1204 ml      Pressure Injury 01/28/21 Coccyx Left Stage 2 -  Partial thickness loss of dermis presenting as a shallow open injury with a red, pink wound bed without slough. pink, moist (Active)  01/28/21 1845  Location: Coccyx  Location Orientation: Left  Staging: Stage 2 -  Partial thickness loss of dermis presenting as a shallow open injury with a red, pink wound bed without slough.  Wound Description (Comments): pink, moist  Present on Admission: Yes    Physical Exam: Vital Signs Blood pressure 129/86, pulse 86, temperature 98.8 F (37.1 C), temperature source Oral, resp. rate 20, height 5\' 11"  (1.803 m), weight 102.9 kg, SpO2 100 %.  General: No acute distress Mood and affect are appropriate Heart: Regular rate and rhythm no rubs murmurs or extra sounds Lungs: Clear to auscultation, breathing unlabored, no rales or wheezes Abdomen: Positive bowel sounds, soft nontender to palpation, nondistended Extremities: No clubbing, cyanosis, or edema Skin: No evidence of breakdown, no evidence of rash Neuro:  Alert and oriented x 3. Normal insight and awareness. Intact Memory. Normal language and speech. Cranial nerve exam unremarkable. UE 4/5 prox to distal. BLE 3/5 prox  to 4/5 distal. No focal sensory abnl.  Musculoskeletal: back/pelvis tender to palpation and with AROM/PROM    Assessment/Plan: 1. Functional deficits which  require 3+ hours per day of interdisciplinary therapy in a comprehensive inpatient rehab setting. Physiatrist is providing close team supervision and 24 hour management of active medical problems listed below. Physiatrist and rehab team continue to assess barriers to discharge/monitor patient progress toward functional and medical goals  Care Tool:  Bathing  Bathing activity did not occur: Refused           Bathing assist       Upper Body Dressing/Undressing Upper body dressing   What is the patient wearing?: Pull over shirt    Upper body assist Assist Level: Maximal Assistance - Patient 25 - 49%    Lower Body Dressing/Undressing Lower body dressing      What is the patient wearing?: Pants, Underwear/pull up     Lower body assist Assist for lower body dressing: Total Assistance - Patient < 25%     Toileting Toileting    Toileting assist Assist for toileting: Independent with assistive device Assistive Device Comment: Urinal   Transfers Chair/bed transfer  Transfers assist     Chair/bed transfer assist level: Minimal Assistance - Patient > 75% (stand pivot with the RW)     Locomotion Ambulation   Ambulation assist  Assist level: 2 helpers Assistive device: Walker-rolling Max distance: 12-15'   Walk 10 feet activity   Assist  Walk 10 feet activity did not occur: Safety/medical concerns    Assistive device: Walker-rolling   Walk 50 feet activity   Assist Walk 50 feet with 2 turns activity did not occur: Safety/medical concerns  Assist level: Total Assistance - Patient < 25%      Walk 150 feet activity   Assist Walk 150 feet activity did not occur: Safety/medical concerns         Walk 10 feet on uneven surface  activity   Assist Walk 10 feet on uneven surfaces activity did not occur: Safety/medical concerns         Wheelchair     Assist Will patient use wheelchair at discharge?: No Type of Wheelchair: Manual  (indicated per PT note)    Wheelchair assist level: Contact Guard/Touching assist Max wheelchair distance: 150    Wheelchair 50 feet with 2 turns activity    Assist        Assist Level: Contact Guard/Touching assist   Wheelchair 150 feet activity     Assist      Assist Level: Contact Guard/Touching assist   Blood pressure 129/86, pulse 86, temperature 98.8 F (37.1 C), temperature source Oral, resp. rate 20, height 5\' 11"  (1.803 m), weight 102.9 kg, SpO2 100 %.  Medical Problem List and Plan: 1.  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              -patient may shower             -ELOS/Goals: 12 to 15 days, modified independent goals with PT and OT  -Continue CIR therapies including PT, OT team conf today  2.  Antithrombotics: -DVT/anticoagulation:  Pharmaceutical: Lovenox- don't anticipate need at discharge              -antiplatelet therapy: Baby aspirin daily 3. Pain Management: OxyContin 10 mg every 12 hours with oxycodone 5 mg every 4 hours as needed             -Robaxin 500 mg every 6 hours as needed for spasms             -Voltaren gel for topical relief             -Added K pad for muscle spasm and low back pain             -Tylenol for mild pain 4. Mood: Team to provide ego support             -continue Effexor XR 75 mg daily             -antipsychotic agents: N/A, had been haldol before 5. Neuropsych: This patient is capable of making decisions on his own behalf. -Patient's mental status appears to be improving  6. Skin/Wound Care: Maximize nutritional intake, local skin care as needed 7. Fluids/Electrolytes/Severe Protein malutrition: Patient now is on regular diet.  Encourage p.o. intake.          eating 100% meals , fluid intake variable , able to take pills po,  8.  Sepsis secondary to discitis and epidural abscess/bacteremia             -IV vancomycin and sertraline until today, then to IV  vancomycin alone for 6 weeks             -Monitor clinically and with regular labs 9.  Uncontrolled type 2 diabetes             -Lantus insulin 5 units with sliding scale insulin coverage--increased to 10 units 8/6             -improving control  CBG (last 3)  Recent Labs    02/01/21 1707 02/01/21 2001 02/02/21 0610  GLUCAP 226* 254* 160*    Fair control monitor on current dose - should improve off TF , looks better this am  10.  Chronic diastolic congestive heart failure             -Monitor daily weights             -Anasarca most likely due to nutritional status as opposed to cardiac             -Recent 2D echo demonstrated no wall abnormalities   Filed Weights   01/30/21 0424 01/31/21 0400 02/02/21 0630  Weight: 102.1 kg 98.3 kg 102.9 kg  Weight up but I/O show continued diuresis 11.  Hyponatremia- reduce Free H20 to              -Hypokalemia, likely due to lasix will supplement K, x 1 d , should improve off lasix recheck in am  12. Anemia d/t illness 13. Thrombocytosis    LOS: 5 days A FACE TO FACE EVALUATION WAS PERFORMED  Erick Colace 02/02/2021, 8:49 AM

## 2021-02-02 NOTE — Progress Notes (Signed)
Physical Therapy Session Note  Patient Details  Name: Louis [name] MRN: [id number] Date of Birth: 06-28-1965  Today's Date: 02/02/2021 PT Individual Time: 7356-7014 PT Individual Time Calculation (min): 54 min   Short Term Goals: Week 1:  PT Short Term Goal 1 (Week 1): Pt will ambulate 53ft minA PT Short Term Goal 2 (Week 1): Pt will complete sit to stand CGA PT Short Term Goal 3 (Week 1): Pt will be able to recall 3/3 back precautions PT Short Term Goal 4 (Week 1): Pt will be able to complete bed mobility MinA PT Short Term Goal 5 (Week 1): Pt will initiate stair training  Skilled Therapeutic Interventions/Progress Updates:    Pt received while on the bedpan, pt agreeable to therapy and assist with tolieting. Bedpan shifted, saturating patient and sheets. Pt instructed in log roll technique for peri care / placement of chuck pad within back precautions and completed with CGA. Pt unable to completely donn underwear with bridging technique 2/2 back pain. Therapist completed donning underwear and donned thigh high teds totalA. Transitioned supine > EOB with minA for terminal trunk transitioning.  STS > RW within session MinA for steadying. Verbal cuing for correct hand placement. Therapist donned pants and socks total A. Pt completed stand pivot x2 >wc >mat table with RW MinA and monitoring signs/symptoms that would indicate possible fainting. Pt reported increased in lightheaded sx.   EOM exercises performed bilaterally with close supervision-CGA 2/2 lightheaded sx: Hip ABD with Tband x10  Hip ADD with ball squeeze x10  LAQ  x10  *One instance of near fainting while sitting on EOM, no LOB. Therapist monitored signs/sx closely including having pt converse throughout session. Pt completed squat pivot transfers x2 from mat table> wc >recliner with CGA-MinA 2/2 fatigue and lightheaded sx.  Pt required increased time with adjustment in positional changes and bed mobility 2/2 fatigue, pain, and  lightheaded sx.   Pt sitting in recliner with brakes on, chair alarm set, call bell within reach. Wife and NT present in room upon exist. All needs met at this time.    Therapy Documentation Precautions:  Precautions Precautions: Back Precaution Booklet Issued: No Precaution Comments: Pt unable to recall 3/3 back precautions Restrictions Weight Bearing Restrictions: No   Pain: Pain Assessment Pain Scale: 0-10 Pain Score: 8  Pain Type: Acute pain;Surgical pain Pain Location: Back Pain Orientation: Mid;Lower Pain Descriptors / Indicators: Discomfort;Sharp Pain Onset: With Activity Pain Intervention(s): MD notified (Comment);Repositioned (Pain medicine given after session) Multiple Pain Sites: Yes 2nd Pain Site Pain Score: 8 Pain Type: Acute pain Pain Location: Leg Pain Orientation: Right;Left  Therapy/Group: Individual Therapy  Jentry Warnell, SPT 02/02/2021, 6:09 PM

## 2021-02-02 NOTE — Progress Notes (Signed)
Patient ID: Louis [name], male   DOB: 1965-07-20, 55 y.o.   MRN: [id number] Team Conference Report to Patient/Family  Team Conference discussion was reviewed with the patient and caregiver, including goals, any changes in plan of care and target discharge date.  Patient and caregiver express understanding and are in agreement.  The patient has a target discharge date of 02/17/21.  SW met with patient and spouse in room to provide updates. Pt and spouse aware of AV ABX at discharge. Spouse reports working with Louis [name]) in the past. SW will have him arranged. Spouse concerned PIC line vs MID line. Patient scheduled adjusted to 15/7.Intake improved. No additional questions or concerns, sw will continue to follow up  Dyanne Iha 02/02/2021, 1:14 PM

## 2021-02-02 NOTE — Progress Notes (Signed)
Occupational Therapy Session Note  Patient Details  Name: Louis [name] MRN: [id number] Date of Birth: 1965/09/13  Today's Date: 02/02/2021 OT Individual Time: 8343-7357 OT Individual Time Calculation (min): 20 min + 31 and Today's Date: 02/02/2021 OT Missed Time: 40 Minutes Missed Time Reason: Pain   Short Term Goals: Week 1:  OT Short Term Goal 1 (Week 1): Patient will complete bed mobility and functional transfers with CGA OT Short Term Goal 2 (Week 1): patient will complete bathing and toileting with min A OT Short Term Goal 3 (Week 1): patient will complete dressing tasks with min A using assistive devices with good carryover OT Short Term Goal 4 (Week 1): patient will tolerate standing activities and basic HM for 5-10 minutes with CGA  Skilled Therapeutic Interventions/Progress Updates:    Session 1 ([id number]): Pt received semi-reclined in bed with LPN present for morning medication pass, initially agreeable to therapy. Pt c/o ongoing upper BLE pain since fall yesterday pm. Reports not wanting "to get anyone in trouble", but encourage to cont to let staff knows if pain worsens/continues. Discussed importance of self-monitoring fatigue levels to prevent additional falls and nonpharm pain management such as heat/ice + positioning. Pt interested in trying heat/ice, but confirmed with LPN that 2/2 BLE swelling that this is currently not authorized by MD. Pt declining OOB activity + ADL this date 2/2 pain. Pt missed 40 min of scheduled OT 2/2 pain. Pt left semi-reclined in bed with call bell in reach, and all immediate needs met.    Session 2 [phone]) : Pt received semi-reclined in bed with wife present, cont to c/o R hip pain and states "I just don't feel well" but, agreeable to bed level exercises with encouragement. Session focus on BLE strengthening + activity tolerance in prep for improved ADL/IADL/func mobility performance + decreased caregiver burden. Completed 2x15 bridges, hip  abduction/adduction with level 3 theraband, and ankle pumps. Pt with significant difficulty lifting gluteal surface off of bed. C/o increased R hip with attempts at straight leg lifts so activity ceased.  Pt left semi-reclined in bed with wife present with bed alarm engaged, call bell in reach, and all immediate needs met.    Therapy Documentation Precautions:  Precautions Precautions: Back Precaution Booklet Issued: No Precaution Comments: Pt unable to recall 3/3 back precautions Restrictions Weight Bearing Restrictions: No  Pain: see session notes   ADL: See Care Tool for more details.   Therapy/Group: Individual Therapy  Volanda Napoleon MS, OTR/L  02/02/2021, 6:43 AM

## 2021-02-02 NOTE — Progress Notes (Signed)
Patient slept fairly well. Falling asleep in latter part of shift. Declined prn trazodone at hs. PRN oxycodone given x2 for c/o back pain-partial effects noted.

## 2021-02-02 NOTE — Progress Notes (Signed)
Physical Therapy Session Note  Patient Details  Name: Louis [name] MRN: [id number] Date of Birth: 1966-01-11  Today's Date: 02/02/2021 PT Individual Time:  6144-3154  PT Individual Time: 16 min  Short Term Goals: Week 1:  PT Short Term Goal 1 (Week 1): Pt will ambulate 62ft minA PT Short Term Goal 2 (Week 1): Pt will complete sit to stand CGA PT Short Term Goal 3 (Week 1): Pt will be able to recall 3/3 back precautions PT Short Term Goal 4 (Week 1): Pt will be able to complete bed mobility MinA PT Short Term Goal 5 (Week 1): Pt will initiate stair training  Skilled Therapeutic Interventions/Progress Updates:  Patient supine in bed on entrance to room. Patient alert and relates 12/ 10 pain at knees, hips, and back, that pt relates to fall from previous day. Educated pt that some of pain may be from fall and some may be from  increased activity that pt's body is not used to at present. Educated pt re: long acting opiod pain medication that is scheduled and prn opiod that pt can request every 4 hours. Pt states he also did not sleep well and was up most of night d/t pain. Recommended to pt to maintain schedule on his own for staying on top of add'l prn medication in order to attempt to smooth spikes in pain as well as to improve participation in therapy. Also recommended pt to change bed positioning throughout day as well as to continue to move BLE and lie on sides in order to improve pressure relief to back and keep muscles and joints moving to promote and progress reduced pain with mobility, assist with managing swelling, and promote good circulation. Pt in agreement. Encouraged pt to attempt to participate with afternoon therapy session after rest this morning. Pt left in supine with all needs in reach and wife present.  Missed 44 min of skilled therapy session.   Therapy Documentation Precautions:  Precautions Precautions: Back Precaution Booklet Issued: No Precaution Comments: Pt unable to  recall 3/3 back precautions Restrictions Weight Bearing Restrictions: No  Therapy/Group: Individual Therapy  Loel Dubonnet PT, DPT 02/02/2021, 9:06 AM

## 2021-02-03 DIAGNOSIS — R5381 Other malaise: Secondary | ICD-10-CM | POA: Diagnosis not present

## 2021-02-03 LAB — BASIC METABOLIC PANEL
Anion gap: 8 (ref 5–15)
BUN: <5 mg/dL — ABNORMAL LOW (ref 6–20)
CO2: 26 mmol/L (ref 22–32)
Calcium: 8.2 mg/dL — ABNORMAL LOW (ref 8.9–10.3)
Chloride: 94 mmol/L — ABNORMAL LOW (ref 98–111)
Creatinine, Ser: 0.41 mg/dL — ABNORMAL LOW (ref 0.61–1.24)
GFR, Estimated: >60 mL/min (ref >60)
Glucose, Bld: 218 mg/dL — ABNORMAL HIGH (ref 70–99)
Potassium: 3.5 mmol/L (ref 3.5–5.1)
Sodium: 128 mmol/L — ABNORMAL LOW (ref 135–145)

## 2021-02-03 LAB — GLUCOSE, CAPILLARY
Glucose-Capillary: 214 mg/dL — ABNORMAL HIGH (ref 70–99)
Glucose-Capillary: 150 mg/dL — ABNORMAL HIGH (ref 70–99)
Glucose-Capillary: 166 mg/dL — ABNORMAL HIGH (ref 70–99)

## 2021-02-03 LAB — C-REACTIVE PROTEIN: CRP: 6.9 mg/dL — ABNORMAL HIGH (ref <1.0)

## 2021-02-03 LAB — VITAMIN B1: Vitamin B1 (Thiamine): 135.9 nmol/L (ref 66.5–200.0)

## 2021-02-03 MED ORDER — SODIUM CHLORIDE 0.9 % IV SOLN
INTRAVENOUS | Status: DC | PRN
  Administered 2021-02-03 – 2021-02-15 (×6): 250 mL via INTRAVENOUS
  Administered 2021-02-15: 150 mL via INTRAVENOUS
  Administered 2021-02-16 – 2021-02-18 (×3): 250 mL via INTRAVENOUS

## 2021-02-03 MED ORDER — KETOROLAC TROMETHAMINE 60 MG/2ML IM SOLN
30.0000 mg | Freq: Once | INTRAMUSCULAR | Status: AC
  Administered 2021-02-03: 30 mg via INTRAMUSCULAR
  Filled 2021-02-03: qty 2

## 2021-02-03 MED ORDER — INSULIN GLARGINE-YFGN 100 UNIT/ML ~~LOC~~ SOLN
15.0000 [IU] | Freq: Every day | SUBCUTANEOUS | Status: DC
  Administered 2021-02-04 – 2021-02-20 (×17): 15 [IU] via SUBCUTANEOUS
  Filled 2021-02-03 (×17): qty 0.15

## 2021-02-03 NOTE — Progress Notes (Signed)
Physical Therapy Session Note  Patient Details  Name: Louis [name] MRN: [id number] Date of Birth: [birth year]  Today's Date: 02/03/2021 PT Individual Time: 6816-6196 PT Individual Time Calculation (min): 29 min   Short Term Goals: Week 1:  PT Short Term Goal 1 (Week 1): Pt will ambulate 44ft minA PT Short Term Goal 2 (Week 1): Pt will complete sit to stand CGA PT Short Term Goal 3 (Week 1): Pt will be able to recall 3/3 back precautions PT Short Term Goal 4 (Week 1): Pt will be able to complete bed mobility MinA PT Short Term Goal 5 (Week 1): Pt will initiate stair training   Skilled Therapeutic Interventions/Progress Updates:   Pt received supine in bed and agreeable to PT at bed level.   PT instructed pt in supine therex for BLE strengthening.  SAQ x 10 Hip abduction with level 2 tband x 12 Ankle PF/DF x 15  SLR x 8 with AAROM due to weakness  Hip/knee flexion/extension with manual resistance x 10  Isometric hip adduction x 12  Cues from PT to improved ROM as tolerated as well as decrease speed of eccentric movements throughout supine therex.    Pt left in bed with call bell in reach and all needs met.      Therapy Documentation Precautions:  Precautions Precautions: Back Precaution Booklet Issued: No Precaution Comments: Pt unable to recall 3/3 back precautions Restrictions Weight Bearing Restrictions: No    Vital Signs: Therapy Vitals Temp: 98.5 F (36.9 C) Temp Source: Oral Pulse Rate: 88 Resp: 18 BP: 126/79 Patient Position (if appropriate): Lying Oxygen Therapy SpO2: 100 % O2 Device: Room Air Pain:  HA 8/10. Anterior. Pt repositioned.     Therapy/Group: Individual Therapy  Lorie Phenix 02/03/2021, 6:10 PM

## 2021-02-03 NOTE — Progress Notes (Signed)
Occupational Therapy Session Note  Patient Details  Name: Louis [name] MRN: [id number] Date of Birth: [birth year]  Today's Date: 02/03/2021 OT Individual Time: 1003-1015 OT Individual Time Calculation (min): 12 min  and Today's Date: 02/03/2021 OT Missed Time: 63 Minutes Missed Time Reason: Pain   Short Term Goals: Week 1:  OT Short Term Goal 1 (Week 1): Patient will complete bed mobility and functional transfers with CGA OT Short Term Goal 2 (Week 1): patient will complete bathing and toileting with min A OT Short Term Goal 3 (Week 1): patient will complete dressing tasks with min A using assistive devices with good carryover OT Short Term Goal 4 (Week 1): patient will tolerate standing activities and basic HM for 5-10 minutes with CGA  Skilled Therapeutic Interventions/Progress Updates:    Pt received semi-reclined in bed with wife present. C/o migraine "my head is going to explode." RN then present to administer pain med. Pt relates "turning around" in bed last night triggered feelings of dizziness/nausea. Confirmed with scheduling to get pt scheduled for vestibular eval. Pt declines ADL, bed level therex, and OOB activity at this time 2/2 pain in BLE/back. Did not rate. Assisted with repositioning in bed. Will attempt to see later if schedule allows. Pt left semi-reclined in bed with wife present ,  call bell in reach, and all immediate needs met.    Therapy Documentation Precautions:  Precautions Precautions: Back Precaution Booklet Issued: No Precaution Comments: Pt unable to recall 3/3 back precautions Restrictions Weight Bearing Restrictions: No  Pain: see session note   ADL: See Care Tool for more details.   Therapy/Group: Individual Therapy  Volanda Napoleon MS, OTR/L  02/03/2021, 6:49 AM

## 2021-02-03 NOTE — Progress Notes (Signed)
PROGRESS NOTE   Subjective/Complaints:  No issues overnite , has had migraine this am , has had these at home , his usual area behind the left orbital area , took a "pill at home"  ROS: Patient denies CP, SOB, N/V/D  Objective:   No results found. No results for input(s): WBC, HGB, HCT, PLT in the last 72 hours. Recent Labs    02/03/21 0608  NA 128*  K 3.5  CL 94*  CO2 26  GLUCOSE 218*  BUN <5*  CREATININE 0.41*  CALCIUM 8.2*     Intake/Output Summary (Last 24 hours) at 02/03/2021 0818 Last data filed at 02/03/2021 6073 Gross per 24 hour  Intake 1640 ml  Output 2150 ml  Net -510 ml      Pressure Injury 01/28/21 Coccyx Left Stage 2 -  Partial thickness loss of dermis presenting as a shallow open injury with a red, pink wound bed without slough. pink, moist (Active)  01/28/21 1845  Location: Coccyx  Location Orientation: Left  Staging: Stage 2 -  Partial thickness loss of dermis presenting as a shallow open injury with a red, pink wound bed without slough.  Wound Description (Comments): pink, moist  Present on Admission: Yes    Physical Exam: Vital Signs Blood pressure 138/84, pulse 90, temperature 98.2 F (36.8 C), resp. rate 18, height 5\' 11"  (1.803 m), weight 102.7 kg, SpO2 100 %.  General: No acute distress Mood and affect are appropriate Heart: Regular rate and rhythm no rubs murmurs or extra sounds Lungs: Clear to auscultation, breathing unlabored, no rales or wheezes Abdomen: Positive bowel sounds, soft nontender to palpation, nondistended Extremities: No clubbing, cyanosis, or edema Skin: No evidence of breakdown, no evidence of rash    Neuro:  Alert and oriented x 3. Normal insight and awareness. Intact Memory. Normal language and speech. Cranial nerve exam unremarkable. UE 4/5 prox to distal. BLE 3/5 prox  to 4/5 distal. No focal sensory abnl.  Musculoskeletal: back/pelvis tender to palpation  and with AROM/PROM    Assessment/Plan: 1. Functional deficits which require 3+ hours per day of interdisciplinary therapy in a comprehensive inpatient rehab setting. Physiatrist is providing close team supervision and 24 hour management of active medical problems listed below. Physiatrist and rehab team continue to assess barriers to discharge/monitor patient progress toward functional and medical goals  Care Tool:  Bathing  Bathing activity did not occur: Refused           Bathing assist       Upper Body Dressing/Undressing Upper body dressing   What is the patient wearing?: Pull over shirt    Upper body assist Assist Level: Maximal Assistance - Patient 25 - 49%    Lower Body Dressing/Undressing Lower body dressing      What is the patient wearing?: Pants, Underwear/pull up     Lower body assist Assist for lower body dressing: Total Assistance - Patient < 25%     Toileting Toileting    Toileting assist Assist for toileting: Independent with assistive device Assistive Device Comment: Urinal   Transfers Chair/bed transfer  Transfers assist     Chair/bed transfer assist level: Minimal Assistance - Patient >  75% (stand pivot with the RW)     Locomotion Ambulation   Ambulation assist      Assist level: 2 helpers Assistive device: Walker-rolling Max distance: 12-15'   Walk 10 feet activity   Assist  Walk 10 feet activity did not occur: Safety/medical concerns    Assistive device: Walker-rolling   Walk 50 feet activity   Assist Walk 50 feet with 2 turns activity did not occur: Safety/medical concerns  Assist level: Total Assistance - Patient < 25%      Walk 150 feet activity   Assist Walk 150 feet activity did not occur: Safety/medical concerns         Walk 10 feet on uneven surface  activity   Assist Walk 10 feet on uneven surfaces activity did not occur: Safety/medical concerns         Wheelchair     Assist Will  patient use wheelchair at discharge?: No Type of Wheelchair: Manual (indicated per PT note)    Wheelchair assist level: Contact Guard/Touching assist Max wheelchair distance: 150    Wheelchair 50 feet with 2 turns activity    Assist        Assist Level: Contact Guard/Touching assist   Wheelchair 150 feet activity     Assist      Assist Level: Contact Guard/Touching assist   Blood pressure 138/84, pulse 90, temperature 98.2 F (36.8 C), resp. rate 18, height 5\' 11"  (1.803 m), weight 102.7 kg, SpO2 100 %.  Medical Problem List and Plan: 1.  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              -patient may shower             -ELOS/Goals: 12 to 15 days, modified independent goals with PT and OT  -Continue CIR therapies including PT, OT team conf today  2.  Antithrombotics: -DVT/anticoagulation:  Pharmaceutical: Lovenox- don't anticipate need at discharge              -antiplatelet therapy: Baby aspirin daily 3. Pain Management: OxyContin 10 mg every 12 hours with oxycodone 5 mg every 4 hours as needed             -Robaxin 500 mg every 6 hours as needed for spasms             -Voltaren gel for topical relief             -Added K pad for muscle spasm and low back pain             -Tylenol for mild pain Will give toradol x 1 for migraine  4. Mood: Team to provide ego support             -continue Effexor XR 75 mg daily             -antipsychotic agents: N/A, had been haldol before 5. Neuropsych: This patient is capable of making decisions on his own behalf. -Patient's mental status appears to be improving  6. Skin/Wound Care: Maximize nutritional intake, local skin care as needed 7. Fluids/Electrolytes/Severe Protein malutrition: Patient now is on regular diet.  Encourage p.o. intake.          eating 100% meals , fluid intake variable , able to take pills po,  8.  Sepsis secondary to discitis and epidural  abscess/bacteremia             -IV vancomycin and sertraline until today, then  to IV vancomycin alone for 6 weeks             -Monitor clinically and with regular labs 9.  Uncontrolled type 2 diabetes             -Lantus insulin 5 units with sliding scale insulin coverage--increased to 10 units 8/6             -improving control  CBG (last 3)  Recent Labs    02/02/21 1722 02/02/21 2102 02/03/21 0618  GLUCAP 223* 268* 214*   Still elevated off TF , increase Semglee to 15U 10.  Chronic diastolic congestive heart failure             -Monitor daily weights             -Anasarca most likely due to nutritional status as opposed to cardiac             -Recent 2D echo demonstrated no wall abnormalities   Filed Weights   01/31/21 0400 02/02/21 0630 02/03/21 0529  Weight: 98.3 kg 102.9 kg 102.7 kg  Weight up but I/O show continued diuresis 11.  Hyponatremia- off Free H20 VT should slowly correct              -Hypokalemia, likely due to lasix will supplement K, x 1 d ,improved off lasix  12. Anemia d/t illness 13. Thrombocytosis    LOS: 6 days A FACE TO FACE EVALUATION WAS PERFORMED  Erick Colace 02/03/2021, 8:18 AM

## 2021-02-03 NOTE — Progress Notes (Signed)
Physical Therapy Session Note  Patient Details  Name: Louis [name] MRN: [id number] Date of Birth: 07/09/1965  Today's Date: 02/03/2021 PT Individual Time:  -    PT Amount of Missed Time (min): 60 Minutes PT Missed Treatment Reason: Pain;Patient fatigue  Short Term Goals: Week 1:  PT Short Term Goal 1 (Week 1): Pt will ambulate 70ft minA PT Short Term Goal 2 (Week 1): Pt will complete sit to stand CGA PT Short Term Goal 3 (Week 1): Pt will be able to recall 3/3 back precautions PT Short Term Goal 4 (Week 1): Pt will be able to complete bed mobility MinA PT Short Term Goal 5 (Week 1): Pt will initiate stair training  Skilled Therapeutic Interventions/Progress Updates:    Pt in bed upon entry with RN administering medications. Pt states migraine pain 12/10 and back pain 12/10 radiating down LLE.  Pt requesting to rest and allow medications to kick. Pt given schedule for following sessions stating "hopefully I will be better for our next session. Come back and maybe we will do an hour later."   Therapy Documentation Precautions:  Precautions Precautions: Back Precaution Booklet Issued: No Precaution Comments: Pt unable to recall 3/3 back precautions Restrictions Weight Bearing Restrictions: No General: PT Amount of Missed Time (min): 60 Minutes PT Missed Treatment Reason: Pain;Patient fatigue  Pain: Pain Assessment Pain Scale: 0-10 Pain Score: 10-Worst pain ever Pain Location: Back Pain Orientation: Mid;Lower Pain Descriptors / Indicators: Sharp;Radiating 2nd Pain Site Pain Score: 10 Pain Type: Acute pain Pain Location: Head Pain Orientation: Left;Right Pain Descriptors / Indicators:  (Migraine) Pain Onset: On-going  Therapy/Group: Individual Therapy  [name], SPT  02/03/2021, 8:20 AM

## 2021-02-04 ENCOUNTER — Inpatient Hospital Stay: Payer: Self-pay

## 2021-02-04 DIAGNOSIS — I5032 Chronic diastolic (congestive) heart failure: Secondary | ICD-10-CM | POA: Diagnosis not present

## 2021-02-04 DIAGNOSIS — R5381 Other malaise: Secondary | ICD-10-CM | POA: Diagnosis not present

## 2021-02-04 DIAGNOSIS — E1169 Type 2 diabetes mellitus with other specified complication: Secondary | ICD-10-CM | POA: Diagnosis not present

## 2021-02-04 DIAGNOSIS — G062 Extradural and subdural abscess, unspecified: Secondary | ICD-10-CM | POA: Diagnosis not present

## 2021-02-04 LAB — GLUCOSE, CAPILLARY
Glucose-Capillary: 294 mg/dL — ABNORMAL HIGH (ref 70–99)
Glucose-Capillary: 222 mg/dL — ABNORMAL HIGH (ref 70–99)
Glucose-Capillary: 153 mg/dL — ABNORMAL HIGH (ref 70–99)
Glucose-Capillary: 162 mg/dL — ABNORMAL HIGH (ref 70–99)

## 2021-02-04 MED ORDER — SODIUM CHLORIDE 0.9% FLUSH
10.0000 mL | Freq: Two times a day (BID) | INTRAVENOUS | Status: DC
  Administered 2021-02-04 – 2021-02-10 (×13): 10 mL
  Administered 2021-02-11: 20 mL
  Administered 2021-02-11 – 2021-02-13 (×5): 10 mL
  Administered 2021-02-14: 20 mL
  Administered 2021-02-15 – 2021-02-20 (×6): 10 mL

## 2021-02-04 MED ORDER — CHLORHEXIDINE GLUCONATE CLOTH 2 % EX PADS
6.0000 | MEDICATED_PAD | Freq: Every day | CUTANEOUS | Status: DC
  Administered 2021-02-05 – 2021-02-20 (×17): 6 via TOPICAL

## 2021-02-04 MED ORDER — KETOROLAC TROMETHAMINE 60 MG/2ML IM SOLN
30.0000 mg | Freq: Every day | INTRAMUSCULAR | Status: AC | PRN
  Administered 2021-02-04: 30 mg via INTRAMUSCULAR
  Filled 2021-02-04 (×2): qty 2

## 2021-02-04 MED ORDER — SODIUM CHLORIDE 0.9% FLUSH
10.0000 mL | INTRAVENOUS | Status: DC | PRN
  Administered 2021-02-10 – 2021-02-18 (×3): 10 mL

## 2021-02-04 NOTE — Progress Notes (Addendum)
PROGRESS NOTE   Subjective/Complaints:  Complains of substantial back pain this morning with radiation into both sides, throbbing. Had migraine yesterday for which he received tramadol. The tramadol really helped his back too. Back pain was so significant this morning that it was radiating thru to his chest. Therapy reported pain was inhibiting his mobility today   ROS: Patient denies fever, rash, sore throat, blurred vision, nausea, vomiting, diarrhea, cough, shortness of breath  or mood change.   Objective:   No results found. No results for input(s): WBC, HGB, HCT, PLT in the last 72 hours. Recent Labs    02/03/21 0608  NA 128*  K 3.5  CL 94*  CO2 26  GLUCOSE 218*  BUN <5*  CREATININE 0.41*  CALCIUM 8.2*    Intake/Output Summary (Last 24 hours) at 02/04/2021 1042 Last data filed at 02/03/2021 2150 Gross per 24 hour  Intake 600 ml  Output 925 ml  Net -325 ml     Pressure Injury 01/28/21 Coccyx Left Stage 2 -  Partial thickness loss of dermis presenting as a shallow open injury with a red, pink wound bed without slough. pink, moist (Active)  01/28/21 1845  Location: Coccyx  Location Orientation: Left  Staging: Stage 2 -  Partial thickness loss of dermis presenting as a shallow open injury with a red, pink wound bed without slough.  Wound Description (Comments): pink, moist  Present on Admission: Yes    Physical Exam: Vital Signs Blood pressure (!) 160/91, pulse 93, temperature 98.4 F (36.9 C), temperature source Oral, resp. rate 18, height 5\' 11"  (1.803 m), weight 102.7 kg, SpO2 100 %.  Constitutional: No distress . Vital signs reviewed. HEENT: NCAT, EOMI, oral membranes moist Neck: supple Cardiovascular: RRR without murmur. No JVD    Respiratory/Chest: CTA Bilaterally without wheezes or rales. Normal effort    GI/Abdomen: BS +, non-tender, non-distended Ext: no clubbing, cyanosis, or edema Psych:  pleasant and cooperative  Skin: No evidence of breakdown, no evidence of rash Neuro:  Alert and oriented x 3. Normal insight and awareness. Intact Memory. Normal language and speech. Cranial nerve exam unremarkable. UE 4/5 prox to distal. BLE 3/5 prox  to 4/5 distal. No focal sensory abnl.  Musculoskeletal: low back/pelvis tender to palpation and with AROM/PROM, mid back pain, too today.    Assessment/Plan: 1. Functional deficits which require 3+ hours per day of interdisciplinary therapy in a comprehensive inpatient rehab setting. Physiatrist is providing close team supervision and 24 hour management of active medical problems listed below. Physiatrist and rehab team continue to assess barriers to discharge/monitor patient progress toward functional and medical goals  Care Tool:  Bathing  Bathing activity did not occur: Refused           Bathing assist       Upper Body Dressing/Undressing Upper body dressing   What is the patient wearing?: Pull over shirt    Upper body assist Assist Level: Maximal Assistance - Patient 25 - 49%    Lower Body Dressing/Undressing Lower body dressing      What is the patient wearing?: Pants, Underwear/pull up     Lower body assist Assist for lower body dressing:  Total Assistance - Patient < 25%     Toileting Toileting    Toileting assist Assist for toileting: Independent with assistive device Assistive Device Comment: Urinal   Transfers Chair/bed transfer  Transfers assist     Chair/bed transfer assist level: Minimal Assistance - Patient > 75% (stand pivot with the RW)     Locomotion Ambulation   Ambulation assist      Assist level: 2 helpers Assistive device: Walker-rolling Max distance: 12-15'   Walk 10 feet activity   Assist  Walk 10 feet activity did not occur: Safety/medical concerns    Assistive device: Walker-rolling   Walk 50 feet activity   Assist Walk 50 feet with 2 turns activity did not occur:  Safety/medical concerns  Assist level: Total Assistance - Patient < 25%      Walk 150 feet activity   Assist Walk 150 feet activity did not occur: Safety/medical concerns         Walk 10 feet on uneven surface  activity   Assist Walk 10 feet on uneven surfaces activity did not occur: Safety/medical concerns         Wheelchair     Assist Will patient use wheelchair at discharge?: No Type of Wheelchair: Manual (indicated per PT note)    Wheelchair assist level: Contact Guard/Touching assist Max wheelchair distance: 150    Wheelchair 50 feet with 2 turns activity    Assist        Assist Level: Contact Guard/Touching assist   Wheelchair 150 feet activity     Assist      Assist Level: Contact Guard/Touching assist   Blood pressure (!) 160/91, pulse 93, temperature 98.4 F (36.9 C), temperature source Oral, resp. rate 18, height 5\' 11"  (1.803 m), weight 102.7 kg, SpO2 100 %.  Medical Problem List and Plan: 1.  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              -patient may shower             -ELOS/Goals: 12 to 15 days, modified independent goals with PT and OT  -Continue CIR therapies including PT, OT  2.  Antithrombotics: -DVT/anticoagulation:  Pharmaceutical: Lovenox- don't anticipate need at discharge              -antiplatelet therapy: Baby aspirin daily 3. Pain Management: OxyContin 10 mg every 12 hours with oxycodone 5 mg every 4 hours as needed             -Robaxin 500 mg every 6 hours as needed for spasms             -Voltaren gel for topical relief             -Added K pad for muscle spasm and low back pain             -Tylenol for mild pain -will allow toradol IM daily prn x2 days given relief -also needs to receive the Kpad which was ordered previously.  4. Mood: Team to provide ego support             -continue Effexor XR 75 mg daily             -antipsychotic agents: N/A,  had been haldol before 5. Neuropsych: This patient is capable of making decisions on his own behalf. -Patient's mental status appears to be improving  6. Skin/Wound Care: Maximize nutritional intake, local skin care as needed 7. Fluids/Electrolytes/Severe  Protein malutrition: Patient now is on regular diet.  Encourage p.o. intake.          eating 100% meals  8.  Sepsis secondary to discitis and epidural abscess/bacteremia             -IV vancomycin through 9/15             -Monitor clinically and with regular labs 9.  Uncontrolled type 2 diabetes             -Lantus insulin 5 units with sliding scale insulin coverage--increased to 10 units 8/6             -improving control  CBG (last 3)  Recent Labs    02/03/21 1137 02/03/21 1705 02/04/21 0623  GLUCAP 150* 166* 294*  Still elevated off TF , increased Semglee to 15U effective this morning 8/12--observe 10.  Chronic diastolic congestive heart failure             -Monitor daily weights             -Anasarca most likely due to nutritional status as opposed to cardiac             -Recent 2D echo demonstrated no wall abnormalities   Filed Weights   01/31/21 0400 02/02/21 0630 02/03/21 0529  Weight: 98.3 kg 102.9 kg 102.7 kg  Weight up but I/O show continued diuresis 11.  Hyponatremia- off Free H20 VT should slowly correct              -Hypokalemia, likely due to lasix will supplement K, x 1 d ,improved off lasix  12. Anemia d/t illness 13. Thrombocytosis    LOS: 7 days A FACE TO FACE EVALUATION WAS PERFORMED  Ranelle Oyster 02/04/2021, 10:42 AM

## 2021-02-04 NOTE — Progress Notes (Signed)
Pt slept fairly well. Medicated x1 with prn oxycodone for c/o bilateral leg pain and headache-partial effects noted. Refused foot pumps, juven and ensures.

## 2021-02-04 NOTE — Progress Notes (Signed)
   02/04/21 0649  Vitals  Temp 98.4 F (36.9 C)  Temp Source Oral  BP (!) 160/91  MAP (mmHg) 113  BP Method Automatic  Pulse Rate 93  Pulse Rate Source Monitor  Resp 18  MEWS COLOR  MEWS Score Color Green  Oxygen Therapy  SpO2 100 %  O2 Device Room Air  MEWS Score  MEWS Temp 0  MEWS Systolic 0  MEWS Pulse 0  MEWS RR 0  MEWS LOC 0  MEWS Score 0  Pt reports shortness of breath and chest tightness. Dan-PA made aware and MD to followup this am. Pt in no distress, reports pain to back, however frequent complaint for patient, being currently managed with scheduled and prn oxycodone. Will report to oncoming shift for followup

## 2021-02-04 NOTE — Progress Notes (Signed)
Peripherally Inserted Central Catheter Placement  The IV Nurse has discussed with the patient and/or persons authorized to consent for the patient, the purpose of this procedure and the potential benefits and risks involved with this procedure.  The benefits include less needle sticks, lab draws from the catheter, and the patient may be discharged home with the catheter. Risks include, but not limited to, infection, bleeding, blood clot (thrombus formation), and puncture of an artery; nerve damage and irregular heartbeat and possibility to perform a PICC exchange if needed/ordered by physician.  Alternatives to this procedure were also discussed.  Bard Power PICC patient education guide, fact sheet on infection prevention and patient information card has been provided to patient /or left at bedside.    PICC Placement Documentation  PICC Single Lumen 02/04/21 Left Brachial 46 cm 1 cm (Active)  Indication for Insertion or Continuance of Line Prolonged intravenous therapies 02/04/21 1830  Exposed Catheter (cm) 1 cm 02/04/21 1830  Site Assessment Clean;Dry;Intact 02/04/21 1830  Line Status Flushed;Saline locked;Blood return noted 02/04/21 1830  Dressing Type Transparent;Securing device 02/04/21 1830  Dressing Status Clean;Dry;Intact 02/04/21 1830  Antimicrobial disc in place? Yes 02/04/21 1830  Safety Lock Not Applicable 02/04/21 1830  Dressing Intervention New dressing;Other (Comment) 02/04/21 1830  Dressing Change Due 02/11/21 02/04/21 1830       Annett Fabian 02/04/2021, 6:54 PM

## 2021-02-04 NOTE — Progress Notes (Signed)
Occupational Therapy Session Note  Patient Details  Name: Louis [name] MRN: [id number] Date of Birth: 1966-03-28  Today's Date: 02/04/2021 OT Individual Time: 1435-1530 OT Individual Time Calculation (min): 55 min    Short Term Goals: Week 1:  OT Short Term Goal 1 (Week 1): Patient will complete bed mobility and functional transfers with CGA OT Short Term Goal 2 (Week 1): patient will complete bathing and toileting with min A OT Short Term Goal 3 (Week 1): patient will complete dressing tasks with min A using assistive devices with good carryover OT Short Term Goal 4 (Week 1): patient will tolerate standing activities and basic HM for 5-10 minutes with CGA  Skilled Therapeutic Interventions/Progress Updates:    Pt seen for vestibular eval and treatment.  Pt with increased dizziness reported with positional changes including supine to sit and sit to supine.  He was currently in the recliner with no reports of dizziness sitting statically but stated he was so dizzy earlier that he could not even get back to the bed trying to scoot over when nursing was assisting.  Had him complete sit to stand from the recliner with min assist using the RW and pivot to the bed.  No report of dizziness.  When he transitioned to supine he needed mod assist and dizziness was noted with eyes rolling back in his head briefly but no nystagmus was seen.  This lasted for a few seconds before he stated that he felt OK.  In supine with HOB elevated therapist tested horizontal canals bilaterally with no nystagmus or dizziness noted.  Proceeded with Gilberto Better in bed for anterior/posterior canals.  He exhibited positive rotational nystagmus with head turn to the left with right head turn being negative.  Rotational eye movement lasted for less than 15 seconds.  Had him complete Epley Maneuver with mod to max assist to achieve most positions for treatment of left ant/post BPPV.  Increased dizziness throughout positional changes with  the most being in sidelying position with head looking at the floor.  He reported no dizziness when completing last step of the maneuver with transition to sitting from sidelying.  Upon laying back down in supine, no dizziness was reported, however he did not want to re-test the left ear at this time to see if the nystagmus was resolved.  Will continue to monitor in therapy and complete further Epley Maneuvers for left ant/post BPPV if needed.  Pt was left with spouse present and with the call button and phone in reach.    Therapy Documentation Precautions:  Precautions Precautions: Back Precaution Booklet Issued: No Precaution Comments: Pt unable to recall 3/3 back precautions Restrictions Weight Bearing Restrictions: No  Pain: Pain Assessment Pain Scale: Faces Faces Pain Scale: Hurts little more Pain Type: Acute pain Pain Location: Back Pain Orientation: Lower Pain Descriptors / Indicators: Discomfort Pain Onset: With Activity Pain Intervention(s): Repositioned;Emotional support ADL: See Care Tool Section for some details of mobility and selfcare  Therapy/Group: Individual Therapy  Louis [name] OTR/L 02/04/2021, 5:04 PM

## 2021-02-04 NOTE — Progress Notes (Signed)
Physical Therapy Session Note  Patient Details  Name: Louis [name] MRN: [id number] Date of Birth: Aug 09, 1965  Today's Date: 02/04/2021 PT Individual Time: 2836-6294 PT Individual Time Calculation (min): 45 min   Short Term Goals: Week 1:  PT Short Term Goal 1 (Week 1): Pt will ambulate 44ft minA PT Short Term Goal 2 (Week 1): Pt will complete sit to stand CGA PT Short Term Goal 3 (Week 1): Pt will be able to recall 3/3 back precautions PT Short Term Goal 4 (Week 1): Pt will be able to complete bed mobility MinA PT Short Term Goal 5 (Week 1): Pt will initiate stair training  Skilled Therapeutic Interventions/Progress Updates:  Patient supine in bed on entrance to room. Patient alert and agreeable to PT session. Continues to complain of 8/ 10 pain in back and L hip  at start of and during session.   MD entering room for morning rounding. PT assists pt in relating all issues just related to this PT in order for MD to have a clearer picture of pt's pain. Pt does relate that the shot received yesterday of Toradol has done the most to relive pt's pain and he was actually pain free for 4 hours. MD to look into more of a schedule that will will work with pt's other current medications.   Pt's IV equipment is hooked up but is not turned on. RN notified re: pt not currently receiving abx medication. Informed by RN that IV has been leaking and IV team has been called to come and check lines to stop leak prior to re-starting the IV treatment again.   Therapeutic Activity: Bed Mobility: Patient performed supine --> sit with Mod A for upper body. Follows vc/ tc for log roll technique to reach sidelying. Upon sitting requests to lie down d/t dizziness. Therapeutic rest break prior to 2nd attempt where upon sitting lt leans forward into therapist's shoulder and wife's arm and adamant not to lie down again. Pt allows dizziness to subside, then willing to attempt standing.  Transfers: Patient performed STS and  SPVT transfer bed --> recliner with Min A using RW. Provided verbal cues for maintaining strong BLE knee extension during stance phases. Pt initially requesting to change shorts d/r difficulties with holding urinal through pain spikes as well as leaking IV. With mobility pt's pain level has slightly decreased but pt requests time to prepare to stand again. After time, pt requests not to stand again as he does not want his pain to spike again. Reminded pt of need to change shorts and pt refusing to stand again d/t current relief from recent pain levels.   Patient semi reclined  in receliner at end of session with brakes locked, seat alarm set, and all needs within reach.     Therapy Documentation Precautions:  Precautions Precautions: Back Precaution Booklet Issued: No Precaution Comments: Pt unable to recall 3/3 back precautions Restrictions Weight Bearing Restrictions: No  Therapy/Group: Individual Therapy  Loel Dubonnet PT, DPT 02/04/2021, 5:50 PM

## 2021-02-04 NOTE — Progress Notes (Addendum)
VAST consulted to obtain IV access. Upon assessment of patient's chart, noted Dr. Rosalyn Charters note from this am included: "8.  Sepsis secondary to discitis and epidural abscess/bacteremia             -IV vancomycin and sertraline until today, then to IV vancomycin alone for 6 weeks             -Monitor clinically and with regular labs" Reached out to Dr. Wynn Banker, MD regarding above and requesting PICC placement order to allow for vein preservation and decreased risk of infection d/t multiple sticks.  Included patient's nurse, Apolinar Junes in [place] and spoke with him.  Awaiting response from MD.  1430 Dr. Riley Kill to place order for PICC placement.

## 2021-02-05 DIAGNOSIS — G062 Extradural and subdural abscess, unspecified: Secondary | ICD-10-CM | POA: Diagnosis not present

## 2021-02-05 DIAGNOSIS — E871 Hypo-osmolality and hyponatremia: Secondary | ICD-10-CM | POA: Diagnosis not present

## 2021-02-05 DIAGNOSIS — I5032 Chronic diastolic (congestive) heart failure: Secondary | ICD-10-CM

## 2021-02-05 DIAGNOSIS — R5381 Other malaise: Secondary | ICD-10-CM | POA: Diagnosis not present

## 2021-02-05 DIAGNOSIS — E1165 Type 2 diabetes mellitus with hyperglycemia: Secondary | ICD-10-CM

## 2021-02-05 DIAGNOSIS — M545 Low back pain, unspecified: Secondary | ICD-10-CM

## 2021-02-05 DIAGNOSIS — D75839 Thrombocytosis, unspecified: Secondary | ICD-10-CM

## 2021-02-05 LAB — GLUCOSE, CAPILLARY
Glucose-Capillary: 158 mg/dL — ABNORMAL HIGH (ref 70–99)
Glucose-Capillary: 152 mg/dL — ABNORMAL HIGH (ref 70–99)
Glucose-Capillary: 224 mg/dL — ABNORMAL HIGH (ref 70–99)
Glucose-Capillary: 209 mg/dL — ABNORMAL HIGH (ref 70–99)

## 2021-02-05 MED ORDER — METHOCARBAMOL 500 MG PO TABS
500.0000 mg | ORAL_TABLET | Freq: Four times a day (QID) | ORAL | Status: DC | PRN
  Administered 2021-02-05 – 2021-02-16 (×7): 500 mg via ORAL
  Filled 2021-02-05 (×7): qty 1

## 2021-02-05 NOTE — Plan of Care (Signed)
  Problem: RH PAIN MANAGEMENT Goal: RH STG PAIN MANAGED AT OR BELOW PT'S PAIN GOAL Description: Pain lss than 4 Outcome: Not Progressing

## 2021-02-05 NOTE — Progress Notes (Signed)
Physical Therapy Session Note  Patient Details  Name: Louis [name] MRN: [id number] Date of Birth: 03/20/1966  Today's Date: 02/05/2021 PT Individual Time: 1683-7290 PT Individual Time Calculation (min): 29 min   Short Term Goals: Week 1:  PT Short Term Goal 1 (Week 1): Pt will ambulate 33ft minA PT Short Term Goal 2 (Week 1): Pt will complete sit to stand CGA PT Short Term Goal 3 (Week 1): Pt will be able to recall 3/3 back precautions PT Short Term Goal 4 (Week 1): Pt will be able to complete bed mobility MinA PT Short Term Goal 5 (Week 1): Pt will initiate stair training  Skilled Therapeutic Interventions/Progress Updates:  Pt in bed with and agreeable to therapy. Pt reports his pain is about the same 8/10 in the low back and bilateral hips. Pt states he was throwing up with AM session, however, feeling a little better currently.  MinA for log roll technique, MaxA for BLE transition and terminal trunk transition within back precautions. Pt states no dizziness. Pt able to tolerate sitting EOB for >1 minute before impulsively laying back to supine 2/2 increased low back pain. ModA facilitation and management of BLE EOB>supine. +2 assist for manual repositioning towards HOB.   Exercises in bed BLE: SAQ x10  Ankle pumps x15  AROM Hip ABD/ADD - AAROM LLE x10  Heel slides x10  Pt required frequent encouragement and attention to task 2/2 pain.  Pt called for pain medicine during session with RN in room to administer. Pt repositioned for maximized comfort.   In bed with HOB/legs elevated. Call bell within reach and all needs met.   Therapy Documentation Precautions:  Precautions Precautions: Back Precaution Booklet Issued: No Precaution Comments: Pt unable to recall 3/3 back precautions Restrictions Weight Bearing Restrictions: No  Pain: Pain Assessment Pain Scale: 0-10 Pain Score: 8  Faces Pain Scale: Hurts little more Pain Type: Acute pain;Surgical pain Pain Location: Back Pain  Orientation: Mid;Lower Pain Descriptors / Indicators: Sharp;Shooting Pain Onset: On-going Pain Intervention(s): RN made aware;Medication (See eMAR);Repositioned  Therapy/Group: Individual Therapy  Leoni Goodness, SPT  02/05/2021, 5:50 PM

## 2021-02-05 NOTE — Progress Notes (Signed)
Pharmacy Antibiotic Note  Louis [name] is a 55 y.o. male admitted on 01/05/21 with epidural abscess with MRSA bacteremia/lumbar osteomyelitis for Vancomycin.  Transferred to Rehab on 01/28/21.   Last vancomycin levels were collected around 8/9 AM dose. Timed and collected appropriately.    Vancomycin peak = 27 mcg/ml.    Vancomycin trough = 14 mcg/ml.    AUC = 492, remains in goal range of 400-550  Serum creatinine continues to be stable, ~0.4, last checked 8/11. Dose still appropriate. Next levels planned for Monday.   Plan: Continue Vancomycin 1500 mg q 12 hours Vancomycin planned thru 03/10/21 Will plan to check Vanc levels weekly or sooner if needed. Monitor renal function at least q 72 hours Follow clinical progress.    Height: 5\' 11"  (180.3 cm) Weight: 102.7 kg (226 lb 6.6 oz) IBW/kg (Calculated) : 75.3  Temp (24hrs), Avg:98.4 F (36.9 C), Min:97.9 F (36.6 C), Max:98.7 F (37.1 C)  Recent Labs  Lab 01/31/21 0603 02/01/21 0700 02/01/21 1036 02/03/21 0608  CREATININE 0.44*  --   --  0.41*  VANCOTROUGH  --  14*  --   --   VANCOPEAK  --   --  27*  --      Estimated Creatinine Clearance: 124.4 mL/min (A) (by C-G formula based on SCr of 0.41 mg/dL (L)).    No Known Allergies  Cubicin PTA >> 7/14 (initial plan through 8/10, last OPAT 7/13); 7/20 >>7/21 Ceftaroline 7/20 >> 8/5 Vanc 7/14 >>7/20; 7/21>> (9/15) Fluconazole 7/17>> 7/23 for oral thrush  7/18 VP 22, VT 9 (drawn late), AUC 385 on 1500 q12 >> incr 1250 q8 7/23: VP 28, VT 21,  AUC 575 >> change to 1750/12h for AUC 536 7/26-27: VP 31, VT 14 (drawn early), AUC 523 >> con't 1750mg  q12 8/4-8/5: VP 33, VT 22 ( both drawn early)- AUC 551>>Continue 1500 q 12 for now  8/9 VP 27, VT 14 (on time) - AUC 492, continue 1500 mg IV q12h   7/13 BCx - 3/4 MRSA 7/15 Bcx: staph aureus 7/16 wound cx - neg 7/17 Bcx- 3/4 MRSA 7/19 Bcx: MRSA 7/20 Bcx: staph aureus 7/22 Bcx - negative 7/24 Bcx - NGF 7/26 Bcx - NGF  8/24, RPh 02/05/2021, 12:56 PM

## 2021-02-05 NOTE — Progress Notes (Signed)
PROGRESS NOTE   Subjective/Complaints: Patient seen laying in bed this morning.  He states he slept fairly overnight due to back pain.  He states he was given Toradol sometime ago with benefit and once again-discussed limitations of Toradol.  ROS: + Back pain.  Denies CP, SOB, N/V/D  Objective:   [place] EKG SITE RITE  Result Date: 02/04/2021 If Site Rite image not attached, placement could not be confirmed due to current cardiac rhythm.  No results for input(s): WBC, HGB, HCT, PLT in the last 72 hours. Recent Labs    02/03/21 0608  NA 128*  K 3.5  CL 94*  CO2 26  GLUCOSE 218*  BUN <5*  CREATININE 0.41*  CALCIUM 8.2*     Intake/Output Summary (Last 24 hours) at 02/05/2021 1242 Last data filed at 02/05/2021 0816 Gross per 24 hour  Intake 578 ml  Output 900 ml  Net -322 ml      Pressure Injury 01/28/21 Coccyx Left Stage 2 -  Partial thickness loss of dermis presenting as a shallow open injury with a red, pink wound bed without slough. pink, moist (Active)  01/28/21 1845  Location: Coccyx  Location Orientation: Left  Staging: Stage 2 -  Partial thickness loss of dermis presenting as a shallow open injury with a red, pink wound bed without slough.  Wound Description (Comments): pink, moist  Present on Admission: Yes    Physical Exam: Vital Signs Blood pressure (!) 155/88, pulse 95, temperature 98.7 F (37.1 C), temperature source Oral, resp. rate 16, height 5\' 11"  (1.803 m), weight 102.7 kg, SpO2 97 %. Constitutional: No distress . Vital signs reviewed. HENT: Normocephalic.  Atraumatic. Eyes: EOMI. No discharge. Cardiovascular: No JVD.  RRR. Respiratory: Normal effort.  No stridor.  Bilateral clear to auscultation. GI: Non-distended.  BS +. Skin: Warm and dry.  Intact. Psych: Normal mood.  Normal behavior. Musc: No edema in extremities.  No tenderness in extremities. Neuro: Alert and oriented Motor: Grossly 4/5  throughout   Assessment/Plan: 1. Functional deficits which require 3+ hours per day of interdisciplinary therapy in a comprehensive inpatient rehab setting. Physiatrist is providing close team supervision and 24 hour management of active medical problems listed below. Physiatrist and rehab team continue to assess barriers to discharge/monitor patient progress toward functional and medical goals  Care Tool:  Bathing  Bathing activity did not occur: Refused           Bathing assist       Upper Body Dressing/Undressing Upper body dressing   What is the patient wearing?: Pull over shirt    Upper body assist Assist Level: Maximal Assistance - Patient 25 - 49%    Lower Body Dressing/Undressing Lower body dressing      What is the patient wearing?: Pants, Underwear/pull up     Lower body assist Assist for lower body dressing: Total Assistance - Patient < 25%     Toileting Toileting    Toileting assist Assist for toileting: Independent with assistive device Assistive Device Comment: Urinal   Transfers Chair/bed transfer  Transfers assist     Chair/bed transfer assist level: Minimal Assistance - Patient > 75% (stand pivot with RW)  Locomotion Ambulation   Ambulation assist      Assist level: 2 helpers Assistive device: Walker-rolling Max distance: 12-15'   Walk 10 feet activity   Assist  Walk 10 feet activity did not occur: Safety/medical concerns    Assistive device: Walker-rolling   Walk 50 feet activity   Assist Walk 50 feet with 2 turns activity did not occur: Safety/medical concerns  Assist level: Total Assistance - Patient < 25%      Walk 150 feet activity   Assist Walk 150 feet activity did not occur: Safety/medical concerns         Walk 10 feet on uneven surface  activity   Assist Walk 10 feet on uneven surfaces activity did not occur: Safety/medical concerns         Wheelchair     Assist Will patient use  wheelchair at discharge?: No Type of Wheelchair: Manual    Wheelchair assist level: Contact Guard/Touching assist Max wheelchair distance: 150    Wheelchair 50 feet with 2 turns activity    Assist        Assist Level: Contact Guard/Touching assist   Wheelchair 150 feet activity     Assist      Assist Level: Contact Guard/Touching assist   Blood pressure (!) 155/88, pulse 95, temperature 98.7 F (37.1 C), temperature source Oral, resp. rate 16, height 5\' 11"  (1.803 m), weight 102.7 kg, SpO2 97 %.  Medical Problem List and Plan: 1.  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   Continue CIR 2.  Antithrombotics: -DVT/anticoagulation:  Pharmaceutical: Lovenox- don't anticipate need at discharge              -antiplatelet therapy: Baby aspirin daily 3. Pain Management: OxyContin 10 mg every 12 hours with oxycodone 5 mg every 4 hours as needed             -Robaxin 500 every 6 as needed ordered             -Voltaren gel for topical relief             -Added K pad for muscle spasm and low back pain             -Tylenol for mild pain -Given Toradol IM x2 days as needed 4. Mood: Team to provide ego support             -continue Effexor XR 75 mg daily             -antipsychotic agents: N/A, had been haldol before 5. Neuropsych: This patient is capable of making decisions on his own behalf. -Patient's mental status appears to be improving  6. Skin/Wound Care: Maximize nutritional intake, local skin care as needed 7. Fluids/Electrolytes/Severe Protein malutrition: Patient now is on regular diet.  Encourage p.o. intake.          eating 100% meals  8.  Sepsis secondary to discitis and epidural abscess/bacteremia             -continue IV vancomycin through 9/15             -Monitor clinically and with regular labs 9.  Uncontrolled type 2 diabetes             -Lantus insulin 5 units with sliding scale insulin  coverage--increased to 10 units 8/6, increased to 15 on 8/12  CBG (last 3)  Recent Labs    02/04/21 2144 02/05/21 0627 02/05/21 1132  GLUCAP 162*  158* 152*    10.  Chronic diastolic congestive heart failure             -Monitor daily weights             -Anasarca most likely due to nutritional status as opposed to cardiac             -Recent 2D echo demonstrated no wall abnormalities   Filed Weights   01/31/21 0400 02/02/21 0630 02/03/21 0529  Weight: 98.3 kg 102.9 kg 102.7 kg   Stable on 8/11 11.  Hyponatremia- off Free H20  Sodium 128 on 8/11, labs ordered for Monday             -Hypokalemia, likely due to lasix will supplement K, x 1 d ,improved off lasix   Labs ordered for Monday 12. Anemia d/t illness  Hemoglobin 7.6 on 8/4, labs ordered for Monday 13. Thrombocytosis:  Platelets 526 on 8/4, labs ordered for Monday    LOS: 8 days A FACE TO FACE EVALUATION WAS PERFORMED  Jorrell Kuster Karis Juba 02/05/2021, 12:42 PM

## 2021-02-05 NOTE — Progress Notes (Signed)
Occupational Therapy Session Note  Patient Details  Name: Louis [name] MRN: [id number] Date of Birth: 1965/12/26  Today's Date: 02/05/2021 OT Individual Time: 6720-9470 OT Individual Time Calculation (min): 23 min + 43 min   Short Term Goals: Week 1:  OT Short Term Goal 1 (Week 1): Patient will complete bed mobility and functional transfers with CGA OT Short Term Goal 2 (Week 1): patient will complete bathing and toileting with min A OT Short Term Goal 3 (Week 1): patient will complete dressing tasks with min A using assistive devices with good carryover OT Short Term Goal 4 (Week 1): patient will tolerate standing activities and basic HM for 5-10 minutes with CGA  Skilled Therapeutic Interventions/Progress Updates:    Session 1 (9628-3662): Pt received semi-reclined in bed, initially agreeable to therapy. Session focus on self-care retraining, nonpharm pain/nausea relief strategies, therapeutic rapport building in prep for improved ADL/IADL/func mobility performance + decreased caregiver burden. Pt relates that he had an episode of emesis just prior and had ongoing nausea. Initially, agreeable to attempt to get dressed this morning, but cont to experience "gagging." Therapeutic self utilized for emotional support as pt relates "I'm tired of feeling so bad." Pt cont to decline OOB therapy or getting dressed at bed level. Provided diet ginger ale for nausea relief and placed k-pad in reach for ongoing back/BLE pain. Pt agreeable to attempt to get OOB later.  Pt left semi-reclined in bed with bed alarm engaged, call bell in reach, and all immediate needs met.   Session 2 [phone]): Pt received in bed -hand off from PT. Agreeable to OT session. Session focus on self-care retraining, activity tolerance, func transfers, pyschosocial well-being in prep for improved ADL/IADL/func mobility performance + decreased caregiver burden. Donned B thigh high teds to assist with decrease in swelling and gripper  socks total A bed level 2/2 time management + no reacher/sock aid available for pt use. Came to sitting EOB with increased time and min A to lift trunk. No initial c/o dizziness. Donned shirt with close S. Stand-pivot with RW > w/c CGA. Donned shorts with max A. Total A w/c transport to and from outside area for improved psychosocial wellbeing as pt had not been able to leave room in several days. Pt becoming nausea and requesting emesis bag. Stand-pivot back to bed in room with increased time for initiation 2/2 nausea. Returned to supine with mod A to progress BLE onto bed. Elevated BLE on pillow to assist with decrease in swelling. RN notified per pt req for nausea rx.  Pt left semi-reclined in bed with bed alarm engaged, call bell in reach, and all immediate needs met.   Therapy Documentation Precautions:  Precautions Precautions: Back Precaution Booklet Issued: No Precaution Comments: Pt unable to recall 3/3 back precautions Restrictions Weight Bearing Restrictions: No  Pain: ongoing back/BLE pain, did not rate ADL: See Care Tool for more details.  Therapy/Group: Individual Therapy   Volanda Napoleon MS, OTR/L  02/05/2021, 6:46 AM

## 2021-02-05 NOTE — Progress Notes (Signed)
Occupational Therapy Session Note  Patient Details  Name: Louis [name] MRN: [id number] Date of Birth: 1965/09/25  Today's Date: 02/06/2021 OT Individual Time: 3235-5732 OT Individual Time Calculation (min): 53 min  22 minutes missed  Short Term Goals: Week 1:  OT Short Term Goal 1 (Week 1): Patient will complete bed mobility and functional transfers with CGA OT Short Term Goal 2 (Week 1): patient will complete bathing and toileting with min A OT Short Term Goal 3 (Week 1): patient will complete dressing tasks with min A using assistive devices with good carryover OT Short Term Goal 4 (Week 1): patient will tolerate standing activities and basic HM for 5-10 minutes with CGA  Skilled Therapeutic Interventions/Progress Updates:    Pt greeted in bed, reporting nausea and 9/10 pain in back and LEs. Per pt, pain medicine does not help. Nor does his k-pad. We discussed holistic pain mgt strategies such as deep breathing, music listening, and engagement in therapeutic activities. Pt reports he does not particularly care for music, has trouble seeing. He prefers to close his eyes and gently breathe to relieve pain. We discussed negative neuroplastic effects in regards to perseveration on his pain. Pt verbalized understanding. Transitioned to edema mgt, pt with pitting edema in lower legs + feet. Provided him with pillows to help with elevation, taught him ankle pumps and gentle LE mobility exercises. Pt very limited with active LE movement due to pain/swelling/weakness. Issued him a leg lifter and he practiced a few guided exercises to help with decreasing edema. He reported liking the leg lifter. Education regarding sleep hygiene also completed, as pt reports it is difficult at night to sleep due to all of the hospital sounds. OT suggested using ear plugs from supply closet, aromatherapy, and/or meditation/nature sounds via cell phone to address therapeutically. Pt refused to try any of these strategies,  providing personal reasons as to why he didn't want to attempt. Due to his sensitivity to smells, pt denied trying aromatherapy to decrease feelings of nausea. Notified RN of pts request for antinausea medicine and possibly pain medicine. Time missed due to pts refusal to get OOB or participate in any other therapeutic/ADL-related activity, feeling too nauseated and in too much pain. Provided him with a soft football to use for a short time in the room to work on UB strength/endurance per his tolerance.   Therapy Documentation Precautions:  Precautions Precautions: Back Precaution Booklet Issued: No Precaution Comments: Pt unable to recall 3/3 back precautions Restrictions Weight Bearing Restrictions: No  Pain: Pain Assessment Pain Scale: 0-10 Pain Score: 5  ADL: ADL Eating: Set up Where Assessed-Eating: Chair Grooming: Setup Where Assessed-Grooming: Chair Upper Body Bathing: Not assessed Lower Body Bathing: Not assessed Upper Body Dressing: Minimal assistance Where Assessed-Upper Body Dressing: Bed level Lower Body Dressing: Maximal assistance Where Assessed-Lower Body Dressing: Bed level ADL Comments: patient declined shower / bathing due to fatigue, able to use urinal with set up      Therapy/Group: Individual Therapy  Louis [name] 02/06/2021, 12:24 PM

## 2021-02-06 DIAGNOSIS — G062 Extradural and subdural abscess, unspecified: Secondary | ICD-10-CM | POA: Diagnosis not present

## 2021-02-06 DIAGNOSIS — R5381 Other malaise: Secondary | ICD-10-CM | POA: Diagnosis not present

## 2021-02-06 DIAGNOSIS — E1165 Type 2 diabetes mellitus with hyperglycemia: Secondary | ICD-10-CM | POA: Diagnosis not present

## 2021-02-06 DIAGNOSIS — R0989 Other specified symptoms and signs involving the circulatory and respiratory systems: Secondary | ICD-10-CM

## 2021-02-06 DIAGNOSIS — I5032 Chronic diastolic (congestive) heart failure: Secondary | ICD-10-CM | POA: Diagnosis not present

## 2021-02-06 LAB — BASIC METABOLIC PANEL
Anion gap: 6 (ref 5–15)
BUN: <5 mg/dL — ABNORMAL LOW (ref 6–20)
CO2: 25 mmol/L (ref 22–32)
Calcium: 8.3 mg/dL — ABNORMAL LOW (ref 8.9–10.3)
Chloride: 98 mmol/L (ref 98–111)
Creatinine, Ser: 0.39 mg/dL — ABNORMAL LOW (ref 0.61–1.24)
GFR, Estimated: >60 mL/min (ref >60)
Glucose, Bld: 109 mg/dL — ABNORMAL HIGH (ref 70–99)
Potassium: 3.6 mmol/L (ref 3.5–5.1)
Sodium: 129 mmol/L — ABNORMAL LOW (ref 135–145)

## 2021-02-06 LAB — GLUCOSE, CAPILLARY
Glucose-Capillary: 101 mg/dL — ABNORMAL HIGH (ref 70–99)
Glucose-Capillary: 126 mg/dL — ABNORMAL HIGH (ref 70–99)
Glucose-Capillary: 186 mg/dL — ABNORMAL HIGH (ref 70–99)
Glucose-Capillary: 156 mg/dL — ABNORMAL HIGH (ref 70–99)

## 2021-02-06 LAB — C-REACTIVE PROTEIN: CRP: 8.6 mg/dL — ABNORMAL HIGH (ref <1.0)

## 2021-02-06 NOTE — Progress Notes (Signed)
Physical Therapy Session Note  Patient Details  Name: Louis [name] MRN: [id number] Date of Birth: 03-09-1966  Today's Date: 02/06/2021 PT Individual Time:  -      Short Term Goals: Week 1:  PT Short Term Goal 1 (Week 1): Pt will ambulate 91ft minA PT Short Term Goal 2 (Week 1): Pt will complete sit to stand CGA PT Short Term Goal 3 (Week 1): Pt will be able to recall 3/3 back precautions PT Short Term Goal 4 (Week 1): Pt will be able to complete bed mobility MinA PT Short Term Goal 5 (Week 1): Pt will initiate stair training  Skilled Therapeutic Interventions/Progress Updates:  Patient supine in bed on entrance to room. Patient alert and agreeable to PT session, but is immediately relating back pain and headache at start of session. Unsure of what he will be able to participate in.   Therapeutic Activity: Bed Mobility: Patient performed supine --> sit with Mod A with continued vc required to complete with log roll technique. Most assist required with bringing UB to upright seated position and to assist BLE back to bed surface on returning to supine. Pt unable to remain seated for long d/t increased pain in back with radiating symptoms. Unwilling to attempt further.   Therapeutic Exercise: Patient relates feeling that his BLE are stiff and muscles are aching. Performed the following exercises with vc/ tc for proper technique. AAROM/ AROM as noted: - AAROM SLR with hold in max range flexion for hamstring stretch. Attempt for slow, eccentric return requiring AAROM. Performed 2x5 sets each BLE.  - AAROM heel slides with AROM extensor push against PT resistance 2x10 BLE.  - AAROM SAQ 1x10 reps for BLE with focus on slow, eccentric return - Passive piriformis stretch with block to pelvis to maintain back precautions and tolerated very well by pt. 5x30 sec holds for BLE.   Continued education provided re: pain and need for mobility such as walking in order to control swelling and eventually decrease  pain from swelling at surgical site.   Patient supine  in bed at end of session with brakes locked, and all needs within reach. OT entering room to begin session.   Pt's thermotherapy machine leaking on floor and machine turned off, towel placed under machine, and nursing notified re: need for maintenance.   Therapy Documentation Precautions:  Precautions Precautions: Back Precaution Booklet Issued: No Precaution Comments: Pt unable to recall 3/3 back precautions Restrictions Weight Bearing Restrictions: No  Therapy/Group: Individual Therapy  Loel Dubonnet PT, DPT 02/06/2021, 11:22 PM

## 2021-02-06 NOTE — Plan of Care (Signed)
  Problem: RH PAIN MANAGEMENT Goal: RH STG PAIN MANAGED AT OR BELOW PT'S PAIN GOAL Description: Pain lss than 4 Outcome: Not Progressing   

## 2021-02-06 NOTE — Progress Notes (Signed)
PROGRESS NOTE   Subjective/Complaints: Patient seen laying in bed this morning.  He states he slept well overnight.  Resting comfortably and endorsing back pain only when asked about it.  He states medications appear to have provided some improvement.  ROS: Denies CP, SOB, N/V/D  Objective:   [place] EKG SITE RITE  Result Date: 02/04/2021 If Site Rite image not attached, placement could not be confirmed due to current cardiac rhythm.  No results for input(s): WBC, HGB, HCT, PLT in the last 72 hours. Recent Labs    02/06/21 0336  NA 129*  K 3.6  CL 98  CO2 25  GLUCOSE 109*  BUN <5*  CREATININE 0.39*  CALCIUM 8.3*     Intake/Output Summary (Last 24 hours) at 02/06/2021 0920 Last data filed at 02/06/2021 0409 Gross per 24 hour  Intake 940 ml  Output 1650 ml  Net -710 ml      Pressure Injury 01/28/21 Coccyx Left Stage 2 -  Partial thickness loss of dermis presenting as a shallow open injury with a red, pink wound bed without slough. pink, moist (Active)  01/28/21 1845  Location: Coccyx  Location Orientation: Left  Staging: Stage 2 -  Partial thickness loss of dermis presenting as a shallow open injury with a red, pink wound bed without slough.  Wound Description (Comments): pink, moist  Present on Admission: Yes    Physical Exam: Vital Signs Blood pressure (!) 152/90, pulse 94, temperature 98.2 F (36.8 C), temperature source Oral, resp. rate 18, height 5\' 11"  (1.803 m), weight 102.7 kg, SpO2 100 %. Constitutional: No distress . Vital signs reviewed. HENT: Normocephalic.  Atraumatic. Eyes: EOMI. No discharge. Cardiovascular: No JVD.  RRR. Respiratory: Normal effort.  No stridor.  Bilateral clear to auscultation. GI: Non-distended.  BS +. Skin: Warm and dry.  Intact. Psych: Normal mood.  Normal behavior. Musc: No edema in extremities.  No tenderness in extremities. Neuro: Alert and oriented Motor: Grossly 4/5  throughout, unchanged  Assessment/Plan: 1. Functional deficits which require 3+ hours per day of interdisciplinary therapy in a comprehensive inpatient rehab setting. Physiatrist is providing close team supervision and 24 hour management of active medical problems listed below. Physiatrist and rehab team continue to assess barriers to discharge/monitor patient progress toward functional and medical goals  Care Tool:  Bathing  Bathing activity did not occur: Refused           Bathing assist       Upper Body Dressing/Undressing Upper body dressing   What is the patient wearing?: Pull over shirt    Upper body assist Assist Level: Maximal Assistance - Patient 25 - 49%    Lower Body Dressing/Undressing Lower body dressing      What is the patient wearing?: Pants, Underwear/pull up     Lower body assist Assist for lower body dressing: Total Assistance - Patient < 25%     Toileting Toileting    Toileting assist Assist for toileting: Independent with assistive device Assistive Device Comment: Urinal   Transfers Chair/bed transfer  Transfers assist     Chair/bed transfer assist level: Minimal Assistance - Patient > 75% (stand pivot with RW)  Locomotion Ambulation   Ambulation assist      Assist level: 2 helpers Assistive device: Walker-rolling Max distance: 12-15'   Walk 10 feet activity   Assist  Walk 10 feet activity did not occur: Safety/medical concerns    Assistive device: Walker-rolling   Walk 50 feet activity   Assist Walk 50 feet with 2 turns activity did not occur: Safety/medical concerns  Assist level: Total Assistance - Patient < 25%      Walk 150 feet activity   Assist Walk 150 feet activity did not occur: Safety/medical concerns         Walk 10 feet on uneven surface  activity   Assist Walk 10 feet on uneven surfaces activity did not occur: Safety/medical concerns         Wheelchair     Assist Will patient  use wheelchair at discharge?: No Type of Wheelchair: Manual    Wheelchair assist level: Contact Guard/Touching assist Max wheelchair distance: 150    Wheelchair 50 feet with 2 turns activity    Assist        Assist Level: Contact Guard/Touching assist   Wheelchair 150 feet activity     Assist      Assist Level: Contact Guard/Touching assist   Blood pressure (!) 152/90, pulse 94, temperature 98.2 F (36.8 C), temperature source Oral, resp. rate 18, height 5\' 11"  (1.803 m), weight 102.7 kg, SpO2 100 %.  Medical Problem List and Plan: 1.  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   Continue CIR 2.  Antithrombotics: -DVT/anticoagulation:  Pharmaceutical: Lovenox- don't anticipate need at discharge              -antiplatelet therapy: Baby aspirin daily 3. Pain Management: OxyContin 10 mg every 12 hours with oxycodone 5 mg every 4 hours as needed             -Robaxin 500 every 6 as needed ordered             -Voltaren gel for topical relief             -Added K pad for muscle spasm and low back pain             -Tylenol for mild pain -Given Toradol IM x2 days as needed Relatively controlled on 8/14 4. Mood: Team to provide ego support             -continue Effexor XR 75 mg daily             -antipsychotic agents: N/A, had been haldol before 5. Neuropsych: This patient is capable of making decisions on his own behalf. -Patient's mental status appears to be improving  6. Skin/Wound Care: Maximize nutritional intake, local skin care as needed 7. Fluids/Electrolytes/Severe Protein malutrition: Patient now is on regular diet.  Encourage p.o. intake. 8.  Sepsis secondary to discitis and epidural abscess/bacteremia             -continue IV vancomycin through 9/15             -Monitor clinically and with regular labs 9.  Uncontrolled type 2 diabetes             -Lantus insulin 5 units with sliding scale insulin  coverage--increased to 10 units 8/6, increased to 15 on 8/12  CBG (last 3)  Recent Labs    02/05/21 1629 02/05/21 2058 02/06/21 0609  GLUCAP 224* 209* 101*    Remains elevated on 8/14,  but?  Improving 10.  Chronic diastolic congestive heart failure             -Monitor daily weights             -Anasarca most likely due to nutritional status as opposed to cardiac             -Recent 2D echo demonstrated no wall abnormalities   Filed Weights   01/31/21 0400 02/02/21 0630 02/03/21 0529  Weight: 98.3 kg 102.9 kg 102.7 kg   Stable on 8/11 11.  Hyponatremia- off Free H20  Sodium 129 on 8/14, labs ordered for tomorrow             -Hypokalemia, likely due to lasix will supplement K, x 1 d ,improved off lasix   Labs ordered for Monday 12. Anemia d/t illness  Hemoglobin 7.6 on 8/4, labs ordered for tomorrow 13. Thrombocytosis:  Platelets 526 on 8/4, labs ordered for tomorrow 14.  Blood pressure  Slightly labile on 8/14, monitor for trend    LOS: 9 days A FACE TO FACE EVALUATION WAS PERFORMED  Louis [name] Louis [name] 02/06/2021, 9:20 AM

## 2021-02-06 NOTE — Progress Notes (Signed)
Physical Therapy Session Note  Patient Details  Name: Louis [name] MRN: [id number] Date of Birth: [birth year]  Today's Date: 02/06/2021 PT Individual Time: 1300-1350 PT Individual Time Calculation (min): 50 min   Short Term Goals: Week 1:  PT Short Term Goal 1 (Week 1): Pt will ambulate 73ft minA PT Short Term Goal 2 (Week 1): Pt will complete sit to stand CGA PT Short Term Goal 3 (Week 1): Pt will be able to recall 3/3 back precautions PT Short Term Goal 4 (Week 1): Pt will be able to complete bed mobility MinA PT Short Term Goal 5 (Week 1): Pt will initiate stair training  Skilled Therapeutic Interventions/Progress Updates:    Pt received supine in bed, agreeable with encouragement to participate in therapy session. Pt reports 8/10 pain at rest in low back and into BLE, reports being premedicated prior to start of therapy session. Pt agreeable to attempt to sit up to EOB. Supine to sit while assisting patient with BLE management, pt unable to complete transfer due to increase in back pain that he says "shoots" into BLE as well as the rest of his body, returned to supine position. After rest break pt agreeable to attempt again with similar results. Pt unable to sit up to EOB this date due to severe pain. Pt agreeable to bed level therex. Pt requesting therapist stretch his BLE, encouraged patient to perform AAROM to continue to work on strengthening muscles. Supine BLE heel slides, hip abd, SAQ, modified SKFO (decreased ROM to adhere to back precautions) x 10 reps each with AAROM. Pt also noted to have BLE pitting edema with hypersensitivity to touch in BLE. Education with patient regarding edema management including elevation, TEDs, exercise, and medical management via medication. Pt is able to scoot himself up towards [facility] independently with use of hospital bed features. Pt left seated in bed with needs in reach at end of session. Pt missed 10 min of scheduled therapy session due to pain. Pt very  limited functionally this session by ongoing pain in his back that radiates into BLE with movement.  Therapy Documentation Precautions:  Precautions Precautions: Back Precaution Booklet Issued: No Precaution Comments: Pt unable to recall 3/3 back precautions Restrictions Weight Bearing Restrictions: No General: PT Amount of Missed Time (min): 10 Minutes PT Missed Treatment Reason: Pain     Therapy/Group: Individual Therapy   Peter Congo, PT, DPT, CSRS  02/06/2021, 1:54 PM

## 2021-02-06 NOTE — Progress Notes (Addendum)
Patient reporting nausea and BLE pain. Zofran IV given at 1911 and oxy given at 2014 for ongoing pain in BLE and headache. Slight decrease in pain but nausea unrelieved at that time. Patient was still dry heaving and requested IV vancomycin be stopped for tonight. Vancomycin stopped at 2102. Pharmacy notified and MD Allena Katz notified of patient request/condition. Vitals stable. 2+ edema to BLE. Elevated extremities. No new order received. On next round patient reported nausea improvement. Trazodone given to promote sleep.   0400- Patient nauseated, vomiting small amount of clear liquid. 4mg  Zofran IV given. Oxy 5mg  given for pain 9 out of 10 to BLE.

## 2021-02-06 NOTE — Progress Notes (Signed)
Occupational Therapy Session Note  Patient Details  Name: Louis [name] MRN: [id number] Date of Birth: 03-Jun-1966  Today's Date: 02/07/2021 OT Individual Time: 1245-8099 OT Individual Time Calculation (min): 55 min   Skilled Therapeutic Interventions/Progress Updates:    Pt greeted in bed with RN present. Pt premedicated for pain and nausea. Pt reported he had a rough night last night due to n/v however he wanted to try to sit up as long as OT had +2 assistance. He reported sitting up with 1 assist this weekend and hurting his back. Therefore OT called for another staff to assist, when RN arrived pt completed supine<sit with Min A +2, note that pt had a very particular way that he wanted to be assisted, directed transfer to EOB. He required a few minutes to collect himself when sitting up, then donned overhead shirt with Min A. Min A +2 for stand pivot<recliner without AD. Pt asking what he could do for leg strength/edema control while sitting up in the recliner. Reeducated pt on use of the leg lifter for active assist ROM exercises as well as bilateral hamstring stretches. Pt completed 1 leg lift exercise but declined doing more due to nausea/malaise. OT donned his thigh high Teds and elevated B LEs with pillows for edema mgt. Continued education regarding importance of limb elevation as pt does not elevate LEs when in the bed, keeps foot of bed low for comfort. He remained in the recliner with all needs within reach (very particular regarding his setup) and chair alarm set. Tx focus placed on OOB tolerance, pt education, transfers, and edema mgt.   Pt declined participation in any form of ADL task today, stated he doesn't like to even wash face/brush teeth with "people watching." Gently educated pt on role of OT and that he has self care goals. Pt would benefit from additional education about this.   Able to state 3/3 back precautions today!  Therapy Documentation Precautions:   Precautions Precautions: Back Precaution Booklet Issued: No Precaution Comments: Pt unable to recall 3/3 back precautions Restrictions Weight Bearing Restrictions: No  Pain: in back, LEs Pain Assessment Pain Scale: 0-10 Pain Score: 7  ADL: ADL Eating: Set up Where Assessed-Eating: Chair Grooming: Setup Where Assessed-Grooming: Chair Upper Body Bathing: Not assessed Lower Body Bathing: Not assessed Upper Body Dressing: Minimal assistance Where Assessed-Upper Body Dressing: Bed level Lower Body Dressing: Maximal assistance Where Assessed-Lower Body Dressing: Bed level ADL Comments: patient declined shower / bathing due to fatigue, able to use urinal with set up     Therapy/Group: Individual Therapy  Norton Bivins A Erina Hamme 02/07/2021, 12:24 PM

## 2021-02-07 DIAGNOSIS — R5381 Other malaise: Secondary | ICD-10-CM | POA: Diagnosis not present

## 2021-02-07 LAB — CBC
HCT: 24.7 % — ABNORMAL LOW (ref 39.0–52.0)
Hemoglobin: 7.8 g/dL — ABNORMAL LOW (ref 13.0–17.0)
MCH: 27.3 pg (ref 26.0–34.0)
MCHC: 31.6 g/dL (ref 30.0–36.0)
MCV: 86.4 fL (ref 80.0–100.0)
Platelets: 472 10*3/uL — ABNORMAL HIGH (ref 150–400)
RBC: 2.86 MIL/uL — ABNORMAL LOW (ref 4.22–5.81)
RDW: 15.9 % — ABNORMAL HIGH (ref 11.5–15.5)
WBC: 7 10*3/uL (ref 4.0–10.5)
nRBC: 0 % (ref 0.0–0.2)

## 2021-02-07 LAB — GLUCOSE, CAPILLARY
Glucose-Capillary: 145 mg/dL — ABNORMAL HIGH (ref 70–99)
Glucose-Capillary: 168 mg/dL — ABNORMAL HIGH (ref 70–99)
Glucose-Capillary: 154 mg/dL — ABNORMAL HIGH (ref 70–99)
Glucose-Capillary: 219 mg/dL — ABNORMAL HIGH (ref 70–99)

## 2021-02-07 MED ORDER — TRAMADOL HCL 50 MG PO TABS
50.0000 mg | ORAL_TABLET | Freq: Four times a day (QID) | ORAL | Status: DC | PRN
  Administered 2021-02-07 – 2021-02-20 (×30): 50 mg via ORAL
  Filled 2021-02-07 (×31): qty 1

## 2021-02-07 MED ORDER — CELECOXIB 100 MG PO CAPS
100.0000 mg | ORAL_CAPSULE | Freq: Two times a day (BID) | ORAL | Status: DC
  Administered 2021-02-07 – 2021-02-11 (×8): 100 mg via ORAL
  Filled 2021-02-07 (×8): qty 1

## 2021-02-07 MED ORDER — CELECOXIB 100 MG PO CAPS
100.0000 mg | ORAL_CAPSULE | Freq: Every day | ORAL | Status: DC
  Administered 2021-02-07: 100 mg via ORAL
  Filled 2021-02-07: qty 1

## 2021-02-07 NOTE — Progress Notes (Signed)
PROGRESS NOTE   Subjective/Complaints:   Pt felt like a "new man" on toradol   ROS: Denies CP, SOB, N/V/D  Objective:   No results found. Recent Labs    02/07/21 0356  WBC 7.0  HGB 7.8*  HCT 24.7*  PLT 472*   Recent Labs    02/06/21 0336  NA 129*  K 3.6  CL 98  CO2 25  GLUCOSE 109*  BUN <5*  CREATININE 0.39*  CALCIUM 8.3*     Intake/Output Summary (Last 24 hours) at 02/07/2021 0905 Last data filed at 02/07/2021 3007 Gross per 24 hour  Intake 840 ml  Output 2110 ml  Net -1270 ml      Pressure Injury 01/28/21 Coccyx Left Stage 2 -  Partial thickness loss of dermis presenting as a shallow open injury with a red, pink wound bed without slough. pink, moist (Active)  01/28/21 1845  Location: Coccyx  Location Orientation: Left  Staging: Stage 2 -  Partial thickness loss of dermis presenting as a shallow open injury with a red, pink wound bed without slough.  Wound Description (Comments): pink, moist  Present on Admission: Yes    Physical Exam: Vital Signs Blood pressure (!) 150/83, pulse 96, temperature 98.3 F (36.8 C), temperature source Oral, resp. rate 18, height 5\' 11"  (1.803 m), weight 102.7 kg, SpO2 100 %.  General: No acute distress Mood and affect are appropriate Heart: Regular rate and rhythm no rubs murmurs or extra sounds Lungs: Clear to auscultation, breathing unlabored, no rales or wheezes Abdomen: Positive bowel sounds, soft nontender to palpation, nondistended Extremities: No clubbing, cyanosis, or edema Skin: No evidence of breakdown, no evidence of rash   Neuro: Alert and oriented Motor: Grossly 4/5 throughout, unchanged  Assessment/Plan: 1. Functional deficits which require 3+ hours per day of interdisciplinary therapy in a comprehensive inpatient rehab setting. Physiatrist is providing close team supervision and 24 hour management of active medical problems listed  below. Physiatrist and rehab team continue to assess barriers to discharge/monitor patient progress toward functional and medical goals  Care Tool:  Bathing  Bathing activity did not occur: Refused           Bathing assist       Upper Body Dressing/Undressing Upper body dressing   What is the patient wearing?: Pull over shirt    Upper body assist Assist Level: Minimal Assistance - Patient > 75%    Lower Body Dressing/Undressing Lower body dressing      What is the patient wearing?: Pants, Underwear/pull up     Lower body assist Assist for lower body dressing: Total Assistance - Patient < 25%     Toileting Toileting    Toileting assist Assist for toileting: Independent with assistive device Assistive Device Comment: Urinal   Transfers Chair/bed transfer  Transfers assist     Chair/bed transfer assist level: Minimal Assistance - Patient > 75% (stand pivot with RW)     Locomotion Ambulation   Ambulation assist      Assist level: 2 helpers Assistive device: Walker-rolling Max distance: 12-15'   Walk 10 feet activity   Assist  Walk 10 feet activity did not occur: Safety/medical concerns  Assistive device: Walker-rolling   Walk 50 feet activity   Assist Walk 50 feet with 2 turns activity did not occur: Safety/medical concerns  Assist level: Total Assistance - Patient < 25%      Walk 150 feet activity   Assist Walk 150 feet activity did not occur: Safety/medical concerns         Walk 10 feet on uneven surface  activity   Assist Walk 10 feet on uneven surfaces activity did not occur: Safety/medical concerns         Wheelchair     Assist Will patient use wheelchair at discharge?: No Type of Wheelchair: Manual    Wheelchair assist level: Contact Guard/Touching assist Max wheelchair distance: 150    Wheelchair 50 feet with 2 turns activity    Assist        Assist Level: Contact Guard/Touching assist    Wheelchair 150 feet activity     Assist      Assist Level: Contact Guard/Touching assist   Blood pressure (!) 150/83, pulse 96, temperature 98.3 F (36.8 C), temperature source Oral, resp. rate 18, height 5\' 11"  (1.803 m), weight 102.7 kg, SpO2 100 %.  Medical Problem List and Plan: 1.  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   Continue CIR PT< OT 2.  Antithrombotics: -DVT/anticoagulation:  Pharmaceutical: Lovenox- don't anticipate need at discharge              -antiplatelet therapy: Baby aspirin daily 3. Pain Management: OxyContin 10 mg every 12 hours with oxycodone 5 mg every 4 hours as needed             -Robaxin 500 every 6 as needed ordered             -Voltaren gel for topical relief             -Added K pad for muscle spasm and low back pain             -Tylenol for mild pain -Given Toradol IM x2 days as needed, did great with this, will try low dose celebrex 100mg  daily to reduce need for narcotic   4. Mood: Team to provide ego support             -continue Effexor XR 75 mg daily             -antipsychotic agents: N/A, had been haldol before 5. Neuropsych: This patient is capable of making decisions on his own behalf. -Patient's mental status appears to be improving  6. Skin/Wound Care: Maximize nutritional intake, local skin care as needed 7. Fluids/Electrolytes/Severe Protein malutrition: Patient now is on regular diet.  Encourage p.o. intake. 8.  Sepsis secondary to discitis and epidural abscess/bacteremia             -continue IV vancomycin through 9/15             -Monitor clinically and with regular labs 9.  Uncontrolled type 2 diabetes             -Lantus insulin 5 units with sliding scale insulin coverage--increased to 10 units 8/6, increased to 15 on 8/12  CBG (last 3)  Recent Labs    02/06/21 1632 02/06/21 2126 02/07/21 0614  GLUCAP 186* 156* 145*   8/15Improving 10.  Chronic diastolic  congestive heart failure             -Monitor daily weights             -  Anasarca most likely due to nutritional status as opposed to cardiac             -Recent 2D echo demonstrated no wall abnormalities   Filed Weights   01/31/21 0400 02/02/21 0630 02/03/21 0529  Weight: 98.3 kg 102.9 kg 102.7 kg   Stable on 8/11 11.  Hyponatremia- off Free H20  Sodium 129 on 8/14, stable 8/15 asymptomatic              -Hypokalemia, likely due to lasix will supplement K, x 1 d ,improved off lasix 3.6 on 8/15   12. Anemia d/t illness  Hemoglobin 7.6 on 8/4, stable at 7.8 on 8/15 13. Thrombocytosis:  Platelets 526 on 8/4, improving  476K on 8/15 14.  Blood pressure   Vitals:   02/06/21 2009 02/07/21 0413  BP: (!) 157/89 (!) 150/83  Pulse: 98 96  Resp: 18 18  Temp: (!) 95.9 F (35.5 C) 98.3 F (36.8 C)  SpO2: 98% 100%  Systolic elevation Norvasc 5mg  , losartan 25mg  daily, Toprol XL 25mg  daily  Increase toprol     LOS: 10 days A FACE TO FACE EVALUATION WAS PERFORMED  02/07/2021, 9:05 AM

## 2021-02-07 NOTE — Progress Notes (Signed)
Nutrition Follow-up  DOCUMENTATION CODES:   Severe malnutrition in context of acute illness/injury  INTERVENTION:  Continue Ensure Max po BID, each supplement provides 150 kcal and 30 grams of protein.    Continue Magic cup TID with meals, each supplement provides 290 kcal and 9 grams of protein   Continue Juven BID, each packet provides 95 calories, 2.5 grams of protein.   Encourage adequate PO intake.   NUTRITION DIAGNOSIS:   Severe Malnutrition related to acute illness (MRSA bacteremia) as evidenced by mild fat depletion, moderate muscle depletion, energy intake < or equal to 50% for > or equal to 5 days; ongoing  GOAL:   Patient will meet greater than or equal to 90% of their needs; progressing  MONITOR:   PO intake, Supplement acceptance, Skin, Weight trends, Labs, I & O's  REASON FOR ASSESSMENT:   Malnutrition Screening Tool    ASSESSMENT:   55 yo male admitted to rehab 8/5 from [facility] acute care where he was admitted for worsening back pain after laminectomy on 6/28.  Drain was placed for subdural fluid collections. Patient was treated for MRSA bacteremia. TEE negative for vegetation. PMH includes DM, CAD, HTN, HLD, sleep apnea.  Meal completion has been 50-100%. Pt currently has Ensure max and Juven ordered with varied consumption. RD to continue with current orders to aid in caloric and protein needs. Pt encouraged to eat his food at meals and to drink his supplements.   Labs and medications reviewed.   Diet Order:   Diet Order             Diet heart healthy/carb modified Room service appropriate? Yes; Fluid consistency: Thin  Diet effective now                   EDUCATION NEEDS:   Education needs have been addressed  Skin:  Skin Assessment: Reviewed RN Assessment Skin Integrity Issues:: Stage II Stage II: coccyx  Last BM:  8/12  Height:   Ht Readings from Last 1 Encounters:  01/28/21 5\' 11"  (1.803 m)    Weight:   Wt Readings from Last 1  Encounters:  02/03/21 102.7 kg    BMI:  Body mass index is 31.58 kg/m.  Estimated Nutritional Needs:   Kcal:  2200-2400  Protein:  120-140 gm  Fluid:  >/= 2.2 L  04/05/21, MS, RD, LDN RD pager number/after hours weekend pager number on Amion.

## 2021-02-08 DIAGNOSIS — R5381 Other malaise: Secondary | ICD-10-CM | POA: Diagnosis not present

## 2021-02-08 LAB — VANCOMYCIN, PEAK: Vancomycin Pk: 23 ug/mL — ABNORMAL LOW (ref 30–40)

## 2021-02-08 LAB — GLUCOSE, CAPILLARY
Glucose-Capillary: 300 mg/dL — ABNORMAL HIGH (ref 70–99)
Glucose-Capillary: 101 mg/dL — ABNORMAL HIGH (ref 70–99)
Glucose-Capillary: 150 mg/dL — ABNORMAL HIGH (ref 70–99)
Glucose-Capillary: 162 mg/dL — ABNORMAL HIGH (ref 70–99)

## 2021-02-08 LAB — VANCOMYCIN, TROUGH: Vancomycin Tr: 13 ug/mL — ABNORMAL LOW (ref 15–20)

## 2021-02-08 NOTE — Progress Notes (Signed)
PROGRESS NOTE   Subjective/Complaints: Feels better today , wife at bedside ,  Started on celebrex yest Nausea and HA improved   Received tramadol twice, prn oxy x 3 in last 24h , started compazine prior to vanc   ROS: Denies CP, SOB, N/V/D  Objective:   No results found. Recent Labs    02/07/21 0356  WBC 7.0  HGB 7.8*  HCT 24.7*  PLT 472*    Recent Labs    02/06/21 0336  NA 129*  K 3.6  CL 98  CO2 25  GLUCOSE 109*  BUN <5*  CREATININE 0.39*  CALCIUM 8.3*     Intake/Output Summary (Last 24 hours) at 02/08/2021 9937 Last data filed at 02/07/2021 1850 Gross per 24 hour  Intake 720 ml  Output 1100 ml  Net -380 ml      Pressure Injury 01/28/21 Coccyx Left Stage 2 -  Partial thickness loss of dermis presenting as a shallow open injury with a red, pink wound bed without slough. pink, moist (Active)  01/28/21 1845  Location: Coccyx  Location Orientation: Left  Staging: Stage 2 -  Partial thickness loss of dermis presenting as a shallow open injury with a red, pink wound bed without slough.  Wound Description (Comments): pink, moist  Present on Admission: Yes    Physical Exam: Vital Signs Blood pressure 123/72, pulse 86, temperature 98.6 F (37 C), temperature source Oral, resp. rate 18, height 5\' 11"  (1.803 m), weight 102.7 kg, SpO2 98 %.  General: No acute distress Mood and affect are appropriate Heart: Regular rate and rhythm no rubs murmurs or extra sounds Lungs: Clear to auscultation, breathing unlabored, no rales or wheezes Abdomen: Positive bowel sounds, soft nontender to palpation, nondistended Extremities: No clubbing, cyanosis, or edema Skin: No evidence of breakdown, no evidence of rash   Neuro: Alert and oriented Motor: Grossly 4/5 throughout, unchanged  Assessment/Plan: 1. Functional deficits which require 3+ hours per day of interdisciplinary therapy in a comprehensive inpatient rehab  setting. Physiatrist is providing close team supervision and 24 hour management of active medical problems listed below. Physiatrist and rehab team continue to assess barriers to discharge/monitor patient progress toward functional and medical goals  Care Tool:  Bathing  Bathing activity did not occur: Refused           Bathing assist       Upper Body Dressing/Undressing Upper body dressing   What is the patient wearing?: Pull over shirt    Upper body assist Assist Level: Minimal Assistance - Patient > 75%    Lower Body Dressing/Undressing Lower body dressing      What is the patient wearing?: Pants, Underwear/pull up     Lower body assist Assist for lower body dressing: Total Assistance - Patient < 25%     Toileting Toileting    Toileting assist Assist for toileting: Independent with assistive device Assistive Device Comment: Urinal   Transfers Chair/bed transfer  Transfers assist     Chair/bed transfer assist level: Minimal Assistance - Patient > 75% (stand pivot with RW)     Locomotion Ambulation   Ambulation assist      Assist level: 2 helpers Assistive  device: Walker-rolling Max distance: 12-15'   Walk 10 feet activity   Assist  Walk 10 feet activity did not occur: Safety/medical concerns    Assistive device: Walker-rolling   Walk 50 feet activity   Assist Walk 50 feet with 2 turns activity did not occur: Safety/medical concerns  Assist level: Total Assistance - Patient < 25%      Walk 150 feet activity   Assist Walk 150 feet activity did not occur: Safety/medical concerns         Walk 10 feet on uneven surface  activity   Assist Walk 10 feet on uneven surfaces activity did not occur: Safety/medical concerns         Wheelchair     Assist Will patient use wheelchair at discharge?: No Type of Wheelchair: Manual    Wheelchair assist level: Contact Guard/Touching assist Max wheelchair distance: 150     Wheelchair 50 feet with 2 turns activity    Assist        Assist Level: Contact Guard/Touching assist   Wheelchair 150 feet activity     Assist      Assist Level: Contact Guard/Touching assist   Blood pressure 123/72, pulse 86, temperature 98.6 F (37 C), temperature source Oral, resp. rate 18, height 5\' 11"  (1.803 m), weight 102.7 kg, SpO2 98 %.  Medical Problem List and Plan: 1.  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   Continue CIR PT< OT 2.  Antithrombotics: -DVT/anticoagulation:  Pharmaceutical: Lovenox- don't anticipate need at discharge              -antiplatelet therapy: Baby aspirin daily 3. Pain Management: OxyContin 10 mg every 12 hours with oxycodone 5 mg every 4 hours as needed             -Robaxin 500 every 6 as needed ordered             -Voltaren gel for topical relief             -Added K pad for muscle spasm and low back pain             -Tylenol for mild pain -Given Toradol IM x2 days as needed, did great with this, will try low dose celebrex 100mg  daily to reduce need for narcotic   4. Mood: Team to provide ego support             -continue Effexor XR 75 mg daily             -antipsychotic agents: N/A, had been haldol before 5. Neuropsych: This patient is capable of making decisions on his own behalf. -Patient's mental status appears to be improving  6. Skin/Wound Care: Maximize nutritional intake, local skin care as needed 7. Fluids/Electrolytes/Severe Protein malutrition: Patient now is on regular diet.  Encourage p.o. intake. 8.  Sepsis secondary to discitis and epidural abscess/bacteremia             -continue IV vancomycin through 9/15             -Monitor clinically and with regular labs 9.  Uncontrolled type 2 diabetes             -Lantus insulin 5 units with sliding scale insulin coverage--increased to 10 units 8/6, increased to 15 on 8/12  CBG (last 3)  Recent Labs     02/07/21 1627 02/07/21 2110 02/08/21 0611  GLUCAP 154* 219* 300*   8/16 Improving 10.  Chronic diastolic  congestive heart failure             -Monitor daily weights             -Anasarca most likely due to nutritional status as opposed to cardiac             -Recent 2D echo demonstrated no wall abnormalities   Filed Weights   01/31/21 0400 02/02/21 0630 02/03/21 0529  Weight: 98.3 kg 102.9 kg 102.7 kg   Stable on 8/11 11.  Hyponatremia- off Free H20  Sodium 129 on 8/14, stable 8/15 asymptomatic              -Hypokalemia, likely due to lasix will supplement K, x 1 d ,improved off lasix 3.6 on 8/15   12. Anemia d/t illness  Hemoglobin 7.6 on 8/4, stable at 7.8 on 8/15 13. Thrombocytosis:  Platelets 526 on 8/4, improving  476K on 8/15 14.  Blood pressure   Vitals:   02/07/21 2023 02/08/21 0356  BP: 119/81 123/72  Pulse: 87 86  Resp: 18 18  Temp: 98.1 F (36.7 C) 98.6 F (37 C)  SpO2: 100% 98%  8/16 controlled  Norvasc 5mg  , losartan 25mg  daily, Toprol XL 25mg  daily      LOS: 11 days A FACE TO FACE EVALUATION WAS PERFORMED  02/08/2021, 9:06 AM

## 2021-02-08 NOTE — Progress Notes (Signed)
Physical Therapy Session Note  Patient Details  Name: Louis [name] MRN: [id number] Date of Birth: [birth year]  Today's Date: 02/08/2021 PT Individual Time: 6160-7371 PT Individual Time Calculation (min): 44 min   Short Term Goals: Week 1:  PT Short Term Goal 1 (Week 1): Pt will ambulate 57ft minA PT Short Term Goal 1 - Progress (Week 1): Met PT Short Term Goal 2 (Week 1): Pt will complete sit to stand CGA PT Short Term Goal 2 - Progress (Week 1): Met PT Short Term Goal 3 (Week 1): Pt will be able to recall 3/3 back precautions PT Short Term Goal 3 - Progress (Week 1): Met PT Short Term Goal 4 (Week 1): Pt will be able to complete bed mobility MinA PT Short Term Goal 4 - Progress (Week 1): Progressing toward goal PT Short Term Goal 5 (Week 1): Pt will initiate stair training PT Short Term Goal 5 - Progress (Week 1): Progressing toward goal Week 2:  PT Short Term Goal 1 (Week 2): Pt will ambulate >62ft per bout consistently with supervision using LRAD PT Short Term Goal 2 (Week 2): Pt will complete functional transfers using LRAD with supervision. PT Short Term Goal 3 (Week 2): Pt will demonstrate understanding of back precautions with 100% compliance. PT Short Term Goal 4 (Week 2): Pt will demonstrate bed mobility with MinA. PT Short Term Goal 5 (Week 2): Pt will initiate stair training.  Skilled Therapeutic Interventions/Progress Updates:  Patient in bathroom on entrance to room. Pts wife with questions re: pt's recent participation and progress. She is concerned re: LOA he will need in one week when her returns home and when she will have to return to work.   Patient alert and agreeable to PT session after toileting. Continued complaint of pain with headaches, and back pain this day.   Therapeutic Activity: Transfers: Provided supervision for wife assisting with completion of pericare and pt transfer to w/c from toilet. Patient performs all transfers this session using RW with CGA/  supervision. One instance of weak BLE unable to complete rise to stance and hard sit to w/c. Completes on 2nd attempt. Provided verbal cues for technique, positioning, and safety.  Gait Training:  Pt able to ambulate 25' x1 using RW with CGA/ close supervision. VC for upright posture to decrease downward pressure into walker and level gaze. Patient ambulated short distance in room for ambulatory transfer to recliner at end of session. Using RW and CGA. VC for positioning. Demonstrated flexed posture.  Patient seated  in recliner at end of session with brakes locked, seat alarm set, and all needs within reach.     Therapy Documentation Precautions:  Precautions Precautions: Back Precaution Booklet Issued: No Precaution Comments: Watch compliance with back precautions, self-limits d/t pain/ nausea Restrictions Weight Bearing Restrictions: No  Therapy/Group: Individual Therapy  Alger Simons PT, DPT 02/08/2021, 10:29 AM

## 2021-02-08 NOTE — Progress Notes (Signed)
Physical Therapy Session Note  Patient Details  Name: Louis [name] MRN: [id number] Date of Birth: 1966-01-29  Today's Date: 02/08/2021 PT Individual Time: 0932-6712 PT Individual Time Calculation (min): 16 min   Short Term Goals: Week 1:  PT Short Term Goal 1 (Week 1): Pt will ambulate 50ft minA PT Short Term Goal 2 (Week 1): Pt will complete sit to stand CGA PT Short Term Goal 3 (Week 1): Pt will be able to recall 3/3 back precautions PT Short Term Goal 4 (Week 1): Pt will be able to complete bed mobility MinA PT Short Term Goal 5 (Week 1): Pt will initiate stair training  Skilled Therapeutic Interventions/Progress Updates:  Patient seated upright in recliner on entrance to room. Patient alert and agreeable to PT session.  C/o high level pain at start of session that is causing high levels of nausea during session.  Education provided that pt needs to ambulate regularly in order to better manage inflammation and swelling at surgical site. This will in turn better manage pt's pain levels. Ambulation will need to occur daily and several times throughout day in order to better manage all of pt's related daily complaints that limit therapy participation.   Pt turns from hip to hip during time in room attempting to improve comfort. Pt reminded of spinal precautions and to roll side to side or shift to each side while keeping trunk in line with no bending in any direction or twisting.   Patient seated  in recliner at end of session with brakes locked, seat alarm set, and all needs within reach.     Therapy Documentation Precautions:  Precautions Precautions: Back Precaution Booklet Issued: No Precaution Comments: Pt unable to recall 3/3 back precautions Restrictions Weight Bearing Restrictions: No General: PT Amount of Missed Time (min): 29 Minutes PT Missed Treatment Reason: Patient ill (Comment);Patient unwilling to participate;Pain  Therapy/Group: Individual Therapy  Loel Dubonnet  PT, DPT 02/07/2021, 4:15 PM

## 2021-02-08 NOTE — Progress Notes (Addendum)
Occupational Therapy Weekly Progress Note  Patient Details  Name: Louis [name] MRN: [id number] Date of Birth: 07/02/1965  Beginning of progress report period: January 29, 2021 End of progress report period: February 08, 2021  Today's Date: 02/08/2021 OT Individual Time: 0930-1001 OT Individual Time Calculation (min): 31 min    Patient has met 1 of 4 short term goals.  Pt has made poor functional progress this week in OT. Pt cont to be primarily limited by ongoing nausea, dizziness, and significant BLE/back pain. Pt req significant amount of time to come into sitting EOB and has poor tolerance for OOB activities at this time, limiting his participation in therapy. Anticipate intermittent S and CGA to min A physical assist required upon DC for  ADL completion.   Patient continues to demonstrate the following deficits: muscle weakness, decreased cardiorespiratoy endurance, impaired timing and sequencing and decreased motor planning, peripheral, and decreased standing balance, decreased postural control, and decreased balance strategies and therefore will continue to benefit from skilled OT intervention to enhance overall performance with BADL, iADL, and Reduce care partner burden.  Patient progressing toward long term goals..  Continue plan of care.  OT Short Term Goals Week 2:  OT Short Term Goal 1 (Week 2): STG = LTG 2/2 ELOS  Skilled Therapeutic Interventions/Progress Updates:    Pt received on bedpan with wife present, agreeable to therapy after finishing up on bed pan. Returned later and pt agreeable. Session focus on self-care retraining, OOB activity tolerance, BLE strengthening, func transfers in prep for improved ADL/IADL/func mobility performance + decreased caregiver burden. Came to sitting EOB with min A to lift trunk via log roll. Stand-pivot with CGA + RW > w/c > toilet. Pt complete 4 min on kinetron for BLE strengthening at 30 cm/sec. Endorses fatigue and requesting to use bathroom. CGA  for LB clothing management. NT notified of pt position and pt educated to call if needed. Wife present in room.  Session 2: Attempted to see pt to make up missed minutes. Politely declining room level or out of room activity/ADL at this time. Pt left seated in recliner with wife present as found.  Therapy Documentation Precautions:  Precautions Precautions: Back Precaution Booklet Issued: No Precaution Comments: Pt unable to recall 3/3 back precautions Restrictions Weight Bearing Restrictions: No  Pain: ongoing back/BLE pain, did not rate   ADL: See Care Tool for more details.  Therapy/Group: Individual Therapy  Volanda Napoleon MS, OTR/L  02/08/2021, 6:37 AM

## 2021-02-08 NOTE — Progress Notes (Signed)
Pharmacy Antibiotic Note  Louis [name] is a 55 y.o. male admitted on 01/05/21 with epidural abscess with MRSA bacteremia/lumbar osteomyelitis for Vancomycin.  Transferred to Rehab on 01/28/21.   Last vancomycin levels were collected around 8/16 AM dose. Timed and collected appropriately.    Vancomycin peak = 23 mcg/ml.    Vancomycin trough = 13 mcg/ml.    AUC = 458, remains in goal range of 400-550  Serum creatinine continues to be stable, ~0.4, last checked 8/14. Dose still appropriate. Next levels planned for Tuesday   Plan: Continue Vancomycin 1500 mg q 12 hours Vancomycin planned thru 03/10/21 Will plan to check Vanc levels weekly or sooner if needed. Monitor renal function at least q 72 hours Follow clinical progress.    Height: 5\' 11"  (180.3 cm) Weight: 102.7 kg (226 lb 6.6 oz) IBW/kg (Calculated) : 75.3  Temp (24hrs), Avg:98.3 F (36.8 C), Min:98.1 F (36.7 C), Max:98.6 F (37 C)  Recent Labs  Lab 02/03/21 0608 02/06/21 0336 02/07/21 0356 02/08/21 0743 02/08/21 1348  WBC  --   --  7.0  --   --   CREATININE 0.41* 0.39*  --   --   --   VANCOTROUGH  --   --   --  13*  --   VANCOPEAK  --   --   --   --  23*     Estimated Creatinine Clearance: 124.4 mL/min (A) (by C-G formula based on SCr of 0.39 mg/dL (L)).    No Known Allergies  Cubicin PTA >> 7/14 (initial plan through 8/10, last OPAT 7/13); 7/20 >>7/21 Ceftaroline 7/20 >> 8/5 Vanc 7/14 >>7/20; 7/21>> (9/15) Fluconazole 7/17>> 7/23 for oral thrush  7/18 VP 22, VT 9 (drawn late), AUC 385 on 1500 q12 >> incr 1250 q8 7/23: VP 28, VT 21,  AUC 575 >> change to 1750/12h for AUC 536 7/26-27: VP 31, VT 14 (drawn early), AUC 523 >> con't 1750mg  q12 8/4-8/5: VP 33, VT 22 ( both drawn early)- AUC 551>>Continue 1500 q 12 for now  8/9 VP 27, VT 14 (on time) - AUC 492, continue 1500 mg IV q12h 8/16 VP23, VT 13-AUC 458, continue 1500mg  IV q12h   7/13 BCx - 3/4 MRSA 7/15 Bcx: staph aureus 7/16 wound cx - neg 7/17 Bcx- 3/4  MRSA 7/19 Bcx: MRSA 7/20 Bcx: staph aureus 7/22 Bcx - negative 7/24 Bcx - NGF 7/26 Bcx - NGF  Radin Raptis A. 8/22, PharmD, BCPS, [facility] Clinical Pharmacist [place] Please utilize Amion for appropriate phone number to reach the unit pharmacist [facility])  02/08/2021, 3:54 PM

## 2021-02-08 NOTE — Progress Notes (Signed)
Physical Therapy Weekly Progress Note  Patient Details  Name: Louis [name] MRN: [id number] Date of Birth: Oct 28, 1965  Beginning of progress report period: January 29, 2021 End of progress report period: February 07, 2021  Today's Date: 02/07/2021  Patient has met 3 of 5 short term goals.  Pt making very slow progress towards goals and may need add'l time to meet LTG due to barriers from high pain levels and nausea preventing complete participation in scheduled therapy sessions. He continues to require physical assist to complete bed mobility d/t pain, functional transfers with CGA/ close supervision dependent on fatigue/ weakness in BLE from pain, and completes short distance ambulation using RW and CGA. Has not yet progressed to stair training. He continues to self limit daily d/t pain and nausea 2/2 pain (despite continued education), and is limited by low activity tolerance as wellas generalized and BLE weakness.   Patient continues to demonstrate the following deficits muscle weakness, decreased cardiorespiratoy endurance, unbalanced muscle activation, ,, and decreased sitting balance, decreased standing balance, decreased postural control, decreased balance strategies, difficulty maintaining precautions, and increased pain in back and LE  and therefore will continue to benefit from skilled PT intervention to increase functional independence with mobility.  Patient very slowly progressing toward long term goals..  Continue plan of care.  PT Short Term Goals Week 1:  PT Short Term Goal 1 (Week 1): Pt will ambulate 55ft minA PT Short Term Goal 1 - Progress (Week 1): Met PT Short Term Goal 2 (Week 1): Pt will complete sit to stand CGA PT Short Term Goal 2 - Progress (Week 1): Met PT Short Term Goal 3 (Week 1): Pt will be able to recall 3/3 back precautions PT Short Term Goal 3 - Progress (Week 1): Met PT Short Term Goal 4 (Week 1): Pt will be able to complete bed mobility MinA PT Short Term Goal 4 -  Progress (Week 1): Progressing toward goal PT Short Term Goal 5 (Week 1): Pt will initiate stair training PT Short Term Goal 5 - Progress (Week 1): Progressing toward goal Week 2:  PT Short Term Goal 1 (Week 2): Pt will ambulate >67ft per bout consistently with supervision using LRAD PT Short Term Goal 2 (Week 2): Pt will complete functional transfers using LRAD with supervision. PT Short Term Goal 3 (Week 2): Pt will demonstrate understanding of back precautions with 100% compliance. PT Short Term Goal 4 (Week 2): Pt will demonstrate bed mobility with MinA. PT Short Term Goal 5 (Week 2): Pt will initiate stair training.  Skilled Therapeutic Interventions/Progress Updates:  Ambulation/gait training;Discharge planning;Functional mobility training;Psychosocial support;Therapeutic Activities;Visual/perceptual remediation/compensation;Balance/vestibular training;Disease management/prevention;Neuromuscular re-education;Skin care/wound management;Therapeutic Exercise;Wheelchair propulsion/positioning;Cognitive remediation/compensation;DME/adaptive equipment instruction;Pain management;Splinting/orthotics;UE/LE Strength taining/ROM;Community reintegration;Patient/family education;Functional electrical stimulation;Stair training;UE/LE Coordination activities    Therapy Documentation Precautions:  Precautions Precautions: Back Precaution Booklet Issued: No Precaution Comments: Watch compliance with back precautions, self-limits d/t pain/ nausea Restrictions Weight Bearing Restrictions: No  Therapy/Group: Individual Therapy  Louis [name] 02/07/2021, 5:31 PM

## 2021-02-08 NOTE — Progress Notes (Signed)
Physical Therapy Session Note  Patient Details  Name: Louis [name] MRN: [id number] Date of Birth: 09-13-1965  Today's Date: 02/08/2021 PT Individual Time: 6226-3335 PT Individual Time Calculation (min): 30 min   Short Term Goals: Week 2:     Skilled Therapeutic Interventions/Progress Updates: Pt presents sitting reclined in the recliner, but agreeable to therapy.  Pt performed LE there ex sitting for calf raises, LAQ, hip flexion and then standing knee flex/marching 3 x 10.  Pt given manual resistance for hip extension in sitting.  Pt performed multiple sit to stand transfers w/ CGA and verbal cues for breathing techniques.  Pt amb in room x 18' w/ RW and CGA including turns to return to seat.  Pt had episode of L knee buckling when approaching seat and assisted to sit in recliner.  Pt fearful of falling 2/2 knee buckling.  Pt remained sitting in recliner w/ family member present, all needs in reach.     Therapy Documentation Precautions:  Precautions Precautions: Back Precaution Booklet Issued: No Precaution Comments: Pt unable to recall 3/3 back precautions Restrictions Weight Bearing Restrictions: No General:   Vital Signs:   Pain:7/10 low back, w/ pain meds. Pain Assessment Pain Score: 5  Faces Pain Scale: Hurts a little bit PAINAD (Pain Assessment in Advanced Dementia) Breathing: normal Negative Vocalization: none Facial Expression: smiling or inexpressive Body Language: relaxed Mobility:       Therapy/Group: Individual Therapy  [name] 02/08/2021, 1:36 PM

## 2021-02-09 DIAGNOSIS — R5381 Other malaise: Secondary | ICD-10-CM | POA: Diagnosis not present

## 2021-02-09 LAB — C-REACTIVE PROTEIN: CRP: 7.6 mg/dL — ABNORMAL HIGH (ref <1.0)

## 2021-02-09 LAB — BASIC METABOLIC PANEL
Anion gap: 7 (ref 5–15)
BUN: <5 mg/dL — ABNORMAL LOW (ref 6–20)
CO2: 25 mmol/L (ref 22–32)
Calcium: 8.4 mg/dL — ABNORMAL LOW (ref 8.9–10.3)
Chloride: 97 mmol/L — ABNORMAL LOW (ref 98–111)
Creatinine, Ser: 0.35 mg/dL — ABNORMAL LOW (ref 0.61–1.24)
GFR, Estimated: >60 mL/min (ref >60)
Glucose, Bld: 166 mg/dL — ABNORMAL HIGH (ref 70–99)
Potassium: 3.5 mmol/L (ref 3.5–5.1)
Sodium: 129 mmol/L — ABNORMAL LOW (ref 135–145)

## 2021-02-09 LAB — GLUCOSE, CAPILLARY
Glucose-Capillary: 227 mg/dL — ABNORMAL HIGH (ref 70–99)
Glucose-Capillary: 204 mg/dL — ABNORMAL HIGH (ref 70–99)
Glucose-Capillary: 104 mg/dL — ABNORMAL HIGH (ref 70–99)
Glucose-Capillary: 116 mg/dL — ABNORMAL HIGH (ref 70–99)

## 2021-02-09 LAB — CK: Total CK: 24 U/L — ABNORMAL LOW (ref 49–397)

## 2021-02-09 MED ORDER — AMLODIPINE BESYLATE 10 MG PO TABS
10.0000 mg | ORAL_TABLET | Freq: Every day | ORAL | Status: DC
  Administered 2021-02-10 – 2021-02-20 (×11): 10 mg via ORAL
  Filled 2021-02-09 (×11): qty 1

## 2021-02-09 MED ORDER — GABAPENTIN 100 MG PO CAPS
100.0000 mg | ORAL_CAPSULE | Freq: Two times a day (BID) | ORAL | Status: DC
  Administered 2021-02-09 – 2021-02-19 (×21): 100 mg via ORAL
  Filled 2021-02-09 (×21): qty 1

## 2021-02-09 MED ORDER — LOSARTAN POTASSIUM 25 MG PO TABS
12.5000 mg | ORAL_TABLET | Freq: Every morning | ORAL | Status: DC
  Administered 2021-02-09 – 2021-02-17 (×9): 12.5 mg via ORAL
  Filled 2021-02-09 (×9): qty 0.5

## 2021-02-09 NOTE — Progress Notes (Signed)
Pt c/o pain to rear end and RLE with touch. Nurse observed 1+ edema to BIL LE. Red area on sacrum. Voltaren cream applied to RLE and foam placed on sacrum. Will continue to monitor

## 2021-02-09 NOTE — Progress Notes (Signed)
Physical Therapy Session Note  Patient Details  Name: Louis [name] MRN: [id number] Date of Birth: [birth year]  Today's Date: 02/09/2021 PT Individual Time: 0093-8182 PT Individual Time Calculation (min): 38 min   Short Term Goals:  Week 2:  PT Short Term Goal 1 (Week 2): Pt will ambulate >26f per bout consistently with supervision using LRAD PT Short Term Goal 2 (Week 2): Pt will complete functional transfers using LRAD with supervision. PT Short Term Goal 3 (Week 2): Pt will demonstrate understanding of back precautions with 100% compliance. PT Short Term Goal 4 (Week 2): Pt will demonstrate bed mobility with MinA. PT Short Term Goal 5 (Week 2): Pt will initiate stair training.   Skilled Therapeutic Interventions/Progress Updates:   Pt received supine in bed and agreeable to PT. Applies Bil thigh high teds with total A. Supine>sit transfer with increased time and supervision assist with cues for log roll technique. [facility] Pt reports increasing Nausea with multiple episodes of near emesis. Pt requesting gingerale to settle stomach. After prolonged rest break, pt requesting to return room. Patient returned to room and performed stand pivot to recliner with RW and CGA-min A. Pt left sitting in recliner with call bell in reach and all needs met.          Therapy Documentation Precautions:  Precautions Precautions: Back Precaution Booklet Issued: No Precaution Comments: Watch compliance with back precautions, self-limits d/t pain/ nausea Restrictions Weight Bearing Restrictions: No   Pain: Pain Assessment Pain Scale: 0-10 Pain Score: 10-Worst pain ever Faces Pain Scale: Hurts a little bit Pain Type: Acute pain Pain Location: Generalized Pain Descriptors / Indicators: Aching;Sore Pain Frequency: Constant    Therapy/Group: Individual Therapy  [name]8/17/2022, 6:00 PM

## 2021-02-09 NOTE — Progress Notes (Signed)
Occupational Therapy Session Note  Patient Details  Name: Louis [name] MRN: [id number] Date of Birth: 1966/06/20  Today's Date: 02/09/2021 OT Individual Time: 1129-1203 OT Individual Time Calculation (min): 34 min + 33 min   Short Term Goals: Week 2:  OT Short Term Goal 1 (Week 2): STG = LTG 2/2 ELOS  Skilled Therapeutic Interventions/Progress Updates:    Session 1 ([id number]): Pt received semi-reclined in bed with LPN present, agreeable to therapy and req to shower. Session focus on self-care retraining, activity tolerance, func transfers in prep for improved ADL/IADL/func mobility performance + decreased caregiver burden. Came to sitting EOB with CGA via log roll. Unable to don B gripper socks requiring total A. Stand-pivot with RW > w/c > TTB with CGA. SW present for conference update. Pt doffed shorts with mod A to remove from lower BLE, able to pull down over hips via lateral leans. Pt suddenly c/o nausea, LPN notified to provide rx. Pt declining to attempt shower and completed stand-pivot > w/c > bed same manner as before after increased time to rest. Returned to supine with min A to progress BLE onto bed. Pt also c/o raised bumps on B hands, LPN present and to notify MD.  Pt left semi-reclined in bed with BLE elevated with NT and wife present, call bell in reach, and all immediate needs met.   Session 2 [phone]): Pt received semi-reclined in bed, c/o ongoing nausea/back pain but, agreeable to therapy with encouragement. Session focus on self-care retraining, OOB activity tolerance, back precaution/AE education, func transfers in prep for improved ADL/IADL/func mobility performance + decreased caregiver burden. Pt agreeable to sponge bath at sink vs shower. Came to sitting EOB with multiple tries and CGA via log roll. Stand-pivot with RW > w/c with CGA for balance d/t ongoing nausea. Pt completed UBB and facial grooming with set-up A at sink. Req mod A to bathe LB 2/2 back precautions, pt  unable to achieve figure four position. Will provide LH sponge next session. Pt requesting privacy for LB bathing with wife to assist. Provided education to wife for transfer training, safe w/c management, and back precautions. Verbalized understanding and will call nurse bell if assist need. Pt req nausea medication - LPN notified. Pt left in w/c with wife present, call bell in reach, and all immediate needs met.    Therapy Documentation Precautions:  Precautions Precautions: Back Precaution Booklet Issued: No Precaution Comments: Pt unable to recall 3/3 back precautions Restrictions Weight Bearing Restrictions: No  Pain: ongoing back pain, did not rate   ADL: See Care Tool for more details.  Therapy/Group: Individual Therapy  Volanda Napoleon MS, OTR/L  02/09/2021, 6:40 AM

## 2021-02-09 NOTE — Progress Notes (Signed)
Physical Therapy Session Note  Patient Details  Name: Elian Gloster MRN: [id number] Date of Birth: 12/26/1965  Today's Date: 02/09/2021 PT Individual Time: 1015-1045 PT Individual Time Calculation (min): 30 min   Short Term Goals: Week 2:  PT Short Term Goal 1 (Week 2): Pt will ambulate >63f per bout consistently with supervision using LRAD PT Short Term Goal 2 (Week 2): Pt will complete functional transfers using LRAD with supervision. PT Short Term Goal 3 (Week 2): Pt will demonstrate understanding of back precautions with 100% compliance. PT Short Term Goal 4 (Week 2): Pt will demonstrate bed mobility with MinA. PT Short Term Goal 5 (Week 2): Pt will initiate stair training.  Skilled Therapeutic Interventions/Progress Updates: Pt presented in bed agreeable to therapy. Pt states pain currently approx 3/10, recently recently medicated. Pt states noted increased swelling today which has been fluctuating however more present today. Pt agreeable to supine therex due to short session. [facility] and mod I. Pt performed DF x 10 bilaterally, SAQ x 10 bilaterally with PTA assist to complete full range, AA SLR x 10 bilaterally with verbal cues to not hold breath during exertion, and heel slides x 10 bilaterally.  PTA also performed manual hamstring stretch 30 sec x 3. Pt left in bed at end of session with call bed within reach and needs met.      Therapy Documentation Precautions:  Precautions Precautions: Back Precaution Booklet Issued: No Precaution Comments: Watch compliance with back precautions, self-limits d/t pain/ nausea Restrictions Weight Bearing Restrictions: No General:   Vital Signs:  Pain: Pain Assessment Pain Scale: 0-10 Pain Score: 2  Faces Pain Scale: Hurts a little bit Pain Type: Other (Comment) Pain Location: Buttocks Pain Orientation: Left;Right Pain Radiating Towards: BIL LE Pain Descriptors / Indicators: Sore Pain  Frequency: Constant Pain Onset: On-going Patients Stated Pain Goal: 0 Pain Intervention(s): Repositioned Multiple Pain Sites: Yes Mobility:   Locomotion :    Trunk/Postural Assessment :    Balance:   Exercises:   Other Treatments:      Therapy/Group: Individual Therapy  Donielle Radziewicz 02/09/2021, 12:30 PM

## 2021-02-09 NOTE — Progress Notes (Signed)
Patient ID: Louis [name], male   DOB: 10-03-1965, 55 y.o.   MRN: [id number]  SW covering for primary [place].   SW met with pt and pt wife in room to provide updates from team conference, and d/c remains 8/25. Fam edu scheduled for 8/23 9:30am-12pm/1pm-3pm.Pt and wife aware Margreta Journey will follow-up.   Loralee Pacas, MSW, [place] Office: [phone] Cell: [phone] Fax: [phone]

## 2021-02-09 NOTE — Patient Care Conference (Signed)
Inpatient RehabilitationTeam Conference and Plan of Care Update Date: 02/09/2021   Time: 10:02 AM    Patient Name: Louis [name]      Medical Record Number: [id number]  Date of Birth: [birth year] Sex: Male         Room/Bed: 4W02C/4W02C-01 Payor Info: Payor: [employer] / Plan: [employer] PPO / Product Type: *No Product type* /    Admit Date/Time:  01/28/2021  6:24 PM  Primary Diagnosis:  Debility  Hospital Problems: Principal Problem:   Debility Active Problems:   Epidural abscess   Pressure injury of skin   Chronic diastolic congestive heart failure (HCC)   Uncontrolled type 2 diabetes mellitus with hyperglycemia (HCC)   Acute midline low back pain without sciatica   Labile blood pressure    Expected Discharge Date: Expected Discharge Date: 02/17/21  Team Members Present: Physician leading conference: Dr. Claudette Laws Social Worker Present: Cecile Sheerer, LCSWA Nurse Present: Chana Bode, RN PT Present: Grier Rocher, PT OT Present: Annye English, OT SLP Present: Eilene Ghazi, SLP PPS Coordinator present : Fae Pippin, SLP     Current Status/Progress Goal Weekly Team Focus  Bowel/Bladder   Continent of Bowel and Bladder, LBM 8/13  Remain continent of B/B  Assess Q shift and assist with toileting needs.   Swallow/Nutrition/ Hydration             ADL's   mod I with urinal, S seated grooming EOB, UBD, UBB EOB, CGA stand-pivot toilet transfer with RW, min A for toileting tasks at toilet,  max to total A LBD; significantly limited by nausea/BLE/back pain past week impairing therapy participation  S for showering/bathroom transfers, toileting, light meal prep, mod I remaining ADL  self-care/balance/transfer retraining, pt/family/DME/AE education, energy conservation   Mobility   Not much progress ::: Bed mobility = up to Mod A especially for technique and maintaining spinal precautions, transfers = CGA, ambulation = CGA with RW for short distances;  severe increases in LBP/ surgical pain with radiating symptoms in to BLE affecting mobility, activity tolerance, strength, no stair training yet  overall Mod I/ supervision  Continued focus on increasing activity toerance/ strength, decreasing pain, general strengthening, increaing time in ambulation/ activity, improving LOA required for all mobility - wife concerned re: current LOA and pt's self limiting behavior   Communication             Safety/Cognition/ Behavioral Observations            Pain   Reports pain to back and legs. Managed with oxycodone and tramadol  Pain <3/10. Assess Q shift and prn.      Skin   Stage 1 sacral wound  Promote healing, no further episodes of skin impairment.  Assess Q shift.     Discharge Planning:  Discharging home with spouse able to provide superivison to Min A, 24/7   Team Discussion: BP remains labile. Pain is better, but given myalgias noted with ?myositis; MD to hold statin for now. Pain in back that radiates to buttocks and down right leg with bil LE edema.Slow steady progress with participation limited by pain. Patient on target to meet rehab goals: yes, currently CGA for toileting with RW. Requires supervision for scoot to EOB for bathing and dressing. Requires max assist for lower body dressing. Patient able to ambulate for short distance with CGA-min assist.  Goals for discharge set for supervision level.  *See Care Plan and progress notes for long and short-term goals.   Revisions to  Treatment Plan:  Downgraded goals to supervision and ambulation less than 100'; household level with supervision - CGA.   Teaching Needs: Skin care, medications, IV abx, transfers, etc.  Current Barriers to Discharge: Decreased caregiver support, Home enviroment access/layout, and IV antibiotics  Possible Resolutions to Barriers: Family education     Medical Summary Current Status: IV Vanc, myalgias on statin, pain c/os, vomiting improved  Barriers to  Discharge: Medical stability   Possible Resolutions to Barriers/Weekly Focus: pain med adjustment , BP med adjustment   Continued Need for Acute Rehabilitation Level of Care: The patient requires daily medical management by a physician with specialized training in physical medicine and rehabilitation for the following reasons: Direction of a multidisciplinary physical rehabilitation program to maximize functional independence : Yes Medical management of patient stability for increased activity during participation in an intensive rehabilitation regime.: Yes Analysis of laboratory values and/or radiology reports with any subsequent need for medication adjustment and/or medical intervention. : Yes   I attest that I was present, lead the team conference, and concur with the assessment and plan of the team.   Chana Bode B 02/09/2021, 12:31 PM

## 2021-02-09 NOTE — Progress Notes (Signed)
Pt c/o painful small bumps in the palms of hands and on finger tips. Nurse observed 1x1cm raised bumps in palms and fingers. No SOB, swelling observed. MD notified

## 2021-02-09 NOTE — Progress Notes (Signed)
PROGRESS NOTE   Subjective/Complaints: C/o tenderness "all over"  arms and legs Took oxy IR 3 tab yest, tramadol 3 tab yest  ROS: Denies CP, SOB, N/V/D  Objective:   No results found. Recent Labs    02/07/21 0356  WBC 7.0  HGB 7.8*  HCT 24.7*  PLT 472*    Recent Labs    02/09/21 0420  NA 129*  K 3.5  CL 97*  CO2 25  GLUCOSE 166*  BUN <5*  CREATININE 0.35*  CALCIUM 8.4*     Intake/Output Summary (Last 24 hours) at 02/09/2021 0843 Last data filed at 02/09/2021 8182 Gross per 24 hour  Intake 2130.02 ml  Output 1400 ml  Net 730.02 ml      Pressure Injury 01/28/21 Coccyx Left Stage 2 -  Partial thickness loss of dermis presenting as a shallow open injury with a red, pink wound bed without slough. pink, moist (Active)  01/28/21 1845  Location: Coccyx  Location Orientation: Left  Staging: Stage 2 -  Partial thickness loss of dermis presenting as a shallow open injury with a red, pink wound bed without slough.  Wound Description (Comments): pink, moist  Present on Admission: Yes    Physical Exam: Vital Signs Blood pressure (!) 149/90, pulse 89, temperature 98.3 F (36.8 C), temperature source Oral, resp. rate 16, height $RemoveBe'5\' 11"'yDIpNuPxz$  (1.803 m), weight 102.7 kg, SpO2 100 %.  General: No acute distress Mood and affect are appropriate Heart: Regular rate and rhythm no rubs murmurs or extra sounds Lungs: Clear to auscultation, breathing unlabored, no rales or wheezes Abdomen: Positive bowel sounds, soft nontender to palpation, nondistended Extremities: No clubbing, cyanosis, or edema Skin: No evidence of breakdown, no evidence of rash MSK- tenderness to light palpation Bilateral arms forearms , thighs and calves  Mood and affect approp Neuro: Alert and oriented Motor: Grossly 4/5 throughout, unchanged  Assessment/Plan: 1. Functional deficits which require 3+ hours per day of interdisciplinary therapy in a  comprehensive inpatient rehab setting. Physiatrist is providing close team supervision and 24 hour management of active medical problems listed below. Physiatrist and rehab team continue to assess barriers to discharge/monitor patient progress toward functional and medical goals  Care Tool:  Bathing  Bathing activity did not occur: Refused           Bathing assist       Upper Body Dressing/Undressing Upper body dressing   What is the patient wearing?: Pull over shirt    Upper body assist Assist Level: Minimal Assistance - Patient > 75%    Lower Body Dressing/Undressing Lower body dressing      What is the patient wearing?: Pants, Underwear/pull up     Lower body assist Assist for lower body dressing: Total Assistance - Patient < 25%     Toileting Toileting    Toileting assist Assist for toileting: Independent with assistive device Assistive Device Comment: Urinal   Transfers Chair/bed transfer  Transfers assist     Chair/bed transfer assist level: Minimal Assistance - Patient > 75% (stand pivot with RW)     Locomotion Ambulation   Ambulation assist      Assist level: Contact Guard/Touching assist Assistive device:  Walker-rolling Max distance: 18   Walk 10 feet activity   Assist  Walk 10 feet activity did not occur: Safety/medical concerns  Assist level: Contact Guard/Touching assist Assistive device: Walker-rolling   Walk 50 feet activity   Assist Walk 50 feet with 2 turns activity did not occur: Safety/medical concerns  Assist level: Total Assistance - Patient < 25%      Walk 150 feet activity   Assist Walk 150 feet activity did not occur: Safety/medical concerns         Walk 10 feet on uneven surface  activity   Assist Walk 10 feet on uneven surfaces activity did not occur: Safety/medical concerns         Wheelchair     Assist Will patient use wheelchair at discharge?: No Type of Wheelchair: Manual     Wheelchair assist level: Contact Guard/Touching assist Max wheelchair distance: 150    Wheelchair 50 feet with 2 turns activity    Assist        Assist Level: Contact Guard/Touching assist   Wheelchair 150 feet activity     Assist      Assist Level: Contact Guard/Touching assist   Blood pressure (!) 149/90, pulse 89, temperature 98.3 F (36.8 C), temperature source Oral, resp. rate 16, height $RemoveBe'5\' 11"'JeHuJBByU$  (1.803 m), weight 102.7 kg, SpO2 100 %.  Medical Problem List and Plan: 1.  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   Continue CIR PT OT Team conference today please see physician documentation under team conference tab, met with team  to discuss problems,progress, and goals. Formulized individual treatment plan based on medical history, underlying problem and comorbidities.  2.  Antithrombotics: -DVT/anticoagulation:  Pharmaceutical: Lovenox- don't anticipate need at discharge              -antiplatelet therapy: Baby aspirin daily 3. Pain Management: OxyContin 10 mg every 12 hours with oxycodone 5 mg every 4 hours as needed             -Robaxin 500 every 6 as needed ordered             -Voltaren gel for topical relief             -Added K pad for muscle spasm and low back pain             -Tylenol for mild pain -Given Toradol IM x2 days as needed, did great with this, will try low dose celebrex $RemoveBefor'100mg'SxueQRuPhnTa$  daily to reduce need for narcotic   4. Mood: Team to provide ego support             -continue Effexor XR 75 mg daily             -antipsychotic agents: N/A, had been haldol before 5. Neuropsych: This patient is capable of making decisions on his own behalf. -Patient's mental status appears to be improving  6. Skin/Wound Care: Maximize nutritional intake, local skin care as needed 7. Fluids/Electrolytes/Severe Protein malutrition: Patient now is on regular diet.  Encourage p.o. intake. 8.  Sepsis secondary to  discitis and epidural abscess/bacteremia             -continue IV vancomycin through 9/15             -Monitor clinically and with regular labs 9.  Uncontrolled type 2 diabetes             -Lantus insulin 5 units with sliding scale insulin coverage--increased to 10  units 8/6, increased to 15 on 8/12  CBG (last 3)  Recent Labs    02/08/21 1614 02/08/21 2110 02/09/21 0627  GLUCAP 150* 162* 227*   Still labile , increase lantus to 20U  10.  Chronic diastolic congestive heart failure             -Monitor daily weights             -Anasarca most likely due to nutritional status as opposed to cardiac             -Recent 2D echo demonstrated no wall abnormalities   Filed Weights   01/31/21 0400 02/02/21 0630 02/03/21 0529  Weight: 98.3 kg 102.9 kg 102.7 kg   Stable on 8/11 11.  Hyponatremia- off Free H20  Sodium 129 on 8/14, stable 8/17 asymptomatic              -Hypokalemia, improved , off lasix    12. Anemia d/t illness  Hemoglobin 7.6 on 8/4, stable at 7.8 on 8/15 13. Thrombocytosis:  Platelets 526 on 8/4, improving  476K on 8/15 14.  Blood pressure   Vitals:   02/08/21 1949 02/09/21 0436  BP: (!) 155/90 (!) 149/90  Pulse: 97 89  Resp: 18 16  Temp: 98.7 F (37.1 C) 98.3 F (36.8 C)  SpO2: 100% 100%  8/17 mildly elevated cont to monitor on Losartan $RemoveBef'25mg'JrjiUZeJKG$  , Toprol XL $RemoveB'25mg'VQsLJlvK$  , Norvasc $RemoveB'5mg'zuMeYUry$   Will increase amlodipine to $RemoveBefor'10mg'vOXBRylfWolE$  reduce losartan to 12.5 mg 15.  Myalgia - on statin will hold and check CK LOS: 12 days A FACE TO [place] E Kylena Mole 02/09/2021, 8:43 AM

## 2021-02-10 DIAGNOSIS — R5381 Other malaise: Secondary | ICD-10-CM | POA: Diagnosis not present

## 2021-02-10 LAB — GLUCOSE, CAPILLARY
Glucose-Capillary: 159 mg/dL — ABNORMAL HIGH (ref 70–99)
Glucose-Capillary: 195 mg/dL — ABNORMAL HIGH (ref 70–99)
Glucose-Capillary: 302 mg/dL — ABNORMAL HIGH (ref 70–99)
Glucose-Capillary: 118 mg/dL — ABNORMAL HIGH (ref 70–99)

## 2021-02-10 MED ORDER — HYDROCORTISONE 0.5 % EX CREA
TOPICAL_CREAM | Freq: Two times a day (BID) | CUTANEOUS | Status: DC
  Filled 2021-02-10: qty 28.35

## 2021-02-10 NOTE — Progress Notes (Signed)
Physical Therapy Session Note  Patient Details  Name: Louis [name] MRN: [id number] Date of Birth: 19-May-1966  Today's Date: 02/10/2021    Short Term Goals: Week 2:  PT Short Term Goal 1 (Week 2): Pt will ambulate >98ft per bout consistently with supervision using LRAD PT Short Term Goal 2 (Week 2): Pt will complete functional transfers using LRAD with supervision. PT Short Term Goal 3 (Week 2): Pt will demonstrate understanding of back precautions with 100% compliance. PT Short Term Goal 4 (Week 2): Pt will demonstrate bed mobility with MinA. PT Short Term Goal 5 (Week 2): Pt will initiate stair training.  Skilled Therapeutic Interventions/Progress Updates: Pt presented in bed eating lunch agreeable to return in a few minutes after eating sandwich. PTA returned with pt expressing that he's frustrated because he has not received his pain meds he requested "an hour ago", PTA located nurse who stated would be in room with meds once completed with current pt. PTA returned to room providing information with pt then stating he was not able to participate due to pain. PTA encouraged at least sitting up at EOB to take meds with pt refusing. Pt left in bed with nsg and wife present and current needs met.   Pt missed 45 min skilled PT due to pain/refusal     Therapy Documentation Precautions:  Precautions Precautions: Back Precaution Booklet Issued: No Precaution Comments: Watch compliance with back precautions, self-limits d/t pain/ nausea Restrictions Weight Bearing Restrictions: No General: PT Amount of Missed Time (min): 45 Minutes    Therapy/Group: Individual Therapy  Christal Lagerstrom Quin Mathenia, PTA  02/10/2021, 1:40 PM

## 2021-02-10 NOTE — Progress Notes (Signed)
Occupational Therapy Session Note  Patient Details  Name: Louis [name] MRN: [id number] Date of Birth: April 12, 1966  Today's Date: 02/10/2021 OT Individual Time: 9371-6967 OT Individual Time Calculation (min): 41 min    Short Term Goals: Week 2:  OT Short Term Goal 1 (Week 2): STG = LTG 2/2 ELOS  Skilled Therapeutic Interventions/Progress Updates:    Pt received semi-reclined in bed with wife/LPN present, agreeable to therapy. Session focus on self-care retraining, OOB activity tolerance, func transfers in prep for improved ADL/IADL/func mobility performance + decreased caregiver burden. Pt req to use bathroom. Multiple attempts to transition to sitting EOB 2/2 L foot "muscle spasm." Min A to lift trunk via log roll. Stand-pivot with RW and CGA > w/c. Transported w/c to outside of bathroom , short distance ambulation > toilet with CGA + RW. Sat down on toilet prior to doffing shorts 2/2 fatigue. Req min A to power up from low surface, but pt electing to not use 3in1. Total A for LB clothing management 2/2 pt feeling ill. Pt req extended time on toilet, unable to void bowel. LPN notified. Pt left on toilet with wife present. Educated wife/pt to call for assist, wife cleared for stand-pivot transfers if w/c brought to bathroom. Wife verbalized understanding LPN made aware of pt position.     Therapy Documentation Precautions:  Precautions Precautions: Back Precaution Booklet Issued: No Precaution Comments: Watch compliance with back precautions, self-limits d/t pain/ nausea Restrictions Weight Bearing Restrictions: No  Pain:  Ongoing back pain, did not rate ADL: See Care Tool for more details.  Therapy/Group: Individual Therapy  Claudie Revering MS, OTR/L  02/10/2021, 6:40 AM

## 2021-02-10 NOTE — Progress Notes (Signed)
PROGRESS NOTE   Subjective/Complaints: CK normal  Pt has noted small pink bumps on palms and fingers, he feels they are sore but not itchy.  Started on Gabapentin yesterday but states he has been on this in past without issue Pain control improving  ROS: Denies CP, SOB, N/V/D  Objective:   No results found. No results for input(s): WBC, HGB, HCT, PLT in the last 72 hours.  Recent Labs    02/09/21 0420  NA 129*  K 3.5  CL 97*  CO2 25  GLUCOSE 166*  BUN <5*  CREATININE 0.35*  CALCIUM 8.4*     Intake/Output Summary (Last 24 hours) at 02/10/2021 6378 Last data filed at 02/10/2021 0700 Gross per 24 hour  Intake 1080 ml  Output 1325 ml  Net -245 ml      Pressure Injury 01/28/21 Coccyx Left Stage 2 -  Partial thickness loss of dermis presenting as a shallow open injury with a red, pink wound bed without slough. pink, moist (Active)  01/28/21 1845  Location: Coccyx  Location Orientation: Left  Staging: Stage 2 -  Partial thickness loss of dermis presenting as a shallow open injury with a red, pink wound bed without slough.  Wound Description (Comments): pink, moist  Present on Admission: Yes    Physical Exam: Vital Signs Blood pressure (!) 141/93, pulse 91, temperature 98.6 F (37 C), temperature source Oral, resp. rate 16, height $RemoveBe'5\' 11"'CDMSQwKfs$  (1.803 m), weight 102.7 kg, SpO2 97 %.  General: No acute distress Mood and affect are appropriate Heart: Regular rate and rhythm no rubs murmurs or extra sounds Lungs: Clear to auscultation, breathing unlabored, no rales or wheezes Abdomen: Positive bowel sounds, soft nontender to palpation, nondistended Extremities: No clubbing, cyanosis, or edema Skin: No evidence of breakdown, 0.5-1cm papules mainly at base of both palms and Right distal lateral 5th digit, no discharge, non tender, skin appears dry MSK- tenderness to light palpation Bilateral arms forearms , thighs and calves   Mood and affect approp Neuro: Alert and oriented Motor: Grossly 4/5 throughout, unchanged  Assessment/Plan: 1. Functional deficits which require 3+ hours per day of interdisciplinary therapy in a comprehensive inpatient rehab setting. Physiatrist is providing close team supervision and 24 hour management of active medical problems listed below. Physiatrist and rehab team continue to assess barriers to discharge/monitor patient progress toward functional and medical goals  Care Tool:  Bathing  Bathing activity did not occur: Refused Body parts bathed by patient: Right arm, Left arm, Chest, Abdomen, Right upper leg, Left upper leg, Face   Body parts bathed by helper: Front perineal area, Buttocks, Right lower leg, Left lower leg     Bathing assist Assist Level: Moderate Assistance - Patient 50 - 74%     Upper Body Dressing/Undressing Upper body dressing   What is the patient wearing?: Pull over shirt    Upper body assist Assist Level: Supervision/Verbal cueing    Lower Body Dressing/Undressing Lower body dressing      What is the patient wearing?: Pants, Underwear/pull up     Lower body assist Assist for lower body dressing: Maximal Assistance - Patient 25 - 49%     Toileting Toileting  Toileting assist Assist for toileting: Independent with assistive device Assistive Device Comment: Urinal   Transfers Chair/bed transfer  Transfers assist     Chair/bed transfer assist level: Minimal Assistance - Patient > 75% (stand pivot with RW)     Locomotion Ambulation   Ambulation assist      Assist level: Contact Guard/Touching assist Assistive device: Walker-rolling Max distance: 18   Walk 10 feet activity   Assist  Walk 10 feet activity did not occur: Safety/medical concerns  Assist level: Contact Guard/Touching assist Assistive device: Walker-rolling   Walk 50 feet activity   Assist Walk 50 feet with 2 turns activity did not occur: Safety/medical  concerns  Assist level: Total Assistance - Patient < 25%      Walk 150 feet activity   Assist Walk 150 feet activity did not occur: Safety/medical concerns         Walk 10 feet on uneven surface  activity   Assist Walk 10 feet on uneven surfaces activity did not occur: Safety/medical concerns         Wheelchair     Assist Will patient use wheelchair at discharge?: No Type of Wheelchair: Manual    Wheelchair assist level: Contact Guard/Touching assist Max wheelchair distance: 150    Wheelchair 50 feet with 2 turns activity    Assist        Assist Level: Contact Guard/Touching assist   Wheelchair 150 feet activity     Assist      Assist Level: Contact Guard/Touching assist   Blood pressure (!) 141/93, pulse 91, temperature 98.6 F (37 C), temperature source Oral, resp. rate 16, height $RemoveBe'5\' 11"'qXjYcqcgc$  (1.803 m), weight 102.7 kg, SpO2 97 %.  Medical Problem List and Plan: 1.  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   Continue CIR PT OT Team conference today please see physician documentation under team conference tab, met with team  to discuss problems,progress, and goals. Formulized individual treatment plan based on medical history, underlying problem and comorbidities.  2.  Antithrombotics: -DVT/anticoagulation:  Pharmaceutical: Lovenox- don't anticipate need at discharge              -antiplatelet therapy: Baby aspirin daily 3. Pain Management: OxyContin 10 mg every 12 hours with oxycodone 5 mg every 4 hours as needed             -Robaxin 500 every 6 as needed ordered             -Voltaren gel for topical relief             -Added K pad for muscle spasm and low back pain             -Tylenol for mild pain -Given Toradol IM x2 days as needed, did great with this, will try low dose celebrex $RemoveBefor'100mg'OYLpEHtRqeZc$  daily to reduce need for narcotic   4. Mood: Team to provide ego support             -continue  Effexor XR 75 mg daily             -antipsychotic agents: N/A, had been haldol before 5. Neuropsych: This patient is capable of making decisions on his own behalf. -Patient's mental status appears to be improving  6. Skin/Wound Care: contract dermatitis, will start hydrocortisone cream , monitor distribution  7. Fluids/Electrolytes/Severe Protein malutrition: Patient now is on regular diet.  Encourage p.o. intake. 8.  Sepsis secondary to discitis and epidural abscess/bacteremia             -  continue IV vancomycin through 9/15             -Monitor clinically and with regular labs 9.  Uncontrolled type 2 diabetes             -Lantus insulin 5 units with sliding scale insulin coverage--increased to 10 units 8/6, increased to 15 on 8/12  CBG (last 3)  Recent Labs    02/09/21 1657 02/09/21 2111 02/10/21 0615  GLUCAP 104* 116* 159*   Still labile , increase lantus to 20U - improved  10.  Chronic diastolic congestive heart failure             -Monitor daily weights             -Anasarca most likely due to nutritional status as opposed to cardiac             -Recent 2D echo demonstrated no wall abnormalities   Filed Weights   01/31/21 0400 02/02/21 0630 02/03/21 0529  Weight: 98.3 kg 102.9 kg 102.7 kg   Stable on 8/11 11.  Hyponatremia- off Free H20  Sodium 129 on 8/14, stable 8/17 asymptomatic              -Hypokalemia, improved , off lasix    12. Anemia d/t illness  Hemoglobin 7.6 on 8/4, stable at 7.8 on 8/15 13. Thrombocytosis:  Platelets 526 on 8/4, improving  476K on 8/15 14.  Blood pressure   Vitals:   02/09/21 1922 02/10/21 0411  BP: 136/88 (!) 141/93  Pulse: 87 91  Resp:  16  Temp: 98.4 F (36.9 C) 98.6 F (37 C)  SpO2: 100% 97%  8/17 mildly elevated cont to monitor on Losartan $RemoveBef'25mg'rWSNWovNRr$  , Toprol XL $RemoveB'25mg'PoZgpCnH$  , Norvasc $RemoveB'5mg'ihaxKdDd$   Will increase amlodipine to $RemoveBefor'10mg'dPPeTmIDRhTV$  reduce losartan to 12.5 mg Monitor med changes 8/18 15.  Myalgia - on statin will hold , nl CK LOS: 13 days A FACE TO  Iaeger E Rafik Koppel 02/10/2021, 8:32 AM

## 2021-02-10 NOTE — Progress Notes (Signed)
Physical Therapy Session Note  Patient Details  Name: Louis [name] MRN: [id number] Date of Birth: 12/10/1965  Today's Date: 02/10/2021 PT Individual Time: 1750-1800 PT Individual Time Calculation (min): 10 min   Short Term Goals: Week 2:  PT Short Term Goal 1 (Week 2): Pt will ambulate >66ft per bout consistently with supervision using LRAD PT Short Term Goal 2 (Week 2): Pt will complete functional transfers using LRAD with supervision. PT Short Term Goal 3 (Week 2): Pt will demonstrate understanding of back precautions with 100% compliance. PT Short Term Goal 4 (Week 2): Pt will demonstrate bed mobility with MinA. PT Short Term Goal 5 (Week 2): Pt will initiate stair training.  Skilled Therapeutic Interventions/Progress Updates:   Pt received supine in bed. Declining OOB movement due to pain caused with assist from NT to reposition while toiletting. PT encouarged bed level therex, but pt concerned of new focal pain to the low back. PT assisted pt to reposition BLE in bed and reduce pressure on low back for pain management, no change reported. Pt Left supine in bed with all needs met.       Therapy Documentation Precautions:  Precautions Precautions: Back Precaution Booklet Issued: No Precaution Comments: Watch compliance with back precautions, self-limits d/t pain/ nausea Restrictions Weight Bearing Restrictions: No  Vital Signs: Therapy Vitals Pulse Rate: 90 Resp: 18 BP: (!) 141/86 Patient Position (if appropriate): Sitting Oxygen Therapy SpO2: 99 % O2 Device: Room Air Pain: 5/10 low back, sharp. Repositioned. No change.     Therapy/Group: Individual Therapy  Lorie Phenix 02/10/2021, 6:26 PM

## 2021-02-11 DIAGNOSIS — R5381 Other malaise: Secondary | ICD-10-CM | POA: Diagnosis not present

## 2021-02-11 LAB — BASIC METABOLIC PANEL
Anion gap: 8 (ref 5–15)
BUN: <5 mg/dL — ABNORMAL LOW (ref 6–20)
CO2: 25 mmol/L (ref 22–32)
Calcium: 8.5 mg/dL — ABNORMAL LOW (ref 8.9–10.3)
Chloride: 93 mmol/L — ABNORMAL LOW (ref 98–111)
Creatinine, Ser: 0.4 mg/dL — ABNORMAL LOW (ref 0.61–1.24)
GFR, Estimated: >60 mL/min (ref >60)
Glucose, Bld: 124 mg/dL — ABNORMAL HIGH (ref 70–99)
Potassium: 3.9 mmol/L (ref 3.5–5.1)
Sodium: 126 mmol/L — ABNORMAL LOW (ref 135–145)

## 2021-02-11 LAB — GLUCOSE, CAPILLARY
Glucose-Capillary: 136 mg/dL — ABNORMAL HIGH (ref 70–99)
Glucose-Capillary: 141 mg/dL — ABNORMAL HIGH (ref 70–99)
Glucose-Capillary: 88 mg/dL (ref 70–99)
Glucose-Capillary: 152 mg/dL — ABNORMAL HIGH (ref 70–99)

## 2021-02-11 MED ORDER — KETOROLAC TROMETHAMINE 60 MG/2ML IM SOLN
30.0000 mg | Freq: Every day | INTRAMUSCULAR | Status: AC
  Administered 2021-02-11 – 2021-02-13 (×3): 30 mg via INTRAMUSCULAR
  Filled 2021-02-11 (×3): qty 2

## 2021-02-11 NOTE — Progress Notes (Signed)
Nutrition Follow-up  DOCUMENTATION CODES:   Severe malnutrition in context of acute illness/injury  INTERVENTION:  Continue Ensure Max po BID, each supplement provides 150 kcal and 30 grams of protein.    Continue Magic cup TID with meals, each supplement provides 290 kcal and 9 grams of protein   Continue Juven BID, each packet provides 95 calories, 2.5 grams of protein.   Encourage adequate PO intake.   NUTRITION DIAGNOSIS:   Severe Malnutrition related to acute illness (MRSA bacteremia) as evidenced by mild fat depletion, moderate muscle depletion, energy intake < or equal to 50% for > or equal to 5 days; ongoing  GOAL:   Patient will meet greater than or equal to 90% of their needs; progressing  MONITOR:   PO intake, Supplement acceptance, Skin, Weight trends, Labs, I & O's  REASON FOR ASSESSMENT:   Malnutrition Screening Tool    ASSESSMENT:   55 yo male admitted to rehab 8/5 from [facility] acute care where he was admitted for worsening back pain after laminectomy on 6/28.  Drain was placed for subdural fluid collections. Patient was treated for MRSA bacteremia. TEE negative for vegetation. PMH includes DM, CAD, HTN, HLD, sleep apnea.  Meal completion has been 95-100%. Pt has been tolerating his PO diet. Pt currently has Ensure Max and Juven ordered with varied consumption. RD to continue with current orders to aid in caloric and protein needs. Labs and medications reviewed.   Diet Order:   Diet Order             Diet heart healthy/carb modified Room service appropriate? Yes; Fluid consistency: Thin  Diet effective now                   EDUCATION NEEDS:   Education needs have been addressed  Skin:  Skin Assessment: Reviewed RN Assessment Skin Integrity Issues:: Stage II Stage II: coccyx  Last BM:  8/18  Height:   Ht Readings from Last 1 Encounters:  01/28/21 5\' 11"  (1.803 m)    Weight:   Wt Readings from Last 1 Encounters:  02/03/21 102.7 kg    BMI:  Body mass index is 31.58 kg/m.  Estimated Nutritional Needs:   Kcal:  2200-2400  Protein:  120-140 gm  Fluid:  >/= 2.2 L  04/05/21, MS, RD, LDN RD pager number/after hours weekend pager number on Amion.

## 2021-02-11 NOTE — Progress Notes (Signed)
Physical Therapy Session Note  Patient Details  Name: Louis [name] MRN: [id number] Date of Birth: [birth year]  Today's Date: 02/11/2021 PT Individual Time: 1405-1430 PT Individual Time Calculation (min): 25 min   Short Term Goals: Week 2:  PT Short Term Goal 1 (Week 2): Pt will ambulate >30ft per bout consistently with supervision using LRAD PT Short Term Goal 2 (Week 2): Pt will complete functional transfers using LRAD with supervision. PT Short Term Goal 3 (Week 2): Pt will demonstrate understanding of back precautions with 100% compliance. PT Short Term Goal 4 (Week 2): Pt will demonstrate bed mobility with MinA. PT Short Term Goal 5 (Week 2): Pt will initiate stair training.  Skilled Therapeutic Interventions/Progress Updates: Pt presented in bed agreeable to therapy. PT states pain 10/10, nsg aware. Pt frustrated because no intervention seems to be working. Provided emotional support. Pt agreeable to supine therex including AA SLR x10, hip abd x 10, and SAQ x 10 bilaterally. Pt then performed supine to sit at EOB via logroll technique with use of bed features and CGA. Pt then performed LAQ with PTA assist to complete range x 10 bilaterally.  Performed lateral scooting to [facility] and returned to supine with CGA and was able to pull self up to top of bed with use of bed rails. Pt left in bed at end of session with call bell within reach and nsg present.      Therapy Documentation Precautions:  Precautions Precautions: Back Precaution Booklet Issued: No Precaution Comments: Watch compliance with back precautions, self-limits d/t pain/ nausea Restrictions Weight Bearing Restrictions: No   Therapy/Group: Individual Therapy  Adarryl Goldammer K[place]undra Hair, PTA  02/11/2021, 4:08 PM

## 2021-02-11 NOTE — Progress Notes (Signed)
Pharmacy Antibiotic Note  Louis [name] is a 55 y.o. male admitted on 01/05/21 with epidural abscess with MRSA bacteremia/lumbar osteomyelitis for Vancomycin.  Transferred to Rehab on 01/28/21.   Last vancomycin levels were collected around 8/16 AM dose. Timed and collected appropriately.    Vancomycin peak = 23 mcg/ml.    Vancomycin trough = 13 mcg/ml.    AUC = 458,  which is within goal range of 400-550  Serum creatinine continues to be stable, ~0.4, last checked 02/11/21.  Next levels planned for Tuesday 02/15/21  Plan: Continue Vancomycin 1500 mg q 12 hours Vancomycin planned thru 03/10/21 Will plan to check Vanc levels weekly or sooner if needed. Monitor renal function at least q 72 hours Follow clinical progress.    Height: 5\' 11"  (180.3 cm) Weight: 102.7 kg (226 lb 6.6 oz) IBW/kg (Calculated) : 75.3  Temp (24hrs), Avg:98.8 F (37.1 C), Min:98.7 F (37.1 C), Max:98.9 F (37.2 C)  Recent Labs  Lab 02/06/21 0336 02/07/21 0356 02/08/21 0743 02/08/21 1348 02/09/21 0420 02/11/21 0430  WBC  --  7.0  --   --   --   --   CREATININE 0.39*  --   --   --  0.35* 0.40*  VANCOTROUGH  --   --  13*  --   --   --   VANCOPEAK  --   --   --  23*  --   --      Estimated Creatinine Clearance: 124.4 mL/min (A) (by C-G formula based on SCr of 0.4 mg/dL (L)).    No Known Allergies  Cubicin PTA >> 7/14 (initial plan through 8/10, last OPAT 7/13); 7/20 >>7/21 Ceftaroline 7/20 >> 8/5 Vanc 7/14 >>7/20; 7/21>> (9/15) Fluconazole 7/17>> 7/23 for oral thrush  7/18 VP 22, VT 9 (drawn late), AUC 385 on 1500 q12 >> incr 1250 q8 7/23: VP 28, VT 21,  AUC 575 >> change to 1750/12h for AUC 536 7/26-27: VP 31, VT 14 (drawn early), AUC 523 >> con't 1750mg  q12 8/4-8/5: VP 33, VT 22 ( both drawn early)- AUC 551>>Continue 1500 q 12 for now  8/9 VP 27, VT 14 (on time) - AUC 492, continue 1500 mg IV q12h 8/16 VP 23, VT 13-AUC 458, continue 1500mg  IV q12h   7/13 BCx - 3/4 MRSA 7/15 Bcx: staph aureus 7/16  wound cx - neg 7/17 Bcx- 3/4 MRSA 7/19 Bcx: MRSA 7/20 Bcx: staph aureus 7/22 Bcx - negative 7/24 Bcx - NGF 7/26 Bcx - NGF  8/22, RPh Clinical Pharmacist [place] [phone] Please utilize Amion for appropriate phone number to reach the unit pharmacist [facility])  02/11/2021, 2:56 PM

## 2021-02-11 NOTE — Progress Notes (Signed)
Occupational Therapy Session Note  Patient Details  Name: Louis [name] MRN: [id number] Date of Birth: 1966-03-19  Today's Date: 02/11/2021 OT Individual Time: 6387-5643 OT Individual Time Calculation (min): 46 min    Short Term Goals: Week 2:  OT Short Term Goal 1 (Week 2): STG = LTG 2/2 ELOS  Skilled Therapeutic Interventions/Progress Updates:    Pt received semi-reclined in bed, req pain rx. LPN notified. Agreeable to therapy with encouragement. Session focus on self-care retraining, therapeutic use of self, bed repositioning, PLB in prep for improved ADL/IADL/func mobility performance + decreased caregiver burden. MD in/out for assessment. Relates increased back pain post bed mobility with nursing during previous night, "I don't think I can get out of bed." Boosts self up in bed with noted increased in back pain and radiating pain down BLE. Reviewed back precautions and informed pt to primarily push with his BLE if he was to boost himself up. Rolled R/L with min A + total A to doff/don underwear and apply chuck pad to allow for staff assist for boosting. Therapeutic self utilized throughout session 2/2 increase in pain. Reviewed positioning options, but pt relates nothing has improved his pain except for "strong injection" he received a week prior. Elevated BLE to help decrease swelling. Relates "heart is jumping out of chest." BPM read at 98, per LPN typically 97 bpm at rest. Guided pt through PLB as he notes "hyperventilation" but, will cont to benefit from cont edu/practice to manage pain effectively. Cont to be significantly limited by pain, impairing therapy participation and ADL performance.  Pt left semi-reclined in bed with LPN present with call bell in reach, and all immediate needs met.    Therapy Documentation Precautions:  Precautions Precautions: Back Precaution Booklet Issued: No Precaution Comments: Watch compliance with back precautions, self-limits d/t pain/  nausea Restrictions Weight Bearing Restrictions: No  Pain: 10/10 lower L back pain ADL: See Care Tool for more details.  Therapy/Group: Individual Therapy  Volanda Napoleon MS, OTR/L  02/11/2021, 6:41 AM

## 2021-02-11 NOTE — Progress Notes (Signed)
PROGRESS NOTE   Subjective/Complaints: Bilateral hands without pain or itching , pt states back flared up after nurse "moved him quickly " during bathing , asking for Toradol rather than celebrex ROS: Denies CP, SOB, N/V/D  Objective:   No results found. No results for input(s): WBC, HGB, HCT, PLT in the last 72 hours.  Recent Labs    02/09/21 0420 02/11/21 0430  NA 129* 126*  K 3.5 3.9  CL 97* 93*  CO2 25 25  GLUCOSE 166* 124*  BUN <5* <5*  CREATININE 0.35* 0.40*  CALCIUM 8.4* 8.5*     Intake/Output Summary (Last 24 hours) at 02/11/2021 0842 Last data filed at 02/11/2021 0449 Gross per 24 hour  Intake 551 ml  Output 1375 ml  Net -824 ml      Pressure Injury 01/28/21 Coccyx Left Stage 2 -  Partial thickness loss of dermis presenting as a shallow open injury with a red, pink wound bed without slough. pink, moist (Active)  01/28/21 1845  Location: Coccyx  Location Orientation: Left  Staging: Stage 2 -  Partial thickness loss of dermis presenting as a shallow open injury with a red, pink wound bed without slough.  Wound Description (Comments): pink, moist  Present on Admission: Yes    Physical Exam: Vital Signs Blood pressure 131/85, pulse 89, temperature 98.8 F (37.1 C), temperature source Oral, resp. rate 18, height 5\' 11"  (1.803 m), weight 102.7 kg, SpO2 97 %.  General: No acute distress Mood and affect are appropriate Heart: Regular rate and rhythm no rubs murmurs or extra sounds Lungs: Clear to auscultation, breathing unlabored, no rales or wheezes Abdomen: Positive bowel sounds, soft nontender to palpation, nondistended Extremities: No clubbing, cyanosis, or edema Skin: No evidence of breakdown, 0.5-1cm papules mainly at base of both palms and Right distal lateral 5th digit, no discharge, non tender, skin appears dry MSK- tenderness to light palpation Bilateral arms forearms , thighs and calves  Mood  and affect approp Neuro: Alert and oriented Motor: Grossly 4/5 throughout, unchanged  Assessment/Plan: 1. Functional deficits which require 3+ hours per day of interdisciplinary therapy in a comprehensive inpatient rehab setting. Physiatrist is providing close team supervision and 24 hour management of active medical problems listed below. Physiatrist and rehab team continue to assess barriers to discharge/monitor patient progress toward functional and medical goals  Care Tool:  Bathing  Bathing activity did not occur: Refused Body parts bathed by patient: Right arm, Left arm, Chest, Abdomen, Right upper leg, Left upper leg, Face   Body parts bathed by helper: Front perineal area, Buttocks, Right lower leg, Left lower leg     Bathing assist Assist Level: Moderate Assistance - Patient 50 - 74%     Upper Body Dressing/Undressing Upper body dressing   What is the patient wearing?: Pull over shirt    Upper body assist Assist Level: Supervision/Verbal cueing    Lower Body Dressing/Undressing Lower body dressing      What is the patient wearing?: Pants, Underwear/pull up     Lower body assist Assist for lower body dressing: Maximal Assistance - Patient 25 - 49%     Toileting Toileting    Toileting assist  Assist for toileting: Independent with assistive device Assistive Device Comment: Urinal   Transfers Chair/bed transfer  Transfers assist     Chair/bed transfer assist level: Minimal Assistance - Patient > 75% (stand pivot with RW)     Locomotion Ambulation   Ambulation assist      Assist level: Contact Guard/Touching assist Assistive device: Walker-rolling Max distance: 18   Walk 10 feet activity   Assist  Walk 10 feet activity did not occur: Safety/medical concerns  Assist level: Contact Guard/Touching assist Assistive device: Walker-rolling   Walk 50 feet activity   Assist Walk 50 feet with 2 turns activity did not occur: Safety/medical  concerns  Assist level: Total Assistance - Patient < 25%      Walk 150 feet activity   Assist Walk 150 feet activity did not occur: Safety/medical concerns         Walk 10 feet on uneven surface  activity   Assist Walk 10 feet on uneven surfaces activity did not occur: Safety/medical concerns         Wheelchair     Assist Will patient use wheelchair at discharge?: No Type of Wheelchair: Manual    Wheelchair assist level: Contact Guard/Touching assist Max wheelchair distance: 150    Wheelchair 50 feet with 2 turns activity    Assist        Assist Level: Contact Guard/Touching assist   Wheelchair 150 feet activity     Assist      Assist Level: Contact Guard/Touching assist   Blood pressure 131/85, pulse 89, temperature 98.8 F (37.1 C), temperature source Oral, resp. rate 18, height 5\' 11"  (1.803 m), weight 102.7 kg, SpO2 97 %.  Medical Problem List and Plan: 1.  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   Continue CIR PT OT   2.  Antithrombotics: -DVT/anticoagulation:  Pharmaceutical: Lovenox- don't anticipate need at discharge              -antiplatelet therapy: Baby aspirin daily 3. Pain Management: OxyContin 10 mg every 12 hours with oxycodone 5 mg every 4 hours as needed             -Robaxin 500 every 6 as needed ordered             -Voltaren gel for topical relief             -Added K pad for muscle spasm and low back pain             -Tylenol for mild pain -resume Toradol IM x3 days as needed, stop celebrex  4. Mood: Team to provide ego support             -continue Effexor XR 75 mg daily             -antipsychotic agents: N/A, had been haldol before 5. Neuropsych: This patient is capable of making decisions on his own behalf. -Patient's mental status appears to be improving  6. Skin/Wound Care: contact dermatitis, will start hydrocortisone cream , no change in distribution  7.  Fluids/Electrolytes/Severe Protein malutrition: Patient now is on regular diet.  Encourage p.o. intake. 8.  Sepsis secondary to discitis and epidural abscess/bacteremia             -continue IV vancomycin through 9/15             -Monitor clinically and with regular labs 9.  Uncontrolled type 2 diabetes             -  Lantus insulin 5 units with sliding scale insulin coverage--increased to 10 units 8/6, increased to 15 on 8/12  CBG (last 3)  Recent Labs    02/10/21 1729 02/10/21 2134 02/11/21 0619  GLUCAP 302* 118* 136*   Still labile , increase lantus to 20U - improved  10.  Chronic diastolic congestive heart failure             -Monitor daily weights             -Anasarca most likely due to nutritional status as opposed to cardiac             -Recent 2D echo demonstrated no wall abnormalities   Filed Weights   01/31/21 0400 02/02/21 0630 02/03/21 0529  Weight: 98.3 kg 102.9 kg 102.7 kg   Stable on 8/11 11.  Hyponatremia- off Free H20  I/O looks ok  Sodium 126 asymptomatic              -Hypokalemia, improved , off lasix    12. Anemia d/t illness  Hemoglobin 7.6 on 8/4, stable at 7.8 on 8/15 13. Thrombocytosis:  Platelets 526 on 8/4, improving  476K on 8/15 14.  Blood pressure   Vitals:   02/10/21 1931 02/11/21 0448  BP: (!) 151/84 131/85  Pulse: 88 89  Resp:  18  Temp: 98.9 F (37.2 C) 98.8 F (37.1 C)  SpO2: 99% 97%  8/17 mildly elevated cont to monitor on Losartan 25mg  , Toprol XL 25mg  , Norvasc 5mg   Will increase amlodipine to 10mg  reduce losartan to 12.5 mg Monitor med changes 8/19 15.  Myalgia - on statin will hold , nl CK LOS: 14 days A FACE TO FACE EVALUATION WAS PERFORMED  02/11/2021, 8:42 AM

## 2021-02-12 DIAGNOSIS — R5381 Other malaise: Secondary | ICD-10-CM | POA: Diagnosis not present

## 2021-02-12 LAB — C-REACTIVE PROTEIN: CRP: 5 mg/dL — ABNORMAL HIGH (ref <1.0)

## 2021-02-12 LAB — GLUCOSE, CAPILLARY
Glucose-Capillary: 138 mg/dL — ABNORMAL HIGH (ref 70–99)
Glucose-Capillary: 93 mg/dL (ref 70–99)
Glucose-Capillary: 156 mg/dL — ABNORMAL HIGH (ref 70–99)
Glucose-Capillary: 133 mg/dL — ABNORMAL HIGH (ref 70–99)

## 2021-02-12 NOTE — Progress Notes (Signed)
PROGRESS NOTE   Subjective/Complaints: Patient is concerned about lower extremity pain.  He has pain in both thighs and both calves.  He gives a history of prior DVT.  He has been on Lovenox.  Patient has no progressive swelling of the lower extremities.  ROS: Denies CP, SOB, N/V/D  Objective:   No results found. No results for input(s): WBC, HGB, HCT, PLT in the last 72 hours.  Recent Labs    02/11/21 0430  NA 126*  K 3.9  CL 93*  CO2 25  GLUCOSE 124*  BUN <5*  CREATININE 0.40*  CALCIUM 8.5*     Intake/Output Summary (Last 24 hours) at 02/12/2021 1303 Last data filed at 02/12/2021 1000 Gross per 24 hour  Intake 1146.16 ml  Output 1400 ml  Net -253.84 ml          Physical Exam: Vital Signs Blood pressure 136/86, pulse 88, temperature (!) 97.1 F (36.2 C), resp. rate 18, height 5\' 11"  (1.803 m), weight 102.7 kg, SpO2 98 %.   General: No acute distress Mood and affect are appropriate Heart: Regular rate and rhythm no rubs murmurs or extra sounds Lungs: Clear to auscultation, breathing unlabored, no rales or wheezes Abdomen: Positive bowel sounds, soft nontender to palpation, nondistended Extremities: No clubbing, cyanosis, 1-2+ pretibial edema edema Skin: No evidence of breakdown, 0.5-1cm macules mainly at base of both palms and Right distal lateral 5th digit, no discharge, non tender, skin appears dry MSK- tenderness to light palpation Bilateral arms forearms , thighs and calves  Mood and affect approp Neuro: Alert and oriented Motor: Grossly 4/5 throughout, unchanged  Assessment/Plan: 1. Functional deficits which require 3+ hours per day of interdisciplinary therapy in a comprehensive inpatient rehab setting. Physiatrist is providing close team supervision and 24 hour management of active medical problems listed below. Physiatrist and rehab team continue to assess barriers to discharge/monitor patient  progress toward functional and medical goals  Care Tool:  Bathing  Bathing activity did not occur: Refused Body parts bathed by patient: Right arm, Left arm, Chest, Abdomen, Right upper leg, Left upper leg, Face   Body parts bathed by helper: Front perineal area, Buttocks, Right lower leg, Left lower leg     Bathing assist Assist Level: Moderate Assistance - Patient 50 - 74%     Upper Body Dressing/Undressing Upper body dressing   What is the patient wearing?: Pull over shirt    Upper body assist Assist Level: Supervision/Verbal cueing    Lower Body Dressing/Undressing Lower body dressing      What is the patient wearing?: Pants, Underwear/pull up     Lower body assist Assist for lower body dressing: Maximal Assistance - Patient 25 - 49%     Toileting Toileting    Toileting assist Assist for toileting: Independent with assistive device Assistive Device Comment: Urinal   Transfers Chair/bed transfer  Transfers assist     Chair/bed transfer assist level: Minimal Assistance - Patient > 75% (stand pivot with RW)     Locomotion Ambulation   Ambulation assist      Assist level: Contact Guard/Touching assist Assistive device: Walker-rolling Max distance: 18   Walk 10 feet activity  Assist  Walk 10 feet activity did not occur: Safety/medical concerns  Assist level: Contact Guard/Touching assist Assistive device: Walker-rolling   Walk 50 feet activity   Assist Walk 50 feet with 2 turns activity did not occur: Safety/medical concerns  Assist level: Total Assistance - Patient < 25%      Walk 150 feet activity   Assist Walk 150 feet activity did not occur: Safety/medical concerns         Walk 10 feet on uneven surface  activity   Assist Walk 10 feet on uneven surfaces activity did not occur: Safety/medical concerns         Wheelchair     Assist Will patient use wheelchair at discharge?: No Type of Wheelchair: Manual     Wheelchair assist level: Contact Guard/Touching assist Max wheelchair distance: 150    Wheelchair 50 feet with 2 turns activity    Assist        Assist Level: Contact Guard/Touching assist   Wheelchair 150 feet activity     Assist      Assist Level: Contact Guard/Touching assist   Blood pressure 136/86, pulse 88, temperature (!) 97.1 F (36.2 C), resp. rate 18, height 5\' 11"  (1.803 m), weight 102.7 kg, SpO2 98 %.  Medical Problem List and Plan: 1.  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   Continue CIR PT OT   2.  Antithrombotics: -DVT/anticoagulation:  Pharmaceutical: Lovenox- don't anticipate need at discharge              -antiplatelet therapy: Baby aspirin daily Lower extremity pain pretibial swelling checking Doppler venous ultrasound bilateral lower limbs 3. Pain Management: OxyContin 10 mg every 12 hours with oxycodone 5 mg every 4 hours as needed             -Robaxin 500 every 6 as needed ordered             -Voltaren gel for topical relief             -Added K pad for muscle spasm and low back pain             -Tylenol for mild pain -resume Toradol IM x3 days as needed, stop celebrex  4. Mood: Team to provide ego support             -continue Effexor XR 75 mg daily             -antipsychotic agents: N/A, had been haldol before 5. Neuropsych: This patient is capable of making decisions on his own behalf. -Patient's mental status appears to be improving  6. Skin/Wound Care: contact dermatitis, will start hydrocortisone cream , no change in distribution  7. Fluids/Electrolytes/Severe Protein malutrition: Patient now is on regular diet.  Encourage p.o. intake. 8.  Sepsis secondary to discitis and epidural abscess/bacteremia             -continue IV vancomycin through 9/15             -Monitor clinically and with regular labs 9.  Uncontrolled type 2 diabetes             -Lantus insulin 5 units with  sliding scale insulin coverage--increased to 10 units 8/6, increased to 15 on 8/12  CBG (last 3)  Recent Labs    02/11/21 2108 02/12/21 0612 02/12/21 1139  GLUCAP 152* 138* 93   Still labile , increase lantus to 20U - improved  10.  Chronic diastolic congestive  heart failure             -Monitor daily weights             -Anasarca most likely due to nutritional status as opposed to cardiac             -Recent 2D echo demonstrated no wall abnormalities   Filed Weights   01/31/21 0400 02/02/21 0630 02/03/21 0529  Weight: 98.3 kg 102.9 kg 102.7 kg   Stable on 8/11 11.  Hyponatremia- off Free H20  I/O looks ok  Sodium 126 asymptomatic              -Hypokalemia, improved , off lasix    12. Anemia d/t illness  Hemoglobin 7.6 on 8/4, stable at 7.8 on 8/15 13. Thrombocytosis:  Platelets 526 on 8/4, improving  476K on 8/15 14.  Blood pressure   Vitals:   02/12/21 0321 02/12/21 0430  BP: (!) 143/95 136/86  Pulse: 88 88  Resp: 18 18  Temp: (!) 95.1 F (35.1 C) (!) 97.1 F (36.2 C)  SpO2: 100% 98%  8/17 mildly elevated cont to monitor on Losartan 25mg  , Toprol XL 25mg  , Norvasc 5mg   Will increase amlodipine to 10mg  reduce losartan to 12.5 mg Blood pressure improving this AM. 15.  Myalgia - on statin will hold , nl CK, as discussed with patient this may take weeks to improve LOS: 15 days A FACE TO FACE EVALUATION WAS PERFORMED  02/12/2021, 1:03 PM

## 2021-02-12 NOTE — Progress Notes (Signed)
Physical Therapy Session Note  Patient Details  Name: Louis [name] MRN: [id number] Date of Birth: 1965/08/20  Today's Date: 02/12/2021 PT Individual Time: 1105-1115 PT Individual Time Calculation (min): 10 min   Short Term Goals:  Week 2:  PT Short Term Goal 1 (Week 2): Pt will ambulate >38f per bout consistently with supervision using LRAD PT Short Term Goal 2 (Week 2): Pt will complete functional transfers using LRAD with supervision. PT Short Term Goal 3 (Week 2): Pt will demonstrate understanding of back precautions with 100% compliance. PT Short Term Goal 4 (Week 2): Pt will demonstrate bed mobility with MinA. PT Short Term Goal 5 (Week 2): Pt will initiate stair training.  Skilled Therapeutic Interventions/Progress Updates:   Pt received supine in bed. Pt reports that he feels that he may have a DVT in the RLE and declining any out of bed mobility. PT assessed the RLE no erythema noted. Mild symmetrical edema noted in BLE. No difference in temperature to the touch RLE vs LLE.  PT assisted pt to reposition BLE for pain management and left supine in bed with all needs met.       Therapy Documentation Precautions:  Precautions Precautions: Back Precaution Booklet Issued: No Precaution Comments: Watch compliance with back precautions, self-limits d/t pain/ nausea Restrictions Weight Bearing Restrictions: No General: PT Amount of Missed Time (min): 20 Minutes PT Missed Treatment Reason: Other (Comment) (awaiting ultrasound for DVT)  Pain: Pain Assessment Pain Scale: 0-10 Pain Score: 7  Faces Pain Scale: No hurt Pain Type: Chronic pain Pain Location: Leg Pain Orientation: Right Pain Descriptors / Indicators: Aching Pain Frequency: Intermittent Patients Stated Pain Goal: 1    Therapy/Group: Individual Therapy  [name]8/20/2022, 12:13 PM

## 2021-02-12 NOTE — Progress Notes (Signed)
Pt c/o of pain to RLE, states "it feels like the last time I had a DVT". Nurse updated  MD, order for doppler obtained and placed.

## 2021-02-12 NOTE — Progress Notes (Addendum)
Occupational Therapy Session Note  Patient Details  Name: Louis [name] MRN: [id number] Date of Birth: 09-29-1965  Today's Date: 02/12/2021 OT Individual Time: 3716-9678 OT Individual Time Calculation (min): 25 min  and Today's Date: 02/12/2021 OT Missed Time: 30 Minutes Missed Time Reason: Pain;Other (comment) (awaiting doppler results post onset RLE pain)   Short Term Goals: Week 2:  OT Short Term Goal 1 (Week 2): STG = LTG 2/2 ELOS  Skilled Therapeutic Interventions/Progress Updates:    Pt received semi-reclined in bed with LPN present. C/o new 10/10 RLE pain. Back pain cont to be at 7/10. Reports concern of "new blood clot" in RLE 2/2 pain in R foot and medial knee; states that this is exactly where it was painful last time he had a blood clot and requesting dopplers. No particular increase in warmness/swelling noted between BLE. LPN notified and present to assess/ to notify PA/MD.  Jeneen Rinks LH sponge to pt and discussed use for LBB and adherence to back precautions. Therapeutic use of self utilized throughout for pain management. Total A to don B socks per pt req, reports significant cold feet. Will hold further therapy until medical clearance/dopplers performed to rule out DVT. Pt missed 30 minutes of scheduled OT. Pt left semi-reclined in bed with call bell in reach, and all immediate needs met.    Therapy Documentation Precautions:  Precautions Precautions: Back Precaution Booklet Issued: No Precaution Comments: Watch compliance with back precautions, self-limits d/t pain/ nausea Restrictions Weight Bearing Restrictions: No  Pain: see session note ADL: See Care Tool for more details.   Therapy/Group: Individual Therapy  Volanda Napoleon MS, OTR/L  02/12/2021, 6:36 AM

## 2021-02-13 ENCOUNTER — Inpatient Hospital Stay (HOSPITAL_COMMUNITY): Payer: Federal, State, Local not specified - PPO

## 2021-02-13 DIAGNOSIS — R5381 Other malaise: Secondary | ICD-10-CM | POA: Diagnosis not present

## 2021-02-13 DIAGNOSIS — M79605 Pain in left leg: Secondary | ICD-10-CM

## 2021-02-13 DIAGNOSIS — M79604 Pain in right leg: Secondary | ICD-10-CM | POA: Diagnosis not present

## 2021-02-13 DIAGNOSIS — M7989 Other specified soft tissue disorders: Secondary | ICD-10-CM

## 2021-02-13 LAB — BASIC METABOLIC PANEL
Anion gap: 8 (ref 5–15)
BUN: <5 mg/dL — ABNORMAL LOW (ref 6–20)
CO2: 24 mmol/L (ref 22–32)
Calcium: 8.2 mg/dL — ABNORMAL LOW (ref 8.9–10.3)
Chloride: 95 mmol/L — ABNORMAL LOW (ref 98–111)
Creatinine, Ser: 0.46 mg/dL — ABNORMAL LOW (ref 0.61–1.24)
GFR, Estimated: >60 mL/min (ref >60)
Glucose, Bld: 127 mg/dL — ABNORMAL HIGH (ref 70–99)
Potassium: 3.7 mmol/L (ref 3.5–5.1)
Sodium: 127 mmol/L — ABNORMAL LOW (ref 135–145)

## 2021-02-13 LAB — GLUCOSE, CAPILLARY
Glucose-Capillary: 112 mg/dL — ABNORMAL HIGH (ref 70–99)
Glucose-Capillary: 155 mg/dL — ABNORMAL HIGH (ref 70–99)
Glucose-Capillary: 260 mg/dL — ABNORMAL HIGH (ref 70–99)
Glucose-Capillary: 156 mg/dL — ABNORMAL HIGH (ref 70–99)

## 2021-02-13 NOTE — Progress Notes (Signed)
PROGRESS NOTE   Subjective/Complaints: Patient refused OT today.  We discussed the importance of going to his therapy sessions given his upcoming discharge.  No pain complaints today.  Doppler has not been completed yet  ROS: Denies CP, SOB, N/V/D  Objective:   No results found. No results for input(s): WBC, HGB, HCT, PLT in the last 72 hours.  Recent Labs    02/11/21 0430  NA 126*  K 3.9  CL 93*  CO2 25  GLUCOSE 124*  BUN <5*  CREATININE 0.40*  CALCIUM 8.5*     Intake/Output Summary (Last 24 hours) at 02/13/2021 1036 Last data filed at 02/13/2021 0445 Gross per 24 hour  Intake 813.25 ml  Output 1600 ml  Net -786.75 ml          Physical Exam: Vital Signs Blood pressure 133/86, pulse 87, temperature 97.9 F (36.6 C), temperature source Oral, resp. rate 18, height 5\' 11"  (1.803 m), weight 102.7 kg, SpO2 100 %.   General: No acute distress Mood and affect are appropriate Heart: Regular rate and rhythm no rubs murmurs or extra sounds Lungs: Clear to auscultation, breathing unlabored, no rales or wheezes Abdomen: Positive bowel sounds, soft nontender to palpation, nondistended Extremities: No clubbing, cyanosis, or edema  Skin: No evidence of breakdown, 0.5-1cm macules mainly at base of both palms and Right distal lateral 5th digit, no discharge, non tender, skin appears dry MSK- tenderness to light palpation Bilateral arms forearms , thighs and calves  Mood and affect approp Neuro: Alert and oriented Motor: Grossly 4/5 throughout, unchanged  Assessment/Plan: 1. Functional deficits which require 3+ hours per day of interdisciplinary therapy in a comprehensive inpatient rehab setting. Physiatrist is providing close team supervision and 24 hour management of active medical problems listed below. Physiatrist and rehab team continue to assess barriers to discharge/monitor patient progress toward functional and  medical goals  Care Tool:  Bathing  Bathing activity did not occur: Refused Body parts bathed by patient: Right arm, Left arm, Chest, Abdomen, Right upper leg, Left upper leg, Face   Body parts bathed by helper: Front perineal area, Buttocks, Right lower leg, Left lower leg     Bathing assist Assist Level: Moderate Assistance - Patient 50 - 74%     Upper Body Dressing/Undressing Upper body dressing   What is the patient wearing?: Pull over shirt    Upper body assist Assist Level: Supervision/Verbal cueing    Lower Body Dressing/Undressing Lower body dressing      What is the patient wearing?: Pants, Underwear/pull up     Lower body assist Assist for lower body dressing: Maximal Assistance - Patient 25 - 49%     Toileting Toileting    Toileting assist Assist for toileting: Independent with assistive device Assistive Device Comment: Urinal   Transfers Chair/bed transfer  Transfers assist     Chair/bed transfer assist level: Minimal Assistance - Patient > 75% (stand pivot with RW)     Locomotion Ambulation   Ambulation assist      Assist level: Contact Guard/Touching assist Assistive device: Walker-rolling Max distance: 18   Walk 10 feet activity   Assist  Walk 10 feet activity did not  occur: Safety/medical concerns  Assist level: Contact Guard/Touching assist Assistive device: Walker-rolling   Walk 50 feet activity   Assist Walk 50 feet with 2 turns activity did not occur: Safety/medical concerns  Assist level: Total Assistance - Patient < 25%      Walk 150 feet activity   Assist Walk 150 feet activity did not occur: Safety/medical concerns         Walk 10 feet on uneven surface  activity   Assist Walk 10 feet on uneven surfaces activity did not occur: Safety/medical concerns         Wheelchair     Assist Will patient use wheelchair at discharge?: No Type of Wheelchair: Manual    Wheelchair assist level: Contact  Guard/Touching assist Max wheelchair distance: 150    Wheelchair 50 feet with 2 turns activity    Assist        Assist Level: Contact Guard/Touching assist   Wheelchair 150 feet activity     Assist      Assist Level: Contact Guard/Touching assist   Blood pressure 133/86, pulse 87, temperature 97.9 F (36.6 C), temperature source Oral, resp. rate 18, height 5\' 11"  (1.803 m), weight 102.7 kg, SpO2 100 %.  Medical Problem List and Plan: 1.  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   Continue CIR PT OT, estimated length of stay 02/17/2021   2.  Antithrombotics: -DVT/anticoagulation:  Pharmaceutical: Lovenox- don't anticipate need at discharge              -antiplatelet therapy: Baby aspirin daily Lower extremity pain pretibial swelling ,Doppler venous ultrasound bilateral lower limbs pnd 3. Pain Management: OxyContin 10 mg every 12 hours with oxycodone 5 mg every 4 hours as needed             -Robaxin 500 every 6 as needed ordered             -Voltaren gel for topical relief             -Added K pad for muscle spasm and low back pain             -Tylenol for mild pain -resume Toradol IM x3 days as needed, stop celebrex  4. Mood: Team to provide ego support             -continue Effexor XR 75 mg daily             -antipsychotic agents: N/A, had been haldol before 5. Neuropsych: This patient is capable of making decisions on his own behalf. -Patient's mental status appears to be improving  6. Skin/Wound Care: contact dermatitis, will start hydrocortisone cream , no change in distribution  7. Fluids/Electrolytes/Severe Protein malutrition: Patient now is on regular diet.  Encourage p.o. intake. 8.  Sepsis secondary to discitis and epidural abscess/bacteremia             -continue IV vancomycin through 9/15             -Monitor clinically and with regular labs 9.  Uncontrolled type 2 diabetes             -Lantus  insulin 5 units with sliding scale insulin coverage--increased to 10 units 8/6, increased to 15 on 8/12  CBG (last 3)  Recent Labs    02/12/21 1636 02/12/21 2117 02/13/21 0600  GLUCAP 156* 133* 112*   , increase lantus to 20U - improved  10.  Chronic diastolic congestive heart failure             -  Monitor daily weights             -Anasarca most likely due to nutritional status as opposed to cardiac             -Recent 2D echo demonstrated no wall abnormalities   Filed Weights   01/31/21 0400 02/02/21 0630 02/03/21 0529  Weight: 98.3 kg 102.9 kg 102.7 kg   Stable on 8/11 11.  Hyponatremia- off Free H20  I/O looks ok  Sodium 126 asymptomatic              -Hypokalemia, improved , off lasix    12. Anemia d/t illness  Hemoglobin 7.6 on 8/4, stable at 7.8 on 8/15 recheck in am  13. Thrombocytosis:  Platelets 526 on 8/4, improving  476K on 8/15, recheck in am  14.  Blood pressure   Vitals:   02/12/21 1922 02/13/21 0314  BP: 115/84 133/86  Pulse: 85 87  Resp: 18 18  Temp: 98.1 F (36.7 C) 97.9 F (36.6 C)  SpO2: 99% 100%  8/21 controlled  on Losartan 25mg  , Toprol XL 25mg  , Norvasc 5mg   Will increase amlodipine to 10mg  reduce losartan to 12.5 mg  15.  Myalgia - on statin will hold , nl CK, as discussed with patient this may take weeks to improve LOS: 16 days A FACE TO FACE EVALUATION WAS PERFORMED  02/13/2021, 10:36 AM

## 2021-02-13 NOTE — Progress Notes (Signed)
Physical Therapy Session Note  Patient Details  Name: Louis [name] MRN: [id number] Date of Birth: 1965-11-15  Today's Date: 02/13/2021 PT Individual Time: 1130-1200 and 1300-1400 PT Individual Time Calculation (min): 60 min and 30 min in AM  Short Term Goals: Week 2:  PT Short Term Goal 1 (Week 2): Pt will ambulate >11ft per bout consistently with supervision using LRAD PT Short Term Goal 2 (Week 2): Pt will complete functional transfers using LRAD with supervision. PT Short Term Goal 3 (Week 2): Pt will demonstrate understanding of back precautions with 100% compliance. PT Short Term Goal 4 (Week 2): Pt will demonstrate bed mobility with MinA. PT Short Term Goal 5 (Week 2): Pt will initiate stair training.  Skilled Therapeutic Interventions/Progress Updates:   First session:  Pt presents supine in bed and agreeable to therapy.  Pt performed log roll to Left , but verbal cues for improved hooklying position before initiating roll.  Pt then required min A for sidelying to sit.  Pt transfers sit to stand w/ CGA and RW and then CGA for amb in room up to 25' including turns to return to seat.  Pt w/o L knee buckling, but cueing/education given for maintaining position w/in RW.  Pt performed sitting there ex for increased strength.  Pt performed calf raises, LAQ, hip flexion 3 x 10-15. Pt remained sitting in recliner for lunch w/ seat alarm on and all needs in reach.  Second session:Pt presents sitting in recliner and agreeable to therapy.  Pt performed warm-ups to Les w/ LAQ, calf raises and then abdominal crunches in recliner 3 x 10 w/ cueing for breathing technique and 3 second hold.  Pt transferred multiple trials from recliner w/ CGA to close supervision.  Pt amb multiple trials from 25-35' w/ RW and CGA including turns to return to seat.  Pt had 1 episode of L knee buckling but regained w/ min A only.  Educated on use of RW for slow progression to allow for UE use when knee buckles.  Pt amb w/  reciprocal gait pattern and fluidity.  Pt performed standing marching as well as standing w/o UE support x 2 1/2" and slow unilateral shoulder flexion.  Verbl cues given for activation of buttocks, abdominals and quads w/ standing.  Pt does tend to shift weight over RLE as a habit.  Pt remained sitting in recliner, LEs elevated, seat alarm on and all needs in reach.     Therapy Documentation Precautions:  Precautions Precautions: Back Precaution Booklet Issued: No Precaution Comments: Watch compliance with back precautions, self-limits d/t pain/ nausea Restrictions Weight Bearing Restrictions: No General:   Vital Signs:   Pain:   Mobility:   Locomotion :    Trunk/Postural Assessment :    Balance:   Exercises:   Other Treatments:      Therapy/Group: Individual Therapy  [name] 02/13/2021, 1:56 PM

## 2021-02-13 NOTE — Progress Notes (Signed)
Occupational Therapy Session Note  Patient Details  Name: Louis [name] MRN: [id number] Date of Birth: 04-02-1966  Today's Date: 02/13/2021 OT Individual Time: 0900-0930 OT Individual Time Calculation (min): 30 min  and Today's Date: 02/13/2021 OT Missed Time: 30 Minutes Missed Time Reason: Patient unwilling/refused to participate without medical reason   Short Term Goals: Week 2:  OT Short Term Goal 1 (Week 2): STG = LTG 2/2 ELOS  Skilled Therapeutic Interventions/Progress Updates:    Pt resting in bed upon arrival. Pt declined OOB at this time. Pt currently hooked up on IV Vancomycin. Shorts already donned. Discussed discharge date and home setup. Donned ted hose at bed level. Educated pt on use of plastic bag to assist with donning ted hose. Pt states he is "ready to go home now." Pt declined use of toilet at this time. Pt remained in bed with all needs within reach.   Therapy Documentation Precautions:  Precautions Precautions: Back Precaution Booklet Issued: No Precaution Comments: Watch compliance with back precautions, self-limits d/t pain/ nausea Restrictions Weight Bearing Restrictions: No General: General OT Amount of Missed Time: 30 Minutes Vital Signs:   Pain: Pt denies pain this morning ("I'm jacked up on pain meds right now")   Therapy/Group: Individual Therapy  Rich Brave 02/13/2021, 10:04 AM

## 2021-02-13 NOTE — Progress Notes (Signed)
BLE venous has been completed.  Results can be found under chart review under CV PROC. 02/13/2021 4:59 PM Aaniyah Strohm RVT, RDMS

## 2021-02-14 DIAGNOSIS — R5381 Other malaise: Secondary | ICD-10-CM | POA: Diagnosis not present

## 2021-02-14 LAB — GLUCOSE, CAPILLARY
Glucose-Capillary: 215 mg/dL — ABNORMAL HIGH (ref 70–99)
Glucose-Capillary: 185 mg/dL — ABNORMAL HIGH (ref 70–99)
Glucose-Capillary: 145 mg/dL — ABNORMAL HIGH (ref 70–99)
Glucose-Capillary: 160 mg/dL — ABNORMAL HIGH (ref 70–99)

## 2021-02-14 LAB — CBC
HCT: 22.9 % — ABNORMAL LOW (ref 39.0–52.0)
Hemoglobin: 7.4 g/dL — ABNORMAL LOW (ref 13.0–17.0)
MCH: 26.9 pg (ref 26.0–34.0)
MCHC: 32.3 g/dL (ref 30.0–36.0)
MCV: 83.3 fL (ref 80.0–100.0)
Platelets: 440 10*3/uL — ABNORMAL HIGH (ref 150–400)
RBC: 2.75 MIL/uL — ABNORMAL LOW (ref 4.22–5.81)
RDW: 15.5 % (ref 11.5–15.5)
WBC: 5.2 10*3/uL (ref 4.0–10.5)
nRBC: 0 % (ref 0.0–0.2)

## 2021-02-14 LAB — IRON AND TIBC
Iron: 12 ug/dL — ABNORMAL LOW (ref 45–182)
Saturation Ratios: 5 % — ABNORMAL LOW (ref 17.9–39.5)
TIBC: 224 ug/dL — ABNORMAL LOW (ref 250–450)
UIBC: 212 ug/dL

## 2021-02-14 MED ORDER — FUROSEMIDE 20 MG PO TABS
20.0000 mg | ORAL_TABLET | Freq: Every day | ORAL | Status: AC
  Administered 2021-02-14 – 2021-02-16 (×3): 20 mg via ORAL
  Filled 2021-02-14 (×3): qty 1

## 2021-02-14 NOTE — Progress Notes (Signed)
Occupational Therapy Session Note  Patient Details  Name: Louis [name] MRN: [id number] Date of Birth: [birth year]  Today's Date: 02/14/2021 OT Individual Time: 0900-1000 OT Individual Time Calculation (min): 60 min    Short Term Goals: Week 2:  OT Short Term Goal 1 (Week 2): STG = LTG 2/2 ELOS  Skilled Therapeutic Interventions/Progress Updates:    Pt received in bed agreeable to therapy. Cued pt for safe supine to sit maneuver to avoid twisting his back.  Pt sat up and used RW with close S to transfer to recliner. Pt declined bathing and dressing stating he put on a fresh outfit last night.  Pt then used RW to ambulate to wc to sit at sink with close S to CGA.  Pt worked on oral care.  Donned TED hose for pt with education for his wife on use of bag method to increase ease with donning.  Pt then ambulated a few feet (10 ft) into hallway with RW.  Pt talked about how his knees have buckled when walking.  Pt educated on safest way to walk with RW with his arms at his side to be able to brace himself should his knee give out.  Pt then practiced this method walking back to his recliner with light CGA.   Pt resting in recliner with alarm on and all needs met.    Therapy Documentation Precautions:  Precautions Precautions: Back Precaution Booklet Issued: No Precaution Comments: Watch compliance with back precautions, self-limits d/t pain/ nausea Restrictions Weight Bearing Restrictions: No    Vital Signs: Therapy Vitals Temp: 98.2 F (36.8 C) Pulse Rate: 88 Resp: 16 BP: 134/85 Patient Position (if appropriate): Sitting Oxygen Therapy SpO2: 99 % O2 Device: Room Air Pain: Pain Assessment Pain Score: Asleep   Therapy/Group: Individual Therapy  Wright 02/14/2021, 8:28 AM

## 2021-02-14 NOTE — Progress Notes (Signed)
PROGRESS NOTE   Subjective/Complaints: Discussed normal doppler with pt/wife Also discussed nl kidney fx and persistently low Hgb   ROS: Denies CP, SOB, N/V/D  Objective:   VAS [place] LOWER EXTREMITY VENOUS (DVT)  Result Date: 02/13/2021  Lower Venous DVT Study Patient Name:  Louis [name]  Date of Exam:   02/13/2021 Medical Rec #: [id number]  Accession #:    [id number] Date of Birth: [birth year]   Patient Gender: M Patient Age:   54 years Exam Location:  [facility] Procedure:      VAS [place] LOWER EXTREMITY VENOUS (DVT) Referring Phys: Claudette Laws --------------------------------------------------------------------------------  Indications: Pain, and Swelling.  Comparison Study: Previous exam 01/22/16 @[facility]  RLE - Negative Performing Technologist: Jody Hill RVT, RDMS  Examination Guidelines: A complete evaluation includes B-mode imaging, spectral Doppler, color Doppler, and power Doppler as needed of all accessible portions of each vessel. Bilateral testing is considered an integral part of a complete examination. Limited examinations for reoccurring indications may be performed as noted. The reflux portion of the exam is performed with the patient in reverse Trendelenburg.  +---------+---------------+---------+-----------+----------+--------------+ RIGHT    CompressibilityPhasicitySpontaneityPropertiesThrombus Aging +---------+---------------+---------+-----------+----------+--------------+ CFV      Full           Yes      Yes                                 +---------+---------------+---------+-----------+----------+--------------+ SFJ      Full                                                        +---------+---------------+---------+-----------+----------+--------------+ FV Prox  Full           Yes      Yes                                 +---------+---------------+---------+-----------+----------+--------------+ FV  Mid   Full           Yes      Yes                                 +---------+---------------+---------+-----------+----------+--------------+ FV DistalFull           Yes      Yes                                 +---------+---------------+---------+-----------+----------+--------------+ PFV      Full                                                        +---------+---------------+---------+-----------+----------+--------------+ POP  Full           Yes      Yes                                 +---------+---------------+---------+-----------+----------+--------------+ PTV      Full                                                        +---------+---------------+---------+-----------+----------+--------------+ PERO     Full                                                        +---------+---------------+---------+-----------+----------+--------------+   +---------+---------------+---------+-----------+----------+--------------+ LEFT     CompressibilityPhasicitySpontaneityPropertiesThrombus Aging +---------+---------------+---------+-----------+----------+--------------+ CFV      Full           Yes      Yes                                 +---------+---------------+---------+-----------+----------+--------------+ SFJ      Full                                                        +---------+---------------+---------+-----------+----------+--------------+ FV Prox  Full           Yes      Yes                                 +---------+---------------+---------+-----------+----------+--------------+ FV Mid   Full           Yes      Yes                                 +---------+---------------+---------+-----------+----------+--------------+ FV DistalFull           Yes      Yes                                 +---------+---------------+---------+-----------+----------+--------------+ PFV      Full                                                         +---------+---------------+---------+-----------+----------+--------------+ POP      Full           Yes      Yes                                 +---------+---------------+---------+-----------+----------+--------------+ PTV      Full                                                        +---------+---------------+---------+-----------+----------+--------------+  PERO     Full                                                        +---------+---------------+---------+-----------+----------+--------------+     Summary: BILATERAL: - No evidence of deep vein thrombosis seen in the lower extremities, bilaterally. - No evidence of superficial venous thrombosis in the lower extremities, bilaterally. -No evidence of popliteal cyst, bilaterally. RIGHT: - Ultrasound characteristics of enlarged lymph nodes are noted in the groin. Subcutaneous edema throughout entire leg.  LEFT: - Ultrasound characteristics of enlarged lymph nodes noted in the groin. Subcutaneous edema throughout entire leg.  *See table(s) above for measurements and observations. Electronically signed by Waverly Ferrari MD on 02/13/2021 at 7:19:49 PM.    Final    Recent Labs    02/14/21 0322  WBC 5.2  HGB 7.4*  HCT 22.9*  PLT 440*    Recent Labs    02/13/21 1050  NA 127*  K 3.7  CL 95*  CO2 24  GLUCOSE 127*  BUN <5*  CREATININE 0.46*  CALCIUM 8.2*     Intake/Output Summary (Last 24 hours) at 02/14/2021 0835 Last data filed at 02/14/2021 0300 Gross per 24 hour  Intake 720 ml  Output 1400 ml  Net -680 ml          Physical Exam: Vital Signs Blood pressure 134/85, pulse 88, temperature 98.2 F (36.8 C), resp. rate 16, height 5\' 11"  (1.803 m), weight 102.7 kg, SpO2 99 %.   General: No acute distress Mood and affect are appropriate Heart: Regular rate and rhythm no rubs murmurs or extra sounds Lungs: Clear to auscultation, breathing unlabored, no rales or wheezes Abdomen:  Positive bowel sounds, soft nontender to palpation, nondistended Extremities: No clubbing, cyanosis, or edema  Skin: No evidence of breakdown, 0.5-1cm macules mainly at base of both palms and Right distal lateral 5th digit, no discharge, non tender, skin appears dry MSK- tenderness to light palpation Bilateral arms forearms , thighs and calves  Mood and affect approp Neuro: Alert and oriented Motor: Grossly 4/5 throughout, unchanged  Assessment/Plan: 1. Functional deficits which require 3+ hours per day of interdisciplinary therapy in a comprehensive inpatient rehab setting. Physiatrist is providing close team supervision and 24 hour management of active medical problems listed below. Physiatrist and rehab team continue to assess barriers to discharge/monitor patient progress toward functional and medical goals  Care Tool:  Bathing  Bathing activity did not occur: Refused Body parts bathed by patient: Right arm, Left arm, Chest, Abdomen, Right upper leg, Left upper leg, Face   Body parts bathed by helper: Front perineal area, Buttocks, Right lower leg, Left lower leg     Bathing assist Assist Level: Moderate Assistance - Patient 50 - 74%     Upper Body Dressing/Undressing Upper body dressing   What is the patient wearing?: Pull over shirt    Upper body assist Assist Level: Supervision/Verbal cueing    Lower Body Dressing/Undressing Lower body dressing      What is the patient wearing?: Pants, Underwear/pull up     Lower body assist Assist for lower body dressing: Maximal Assistance - Patient 25 - 49%     Toileting Toileting    Toileting assist Assist for toileting: Independent with assistive device Assistive Device Comment: Urinal   Transfers Chair/bed transfer  Transfers assist     Chair/bed transfer assist level: Minimal Assistance - Patient > 75% (stand pivot with RW)     Locomotion Ambulation   Ambulation assist      Assist level: Contact  Guard/Touching assist Assistive device: Walker-rolling Max distance: 35   Walk 10 feet activity   Assist  Walk 10 feet activity did not occur: Safety/medical concerns  Assist level: Contact Guard/Touching assist Assistive device: Walker-rolling   Walk 50 feet activity   Assist Walk 50 feet with 2 turns activity did not occur: Safety/medical concerns  Assist level: Total Assistance - Patient < 25%      Walk 150 feet activity   Assist Walk 150 feet activity did not occur: Safety/medical concerns         Walk 10 feet on uneven surface  activity   Assist Walk 10 feet on uneven surfaces activity did not occur: Safety/medical concerns         Wheelchair     Assist Is the patient using a wheelchair?: No Type of Wheelchair: Manual    Wheelchair assist level: Contact Guard/Touching assist Max wheelchair distance: 150    Wheelchair 50 feet with 2 turns activity    Assist        Assist Level: Contact Guard/Touching assist   Wheelchair 150 feet activity     Assist      Assist Level: Contact Guard/Touching assist   Blood pressure 134/85, pulse 88, temperature 98.2 F (36.8 C), resp. rate 16, height 5\' 11"  (1.803 m), weight 102.7 kg, SpO2 99 %.  Medical Problem List and Plan: 1.  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   Continue CIR PT OT, estimated length of stay 02/17/2021   2.  Antithrombotics: -DVT/anticoagulation:  Pharmaceutical: Lovenox- don't anticipate need at discharge              -antiplatelet therapy: Baby aspirin daily Lower extremity pain pretibial swelling ,Doppler venous ultrasound bilateral lower limbs nl  3. Pain Management: OxyContin 10 mg every 12 hours with oxycodone 5 mg every 4 hours as needed             -Robaxin 500 every 6 as needed ordered             -Voltaren gel for topical relief             -Added K pad for muscle spasm and low back pain              -Tylenol for mild pain -resume Toradol IM x3 days as needed, stop celebrex  4. Mood: Team to provide ego support             -continue Effexor XR 75 mg daily             -antipsychotic agents: N/A, had been haldol before 5. Neuropsych: This patient is capable of making decisions on his own behalf. -Patient's mental status appears to be improving  6. Skin/Wound Care: contact dermatitis, will start hydrocortisone cream , no change in distribution  7. Fluids/Electrolytes/Severe Protein malutrition: Patient now is on regular diet.  Encourage p.o. intake. 8.  Sepsis secondary to discitis and epidural abscess/bacteremia             -continue IV vancomycin through 9/15             -Monitor clinically and with regular labs 9.  Uncontrolled type 2 diabetes             -  Lantus insulin 5 units with sliding scale insulin coverage--increased to 10 units 8/6, increased to 15 on 8/12  CBG (last 3)  Recent Labs    02/13/21 1629 02/13/21 2129 02/14/21 0538  GLUCAP 260* 156* 215*   , increase lantus to 20U - improved  10.  Chronic diastolic congestive heart failure             -Monitor daily weights             -Anasarca most likely due to nutritional status as opposed to cardiac             -Recent 2D echo demonstrated no wall abnormalities   Filed Weights   01/31/21 0400 02/02/21 0630 02/03/21 0529  Weight: 98.3 kg 102.9 kg 102.7 kg   Stable on 8/11 11.  Hyponatremia- off Free H20  I/O looks ok  Sodium 126 asymptomatic              -Hypokalemia, improved , off lasix    12. Anemia d/t illness  Hemoglobin stable, check iron studies and stool guaic  given poor recovery of hgb Baseline 11.6 g 13. Thrombocytosis:  Platelets 526 on 8/4, improving  476K on 8/15, recheck in am  14.  Blood pressure   Vitals:   02/13/21 1920 02/14/21 0431  BP: 130/82 134/85  Pulse: 88 88  Resp: 16 16  Temp: 98.5 F (36.9 C) 98.2 F (36.8 C)  SpO2: 100% 99%  8/21 controlled  on Losartan 25mg  , Toprol XL 25mg   , Norvasc 5mg   Will increase amlodipine to 10mg  reduce losartan to 12.5 mg  15.  Myalgia - on statin will hold , nl CK, as discussed with patient this may take weeks to improve 16 peripheral edema LE associated with low alb, ? If amlodipine is contributing will, give lasix  LOS: 17 days A FACE TO FACE EVALUATION WAS PERFORMED  Louis [name] 02/14/2021, 8:35 AM

## 2021-02-14 NOTE — Progress Notes (Signed)
Physical Therapy Session Note  Patient Details  Name: Louis [name] MRN: [id number] Date of Birth: April 08, 1966  Today's Date: 02/14/2021 PT Individual Time: 0130-0203; [id number] PT Individual Time Calculation (min): 33 min; 34 mins   Short Term Goals: Week 1:  PT Short Term Goal 1 (Week 1): Pt will ambulate 29f minA PT Short Term Goal 1 - Progress (Week 1): Met PT Short Term Goal 2 (Week 1): Pt will complete sit to stand CGA PT Short Term Goal 2 - Progress (Week 1): Met PT Short Term Goal 3 (Week 1): Pt will be able to recall 3/3 back precautions PT Short Term Goal 3 - Progress (Week 1): Met PT Short Term Goal 4 (Week 1): Pt will be able to complete bed mobility MinA PT Short Term Goal 4 - Progress (Week 1): Progressing toward goal PT Short Term Goal 5 (Week 1): Pt will initiate stair training PT Short Term Goal 5 - Progress (Week 1): Progressing toward goal Week 2:  PT Short Term Goal 1 (Week 2): Pt will ambulate >380fper bout consistently with supervision using LRAD PT Short Term Goal 2 (Week 2): Pt will complete functional transfers using LRAD with supervision. PT Short Term Goal 3 (Week 2): Pt will demonstrate understanding of back precautions with 100% compliance. PT Short Term Goal 4 (Week 2): Pt will demonstrate bed mobility with MinA. PT Short Term Goal 5 (Week 2): Pt will initiate stair training.  Skilled Therapeutic Interventions/Progress Updates:  Session One: Pt received in recliner with IV pole connected and wife in room. Pt agreeable to therapy.   STS throughout session with close supervision and verbal cuing for hand placement to maintain back precautions. Initiated gait training out of recliner.   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 Pt states he is never sure  when it will give out but at home he was some times able to catch himself and other times would fall. No instances of knee buckling. Pt demonstrated slightly flexed posture and slow gait speed with deliberate step through pattern heavy use of BUE support on RW. This improved as the gait bouts progressed and the longest bout demonstrating a faster gait speed with good knee control bilaterally. Pt states he felt "more confident and better" with the last bout of gait training.   Stand pivot > recliner with RW CGA. All movements from session within back precautions. Pt in recliner with IV team in room at the end of session to disconnect IV pole. Call bell within reach, all needs met at this time.   Session Two: Pt received sitting in recliner with wife in room. Pt agreeable to therapy.  STS and Stand pivot transfers recliner>wc + wc>bed with CGA and verbal cuing for hand placement to maintain back precautions.   Stair training x4 6" steps, BUE support on handrails. CGA +2 for safety incase of spontaneous left knee buckle. Pt education provided on ascending stairs leading with the stronger leg and descending leading with the weaker leg technique and the importance of utilizing with home set up. Pt able to verbalize and demonstrate understanding. No knee buckle.  Pt transported back to room. EOB > supine with modA for BLE management 2/2 fatigue. Pt demonstrated ability to scoot towards HOB wiTrevose [facility] LLCuse of  bed railings and verbal cuing for hooklying and utilization of BLE. All movements during session maintained back precautions. Call bell within reach and all needs met at this time.   Therapy Documentation Precautions:  Precautions Precautions: Back Precaution Booklet Issued: No Precaution Comments: Watch compliance with back precautions, self-limits d/t pain/ nausea Restrictions Weight Bearing Restrictions: No  Pain: Pain Assessment Pain Scale: 0-10 Pain Score: 5  Faces Pain Scale: No  hurt   Therapy/Group: Individual Therapy  Louis [name], SPT 02/14/2021, 2:09 PM

## 2021-02-14 NOTE — Progress Notes (Signed)
Patient slept fairly well so far this shift. No complaints voiced. Pt questioned about pain meds this am, declined stated "I'm okay right now".

## 2021-02-15 DIAGNOSIS — R5381 Other malaise: Secondary | ICD-10-CM | POA: Diagnosis not present

## 2021-02-15 LAB — BASIC METABOLIC PANEL
Anion gap: 6 (ref 5–15)
BUN: <5 mg/dL — ABNORMAL LOW (ref 6–20)
CO2: 25 mmol/L (ref 22–32)
Calcium: 8.2 mg/dL — ABNORMAL LOW (ref 8.9–10.3)
Chloride: 94 mmol/L — ABNORMAL LOW (ref 98–111)
Creatinine, Ser: 0.32 mg/dL — ABNORMAL LOW (ref 0.61–1.24)
GFR, Estimated: >60 mL/min (ref >60)
Glucose, Bld: 100 mg/dL — ABNORMAL HIGH (ref 70–99)
Potassium: 3.3 mmol/L — ABNORMAL LOW (ref 3.5–5.1)
Sodium: 125 mmol/L — ABNORMAL LOW (ref 135–145)

## 2021-02-15 LAB — GLUCOSE, CAPILLARY
Glucose-Capillary: 115 mg/dL — ABNORMAL HIGH (ref 70–99)
Glucose-Capillary: 133 mg/dL — ABNORMAL HIGH (ref 70–99)
Glucose-Capillary: 197 mg/dL — ABNORMAL HIGH (ref 70–99)
Glucose-Capillary: 97 mg/dL (ref 70–99)

## 2021-02-15 LAB — C-REACTIVE PROTEIN: CRP: 5 mg/dL — ABNORMAL HIGH (ref <1.0)

## 2021-02-15 MED ORDER — KETOROLAC TROMETHAMINE 60 MG/2ML IM SOLN
30.0000 mg | Freq: Once | INTRAMUSCULAR | Status: AC
  Administered 2021-02-15: 30 mg via INTRAMUSCULAR
  Filled 2021-02-15: qty 2

## 2021-02-15 MED ORDER — VANCOMYCIN IV (FOR PTA / DISCHARGE USE ONLY): 1500.0000 mg | Freq: Two times a day (BID) | INTRAVENOUS | 0 refills | Status: AC

## 2021-02-15 MED ORDER — OXYCODONE HCL ER 10 MG PO T12A
10.0000 mg | EXTENDED_RELEASE_TABLET | Freq: Every day | ORAL | Status: DC
Start: 2021-02-16 — End: 2021-02-20
  Administered 2021-02-16 – 2021-02-20 (×5): 10 mg via ORAL
  Filled 2021-02-15 (×5): qty 1

## 2021-02-15 MED ORDER — VANCOMYCIN HCL 1500 MG/300ML IV SOLN
1500.0000 mg | Freq: Two times a day (BID) | INTRAVENOUS | Status: DC
Start: 2021-02-15 — End: 2021-02-20
  Administered 2021-02-15 – 2021-02-20 (×11): 1500 mg via INTRAVENOUS
  Filled 2021-02-15 (×11): qty 300

## 2021-02-15 MED ORDER — POTASSIUM CHLORIDE CRYS ER 20 MEQ PO TBCR
20.0000 meq | EXTENDED_RELEASE_TABLET | Freq: Two times a day (BID) | ORAL | Status: AC
  Administered 2021-02-15 – 2021-02-16 (×4): 20 meq via ORAL
  Filled 2021-02-15 (×4): qty 1

## 2021-02-15 NOTE — Progress Notes (Signed)
PROGRESS NOTE   Subjective/Complaints:  Voiding frequently on lasix , discussed low K+ ROS: Denies CP, SOB, N/V/D  Objective:   VAS [place] LOWER EXTREMITY VENOUS (DVT)  Result Date: 02/13/2021  Lower Venous DVT Study Patient Name:  Louis [name]  Date of Exam:   02/13/2021 Medical Rec #: [id number]  Accession #:    [id number] Date of Birth: 03-30-1966   Patient Gender: M Patient Age:   55 years Exam Location:  [facility] Procedure:      VAS [place] LOWER EXTREMITY VENOUS (DVT) Referring Phys: Claudette Laws --------------------------------------------------------------------------------  Indications: Pain, and Swelling.  Comparison Study: Previous exam 01/22/16 @[facility]  RLE - Negative Performing Technologist: Jody Hill RVT, RDMS  Examination Guidelines: A complete evaluation includes B-mode imaging, spectral Doppler, color Doppler, and power Doppler as needed of all accessible portions of each vessel. Bilateral testing is considered an integral part of a complete examination. Limited examinations for reoccurring indications may be performed as noted. The reflux portion of the exam is performed with the patient in reverse Trendelenburg.  +---------+---------------+---------+-----------+----------+--------------+ RIGHT    CompressibilityPhasicitySpontaneityPropertiesThrombus Aging +---------+---------------+---------+-----------+----------+--------------+ CFV      Full           Yes      Yes                                 +---------+---------------+---------+-----------+----------+--------------+ SFJ      Full                                                        +---------+---------------+---------+-----------+----------+--------------+ FV Prox  Full           Yes      Yes                                 +---------+---------------+---------+-----------+----------+--------------+ FV Mid   Full           Yes      Yes                                  +---------+---------------+---------+-----------+----------+--------------+ FV DistalFull           Yes      Yes                                 +---------+---------------+---------+-----------+----------+--------------+ PFV      Full                                                        +---------+---------------+---------+-----------+----------+--------------+ POP      Full  Yes      Yes                                 +---------+---------------+---------+-----------+----------+--------------+ PTV      Full                                                        +---------+---------------+---------+-----------+----------+--------------+ PERO     Full                                                        +---------+---------------+---------+-----------+----------+--------------+   +---------+---------------+---------+-----------+----------+--------------+ LEFT     CompressibilityPhasicitySpontaneityPropertiesThrombus Aging +---------+---------------+---------+-----------+----------+--------------+ CFV      Full           Yes      Yes                                 +---------+---------------+---------+-----------+----------+--------------+ SFJ      Full                                                        +---------+---------------+---------+-----------+----------+--------------+ FV Prox  Full           Yes      Yes                                 +---------+---------------+---------+-----------+----------+--------------+ FV Mid   Full           Yes      Yes                                 +---------+---------------+---------+-----------+----------+--------------+ FV DistalFull           Yes      Yes                                 +---------+---------------+---------+-----------+----------+--------------+ PFV      Full                                                         +---------+---------------+---------+-----------+----------+--------------+ POP      Full           Yes      Yes                                 +---------+---------------+---------+-----------+----------+--------------+ PTV      Full                                                        +---------+---------------+---------+-----------+----------+--------------+  PERO     Full                                                        +---------+---------------+---------+-----------+----------+--------------+     Summary: BILATERAL: - No evidence of deep vein thrombosis seen in the lower extremities, bilaterally. - No evidence of superficial venous thrombosis in the lower extremities, bilaterally. -No evidence of popliteal cyst, bilaterally. RIGHT: - Ultrasound characteristics of enlarged lymph nodes are noted in the groin. Subcutaneous edema throughout entire leg.  LEFT: - Ultrasound characteristics of enlarged lymph nodes noted in the groin. Subcutaneous edema throughout entire leg.  *See table(s) above for measurements and observations. Electronically signed by Waverly Ferrari MD on 02/13/2021 at 7:19:49 PM.    Final    Recent Labs    02/14/21 0322  WBC 5.2  HGB 7.4*  HCT 22.9*  PLT 440*    Recent Labs    02/13/21 1050 02/15/21 0409  NA 127* 125*  K 3.7 3.3*  CL 95* 94*  CO2 24 25  GLUCOSE 127* 100*  BUN <5* <5*  CREATININE 0.46* 0.32*  CALCIUM 8.2* 8.2*     Intake/Output Summary (Last 24 hours) at 02/15/2021 0829 Last data filed at 02/15/2021 0651 Gross per 24 hour  Intake 1607.74 ml  Output 2675 ml  Net -1067.26 ml          Physical Exam: Vital Signs Blood pressure 136/81, pulse 87, temperature 97.9 F (36.6 C), temperature source Oral, resp. rate 18, height 5\' 11"  (1.803 m), weight 102.7 kg, SpO2 99 %.   General: No acute distress Mood and affect are appropriate Heart: Regular rate and rhythm no rubs murmurs or extra sounds Lungs: Clear to  auscultation, breathing unlabored, no rales or wheezes Abdomen: Positive bowel sounds, soft nontender to palpation, nondistended Extremities: No clubbing, cyanosis, or edema Skin: No evidence of breakdown, no evidence of rash  Skin: No evidence of breakdown, 0.5-1cm macules mainly at base of both palms and Right distal lateral 5th digit, no discharge, non tender, skin appears dry MSK- tenderness to light palpation Bilateral arms forearms , thighs and calves  Mood and affect approp Neuro: Alert and oriented Motor: Grossly 4/5 throughout, unchanged  Assessment/Plan: 1. Functional deficits which require 3+ hours per day of interdisciplinary therapy in a comprehensive inpatient rehab setting. Physiatrist is providing close team supervision and 24 hour management of active medical problems listed below. Physiatrist and rehab team continue to assess barriers to discharge/monitor patient progress toward functional and medical goals  Care Tool:  Bathing  Bathing activity did not occur: Refused Body parts bathed by patient: Right arm, Left arm, Chest, Abdomen, Right upper leg, Left upper leg, Face   Body parts bathed by helper: Front perineal area, Buttocks, Right lower leg, Left lower leg     Bathing assist Assist Level: Moderate Assistance - Patient 50 - 74%     Upper Body Dressing/Undressing Upper body dressing   What is the patient wearing?: Pull over shirt    Upper body assist Assist Level: Supervision/Verbal cueing    Lower Body Dressing/Undressing Lower body dressing      What is the patient wearing?: Pants, Underwear/pull up     Lower body assist Assist for lower body dressing: Maximal Assistance - Patient 25 - 49%     Toileting Toileting  Toileting assist Assist for toileting: Independent with assistive device Assistive Device Comment: Urinal   Transfers Chair/bed transfer  Transfers assist     Chair/bed transfer assist level: Minimal Assistance - Patient >  75% (stand pivot with RW)     Locomotion Ambulation   Ambulation assist      Assist level: Contact Guard/Touching assist Assistive device: Walker-rolling Max distance: 35   Walk 10 feet activity   Assist  Walk 10 feet activity did not occur: Safety/medical concerns  Assist level: Contact Guard/Touching assist Assistive device: Walker-rolling   Walk 50 feet activity   Assist Walk 50 feet with 2 turns activity did not occur: Safety/medical concerns  Assist level: Total Assistance - Patient < 25%      Walk 150 feet activity   Assist Walk 150 feet activity did not occur: Safety/medical concerns         Walk 10 feet on uneven surface  activity   Assist Walk 10 feet on uneven surfaces activity did not occur: Safety/medical concerns         Wheelchair     Assist Is the patient using a wheelchair?: No Type of Wheelchair: Manual    Wheelchair assist level: Contact Guard/Touching assist Max wheelchair distance: 150    Wheelchair 50 feet with 2 turns activity    Assist        Assist Level: Contact Guard/Touching assist   Wheelchair 150 feet activity     Assist      Assist Level: Contact Guard/Touching assist   Blood pressure 136/81, pulse 87, temperature 97.9 F (36.6 C), temperature source Oral, resp. rate 18, height 5\' 11"  (1.803 m), weight 102.7 kg, SpO2 99 %.  Medical Problem List and Plan: 1.  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   Continue CIR PT OT, estimated length of stay 02/17/2021   2.  Antithrombotics: -DVT/anticoagulation:  Pharmaceutical: Lovenox- don't anticipate need at discharge              -antiplatelet therapy: Baby aspirin daily Lower extremity pain pretibial swelling ,Doppler venous ultrasound bilateral lower limbs nl  3. Pain Management: OxyContin 10 mg every 12 hours with oxycodone 5 mg every 4 hours as needed Would d/c oxycontin at discharge ,  increase oxyIR 5mg  QID x 7d, TID x 7d, BID x 7d, daily x 7d- pt can see PCP if he needs additional pain meds              -Robaxin 500 every 6 as needed ordered             -Voltaren gel for topical relief             -Added K pad for muscle spasm and low back pain             -Tylenol for mild pain -resume Toradol IM x3 days as needed, stop celebrex , may use prn ibuprofen at home  4. Mood: Team to provide ego support             -continue Effexor XR 75 mg daily             -antipsychotic agents: N/A, had been haldol before 5. Neuropsych: This patient is capable of making decisions on his own behalf. -Patient's mental status appears to be improving  6. Skin/Wound Care: contact dermatitis, will start hydrocortisone cream , no change in distribution  7. Fluids/Electrolytes/Severe Protein malutrition: Patient now is on regular diet.  Encourage  p.o. intake. 8.  Sepsis secondary to discitis and epidural abscess/bacteremia             -continue IV vancomycin through 9/15- f/u with ID prior to 9/15             -Monitor clinically and with regular labs 9.  Uncontrolled type 2 diabetes             -Lantus insulin 5 units with sliding scale insulin coverage--increased to 10 units 8/6, increased to 15 on 8/12  CBG (last 3)  Recent Labs    02/14/21 1641 02/14/21 2114 02/15/21 0606  GLUCAP 145* 160* 115*   , increase lantus to 20U - improved  10.  Chronic diastolic congestive heart failure             -Monitor daily weights             -Anasarca most likely due to nutritional status as opposed to cardiac             -Recent 2D echo demonstrated no wall abnormalities   Filed Weights   01/31/21 0400 02/02/21 0630 02/03/21 0529  Weight: 98.3 kg 102.9 kg 102.7 kg   Stable on 8/11 11.  Hyponatremia- off Free H20  I/O looks ok  Sodium 126 asymptomatic , some worsening on Lasix but will be off of this med soon             -Hypokalemia, improved , off lasix    12. Anemia d/t illness  Hemoglobin  stable, check iron studies and stool guaic  given poor recovery of hgb Baseline 11.6 g 13. Thrombocytosis:  Platelets 526 on 8/4, improving  476K on 8/15, recheck in am  14.  Blood pressure   Vitals:   02/14/21 1932 02/15/21 0446  BP: 138/88 136/81  Pulse: 88 87  Resp: 18 18  Temp: (!) 97.1 F (36.2 C) 97.9 F (36.6 C)  SpO2: 100% 99%  8/21 controlled  on Losartan 25mg  , Toprol XL 25mg  , Norvasc 5mg   Will increase amlodipine to 10mg  reduce losartan to 12.5 mg  15.  Myalgia - on statin will hold , nl CK, as discussed with patient this may take weeks to improve 16 peripheral edema LE associated with low alb, ? If amlodipine is contributing will, give lasix  17.  Hypokalemia will supplement may be related to lasix which has been added short term for peripheral edema  LOS: 18 days A FACE TO FACE EVALUATION WAS PERFORMED  Erick Colacendrew E Kyann Heydt 02/15/2021, 8:29 AM

## 2021-02-15 NOTE — Progress Notes (Signed)
Physical Therapy Session Note  Patient Details  Name: Louis [name] MRN: [id number] Date of Birth: 1965/11/25  Today's Date: 02/15/2021 PT Individual Time:  0916-1030  PT Individual Time Calculation (min): 74 min    Short Term Goals: Week 2:  PT Short Term Goal 1 (Week 2): Pt will ambulate >40ft per bout consistently with supervision using LRAD PT Short Term Goal 2 (Week 2): Pt will complete functional transfers using LRAD with supervision. PT Short Term Goal 3 (Week 2): Pt will demonstrate understanding of back precautions with 100% compliance. PT Short Term Goal 4 (Week 2): Pt will demonstrate bed mobility with MinA. PT Short Term Goal 5 (Week 2): Pt will initiate stair training.   Skilled Therapeutic Interventions/Progress Updates:  Patient supine in bed on entrance to room. Wife present for family education. Patient alert but relates pain in R anterior thigh that pt allows to limit his mobility this session. Pt request pain medication prior to session. RN notified and medications provided. Pt continues to complain of pain and requests continued explanation for why he cannot receive the one medication that has provided him with pain relief since coming to CIR. Msg sent to MD and MD provided orders to nsg after session for IM injection of Toradol.   Therapeutic Activity: Pt's wife verbally educated on pt's chronic L knee pain and pt's largest pain/ weakness complaint. Wife is mostly concerned with pt's ability to perform steps to enter home. She is less concerned since performing steps with therapy in session yesterday, but still has not appreciated for herself. Related to wife that pt will be much more determined and focused to complete steps at home in order to finally be at home. Wife in agreement. Educated wife that she will need to position self behind pt on ascent of steps with hands on pt's hip bones to steady and to have LLE one step higher than RLE to provide Support in even that pt's L  knee does buckle. Demonstrated where to place L hand above knee in order to steady/ guard L knee if concern for buckling is high.   Also educated on hand placement with use of gait belt for ambulation as he will need to ambulate often throughout each day. Do not allow pt to sit or lie supine for too long as ambulation is best for pt's overall progress. Home health is scheduled for next step in therapy progress and to expect visists 3x/ week until pt demos improvement to continue on to OP care. Wife relates that [employer] was company who was able to reach their home the last time they needed home health services.  Bed Mobility: Patient performed supine <> sit with HHA/ supervision. VC/ tc required for technique. Transfers: Patient performed STS and SPVT transfer from bed to recliner with close supervision. Provided verbal cues for reaching back to armrests to control descent. Wife educated on safe technique for pt to perform. Pt informed that she has been performing assist with good technique during pt's stay.   Therapeutic Exercise: Pt provided with manual therapy along with PROM for attempted decrease in ant thigh pain. Pt also guided in AROM and AAROM into seated hip flexion and knee extension for mobilization of quadriceps muscle tissue.  Patient seated upright  in recliner at end of session with brakes locked, wife present, and all needs within reach. OT taking over session for continued family education.     Therapy Documentation Precautions:  Precautions Precautions: Back Precaution Booklet Issued: No Precaution Comments:  Watch compliance with back precautions, self-limits d/t pain/ nausea Restrictions Weight Bearing Restrictions: No  Therapy/Group: Individual Therapy  Loel Dubonnet 02/15/2021, 7:55 AM

## 2021-02-15 NOTE — Progress Notes (Signed)
PHARMACY CONSULT NOTE FOR:  OUTPATIENT  PARENTERAL ANTIBIOTIC THERAPY (OPAT)  Indication: MRSA Bacteremia + discitis  Regimen: Vancomycin 1500 mg IV q12h End date: 03/10/21  IV antibiotic discharge orders are pended. To discharging provider:  please sign these orders via discharge navigator,  Select New Orders & click on the button choice - Manage This Unsigned Work.     Thank you for allowing pharmacy to be a part of this patient's care.  Elwin Sleight 02/15/2021, 12:11 PM

## 2021-02-15 NOTE — Progress Notes (Signed)
Occupational Therapy Session Note  Patient Details  Name: Louis [name] MRN: [id number] Date of Birth: 1966/04/14  Today's Date: 02/15/2021 OT Individual Time: 7680-8811 OT Individual Time Calculation (min): 76 min    Short Term Goals: Week 2:  OT Short Term Goal 1 (Week 2): STG = LTG 2/2 ELOS  Skilled Therapeutic Interventions/Progress Updates:    Pt received seated in recliner, agreeable to therapy, wife present for family edu Session focus on activity tolerance, family education, BUE/BLE strengthening in prep for improved ADL/IADL/func mobility performance + decreased caregiver burden. Pt c/o ongoing RLE pain, LPN present to administer pain rx. Cont to decline OOB therapy despite encouragement and education re decreasing BLE swelling, risk for DVT, risk for pressure sores, maintaining conditioning, etc. Reviewed HEP to remain active at home to decrease these risks. Agreeable to seated therex. Completed 1x15 of B  ankle pumps, foot slides, seated marches, chest openers. Pt with significant difficulty completing exercises with RUE/RLE 2/2 pain from pain rx injection and ongoing pain. Reviewed with wife home set-up, rec DME/AE, ADL performance, back precaution adherence, and posterior method for entering walk-in shower. Pt declining to complete today, will attempt tomorrow.   Pt left seated in recliner with wife present, call bell in reach, and all immediate needs met.    Therapy Documentation Precautions:  Precautions Precautions: Back Precaution Booklet Issued: No Precaution Comments: Watch compliance with back precautions, self-limits d/t pain/ nausea Restrictions Weight Bearing Restrictions: No  Pain: ongoing RLE pain, did not rate   ADL: See Care Tool for more details.   Therapy/Group: Individual Therapy  Volanda Napoleon MS, OTR/L  02/15/2021, 6:41 AM

## 2021-02-15 NOTE — Progress Notes (Signed)
Occupational Therapy Discharge Summary  Patient Details  Name: Louis [name] MRN: [id number] Date of Birth: 03/10/1966  Today's Date: 02/16/2021 OT Individual Time: 5053-9767 OT Individual Time Calculation (min): 16 min  and Today's Date: 02/16/2021 OT Missed Time: 3 Minutes Missed Time Reason: Patient unwilling/refused to participate without medical reason;Other (comment) (bed pan)   Patient has met 5 of 12 long term goals due to improved activity tolerance, improved balance, postural control, ability to compensate for deficits, and improved coordination.  Patient to discharge at overall  S level for transfers and frequently max A for LBD ADL  .  Patient's care partner is independent to provide the necessary physical assistance at discharge.    Reasons goals not met: Pt cont to be primarily limited by generalized deconditioning, ongoing back/BLE pain, and nausea. Frequently declined OOB therapy 2/2 above limitations throughout CIR stay limiting functional progress. Pt is able to complete ADL/func transfers at the below documented levels for his typical performance.   Recommendation:  Patient will benefit from ongoing skilled OT services in home health setting to continue to advance functional skills in the area of BADL, iADL, and Reduce care partner burden.  Equipment: 3in1  Reasons for discharge: discharge from hospital  Patient/family agrees with progress made and goals achieved: Yes  OT Discharge Precautions/Restrictions  Precautions Precautions: Back Restrictions Weight Bearing Restrictions: No  Pain Pain Assessment Pain Scale: 0-10 Pain Score: 9  Pain Type: Chronic pain Pain Location: Back Pain Orientation: Lower Pain Descriptors / Indicators: Aching Pain Frequency: Constant Pain Intervention(s): Medication (See eMAR) ADL ADL Eating: Set up Where Assessed-Eating: Chair Grooming: Setup Where Assessed-Grooming: Chair Upper Body Bathing: Not assessed Lower Body Bathing:  Not assessed Upper Body Dressing: Minimal assistance Where Assessed-Upper Body Dressing: Bed level Lower Body Dressing: Maximal assistance Where Assessed-Lower Body Dressing: Bed level ADL Comments: patient declined shower / bathing due to fatigue, able to use urinal with set up Vision Baseline Vision/History: 0 No visual deficits Patient Visual Report: No change from baseline Vision Assessment?: No apparent visual deficits Perception  Perception: Within Functional Limits Praxis Praxis: Intact Cognition Overall Cognitive Status: Within Functional Limits for tasks assessed Arousal/Alertness: Awake/alert Orientation Level: Oriented X4 Year: 2022 Month: August Day of Week: Correct Immediate Memory Recall: Sock;Blue;Bed Memory Recall Sock: Without Cue Memory Recall Blue: Without Cue Memory Recall Bed: Without Cue Problem Solving: Appears intact Safety/Judgment: Appears intact Sensation Sensation Light Touch: Appears Intact Hot/Cold: Appears Intact Proprioception: Appears Intact Stereognosis: Appears Intact Coordination Gross Motor Movements are Fluid and Coordinated: No Fine Motor Movements are Fluid and Coordinated: Yes Coordination and Movement Description: limited by generalized deconditioning and ongoing back/BLE pain Motor  Motor Motor: Other (comment) Motor - Discharge Observations: limited by generalized deconditioning and ongoing back/BLE pain, improved gross motor coordination from eval Mobility  Bed Mobility Bed Mobility: Supine to Sit;Sit to Supine Supine to Sit: Supervision/Verbal cueing Sit to Supine: Supervision/Verbal cueing Transfers Sit to Stand: Supervision/Verbal cueing  Trunk/Postural Assessment  Cervical Assessment Cervical Assessment: Within Functional Limits Thoracic Assessment Thoracic Assessment: Exceptions to [facility] (mildly rounded shoulders) Lumbar Assessment Lumbar Assessment: Exceptions to [facility] (mild posterior pelvic tilt) Postural  Control Postural Control: Deficits on evaluation (decreased with dynamic standing tasks 2/2 generalized deconditioning/pain)  Balance Balance Balance Assessed: Yes Static Sitting Balance Static Sitting - Balance Support: Feet supported Static Sitting - Level of Assistance: 5: Stand by assistance Static Sitting - Comment/# of Minutes: S 2/2 hx of dizziness Dynamic Sitting Balance Dynamic Sitting - Balance Support: Feet supported Dynamic  Sitting Balance - Compensations: S Static Standing Balance Static Standing - Balance Support: Bilateral upper extremity supported Static Standing - Level of Assistance: 5: Stand by assistance Static Standing - Comment/# of Minutes: S Dynamic Standing Balance Dynamic Standing - Balance Support: Bilateral upper extremity supported Dynamic Standing - Level of Assistance: 5: Stand by assistance Dynamic Standing - Comments: S Extremity/Trunk Assessment RUE Assessment RUE Assessment: Within Functional Limits LUE Assessment LUE Assessment: Within Functional Limits  Session Note: Pt received semi-reclined on bed pain with wife present. Req extended time to complete BM. Declined assist to toilet at this time. Cont to check back with pt. Pt momentarily agreeable to remove bed pan, cont to decline assistance to toilet. Req suppository, LPN made aware. Pt required total A for posterior pericare and to remove bed pan. Pt immediately req to replace bed pan again 2/2 need for BM again, able to place ind. Issued reacher to pt wife and discussed use for LBD. Additionally, emphasized need to encourage hourly pressure relief stretches/strategies 2/2 new sacral wound per LPN (STS, transferring, side to side stretching, etc.). Wife verbalized understanding.   Pt left semi-reclined in bed with ca with wife present , call bell in reach, and all immediate needs met.    Volanda Napoleon MS, OTR/L  02/16/2021, 10:47 AM

## 2021-02-16 DIAGNOSIS — E876 Hypokalemia: Secondary | ICD-10-CM

## 2021-02-16 DIAGNOSIS — R5381 Other malaise: Secondary | ICD-10-CM | POA: Diagnosis not present

## 2021-02-16 DIAGNOSIS — I5032 Chronic diastolic (congestive) heart failure: Secondary | ICD-10-CM | POA: Diagnosis not present

## 2021-02-16 DIAGNOSIS — E1165 Type 2 diabetes mellitus with hyperglycemia: Secondary | ICD-10-CM | POA: Diagnosis not present

## 2021-02-16 LAB — GLUCOSE, CAPILLARY
Glucose-Capillary: 116 mg/dL — ABNORMAL HIGH (ref 70–99)
Glucose-Capillary: 130 mg/dL — ABNORMAL HIGH (ref 70–99)
Glucose-Capillary: 108 mg/dL — ABNORMAL HIGH (ref 70–99)
Glucose-Capillary: 118 mg/dL — ABNORMAL HIGH (ref 70–99)

## 2021-02-16 LAB — OCCULT BLOOD X 1 CARD TO LAB, STOOL: Fecal Occult Bld: NEGATIVE

## 2021-02-16 NOTE — Plan of Care (Signed)
  Problem: RH Bed to Chair Transfers Goal: LTG Patient will perform bed/chair transfers w/assist (PT) Description: LTG: Patient will perform bed to chair transfers with assistance (PT). Flowsheets (Taken 02/15/2021 1005) LTG: Pt will perform Bed to Chair Transfers with assistance level: Supervision/Verbal cueing   Problem: RH Ambulation Goal: LTG Patient will ambulate in controlled environment (PT) Description: LTG: Patient will ambulate in a controlled environment, # of feet with assistance (PT). Flowsheets (Taken 02/15/2021 1005) LTG: Ambulation distance in controlled environment: 34ft Goal: LTG Patient will ambulate in home environment (PT) Description: LTG: Patient will ambulate in home environment, # of feet with assistance (PT). Flowsheets Taken 02/15/2021 1005 by Golden Pop, PT LTG: Ambulation distance in home environment: 25 Taken 01/29/2021 1448 by Annita Brod, Student-PT LTG: Pt will ambulate in home environ  assist needed:: Supervision/Verbal cueing

## 2021-02-16 NOTE — Progress Notes (Addendum)
Patient ID: [name], male   DOB: 12/25/1965, 55 y.o.   MRN: [id number]  This SW covering for primary SW, Lavera Guise.  SW ordered w/c with [facility] via parachute.  SW confirmed with Pam[facility] no issues with abx. Will confirm with Cory[facility] if updated ordered are needed.  *SW sent order to Cory[facility] for HHPT/OT/SN.   *SW received updates from Christina/[facility] reporting w/c was cancelled since they indicated will purchase cheaper. SW confirmed with pt wife. They will purchase a similar w/c online.  Cecile Sheerer, MSW, LCSWA Office: [phone] Cell: [phone] Fax: [phone]

## 2021-02-16 NOTE — Progress Notes (Signed)
PROGRESS NOTE   Subjective/Complaints:  No new issues. Pain is better controlled. Happy to be going home. Asked about a wound on his "crack" that the nurse said "needed to be lanced"  ROS: Patient denies fever, rash, sore throat, blurred vision, nausea, vomiting, diarrhea, cough, shortness of breath or chest pain,   headache, or mood change.   Objective:   No results found. Recent Labs    02/14/21 0322  WBC 5.2  HGB 7.4*  HCT 22.9*  PLT 440*   Recent Labs    02/13/21 1050 02/15/21 0409  NA 127* 125*  K 3.7 3.3*  CL 95* 94*  CO2 24 25  GLUCOSE 127* 100*  BUN <5* <5*  CREATININE 0.46* 0.32*  CALCIUM 8.2* 8.2*    Intake/Output Summary (Last 24 hours) at 02/16/2021 0816 Last data filed at 02/16/2021 0030 Gross per 24 hour  Intake 1320 ml  Output 300 ml  Net 1020 ml         Physical Exam: Vital Signs Blood pressure (!) 145/88, pulse 93, temperature 97.9 F (36.6 C), resp. rate 18, height 5\' 11"  (1.803 m), weight 102.7 kg, SpO2 99 %.   Constitutional: No distress . Vital signs reviewed. HEENT: NCAT, EOMI, oral membranes moist Neck: supple Cardiovascular: RRR without murmur. No JVD    Respiratory/Chest: CTA Bilaterally without wheezes or rales. Normal effort    GI/Abdomen: BS +, non-tender, non-distended Ext: no clubbing, cyanosis, or edema Psych: pleasant and cooperative  Skin: No evidence of breakdown, 0.5-1cm macules mainly at base of both palms and Right distal lateral 5th digit, no discharge, non tender, skin appears dry. Examined sacrum and gluteal folds. Other than some redness I saw no signs of breakdown, blister MSK- tenderness to light palpation Bilateral arms forearms , thighs and calves  Mood and affect approp Neuro: Alert and oriented Motor: Grossly 4/5 throughout, stable  Assessment/Plan: 1. Functional deficits which require 3+ hours per day of interdisciplinary therapy in a comprehensive  inpatient rehab setting. Physiatrist is providing close team supervision and 24 hour management of active medical problems listed below. Physiatrist and rehab team continue to assess barriers to discharge/monitor patient progress toward functional and medical goals  Care Tool:  Bathing  Bathing activity did not occur: Refused Body parts bathed by patient: Right arm, Left arm, Chest, Abdomen, Right upper leg, Left upper leg, Face   Body parts bathed by helper: Front perineal area, Buttocks, Right lower leg, Left lower leg     Bathing assist Assist Level: Moderate Assistance - Patient 50 - 74%     Upper Body Dressing/Undressing Upper body dressing   What is the patient wearing?: Pull over shirt    Upper body assist Assist Level: Supervision/Verbal cueing    Lower Body Dressing/Undressing Lower body dressing      What is the patient wearing?: Pants, Underwear/pull up     Lower body assist Assist for lower body dressing: Maximal Assistance - Patient 25 - 49%     Toileting Toileting    Toileting assist Assist for toileting: Independent with assistive device Assistive Device Comment: Urinal   Transfers Chair/bed transfer  Transfers assist     Chair/bed transfer assist  level: Minimal Assistance - Patient > 75% (stand pivot with RW)     Locomotion Ambulation   Ambulation assist      Assist level: Contact Guard/Touching assist Assistive device: Walker-rolling Max distance: 35   Walk 10 feet activity   Assist  Walk 10 feet activity did not occur: Safety/medical concerns  Assist level: Contact Guard/Touching assist Assistive device: Walker-rolling   Walk 50 feet activity   Assist Walk 50 feet with 2 turns activity did not occur: Safety/medical concerns  Assist level: Total Assistance - Patient < 25%      Walk 150 feet activity   Assist Walk 150 feet activity did not occur: Safety/medical concerns         Walk 10 feet on uneven surface   activity   Assist Walk 10 feet on uneven surfaces activity did not occur: Safety/medical concerns         Wheelchair     Assist Is the patient using a wheelchair?: No Type of Wheelchair: Manual    Wheelchair assist level: Contact Guard/Touching assist Max wheelchair distance: 150    Wheelchair 50 feet with 2 turns activity    Assist        Assist Level: Contact Guard/Touching assist   Wheelchair 150 feet activity     Assist      Assist Level: Contact Guard/Touching assist   Blood pressure (!) 145/88, pulse 93, temperature 97.9 F (36.6 C), resp. rate 18, height 5\' 11"  (1.803 m), weight 102.7 kg, SpO2 99 %.  Medical Problem List and Plan: 1.  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   Continue CIR PT OT, estimated length of stay 02/17/2021   2.  Antithrombotics: -DVT/anticoagulation:  Pharmaceutical: Lovenox- don't anticipate need at discharge              -antiplatelet therapy: Baby aspirin daily Lower extremity pain pretibial swelling ,Doppler venous ultrasound bilateral lower limbs nl  3. Pain Management: OxyContin 10 mg every 12 hours with oxycodone 5 mg every 4 hours as needed Would d/c oxycontin at discharge , increase oxyIR 5mg  QID x 7d, TID x 7d, BID x 7d, daily x 7d- pt can see PCP if he needs additional pain meds              -Robaxin 500 every 6 as needed ordered             -Voltaren gel for topical relief             -Added K pad for muscle spasm and low back pain             -Tylenol for mild pain -  may use prn ibuprofen at home  -improved 4. Mood: Team to provide ego support             -continue Effexor XR 75 mg daily             -antipsychotic agents: N/A, had been haldol before 5. Neuropsych: This patient is capable of making decisions on his own behalf. -Patient's mental status appears better  6. Skin/Wound Care: contact dermatitis, will start hydrocortisone cream , no change  in distribution  7. Fluids/Electrolytes/Severe Protein malutrition: Patient now is on regular diet.  Encourage p.o. intake. 8.  Sepsis secondary to discitis and epidural abscess/bacteremia             -continue IV vancomycin through 9/15- f/u with ID prior to 9/15             -  Monitor clinically and with regular labs 9.  Uncontrolled type 2 diabetes             -Lantus insulin 5 units with sliding scale insulin coverage--increased to 10 units 8/6, increased to 15 on 8/12  CBG (last 3)  Recent Labs    02/15/21 1654 02/15/21 2107 02/16/21 0639  GLUCAP 197* 97 116*  , increase lantus to 20U - improved 8/24 10.  Chronic diastolic congestive heart failure             -Monitor daily weights             -Anasarca most likely due to nutritional status as opposed to cardiac             -Recent 2D echo demonstrated no wall abnormalities   Filed Weights   01/31/21 0400 02/02/21 0630 02/03/21 0529  Weight: 98.3 kg 102.9 kg 102.7 kg   Stable on 8/11 11.  Hyponatremia- off Free H20  I/O looks ok  Sodium 126 asymptomatic , some worsening on Lasix but will be off of this med soon             -Hypokalemia, improved , off lasix   8/24 still needs supplements---recheck tomorrow 12. Anemia d/t illness  Hemoglobin stable, check iron studies and stool guaic  given poor recovery of hgb Baseline 11.6 g 13. Thrombocytosis:  Platelets 526 on 8/4, improving  476K on 8/15, recheck in am  14.  Blood pressure   Vitals:   02/15/21 2012 02/16/21 0530  BP: 133/89 (!) 145/88  Pulse: 82 93  Resp: 18 18  Temp: 97.8 F (36.6 C) 97.9 F (36.6 C)  SpO2: 100% 99%  8/24 controlled  on Losartan 12.5mg  , Toprol XL 25mg  , Norvasc 10mg     15.  Myalgia - on statin will hold , nl CK, as discussed with patient this may take weeks to improve 16 peripheral edema LE associated with low alb, ? If amlodipine is contributing will, give lasix   LOS: 19 days A FACE TO FACE EVALUATION WAS PERFORMED  02/16/2021, 8:16 AM

## 2021-02-16 NOTE — Plan of Care (Signed)
Problem: RH Balance Goal: LTG Patient will maintain dynamic standing with ADLs (OT) Description: LTG:  Patient will maintain dynamic standing balance with assist during activities of daily living (OT)  Outcome: Not Met (add Reason) Flowsheets (Taken 02/16/2021 1226) LTG: Pt will maintain dynamic standing balance during ADLs with: (Pt cont to be significantly limited by ongoing back/BLE pain.) -- Note: Pt cont to be significantly limited by ongoing back/BLE pain.   Problem: Sit to Stand Goal: LTG:  Patient will perform sit to stand in prep for activites of daily living with assistance level (OT) Description: LTG:  Patient will perform sit to stand in prep for activites of daily living with assistance level (OT) Outcome: Not Met (add Reason) Flowsheets (Taken 02/16/2021 1226) LTG: PT will perform sit to stand in prep for activites of daily living with assistance level: (Pt cont to be significantly limited by ongoing back/BLE pain.) -- Note: Pt cont to be significantly limited by ongoing back/BLE pain.   Problem: RH Bathing Goal: LTG Patient will bathe all body parts with assist levels (OT) Description: LTG: Patient will bathe all body parts with assist levels (OT) Outcome: Not Met (add Reason) Flowsheets (Taken 02/16/2021 1226) LTG: Pt will perform bathing with assistance level/cueing: (Pt cont to be significantly limited by ongoing back/BLE pain.) -- Note: Pt cont to be significantly limited by ongoing back/BLE pain.   Problem: RH Dressing Goal: LTG Patient will perform lower body dressing w/assist (OT) Description: LTG: Patient will perform lower body dressing with assist, with/without cues in positioning using equipment (OT) Outcome: Not Met (add Reason) Flowsheets (Taken 02/16/2021 1226) LTG: Pt will perform lower body dressing with assistance level of: (Pt cont to be significantly limited by ongoing back/BLE pain.) -- Note: Pt cont to be significantly limited by ongoing back/BLE  pain.   Problem: RH Toileting Goal: LTG Patient will perform toileting task (3/3 steps) with assistance level (OT) Description: LTG: Patient will perform toileting task (3/3 steps) with assistance level (OT)  Outcome: Not Met (add Reason) Flowsheets (Taken 02/16/2021 1226) LTG: Pt will perform toileting task (3/3 steps) with assistance level: (Pt cont to be significantly limited by ongoing back/BLE pain.) -- Note: Pt cont to be significantly limited by ongoing back/BLE pain.   Problem: RH Simple Meal Prep Goal: LTG Patient will perform simple meal prep w/assist (OT) Description: LTG: Patient will perform simple meal prep with assistance, with/without cues (OT). Outcome: Not Met (add Reason) Flowsheets (Taken 02/16/2021 1226) LTG: Pt will perform simple meal prep with assistance level of: (Pt cont to be significantly limited by ongoing back/BLE pain.) -- Note: Pt cont to be significantly limited by ongoing back/BLE pain.   Problem: RH Balance Goal: LTG: Patient will maintain dynamic sitting balance (OT) Description: LTG:  Patient will maintain dynamic sitting balance with assistance during activities of daily living (OT) Outcome: Completed/Met   Problem: RH Grooming Goal: LTG Patient will perform grooming w/assist,cues/equip (OT) Description: LTG: Patient will perform grooming with assist, with/without cues using equipment (OT) Outcome: Completed/Met   Problem: RH Dressing Goal: LTG Patient will perform upper body dressing (OT) Description: LTG Patient will perform upper body dressing with assist, with/without cues (OT). Outcome: Completed/Met   Problem: RH Toilet Transfers Goal: LTG Patient will perform toilet transfers w/assist (OT) Description: LTG: Patient will perform toilet transfers with assist, with/without cues using equipment (OT) Outcome: Completed/Met   Problem: RH Tub/Shower Transfers Goal: LTG Patient will perform tub/shower transfers w/assist (OT) Description: LTG:  Patient will perform tub/shower transfers with  assist, with/without cues using equipment (OT) Outcome: Completed/Met

## 2021-02-16 NOTE — Patient Care Conference (Signed)
Inpatient RehabilitationTeam Conference and Plan of Care Update Date: 02/16/2021   Time: 10:01 AM    Patient Name: Louis [name]      Medical Record Number: [id number]  Date of Birth: 1965-11-07 Sex: Male         Room/Bed: 4W02C/4W02C-01 Payor Info: Payor: [employer] / Plan: [employer] PPO / Product Type: *No Product type* /    Admit Date/Time:  01/28/2021  6:24 PM  Primary Diagnosis:  Debility  Hospital Problems: Principal Problem:   Debility Active Problems:   Epidural abscess   Pressure injury of skin   Chronic diastolic congestive heart failure (HCC)   Uncontrolled type 2 diabetes mellitus with hyperglycemia (HCC)   Acute midline low back pain without sciatica   Labile blood pressure    Expected Discharge Date: Expected Discharge Date: 02/17/21  Team Members Present: Physician leading conference: Dr. Sula Soda Social Worker Present: Cecile Sheerer, LCSWA Nurse Present: Chana Bode, RN PT Present: Grier Rocher, PT OT Present: Annye English, OT SLP Present: Eilene Ghazi, SLP PPS Coordinator present : Fae Pippin, SLP     Current Status/Progress Goal Weekly Team Focus  Bowel/Bladder   Continent of B/B, LBM per pt 8/21, refuses prn laxatives (last documented BM-8/19)  Remain continent of B/B, regain regular B/B pattern  Assess B/B every shift and prn, assist with toileting needs   Swallow/Nutrition/ Hydration             ADL's   mod I with urinal, seated grooming, UBD, UBB, S  stand-pivot toilet transfer with RW, min A for toileting tasks at toilet, max A LBD; cont to be primarily limited by ongoing pain, frequently decling OOB activity  S for showering/bathroom transfers, toileting, light meal prep, mod I remaining ADL  self-care/balance/transfer retraining, pt/family/DME/AE education, energy conservation   Mobility   Very limited progress d/t pt related headache or LE pain. Pt severely self limits d/t pain - wife substantiates. Bed  mobility = Min A; transfers = clsoe supervision/ CGA, ambulation = close supervision/ CGA with RW, stair negotiation with CGA for four 6" steps using BHR.  overall Mod I/ supervision -- had potential to reach but self-limiting behavior will keep pt from reaching all LTG.  Slated d/c for tomorrow (Thu 8/25)   Communication             Safety/Cognition/ Behavioral Observations            Pain   Reports of constant pain to back radiating to legs, managed with oxycodone and ultram-partially effective  Pain <3/10  Assess and address pain every shift and prn   Skin   Stage I wound to sacrum-healing  Promote healing, no further episodes of skin impairment.  Assess skin every shift and prn     Discharge Planning:  Discharging home with spouse able to provide superivison to Min A, 24/7   Team Discussion: Progress limited by ongoing pain. Continue IV abx through 02/23/21. Md monitoring medical issues Patient on target to meet rehab goals: Grooming. Dependent for lower body ADLs and mod assist for upper body dressing. Requires set up for self feeding. Goals for discharge set at supervision - CGA.  *See Care Plan and progress notes for long and short-term goals.   Revisions to Treatment Plan:  Downgraded goals for transfers and gait  Teaching Needs: Safety, Medications/insulin administration, transfers, etc  Current Barriers to Discharge: Decreased caregiver support, Home enviroment access/layout, and IV antibiotics  Possible Resolutions to Barriers: Family education  Medical Summary Current Status: IV antibiotics, peripheral edema, myalgia, hypertension, thrombocytosis  Barriers to Discharge: IV antibiotics;Medical stability  Barriers to Discharge Comments: IV antibiotics, peripheral edema, myalgia, hypertension, thrombocytosis Possible Resolutions to [employer] Focus: continue IV antibiitoics, consider medication changes given edema, continue losartan/toprol/norvasc, continue to  monitor platelet count   Continued Need for Acute Rehabilitation Level of Care: The patient requires daily medical management by a physician with specialized training in physical medicine and rehabilitation for the following reasons: Direction of a multidisciplinary physical rehabilitation program to maximize functional independence : Yes Medical management of patient stability for increased activity during participation in an intensive rehabilitation regime.: Yes Analysis of laboratory values and/or radiology reports with any subsequent need for medication adjustment and/or medical intervention. : Yes   I attest that I was present, lead the team conference, and concur with the assessment and plan of the team.   Chana Bode B 02/16/2021, 1:54 PM

## 2021-02-16 NOTE — Plan of Care (Cosign Needed)
  Problem: RH Balance Goal: LTG Patient will maintain dynamic standing balance (PT) Description: LTG:  Patient will maintain dynamic standing balance with assistance during mobility activities (PT) Outcome: Adequate for Discharge   Problem: Sit to Stand Goal: LTG:  Patient will perform sit to stand with assistance level (PT) Description: LTG:  Patient will perform sit to stand with assistance level (PT) Outcome: Adequate for Discharge   Problem: RH Ambulation Goal: LTG Patient will ambulate in controlled environment (PT) Description: LTG: Patient will ambulate in a controlled environment, # of feet with assistance (PT). Outcome: Adequate for Discharge   Problem: RH Stairs Goal: LTG Patient will ambulate up and down stairs w/assist (PT) Description: LTG: Patient will ambulate up and down # of stairs with assistance (PT) Outcome: Adequate for Discharge   Problem: RH Wheelchair Mobility Goal: LTG Patient will propel w/c in controlled environment (PT) Description: LTG: Patient will propel wheelchair in controlled environment, # of feet with assist (PT) Outcome: Not Met (add Reason) Reason: Pt did not meet wheelchair goal 2/2 endurance deficits and limited participation in therapy sessions 2/2 high pain levels/nausea/dizziness  Problem: RH Balance Goal: LTG Patient will maintain dynamic sitting balance (PT) Description: LTG:  Patient will maintain dynamic sitting balance with assistance during mobility activities (PT) Outcome: Completed/Met   Problem: RH Bed Mobility Goal: LTG Patient will perform bed mobility with assist (PT) Description: LTG: Patient will perform bed mobility with assistance, with/without cues (PT). Outcome: Completed/Met   Problem: RH Bed to Chair Transfers Goal: LTG Patient will perform bed/chair transfers w/assist (PT) Description: LTG: Patient will perform bed to chair transfers with assistance (PT). Outcome: Completed/Met   Problem: RH Car Transfers Goal:  LTG Patient will perform car transfers with assist (PT) Description: LTG: Patient will perform car transfers with assistance (PT). Outcome: Completed/Met   Problem: RH Ambulation Goal: LTG Patient will ambulate in home environment (PT) Description: LTG: Patient will ambulate in home environment, # of feet with assistance (PT). Outcome: Completed/Met

## 2021-02-16 NOTE — Progress Notes (Signed)
Bowel program started at 0945. Patient refused dig stimulation. Patient tolerated well. Patient placed on bed pan. Doree Fudge, LPN

## 2021-02-16 NOTE — Progress Notes (Signed)
Physical Therapy Discharge Summary  Patient Details  Name: Louis [name] MRN: [id number] Date of Birth: 06/19/1966  Today's Date: 02/16/2021 PT Individual Time: 0400-0410 PT Individual Time Calculation (min): 10 min    Patient has met 5 of 10, 4/10 adequate for discharge long term goals due to improved activity tolerance, improved balance, improved postural control, increased strength, ability to compensate for deficits, and functional use of  left lower extremity.  Patient to discharge at an ambulatory level Supervision.   Patient's care partner is independent to provide the necessary physical assistance at discharge.   Reasons goals not met: Pt did not meet wheelchair goal 2/2 endurance deficits and limited participation in therapy sessions 2/2 high pain levels/nausea/dizziness. Dynamic standing balance, Sit to stand, Ambulation, and standing goals are adequate for discharge due to requiring CGA (goal for supervision) 2/2 chronic left knee instability with uncontrolled/randomized instances of buckling requiring CGA for overall safety which wife is aware and is able to be provide CGA by wife.   Recommendation:  Patient will benefit from ongoing skilled PT services in home health setting to continue to advance safe functional mobility, address ongoing impairments in overall deconditioning, pain management, strength, coordination, endurance, and minimize fall risk.  Equipment: N/A Per Social Work note:  "*SW received updates from [employer] reporting w/c was cancelled since they indicated will purchase cheaper. SW confirmed with pt wife. They will purchase a similar w/c online."  Reasons for discharge: discharge from hospital  Patient/family agrees with progress made and goals achieved: Yes  Skilled Intervention:  Attempted to see pt for AM session. Upon entry pt on bedpan requesting more time/privacy to complete task. While in hall OT requesting time to compete discharge  assessment/wrap up education with pt prior to dc. Will shift schedule to accommodate missed 30 minutes later in day.   Therapist attempted to initiate treatment in afternoon. Upon entry, pt reports increased pain and "I now have a large tear near my anus. They had to put a pad on it." Pt states pain limiting ability to complete mobility 2/2 wound location, however, willing to try after pain medication kicks in. RN in room to administer pain medication.   (Start time 4:30) Therapist returned to room for re-attempt 30 minutes later. Pt reports continued pain, however, willing to try "what we can." Pt assisted in donning shorts while supine in bed. Pt states needing to have BM. Therapist assisted in doffing shorts, pt partially doffed underwear in supine. Rolling towards the right independently with verbal cuing to ensure maintenance of back precautions while therapist positioned bedpan. Therapist checked placement of bedpan. Pt requesting privacy. Hand off to NT in room.   Per 8/22 session with this therapist, pt has demonstrated overall improvements since evaluation with ability to ambulate with RW and close supervision for safety over 158ft. STS to RW with supervision. Stair navigation with CGA, close +2 for safety in case of knee buckle (however, no knee instability noted) for 4 6" steps. Stand pivot with RW or walking transfers with RW and supervision. Despite limited ability to participate in therapy session 2/2 high pain levels, nausea, and dizziness over course of stay, pt has demonstrated improvements in all aspects of his mobility compared to evaluation and will continue to benefit from skilled HHPT 2/2 overall deconditioning and impairments listed above.    PT Discharge Precautions/Restrictions Precautions Precautions: Back Precaution Booklet Issued: No Precaution Comments: Back precautions, limted by pain Restrictions Weight Bearing Restrictions: No Pain Pain Assessment Pain Scale:  0-10 Pain Score: 8  Faces Pain Scale: Hurts even more Pain Type: Acute pain Pain Location: Buttocks Pain Orientation: Lower Pain Descriptors / Indicators: Sharp;Aching;Constant (Pt states new pain from "large tear near my anus") Pain Frequency: Constant Pain Intervention(s): Medication (See eMAR);Pain med given for lower pain score than stated, per patient request;RN made aware Pain Interference Pain Interference Pain Effect on Sleep: 3. Frequently Pain Interference with Therapy Activities: 4. Almost constantly Pain Interference with Day-to-Day Activities: 4. Almost constantly Vision/Perception  Vision - History Ability to See in Adequate Light: 0 Adequate Vision - Assessment Eye Alignment: Within Functional Limits Ocular Range of Motion: Within Functional Limits Alignment/Gaze Preference: Within Defined Limits Tracking/Visual Pursuits: Able to track stimulus in all quads without difficulty Saccades: Within functional limits Convergence: Within functional limits Perception Perception: Within Functional Limits Praxis Praxis: Intact  Cognition Overall Cognitive Status: Within Functional Limits for tasks assessed Arousal/Alertness: Awake/alert Orientation Level: Oriented X4 Year: 2022 Month: August Day of Week: Correct Safety/Judgment: Appears intact Sensation Sensation Light Touch: Appears Intact Coordination Gross Motor Movements are Fluid and Coordinated: No Coordination and Movement Description: limited by generalized deconditioning and ongoing back/BLE pain Heel Shin Test: Decreased speed to complete task Motor  Motor Motor: Other (comment) Motor - Discharge Observations: limited by generalized deconditioning and ongoing back/BLE pain, improved gross motor coordination and endurance from eval  Mobility Bed Mobility Bed Mobility: Supine to Sit;Sit to Supine Rolling Right: Supervision/verbal cueing Supine to Sit: Supervision/Verbal cueing Sit to Supine:  Supervision/Verbal cueing Transfers Transfers: Sit to Stand;Stand Pivot Transfers;Stand to Sit Sit to Stand: Supervision/Verbal cueing Stand to Sit: Supervision/Verbal cueing Stand Pivot Transfers: Supervision/Verbal cueing Stand Pivot Transfer Details: Verbal cues for precautions/safety;Verbal cues for safe use of DME/AE Transfer (Assistive device): Rolling walker Trunk/Postural Assessment  Cervical Assessment Cervical Assessment: Exceptions to [facility] (mildly forward head) Thoracic Assessment Thoracic Assessment: Exceptions to [facility] (mildly rounded shoulders) Lumbar Assessment Lumbar Assessment: Exceptions to [facility] (mild posterior pelvic tilt) Postural Control Postural Control:  (decreased 2/2 generalized deconditioning and pain) Protective Responses: delayed Postural Limitations: decreased  Balance Balance Balance Assessed: Yes Static Sitting Balance Static Sitting - Balance Support: Feet supported Static Sitting - Level of Assistance: 5: Stand by assistance Static Sitting - Comment/# of Minutes: Supervision 2/2 hx of dizziness and abrupt transition to supine 2/2 unrelenting pain. Dynamic Sitting Balance Dynamic Sitting - Balance Support: Feet supported Dynamic Sitting - Level of Assistance: 5: Stand by assistance Static Standing Balance Static Standing - Balance Support: Bilateral upper extremity supported Static Standing - Level of Assistance: 5: Stand by assistance Dynamic Standing Balance Dynamic Standing - Balance Support: Bilateral upper extremity supported Dynamic Standing - Level of Assistance: 5: Stand by assistance Extremity Assessment  RLE Assessment General Strength Comments: WFL, generalized deconditioning/pain. Improved from evaluation as evident by decreased level of assist required for functional mobility LLE Assessment General Strength Comments: WFL, generalized deconditioning/pain. Improved from evaluation as evident by decreased level of assist required for  functional mobility    Kiora Hallberg, SPT 02/16/2021, 4:30 PM

## 2021-02-17 DIAGNOSIS — R0989 Other specified symptoms and signs involving the circulatory and respiratory systems: Secondary | ICD-10-CM | POA: Diagnosis not present

## 2021-02-17 DIAGNOSIS — R7881 Bacteremia: Secondary | ICD-10-CM | POA: Diagnosis not present

## 2021-02-17 DIAGNOSIS — R5381 Other malaise: Secondary | ICD-10-CM | POA: Diagnosis not present

## 2021-02-17 DIAGNOSIS — E871 Hypo-osmolality and hyponatremia: Secondary | ICD-10-CM | POA: Diagnosis not present

## 2021-02-17 LAB — BASIC METABOLIC PANEL
Anion gap: 10 (ref 5–15)
BUN: <5 mg/dL — ABNORMAL LOW (ref 6–20)
CO2: 23 mmol/L (ref 22–32)
Calcium: 8.3 mg/dL — ABNORMAL LOW (ref 8.9–10.3)
Chloride: 91 mmol/L — ABNORMAL LOW (ref 98–111)
Creatinine, Ser: 0.35 mg/dL — ABNORMAL LOW (ref 0.61–1.24)
GFR, Estimated: >60 mL/min (ref >60)
Glucose, Bld: 113 mg/dL — ABNORMAL HIGH (ref 70–99)
Potassium: 3.5 mmol/L (ref 3.5–5.1)
Sodium: 124 mmol/L — ABNORMAL LOW (ref 135–145)

## 2021-02-17 LAB — TSH: TSH: 1.889 u[IU]/mL (ref 0.350–4.500)

## 2021-02-17 LAB — VANCOMYCIN, TROUGH: Vancomycin Tr: 15 ug/mL (ref 15–20)

## 2021-02-17 LAB — GLUCOSE, CAPILLARY
Glucose-Capillary: 112 mg/dL — ABNORMAL HIGH (ref 70–99)
Glucose-Capillary: 164 mg/dL — ABNORMAL HIGH (ref 70–99)
Glucose-Capillary: 153 mg/dL — ABNORMAL HIGH (ref 70–99)
Glucose-Capillary: 114 mg/dL — ABNORMAL HIGH (ref 70–99)

## 2021-02-17 LAB — OCCULT BLOOD X 1 CARD TO LAB, STOOL: Fecal Occult Bld: NEGATIVE

## 2021-02-17 LAB — OSMOLALITY: Osmolality: 260 mOsm/kg — ABNORMAL LOW (ref 275–295)

## 2021-02-17 MED ORDER — VANCOMYCIN HCL 1500 MG/300ML IV SOLN: 1500.0000 mg | Freq: Two times a day (BID) | INTRAVENOUS | Status: DC

## 2021-02-17 MED ORDER — OXYCODONE HCL 5 MG PO TABS
5.0000 mg | ORAL_TABLET | Freq: Four times a day (QID) | ORAL | Status: DC | PRN
  Administered 2021-02-18 – 2021-02-19 (×5): 5 mg via ORAL
  Filled 2021-02-17 (×5): qty 1

## 2021-02-17 MED ORDER — LOSARTAN POTASSIUM 25 MG PO TABS
12.5000 mg | ORAL_TABLET | Freq: Every day | ORAL | Status: DC
  Administered 2021-02-18 – 2021-02-20 (×3): 12.5 mg via ORAL
  Filled 2021-02-17 (×3): qty 0.5

## 2021-02-17 MED ORDER — HEPARIN SOD (PORK) LOCK FLUSH 100 UNIT/ML IV SOLN
250.0000 [IU] | INTRAVENOUS | Status: DC | PRN
  Filled 2021-02-17: qty 2.5

## 2021-02-17 MED ORDER — ACETAMINOPHEN 325 MG PO TABS: 325.0000 mg | ORAL_TABLET | ORAL | Status: AC | PRN

## 2021-02-17 MED ORDER — POLYSACCHARIDE IRON COMPLEX 150 MG PO CAPS
150.0000 mg | ORAL_CAPSULE | Freq: Every day | ORAL | Status: DC
  Administered 2021-02-17 – 2021-02-20 (×4): 150 mg via ORAL
  Filled 2021-02-17 (×4): qty 1

## 2021-02-17 MED ORDER — PSYLLIUM 95 % PO PACK: 1.0000 | PACK | Freq: Every day | ORAL | Status: DC

## 2021-02-17 MED ORDER — KETOROLAC TROMETHAMINE 10 MG PO TABS
10.0000 mg | ORAL_TABLET | Freq: Every day | ORAL | Status: DC | PRN
  Filled 2021-02-17: qty 1

## 2021-02-17 NOTE — Discharge Instructions (Addendum)
Inpatient Rehab Discharge Instructions  Louis [name] Discharge date and time:    Activities/Precautions/ Functional Status: Activity: no lifting, driving, or strenuous exercise till cleared by MD Diet: diabetic diet Limit fluids to 1200 cc/day Wound Care: keep wound clean and dry   Functional status:  ___ No restrictions     ___ Walk up steps independently _X__ 24/7 supervision/assistance   ___ Walk up steps with assistance ___ Intermittent supervision/assistance  ___ Bathe/dress independently ___ Walk with walker     ___ Bathe/dress with assistance ___ Walk Independently    ___ Shower independently ___ Walk with assistance    _X__ Shower with assistance _X__ No alcohol     ___ Return to work/school ________  Special Instructions: No driving or strenuous till cleared by MD Monitor BS before meals and at bedtime. Follow up wit PCP for adjustment of insulin and follow up on sodium levels.    My questions have been answered and I understand these instructions. I will adhere to these goals and the provided educational materials after my discharge from the hospital.  Patient/Caregiver Signature _______________________________ Date __________  Clinician Signature _______________________________________ Date __________  Please bring this form and your medication list with you to all your follow-up doctor's appointments.

## 2021-02-17 NOTE — Progress Notes (Signed)
Nutrition Follow-up  DOCUMENTATION CODES:   Severe malnutrition in context of acute illness/injury  INTERVENTION:  Continue Ensure Max po BID, each supplement provides 150 kcal and 30 grams of protein.    Continue Magic cup TID with meals, each supplement provides 290 kcal and 9 grams of protein   Continue Juven BID, each packet provides 95 calories, 2.5 grams of protein.   Encourage adequate PO intake.   NUTRITION DIAGNOSIS:   Severe Malnutrition related to acute illness (MRSA bacteremia) as evidenced by mild fat depletion, moderate muscle depletion, energy intake < or equal to 50% for > or equal to 5 days; ongoing  GOAL:   Patient will meet greater than or equal to 90% of their needs; met  MONITOR:   PO intake, Supplement acceptance, Skin, Weight trends, Labs, I & O's  REASON FOR ASSESSMENT:   Malnutrition Screening Tool    ASSESSMENT:   55 yo male admitted to rehab 8/5 from [facility] acute care where he was admitted for worsening back pain after laminectomy on 6/28.  Drain was placed for subdural fluid collections. Patient was treated for MRSA bacteremia. TEE negative for vegetation. PMH includes DM, CAD, HTN, HLD, sleep apnea.  Meal completion has been 100%. Pt tolerating his PO diet. Pt currently has Ensure Max and Juven ordered with varied consumption. RD to continue with current orders to aid in caloric and protein needs.   Labs and medications reviewed.   Diet Order:   Diet Order             Diet heart healthy/carb modified Room service appropriate? Yes; Fluid consistency: Thin  Diet effective now                   EDUCATION NEEDS:   Education needs have been addressed  Skin:  Skin Assessment: Reviewed RN Assessment Skin Integrity Issues:: Stage II Stage II: coccyx  Last BM:  8/24  Height:   Ht Readings from Last 1 Encounters:  01/28/21 $RemoveB'5\' 11"'qiqtKSpw$  (1.803 m)    Weight:   Wt Readings from Last 1 Encounters:  02/17/21 95 kg   BMI:  Body mass index  is 29.21 kg/m.  Estimated Nutritional Needs:   Kcal:  2200-2400  Protein:  120-140 gm  Fluid:  >/= 2.2 L  Corrin Parker, MS, RD, LDN RD pager number/after hours weekend pager number on Amion.

## 2021-02-17 NOTE — Progress Notes (Signed)
PHARMACY CONSULT NOTE FOR:  OUTPATIENT  PARENTERAL ANTIBIOTIC THERAPY (OPAT)  Indication: MRSA Bacteremia + discitis  Regimen: Vancomycin 1500 mg IV q12h End date: 03/10/21  IV antibiotic discharge orders are pended. To discharging provider:  please sign these orders via discharge navigator,  Select New Orders & click on the button choice - Manage This Unsigned Work.   Vancomycin trough this morning 8/25 15 mcg/dL - ok to continue vancomycin 1500 mg iv Q 12 hours    Thank you for allowing pharmacy to be a part of this patient's care.  Elwin Sleight 02/17/2021, 8:58 AM

## 2021-02-17 NOTE — Progress Notes (Addendum)
PROGRESS NOTE   Subjective/Complaints: Doing well. No complaints. Asked if he could have toradol to use at home if pain is severe. Legs still sore, too  ROS: Patient denies fever, rash, sore throat, blurred vision, nausea, vomiting, diarrhea, cough, shortness of breath or chest pain,   headache, or mood change.   Objective:   No results found. No results for input(s): WBC, HGB, HCT, PLT in the last 72 hours.  Recent Labs    02/15/21 0409 02/17/21 0745  NA 125* 124*  K 3.3* 3.5  CL 94* 91*  CO2 25 23  GLUCOSE 100* 113*  BUN <5* <5*  CREATININE 0.32* 0.35*  CALCIUM 8.2* 8.3*    Intake/Output Summary (Last 24 hours) at 02/17/2021 7408 Last data filed at 02/17/2021 0849 Gross per 24 hour  Intake 1777.77 ml  Output 3150 ml  Net -1372.23 ml         Physical Exam: Vital Signs Blood pressure 124/67, pulse 85, temperature 98.8 F (37.1 C), temperature source Oral, resp. rate 17, height 5\' 11"  (1.803 m), weight 102.7 kg, SpO2 98 %.   Constitutional: No distress . Vital signs reviewed. HEENT: NCAT, EOMI, oral membranes moist Neck: supple Cardiovascular: RRR without murmur. No JVD    Respiratory/Chest: CTA Bilaterally without wheezes or rales. Normal effort    GI/Abdomen: BS +, non-tender, non-distended Ext: no clubbing, cyanosis. LE edema Psych: pleasant and cooperative   Skin: No evidence of breakdown, 0.5-1cm macules mainly at base of both palms and Right distal lateral 5th digit, no discharge, non tender, skin appears dry. Examined sacrum and gluteal folds. Other than some redness I saw no signs of breakdown, blister MSK- tenderness to palpation Bilateral arms forearms , thighs and calves --improved Mood and affect approp Neuro: Alert and oriented Motor: Grossly 4/5 throughout, stable  Assessment/Plan: 1. Functional deficits which require 3+ hours per day of interdisciplinary therapy in a comprehensive inpatient  rehab setting. Physiatrist is providing close team supervision and 24 hour management of active medical problems listed below. Physiatrist and rehab team continue to assess barriers to discharge/monitor patient progress toward functional and medical goals  Care Tool:  Bathing  Bathing activity did not occur: Refused Body parts bathed by patient: Right arm, Left arm, Chest, Abdomen, Right upper leg, Left upper leg, Face   Body parts bathed by helper: Front perineal area, Buttocks, Right lower leg, Left lower leg     Bathing assist Assist Level: Moderate Assistance - Patient 50 - 74%     Upper Body Dressing/Undressing Upper body dressing   What is the patient wearing?: Pull over shirt    Upper body assist Assist Level: Independent    Lower Body Dressing/Undressing Lower body dressing      What is the patient wearing?: Pants, Underwear/pull up     Lower body assist Assist for lower body dressing: Maximal Assistance - Patient 25 - 49%     Toileting Toileting    Toileting assist Assist for toileting: Maximal Assistance - Patient 25 - 49% Assistive Device Comment: bed pan   Transfers Chair/bed transfer  Transfers assist     Chair/bed transfer assist level: Supervision/Verbal cueing     Locomotion  Ambulation   Ambulation assist      Assist level: Supervision/Verbal cueing Assistive device: Walker-rolling Max distance: 100   Walk 10 feet activity   Assist  Walk 10 feet activity did not occur: Safety/medical concerns  Assist level: Supervision/Verbal cueing Assistive device: Walker-rolling   Walk 50 feet activity   Assist Walk 50 feet with 2 turns activity did not occur: Safety/medical concerns  Assist level: Supervision/Verbal cueing Assistive device: Walker-rolling    Walk 150 feet activity   Assist Walk 150 feet activity did not occur: Safety/medical concerns  Assist level: Contact Guard/Touching assist Assistive device: Walker-rolling     Walk 10 feet on uneven surface  activity   Assist Walk 10 feet on uneven surfaces activity did not occur: Safety/medical concerns   Assist level: Supervision/Verbal cueing Assistive device: Walker-rolling   Wheelchair     Assist Is the patient using a wheelchair?: No Type of Wheelchair: Manual    Wheelchair assist level: Supervision/Verbal cueing Max wheelchair distance: 150    Wheelchair 50 feet with 2 turns activity    Assist        Assist Level: Supervision/Verbal cueing   Wheelchair 150 feet activity     Assist      Assist Level: Supervision/Verbal cueing   Blood pressure 124/67, pulse 85, temperature 98.8 F (37.1 C), temperature source Oral, resp. rate 17, height 5\' 11"  (1.803 m), weight 102.7 kg, SpO2 98 %.  Medical Problem List and Plan: 1.  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   Continue CIR PT OT, estimated length of stay 02/17/2021  -f/u with [facility], ID, PCP, HH pending plan with hyponatremia below  2.  Antithrombotics: -DVT/anticoagulation:  Pharmaceutical: Lovenox- don't anticipate need at discharge              -antiplatelet therapy: Baby aspirin daily Lower extremity pain pretibial swelling ,Doppler venous ultrasound bilateral lower limbs nl  3. Pain Management: OxyContin 10 mg every 12 hours with oxycodone 5 mg every 4 hours as needed Would d/c oxycontin at discharge , increase oxyIR 5mg  QID x 7d, TID x 7d, BID x 7d, daily x 7d- pt can see PCP if he needs additional pain meds              -Robaxin 500 every 6 as needed ordered             -Voltaren gel for topical relief             -Added K pad for muscle spasm and low back pain             -Tylenol for mild pain - give him #5 10mg  toradol to use sparingly at home. May use ibuprofen occasionally as well 4. Mood: Team to provide ego support             -continue Effexor XR 75 mg daily             -antipsychotic agents: N/A,  had been haldol before 5. Neuropsych: This patient is capable of making decisions on his own behalf. -Patient's mental status appears better  6. Skin/Wound Care: contact dermatitis, will start hydrocortisone cream , no change in distribution  7. Fluids/Electrolytes/Severe Protein malutrition: Patient now is on regular diet.  Encourage p.o. intake. 8.  Sepsis secondary to discitis and epidural abscess/bacteremia             -continue IV vancomycin through 9/15- f/u with ID prior to 9/15             -  Monitor clinically and with regular labs 9.  Uncontrolled type 2 diabetes             -Lantus insulin 5 units with sliding scale insulin coverage--increased to 10 units 8/6, increased to 15 on 8/12  CBG (last 3)  Recent Labs    02/16/21 1653 02/16/21 2113 02/17/21 0623  GLUCAP 108* 118* 112*  , increased lantus to 20U - improved 8/25 10.  Chronic diastolic congestive heart failure             -Monitor daily weights             -Anasarca most likely due to nutritional status as opposed to cardiac             -Recent 2D echo demonstrated no wall abnormalities   Filed Weights   01/31/21 0400 02/02/21 0630 02/03/21 0529  Weight: 98.3 kg 102.9 kg 102.7 kg   Stable on 8/11 11.  Hyponatremia- off Free H20  I/O looks ok  Sodium 124 today--will ask nephrology for opinion, ID perhaps as well  -other than abx, I see nothing as an obvious contributor. Perhaps losartan?             -Hypokalemia, improved , off lasix   8/25 up to 3.5 today, will not send home on potassium, rec K+ in diet 12. Anemia d/t illness  Hemoglobin stable, check iron studies and stool guaic  given poor recovery of hgb Baseline 11.6 g 13. Thrombocytosis:  Platelets 526 on 8/4, improving  476K on 8/15, recheck in am  14.  Blood pressure   Vitals:   02/16/21 1933 02/17/21 0520  BP: 118/82 124/67  Pulse: 87 85  Resp: 18 17  Temp: 99 F (37.2 C) 98.8 F (37.1 C)  SpO2: 99% 98%  8/25 controlled  on Losartan 12.5mg  ,  Toprol XL 25mg  , Norvasc 10mg     -hold losartan given #11. 15.  Myalgia - on statin will hold , nl CK, as discussed with patient this may take weeks to improve 16 peripheral edema LE associated with low alb, ? Improving    LOS: 20 days A FACE TO FACE EVALUATION WAS PERFORMED  02/17/2021, 9:22 AM

## 2021-02-17 NOTE — Consult Note (Signed)
[facility] Renal Consultation Note  Requesting MD: Alger Simons, MD Indication for Consultation:  hyponatremia  Chief complaint: functional deficits after multiple hospitalizations  HPI:  Louis [name] is a 55 y.o. male with a history of DM, HTN, CAD s/p CABG 2017, and chronic pain who presented to [facility] with back pain.  He had lumbar laminectomy in June for discitis and psoas abscess.  He has been on antibiotics.  He was admitted to rehab after functional deficits noted during his inpatient stay.  His course has recently been complicated by hyponatremia and nephrology is consulted for assistance with management of the same.  He has been on venlafaxine.  Toradol and trazodone were also PRN's.  He has been on lasix 20 mg PO daily from 8/22 and this was stopped on 8/24 after that day's dose.  He has received multiple doses of the toradol. Sodium has now trended down to 124.  BUN < 5.  Lasix 20 mg daily and effexor both listed as home meds in 01/06/21 H&P as well - he states he has been on effexor for years.  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  He states anxiety/depression has been under good control recently.  He states he has been told by his PCP that he should "eat more salt" so suspect hyponatremia as an outpatient as well.  He didn't have swelling at home but has has foot and lower leg swelling this hospitalization.  No family hx of adrenal or thyroid issues that he is aware of.  Na 142 this AM and last checked at 125 on 8/23.  Note Na on 12/12/20 was 128.  He states he was supposed to go home today and I discussed would not advise this.   PMHx:   Past Medical History:  Diagnosis Date   CAD (coronary artery disease)    DM (diabetes mellitus) ([place])    Hyperlipidemia    Hypertension     Past Surgical History:  Procedure Laterality Date    LUMBAR LAMINECTOMY FOR EPIDURAL ABSCESS N/A 12/21/2020   Procedure: Thoracic seven-eight, Thoracic twelve-Lumbar one, Lumbar three-four laminectomy for Epidural Abscess;  Surgeon: Dawley, Theodoro Doing, DO;  Location: [facility];  Service: Neurosurgery;  Laterality: N/A;   TEE WITHOUT CARDIOVERSION N/A 01/11/2021   Procedure: TRANSESOPHAGEAL ECHOCARDIOGRAM (TEE);  Surgeon: Lelon Perla, MD;  Location: [facility] ENDOSCOPY;  Service: Cardiovascular;  Laterality: N/A;    Family Hx: No family hx of adrenal or thyroid issues that he is aware of.  Social History:  reports that he has been smoking cigarettes. He started smoking about 25 years ago. He has been smoking an average of 2 packs per day. He has never used smokeless tobacco. He reports that he does not currently use alcohol. He reports that he does not use drugs.  Allergies: No Known Allergies  Medications: (medications from acute hospitalization?) Prior to Admission medications   Medication Sig Start Date End Date Taking? Authorizing Provider  acetaminophen (TYLENOL) 325 MG tablet Take 2 tablets (650 mg total) by mouth every 6 (six) hours as needed for mild pain (or Fever >/= 101). 12/25/20   Little Ishikawa, MD  acetaminophen (TYLENOL) 325 MG tablet Take 1-2 tablets (325-650 mg total) by mouth every 4 (four) hours as needed for mild pain. 02/17/21   Love, Ivan Anchors, PA-C  ALPRAZolam Duanne Moron) 0.25 MG  tablet Take 0.25 mg by mouth 2 (two) times daily as needed for anxiety.    [provider]  aspirin EC 81 MG tablet Take 81 mg by mouth daily. Swallow whole.    [provider]  atorvastatin (LIPITOR) 40 MG tablet Take 1 tablet (40 mg total) by mouth at bedtime. 12/25/20   Little Ishikawa, MD  dapagliflozin propanediol (FARXIGA) 10 MG TABS tablet Take 10 mg by mouth every morning.    [provider]  diclofenac Sodium (VOLTAREN) 1 % GEL Apply 1 application topically 4 (four) times daily as needed (pain).    [provider]   furosemide (LASIX) 10 MG/ML injection Inject 2 mLs (20 mg total) into the vein daily. 01/29/21   Nita Sells, MD  glipiZIDE (GLUCOTROL) 5 MG tablet Take 5 mg by mouth 2 (two) times daily.    [provider]  lidocaine (LIDODERM) 5 % Place 1 patch onto the skin daily as needed (pain). Remove & Discard patch within 12 hours or as directed by MD    [provider]  losartan (COZAAR) 25 MG tablet Take 25 mg by mouth every morning.    [provider]  metFORMIN (GLUCOPHAGE) 500 MG tablet Take 500-1,000 mg by mouth See admin instructions. Take 2 tablets (1000 mg) by mouth every morning and 1 tablet (500 mg) at night    [provider]  metoprolol succinate (TOPROL-XL) 25 MG 24 hr tablet Take 1 tablet (25 mg total) by mouth daily. 01/29/21   Nita Sells, MD  Nutritional Supplements (,FEEDING SUPPLEMENT, PROSOURCE PLUS) liquid Take 30 mLs by mouth 3 (three) times daily between meals. 01/28/21   Nita Sells, MD  Nutritional Supplements (FEEDING SUPPLEMENT, GLUCERNA 1.5 CAL,) LIQD Place 1,000 mLs into feeding tube daily. 01/28/21   Nita Sells, MD  Nutritional Supplements (FEEDING SUPPLEMENT, PROSOURCE TF,) liquid Place 45 mLs into feeding tube 2 (two) times daily. 01/28/21   Nita Sells, MD  omeprazole (PRILOSEC) 20 MG capsule Take 20 mg by mouth every morning.    [provider]  oxyCODONE (OXYCONTIN) 20 mg 12 hr tablet Take 1 tablet (20 mg total) by mouth every 12 (twelve) hours. 12/25/20   Little Ishikawa, MD  oxyCODONE (ROXICODONE) 15 MG immediate release tablet Take 1 tablet (15 mg total) by mouth every 3 (three) hours as needed for severe pain ((score 7 to 10)). 12/25/20   Little Ishikawa, MD  psyllium (HYDROCIL/METAMUCIL) 95 % PACK Take 1 packet by mouth daily. 02/18/21   Love, Ivan Anchors, PA-C  vancomycin HCl (VANCOREADY) 1500 MG/300ML SOLN Inject 300 mLs (1,500 mg total) into the vein every 12 (twelve) hours. 02/17/21    Love, Ivan Anchors, PA-C  vancomycin IVPB Inject 1,500 mg into the vein every 12 (twelve) hours. Indication:  bacteremia First Dose: Yes Last Day of Therapy:  03/10/21 Labs - Sunday/Monday:  CBC/D, BMP, and vancomycin trough. Labs - Thursday:  BMP and vancomycin trough Labs - Every other week:  ESR and CRP Method of administration:Elastomeric Method of administration may be changed at the discretion of the patient and/or caregiver's ability to self-administer the medication ordered. 02/15/21 03/30/21  Bary Leriche, PA-C  venlafaxine XR (EFFEXOR-XR) 75 MG 24 hr capsule Take 75 mg by mouth every morning.    [provider]  Water For Irrigation, Sterile (FREE WATER) SOLN Place 200 mLs into feeding tube every 4 (four) hours. 01/28/21   Nita Sells, MD    I have reviewed the patient's current  and reported prior to admission medications.  Labs:  BMP Latest Ref Rng & Units 02/17/2021 02/15/2021 02/13/2021  Glucose 70 - 99 mg/dL 113(H) 100(H) 127(H)  BUN 6 - 20 mg/dL <5(L) <5(L) <5(L)  Creatinine 0.61 - 1.24 mg/dL 0.35(L) 0.32(L) 0.46(L)  Sodium 135 - 145 mmol/L 124(L) 125(L) 127(L)  Potassium 3.5 - 5.1 mmol/L 3.5 3.3(L) 3.7  Chloride 98 - 111 mmol/L 91(L) 94(L) 95(L)  CO2 22 - 32 mmol/L $RemoveB'23 25 24  '[id number]$ Calcium 8.9 - 10.3 mg/dL 8.3(L) 8.2(L) 8.2(L)    Urinalysis    Component Value Date/Time   COLORURINE YELLOW 12/16/2020 [place] 12/16/2020 1615   LABSPEC 1.018 12/16/2020 1615   PHURINE 6.0 12/16/2020 1615   GLUCOSEU >=500 (A) 12/16/2020 1615   HGBUR LARGE (A) 12/16/2020 1615   BILIRUBINUR NEGATIVE 12/16/2020 1615   KETONESUR 20 (A) 12/16/2020 1615   PROTEINUR 30 (A) 12/16/2020 1615   NITRITE NEGATIVE 12/16/2020 1615   LEUKOCYTESUR NEGATIVE 12/16/2020 1615     ROS:  Pertinent items noted in HPI and remainder of comprehensive ROS otherwise negative.  Physical Exam: Vitals:   02/17/21 0520 02/17/21 0921  BP: 124/67 126/68  Pulse: 85 86  Resp: 17   Temp:  98.8 F (37.1 C)   SpO2: 98%      General: adult male in bed in NAD at rest HEENT: NCAT  Eyes: EOMI sclera anicteric Neck: Supple trachea midline Heart: S1s2 no rub Lungs: clear to auscultation; unlabored on room air Abdomen: soft/NT/ND Extremities: 1+ edema; no cyanosis or clubbing  Skin: no rash on extremities exposed Neuro: alert and oriented x 3; provides hx and follows commands  Assessment/Plan:  # Hyponatremia  - Secondary in part to SSRI; note also has been on lasix.  Note last albumin on 8/4 was 2.2 - Stopped effexor - would not resume this or any SSRI - Check serum and urine osm and urine sodium  - Defer lasix today  - BMP in AM - ordered daily x 5 days to end Tues 8/30 - Check TSH and AM cortisol   # HTN  - Controlled; note on amlodipine and wonder if contributing to swelling   # Anemia - iron deficiency  - would start oral iron daily  # Discitis and epidural abscess  - on vanc IV through 9/15 per charting  # Deconditioning/functional deficits - Rehab per inpatient rehab team  Disposition - would continue inpatient monitoring and reassess discharge timing tomorrow vs over the weekend per lab trends  Claudia Desanctis 02/17/2021, 2:43 PM

## 2021-02-17 NOTE — Progress Notes (Signed)
Patient ID: Louis [name], male   DOB: 1965-07-25, 55 y.o.   MRN: [id number]  Per medical team, pt will not d/c today due to concerns related to his sodium levels.   SW updated Cory[facility] and Pam[facility]. Informed that there will be updates when aware of his d/c.   Cecile Sheerer, MSW, LCSWA Office: [phone] Cell: [phone] Fax: [phone]

## 2021-02-17 NOTE — Progress Notes (Signed)
Patient slept fairly well. Medicated x1 with prn tramadol for back pain-partially effective. No other issues noted throughout the shift. Stool negative for occult blood.

## 2021-02-17 NOTE — Progress Notes (Addendum)
Inpatient Rehabilitation Care Coordinator Discharge Note   Patient Details  Name: Louis [name] MRN: [id number] Date of Birth: May 17, 1966   Discharge location: D/c to home with his wife who will provide 24/7 care  Length of Stay: 19 days.  Discharge activity level: Supervision  Home/community participation: Limited  Patient response JP:ETKKOE Literacy - How often do you need to have someone help you when you read instructions, pamphlets, or other written material from your doctor or pharmacy?: Never  Patient response CX:FQHKUV Isolation - How often do you feel lonely or isolated from those around you?: Never  Services provided included: MD, RD, PT, OT, RN, CM, TR, Pharmacy, Neuropsych, SW  Financial Services:  Field seismologist Utilized: Barrister's clerk  Choices offered to/list presented to: yes  Follow-up services arranged:  Home Health, DME Home Health Agency: [facility] for HHPT/OT/SN    DME : wheelchair (pt to purchase)    Patient response to transportation need: Is the patient able to respond to transportation needs?: Yes In the past 12 months, has lack of transportation kept you from medical appointments or from getting medications?: No In the past 12 months, has lack of transportation kept you from meetings, work, or from getting things needed for daily living?: No  Comments (or additional information):  Patient/Family verbalized understanding of follow-up arrangements:  Yes  Individual responsible for coordination of the follow-up plan: contact pt wife Karen#[phone]  Confirmed correct DME delivered: Gretchen Short 02/17/2021    Gretchen Short

## 2021-02-18 DIAGNOSIS — R5381 Other malaise: Secondary | ICD-10-CM | POA: Diagnosis not present

## 2021-02-18 DIAGNOSIS — R7881 Bacteremia: Secondary | ICD-10-CM | POA: Diagnosis not present

## 2021-02-18 DIAGNOSIS — R0989 Other specified symptoms and signs involving the circulatory and respiratory systems: Secondary | ICD-10-CM | POA: Diagnosis not present

## 2021-02-18 DIAGNOSIS — E871 Hypo-osmolality and hyponatremia: Secondary | ICD-10-CM | POA: Diagnosis not present

## 2021-02-18 LAB — CORTISOL-AM, BLOOD: Cortisol - AM: 9 ug/dL (ref 6.7–22.6)

## 2021-02-18 LAB — GLUCOSE, CAPILLARY
Glucose-Capillary: 93 mg/dL (ref 70–99)
Glucose-Capillary: 205 mg/dL — ABNORMAL HIGH (ref 70–99)
Glucose-Capillary: 172 mg/dL — ABNORMAL HIGH (ref 70–99)
Glucose-Capillary: 134 mg/dL — ABNORMAL HIGH (ref 70–99)

## 2021-02-18 LAB — BASIC METABOLIC PANEL
Anion gap: 11 (ref 5–15)
BUN: <5 mg/dL — ABNORMAL LOW (ref 6–20)
CO2: 22 mmol/L (ref 22–32)
Calcium: 8.5 mg/dL — ABNORMAL LOW (ref 8.9–10.3)
Chloride: 92 mmol/L — ABNORMAL LOW (ref 98–111)
Creatinine, Ser: 0.45 mg/dL — ABNORMAL LOW (ref 0.61–1.24)
GFR, Estimated: >60 mL/min (ref >60)
Glucose, Bld: 83 mg/dL (ref 70–99)
Potassium: 3.5 mmol/L (ref 3.5–5.1)
Sodium: 125 mmol/L — ABNORMAL LOW (ref 135–145)

## 2021-02-18 LAB — SODIUM, URINE, RANDOM: Sodium, Ur: 109 mmol/L

## 2021-02-18 LAB — OSMOLALITY, URINE: Osmolality, Ur: 421 mOsm/kg (ref 300–900)

## 2021-02-18 MED ORDER — POLYSACCHARIDE IRON COMPLEX 150 MG PO CAPS: 150.0000 mg | ORAL_CAPSULE | Freq: Every day | ORAL | 0 refills | Status: DC

## 2021-02-18 MED ORDER — AMLODIPINE BESYLATE 10 MG PO TABS: 10.0000 mg | ORAL_TABLET | Freq: Every day | ORAL | 0 refills | Status: AC

## 2021-02-18 MED ORDER — FOLIC ACID 1 MG PO TABS: 1.0000 mg | ORAL_TABLET | Freq: Every day | ORAL | 0 refills | Status: AC

## 2021-02-18 MED ORDER — OXYCODONE HCL 5 MG PO TABS: 5.0000 mg | ORAL_TABLET | Freq: Two times a day (BID) | ORAL | 0 refills | Status: DC | PRN

## 2021-02-18 MED ORDER — BLOOD GLUCOSE MONITOR KIT: PACK | 0 refills | Status: DC

## 2021-02-18 MED ORDER — METOPROLOL SUCCINATE ER 25 MG PO TB24: 25.0000 mg | ORAL_TABLET | Freq: Every day | ORAL | 0 refills | Status: DC

## 2021-02-18 MED ORDER — MELATONIN 3 MG PO TABS: 3.0000 mg | ORAL_TABLET | Freq: Every day | ORAL | 0 refills | Status: DC

## 2021-02-18 MED ORDER — GABAPENTIN 100 MG PO CAPS: 100.0000 mg | ORAL_CAPSULE | Freq: Two times a day (BID) | ORAL | 0 refills | Status: DC

## 2021-02-18 MED ORDER — ONDANSETRON HCL 4 MG PO TABS: 4.0000 mg | ORAL_TABLET | Freq: Four times a day (QID) | ORAL | 0 refills | Status: DC | PRN

## 2021-02-18 MED ORDER — TRAMADOL HCL 50 MG PO TABS: 50.0000 mg | ORAL_TABLET | Freq: Four times a day (QID) | ORAL | 0 refills | Status: DC | PRN

## 2021-02-18 MED ORDER — HYDROXYZINE HCL 10 MG PO TABS: 10.0000 mg | ORAL_TABLET | Freq: Three times a day (TID) | ORAL | 0 refills | Status: DC | PRN

## 2021-02-18 MED ORDER — METHOCARBAMOL 500 MG PO TABS: 500.0000 mg | ORAL_TABLET | Freq: Four times a day (QID) | ORAL | 0 refills | Status: DC | PRN

## 2021-02-18 MED ORDER — ADULT MULTIVITAMIN W/MINERALS CH: 1.0000 | ORAL_TABLET | Freq: Every day | ORAL | Status: DC

## 2021-02-18 MED ORDER — METHOCARBAMOL 500 MG PO TABS: 500.0000 mg | ORAL_TABLET | Freq: Four times a day (QID) | ORAL | Status: DC

## 2021-02-18 MED ORDER — LOSARTAN POTASSIUM 25 MG PO TABS: 12.5000 mg | ORAL_TABLET | Freq: Every morning | ORAL | 0 refills | Status: AC

## 2021-02-18 MED ORDER — JUVEN PO PACK: 1.0000 | PACK | Freq: Three times a day (TID) | ORAL | 0 refills | Status: DC

## 2021-02-18 MED ORDER — PEN NEEDLES 32G X 6 MM MISC: 1.0000 "application " | Freq: Two times a day (BID) | 1 refills | Status: DC

## 2021-02-18 MED ORDER — METHOCARBAMOL 500 MG PO TABS
500.0000 mg | ORAL_TABLET | Freq: Four times a day (QID) | ORAL | Status: DC
  Administered 2021-02-18 – 2021-02-19 (×3): 500 mg via ORAL
  Filled 2021-02-18 (×3): qty 1

## 2021-02-18 MED ORDER — INSULIN ASPART 100 UNIT/ML FLEXPEN: 4.0000 [IU] | PEN_INJECTOR | Freq: Three times a day (TID) | SUBCUTANEOUS | 1 refills | Status: DC

## 2021-02-18 MED ORDER — BASAGLAR KWIKPEN 100 UNIT/ML ~~LOC~~ SOPN: 15.0000 [IU] | PEN_INJECTOR | Freq: Every day | SUBCUTANEOUS | 1 refills | Status: DC

## 2021-02-18 MED ORDER — PANTOPRAZOLE SODIUM 40 MG PO TBEC: 40.0000 mg | DELAYED_RELEASE_TABLET | Freq: Two times a day (BID) | ORAL | 0 refills | Status: DC

## 2021-02-18 MED ORDER — HYDROXYZINE HCL 10 MG PO TABS
10.0000 mg | ORAL_TABLET | Freq: Three times a day (TID) | ORAL | Status: DC | PRN
  Administered 2021-02-18 – 2021-02-19 (×2): 10 mg via ORAL
  Filled 2021-02-18 (×4): qty 1

## 2021-02-18 MED ORDER — INSULIN LISPRO (1 UNIT DIAL) 100 UNIT/ML (KWIKPEN): 4.0000 [IU] | PEN_INJECTOR | Freq: Three times a day (TID) | SUBCUTANEOUS | 0 refills | Status: DC

## 2021-02-18 MED ORDER — DICLOFENAC SODIUM 1 % EX GEL: 2.0000 g | Freq: Four times a day (QID) | CUTANEOUS | 0 refills | Status: DC | PRN

## 2021-02-18 MED ORDER — HYDROCORTISONE 0.5 % EX CREA: TOPICAL_CREAM | Freq: Two times a day (BID) | CUTANEOUS | 0 refills | Status: DC

## 2021-02-18 NOTE — Discharge Summary (Signed)
Physician Discharge Summary  Patient ID: Louis [name] MRN: [id number] DOB/AGE: 57/04/1966 57 y.o.  Admit date: 01/28/2021 Discharge date: 02/20/2021  Discharge Diagnoses:  Principal Problem:   Debility Active Problems:   Epidural abscess   Pressure injury of skin   Chronic diastolic congestive heart failure (HCC)   Uncontrolled type 2 diabetes mellitus with hyperglycemia (HCC)   Acute midline low back pain without sciatica   SIADH (syndrome of inappropriate ADH production) ([place])   Discharged Condition: good  Significant Diagnostic Studies: VAS [place] LOWER EXTREMITY VENOUS (DVT)  Result Date: 02/13/2021  Lower Venous DVT Study Patient Name:  BLAIR LUNDEEN  Date of Exam:   02/13/2021 Medical Rec #: [id number]  Accession #:    [id number] Date of Birth: 1965/12/16   Patient Gender: M Patient Age:   55 years Exam Location:  [facility] Procedure:      VAS [place] LOWER EXTREMITY VENOUS (DVT) Referring Phys: Alysia Penna --------------------------------------------------------------------------------  Indications: Pain, and Swelling.  Comparison Study: Previous exam 01/22/16 $RemoveBefo'@[facility]'iLNTcWNFbkm$  RLE - Negative Performing Technologist: Jody Hill RVT, RDMS  Examination Guidelines: A complete evaluation includes B-mode imaging, spectral Doppler, color Doppler, and power Doppler as needed of all accessible portions of each vessel. Bilateral testing is considered an integral part of a complete examination. Limited examinations for reoccurring indications may be performed as noted. The reflux portion of the exam is performed with the patient in reverse Trendelenburg.  +---------+---------------+---------+-----------+----------+--------------+ RIGHT    CompressibilityPhasicitySpontaneityPropertiesThrombus Aging +---------+---------------+---------+-----------+----------+--------------+ CFV      Full           Yes      Yes                                  +---------+---------------+---------+-----------+----------+--------------+ SFJ      Full                                                        +---------+---------------+---------+-----------+----------+--------------+ FV Prox  Full           Yes      Yes                                 +---------+---------------+---------+-----------+----------+--------------+ FV Mid   Full           Yes      Yes                                 +---------+---------------+---------+-----------+----------+--------------+ FV DistalFull           Yes      Yes                                 +---------+---------------+---------+-----------+----------+--------------+ PFV      Full                                                        +---------+---------------+---------+-----------+----------+--------------+ POP  Full           Yes      Yes                                 +---------+---------------+---------+-----------+----------+--------------+ PTV      Full                                                        +---------+---------------+---------+-----------+----------+--------------+ PERO     Full                                                        +---------+---------------+---------+-----------+----------+--------------+   +---------+---------------+---------+-----------+----------+--------------+ LEFT     CompressibilityPhasicitySpontaneityPropertiesThrombus Aging +---------+---------------+---------+-----------+----------+--------------+ CFV      Full           Yes      Yes                                 +---------+---------------+---------+-----------+----------+--------------+ SFJ      Full                                                        +---------+---------------+---------+-----------+----------+--------------+ FV Prox  Full           Yes      Yes                                  +---------+---------------+---------+-----------+----------+--------------+ FV Mid   Full           Yes      Yes                                 +---------+---------------+---------+-----------+----------+--------------+ FV DistalFull           Yes      Yes                                 +---------+---------------+---------+-----------+----------+--------------+ PFV      Full                                                        +---------+---------------+---------+-----------+----------+--------------+ POP      Full           Yes      Yes                                 +---------+---------------+---------+-----------+----------+--------------+ PTV      Full                                                        +---------+---------------+---------+-----------+----------+--------------+  PERO     Full                                                        +---------+---------------+---------+-----------+----------+--------------+     Summary: BILATERAL: - No evidence of deep vein thrombosis seen in the lower extremities, bilaterally. - No evidence of superficial venous thrombosis in the lower extremities, bilaterally. -No evidence of popliteal cyst, bilaterally. RIGHT: - Ultrasound characteristics of enlarged lymph nodes are noted in the groin. Subcutaneous edema throughout entire leg.  LEFT: - Ultrasound characteristics of enlarged lymph nodes noted in the groin. Subcutaneous edema throughout entire leg.  *See table(s) above for measurements and observations. Electronically signed by Deitra Mayo MD on 02/13/2021 at 7:19:49 PM.    Final    [place] EKG SITE RITE  Result Date: 02/04/2021 If Site Rite image not attached, placement could not be confirmed due to current cardiac rhythm.   Labs:  Basic Metabolic Panel: Recent Labs  Lab 02/13/21 1050 02/15/21 0409 02/17/21 0745 02/18/21 0410  NA 127* 125* 124* 125*  K 3.7 3.3* 3.5 3.5  CL 95* 94* 91* 92*  CO2 $Re'24  25 23 22  'gbX$ GLUCOSE 127* 100* 113* 83  BUN <5* <5* <5* <5*  CREATININE 0.46* 0.32* 0.35* 0.45*  CALCIUM 8.2* 8.2* 8.3* 8.5*    CBC: CBC Latest Ref Rng & Units 02/14/2021 02/07/2021 01/27/2021  WBC 4.0 - 10.5 K/uL 5.2 7.0 8.0  Hemoglobin 13.0 - 17.0 g/dL 7.4(L) 7.8(L) 7.6(L)  Hematocrit 39.0 - 52.0 % 22.9(L) 24.7(L) 23.9(L)  Platelets 150 - 400 K/uL 440(H) 472(H) 526(H)     CBG: Recent Labs  Lab 02/17/21 0623 02/17/21 1125 02/17/21 1643 02/17/21 2105 02/18/21 0606  GLUCAP 112* 164* 153* 114* 93    Brief HPI:   Jj Enyeart is a 55 y.o. male with history of T2DM, HTN, CAD, chronic pain was admitted on 01/05/2021 with 5-day history of increasing back pain radiating to bilateral lower extremities.  Patient also notable for lumbar lab in late June for L3-S1 discitis with associated psoas abscess with original plans for daptomycin to continue through August 10 for MRSA coverage.  Work-up at admission revealed multiple fluid collection at L4-L5 and neurosurgery was consulted for input.  Surgical intervention not needed and interventional radiology was consulted to drain 5 cc of fluid at L3 level.  TEE done for work-up endocarditis.  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  Patient was noted to be limited by weakness and pain with therapy ongoing.  CIR was recommended due to functional decline   Hospital Course: Tymel Conely was admitted to rehab 01/28/2021 for inpatient therapies to consist of PT, and OT at least three hours five days a week. Past admission physiatrist, therapy team and rehab RN have worked together to provide customized collaborative inpatient rehab.  He tolerated antibiotics without any side effects with serial CBC showing anemia of chronic disease is stable without increase in white count.   His blood pressures were monitored on TID basis and were noted to be  relatively stable.  His diabetes has been monitored with ac/hs CBG checks and SSI was use prn for tighter BS control.  His p.o. intake has been good and his  diabetic regimen has been titrated to 15 units daily of insulin gglargine with 4 units novolog meal coverage TID.   BLE Dopplers done on 08/21 were negative for DVT and muscle spasms in bilateral lower extremity were managed with Robaxin prn.  Narcotics have been titrated to help with activity tolerance at this was tapered to oxycodone 5 mg twice daily as needed as alternating with tramadol 4 times daily as needed.  Chronic diastolic failure has been monitored with serial weights and anasarca felt to be due to poor nutritional status is resolving.  Protein supplements were added to help with low calorie malnutrition.  Hospital course was significant for acute on chronic hyponatremia with progressive drop to 124 despite discontinuation of Lasix.  Dr. Shelda Altes was consulted for input and recommended discontinuation of Effexor and not resuming this or any SSRI in the future.  He did report some withdrawal from discontinuation of Effexor therefore was started on Vistaril 3 times daily as needed to help manage symptoms.  He was started on 1.2 L fluid restriction and work-up done showing urine osmolality of 421 and urine sodium of 109 was most consistent with SIADH.  His length of stay was extended by a few days for daily monitoring of sodium levels for recovery and stability.  He did have further drop in sodium on 08/27 and therefore started on urea 15 g BID while inpatient y as well as Lasix 20 mg twice daily.    He was educated to increase high-protein foods and supplements at discharge.  Repeat labs of 08/28 showed improvement in sodium to 125 as well as hypokalemia was treated briefly with supplement and he was instructed on high K+ diet. He was cleared for discharge to home on 08/28.  He has made gains during his rehab stay however progress has  been limited by pain, nausea and dizziness.  Supervision is recommended for safety after discharge.  He will continue to receive follow-up home health PT, OT and RN by [facility] health      Rehab course: During patient's stay in rehab weekly team conferences were held to monitor patient's progress, set goals and discuss barriers to discharge. At admission, patient required mod assist with mobility and max assist with ADL tasks.  He has had improvement in activity tolerance, balance, postural control as well as ability to compensate for deficits.  He requires max assist for lower body ADLs and supervision for upper body care. He requires supervision for transfers and to ambulate 100 feet with rolling walker.  Family education has been completed with wife.  Disposition: Home  Diet: Carb Modified. 1200 cc FR/day  Special Instructions: Monitor BS ac/hs and follow up with PCP for further adjustment.  2.  Vancomycin 1500 mg IV every 12 hours to continue through 03/10/2021. 3. Avoid SSRI/SNRI due to SE of hyponatremia.   Discharge Instructions     [facility] pharmacist to adjust dose for Vancomycin, Aminoglycosides and other anti-infective therapies as requested by physician.   Complete by: As directed    [facility] to provide Cath Flo $Remove'2mg'TvHulAa$    Complete by: As directed    Administer for PICC line occlusion and as ordered by physician for other access device issues.   Anaphylaxis Kit: Provided to treat any anaphylactic reaction to the medication being provided to the patient if First Dose or when requested by physician   Complete by: As directed    Epinephrine $RemoveBefo'1mg'WTAWtwStyal$ /ml vial / amp: Administer 0.$RemoveBeforeDEI'3mg'dCkxBhWXrYmFDmxd$  (0.76ml) subcutaneously once for  moderate to severe anaphylaxis, nurse to call physician and pharmacy when reaction occurs and call 911 if needed for immediate care   Diphenhydramine 50mg /ml IV vial: Administer 25-50mg  IV/IM PRN for first dose reaction, rash, itching, mild reaction,  nurse to call physician and pharmacy when reaction occurs   Sodium Chloride 0.9% NS 552ml IV: Administer if needed for hypovolemic blood pressure drop or as ordered by physician after call to physician with anaphylactic reaction   Change dressing on IV access line weekly and PRN   Complete by: As directed    Flush IV access with Sodium Chloride 0.9% and Heparin 10 units/ml or 100 units/ml   Complete by: As directed    Home infusion instructions - [facility]   Complete by: As directed    Instructions: Flush IV access with Sodium Chloride 0.9% and Heparin 10units/ml or 100units/ml   Change dressing on IV access line: Weekly and PRN   Instructions Cath Flo 2mg : Administer for PICC Line occlusion and as ordered by physician for other access device   [facility] pharmacist to adjust dose for: Vancomycin, Aminoglycosides and other anti-infective therapies as requested by physician   Method of administration may be changed at the discretion of home infusion pharmacist based upon assessment of the patient and/or caregiver's ability to self-administer the medication ordered   Complete by: As directed    Outpatient Parenteral Antibiotic Therapy Information Antibiotic: Vancomycin IVPB; Indications for use: diskitis/psoas abscess; End Date: 03/10/2021   Complete by: As directed    Antibiotic: Vancomycin IVPB   Indications for use: diskitis/psoas abscess   End Date: 03/10/2021      Allergies as of 02/20/2021   No Known Allergies      Medication List     STOP taking these medications    (feeding supplement) PROSource Plus liquid   ALPRAZolam 0.25 MG tablet Commonly known as: XANAX   atorvastatin 40 MG tablet Commonly known as: LIPITOR   dapagliflozin propanediol 10 MG Tabs tablet Commonly known as: FARXIGA   feeding supplement (GLUCERNA 1.5 CAL) Liqd Replaced by: nutrition supplement (JUVEN) Pack   feeding supplement (PROSource TF) liquid   free water Soln    furosemide 10 MG/ML injection Commonly known as: LASIX Replaced by: furosemide 20 MG tablet   glipiZIDE 5 MG tablet Commonly known as: GLUCOTROL   lidocaine 5 % Commonly known as: LIDODERM   metFORMIN 500 MG tablet Commonly known as: GLUCOPHAGE   omeprazole 20 MG capsule Commonly known as: PRILOSEC   venlafaxine XR 75 MG 24 hr capsule Commonly known as: EFFEXOR-XR       TAKE these medications    acetaminophen 325 MG tablet Commonly known as: TYLENOL Take 1-2 tablets (325-650 mg total) by mouth every 4 (four) hours as needed for mild pain. What changed:  how much to take when to take this reasons to take this   amLODipine 10 MG tablet Commonly known as: NORVASC Take 1 tablet (10 mg total) by mouth daily.   aspirin EC 81 MG tablet Take 81 mg by mouth daily. Swallow whole.   Basaglar KwikPen 100 UNIT/ML Inject 15 Units into the skin daily.   blood glucose meter kit and supplies Kit Dispense based on patient and insurance preference. Use up to four times daily as directed.   diclofenac Sodium 1 % Gel Commonly known as: VOLTAREN Apply 2 g topically 4 (four) times daily as needed (pain). What changed: how much to take   folic acid 1 MG tablet  Commonly known as: FOLVITE Take 1 tablet (1 mg total) by mouth daily.   furosemide 20 MG tablet Commonly known as: LASIX Take 1 tablet (20 mg total) by mouth daily. Replaces: furosemide 10 MG/ML injection   gabapentin 100 MG capsule Commonly known as: NEURONTIN Take 1 capsule (100 mg total) by mouth 2 (two) times daily.   gabapentin 300 MG capsule Commonly known as: NEURONTIN Take 1 capsule (300 mg total) by mouth 3 (three) times daily.   hydrocortisone cream 0.5 % Apply topically 2 (two) times daily. Notes to patient: OTC   hydrOXYzine 10 MG tablet Commonly known as: ATARAX/VISTARIL Take 1 tablet (10 mg total) by mouth 3 (three) times daily as needed for anxiety, nausea or vomiting.   insulin aspart 100  UNIT/ML FlexPen Commonly known as: NOVOLOG Inject 4 Units into the skin 3 (three) times daily with meals.   iron polysaccharides 150 MG capsule Commonly known as: NIFEREX Take 1 capsule (150 mg total) by mouth daily.   losartan 25 MG tablet Commonly known as: COZAAR Take 0.5 tablets (12.5 mg total) by mouth every morning. What changed: how much to take   melatonin 3 MG Tabs tablet Take 1 tablet (3 mg total) by mouth at bedtime.   methocarbamol 500 MG tablet Commonly known as: ROBAXIN Take 1 tablet (500 mg total) by mouth every 6 (six) hours as needed for muscle spasms.   methocarbamol 500 MG tablet Commonly known as: ROBAXIN Take 1 tablet (500 mg total) by mouth 4 (four) times daily.   metoprolol succinate 25 MG 24 hr tablet Commonly known as: TOPROL-XL Take 1 tablet (25 mg total) by mouth daily.   multivitamin with minerals Tabs tablet Take 1 tablet by mouth daily.   nutrition supplement (JUVEN) Pack Take 1 packet by mouth 3 (three) times daily between meals. Replaces: feeding supplement (GLUCERNA 1.5 CAL) Liqd   ondansetron 4 MG tablet Commonly known as: ZOFRAN Take 1 tablet (4 mg total) by mouth every 6 (six) hours as needed for nausea.   oxyCODONE 5 MG immediate release tablet--Rx# 10 pills Commonly known as: Oxy IR/ROXICODONE Take 1 tablet (5 mg total) by mouth every 12 (twelve) hours as needed for severe pain. What changed:  medication strength how much to take when to take this reasons to take this Another medication with the same name was removed. Continue taking this medication, and follow the directions you see here.   pantoprazole 40 MG tablet Commonly known as: PROTONIX Take 1 tablet (40 mg total) by mouth 2 (two) times daily.   Pen Needles 32G X 6 MM Misc 1 application by Does not apply route 2 (two) times daily.   psyllium 95 % Pack Commonly known as: HYDROCIL/METAMUCIL Take 1 packet by mouth daily.   traMADol 50 MG tablet Rx# 28  pills Commonly known as: ULTRAM Take 1 tablet (50 mg total) by mouth every 6 (six) hours as needed for moderate pain (use for pain rated 4-6/10).   vancomycin  IVPB Inject 1,500 mg into the vein every 12 (twelve) hours. Indication:  bacteremia First Dose: Yes Last Day of Therapy:  03/10/21 Labs - Sunday/Monday:  CBC/D, BMP, and vancomycin trough. Labs - Thursday:  BMP and vancomycin trough Labs - Every other week:  ESR and CRP Method of administration:Elastomeric Method of administration may be changed at the discretion of the patient and/or caregiver's ability to self-administer the medication ordered.               Discharge  Care Instructions  (From admission, onward)           Start     Ordered   02/15/21 0000  Change dressing on IV access line weekly and PRN  (Home infusion instructions - [facility] )        02/15/21 1133            Follow-up Information     Ray Church, NP. Call.   Specialty: Nurse Practitioner Why: for post hospital follow up Contact information: [place] [address] [phone]         Jabier Mutton, MD Follow up on 03/03/2021.   Specialty: Infectious Diseases Why: keep 2 pm follow up appointment Contact information: [place] [address] [phone]         Charlett Blake, MD Follow up.   Specialty: Physical Medicine and Rehabilitation Why: as needed Contact information: [place] [address] [phone]         Dawley, Theodoro Doing, DO. Call in 1 day(s).   Why: for follow up on back. Contact information: [address] [place] [address] [phone]         Rexene Agent, MD. Call.   Specialty: Nephrology Why: as needed for kidney/sodium issues Contact information: [address] [place] [address] [phone]                 Signed: Bary Leriche 02/23/2021, 7:10 PM

## 2021-02-18 NOTE — Progress Notes (Signed)
Admit: 01/28/2021 LOS: 21  20M AoC Hypotonic Euvolemic Hyponatremia, likely SIADH  Subjective:  Serum sodium increased to 125 Urine studies with urine osmolality of 421 and urine sodium 109, most consistent with SIADH Is BUN of less than 5 makes me wonder about a low solute process contributing as well Urine output of 2.6 L yesterday, at least Patient is asymptomatic, conversant, feels well Patient has had mild hyponatremia throughout hospital course  08/25 0701 - 08/26 0700 In: 896.3 [P.O.:240; I.V.:56.3; IV Piggyback:600] Out: 2600 [Urine:2600]  Filed Weights   02/02/21 0630 02/03/21 0529 02/17/21 1145  Weight: 102.9 kg 102.7 kg 95 kg    Scheduled Meds:  amLODipine  10 mg Oral Daily   aspirin EC  81 mg Oral Daily   Chlorhexidine Gluconate Cloth  6 each Topical Daily   diclofenac Sodium  2 g Topical QID   enoxaparin (LOVENOX) injection  40 mg Subcutaneous Q24H   folic acid  1 mg Oral Daily   gabapentin  100 mg Oral BID   hydrocortisone cream   Topical BID   insulin aspart  0-5 Units Subcutaneous QHS   insulin aspart  0-9 Units Subcutaneous TID WC   insulin aspart  4 Units Subcutaneous TID WC   insulin glargine-yfgn  15 Units Subcutaneous Daily   iron polysaccharides  150 mg Oral Daily   losartan  12.5 mg Oral Daily   mouth rinse  15 mL Mouth Rinse BID   melatonin  3 mg Oral QHS   metoprolol succinate  25 mg Oral Daily   multivitamin with minerals  1 tablet Oral Daily   nutrition supplement (JUVEN)  1 packet Oral TID BM   oxyCODONE  10 mg Oral Q0600   pantoprazole  40 mg Oral BID   Ensure Max Protein  11 oz Oral BID   psyllium  1 packet Oral Daily   sodium chloride flush  10-40 mL Intracatheter Q12H   Continuous Infusions:  sodium chloride Stopped (02/17/21 0026)   vancomycin 1,500 mg (02/18/21 0912)   PRN Meds:.sodium chloride, acetaminophen, alum & mag hydroxide-simeth, bisacodyl, diclofenac Sodium, diphenhydrAMINE, guaiFENesin-dextromethorphan, heparin lock flush,  methocarbamol, ondansetron **OR** ondansetron (ZOFRAN) IV, oxyCODONE, polyethylene glycol, prochlorperazine **OR** prochlorperazine **OR** prochlorperazine, sodium chloride flush, traMADol  Current Labs: reviewed    Physical Exam:  Blood pressure 140/85, pulse 87, temperature 97.9 F (36.6 C), temperature source Oral, resp. rate 18, height 5\' 11"  (1.803 m), weight 95 kg, SpO2 100 %. 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, asymptomatic, mild, slightly improved; partially driven by SSRI; likely needs to be taken more solute HTN on amlodipine Disckitis and epidural abscess, ongoing vanc  P If serum sodium is stable greater than or equal to 125 I think okay for discharge Reminded of importance to increase solute intake, largely by choosing proteins or nutritional supplements Continue fluid restriction, ordered at 1.2 L Stay off SSRI Discussed with primary service   MD 02/18/2021, 11:37 AM  Recent Labs  Lab 02/15/21 0409 02/17/21 0745 02/18/21 0410  NA 125* 124* 125*  K 3.3* 3.5 3.5  CL 94* 91* 92*  CO2 25 23 22   GLUCOSE 100* 113* 83  BUN <5* <5* <5*  CREATININE 0.32* 0.35* 0.45*  CALCIUM 8.2* 8.3* 8.5*   Recent Labs  Lab 02/14/21 0322  WBC 5.2  HGB 7.4*  HCT 22.9*  MCV 83.3  PLT 440*

## 2021-02-18 NOTE — Progress Notes (Addendum)
PROGRESS NOTE   Subjective/Complaints: Pt in bed. Although disappointed he couldn't go home yesterday he understands why.  ROS: Patient denies fever, rash, sore throat, blurred vision, nausea, vomiting, diarrhea, cough, shortness of breath or chest pain,   headache, or mood change.   Objective:   No results found. No results for input(s): WBC, HGB, HCT, PLT in the last 72 hours.  Recent Labs    02/17/21 0745 02/18/21 0410  NA 124* 125*  K 3.5 3.5  CL 91* 92*  CO2 23 22  GLUCOSE 113* 83  BUN <5* <5*  CREATININE 0.35* 0.45*  CALCIUM 8.3* 8.5*    Intake/Output Summary (Last 24 hours) at 02/18/2021 0808 Last data filed at 02/18/2021 0300 Gross per 24 hour  Intake 896.33 ml  Output 2600 ml  Net -1703.67 ml         Physical Exam: Vital Signs Blood pressure 140/85, pulse 87, temperature 97.9 F (36.6 C), temperature source Oral, resp. rate 18, height 5\' 11"  (1.803 m), weight 95 kg, SpO2 100 %.   Constitutional: No distress . Vital signs reviewed. HEENT: NCAT, EOMI, oral membranes moist Neck: supple Cardiovascular: RRR without murmur. No JVD    Respiratory/Chest: CTA Bilaterally without wheezes or rales. Normal effort    GI/Abdomen: BS +, non-tender, non-distended Ext: no clubbing, cyanosis, BLE edema Psych: pleasant and cooperative   Skin: No evidence of breakdown, 0.5-1cm macules mainly at base of both palms and Right distal lateral 5th digit, no discharge, non tender, skin appears dry. Examined sacrum and gluteal folds. Other than some redness I saw no signs of breakdown, blister MSK- tenderness to palpation Bilateral arms forearms , thighs and calves --is improving Mood and affect approp Neuro: Alert and oriented Motor: Grossly 4/5 throughout, stable  Assessment/Plan: 1. Functional deficits which require 3+ hours per day of interdisciplinary therapy in a comprehensive inpatient rehab setting. Physiatrist is  providing close team supervision and 24 hour management of active medical problems listed below. Physiatrist and rehab team continue to assess barriers to discharge/monitor patient progress toward functional and medical goals  Care Tool:  Bathing  Bathing activity did not occur: Refused Body parts bathed by patient: Right arm, Left arm, Chest, Abdomen, Right upper leg, Left upper leg, Face   Body parts bathed by helper: Front perineal area, Buttocks, Right lower leg, Left lower leg     Bathing assist Assist Level: Moderate Assistance - Patient 50 - 74%     Upper Body Dressing/Undressing Upper body dressing   What is the patient wearing?: Pull over shirt    Upper body assist Assist Level: Independent    Lower Body Dressing/Undressing Lower body dressing      What is the patient wearing?: Pants, Underwear/pull up     Lower body assist Assist for lower body dressing: Maximal Assistance - Patient 25 - 49%     Toileting Toileting    Toileting assist Assist for toileting: Maximal Assistance - Patient 25 - 49% Assistive Device Comment: bed pan   Transfers Chair/bed transfer  Transfers assist     Chair/bed transfer assist level: Supervision/Verbal cueing     Locomotion Ambulation   Ambulation assist  Assist level: Supervision/Verbal cueing Assistive device: Walker-rolling Max distance: 100   Walk 10 feet activity   Assist  Walk 10 feet activity did not occur: Safety/medical concerns  Assist level: Supervision/Verbal cueing Assistive device: Walker-rolling   Walk 50 feet activity   Assist Walk 50 feet with 2 turns activity did not occur: Safety/medical concerns  Assist level: Supervision/Verbal cueing Assistive device: Walker-rolling    Walk 150 feet activity   Assist Walk 150 feet activity did not occur: Safety/medical concerns  Assist level: Contact Guard/Touching assist Assistive device: Walker-rolling    Walk 10 feet on uneven  surface  activity   Assist Walk 10 feet on uneven surfaces activity did not occur: Safety/medical concerns   Assist level: Supervision/Verbal cueing Assistive device: Walker-rolling   Wheelchair     Assist Is the patient using a wheelchair?: No Type of Wheelchair: Manual    Wheelchair assist level: Supervision/Verbal cueing Max wheelchair distance: 150    Wheelchair 50 feet with 2 turns activity    Assist        Assist Level: Supervision/Verbal cueing   Wheelchair 150 feet activity     Assist      Assist Level: Supervision/Verbal cueing   Blood pressure 140/85, pulse 87, temperature 97.9 F (36.6 C), temperature source Oral, resp. rate 18, height 5\' 11"  (1.803 m), weight 95 kg, SpO2 100 %.  Medical Problem List and Plan: 1.  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   -Home today or tomorrow depending upon opinion of nephrology.   -f/u with [facility], ID, PCP, HH after discharge  2.  Antithrombotics: -DVT/anticoagulation:  Pharmaceutical: Lovenox- don't anticipate need at discharge              -antiplatelet therapy: Baby aspirin daily Lower extremity pain pretibial swelling ,Doppler venous ultrasound bilateral lower limbs nl  3. Pain Management: OxyContin 10 mg every 12 hours with oxycodone 5 mg every 4 hours as needed Would d/c oxycontin at discharge , increase oxyIR 5mg  QID x 7d, TID x 7d, BID x 7d, daily x 7d- pt can see PCP if he needs additional pain meds              -Robaxin 500 every 6 as needed ordered             -Voltaren gel for topical relief             -Added K pad for muscle spasm and low back pain             -Tylenol for mild pain - can have #5 10mg  toradol to use sparingly at home. May use ibuprofen occasionally as well -using less oxycontin now than he was pta 4. Mood: Team to provide ego support             -  Effexor XR 75 mg daily stopped d/t hyponatremia              -antipsychotic agents: N/A, had been haldol before 5. Neuropsych: This patient is capable of making decisions on his own behalf. -Patient's mental status appears better  6. Skin/Wound Care: contact dermatitis, will start hydrocortisone cream , no change in distribution  7. Fluids/Electrolytes/Severe Protein malutrition: Patient now is on regular diet.  Encourage p.o. intake. 8.  Sepsis secondary to discitis and epidural abscess/bacteremia             -continue IV vancomycin through 9/15- f/u with ID prior to 9/15             -  Monitor clinically and with regular labs 9.  Uncontrolled type 2 diabetes             -Lantus insulin 5 units with sliding scale insulin coverage--increased to 10 units 8/6, increased to 15 on 8/12  CBG (last 3)  Recent Labs    02/17/21 1643 02/17/21 2105 02/18/21 0606  GLUCAP 153* 114* 93   increased lantus to 20U - improved 8/26 10.  Chronic diastolic congestive heart failure             -Monitor daily weights             -Anasarca most likely due to nutritional status as opposed to cardiac             -Recent 2D echo demonstrated no wall abnormalities   Filed Weights   02/02/21 0630 02/03/21 0529 02/17/21 1145  Weight: 102.9 kg 102.7 kg 95 kg   Stable on 8/11 11.  Hyponatremia- off Free H20  I/O looks ok  8/26 Sodium sl up to 125  -appreciate nephrology assistance. Feel it's d/t SSRI, effexor stopped  -  losartan resumed yesterday  -urine sodium high for level of hyponatremia, likely SIADH  -FR 1200cc  -if sodium 125 or greater tomorrow he can go home.             -Hypokalemia, improved , off lasix   8/26 holding at 3.5 today, will not send home on potassium, rec K+ in diet 12. Anemia d/t illness  Hemoglobin stable, check iron studies and stool guaic  given poor recovery of hgb Baseline 11.6 g 13. Thrombocytosis:  Platelets 526 on 8/4, improving  476K on 8/15, recheck in am  14.  Blood pressure   Vitals:   02/17/21 1924 02/18/21 0352  BP: 133/85  140/85  Pulse: 82 87  Resp: 14 18  Temp: 97.9 F (36.6 C) 97.9 F (36.6 C)  SpO2: 99% 100%  8/26 controlled  on Losartan 12.5mg  , Toprol XL 25mg  , Norvasc 10mg       15.  Myalgia - on statin will hold , nl CK, as discussed with patient this may take weeks to improve 16 peripheral edema LE associated with low alb, ? Improving    LOS: 21 days A FACE TO FACE EVALUATION WAS PERFORMED  02/18/2021, 8:08 AM

## 2021-02-18 NOTE — Progress Notes (Signed)
Vital signs upon assessment T-98.8, "P- 83, R-16, BP- 132/79, SPO2- 98% RA

## 2021-02-18 NOTE — Progress Notes (Signed)
Patient reported symptoms of "withdrawals" from the "crazy pill" (effexor), which was D/c'd on 8/25. Patient assessed, no SOB noted, no acute pain at this time, no chills reported or fever noted, no changes in mental status. Vital signs stable at this time. Charge nurse notified + PA aware. Will continue to monitor.

## 2021-02-19 DIAGNOSIS — R7881 Bacteremia: Secondary | ICD-10-CM | POA: Diagnosis not present

## 2021-02-19 DIAGNOSIS — R5381 Other malaise: Secondary | ICD-10-CM | POA: Diagnosis not present

## 2021-02-19 LAB — BASIC METABOLIC PANEL
Anion gap: 8 (ref 5–15)
BUN: <5 mg/dL — ABNORMAL LOW (ref 6–20)
CO2: 23 mmol/L (ref 22–32)
Calcium: 8.4 mg/dL — ABNORMAL LOW (ref 8.9–10.3)
Chloride: 92 mmol/L — ABNORMAL LOW (ref 98–111)
Creatinine, Ser: 0.42 mg/dL — ABNORMAL LOW (ref 0.61–1.24)
GFR, Estimated: >60 mL/min (ref >60)
Glucose, Bld: 121 mg/dL — ABNORMAL HIGH (ref 70–99)
Potassium: 3.5 mmol/L (ref 3.5–5.1)
Sodium: 123 mmol/L — ABNORMAL LOW (ref 135–145)

## 2021-02-19 LAB — GLUCOSE, CAPILLARY
Glucose-Capillary: 122 mg/dL — ABNORMAL HIGH (ref 70–99)
Glucose-Capillary: 161 mg/dL — ABNORMAL HIGH (ref 70–99)
Glucose-Capillary: 263 mg/dL — ABNORMAL HIGH (ref 70–99)
Glucose-Capillary: 197 mg/dL — ABNORMAL HIGH (ref 70–99)

## 2021-02-19 LAB — OCCULT BLOOD X 1 CARD TO LAB, STOOL: Fecal Occult Bld: NEGATIVE

## 2021-02-19 MED ORDER — METHOCARBAMOL 500 MG PO TABS
500.0000 mg | ORAL_TABLET | Freq: Four times a day (QID) | ORAL | Status: DC
  Administered 2021-02-19 – 2021-02-20 (×4): 500 mg via ORAL
  Filled 2021-02-19 (×4): qty 1

## 2021-02-19 MED ORDER — SODIUM CHLORIDE 1 G PO TABS: 1.0000 g | ORAL_TABLET | Freq: Two times a day (BID) | ORAL | Status: DC

## 2021-02-19 MED ORDER — GABAPENTIN 300 MG PO CAPS
300.0000 mg | ORAL_CAPSULE | Freq: Three times a day (TID) | ORAL | Status: DC
  Administered 2021-02-19 – 2021-02-20 (×3): 300 mg via ORAL
  Filled 2021-02-19 (×3): qty 1

## 2021-02-19 MED ORDER — UREA 15 G PO PACK
15.0000 g | PACK | Freq: Two times a day (BID) | ORAL | Status: DC
  Administered 2021-02-19 – 2021-02-20 (×3): 15 g via ORAL
  Filled 2021-02-19 (×3): qty 1

## 2021-02-19 MED ORDER — SORBITOL 70 % SOLN
60.0000 mL | Freq: Once | Status: AC
  Administered 2021-02-19: 60 mL via ORAL
  Filled 2021-02-19: qty 60

## 2021-02-19 MED ORDER — FUROSEMIDE 20 MG PO TABS
20.0000 mg | ORAL_TABLET | Freq: Two times a day (BID) | ORAL | Status: DC
  Administered 2021-02-19 – 2021-02-20 (×3): 20 mg via ORAL
  Filled 2021-02-19 (×3): qty 1

## 2021-02-19 NOTE — Progress Notes (Addendum)
First stool sample sent to lab as ordered for hemocult. Results states "Negative". 2 more samples needed.  Patient gradually having a BM after sorbitol administration.  Medium hard stool noted at this time.

## 2021-02-19 NOTE — Progress Notes (Signed)
Admit: 01/28/2021 LOS: 22  2M AoC Hypotonic Euvolemic Hyponatremia, likely SIADH  Subjective:  Serum sodium worsened to 123 NaCl started by CIR 2.4L UOP, 0.6L input recorded No N/V, AMS, HA, vision changes  08/26 0701 - 08/27 0700 In: 600 [P.O.:300; IV Piggyback:300] Out: 2350 [Urine:2350]  Filed Weights   02/02/21 0630 02/03/21 0529 02/17/21 1145  Weight: 102.9 kg 102.7 kg 95 kg    Scheduled Meds:  amLODipine  10 mg Oral Daily   aspirin EC  81 mg Oral Daily   Chlorhexidine Gluconate Cloth  6 each Topical Daily   diclofenac Sodium  2 g Topical QID   enoxaparin (LOVENOX) injection  40 mg Subcutaneous Q24H   folic acid  1 mg Oral Daily   furosemide  20 mg Oral BID   gabapentin  300 mg Oral TID   hydrocortisone cream   Topical BID   insulin aspart  0-5 Units Subcutaneous QHS   insulin aspart  0-9 Units Subcutaneous TID WC   insulin aspart  4 Units Subcutaneous TID WC   insulin glargine-yfgn  15 Units Subcutaneous Daily   iron polysaccharides  150 mg Oral Daily   losartan  12.5 mg Oral Daily   mouth rinse  15 mL Mouth Rinse BID   melatonin  3 mg Oral QHS   methocarbamol  500 mg Oral Q6H   metoprolol succinate  25 mg Oral Daily   multivitamin with minerals  1 tablet Oral Daily   nutrition supplement (JUVEN)  1 packet Oral TID BM   oxyCODONE  10 mg Oral Q0600   pantoprazole  40 mg Oral BID   Ensure Max Protein  11 oz Oral BID   psyllium  1 packet Oral Daily   sodium chloride flush  10-40 mL Intracatheter Q12H   sodium chloride  1 g Oral BID WC   urea  15 g Oral BID   Continuous Infusions:  sodium chloride 250 mL (02/18/21 2042)   vancomycin 1,500 mg (02/19/21 0918)   PRN Meds:.sodium chloride, acetaminophen, alum & mag hydroxide-simeth, bisacodyl, diclofenac Sodium, diphenhydrAMINE, guaiFENesin-dextromethorphan, heparin lock flush, hydrOXYzine, ondansetron **OR** ondansetron (ZOFRAN) IV, oxyCODONE, polyethylene glycol, prochlorperazine **OR** prochlorperazine **OR**  prochlorperazine, sodium chloride flush, traMADol  Current Labs: reviewed    Physical Exam:  Blood pressure (!) 147/86, pulse 87, temperature 98.2 F (36.8 C), temperature source Oral, resp. rate 14, height 5\' 11"  (1.803 m), weight 95 kg, SpO2 98 %. 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, asymptomatic, partially driven by SSRI; likely needs to be taken more solute HTN on amlodipine, losartan Disckitis and epidural abscess, ongoing vanc; in CIR  P Add on furosmide 20 BID BID Na value  Start Urea 15gm BID NaCl is not very effective, stop Cont to push fluid restriction; choosing high protein foods Take supplemental protein If serum sodium is stable greater than or equal to 125 I think okay for discharge Stay off SSRI  MD 02/19/2021, 10:39 AM  Recent Labs  Lab 02/17/21 0745 02/18/21 0410 02/19/21 0548  NA 124* 125* 123*  K 3.5 3.5 3.5  CL 91* 92* 92*  CO2 23 22 23   GLUCOSE 113* 83 121*  BUN <5* <5* <5*  CREATININE 0.35* 0.45* 0.42*  CALCIUM 8.3* 8.5* 8.4*    Recent Labs  Lab 02/14/21 0322  WBC 5.2  HGB  7.4*  HCT 22.9*  MCV 83.3  PLT 440*

## 2021-02-19 NOTE — Progress Notes (Signed)
PROGRESS NOTE   Subjective/Complaints:  Pt upset cannot go home today- due to NA 123- per nephrology they started Urea 15g BID and Added Lasix 20 mg BID- want him to stay off Effexor.   Pt also reports oxy wears off too early- just didn't last long enough- will try changing to QID prn- also increase gabapentin to 300 mg TID. Na down more to 123-  Is tolerating being off Effexor- but "not great"- hydroxyzine slightly helpful.   ROS:  Pt denies SOB, abd pain, CP, N/V/C/D, and vision changes   Objective:   No results found. No results for input(s): WBC, HGB, HCT, PLT in the last 72 hours.  Recent Labs    02/18/21 0410 02/19/21 0548  NA 125* 123*  K 3.5 3.5  CL 92* 92*  CO2 22 23  GLUCOSE 83 121*  BUN <5* <5*  CREATININE 0.45* 0.42*  CALCIUM 8.5* 8.4*    Intake/Output Summary (Last 24 hours) at 02/19/2021 1347 Last data filed at 02/19/2021 1235 Gross per 24 hour  Intake 840 ml  Output 3175 ml  Net -2335 ml         Physical Exam: Vital Signs Blood pressure (!) 147/86, pulse 87, temperature 98.2 F (36.8 C), temperature source Oral, resp. rate 14, height 5\' 11"  (1.803 m), weight 95 kg, SpO2 98 %.    General: awake, alert, appropriate, sitting up in bed; IV beeping; NAD HENT: conjugate gaze; oropharynx moist CV: regular rate; no JVD Pulmonary: CTA B/L; no W/R/R- good air movement GI: soft, NT, ND, (+)BS Psychiatric: appropriate; but irritable; more than anxious Neurological: alert  Ext: no clubbing, cyanosis, BLE edema  Skin: No evidence of breakdown, 0.5-1cm macules mainly at base of both palms and Right distal lateral 5th digit, no discharge, non tender, skin appears dry. Examined sacrum and gluteal folds. Other than some redness I saw no signs of breakdown, blister MSK- tenderness to palpation Bilateral arms forearms , thighs and calves --is improving Mood and affect approp Neuro: Alert and  oriented Motor: Grossly 4/5 throughout, stable  Assessment/Plan: 1. Functional deficits which require 3+ hours per day of interdisciplinary therapy in a comprehensive inpatient rehab setting. Physiatrist is providing close team supervision and 24 hour management of active medical problems listed below. Physiatrist and rehab team continue to assess barriers to discharge/monitor patient progress toward functional and medical goals  Care Tool:  Bathing  Bathing activity did not occur: Refused Body parts bathed by patient: Right arm, Left arm, Chest, Abdomen, Right upper leg, Left upper leg, Face   Body parts bathed by helper: Front perineal area, Buttocks, Right lower leg, Left lower leg     Bathing assist Assist Level: Moderate Assistance - Patient 50 - 74%     Upper Body Dressing/Undressing Upper body dressing   What is the patient wearing?: Pull over shirt    Upper body assist Assist Level: Independent    Lower Body Dressing/Undressing Lower body dressing      What is the patient wearing?: Pants, Underwear/pull up     Lower body assist Assist for lower body dressing: Maximal Assistance - Patient 25 - 49%     Toileting Toileting  Toileting assist Assist for toileting: Maximal Assistance - Patient 25 - 49% Assistive Device Comment: bed pan   Transfers Chair/bed transfer  Transfers assist     Chair/bed transfer assist level: Supervision/Verbal cueing     Locomotion Ambulation   Ambulation assist      Assist level: Supervision/Verbal cueing Assistive device: Walker-rolling Max distance: 100   Walk 10 feet activity   Assist  Walk 10 feet activity did not occur: Safety/medical concerns  Assist level: Supervision/Verbal cueing Assistive device: Walker-rolling   Walk 50 feet activity   Assist Walk 50 feet with 2 turns activity did not occur: Safety/medical concerns  Assist level: Supervision/Verbal cueing Assistive device: Walker-rolling     Walk 150 feet activity   Assist Walk 150 feet activity did not occur: Safety/medical concerns  Assist level: Contact Guard/Touching assist Assistive device: Walker-rolling    Walk 10 feet on uneven surface  activity   Assist Walk 10 feet on uneven surfaces activity did not occur: Safety/medical concerns   Assist level: Supervision/Verbal cueing Assistive device: Walker-rolling   Wheelchair     Assist Is the patient using a wheelchair?: No Type of Wheelchair: Manual    Wheelchair assist level: Supervision/Verbal cueing Max wheelchair distance: 150    Wheelchair 50 feet with 2 turns activity    Assist        Assist Level: Supervision/Verbal cueing   Wheelchair 150 feet activity     Assist      Assist Level: Supervision/Verbal cueing   Blood pressure (!) 147/86, pulse 87, temperature 98.2 F (36.8 C), temperature source Oral, resp. rate 14, height 5\' 11"  (1.803 m), weight 95 kg, SpO2 98 %.  Medical Problem List and Plan: 1.  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   -Home today or tomorrow depending upon opinion of nephrology.   -f/u with [facility], ID, PCP, HH after discharge  8/27- cannot go home today due to Na of 123-Nephrology helping out- We appreciate this.  2.  Antithrombotics: -DVT/anticoagulation:  Pharmaceutical: Lovenox- don't anticipate need at discharge              -antiplatelet therapy: Baby aspirin daily Lower extremity pain pretibial swelling ,Doppler venous ultrasound bilateral lower limbs nl  3. Pain Management: OxyContin 10 mg every 12 hours with oxycodone 5 mg every 4 hours as needed Would d/c oxycontin at discharge , increase oxyIR 5mg  QID x 7d, TID x 7d, BID x 7d, daily x 7d- pt can see PCP if he needs additional pain meds              -Robaxin 500 every 6 as needed ordered             -Voltaren gel for topical relief             -Added K pad for muscle spasm and low  back pain             -Tylenol for mild pain - can have #5 10mg  toradol to use sparingly at home. May use ibuprofen occasionally as well -using less oxycontin now than he was pta 8/27- will increase oxycodone back to QID for the next 2 days- will go home on TID prn 4. Mood: Team to provide ego support             -  Effexor XR 75 mg daily stopped d/t hyponatremia  8/27- added hydroxyzine prn for anxiety since cannot have Effexor             -  antipsychotic agents: N/A, had been haldol before 5. Neuropsych: This patient is capable of making decisions on his own behalf. -Patient's mental status appears better  6. Skin/Wound Care: contact dermatitis, will start hydrocortisone cream , no change in distribution  7. Fluids/Electrolytes/Severe Protein malutrition: Patient now is on regular diet.  Encourage p.o. intake. 8.  Sepsis secondary to discitis and epidural abscess/bacteremia             -continue IV vancomycin through 9/15- f/u with ID prior to 9/15             -Monitor clinically and with regular labs 9.  Uncontrolled type 2 diabetes             -Lantus insulin 5 units with sliding scale insulin coverage--increased to 10 units 8/6, increased to 15 on 8/12  CBG (last 3)  Recent Labs    02/18/21 2135 02/19/21 0532 02/19/21 1146  GLUCAP 134* 122* 161*    8/27- BG's look a lot bette-r con't regimen 10.  Chronic diastolic congestive heart failure             -Monitor daily weights             -Anasarca most likely due to nutritional status as opposed to cardiac             -Recent 2D echo demonstrated no wall abnormalities   Filed Weights   02/02/21 0630 02/03/21 0529 02/17/21 1145  Weight: 102.9 kg 102.7 kg 95 kg   Stable on 8/11 11.  Hyponatremia- off Free H20  I/O looks ok  8/26 Sodium sl up to 125  -appreciate nephrology assistance. Feel it's d/t SSRI, effexor stopped  -  losartan resumed yesterday  -urine sodium high for level of hyponatremia, likely SIADH  -FR 1200cc  -if  sodium 125 or greater tomorrow he can go home.  8/27- Nephrology started Lasix 20 mg BID and Added Urea 15g BID to help with sodium- stopped the NaCl that I thought might be helpful, but they are the specialist- I appreciate the help.              -Hypokalemia, improved , off lasix   8/26 holding at 3.5 today, will not send home on potassium, rec K+ in diet  8/27- K+ holding at 3.5- labs in AM 12. Anemia d/t illness  Hemoglobin stable, check iron studies and stool guaic  given poor recovery of hgb Baseline 11.6 g 13. Thrombocytosis:  Platelets 526 on 8/4, improving  476K on 8/15, recheck in am  14.  Blood pressure   Vitals:   02/18/21 1913 02/19/21 0338  BP: 137/85 (!) 147/86  Pulse: 84 87  Resp: 14 14  Temp: 98.8 F (37.1 C) 98.2 F (36.8 C)  SpO2: 100% 98%  8/26 controlled  on Losartan 12.5mg  , Toprol XL 25mg  , Norvasc 10mg       15.  Myalgia - on statin will hold , nl CK, as discussed with patient this may take weeks to improve 16 peripheral edema LE associated with low alb, ? Improving    LOS: 22 days A FACE TO FACE EVALUATION WAS PERFORMED  Dorinda Stehr 02/19/2021, 1:47 PM

## 2021-02-20 DIAGNOSIS — R7881 Bacteremia: Secondary | ICD-10-CM | POA: Diagnosis not present

## 2021-02-20 LAB — BASIC METABOLIC PANEL
Anion gap: 6 (ref 5–15)
BUN: 15 mg/dL (ref 6–20)
CO2: 24 mmol/L (ref 22–32)
Calcium: 8.2 mg/dL — ABNORMAL LOW (ref 8.9–10.3)
Chloride: 95 mmol/L — ABNORMAL LOW (ref 98–111)
Creatinine, Ser: 0.49 mg/dL — ABNORMAL LOW (ref 0.61–1.24)
GFR, Estimated: >60 mL/min (ref >60)
Glucose, Bld: 240 mg/dL — ABNORMAL HIGH (ref 70–99)
Potassium: 3.4 mmol/L — ABNORMAL LOW (ref 3.5–5.1)
Sodium: 125 mmol/L — ABNORMAL LOW (ref 135–145)

## 2021-02-20 LAB — GLUCOSE, CAPILLARY: Glucose-Capillary: 209 mg/dL — ABNORMAL HIGH (ref 70–99)

## 2021-02-20 MED ORDER — FUROSEMIDE 20 MG PO TABS: 20.0000 mg | ORAL_TABLET | Freq: Every day | ORAL | 0 refills | Status: AC

## 2021-02-20 MED ORDER — FUROSEMIDE 20 MG PO TABS: 20.0000 mg | ORAL_TABLET | Freq: Every day | ORAL | Status: DC

## 2021-02-20 MED ORDER — GABAPENTIN 300 MG PO CAPS: 300.0000 mg | ORAL_CAPSULE | Freq: Three times a day (TID) | ORAL | 0 refills | Status: AC

## 2021-02-20 MED ORDER — POTASSIUM CHLORIDE CRYS ER 20 MEQ PO TBCR
40.0000 meq | EXTENDED_RELEASE_TABLET | ORAL | Status: AC
  Administered 2021-02-20: 40 meq via ORAL
  Filled 2021-02-20: qty 2

## 2021-02-20 NOTE — Plan of Care (Signed)
All goal met. Patient discharged at this time in care of family. Alert and oriented x 4. Denies any pain upon departure. Tolerated all AM medications. PICC line patent and intact upon departure and received IV vancomycin prior to departure.  All discharge instructions read and given to patient  and spouse.

## 2021-02-20 NOTE — Progress Notes (Signed)
Admit: 01/28/2021 LOS: 23  25M AoC Hypotonic Euvolemic Hyponatremia, likely SIADH  Subjective:  Serum sodium 125; BUN now up to 15 4.4L UOP, neg 4.1L No N/V, AMS, HA, vision changes  08/27 0701 - 08/28 0700 In: 240 [P.O.:240] Out: 4425 [Urine:4425]  Filed Weights   02/02/21 0630 02/03/21 0529 02/17/21 1145  Weight: 102.9 kg 102.7 kg 95 kg    Scheduled Meds:  amLODipine  10 mg Oral Daily   aspirin EC  81 mg Oral Daily   Chlorhexidine Gluconate Cloth  6 each Topical Daily   diclofenac Sodium  2 g Topical QID   enoxaparin (LOVENOX) injection  40 mg Subcutaneous Q24H   folic acid  1 mg Oral Daily   [START ON 02/21/2021] furosemide  20 mg Oral Daily   gabapentin  300 mg Oral TID   hydrocortisone cream   Topical BID   insulin aspart  0-5 Units Subcutaneous QHS   insulin aspart  0-9 Units Subcutaneous TID WC   insulin aspart  4 Units Subcutaneous TID WC   insulin glargine-yfgn  15 Units Subcutaneous Daily   iron polysaccharides  150 mg Oral Daily   losartan  12.5 mg Oral Daily   mouth rinse  15 mL Mouth Rinse BID   melatonin  3 mg Oral QHS   methocarbamol  500 mg Oral Q6H   metoprolol succinate  25 mg Oral Daily   multivitamin with minerals  1 tablet Oral Daily   nutrition supplement (JUVEN)  1 packet Oral TID BM   oxyCODONE  10 mg Oral Q0600   pantoprazole  40 mg Oral BID   Ensure Max Protein  11 oz Oral BID   psyllium  1 packet Oral Daily   sodium chloride flush  10-40 mL Intracatheter Q12H   urea  15 g Oral BID   Continuous Infusions:  sodium chloride 250 mL (02/18/21 2042)   vancomycin 1,500 mg (02/20/21 0921)   PRN Meds:.sodium chloride, acetaminophen, alum & mag hydroxide-simeth, bisacodyl, diclofenac Sodium, diphenhydrAMINE, guaiFENesin-dextromethorphan, heparin lock flush, hydrOXYzine, ondansetron **OR** ondansetron (ZOFRAN) IV, oxyCODONE, polyethylene glycol, prochlorperazine **OR** prochlorperazine **OR** prochlorperazine, sodium chloride flush, traMADol  Current  Labs: reviewed    Physical Exam:  Blood pressure (!) 142/90, pulse 90, temperature 98.3 F (36.8 C), temperature source Oral, resp. rate 16, height 5\' 11"  (1.803 m), weight 95 kg, SpO2 100 %. 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, asymptomatic, partially driven by SSRI; likely needs to be taken more solute HTN on amlodipine, losartan Disckitis and epidural abscess, ongoing vanc; in CIR  P Cont furosmide 20 BID contt Urea 15gm BID while inpatient Choose high protein foods/ supplements at DC Cont to push fluid restriction of 1.2L Stay off SSRI Will arrange f/u labs and OV with [facility]  MD 02/20/2021, 10:49 AM  Recent Labs  Lab 02/18/21 0410 02/19/21 0548 02/20/21 0049  NA 125* 123* 125*  K 3.5 3.5 3.4*  CL 92* 92* 95*  CO2 22 23 24   GLUCOSE 83 121* 240*  BUN <5* <5* 15  CREATININE 0.45* 0.42* 0.49*  CALCIUM 8.5* 8.4* 8.2*    Recent Labs  Lab 02/14/21 0322  WBC 5.2  HGB 7.4*  HCT 22.9*  MCV 83.3  PLT 440*

## 2021-02-21 DIAGNOSIS — R7881 Bacteremia: Secondary | ICD-10-CM | POA: Diagnosis not present

## 2021-02-22 DIAGNOSIS — Z452 Encounter for adjustment and management of vascular access device: Secondary | ICD-10-CM | POA: Diagnosis not present

## 2021-02-22 DIAGNOSIS — I110 Hypertensive heart disease with heart failure: Secondary | ICD-10-CM | POA: Diagnosis not present

## 2021-02-22 DIAGNOSIS — Z7982 Long term (current) use of aspirin: Secondary | ICD-10-CM | POA: Diagnosis not present

## 2021-02-22 DIAGNOSIS — D649 Anemia, unspecified: Secondary | ICD-10-CM | POA: Diagnosis not present

## 2021-02-22 DIAGNOSIS — E871 Hypo-osmolality and hyponatremia: Secondary | ICD-10-CM | POA: Diagnosis not present

## 2021-02-22 DIAGNOSIS — Z794 Long term (current) use of insulin: Secondary | ICD-10-CM | POA: Diagnosis not present

## 2021-02-22 DIAGNOSIS — Z7984 Long term (current) use of oral hypoglycemic drugs: Secondary | ICD-10-CM | POA: Diagnosis not present

## 2021-02-22 DIAGNOSIS — A419 Sepsis, unspecified organism: Secondary | ICD-10-CM | POA: Diagnosis not present

## 2021-02-22 DIAGNOSIS — I5032 Chronic diastolic (congestive) heart failure: Secondary | ICD-10-CM | POA: Diagnosis not present

## 2021-02-22 DIAGNOSIS — I2510 Atherosclerotic heart disease of native coronary artery without angina pectoris: Secondary | ICD-10-CM | POA: Diagnosis not present

## 2021-02-22 DIAGNOSIS — G062 Extradural and subdural abscess, unspecified: Secondary | ICD-10-CM | POA: Diagnosis not present

## 2021-02-22 DIAGNOSIS — F17210 Nicotine dependence, cigarettes, uncomplicated: Secondary | ICD-10-CM | POA: Diagnosis not present

## 2021-02-22 DIAGNOSIS — R7881 Bacteremia: Secondary | ICD-10-CM | POA: Diagnosis not present

## 2021-02-22 DIAGNOSIS — E119 Type 2 diabetes mellitus without complications: Secondary | ICD-10-CM | POA: Diagnosis not present

## 2021-02-22 DIAGNOSIS — M9963 Osseous and subluxation stenosis of intervertebral foramina of lumbar region: Secondary | ICD-10-CM | POA: Diagnosis not present

## 2021-02-22 DIAGNOSIS — E785 Hyperlipidemia, unspecified: Secondary | ICD-10-CM | POA: Diagnosis not present

## 2021-02-22 DIAGNOSIS — D75839 Thrombocytosis, unspecified: Secondary | ICD-10-CM | POA: Diagnosis not present

## 2021-02-23 DIAGNOSIS — F17210 Nicotine dependence, cigarettes, uncomplicated: Secondary | ICD-10-CM | POA: Diagnosis not present

## 2021-02-23 DIAGNOSIS — Z794 Long term (current) use of insulin: Secondary | ICD-10-CM | POA: Diagnosis not present

## 2021-02-23 DIAGNOSIS — G062 Extradural and subdural abscess, unspecified: Secondary | ICD-10-CM | POA: Diagnosis not present

## 2021-02-23 DIAGNOSIS — E871 Hypo-osmolality and hyponatremia: Secondary | ICD-10-CM | POA: Diagnosis not present

## 2021-02-23 DIAGNOSIS — I5032 Chronic diastolic (congestive) heart failure: Secondary | ICD-10-CM | POA: Diagnosis not present

## 2021-02-23 DIAGNOSIS — I2510 Atherosclerotic heart disease of native coronary artery without angina pectoris: Secondary | ICD-10-CM | POA: Diagnosis not present

## 2021-02-23 DIAGNOSIS — M9963 Osseous and subluxation stenosis of intervertebral foramina of lumbar region: Secondary | ICD-10-CM | POA: Diagnosis not present

## 2021-02-23 DIAGNOSIS — E785 Hyperlipidemia, unspecified: Secondary | ICD-10-CM | POA: Diagnosis not present

## 2021-02-23 DIAGNOSIS — I110 Hypertensive heart disease with heart failure: Secondary | ICD-10-CM | POA: Diagnosis not present

## 2021-02-23 DIAGNOSIS — E119 Type 2 diabetes mellitus without complications: Secondary | ICD-10-CM | POA: Diagnosis not present

## 2021-02-23 DIAGNOSIS — D75839 Thrombocytosis, unspecified: Secondary | ICD-10-CM | POA: Diagnosis not present

## 2021-02-23 DIAGNOSIS — Z7982 Long term (current) use of aspirin: Secondary | ICD-10-CM | POA: Diagnosis not present

## 2021-02-23 DIAGNOSIS — E222 Syndrome of inappropriate secretion of antidiuretic hormone: Secondary | ICD-10-CM

## 2021-02-23 DIAGNOSIS — D649 Anemia, unspecified: Secondary | ICD-10-CM | POA: Diagnosis not present

## 2021-02-23 DIAGNOSIS — Z7984 Long term (current) use of oral hypoglycemic drugs: Secondary | ICD-10-CM | POA: Diagnosis not present

## 2021-02-23 DIAGNOSIS — A419 Sepsis, unspecified organism: Secondary | ICD-10-CM | POA: Diagnosis not present

## 2021-02-23 DIAGNOSIS — R7881 Bacteremia: Secondary | ICD-10-CM | POA: Diagnosis not present

## 2021-02-23 DIAGNOSIS — Z452 Encounter for adjustment and management of vascular access device: Secondary | ICD-10-CM | POA: Diagnosis not present

## 2021-02-24 DIAGNOSIS — E871 Hypo-osmolality and hyponatremia: Secondary | ICD-10-CM | POA: Diagnosis not present

## 2021-02-24 DIAGNOSIS — D649 Anemia, unspecified: Secondary | ICD-10-CM | POA: Diagnosis not present

## 2021-02-24 DIAGNOSIS — I5032 Chronic diastolic (congestive) heart failure: Secondary | ICD-10-CM | POA: Diagnosis not present

## 2021-02-24 DIAGNOSIS — Z792 Long term (current) use of antibiotics: Secondary | ICD-10-CM | POA: Diagnosis not present

## 2021-02-24 DIAGNOSIS — A419 Sepsis, unspecified organism: Secondary | ICD-10-CM | POA: Diagnosis not present

## 2021-02-24 DIAGNOSIS — Z794 Long term (current) use of insulin: Secondary | ICD-10-CM | POA: Diagnosis not present

## 2021-02-24 DIAGNOSIS — G062 Extradural and subdural abscess, unspecified: Secondary | ICD-10-CM | POA: Diagnosis not present

## 2021-02-24 DIAGNOSIS — E785 Hyperlipidemia, unspecified: Secondary | ICD-10-CM | POA: Diagnosis not present

## 2021-02-24 DIAGNOSIS — F17210 Nicotine dependence, cigarettes, uncomplicated: Secondary | ICD-10-CM | POA: Diagnosis not present

## 2021-02-24 DIAGNOSIS — E119 Type 2 diabetes mellitus without complications: Secondary | ICD-10-CM | POA: Diagnosis not present

## 2021-02-24 DIAGNOSIS — I2510 Atherosclerotic heart disease of native coronary artery without angina pectoris: Secondary | ICD-10-CM | POA: Diagnosis not present

## 2021-02-24 DIAGNOSIS — M9963 Osseous and subluxation stenosis of intervertebral foramina of lumbar region: Secondary | ICD-10-CM | POA: Diagnosis not present

## 2021-02-24 DIAGNOSIS — Z7984 Long term (current) use of oral hypoglycemic drugs: Secondary | ICD-10-CM | POA: Diagnosis not present

## 2021-02-24 DIAGNOSIS — M4626 Osteomyelitis of vertebra, lumbar region: Secondary | ICD-10-CM | POA: Diagnosis not present

## 2021-02-24 DIAGNOSIS — Z7982 Long term (current) use of aspirin: Secondary | ICD-10-CM | POA: Diagnosis not present

## 2021-02-24 DIAGNOSIS — R7881 Bacteremia: Secondary | ICD-10-CM | POA: Diagnosis not present

## 2021-02-24 DIAGNOSIS — I110 Hypertensive heart disease with heart failure: Secondary | ICD-10-CM | POA: Diagnosis not present

## 2021-02-24 DIAGNOSIS — D75839 Thrombocytosis, unspecified: Secondary | ICD-10-CM | POA: Diagnosis not present

## 2021-02-25 DIAGNOSIS — R7881 Bacteremia: Secondary | ICD-10-CM | POA: Diagnosis not present

## 2021-02-26 DIAGNOSIS — R7881 Bacteremia: Secondary | ICD-10-CM | POA: Diagnosis not present

## 2021-02-27 DIAGNOSIS — R7881 Bacteremia: Secondary | ICD-10-CM | POA: Diagnosis not present

## 2021-02-28 DIAGNOSIS — Z7982 Long term (current) use of aspirin: Secondary | ICD-10-CM | POA: Diagnosis not present

## 2021-02-28 DIAGNOSIS — Z7984 Long term (current) use of oral hypoglycemic drugs: Secondary | ICD-10-CM | POA: Diagnosis not present

## 2021-02-28 DIAGNOSIS — I5032 Chronic diastolic (congestive) heart failure: Secondary | ICD-10-CM | POA: Diagnosis not present

## 2021-02-28 DIAGNOSIS — M9963 Osseous and subluxation stenosis of intervertebral foramina of lumbar region: Secondary | ICD-10-CM | POA: Diagnosis not present

## 2021-02-28 DIAGNOSIS — R7881 Bacteremia: Secondary | ICD-10-CM | POA: Diagnosis not present

## 2021-02-28 DIAGNOSIS — A419 Sepsis, unspecified organism: Secondary | ICD-10-CM | POA: Diagnosis not present

## 2021-02-28 DIAGNOSIS — M4626 Osteomyelitis of vertebra, lumbar region: Secondary | ICD-10-CM | POA: Diagnosis not present

## 2021-02-28 DIAGNOSIS — F17210 Nicotine dependence, cigarettes, uncomplicated: Secondary | ICD-10-CM | POA: Diagnosis not present

## 2021-02-28 DIAGNOSIS — Z792 Long term (current) use of antibiotics: Secondary | ICD-10-CM | POA: Diagnosis not present

## 2021-02-28 DIAGNOSIS — D75839 Thrombocytosis, unspecified: Secondary | ICD-10-CM | POA: Diagnosis not present

## 2021-02-28 DIAGNOSIS — I110 Hypertensive heart disease with heart failure: Secondary | ICD-10-CM | POA: Diagnosis not present

## 2021-02-28 DIAGNOSIS — E119 Type 2 diabetes mellitus without complications: Secondary | ICD-10-CM | POA: Diagnosis not present

## 2021-02-28 DIAGNOSIS — I2510 Atherosclerotic heart disease of native coronary artery without angina pectoris: Secondary | ICD-10-CM | POA: Diagnosis not present

## 2021-02-28 DIAGNOSIS — E871 Hypo-osmolality and hyponatremia: Secondary | ICD-10-CM | POA: Diagnosis not present

## 2021-02-28 DIAGNOSIS — G062 Extradural and subdural abscess, unspecified: Secondary | ICD-10-CM | POA: Diagnosis not present

## 2021-02-28 DIAGNOSIS — D649 Anemia, unspecified: Secondary | ICD-10-CM | POA: Diagnosis not present

## 2021-02-28 DIAGNOSIS — E785 Hyperlipidemia, unspecified: Secondary | ICD-10-CM | POA: Diagnosis not present

## 2021-02-28 DIAGNOSIS — Z794 Long term (current) use of insulin: Secondary | ICD-10-CM | POA: Diagnosis not present

## 2021-03-01 DIAGNOSIS — M4626 Osteomyelitis of vertebra, lumbar region: Secondary | ICD-10-CM | POA: Diagnosis not present

## 2021-03-01 DIAGNOSIS — R7881 Bacteremia: Secondary | ICD-10-CM | POA: Diagnosis not present

## 2021-03-01 DIAGNOSIS — R2689 Other abnormalities of gait and mobility: Secondary | ICD-10-CM | POA: Diagnosis not present

## 2021-03-02 DIAGNOSIS — E1159 Type 2 diabetes mellitus with other circulatory complications: Secondary | ICD-10-CM | POA: Diagnosis not present

## 2021-03-02 DIAGNOSIS — M4626 Osteomyelitis of vertebra, lumbar region: Secondary | ICD-10-CM | POA: Diagnosis not present

## 2021-03-02 DIAGNOSIS — D649 Anemia, unspecified: Secondary | ICD-10-CM | POA: Diagnosis not present

## 2021-03-02 DIAGNOSIS — R7881 Bacteremia: Secondary | ICD-10-CM | POA: Diagnosis not present

## 2021-03-02 DIAGNOSIS — E1169 Type 2 diabetes mellitus with other specified complication: Secondary | ICD-10-CM | POA: Diagnosis not present

## 2021-03-02 DIAGNOSIS — M4620 Osteomyelitis of vertebra, site unspecified: Secondary | ICD-10-CM | POA: Diagnosis not present

## 2021-03-02 DIAGNOSIS — E1165 Type 2 diabetes mellitus with hyperglycemia: Secondary | ICD-10-CM | POA: Diagnosis not present

## 2021-03-02 DIAGNOSIS — M4646 Discitis, unspecified, lumbar region: Secondary | ICD-10-CM | POA: Diagnosis not present

## 2021-03-03 ENCOUNTER — Ambulatory Visit: Payer: Federal, State, Local not specified - PPO | Admitting: Internal Medicine

## 2021-03-03 ENCOUNTER — Other Ambulatory Visit: Payer: Self-pay

## 2021-03-03 VITALS — BP 113/75 | HR 84 | Resp 16 | Ht 71.0 in | Wt 200.0 lb

## 2021-03-03 DIAGNOSIS — E119 Type 2 diabetes mellitus without complications: Secondary | ICD-10-CM | POA: Diagnosis not present

## 2021-03-03 DIAGNOSIS — G062 Extradural and subdural abscess, unspecified: Secondary | ICD-10-CM

## 2021-03-03 DIAGNOSIS — F17210 Nicotine dependence, cigarettes, uncomplicated: Secondary | ICD-10-CM | POA: Diagnosis not present

## 2021-03-03 DIAGNOSIS — E785 Hyperlipidemia, unspecified: Secondary | ICD-10-CM | POA: Diagnosis not present

## 2021-03-03 DIAGNOSIS — Z7982 Long term (current) use of aspirin: Secondary | ICD-10-CM | POA: Diagnosis not present

## 2021-03-03 DIAGNOSIS — B9562 Methicillin resistant Staphylococcus aureus infection as the cause of diseases classified elsewhere: Secondary | ICD-10-CM

## 2021-03-03 DIAGNOSIS — M4620 Osteomyelitis of vertebra, site unspecified: Secondary | ICD-10-CM

## 2021-03-03 DIAGNOSIS — R7881 Bacteremia: Secondary | ICD-10-CM

## 2021-03-03 DIAGNOSIS — I2510 Atherosclerotic heart disease of native coronary artery without angina pectoris: Secondary | ICD-10-CM | POA: Diagnosis not present

## 2021-03-03 DIAGNOSIS — Z794 Long term (current) use of insulin: Secondary | ICD-10-CM | POA: Diagnosis not present

## 2021-03-03 DIAGNOSIS — I110 Hypertensive heart disease with heart failure: Secondary | ICD-10-CM | POA: Diagnosis not present

## 2021-03-03 DIAGNOSIS — Z7984 Long term (current) use of oral hypoglycemic drugs: Secondary | ICD-10-CM | POA: Diagnosis not present

## 2021-03-03 DIAGNOSIS — A419 Sepsis, unspecified organism: Secondary | ICD-10-CM | POA: Diagnosis not present

## 2021-03-03 DIAGNOSIS — E871 Hypo-osmolality and hyponatremia: Secondary | ICD-10-CM | POA: Diagnosis not present

## 2021-03-03 DIAGNOSIS — I5032 Chronic diastolic (congestive) heart failure: Secondary | ICD-10-CM | POA: Diagnosis not present

## 2021-03-03 DIAGNOSIS — D649 Anemia, unspecified: Secondary | ICD-10-CM | POA: Diagnosis not present

## 2021-03-03 DIAGNOSIS — M9963 Osseous and subluxation stenosis of intervertebral foramina of lumbar region: Secondary | ICD-10-CM | POA: Diagnosis not present

## 2021-03-03 DIAGNOSIS — Z792 Long term (current) use of antibiotics: Secondary | ICD-10-CM | POA: Diagnosis not present

## 2021-03-03 DIAGNOSIS — M4626 Osteomyelitis of vertebra, lumbar region: Secondary | ICD-10-CM | POA: Diagnosis not present

## 2021-03-03 DIAGNOSIS — D75839 Thrombocytosis, unspecified: Secondary | ICD-10-CM | POA: Diagnosis not present

## 2021-03-03 MED ORDER — DOXYCYCLINE HYCLATE 100 MG PO TABS: 100.0000 mg | ORAL_TABLET | Freq: Two times a day (BID) | ORAL | 1 refills | Status: AC

## 2021-03-03 NOTE — Progress Notes (Addendum)
[facility]  Patient Active Problem List   Diagnosis Date Noted   SIADH (syndrome of inappropriate ADH production) ([place]) 02/23/2021   Chronic diastolic congestive heart failure ([place])    Uncontrolled type 2 diabetes mellitus with hyperglycemia ([place])    Acute midline low back pain without sciatica    Pressure injury of skin 01/30/2021   Debility 01/29/2021   Protein-calorie malnutrition, severe 01/13/2021   Discitis 01/13/2021   Abnormal EKG 01/13/2021   Thrombocytosis 01/13/2021   Hypomagnesemia 01/13/2021   Hypokalemia 01/13/2021   Oral thrush 01/13/2021   GERD (gastroesophageal reflux disease) 01/13/2021   Cholestasis 01/13/2021   Normocytic anemia 01/06/2021   Hyponatremia 01/06/2021   Anxiety 01/06/2021   Chronic diastolic CHF (congestive heart failure) ([place]) 01/06/2021   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   MRN: [id number]  Chief Complaint  Patient presents with   Follow-up  Cc -- f/u mrsa bsi and thoracolumbar epidural abscess/OM  HPI:  Louis [name] is a 55 y.o. male with dm2 here for f/o of above issues  He was discharged from hospital 8/28 He was initially admitted late 11/2020 for mrsa bacteremia with vertebral om. Course complicated by worsening vertebral epidural abscess s/p I&D 6/28. He was discharged on daptomycin but returned after a  few days on 7/13 with recurrent mrsa bacteremia, which persisted for a week. Daptomycin found to be resistant  He was switched to combination vanc/ceftaroline with sterilization of bsi. His repeat mri doesn't show any focal epidural abscess to debride. Abd/chest ct showed no other occult abscess and tee showed no endocarditis  He was switched to mono vanc after 2 weeks combo abx on 8/05.    --------- 9/08 id clinic f/u Patient remains in significant back pain with radiating sx to his legs. This is the same as during recent hospital admission, maybe a touch better He denies urinary/bowel incontinence He has no focal weakness in LE otherwise He is taking oxycodone 5 mg bid and gabapentin and tramadol prn for pain at a time No constipation/diarrhea No rash No f/c   No Known Allergies    Outpatient Medications Prior to Visit  Medication Sig Dispense Refill   acetaminophen (TYLENOL) 325 MG tablet Take 1-2 tablets (325-650 mg total) by mouth every 4 (four) hours as needed for mild pain.     amLODipine (NORVASC) 10 MG tablet Take 1 tablet (10 mg total) by mouth daily. 30 tablet 0   aspirin EC 81 MG tablet Take 81 mg by mouth daily. Swallow whole.     blood glucose meter kit and supplies KIT Dispense based on patient and insurance preference. Use up to four times daily as directed. 1 each 0   diclofenac Sodium (VOLTAREN) 1 % GEL Apply 2 g topically 4 (four) times daily as needed (pain). 840 g 0   folic acid (FOLVITE) 1 MG tablet Take 1 tablet (  1 mg total) by mouth daily. 30 tablet 0   furosemide (LASIX) 20 MG tablet Take 1 tablet (20 mg total) by mouth daily. 30 tablet 0   gabapentin (NEURONTIN) 100 MG capsule Take 1 capsule (100 mg total) by mouth 2 (two) times daily. 60 capsule 0   gabapentin (NEURONTIN) 300 MG capsule Take 1 capsule (300 mg total) by mouth 3 (three) times daily. 90 capsule 0   hydrocortisone cream 0.5 % Apply topically 2 (two) times daily. 30 g 0   hydrOXYzine  (ATARAX/VISTARIL) 10 MG tablet Take 1 tablet (10 mg total) by mouth 3 (three) times daily as needed for anxiety, nausea or vomiting. 75 tablet 0   insulin aspart (NOVOLOG) 100 UNIT/ML FlexPen Inject 4 Units into the skin 3 (three) times daily with meals. 15 mL 1   Insulin Glargine (BASAGLAR KWIKPEN) 100 UNIT/ML Inject 15 Units into the skin daily. 15 mL 1   Insulin Pen Needle (PEN NEEDLES) 32G X 6 MM MISC 1 application by Does not apply route 2 (two) times daily. 100 each 1   iron polysaccharides (NIFEREX) 150 MG capsule Take 1 capsule (150 mg total) by mouth daily. 30 capsule 0   losartan (COZAAR) 25 MG tablet Take 0.5 tablets (12.5 mg total) by mouth every morning. 30 tablet 0   melatonin 3 MG TABS tablet Take 1 tablet (3 mg total) by mouth at bedtime. 30 tablet 0   methocarbamol (ROBAXIN) 500 MG tablet Take 1 tablet (500 mg total) by mouth every 6 (six) hours as needed for muscle spasms. 120 tablet 0   methocarbamol (ROBAXIN) 500 MG tablet Take 1 tablet (500 mg total) by mouth 4 (four) times daily.     metoprolol succinate (TOPROL-XL) 25 MG 24 hr tablet Take 1 tablet (25 mg total) by mouth daily. 30 tablet 0   Multiple Vitamin (MULTIVITAMIN WITH MINERALS) TABS tablet Take 1 tablet by mouth daily.     nutrition supplement, JUVEN, (JUVEN) PACK Take 1 packet by mouth 3 (three) times daily between meals. 90 packet 0   ondansetron (ZOFRAN) 4 MG tablet Take 1 tablet (4 mg total) by mouth every 6 (six) hours as needed for nausea. 20 tablet 0   oxyCODONE (OXY IR/ROXICODONE) 5 MG immediate release tablet Take 1 tablet (5 mg total) by mouth every 12 (twelve) hours as needed for severe pain. 10 tablet 0   pantoprazole (PROTONIX) 40 MG tablet Take 1 tablet (40 mg total) by mouth 2 (two) times daily. 60 tablet 0   psyllium (HYDROCIL/METAMUCIL) 95 % PACK Take 1 packet by mouth daily. 240 each    traMADol (ULTRAM) 50 MG tablet Take 1 tablet (50 mg total) by mouth every 6 (six) hours as needed for moderate pain  (use for pain rated 4-6/10). 28 tablet 0   vancomycin IVPB Inject 1,500 mg into the vein every 12 (twelve) hours. Indication:  bacteremia First Dose: Yes Last Day of Therapy:  03/10/21 Labs - Sunday/Monday:  CBC/D, BMP, and vancomycin trough. Labs - Thursday:  BMP and vancomycin trough Labs - Every other week:  ESR and CRP Method of administration:Elastomeric Method of administration may be changed at the discretion of the patient and/or caregiver's ability to self-administer the medication ordered. 86 Units 0   No facility-administered medications prior to visit.     Social History   Socioeconomic History   Marital status: Married    Spouse name: Da Authement   Number of children: 1   Years of education: Not on  file   Highest education level: Not on file  Occupational History   Occupation: Postal delivery  Tobacco Use   Smoking status: Every Day    Packs/day: 2.00    Types: Cigarettes    Start date: 06/28/1995   Smokeless tobacco: Never  Vaping Use   Vaping Use: Never used  Substance and Sexual Activity   Alcohol use: Not Currently   Drug use: Never   Sexual activity: Not Currently  Other Topics Concern   Not on file  Social History Narrative   Not on file   Social Determinants of Health   Financial Resource Strain: Not on file  Food Insecurity: Not on file  Transportation Needs: Not on file  Physical Activity: Not on file  Stress: Not on file  Social Connections: Not on file  Intimate Partner Violence: Not on file      Review of Systems    Other ros negative   Objective:    BP 113/75   Pulse 84   Resp 16   Ht _0  (1.803 m)   Wt 200 lb (90.7 kg)   SpO2 95%   BMI 27.89 kg/m  Nursing note and vital signs reviewed.  Physical Exam    General/constitutional: moderate distress, pleasant HEENT: Normocephalic, PER, Conj Clear, EOMI, Oropharynx clear Neck supple CV: rrr no mrg Lungs: clear to auscultation, normal respiratory effort Abd: Soft,  Nontender Ext: no edema Skin: No Rash Neuro: proximal strength of bilateral LE limited by pain, but distal strength 5/5 foot flexion/extension intact symmetric MSK: back spine nontender; incision without dehiscence; no fluctuance/erythema   Central line presence: LUE picc site no erythema/tendenress/fluctuance/purulence    Labs: Lab Results  Component Value Date   WBC 5.2 02/14/2021   HGB 7.4 (L) 02/14/2021   HCT 22.9 (L) 02/14/2021   MCV 83.3 02/14/2021   PLT 440 (H) 59/56/3875   Last metabolic panel Lab Results  Component Value Date   GLUCOSE 240 (H) 02/20/2021   NA 125 (L) 02/20/2021   K 3.4 (L) 02/20/2021   CL 95 (L) 02/20/2021   CO2 24 02/20/2021   BUN 15 02/20/2021   CREATININE 0.49 (L) 02/20/2021   GFRNONAA >60 02/20/2021   CALCIUM 8.2 (L) 02/20/2021   PHOS 3.7 01/27/2021   PROT 5.8 (L) 01/27/2021   ALBUMIN 2.2 (L) 01/27/2021   ALBUMIN 2.2 (L) 01/27/2021   BILITOT 0.5 01/27/2021   ALKPHOS 87 01/27/2021   AST 11 (L) 01/27/2021   ALT 10 01/27/2021   ANIONGAP 6 02/20/2021    Hh lab core ([facility]) 9/8 cbc 5/10/456  Crp: 8/23    5 (<1) 8/17    7.6  7/30    7.2 7/14   31  Vanc trough: 9/8   16  Micro:  Serology:  Imaging: 7/13 mri thoracic lumbar Paraspinal and other soft tissues: Patient is status post left laminectomy at L3. Fluid tracks from the laminectomy site into a subcutaneous and deep soft tissue collection with peripheral enhancement measuring 4.7 x 5.0 x 1.7 cm. The ventral epidural collection extends inferiorly to the L2 level. Separate prominent ventral collection is present posterior to L4 prominently right the left. This collection measures 2.9 x 1.3 cm on the sagittal images.   Disc levels:   In addition to the epidural collections, disc disease is again noted at L4-5 and L5-S1. Central foraminal narrowing are associated.   IMPRESSION: 1. T7 laminectomy subcutaneous peripherally enhancing fluid collection at this  level as well. 2. Residual  posterior epidural fluid collection in the thoracic spine with similar cephalo caudad extension from T5-6-T9 but less mass effect. 3. Progressive signal abnormality and enhancement throughout the T7 and T8 vertebral bodies in the T7-8 thoracic disc consistent with disc osteomyelitis. 4. Progressive paraspinous soft tissue enhancement at T3-4 through T9-10 without drainable collection. 5. Persistent extensive ventral epidural collection compatible with abscess. 6. Status post left laminectomy at L3 with peripherally enhancing fluid collection extending from the operative site into the subcutaneous and deep tissues as described. This is concerning abscess. 7. Fluid tracks from the laminectomy site into a subdural deep soft tissue collection measuring 4.7 x 5.0 x 1.7 cm. 8. Separate ventral epidural collection at L4-5 and L5-S1 consistent with epidural abscess. 9. Progressive signal abnormality and enhancement in the L3 and L4 vertebral bodies. 10. 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     7/17 tte  1. Left ventricular ejection fraction, by estimation, is 55 to 60%. The  left ventricle has normal function. The left ventricle has no regional  wall motion abnormalities. There is mild concentric left ventricular  hypertrophy.   2. Right ventricular systolic function is normal. The right ventricular  size is normal.   3. The mitral valve is grossly normal. Trivial mitral valve  regurgitation. No evidence of mitral stenosis.   4. The aortic valve is grossly normal. Aortic valve regurgitation is not  visualized. No aortic stenosis is present.   5. The inferior vena cava is normal in size with greater than 50%  respiratory variability, suggesting right atrial pressure of 3 mmHg.    7/19 tee No valvular  vegetation   7/20 ct abd pelv with contrast Progressive erosive changes involving the L3 and L4 vertebral bodies in keeping with progressive changes of discitis osteomyelitis. No pathologic fracture. Increasing paraspinal inflammatory collections adjacent to L3-L5 bilaterally without discrete drainable fluid at this time.   Progressive anasarca with development of pleural effusions, mild ascites, and moderate diffuse subcutaneous body wall edema.   Aortic Atherosclerosis   Assessment & Plan:   Problem List Items Addressed This Visit       Nervous and Auditory   Epidural abscess   Relevant Orders   MR THORACIC SPINE W CONTRAST   MR LUMBAR SPINE W CONTRAST   Ambulatory referral to Pain Clinic     Musculoskeletal and Integument   Vertebral osteomyelitis ([place])   Relevant Orders   MR THORACIC SPINE W CONTRAST   MR LUMBAR SPINE W CONTRAST   Ambulatory referral to Pain Clinic     Other   MRSA bacteremia - Primary   Relevant Orders   MR THORACIC SPINE W CONTRAST   MR LUMBAR SPINE W CONTRAST      No orders of the defined types were placed in this encounter.    CC: Mrsa bacteremia Thoracolumbar epidural abscess/osteomyelitis (t5-s1)     Abx: 7/14-20; 7/21-c vanc  7/20-8/05 ceftaroline Previously daptomycin   ASSESSMENT: 55 yo male recent mrsa bacteremia/epidural abscess/OM and psoas abscess s/p open debridement 6/28 and IR drainage of left psoas discharged on daptomycin, now readmitted 7/13 for worsening back pain of a few days found to have recurrent mrsa bacteremia   7/13 & 7/15 & 7/17 & 7/19 & 7/20 bcx mrsa  7/22, 24, 26 bcx negative   7/13 mri lumbar/thoracic showed several areas of fluid collection with a more loculated focus  at L4-5. There is no focal LE neurologic deficit or urinary/bowel incontinence. NSG reviewed case and felt no urgent decompression at this time   7/16 IR reviewed case and percutaneously drained the 5 cm L3 level subdural fluid  collection.   7/17 tte no vegetation 7/19 tee no vegetation 7/20 abd/pelv ct with contrast no occult abscess   Wbc improving on vanc, but persistent bacteremia switched to combination abx therapy 7/20 dapto/ceftaroline --> vanc/ceftaroline on 7/21 (dapto resistant on sensitivity testing 7/13)   Repeat mri no drainable discrete abscess. Discussed with dr Dawley of nsg on 7/21.    As blood cx sterilized on combination therapy, switched back to monotherapy vancomycin on 8/05 with plan to do at least 6 weeks  03/03/21 assessment Significant pain but no other neurological deficit LE otherwise Given significant pain will refer to pain clinic - advise patient to discuss this with his nsg as well Will need repeat mri thoracic and lumbar spine to reassess the epidural abscess Will likely need prolonged oral doxycycline (R bactrim), given significant abscess and prolonged bacteremia and osseous involvement Vanc trough 9/8 is 16  PLAN: Repeat mri lumbar and thoracic spine the next 1-2 weeks Start doxycycline 9/16 -- when vancomycin is finished Remove picc on 9/16 Please follow with neurosurgery Follow up with me in 3-4 weeks [facility]iscussed with [facility] to get weekly cbc, cmp, crp, and vanc trough while on iv abx 10 days of oxycontin 15 mg po bid rx (hard copy given)   Follow-up: Return in about 4 weeks (around 03/31/2021).      Jabier Mutton, [facility] [phone]  pager   [phone] cell 03/03/2021, 2:12 PM

## 2021-03-03 NOTE — Addendum Note (Signed)
Addended by: Rutha Bouchard T on: 03/03/2021 03:41 PM   Modules accepted: Orders

## 2021-03-03 NOTE — Patient Instructions (Signed)
I have placed a referal for pain management clinic. Please discuss your pain level with your surgeon too as we might have to repeat mri of your back sooner (there is plan to repeat it regardless of your pain level)   For now the plan is to have you on vancomycin until 03/11/2021  We likely will need oral antibiotics for ongoing treatment after that (I have prescribed doxycycline, please pick up rx and start on 9/16)  See me the beginning of October for follow up

## 2021-03-04 ENCOUNTER — Encounter: Payer: Federal, State, Local not specified - PPO | Admitting: Registered Nurse

## 2021-03-04 DIAGNOSIS — R7881 Bacteremia: Secondary | ICD-10-CM | POA: Diagnosis not present

## 2021-03-05 DIAGNOSIS — R7881 Bacteremia: Secondary | ICD-10-CM | POA: Diagnosis not present

## 2021-03-06 DIAGNOSIS — R7881 Bacteremia: Secondary | ICD-10-CM | POA: Diagnosis not present

## 2021-03-07 ENCOUNTER — Ambulatory Visit (HOSPITAL_COMMUNITY)
Admission: RE | Admit: 2021-03-07 | Discharge: 2021-03-07 | Disposition: A | Discharge status: Home / Self Care | Payer: Federal, State, Local not specified - PPO | Source: Ambulatory Visit | Attending: Internal Medicine | Admitting: Internal Medicine

## 2021-03-07 ENCOUNTER — Ambulatory Visit (HOSPITAL_COMMUNITY): Payer: Federal, State, Local not specified - PPO

## 2021-03-07 DIAGNOSIS — I110 Hypertensive heart disease with heart failure: Secondary | ICD-10-CM | POA: Diagnosis not present

## 2021-03-07 DIAGNOSIS — Z7984 Long term (current) use of oral hypoglycemic drugs: Secondary | ICD-10-CM | POA: Diagnosis not present

## 2021-03-07 DIAGNOSIS — G062 Extradural and subdural abscess, unspecified: Secondary | ICD-10-CM | POA: Insufficient documentation

## 2021-03-07 DIAGNOSIS — B9562 Methicillin resistant Staphylococcus aureus infection as the cause of diseases classified elsewhere: Secondary | ICD-10-CM | POA: Insufficient documentation

## 2021-03-07 DIAGNOSIS — R7881 Bacteremia: Secondary | ICD-10-CM | POA: Insufficient documentation

## 2021-03-07 DIAGNOSIS — M4646 Discitis, unspecified, lumbar region: Secondary | ICD-10-CM | POA: Diagnosis not present

## 2021-03-07 DIAGNOSIS — M9963 Osseous and subluxation stenosis of intervertebral foramina of lumbar region: Secondary | ICD-10-CM | POA: Diagnosis not present

## 2021-03-07 DIAGNOSIS — M4620 Osteomyelitis of vertebra, site unspecified: Secondary | ICD-10-CM | POA: Diagnosis not present

## 2021-03-07 DIAGNOSIS — F17210 Nicotine dependence, cigarettes, uncomplicated: Secondary | ICD-10-CM | POA: Diagnosis not present

## 2021-03-07 DIAGNOSIS — E785 Hyperlipidemia, unspecified: Secondary | ICD-10-CM | POA: Diagnosis not present

## 2021-03-07 DIAGNOSIS — D75839 Thrombocytosis, unspecified: Secondary | ICD-10-CM | POA: Diagnosis not present

## 2021-03-07 DIAGNOSIS — Z794 Long term (current) use of insulin: Secondary | ICD-10-CM | POA: Diagnosis not present

## 2021-03-07 DIAGNOSIS — D649 Anemia, unspecified: Secondary | ICD-10-CM | POA: Diagnosis not present

## 2021-03-07 DIAGNOSIS — G9520 Unspecified cord compression: Secondary | ICD-10-CM | POA: Diagnosis not present

## 2021-03-07 DIAGNOSIS — E871 Hypo-osmolality and hyponatremia: Secondary | ICD-10-CM | POA: Diagnosis not present

## 2021-03-07 DIAGNOSIS — Z792 Long term (current) use of antibiotics: Secondary | ICD-10-CM | POA: Diagnosis not present

## 2021-03-07 DIAGNOSIS — E119 Type 2 diabetes mellitus without complications: Secondary | ICD-10-CM | POA: Diagnosis not present

## 2021-03-07 DIAGNOSIS — A419 Sepsis, unspecified organism: Secondary | ICD-10-CM | POA: Diagnosis not present

## 2021-03-07 DIAGNOSIS — I5032 Chronic diastolic (congestive) heart failure: Secondary | ICD-10-CM | POA: Diagnosis not present

## 2021-03-07 DIAGNOSIS — M4626 Osteomyelitis of vertebra, lumbar region: Secondary | ICD-10-CM | POA: Diagnosis not present

## 2021-03-07 DIAGNOSIS — Z7982 Long term (current) use of aspirin: Secondary | ICD-10-CM | POA: Diagnosis not present

## 2021-03-07 DIAGNOSIS — G834 Cauda equina syndrome: Secondary | ICD-10-CM | POA: Diagnosis not present

## 2021-03-07 DIAGNOSIS — I2510 Atherosclerotic heart disease of native coronary artery without angina pectoris: Secondary | ICD-10-CM | POA: Diagnosis not present

## 2021-03-07 DIAGNOSIS — J90 Pleural effusion, not elsewhere classified: Secondary | ICD-10-CM | POA: Diagnosis not present

## 2021-03-08 DIAGNOSIS — R7881 Bacteremia: Secondary | ICD-10-CM | POA: Diagnosis not present

## 2021-03-09 ENCOUNTER — Encounter: Payer: Self-pay | Admitting: Pharmacist

## 2021-03-09 ENCOUNTER — Telehealth: Payer: Self-pay

## 2021-03-09 DIAGNOSIS — I110 Hypertensive heart disease with heart failure: Secondary | ICD-10-CM | POA: Diagnosis not present

## 2021-03-09 DIAGNOSIS — M9963 Osseous and subluxation stenosis of intervertebral foramina of lumbar region: Secondary | ICD-10-CM | POA: Diagnosis not present

## 2021-03-09 DIAGNOSIS — Z7982 Long term (current) use of aspirin: Secondary | ICD-10-CM | POA: Diagnosis not present

## 2021-03-09 DIAGNOSIS — E871 Hypo-osmolality and hyponatremia: Secondary | ICD-10-CM | POA: Diagnosis not present

## 2021-03-09 DIAGNOSIS — Z7984 Long term (current) use of oral hypoglycemic drugs: Secondary | ICD-10-CM | POA: Diagnosis not present

## 2021-03-09 DIAGNOSIS — E119 Type 2 diabetes mellitus without complications: Secondary | ICD-10-CM | POA: Diagnosis not present

## 2021-03-09 DIAGNOSIS — E785 Hyperlipidemia, unspecified: Secondary | ICD-10-CM | POA: Diagnosis not present

## 2021-03-09 DIAGNOSIS — I5032 Chronic diastolic (congestive) heart failure: Secondary | ICD-10-CM | POA: Diagnosis not present

## 2021-03-09 DIAGNOSIS — D649 Anemia, unspecified: Secondary | ICD-10-CM | POA: Diagnosis not present

## 2021-03-09 DIAGNOSIS — Z792 Long term (current) use of antibiotics: Secondary | ICD-10-CM | POA: Diagnosis not present

## 2021-03-09 DIAGNOSIS — D75839 Thrombocytosis, unspecified: Secondary | ICD-10-CM | POA: Diagnosis not present

## 2021-03-09 DIAGNOSIS — I2510 Atherosclerotic heart disease of native coronary artery without angina pectoris: Secondary | ICD-10-CM | POA: Diagnosis not present

## 2021-03-09 DIAGNOSIS — R7881 Bacteremia: Secondary | ICD-10-CM | POA: Diagnosis not present

## 2021-03-09 DIAGNOSIS — Z794 Long term (current) use of insulin: Secondary | ICD-10-CM | POA: Diagnosis not present

## 2021-03-09 DIAGNOSIS — A419 Sepsis, unspecified organism: Secondary | ICD-10-CM | POA: Diagnosis not present

## 2021-03-09 DIAGNOSIS — G062 Extradural and subdural abscess, unspecified: Secondary | ICD-10-CM | POA: Diagnosis not present

## 2021-03-09 DIAGNOSIS — F17210 Nicotine dependence, cigarettes, uncomplicated: Secondary | ICD-10-CM | POA: Diagnosis not present

## 2021-03-09 NOTE — Telephone Encounter (Signed)
Verbal orders given to [facility] with [facility] to extend IV vancomycin until 10/14 with weekly labs continuing (CBC, CMP, CRP and vanc trough). Forwarding to pharmacy team to update; RN will reach out to patient to instruct not to start oral doxy on 9/16 as originally planned.   Ariez Neilan Loyola Mast, RN

## 2021-03-09 NOTE — Telephone Encounter (Signed)
Thanks for calling, Dorie!

## 2021-03-09 NOTE — Telephone Encounter (Signed)
Attempted to LVM but patient's mailbox is full. Will send patient message stating to hold on starting the doxycycline until after his next appointment with Dr. Renold Don in October.

## 2021-03-10 DIAGNOSIS — R7881 Bacteremia: Secondary | ICD-10-CM | POA: Diagnosis not present

## 2021-03-11 DIAGNOSIS — R7881 Bacteremia: Secondary | ICD-10-CM | POA: Diagnosis not present

## 2021-03-13 ENCOUNTER — Other Ambulatory Visit: Payer: Self-pay | Admitting: Physical Medicine and Rehabilitation

## 2021-03-13 DIAGNOSIS — R7881 Bacteremia: Secondary | ICD-10-CM | POA: Diagnosis not present

## 2021-03-14 DIAGNOSIS — E785 Hyperlipidemia, unspecified: Secondary | ICD-10-CM | POA: Diagnosis not present

## 2021-03-14 DIAGNOSIS — E871 Hypo-osmolality and hyponatremia: Secondary | ICD-10-CM | POA: Diagnosis not present

## 2021-03-14 DIAGNOSIS — A419 Sepsis, unspecified organism: Secondary | ICD-10-CM | POA: Diagnosis not present

## 2021-03-14 DIAGNOSIS — F17210 Nicotine dependence, cigarettes, uncomplicated: Secondary | ICD-10-CM | POA: Diagnosis not present

## 2021-03-14 DIAGNOSIS — Z794 Long term (current) use of insulin: Secondary | ICD-10-CM | POA: Diagnosis not present

## 2021-03-14 DIAGNOSIS — D75839 Thrombocytosis, unspecified: Secondary | ICD-10-CM | POA: Diagnosis not present

## 2021-03-14 DIAGNOSIS — E119 Type 2 diabetes mellitus without complications: Secondary | ICD-10-CM | POA: Diagnosis not present

## 2021-03-14 DIAGNOSIS — G062 Extradural and subdural abscess, unspecified: Secondary | ICD-10-CM | POA: Diagnosis not present

## 2021-03-14 DIAGNOSIS — Z7984 Long term (current) use of oral hypoglycemic drugs: Secondary | ICD-10-CM | POA: Diagnosis not present

## 2021-03-14 DIAGNOSIS — R7881 Bacteremia: Secondary | ICD-10-CM | POA: Diagnosis not present

## 2021-03-14 DIAGNOSIS — I2510 Atherosclerotic heart disease of native coronary artery without angina pectoris: Secondary | ICD-10-CM | POA: Diagnosis not present

## 2021-03-14 DIAGNOSIS — Z7982 Long term (current) use of aspirin: Secondary | ICD-10-CM | POA: Diagnosis not present

## 2021-03-14 DIAGNOSIS — I5032 Chronic diastolic (congestive) heart failure: Secondary | ICD-10-CM | POA: Diagnosis not present

## 2021-03-14 DIAGNOSIS — D649 Anemia, unspecified: Secondary | ICD-10-CM | POA: Diagnosis not present

## 2021-03-14 DIAGNOSIS — I110 Hypertensive heart disease with heart failure: Secondary | ICD-10-CM | POA: Diagnosis not present

## 2021-03-14 DIAGNOSIS — Z792 Long term (current) use of antibiotics: Secondary | ICD-10-CM | POA: Diagnosis not present

## 2021-03-14 DIAGNOSIS — M9963 Osseous and subluxation stenosis of intervertebral foramina of lumbar region: Secondary | ICD-10-CM | POA: Diagnosis not present

## 2021-03-14 NOTE — Telephone Encounter (Signed)
I called the PCP. Prescriptions have been refilled.

## 2021-03-15 DIAGNOSIS — R7881 Bacteremia: Secondary | ICD-10-CM | POA: Diagnosis not present

## 2021-03-15 NOTE — Telephone Encounter (Signed)
Needs to go to PCP listed on DC summary

## 2021-03-16 DIAGNOSIS — R7881 Bacteremia: Secondary | ICD-10-CM | POA: Diagnosis not present

## 2021-03-17 DIAGNOSIS — R7881 Bacteremia: Secondary | ICD-10-CM | POA: Diagnosis not present

## 2021-03-18 DIAGNOSIS — R7881 Bacteremia: Secondary | ICD-10-CM | POA: Diagnosis not present

## 2021-03-19 DIAGNOSIS — R7881 Bacteremia: Secondary | ICD-10-CM | POA: Diagnosis not present

## 2021-03-21 DIAGNOSIS — D75839 Thrombocytosis, unspecified: Secondary | ICD-10-CM | POA: Diagnosis not present

## 2021-03-21 DIAGNOSIS — E119 Type 2 diabetes mellitus without complications: Secondary | ICD-10-CM | POA: Diagnosis not present

## 2021-03-21 DIAGNOSIS — Z7984 Long term (current) use of oral hypoglycemic drugs: Secondary | ICD-10-CM | POA: Diagnosis not present

## 2021-03-21 DIAGNOSIS — M9963 Osseous and subluxation stenosis of intervertebral foramina of lumbar region: Secondary | ICD-10-CM | POA: Diagnosis not present

## 2021-03-21 DIAGNOSIS — A419 Sepsis, unspecified organism: Secondary | ICD-10-CM | POA: Diagnosis not present

## 2021-03-21 DIAGNOSIS — G062 Extradural and subdural abscess, unspecified: Secondary | ICD-10-CM | POA: Diagnosis not present

## 2021-03-21 DIAGNOSIS — E871 Hypo-osmolality and hyponatremia: Secondary | ICD-10-CM | POA: Diagnosis not present

## 2021-03-21 DIAGNOSIS — I2510 Atherosclerotic heart disease of native coronary artery without angina pectoris: Secondary | ICD-10-CM | POA: Diagnosis not present

## 2021-03-21 DIAGNOSIS — F17210 Nicotine dependence, cigarettes, uncomplicated: Secondary | ICD-10-CM | POA: Diagnosis not present

## 2021-03-21 DIAGNOSIS — Z792 Long term (current) use of antibiotics: Secondary | ICD-10-CM | POA: Diagnosis not present

## 2021-03-21 DIAGNOSIS — E785 Hyperlipidemia, unspecified: Secondary | ICD-10-CM | POA: Diagnosis not present

## 2021-03-21 DIAGNOSIS — I5032 Chronic diastolic (congestive) heart failure: Secondary | ICD-10-CM | POA: Diagnosis not present

## 2021-03-21 DIAGNOSIS — Z7982 Long term (current) use of aspirin: Secondary | ICD-10-CM | POA: Diagnosis not present

## 2021-03-21 DIAGNOSIS — Z794 Long term (current) use of insulin: Secondary | ICD-10-CM | POA: Diagnosis not present

## 2021-03-21 DIAGNOSIS — R7881 Bacteremia: Secondary | ICD-10-CM | POA: Diagnosis not present

## 2021-03-21 DIAGNOSIS — D649 Anemia, unspecified: Secondary | ICD-10-CM | POA: Diagnosis not present

## 2021-03-21 DIAGNOSIS — I110 Hypertensive heart disease with heart failure: Secondary | ICD-10-CM | POA: Diagnosis not present

## 2021-03-22 DIAGNOSIS — F419 Anxiety disorder, unspecified: Secondary | ICD-10-CM | POA: Diagnosis not present

## 2021-03-22 DIAGNOSIS — I10 Essential (primary) hypertension: Secondary | ICD-10-CM | POA: Diagnosis not present

## 2021-03-22 DIAGNOSIS — M4626 Osteomyelitis of vertebra, lumbar region: Secondary | ICD-10-CM | POA: Diagnosis not present

## 2021-03-22 DIAGNOSIS — G894 Chronic pain syndrome: Secondary | ICD-10-CM | POA: Diagnosis not present

## 2021-03-22 DIAGNOSIS — E1169 Type 2 diabetes mellitus with other specified complication: Secondary | ICD-10-CM | POA: Diagnosis not present

## 2021-03-22 DIAGNOSIS — E1165 Type 2 diabetes mellitus with hyperglycemia: Secondary | ICD-10-CM | POA: Diagnosis not present

## 2021-03-22 DIAGNOSIS — M4646 Discitis, unspecified, lumbar region: Secondary | ICD-10-CM | POA: Diagnosis not present

## 2021-03-22 DIAGNOSIS — E1159 Type 2 diabetes mellitus with other circulatory complications: Secondary | ICD-10-CM | POA: Diagnosis not present

## 2021-03-22 DIAGNOSIS — R7881 Bacteremia: Secondary | ICD-10-CM | POA: Diagnosis not present

## 2021-03-23 DIAGNOSIS — D649 Anemia, unspecified: Secondary | ICD-10-CM | POA: Diagnosis not present

## 2021-03-23 DIAGNOSIS — E119 Type 2 diabetes mellitus without complications: Secondary | ICD-10-CM | POA: Diagnosis not present

## 2021-03-23 DIAGNOSIS — F17210 Nicotine dependence, cigarettes, uncomplicated: Secondary | ICD-10-CM | POA: Diagnosis not present

## 2021-03-23 DIAGNOSIS — Z7984 Long term (current) use of oral hypoglycemic drugs: Secondary | ICD-10-CM | POA: Diagnosis not present

## 2021-03-23 DIAGNOSIS — D75839 Thrombocytosis, unspecified: Secondary | ICD-10-CM | POA: Diagnosis not present

## 2021-03-23 DIAGNOSIS — G062 Extradural and subdural abscess, unspecified: Secondary | ICD-10-CM | POA: Diagnosis not present

## 2021-03-23 DIAGNOSIS — A419 Sepsis, unspecified organism: Secondary | ICD-10-CM | POA: Diagnosis not present

## 2021-03-23 DIAGNOSIS — R7881 Bacteremia: Secondary | ICD-10-CM | POA: Diagnosis not present

## 2021-03-23 DIAGNOSIS — Z794 Long term (current) use of insulin: Secondary | ICD-10-CM | POA: Diagnosis not present

## 2021-03-23 DIAGNOSIS — I2510 Atherosclerotic heart disease of native coronary artery without angina pectoris: Secondary | ICD-10-CM | POA: Diagnosis not present

## 2021-03-23 DIAGNOSIS — Z7982 Long term (current) use of aspirin: Secondary | ICD-10-CM | POA: Diagnosis not present

## 2021-03-23 DIAGNOSIS — E871 Hypo-osmolality and hyponatremia: Secondary | ICD-10-CM | POA: Diagnosis not present

## 2021-03-23 DIAGNOSIS — I110 Hypertensive heart disease with heart failure: Secondary | ICD-10-CM | POA: Diagnosis not present

## 2021-03-23 DIAGNOSIS — I5032 Chronic diastolic (congestive) heart failure: Secondary | ICD-10-CM | POA: Diagnosis not present

## 2021-03-23 DIAGNOSIS — E785 Hyperlipidemia, unspecified: Secondary | ICD-10-CM | POA: Diagnosis not present

## 2021-03-23 DIAGNOSIS — M9963 Osseous and subluxation stenosis of intervertebral foramina of lumbar region: Secondary | ICD-10-CM | POA: Diagnosis not present

## 2021-03-24 DIAGNOSIS — R7881 Bacteremia: Secondary | ICD-10-CM | POA: Diagnosis not present

## 2021-03-25 DIAGNOSIS — R7881 Bacteremia: Secondary | ICD-10-CM | POA: Diagnosis not present

## 2021-03-26 DIAGNOSIS — R7881 Bacteremia: Secondary | ICD-10-CM | POA: Diagnosis not present

## 2021-03-27 DIAGNOSIS — Z7984 Long term (current) use of oral hypoglycemic drugs: Secondary | ICD-10-CM | POA: Diagnosis not present

## 2021-03-27 DIAGNOSIS — I110 Hypertensive heart disease with heart failure: Secondary | ICD-10-CM | POA: Diagnosis not present

## 2021-03-27 DIAGNOSIS — Z792 Long term (current) use of antibiotics: Secondary | ICD-10-CM | POA: Diagnosis not present

## 2021-03-27 DIAGNOSIS — F17210 Nicotine dependence, cigarettes, uncomplicated: Secondary | ICD-10-CM | POA: Diagnosis not present

## 2021-03-27 DIAGNOSIS — M9963 Osseous and subluxation stenosis of intervertebral foramina of lumbar region: Secondary | ICD-10-CM | POA: Diagnosis not present

## 2021-03-27 DIAGNOSIS — R7881 Bacteremia: Secondary | ICD-10-CM | POA: Diagnosis not present

## 2021-03-27 DIAGNOSIS — E871 Hypo-osmolality and hyponatremia: Secondary | ICD-10-CM | POA: Diagnosis not present

## 2021-03-27 DIAGNOSIS — G062 Extradural and subdural abscess, unspecified: Secondary | ICD-10-CM | POA: Diagnosis not present

## 2021-03-27 DIAGNOSIS — Z7982 Long term (current) use of aspirin: Secondary | ICD-10-CM | POA: Diagnosis not present

## 2021-03-27 DIAGNOSIS — E119 Type 2 diabetes mellitus without complications: Secondary | ICD-10-CM | POA: Diagnosis not present

## 2021-03-27 DIAGNOSIS — E785 Hyperlipidemia, unspecified: Secondary | ICD-10-CM | POA: Diagnosis not present

## 2021-03-27 DIAGNOSIS — I5032 Chronic diastolic (congestive) heart failure: Secondary | ICD-10-CM | POA: Diagnosis not present

## 2021-03-27 DIAGNOSIS — I2510 Atherosclerotic heart disease of native coronary artery without angina pectoris: Secondary | ICD-10-CM | POA: Diagnosis not present

## 2021-03-27 DIAGNOSIS — D649 Anemia, unspecified: Secondary | ICD-10-CM | POA: Diagnosis not present

## 2021-03-27 DIAGNOSIS — D75839 Thrombocytosis, unspecified: Secondary | ICD-10-CM | POA: Diagnosis not present

## 2021-03-27 DIAGNOSIS — Z794 Long term (current) use of insulin: Secondary | ICD-10-CM | POA: Diagnosis not present

## 2021-03-27 DIAGNOSIS — A419 Sepsis, unspecified organism: Secondary | ICD-10-CM | POA: Diagnosis not present

## 2021-03-28 DIAGNOSIS — R7881 Bacteremia: Secondary | ICD-10-CM | POA: Diagnosis not present

## 2021-03-29 DIAGNOSIS — R7881 Bacteremia: Secondary | ICD-10-CM | POA: Diagnosis not present

## 2021-03-30 ENCOUNTER — Telehealth: Payer: Self-pay | Admitting: Pharmacist

## 2021-03-30 DIAGNOSIS — R7881 Bacteremia: Secondary | ICD-10-CM | POA: Diagnosis not present

## 2021-03-30 NOTE — Telephone Encounter (Signed)
Gave verbal order to Duwayne Heck, one of the [facility]ists, for cathflo.   Margarite Gouge, PharmD, CPP Clinical Pharmacist Practitioner Infectious Diseases Clinical Pharmacist [facility]

## 2021-03-31 DIAGNOSIS — M4626 Osteomyelitis of vertebra, lumbar region: Secondary | ICD-10-CM | POA: Diagnosis not present

## 2021-03-31 DIAGNOSIS — R2689 Other abnormalities of gait and mobility: Secondary | ICD-10-CM | POA: Diagnosis not present

## 2021-03-31 DIAGNOSIS — R7881 Bacteremia: Secondary | ICD-10-CM | POA: Diagnosis not present

## 2021-04-01 ENCOUNTER — Other Ambulatory Visit: Payer: Self-pay

## 2021-04-01 ENCOUNTER — Ambulatory Visit (INDEPENDENT_AMBULATORY_CARE_PROVIDER_SITE_OTHER): Payer: Federal, State, Local not specified - PPO | Admitting: Internal Medicine

## 2021-04-01 ENCOUNTER — Encounter: Payer: Self-pay | Admitting: Internal Medicine

## 2021-04-01 ENCOUNTER — Telehealth: Payer: Self-pay

## 2021-04-01 VITALS — BP 130/87 | HR 79 | Wt 185.0 lb

## 2021-04-01 DIAGNOSIS — G062 Extradural and subdural abscess, unspecified: Secondary | ICD-10-CM

## 2021-04-01 DIAGNOSIS — R7881 Bacteremia: Secondary | ICD-10-CM | POA: Diagnosis not present

## 2021-04-01 DIAGNOSIS — M4620 Osteomyelitis of vertebra, site unspecified: Secondary | ICD-10-CM

## 2021-04-01 DIAGNOSIS — M60009 Infective myositis, unspecified site: Secondary | ICD-10-CM | POA: Diagnosis not present

## 2021-04-01 NOTE — Progress Notes (Addendum)
[facility]  Patient Active Problem List   Diagnosis Date Noted   SIADH (syndrome of inappropriate ADH production) ([place]) 02/23/2021   Chronic diastolic congestive heart failure ([place])    Uncontrolled type 2 diabetes mellitus with hyperglycemia ([place])    Acute midline low back pain without sciatica    Pressure injury of skin 01/30/2021   Debility 01/29/2021   Protein-calorie malnutrition, severe 01/13/2021   Discitis 01/13/2021   Abnormal EKG 01/13/2021   Thrombocytosis 01/13/2021   Hypomagnesemia 01/13/2021   Hypokalemia 01/13/2021   Oral thrush 01/13/2021   GERD (gastroesophageal reflux disease) 01/13/2021   Cholestasis 01/13/2021   Normocytic anemia 01/06/2021   Hyponatremia 01/06/2021   Anxiety 01/06/2021   Chronic diastolic CHF (congestive heart failure) ([place]) 01/06/2021   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   MRN: [id number]  Chief Complaint  Patient presents with   Follow-up    Still receiving IV abx; has not started oral doxy; reports fall 2 weeks ago   Cc -- f/u mrsa bsi and thoracolumbar epidural abscess/OM  HPI:  Louis [name] is a 55 y.o. male with dm2 here for f/o of above issues  He was discharged from hospital 8/28 He was initially admitted late 11/2020 for mrsa bacteremia with vertebral om. Course complicated by worsening vertebral  epidural abscess s/p I&D 6/28. He was discharged on daptomycin but returned after a few days on 7/13 with recurrent mrsa bacteremia, which persisted for a week. Daptomycin found to be resistant  He was switched to combination vanc/ceftaroline with sterilization of bsi. His repeat mri doesn't show any focal epidural abscess to debride. Abd/chest ct showed no other occult abscess and tee showed no endocarditis  He was switched to mono vanc after 2 weeks combo abx on 8/05.    --------- 9/08 id clinic f/u Patient remains in significant back pain with radiating sx to his legs. This is the same as during recent hospital admission, maybe a touch better He denies urinary/bowel incontinence He has no focal weakness in LE otherwise He is taking oxycodone 5 mg bid and gabapentin and tramadol prn for pain at a time No constipation/diarrhea No rash No f/c   10/07 id clinic f/u Patient saw dr Dawley after my last visit. Nothing special is planned. Dr Reatha Armour will graciously continue to follow and review any back mri/imaging ordered Patient reports sweating at night drenching since the beginning of this infection No actual fever No rash No diarrhea He remains on vancomycin with HH  I still do not have labs at all in [facility]/from opat team (who is in our clinic and receives any faxed labs)/epic.  Pain is still severe. He'll be seeing the pain clinic next week. He currently takes oxycodone three times a done, gabapentin also three times a day, and tramadol, and preivously mobic  His PCP is at [place], [place] where he sees often  for medical care    No Known Allergies    Outpatient Medications Prior to Visit  Medication Sig Dispense Refill   acetaminophen (TYLENOL) 325 MG tablet Take 1-2 tablets (325-650 mg total) by mouth every 4 (four) hours as needed for mild pain.     amLODipine (NORVASC) 10 MG tablet Take 1 tablet (10 mg total) by mouth daily. 30 tablet 0   aspirin EC 81 MG tablet Take 81 mg  by mouth daily. Swallow whole.     blood glucose meter kit and supplies KIT Dispense based on patient and insurance preference. Use up to four times daily as directed. 1 each 0   diclofenac Sodium (VOLTAREN) 1 % GEL Apply 2 g topically 4 (four) times daily as needed (pain). 350 g 0   doxycycline (VIBRA-TABS) 100 MG tablet Take 1 tablet (100 mg total) by mouth 2 (two) times daily. 60 tablet 1   folic acid (FOLVITE) 1 MG tablet Take 1 tablet (1 mg total) by mouth daily. 30 tablet 0   furosemide (LASIX) 20 MG tablet Take 1 tablet (20 mg total) by mouth daily. 30 tablet 0   gabapentin (NEURONTIN) 100 MG capsule Take 1 capsule (100 mg total) by mouth 2 (two) times daily. 60 capsule 0   gabapentin (NEURONTIN) 300 MG capsule Take 1 capsule (300 mg total) by mouth 3 (three) times daily. 90 capsule 0   hydrocortisone cream 0.5 % Apply topically 2 (two) times daily. 30 g 0   hydrOXYzine (ATARAX/VISTARIL) 10 MG tablet Take 1 tablet (10 mg total) by mouth 3 (three) times daily as needed for anxiety, nausea or vomiting. 75 tablet 0   insulin aspart (NOVOLOG) 100 UNIT/ML FlexPen Inject 4 Units into the skin 3 (three) times daily with meals. 15 mL 1   Insulin Glargine (BASAGLAR KWIKPEN) 100 UNIT/ML Inject 15 Units into the skin daily. 15 mL 1   Insulin Pen Needle (PEN NEEDLES) 32G X 6 MM MISC 1 application by Does not apply route 2 (two) times daily. 100 each 1   iron polysaccharides (NIFEREX) 150 MG capsule Take 1 capsule (150 mg total) by mouth daily. 30 capsule 0   losartan (COZAAR) 25 MG tablet Take 0.5 tablets (12.5 mg total) by mouth every morning. 30 tablet 0   melatonin 3 MG TABS tablet Take 1 tablet (3 mg total) by mouth at bedtime. 30 tablet 0   methocarbamol (ROBAXIN) 500 MG tablet Take 1 tablet (500 mg total) by mouth every 6 (six) hours as needed for muscle spasms. 120 tablet 0   methocarbamol (ROBAXIN) 500 MG tablet Take 1 tablet (500 mg total) by mouth 4 (four) times daily.     metoprolol succinate  (TOPROL-XL) 25 MG 24 hr tablet Take 1 tablet (25 mg total) by mouth daily. 30 tablet 0   Multiple Vitamin (MULTIVITAMIN WITH MINERALS) TABS tablet Take 1 tablet by mouth daily.     nutrition supplement, JUVEN, (JUVEN) PACK Take 1 packet by mouth 3 (three) times daily between meals. 90 packet 0   ondansetron (ZOFRAN) 4 MG tablet Take 1 tablet (4 mg total) by mouth every 6 (six) hours as needed for nausea. 20 tablet 0   oxyCODONE (OXY IR/ROXICODONE) 5 MG immediate release tablet Take 1 tablet (5 mg total) by mouth every 12 (twelve) hours as needed for severe pain. 10 tablet 0   pantoprazole (PROTONIX) 40 MG tablet Take 1 tablet (40 mg total) by mouth 2 (two) times daily. 60 tablet 0   psyllium (  HYDROCIL/METAMUCIL) 95 % PACK Take 1 packet by mouth daily. 240 each    traMADol (ULTRAM) 50 MG tablet Take 1 tablet (50 mg total) by mouth every 6 (six) hours as needed for moderate pain (use for pain rated 4-6/10). 28 tablet 0   No facility-administered medications prior to visit.     Social History   Socioeconomic History   Marital status: Married    Spouse name: Neilan Rizzo   Number of children: 1   Years of education: Not on file   Highest education level: Not on file  Occupational History   Occupation: Postal delivery  Tobacco Use   Smoking status: Every Day    Packs/day: 2.00    Types: Cigarettes    Start date: 06/28/1995   Smokeless tobacco: Never  Vaping Use   Vaping Use: Never used  Substance and Sexual Activity   Alcohol use: Not Currently   Drug use: Never   Sexual activity: Not Currently  Other Topics Concern   Not on file  Social History Narrative   Not on file   Social Determinants of Health   Financial Resource Strain: Not on file  Food Insecurity: Not on file  Transportation Needs: Not on file  Physical Activity: Not on file  Stress: Not on file  Social Connections: Not on file  Intimate Partner Violence: Not on file      Review of Systems    Other ros  negative   Objective:    Wt 185 lb (83.9 kg)   BMI 25.80 kg/m  Nursing note and vital signs reviewed.  Physical Exam    General/constitutional: moderate distress, pleasant HEENT: Normocephalic, PER, Conj Clear, EOMI, Oropharynx clear Neck supple CV: rrr no mrg Lungs: clear to auscultation, normal respiratory effort Abd: Soft, Nontender Ext: no edema Skin: No Rash Neuro: proximal strength of bilateral LE limited by pain, but distal strength 5/5 foot flexion/extension intact symmetric MSK: back spine nontender; incision without dehiscence; no fluctuance/erythema   Central line presence: LUE picc site no erythema/tendenress/fluctuance/purulence    Labs: Lab Results  Component Value Date   WBC 5.2 02/14/2021   HGB 7.4 (L) 02/14/2021   HCT 22.9 (L) 02/14/2021   MCV 83.3 02/14/2021   PLT 440 (H) 72/02/4708   Last metabolic panel Lab Results  Component Value Date   GLUCOSE 240 (H) 02/20/2021   NA 125 (L) 02/20/2021   K 3.4 (L) 02/20/2021   CL 95 (L) 02/20/2021   CO2 24 02/20/2021   BUN 15 02/20/2021   CREATININE 0.49 (L) 02/20/2021   GFRNONAA >60 02/20/2021   CALCIUM 8.2 (L) 02/20/2021   PHOS 3.7 01/27/2021   PROT 5.8 (L) 01/27/2021   ALBUMIN 2.2 (L) 01/27/2021   ALBUMIN 2.2 (L) 01/27/2021   BILITOT 0.5 01/27/2021   ALKPHOS 87 01/27/2021   AST 11 (L) 01/27/2021   ALT 10 01/27/2021   ANIONGAP 6 02/20/2021    Hh lab core ([facility]) 9/8 cbc 5/10/456  Crp: 8/23    5 (<1) 8/17    7.6  7/30    7.2 7/14   31  Vanc trough: 9/8   16  Micro:  Serology:  Imaging: 7/13 mri thoracic lumbar Paraspinal and other soft tissues: Patient is status post left laminectomy at L3. Fluid tracks from the laminectomy site into a subcutaneous and deep soft tissue collection with peripheral enhancement measuring 4.7 x 5.0 x 1.7 cm. The ventral epidural collection extends inferiorly to the L2 level. Separate prominent ventral collection  is present posterior to L4  prominently right the left. This collection measures 2.9 x 1.3 cm on the sagittal images.   Disc levels:   In addition to the epidural collections, disc disease is again noted at L4-5 and L5-S1. Central foraminal narrowing are associated.   IMPRESSION: 1. T7 laminectomy subcutaneous peripherally enhancing fluid collection at this level as well. 2. Residual posterior epidural fluid collection in the thoracic spine with similar cephalo caudad extension from T5-6-T9 but less mass effect. 3. Progressive signal abnormality and enhancement throughout the T7 and T8 vertebral bodies in the T7-8 thoracic disc consistent with disc osteomyelitis. 4. Progressive paraspinous soft tissue enhancement at T3-4 through T9-10 without drainable collection. 5. Persistent extensive ventral epidural collection compatible with abscess. 6. Status post left laminectomy at L3 with peripherally enhancing fluid collection extending from the operative site into the subcutaneous and deep tissues as described. This is concerning abscess. 7. Fluid tracks from the laminectomy site into a subdural deep soft tissue collection measuring 4.7 x 5.0 x 1.7 cm. 8. Separate ventral epidural collection at L4-5 and L5-S1 consistent with epidural abscess. 9. Progressive signal abnormality and enhancement in the L3 and L4 vertebral bodies. 10. 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     7/17 tte  1. Left ventricular ejection fraction, by estimation, is 55 to 60%. The  left ventricle has normal function. The left ventricle has no regional  wall motion abnormalities. There is mild concentric left ventricular  hypertrophy.   2. Right ventricular systolic function is normal. The right ventricular  size is normal.   3. The mitral valve is grossly normal. Trivial mitral valve   regurgitation. No evidence of mitral stenosis.   4. The aortic valve is grossly normal. Aortic valve regurgitation is not  visualized. No aortic stenosis is present.   5. The inferior vena cava is normal in size with greater than 50%  respiratory variability, suggesting right atrial pressure of 3 mmHg.    7/19 tee No valvular vegetation   7/20 ct abd pelv with contrast Progressive erosive changes involving the L3 and L4 vertebral bodies in keeping with progressive changes of discitis osteomyelitis. No pathologic fracture. Increasing paraspinal inflammatory collections adjacent to L3-L5 bilaterally without discrete drainable fluid at this time.   Progressive anasarca with development of pleural effusions, mild ascites, and moderate diffuse subcutaneous body wall edema.   Aortic Atherosclerosis   Assessment & Plan:   Problem List Items Addressed This Visit   None    No orders of the defined types were placed in this encounter.    CC: Mrsa bacteremia Thoracolumbar epidural abscess/osteomyelitis (t5-s1)     Abx: 7/14-20; 7/21-c vanc  7/20-8/05 ceftaroline Previously daptomycin   ASSESSMENT: 55 yo male recent mrsa bacteremia/epidural abscess/OM and psoas abscess s/p open debridement 6/28 and IR drainage of left psoas discharged on daptomycin, now readmitted 7/13 for worsening back pain of a few days found to have recurrent mrsa bacteremia   7/13 & 7/15 & 7/17 & 7/19 & 7/20 bcx mrsa  7/22, 24, 26 bcx negative   7/13 mri lumbar/thoracic showed several areas of fluid collection with a more loculated focus at L4-5. There is no focal LE neurologic deficit or urinary/bowel incontinence. NSG reviewed case and felt no urgent decompression at this time   7/16 IR reviewed case and percutaneously drained the 5 cm L3 level subdural  fluid collection.   7/17 tte no vegetation 7/19 tee no vegetation 7/20 abd/pelv ct with contrast no occult abscess   Wbc improving on vanc, but  persistent bacteremia switched to combination abx therapy 7/20 dapto/ceftaroline --> vanc/ceftaroline on 7/21 (dapto resistant on sensitivity testing 7/13)   Repeat mri no drainable discrete abscess. Discussed with dr Dawley of nsg on 7/21.    As blood cx sterilized on combination therapy, switched back to monotherapy vancomycin on 8/05 with plan to do at least 6 weeks  03/03/21 assessment Significant pain but no other neurological deficit LE otherwise Given significant pain will refer to pain clinic - advise patient to discuss this with his nsg as well Will need repeat mri thoracic and lumbar spine to reassess the epidural abscess Will likely need prolonged oral doxycycline (R bactrim), given significant abscess and prolonged bacteremia and osseous involvement Vanc trough 9/8 is 16   04/01/21 assessment Pain remains severe On appropriate dosing of vanc   PLAN: Will repeat ct scan (he asked for somewhere near [place] area) of lumbar-thoracic spine in around 2 weeks Continue vancomycin for now Please follow with neurosurgery (discussed with Dr Dawley who agreesa brace for his back might help with comfort) Follow up with me in 3-4 weeks [facility] to get [facility] cbc, cmp, crp, and vanc trough while on iv abx F/u pain management     Follow-up: Return in about 4 weeks (around 04/29/2021).  ----------- Addendum Labs received post visit [facility] 10/02 esr 61, crp 6.8 (<5), vanc trough 16.8; cbc 6/12/404; cr 0.53; lft 14/11/112/0.4  09/26 crp 25.7 (<5)    Jabier Mutton, MD [facility] [place] [phone]  pager   [phone] cell 04/01/2021, 8:54 AM

## 2021-04-01 NOTE — Telephone Encounter (Signed)
Dorie, I emailed [facility] with the orders. He hasn't responded to any of my emails today at this time already, but hopefully the message is relayed. Thanks! We can follow up next week to make sure all is well.

## 2021-04-01 NOTE — Telephone Encounter (Signed)
Thank you! Their phones were down yesterday... I guess they aren't working yet.

## 2021-04-01 NOTE — Patient Instructions (Signed)
Good to see you today   Let's get repeat imaging of your spine within the next few weeks. Will see if we can schedule it near [place]   Continue the IV vancomycin. Depending on what the imaging looks like, hopefully we can transition you to oral antibiotics   Make a video visit with me in around 4 weeks  Continue to see dr Dawley and pain clinic

## 2021-04-01 NOTE — Telephone Encounter (Signed)
Unable to reach [facility] to relay verbal orders for patient. Will fax new orders and include ID pharmacy team. Message sent to Jeri Modena however unsure if was received.   Patient to continue IV abx an additional 4 weeks; CRP, CBC, CMP and Vanc trough to be drawn weekly per Dr. Renold Don.   Rosanna Randy, RN

## 2021-04-02 DIAGNOSIS — R7881 Bacteremia: Secondary | ICD-10-CM | POA: Diagnosis not present

## 2021-04-03 DIAGNOSIS — E785 Hyperlipidemia, unspecified: Secondary | ICD-10-CM | POA: Diagnosis not present

## 2021-04-03 DIAGNOSIS — I110 Hypertensive heart disease with heart failure: Secondary | ICD-10-CM | POA: Diagnosis not present

## 2021-04-03 DIAGNOSIS — E871 Hypo-osmolality and hyponatremia: Secondary | ICD-10-CM | POA: Diagnosis not present

## 2021-04-03 DIAGNOSIS — R7881 Bacteremia: Secondary | ICD-10-CM | POA: Diagnosis not present

## 2021-04-03 DIAGNOSIS — M9963 Osseous and subluxation stenosis of intervertebral foramina of lumbar region: Secondary | ICD-10-CM | POA: Diagnosis not present

## 2021-04-03 DIAGNOSIS — G062 Extradural and subdural abscess, unspecified: Secondary | ICD-10-CM | POA: Diagnosis not present

## 2021-04-03 DIAGNOSIS — D75839 Thrombocytosis, unspecified: Secondary | ICD-10-CM | POA: Diagnosis not present

## 2021-04-03 DIAGNOSIS — Z7982 Long term (current) use of aspirin: Secondary | ICD-10-CM | POA: Diagnosis not present

## 2021-04-03 DIAGNOSIS — I2510 Atherosclerotic heart disease of native coronary artery without angina pectoris: Secondary | ICD-10-CM | POA: Diagnosis not present

## 2021-04-03 DIAGNOSIS — D649 Anemia, unspecified: Secondary | ICD-10-CM | POA: Diagnosis not present

## 2021-04-03 DIAGNOSIS — Z7984 Long term (current) use of oral hypoglycemic drugs: Secondary | ICD-10-CM | POA: Diagnosis not present

## 2021-04-03 DIAGNOSIS — I5032 Chronic diastolic (congestive) heart failure: Secondary | ICD-10-CM | POA: Diagnosis not present

## 2021-04-03 DIAGNOSIS — A419 Sepsis, unspecified organism: Secondary | ICD-10-CM | POA: Diagnosis not present

## 2021-04-03 DIAGNOSIS — Z794 Long term (current) use of insulin: Secondary | ICD-10-CM | POA: Diagnosis not present

## 2021-04-03 DIAGNOSIS — Z792 Long term (current) use of antibiotics: Secondary | ICD-10-CM | POA: Diagnosis not present

## 2021-04-03 DIAGNOSIS — E119 Type 2 diabetes mellitus without complications: Secondary | ICD-10-CM | POA: Diagnosis not present

## 2021-04-03 DIAGNOSIS — F17210 Nicotine dependence, cigarettes, uncomplicated: Secondary | ICD-10-CM | POA: Diagnosis not present

## 2021-04-05 ENCOUNTER — Encounter: Payer: Federal, State, Local not specified - PPO | Attending: Registered Nurse | Admitting: Registered Nurse

## 2021-04-05 DIAGNOSIS — R7881 Bacteremia: Secondary | ICD-10-CM | POA: Diagnosis not present

## 2021-04-06 DIAGNOSIS — R7881 Bacteremia: Secondary | ICD-10-CM | POA: Diagnosis not present

## 2021-04-07 DIAGNOSIS — R7881 Bacteremia: Secondary | ICD-10-CM | POA: Diagnosis not present

## 2021-04-07 NOTE — Telephone Encounter (Signed)
[facility] in [place] called for verbal orders as they were unaware that abx were extended. RN gave verbal orders to pharmacist (continue iv abx thru 11/8 appointment with Dr. Renold Don and weekly labs as previously mentioned). [place] office is managing patients medication and home health so orders need to be given to them.   Phone: [phone]  Rosanna Randy, RN

## 2021-04-08 DIAGNOSIS — R7881 Bacteremia: Secondary | ICD-10-CM | POA: Diagnosis not present

## 2021-04-09 DIAGNOSIS — R7881 Bacteremia: Secondary | ICD-10-CM | POA: Diagnosis not present

## 2021-04-11 ENCOUNTER — Telehealth: Payer: Self-pay | Admitting: Pharmacist

## 2021-04-11 DIAGNOSIS — I5032 Chronic diastolic (congestive) heart failure: Secondary | ICD-10-CM | POA: Diagnosis not present

## 2021-04-11 DIAGNOSIS — Z792 Long term (current) use of antibiotics: Secondary | ICD-10-CM | POA: Diagnosis not present

## 2021-04-11 DIAGNOSIS — Z7982 Long term (current) use of aspirin: Secondary | ICD-10-CM | POA: Diagnosis not present

## 2021-04-11 DIAGNOSIS — I2510 Atherosclerotic heart disease of native coronary artery without angina pectoris: Secondary | ICD-10-CM | POA: Diagnosis not present

## 2021-04-11 DIAGNOSIS — F17210 Nicotine dependence, cigarettes, uncomplicated: Secondary | ICD-10-CM | POA: Diagnosis not present

## 2021-04-11 DIAGNOSIS — G062 Extradural and subdural abscess, unspecified: Secondary | ICD-10-CM | POA: Diagnosis not present

## 2021-04-11 DIAGNOSIS — I110 Hypertensive heart disease with heart failure: Secondary | ICD-10-CM | POA: Diagnosis not present

## 2021-04-11 DIAGNOSIS — D649 Anemia, unspecified: Secondary | ICD-10-CM | POA: Diagnosis not present

## 2021-04-11 DIAGNOSIS — D75839 Thrombocytosis, unspecified: Secondary | ICD-10-CM | POA: Diagnosis not present

## 2021-04-11 DIAGNOSIS — Z7984 Long term (current) use of oral hypoglycemic drugs: Secondary | ICD-10-CM | POA: Diagnosis not present

## 2021-04-11 DIAGNOSIS — A419 Sepsis, unspecified organism: Secondary | ICD-10-CM | POA: Diagnosis not present

## 2021-04-11 DIAGNOSIS — E119 Type 2 diabetes mellitus without complications: Secondary | ICD-10-CM | POA: Diagnosis not present

## 2021-04-11 DIAGNOSIS — R7881 Bacteremia: Secondary | ICD-10-CM | POA: Diagnosis not present

## 2021-04-11 DIAGNOSIS — E785 Hyperlipidemia, unspecified: Secondary | ICD-10-CM | POA: Diagnosis not present

## 2021-04-11 DIAGNOSIS — E871 Hypo-osmolality and hyponatremia: Secondary | ICD-10-CM | POA: Diagnosis not present

## 2021-04-11 DIAGNOSIS — M9963 Osseous and subluxation stenosis of intervertebral foramina of lumbar region: Secondary | ICD-10-CM | POA: Diagnosis not present

## 2021-04-11 DIAGNOSIS — Z794 Long term (current) use of insulin: Secondary | ICD-10-CM | POA: Diagnosis not present

## 2021-04-11 NOTE — Telephone Encounter (Signed)
Louis [name], nurse from [facility], called regarding patient's cathflo. Patient has been holding IV vancomycin since Thursday in anticipation for CT scan tomorrow to decide next steps. Patient and wife have to pay $300 out of pocket for cathflo. Nurse is asking if we can hold today's cathflo as nothing is being infused, and we may pull PICC depending on CT scan results. I gave verbal order to hold cathflo today until we get CT scan results back and see if patient will continue IV vancomycin. Nurse is going out today to draw labs - CBC, CMP, ESR, and CRP.   Louis [name], PharmD [facility] Clinical Pharmacist Practitioner

## 2021-04-11 NOTE — Telephone Encounter (Signed)
Thank you Cassie! 

## 2021-04-12 ENCOUNTER — Other Ambulatory Visit: Payer: Self-pay | Admitting: Physical Medicine and Rehabilitation

## 2021-04-12 ENCOUNTER — Telehealth: Payer: Self-pay

## 2021-04-12 DIAGNOSIS — R7881 Bacteremia: Secondary | ICD-10-CM | POA: Diagnosis not present

## 2021-04-12 NOTE — Telephone Encounter (Signed)
Patient's wife called office today following up on discussion from yesterday on antibiotic plan. Patient is not scheduled for CT until 10/25. Would like to know if they should continue oral antibiotics or go forward with Cath flow. Spoke with Marchelle Folks, Pharmacist who advised to continue with oral antbx as planned.  No questions at this time. Juanita Laster, RMA

## 2021-04-13 DIAGNOSIS — R7881 Bacteremia: Secondary | ICD-10-CM | POA: Diagnosis not present

## 2021-04-13 NOTE — Telephone Encounter (Signed)
Thank you all 

## 2021-04-14 DIAGNOSIS — R7881 Bacteremia: Secondary | ICD-10-CM | POA: Diagnosis not present

## 2021-04-15 DIAGNOSIS — R7881 Bacteremia: Secondary | ICD-10-CM | POA: Diagnosis not present

## 2021-04-18 DIAGNOSIS — I2510 Atherosclerotic heart disease of native coronary artery without angina pectoris: Secondary | ICD-10-CM | POA: Diagnosis not present

## 2021-04-18 DIAGNOSIS — Z792 Long term (current) use of antibiotics: Secondary | ICD-10-CM | POA: Diagnosis not present

## 2021-04-18 DIAGNOSIS — Z794 Long term (current) use of insulin: Secondary | ICD-10-CM | POA: Diagnosis not present

## 2021-04-18 DIAGNOSIS — Z7982 Long term (current) use of aspirin: Secondary | ICD-10-CM | POA: Diagnosis not present

## 2021-04-18 DIAGNOSIS — E119 Type 2 diabetes mellitus without complications: Secondary | ICD-10-CM | POA: Diagnosis not present

## 2021-04-18 DIAGNOSIS — Z7984 Long term (current) use of oral hypoglycemic drugs: Secondary | ICD-10-CM | POA: Diagnosis not present

## 2021-04-18 DIAGNOSIS — I110 Hypertensive heart disease with heart failure: Secondary | ICD-10-CM | POA: Diagnosis not present

## 2021-04-18 DIAGNOSIS — A419 Sepsis, unspecified organism: Secondary | ICD-10-CM | POA: Diagnosis not present

## 2021-04-18 DIAGNOSIS — D649 Anemia, unspecified: Secondary | ICD-10-CM | POA: Diagnosis not present

## 2021-04-18 DIAGNOSIS — F17210 Nicotine dependence, cigarettes, uncomplicated: Secondary | ICD-10-CM | POA: Diagnosis not present

## 2021-04-18 DIAGNOSIS — E871 Hypo-osmolality and hyponatremia: Secondary | ICD-10-CM | POA: Diagnosis not present

## 2021-04-18 DIAGNOSIS — I5032 Chronic diastolic (congestive) heart failure: Secondary | ICD-10-CM | POA: Diagnosis not present

## 2021-04-18 DIAGNOSIS — M9963 Osseous and subluxation stenosis of intervertebral foramina of lumbar region: Secondary | ICD-10-CM | POA: Diagnosis not present

## 2021-04-18 DIAGNOSIS — D75839 Thrombocytosis, unspecified: Secondary | ICD-10-CM | POA: Diagnosis not present

## 2021-04-18 DIAGNOSIS — E785 Hyperlipidemia, unspecified: Secondary | ICD-10-CM | POA: Diagnosis not present

## 2021-04-18 DIAGNOSIS — G062 Extradural and subdural abscess, unspecified: Secondary | ICD-10-CM | POA: Diagnosis not present

## 2021-04-19 ENCOUNTER — Encounter: Payer: Self-pay | Admitting: Internal Medicine

## 2021-04-19 DIAGNOSIS — G062 Extradural and subdural abscess, unspecified: Secondary | ICD-10-CM | POA: Diagnosis not present

## 2021-04-19 DIAGNOSIS — M4624 Osteomyelitis of vertebra, thoracic region: Secondary | ICD-10-CM | POA: Diagnosis not present

## 2021-04-19 DIAGNOSIS — L03312 Cellulitis of back [any part except buttock]: Secondary | ICD-10-CM | POA: Diagnosis not present

## 2021-04-19 DIAGNOSIS — M60009 Infective myositis, unspecified site: Secondary | ICD-10-CM | POA: Diagnosis not present

## 2021-04-19 DIAGNOSIS — M47814 Spondylosis without myelopathy or radiculopathy, thoracic region: Secondary | ICD-10-CM | POA: Diagnosis not present

## 2021-04-19 DIAGNOSIS — M4646 Discitis, unspecified, lumbar region: Secondary | ICD-10-CM | POA: Diagnosis not present

## 2021-04-19 DIAGNOSIS — M47816 Spondylosis without myelopathy or radiculopathy, lumbar region: Secondary | ICD-10-CM | POA: Diagnosis not present

## 2021-04-19 DIAGNOSIS — M4620 Osteomyelitis of vertebra, site unspecified: Secondary | ICD-10-CM | POA: Diagnosis not present

## 2021-04-19 DIAGNOSIS — R600 Localized edema: Secondary | ICD-10-CM | POA: Diagnosis not present

## 2021-04-20 ENCOUNTER — Telehealth: Payer: Self-pay

## 2021-04-20 NOTE — Telephone Encounter (Signed)
Kerby Less with [facility]: [phone]

## 2021-04-20 NOTE — Telephone Encounter (Signed)
Received call from [place], nurse with [facility] wanting to know if patient can be discharged or if antibiotics need to be extended. She says patient went for CT scans yesterday. Margaret to call [facility] and request results be faxed to our office for provider to review and make decision.   Sandie Ano, RN

## 2021-04-20 NOTE — Telephone Encounter (Addendum)
Relayed verbal orders per Dr. Renold Don to [place] with [facility] that okay to pull PICC.   Called patient to discuss change in care plan, no answer and no voicemail set up.   Per Dr. Renold Don patient can have PICC line pulled and start doxycycline 100 mg BID x 30 days with one refill.   Dr. Renold Don has previously sent in doxy, will let patient know to pick it up from the pharmacy. If prescription has expired will need to resend.   Sandie Ano, RN

## 2021-04-20 NOTE — Telephone Encounter (Signed)
Per Claris Che, scan has not yet resulted. [facility] to fax to our office once results are available.   Sandie Ano, RN

## 2021-04-21 DIAGNOSIS — Z7984 Long term (current) use of oral hypoglycemic drugs: Secondary | ICD-10-CM | POA: Diagnosis not present

## 2021-04-21 DIAGNOSIS — I2510 Atherosclerotic heart disease of native coronary artery without angina pectoris: Secondary | ICD-10-CM | POA: Diagnosis not present

## 2021-04-21 DIAGNOSIS — G062 Extradural and subdural abscess, unspecified: Secondary | ICD-10-CM | POA: Diagnosis not present

## 2021-04-21 DIAGNOSIS — Z794 Long term (current) use of insulin: Secondary | ICD-10-CM | POA: Diagnosis not present

## 2021-04-21 DIAGNOSIS — E119 Type 2 diabetes mellitus without complications: Secondary | ICD-10-CM | POA: Diagnosis not present

## 2021-04-21 DIAGNOSIS — E871 Hypo-osmolality and hyponatremia: Secondary | ICD-10-CM | POA: Diagnosis not present

## 2021-04-21 DIAGNOSIS — I110 Hypertensive heart disease with heart failure: Secondary | ICD-10-CM | POA: Diagnosis not present

## 2021-04-21 DIAGNOSIS — E785 Hyperlipidemia, unspecified: Secondary | ICD-10-CM | POA: Diagnosis not present

## 2021-04-21 DIAGNOSIS — I5032 Chronic diastolic (congestive) heart failure: Secondary | ICD-10-CM | POA: Diagnosis not present

## 2021-04-21 DIAGNOSIS — A419 Sepsis, unspecified organism: Secondary | ICD-10-CM | POA: Diagnosis not present

## 2021-04-21 DIAGNOSIS — F17210 Nicotine dependence, cigarettes, uncomplicated: Secondary | ICD-10-CM | POA: Diagnosis not present

## 2021-04-21 DIAGNOSIS — Z7982 Long term (current) use of aspirin: Secondary | ICD-10-CM | POA: Diagnosis not present

## 2021-04-21 DIAGNOSIS — D75839 Thrombocytosis, unspecified: Secondary | ICD-10-CM | POA: Diagnosis not present

## 2021-04-21 DIAGNOSIS — D649 Anemia, unspecified: Secondary | ICD-10-CM | POA: Diagnosis not present

## 2021-04-21 DIAGNOSIS — M9963 Osseous and subluxation stenosis of intervertebral foramina of lumbar region: Secondary | ICD-10-CM | POA: Diagnosis not present

## 2021-05-01 DIAGNOSIS — M4626 Osteomyelitis of vertebra, lumbar region: Secondary | ICD-10-CM | POA: Diagnosis not present

## 2021-05-01 DIAGNOSIS — R2689 Other abnormalities of gait and mobility: Secondary | ICD-10-CM | POA: Diagnosis not present

## 2021-05-03 ENCOUNTER — Telehealth: Payer: Self-pay

## 2021-05-03 ENCOUNTER — Ambulatory Visit (INDEPENDENT_AMBULATORY_CARE_PROVIDER_SITE_OTHER): Payer: Federal, State, Local not specified - PPO | Admitting: Internal Medicine

## 2021-05-03 ENCOUNTER — Other Ambulatory Visit: Payer: Self-pay

## 2021-05-03 DIAGNOSIS — M6008 Infective myositis, other site: Secondary | ICD-10-CM | POA: Diagnosis not present

## 2021-05-03 DIAGNOSIS — G062 Extradural and subdural abscess, unspecified: Secondary | ICD-10-CM | POA: Diagnosis not present

## 2021-05-03 NOTE — Telephone Encounter (Signed)
Patient is needing labs (CRP, CBC and CMP)per Dr. Renold Don. Patient requested to get labs drawn at his pcp office. I have spoke with their office and they are ok with drawing patient's labs there and faxing the results to our office. I  faxed lab order to [facility] in [place] [phone]). Patient advised orders have been faxed and to call once he has had the labs drawn.  Theodosia Bahena T Pricilla Loveless

## 2021-05-03 NOTE — Progress Notes (Signed)
[facility]  Patient Active Problem List   Diagnosis Date Noted   SIADH (syndrome of inappropriate ADH production) ([place]) 02/23/2021   Chronic diastolic congestive heart failure ([place])    Uncontrolled type 2 diabetes mellitus with hyperglycemia ([place])    Acute midline low back pain without sciatica    Pressure injury of skin 01/30/2021   Debility 01/29/2021   Protein-calorie malnutrition, severe 01/13/2021   Discitis 01/13/2021   Abnormal EKG 01/13/2021   Thrombocytosis 01/13/2021   Hypomagnesemia 01/13/2021   Hypokalemia 01/13/2021   Oral thrush 01/13/2021   GERD (gastroesophageal reflux disease) 01/13/2021   Cholestasis 01/13/2021   Normocytic anemia 01/06/2021   Hyponatremia 01/06/2021   Anxiety 01/06/2021   Chronic diastolic CHF (congestive heart failure) ([place]) 01/06/2021   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   MRN: [id number]  No chief complaint on file. Cc -- f/u mrsa bsi and thoracolumbar epidural abscess/OM  HPI:  Louis [name] is a 55 y.o. male with dm2 here for f/o of above issues  He was discharged from hospital 8/28 He was initially admitted late 11/2020 for mrsa bacteremia with vertebral om. Course complicated by worsening vertebral epidural abscess s/p I&D 6/28. He was discharged on daptomycin but returned after a few days on 7/13 with  recurrent mrsa bacteremia, which persisted for a week. Daptomycin found to be resistant  He was switched to combination vanc/ceftaroline with sterilization of bsi. His repeat mri doesn't show any focal epidural abscess to debride. Abd/chest ct showed no other occult abscess and tee showed no endocarditis  He was switched to mono vanc after 2 weeks combo abx on 8/05.    --------- 9/08 id clinic f/u Patient remains in significant back pain with radiating sx to his legs. This is the same as during recent hospital admission, maybe a touch better He denies urinary/bowel incontinence He has no focal weakness in LE otherwise He is taking oxycodone 5 mg bid and gabapentin and tramadol prn for pain at a time No constipation/diarrhea No rash No f/c   10/07 id clinic f/u Patient saw dr Dawley after my last visit. Nothing special is planned. Dr Reatha Armour will graciously continue to follow and review any back mri/imaging ordered Patient reports sweating at night drenching since the beginning of this infection No actual fever No rash No diarrhea He remains on vancomycin with HH  I still do not have labs at all in [facility]/from opat team (who is in our clinic and receives any faxed labs)/epic.  Pain is still severe. He'll be seeing the pain clinic next week. He currently takes oxycodone three times a done, gabapentin also three times a day, and tramadol, and preivously mobic  His PCP is at [place], [place] where he sees often for medical care  11/08 id clinic f/u This is a televideo visit at his request as his back pain is moderate-severe and  he lives 2 hours away I verified that I was speaking with the correct person using two identifiers.  The patient was located at home. The provider was located in the office. The patient did consent to this visit and is aware of charges through their insurance as well as the limitations of evaluation and management by telemedicine. Other persons participating in  this telemedicine service include his wife  We reviewed ct of his lumbar spine again He transitioned from IV vancomycin to doxycycline on 10/26. Tolerating oral abx His functional status and back pain remains same No n/v/diarrhea/rash No f/c No numbness/tingling in bilateral LE or incontinence He'll see dr Dawley tomorrow for nsg follow up. I advised him to discuss potential back brace for comfort and PT/OT He walks with FWW and mostly confined to the house. He doesn't appear depressed or frustrated  No Known Allergies    Outpatient Medications Prior to Visit  Medication Sig Dispense Refill   acetaminophen (TYLENOL) 325 MG tablet Take 1-2 tablets (325-650 mg total) by mouth every 4 (four) hours as needed for mild pain.     amLODipine (NORVASC) 10 MG tablet Take 1 tablet (10 mg total) by mouth daily. 30 tablet 0   aspirin EC 81 MG tablet Take 81 mg by mouth daily. Swallow whole.     blood glucose meter kit and supplies KIT Dispense based on patient and insurance preference. Use up to four times daily as directed. 1 each 0   diclofenac Sodium (VOLTAREN) 1 % GEL Apply 2 g topically 4 (four) times daily as needed (pain). 350 g 0   folic acid (FOLVITE) 1 MG tablet Take 1 tablet (1 mg total) by mouth daily. 30 tablet 0   furosemide (LASIX) 20 MG tablet Take 1 tablet (20 mg total) by mouth daily. 30 tablet 0   gabapentin (NEURONTIN) 100 MG capsule Take 1 capsule (100 mg total) by mouth 2 (two) times daily. 60 capsule 0   gabapentin (NEURONTIN) 300 MG capsule Take 1 capsule (300 mg total) by mouth 3 (three) times daily. 90 capsule 0   hydrocortisone cream 0.5 % Apply topically 2 (two) times daily. 30 g 0   hydrOXYzine (ATARAX/VISTARIL) 10 MG tablet Take 1 tablet (10 mg total) by mouth 3 (three) times daily as needed for anxiety, nausea or vomiting. 75 tablet 0   insulin aspart (NOVOLOG) 100 UNIT/ML FlexPen Inject 4 Units into the skin 3 (three) times daily with meals. 15 mL 1   Insulin Glargine  (BASAGLAR KWIKPEN) 100 UNIT/ML Inject 15 Units into the skin daily. 15 mL 1   Insulin Pen Needle (PEN NEEDLES) 32G X 6 MM MISC 1 application by Does not apply route 2 (two) times daily. 100 each 1   iron polysaccharides (NIFEREX) 150 MG capsule Take 1 capsule (150 mg total) by mouth daily. 30 capsule 0   losartan (COZAAR) 25 MG tablet Take 0.5 tablets (12.5 mg total) by mouth every morning. 30 tablet 0   melatonin 3 MG TABS tablet Take 1 tablet (3 mg total) by mouth at bedtime. 30 tablet 0   methocarbamol (ROBAXIN) 500 MG tablet Take 1 tablet (500 mg total) by mouth every 6 (six) hours as needed for muscle spasms. 120 tablet 0   methocarbamol (ROBAXIN) 500 MG tablet Take 1 tablet (500 mg total) by mouth 4 (four) times daily.     metoprolol succinate (TOPROL-XL) 25 MG 24 hr tablet Take 1 tablet (25 mg total) by mouth daily. 30 tablet 0  Multiple Vitamin (MULTIVITAMIN WITH MINERALS) TABS tablet Take 1 tablet by mouth daily.     nutrition supplement, JUVEN, (JUVEN) PACK Take 1 packet by mouth 3 (three) times daily between meals. 90 packet 0   ondansetron (ZOFRAN) 4 MG tablet Take 1 tablet (4 mg total) by mouth every 6 (six) hours as needed for nausea. 20 tablet 0   oxyCODONE (OXY IR/ROXICODONE) 5 MG immediate release tablet Take 1 tablet (5 mg total) by mouth every 12 (twelve) hours as needed for severe pain. 10 tablet 0   pantoprazole (PROTONIX) 40 MG tablet Take 1 tablet (40 mg total) by mouth 2 (two) times daily. 60 tablet 0   psyllium (HYDROCIL/METAMUCIL) 95 % PACK Take 1 packet by mouth daily. 240 each    traMADol (ULTRAM) 50 MG tablet Take 1 tablet (50 mg total) by mouth every 6 (six) hours as needed for moderate pain (use for pain rated 4-6/10). 28 tablet 0   No facility-administered medications prior to visit.     Social History   Socioeconomic History   Marital status: Married    Spouse name: Kris No   Number of children: 1   Years of education: Not on file   Highest education  level: Not on file  Occupational History   Occupation: Postal delivery  Tobacco Use   Smoking status: Every Day    Packs/day: 2.00    Types: Cigarettes    Start date: 06/28/1995   Smokeless tobacco: Never  Vaping Use   Vaping Use: Never used  Substance and Sexual Activity   Alcohol use: Not Currently   Drug use: Never   Sexual activity: Not Currently  Other Topics Concern   Not on file  Social History Narrative   Not on file   Social Determinants of Health   Financial Resource Strain: Not on file  Food Insecurity: Not on file  Transportation Needs: Not on file  Physical Activity: Not on file  Stress: Not on file  Social Connections: Not on file  Intimate Partner Violence: Not on file      Review of Systems    Other ros negative   Objective:    There were no vitals taken for this visit. Nursing note and vital signs reviewed.  Physical Exam  General: no distress; fully conversant; pleasant Heent: normocephalic; eomi; conj clear Pulm: normal respiratory effort Neuro: walks with FWW; standup from sitting with help of upper extremities assist  This is a televideo visit   Labs: Lab Results  Component Value Date   WBC 5.2 02/14/2021   HGB 7.4 (L) 02/14/2021   HCT 22.9 (L) 02/14/2021   MCV 83.3 02/14/2021   PLT 440 (H) 89/16/9450   Last metabolic panel Lab Results  Component Value Date   GLUCOSE 240 (H) 02/20/2021   NA 125 (L) 02/20/2021   K 3.4 (L) 02/20/2021   CL 95 (L) 02/20/2021   CO2 24 02/20/2021   BUN 15 02/20/2021   CREATININE 0.49 (L) 02/20/2021   GFRNONAA >60 02/20/2021   CALCIUM 8.2 (L) 02/20/2021   PHOS 3.7 01/27/2021   PROT 5.8 (L) 01/27/2021   ALBUMIN 2.2 (L) 01/27/2021   ALBUMIN 2.2 (L) 01/27/2021   BILITOT 0.5 01/27/2021   ALKPHOS 87 01/27/2021   AST 11 (L) 01/27/2021   ALT 10 01/27/2021   ANIONGAP 6 02/20/2021    [facility] lab core ([facility]) 10/09 cbc 6/11/382; cr 0.5; lft 16/13/99/0.5 10/02 esr 61, crp 6.8 (<5), vanc  trough 16.8; cbc 6/12/404;  cr 0.53; lft 14/11/112/0.4  09/26 crp 25.7 (<5) 9/8 cbc 5/10/456  Crp: 10/09 32.7 (<5) 9/26  25.7 (<5) 8/23    5 (<1) 8/17    7.6  7/30    7.2 7/14   31  Vanc trough: 9/8   16  Micro:  Serology:  Imaging: 10/24 ct thoracic lumbar spine (outside facility) 1) endplate erosive change and heterogeneous sclerosis/lucency at t7-t3, l3-l4 and l4-l5 compatible with known discitis osteomyelitis. No progressive vertebral body height loss. Surrounding paraspinal cellulitis/phlegmon at these levels. Comparision limited due to different modalities with prior mri  2) limited assessment of canal by ct, however, suggestion ofsoft tissue thickening within ventral canal l4-l5 concerning for residual epidural collection  3) multilevel superimposed degenerative change in thoracic/lumbar spine  9/12 mr lumbar thoracic spine 1. Persistent findings of discitis/osteomyelitis at T7, T8, and L3 through L5. 2. Persistent epidural collection in the thoracolumbar spine concerning for residual epidural abscess which appears slightly increased in size since the prior study. Specifically, canal patency at L3-L4 and L4-L5 has worsened, with severe canal stenosis and compression of the cauda equina nerve roots. 3. Moderate spinal canal stenosis without evidence of cord compression at T7-T8 due to the persistent epidural collection is overall not significantly changed. 4. Persistent left pleural effusion and resolved right pleural effusion. 5. Persistent edema throughout the paraspinal and psoas muscles with resolved psoas abscesses.  7/19 tee No valvular vegetation   7/20 ct abd pelv with contrast Progressive erosive changes involving the L3 and L4 vertebral bodies in keeping with progressive changes of discitis osteomyelitis. No pathologic fracture. Increasing paraspinal inflammatory collections adjacent to L3-L5 bilaterally without discrete drainable fluid at this time.    Progressive anasarca with development of pleural effusions, mild ascites, and moderate diffuse subcutaneous body wall edema.   Aortic Atherosclerosis   Assessment & Plan:   Problem List Items Addressed This Visit       Nervous and Auditory   Epidural abscess - Primary   Other Visit Diagnoses     Abscess of paraspinal muscles            No orders of the defined types were placed in this encounter.    CC: Mrsa bacteremia Thoracolumbar epidural abscess/osteomyelitis (t5-s1) paraspinal myositis Psoas abscess (resolved on 9/12 mr thoracolumbar spine)   Abx: 7/14-20; 7/21-c vanc  7/20-8/05 ceftaroline Previously daptomycin   ASSESSMENT: 55 yo male recent mrsa bacteremia/epidural abscess/OM and psoas abscess s/p open debridement 6/28 and IR drainage of left psoas discharged on daptomycin, now readmitted 7/13 for worsening back pain of a few days found to have recurrent mrsa bacteremia   7/13 & 7/15 & 7/17 & 7/19 & 7/20 bcx mrsa  7/22, 24, 26 bcx negative   7/13 mri lumbar/thoracic showed several areas of fluid collection with a more loculated focus at L4-5. There is no focal LE neurologic deficit or urinary/bowel incontinence. NSG reviewed case and felt no urgent decompression at this time   7/16 IR reviewed case and percutaneously drained the 5 cm L3 level subdural fluid collection.   7/17 tte no vegetation 7/19 tee no vegetation 7/20 abd/pelv ct with contrast no occult abscess   Wbc improving on vanc, but persistent bacteremia switched to combination abx therapy 7/20 dapto/ceftaroline --> vanc/ceftaroline on 7/21 (dapto resistant on sensitivity testing 7/13)   Repeat mri no drainable discrete abscess. Discussed with dr Dawley of nsg on 7/21.    As blood cx sterilized on combination therapy, switched back to monotherapy vancomycin on  8/05 with plan to do at least 6 weeks  03/03/21 assessment Significant pain but no other neurological deficit LE otherwise Given  significant pain will refer to pain clinic - advise patient to discuss this with his nsg as well Will need repeat mri thoracic and lumbar spine to reassess the epidural abscess Will likely need prolonged oral doxycycline (R bactrim), given significant abscess and prolonged bacteremia and osseous involvement Vanc trough 9/8 is 16   04/01/21 assessment Pain remains severe On appropriate dosing of vanc  05/03/21 assessment From 10/24 ct still presence of extensive infectious process of his thoracolumbar spine/epidural space and paraspinal muscle with dynamic changes that doesn't suggest worsening of his disease. The caveat is crp is uptrending as well -- will need to follow up closely Transitioned from iv vancomycin to doxy on 10/26. Anticipate prolonged oral abx course   -labs order (faxed to his local clinic) cbc, cmp, crp -continue doxycycline -f/u neurosurgery; discussed wth him to ask about back brace and PT/ot initiation -- I am not sure when his pain will improve -f/u televideo visit in 4-6 weeks  I spent 25 minutes on mr [place] and talking with [facility] labs and review data/administrative work  Follow-up: Return in about 4 weeks (around 05/31/2021).     Jabier Mutton, [facility] [phone]  pager   [phone] cell 05/03/2021, 9:13 AM

## 2021-05-04 DIAGNOSIS — G061 Intraspinal abscess and granuloma: Secondary | ICD-10-CM | POA: Diagnosis not present

## 2021-05-05 DIAGNOSIS — M4646 Discitis, unspecified, lumbar region: Secondary | ICD-10-CM | POA: Diagnosis not present

## 2021-05-05 DIAGNOSIS — M4626 Osteomyelitis of vertebra, lumbar region: Secondary | ICD-10-CM | POA: Diagnosis not present

## 2021-05-05 DIAGNOSIS — G8929 Other chronic pain: Secondary | ICD-10-CM | POA: Diagnosis not present

## 2021-05-18 DIAGNOSIS — M4646 Discitis, unspecified, lumbar region: Secondary | ICD-10-CM | POA: Diagnosis not present

## 2021-05-18 DIAGNOSIS — M4626 Osteomyelitis of vertebra, lumbar region: Secondary | ICD-10-CM | POA: Diagnosis not present

## 2021-05-18 DIAGNOSIS — E1159 Type 2 diabetes mellitus with other circulatory complications: Secondary | ICD-10-CM | POA: Diagnosis not present

## 2021-05-18 DIAGNOSIS — G8929 Other chronic pain: Secondary | ICD-10-CM | POA: Diagnosis not present

## 2021-05-18 DIAGNOSIS — E118 Type 2 diabetes mellitus with unspecified complications: Secondary | ICD-10-CM | POA: Diagnosis not present

## 2021-05-23 ENCOUNTER — Encounter: Payer: Self-pay | Admitting: Internal Medicine

## 2021-05-26 ENCOUNTER — Encounter: Payer: Self-pay | Admitting: Internal Medicine

## 2021-05-26 ENCOUNTER — Telehealth: Payer: Self-pay | Admitting: Internal Medicine

## 2021-05-26 ENCOUNTER — Telehealth: Payer: Self-pay

## 2021-05-26 DIAGNOSIS — M4620 Osteomyelitis of vertebra, site unspecified: Secondary | ICD-10-CM

## 2021-05-26 DIAGNOSIS — G062 Extradural and subdural abscess, unspecified: Secondary | ICD-10-CM

## 2021-05-26 MED ORDER — DOXYCYCLINE HYCLATE 100 MG PO TABS
100.0000 mg | ORAL_TABLET | Freq: Two times a day (BID) | ORAL | 2 refills | Status: DC
Start: 2021-05-26 — End: 2021-07-04

## 2021-05-26 NOTE — Telephone Encounter (Signed)
Patient notified of lab results and requested he return to his PCP office for additional lab work or he can come to our office. Orders for ESR, CRP faxed to PCP office; [facility] [phone]  Carlean Purl, RN

## 2021-05-27 DIAGNOSIS — G894 Chronic pain syndrome: Secondary | ICD-10-CM | POA: Diagnosis not present

## 2021-05-27 DIAGNOSIS — E118 Type 2 diabetes mellitus with unspecified complications: Secondary | ICD-10-CM | POA: Diagnosis not present

## 2021-05-27 DIAGNOSIS — I10 Essential (primary) hypertension: Secondary | ICD-10-CM | POA: Diagnosis not present

## 2021-05-31 ENCOUNTER — Encounter: Payer: Self-pay | Admitting: Internal Medicine

## 2021-05-31 ENCOUNTER — Other Ambulatory Visit: Payer: Self-pay

## 2021-05-31 ENCOUNTER — Telehealth (INDEPENDENT_AMBULATORY_CARE_PROVIDER_SITE_OTHER): Payer: Federal, State, Local not specified - PPO | Admitting: Internal Medicine

## 2021-05-31 ENCOUNTER — Telehealth: Payer: Self-pay

## 2021-05-31 DIAGNOSIS — R2689 Other abnormalities of gait and mobility: Secondary | ICD-10-CM | POA: Diagnosis not present

## 2021-05-31 DIAGNOSIS — M4626 Osteomyelitis of vertebra, lumbar region: Secondary | ICD-10-CM | POA: Diagnosis not present

## 2021-05-31 DIAGNOSIS — G062 Extradural and subdural abscess, unspecified: Secondary | ICD-10-CM | POA: Diagnosis not present

## 2021-05-31 DIAGNOSIS — M4620 Osteomyelitis of vertebra, site unspecified: Secondary | ICD-10-CM

## 2021-05-31 NOTE — Telephone Encounter (Signed)
Awaiting crp and mri for back epidural abscess evaluation

## 2021-05-31 NOTE — Progress Notes (Addendum)
[facility]  Patient Active Problem List   Diagnosis Date Noted   SIADH (syndrome of inappropriate ADH production) ([place]) 02/23/2021   Chronic diastolic congestive heart failure ([place])    Uncontrolled type 2 diabetes mellitus with hyperglycemia ([place])    Acute midline low back pain without sciatica    Pressure injury of skin 01/30/2021   Debility 01/29/2021   Protein-calorie malnutrition, severe 01/13/2021   Discitis 01/13/2021   Abnormal EKG 01/13/2021   Thrombocytosis 01/13/2021   Hypomagnesemia 01/13/2021   Hypokalemia 01/13/2021   Oral thrush 01/13/2021   GERD (gastroesophageal reflux disease) 01/13/2021   Cholestasis 01/13/2021   Normocytic anemia 01/06/2021   Hyponatremia 01/06/2021   Anxiety 01/06/2021   Chronic diastolic CHF (congestive heart failure) ([place]) 01/06/2021   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   MRN: [id number]  No chief complaint on file. Cc -- f/u mrsa bsi and thoracolumbar epidural abscess/OM  HPI:  Louis [name] is a 55 y.o. male with dm2 here for f/o of above issues  He was discharged from hospital 8/28 He was initially admitted late 11/2020 for mrsa bacteremia with vertebral om. Course complicated by worsening vertebral epidural abscess s/p I&D 6/28. He was discharged on daptomycin but returned after a few days on 7/13 with  recurrent mrsa bacteremia, which persisted for a week. Daptomycin found to be resistant  He was switched to combination vanc/ceftaroline with sterilization of bsi. His repeat mri doesn't show any focal epidural abscess to debride. Abd/chest ct showed no other occult abscess and tee showed no endocarditis  He was switched to mono vanc after 2 weeks combo abx on 8/05.    --------- 9/08 id clinic f/u Patient remains in significant back pain with radiating sx to his legs. This is the same as during recent hospital admission, maybe a touch better He denies urinary/bowel incontinence He has no focal weakness in LE otherwise He is taking oxycodone 5 mg bid and gabapentin and tramadol prn for pain at a time No constipation/diarrhea No rash No f/c   10/07 id clinic f/u Patient saw dr Dawley after my last visit. Nothing special is planned. Dr Reatha Armour will graciously continue to follow and review any back mri/imaging ordered Patient reports sweating at night drenching since the beginning of this infection No actual fever No rash No diarrhea He remains on vancomycin with HH  I still do not have labs at all in [facility]/from opat team (who is in our clinic and receives any faxed labs)/epic.  Pain is still severe. He'll be seeing the pain clinic next week. He currently takes oxycodone three times a done, gabapentin also three times a day, and tramadol, and preivously mobic  His PCP is at [place], [place] where he sees often for medical care  11/08 id clinic f/u This is a televideo visit at his request as his back pain is moderate-severe and  he lives 2 hours away I verified that I was speaking with the correct person using two identifiers.  The patient was located at home. The provider was located in the office. The patient did consent to this visit and is aware of charges through their insurance as well as the limitations of evaluation and management by telemedicine. Other persons participating in  this telemedicine service include his wife  We reviewed ct of his lumbar spine again He transitioned from IV vancomycin to doxycycline on 10/26. Tolerating oral abx His functional status and back pain remains same No n/v/diarrhea/rash No f/c No numbness/tingling in bilateral LE or incontinence He'll see dr Dawley tomorrow for nsg follow up. I advised him to discuss potential back brace for comfort and PT/OT He walks with FWW and mostly confined to the house. He doesn't appear depressed or frustrated  05/31/2021 id clinic f/u Patient continues to have moderate-severe pain which is the same Nothing had changed. He continues to use FWW to get around in the house No f/c/n/v/diarrhea We had continued doxy and planned mri T and L spine which are being arranged by our clinic staff He is to see pain clinic for the first time next week He had labs 11/23 cbc 9/14/346; cr 0.73; no crp done    No Known Allergies    Outpatient Medications Prior to Visit  Medication Sig Dispense Refill   acetaminophen (TYLENOL) 325 MG tablet Take 1-2 tablets (325-650 mg total) by mouth every 4 (four) hours as needed for mild pain.     amLODipine (NORVASC) 10 MG tablet Take 1 tablet (10 mg total) by mouth daily. 30 tablet 0   aspirin EC 81 MG tablet Take 81 mg by mouth daily. Swallow whole.     blood glucose meter kit and supplies KIT Dispense based on patient and insurance preference. Use up to four times daily as directed. 1 each 0   diclofenac Sodium (VOLTAREN) 1 % GEL Apply 2 g topically 4 (four) times daily as needed (pain). 350 g 0   doxycycline (VIBRA-TABS) 100 MG tablet Take 1 tablet (100 mg total) by mouth 2 (two) times daily. 60 tablet 2   folic acid (FOLVITE) 1 MG tablet Take 1 tablet (1 mg total) by mouth daily. 30 tablet 0   furosemide (LASIX) 20 MG tablet Take 1 tablet (20 mg total) by mouth daily. 30 tablet 0   gabapentin (NEURONTIN) 100 MG capsule Take 1 capsule (100 mg total) by mouth 2 (two)  times daily. 60 capsule 0   gabapentin (NEURONTIN) 300 MG capsule Take 1 capsule (300 mg total) by mouth 3 (three) times daily. 90 capsule 0   hydrocortisone cream 0.5 % Apply topically 2 (two) times daily. 30 g 0   hydrOXYzine (ATARAX/VISTARIL) 10 MG tablet Take 1 tablet (10 mg total) by mouth 3 (three) times daily as needed for anxiety, nausea or vomiting. 75 tablet 0   insulin aspart (NOVOLOG) 100 UNIT/ML FlexPen Inject 4 Units into the skin 3 (three) times daily with meals. 15 mL 1   Insulin Glargine (BASAGLAR KWIKPEN) 100 UNIT/ML Inject 15 Units into the skin daily. 15 mL 1   Insulin Pen Needle (PEN NEEDLES) 32G X 6 MM MISC 1 application by Does not apply route 2 (two) times daily. 100 each 1   iron polysaccharides (NIFEREX) 150 MG capsule Take 1 capsule (150 mg total) by mouth daily. 30 capsule 0   losartan (COZAAR) 25 MG tablet Take 0.5 tablets (12.5 mg total) by  mouth every morning. 30 tablet 0   melatonin 3 MG TABS tablet Take 1 tablet (3 mg total) by mouth at bedtime. 30 tablet 0   methocarbamol (ROBAXIN) 500 MG tablet Take 1 tablet (500 mg total) by mouth every 6 (six) hours as needed for muscle spasms. 120 tablet 0   methocarbamol (ROBAXIN) 500 MG tablet Take 1 tablet (500 mg total) by mouth 4 (four) times daily.     metoprolol succinate (TOPROL-XL) 25 MG 24 hr tablet Take 1 tablet (25 mg total) by mouth daily. 30 tablet 0   Multiple Vitamin (MULTIVITAMIN WITH MINERALS) TABS tablet Take 1 tablet by mouth daily.     nutrition supplement, JUVEN, (JUVEN) PACK Take 1 packet by mouth 3 (three) times daily between meals. 90 packet 0   ondansetron (ZOFRAN) 4 MG tablet Take 1 tablet (4 mg total) by mouth every 6 (six) hours as needed for nausea. 20 tablet 0   oxyCODONE (OXY IR/ROXICODONE) 5 MG immediate release tablet Take 1 tablet (5 mg total) by mouth every 12 (twelve) hours as needed for severe pain. 10 tablet 0   pantoprazole (PROTONIX) 40 MG tablet Take 1 tablet (40 mg total) by mouth 2  (two) times daily. 60 tablet 0   psyllium (HYDROCIL/METAMUCIL) 95 % PACK Take 1 packet by mouth daily. 240 each    traMADol (ULTRAM) 50 MG tablet Take 1 tablet (50 mg total) by mouth every 6 (six) hours as needed for moderate pain (use for pain rated 4-6/10). 28 tablet 0   No facility-administered medications prior to visit.     Social History   Socioeconomic History   Marital status: Married    Spouse name: Jaymian Bogart   Number of children: 1   Years of education: Not on file   Highest education level: Not on file  Occupational History   Occupation: Postal delivery  Tobacco Use   Smoking status: Every Day    Packs/day: 2.00    Types: Cigarettes    Start date: 06/28/1995   Smokeless tobacco: Never  Vaping Use   Vaping Use: Never used  Substance and Sexual Activity   Alcohol use: Not Currently   Drug use: Never   Sexual activity: Not Currently  Other Topics Concern   Not on file  Social History Narrative   Not on file   Social Determinants of Health   Financial Resource Strain: Not on file  Food Insecurity: Not on file  Transportation Needs: Not on file  Physical Activity: Not on file  Stress: Not on file  Social Connections: Not on file  Intimate Partner Violence: Not on file      Review of Systems    All other ros negative   Objective:    There were no vitals taken for this visit. Nursing note and vital signs reviewed.  Physical Exam  This is a telephone visit   Labs: Lab Results  Component Value Date   WBC 5.2 02/14/2021   HGB 7.4 (L) 02/14/2021   HCT 22.9 (L) 02/14/2021   MCV 83.3 02/14/2021   PLT 440 (H) 14/97/0263   Last metabolic panel Outside lab (with pcp) 11/23 cbc 7/14/346; cr 0.73; lft 12/11/167/0.2  [facility]) 10/09 cbc 6/11/382; cr 0.5; lft 16/13/99/0.5 10/02 esr 61, crp 6.8 (<5), vanc trough 16.8; cbc 6/12/404; cr 0.53; lft 14/11/112/0.4  09/26 crp 25.7 (<5) 9/8 cbc 5/10/456  Crp: 12/02   6.4 (<5); esr  35 (<20) 10/09 32.7 (<5) 9/26  25.7 (<5) 8/23    5 (<1) 8/17    7.6  7/30    7.2 7/14   31  Vanc trough: 9/8   16  Micro:  Serology:  Imaging: 10/24 ct thoracic lumbar spine (outside facility) 1) endplate erosive change and heterogeneous sclerosis/lucency at t7-t3, l3-l4 and l4-l5 compatible with known discitis osteomyelitis. No progressive vertebral body height loss. Surrounding paraspinal cellulitis/phlegmon at these levels. Comparision limited due to different modalities with prior mri  2) limited assessment of canal by ct, however, suggestion ofsoft tissue thickening within ventral canal l4-l5 concerning for residual epidural collection  3) multilevel superimposed degenerative change in thoracic/lumbar spine  9/12 mr lumbar thoracic spine 1. Persistent findings of discitis/osteomyelitis at T7, T8, and L3 through L5. 2. Persistent epidural collection in the thoracolumbar spine concerning for residual epidural abscess which appears slightly increased in size since the prior study. Specifically, canal patency at L3-L4 and L4-L5 has worsened, with severe canal stenosis and compression of the cauda equina nerve roots. 3. Moderate spinal canal stenosis without evidence of cord compression at T7-T8 due to the persistent epidural collection is overall not significantly changed. 4. Persistent left pleural effusion and resolved right pleural effusion. 5. Persistent edema throughout the paraspinal and psoas muscles with resolved psoas abscesses.  7/19 tee No valvular vegetation   7/20 ct abd pelv with contrast Progressive erosive changes involving the L3 and L4 vertebral bodies in keeping with progressive changes of discitis osteomyelitis. No pathologic fracture. Increasing paraspinal inflammatory collections adjacent to L3-L5 bilaterally without discrete drainable fluid at this time.   Progressive anasarca with development of pleural effusions, mild ascites, and moderate  diffuse subcutaneous body wall edema.   Aortic Atherosclerosis   Assessment & Plan:   Problem List Items Addressed This Visit       Nervous and Auditory   Epidural abscess - Primary     Musculoskeletal and Integument   Vertebral osteomyelitis ([place])    No orders of the defined types were placed in this encounter.    CC: Mrsa bacteremia Thoracolumbar epidural abscess/osteomyelitis (t5-s1) paraspinal myositis Psoas abscess (resolved on 9/12 mr thoracolumbar spine)   Abx: 7/14-20; 7/21-c vanc  7/20-8/05 ceftaroline Previously daptomycin   ASSESSMENT: 55 yo male recent mrsa bacteremia/epidural abscess/OM and psoas abscess s/p open debridement 6/28 and IR drainage of left psoas discharged on daptomycin, now readmitted 7/13 for worsening back pain of a few days found to have recurrent mrsa bacteremia   7/13 & 7/15 & 7/17 & 7/19 & 7/20 bcx mrsa  7/22, 24, 26 bcx negative   7/13 mri lumbar/thoracic showed several areas of fluid collection with a more loculated focus at L4-5. There is no focal LE neurologic deficit or urinary/bowel incontinence. NSG reviewed case and felt no urgent decompression at this time   7/16 IR reviewed case and percutaneously drained the 5 cm L3 level subdural fluid collection.   7/17 tte no vegetation 7/19 tee no vegetation 7/20 abd/pelv ct with contrast no occult abscess   Wbc improving on vanc, but persistent bacteremia switched to combination abx therapy 7/20 dapto/ceftaroline --> vanc/ceftaroline on 7/21 (dapto resistant on sensitivity testing 7/13)   Repeat mri no drainable discrete abscess. Discussed with dr Dawley of nsg on 7/21.    As blood cx sterilized on combination therapy, switched back to monotherapy vancomycin on 8/05 with plan to do at least 6 weeks  03/03/21 assessment Significant pain but no other neurological deficit LE otherwise Given significant pain will refer to pain  clinic - advise patient to discuss this with his nsg as  well Will need repeat mri thoracic and lumbar spine to reassess the epidural abscess Will likely need prolonged oral doxycycline (R bactrim), given significant abscess and prolonged bacteremia and osseous involvement Vanc trough 9/8 is 16   04/01/21 assessment Pain remains severe On appropriate dosing of vanc  05/03/21 assessment From 10/24 ct still presence of extensive infectious process of his thoracolumbar spine/epidural space and paraspinal muscle with dynamic changes that doesn't suggest worsening of his disease. The caveat is crp is uptrending as well -- will need to follow up closely Transitioned from iv vancomycin to doxy on 10/26. Anticipate prolonged oral abx course  12/06 assessment Patient has had 5 months of appropriate abx He had extensive epidural abscess Discussed with him pain might not be related to effective infection treatment At this time due to severity of pain and known residual abscess will repeat mri t and l spine Crp being checked He'll continue on doxy for now  Will have another televideo visit in 4 weeks He requested mri be done in [place] Agree he'll need to see pain clinic which is the first time next week    I verified that I was speaking with the correct person using two identifiers. Due to the COVID-19 Pandemic/patient's request, this service was provided via telemedicine using audio/visual media.   The patient was located at home. The provider was located in the office. The patient did consent to this visit and is aware of charges through their insurance as well as the limitations of evaluation and management by telemedicine. Other persons participating in this telemedicine service were none. Time spent on visit was greater than 20 minutes on media and in coordination of care  -------- Addendum Post visit received labs from pcp's office 12/02 crp   6.4 (<5); esr 35 (<20)  Crp much improved than 2 months ago reassuring. Await mri  repeat   Follow-up: Return in about 4 weeks (around 06/28/2021).     Jabier Mutton, [place] [place] [phone]  pager   [phone] cell 05/31/2021, 9:30 AM

## 2021-05-31 NOTE — Telephone Encounter (Signed)
I have faxed patient's lab orders to have drawn the end of Dec or beginning of Jan at his pcp office [facility] [employer]) ha been faxed to [phone]. Patient is scheduled for follow up appointment and aware to have labs done with pcp office a week before his appointment.  Louis [name] T Pricilla Loveless

## 2021-05-31 NOTE — Telephone Encounter (Signed)
Called patient to get a 4 week f/u virtual appointment scheduled with Dr. Renold Don, no answer or voicemail set up.Marland KitchenMarland Kitchen

## 2021-06-06 DIAGNOSIS — R29890 Loss of height: Secondary | ICD-10-CM | POA: Diagnosis not present

## 2021-06-06 DIAGNOSIS — M48061 Spinal stenosis, lumbar region without neurogenic claudication: Secondary | ICD-10-CM | POA: Diagnosis not present

## 2021-06-06 DIAGNOSIS — G062 Extradural and subdural abscess, unspecified: Secondary | ICD-10-CM | POA: Diagnosis not present

## 2021-06-13 ENCOUNTER — Encounter: Payer: Self-pay | Admitting: Internal Medicine

## 2021-07-01 DIAGNOSIS — M4626 Osteomyelitis of vertebra, lumbar region: Secondary | ICD-10-CM | POA: Diagnosis not present

## 2021-07-01 DIAGNOSIS — R2689 Other abnormalities of gait and mobility: Secondary | ICD-10-CM | POA: Diagnosis not present

## 2021-07-04 ENCOUNTER — Telehealth (INDEPENDENT_AMBULATORY_CARE_PROVIDER_SITE_OTHER): Payer: Federal, State, Local not specified - PPO | Admitting: Internal Medicine

## 2021-07-04 ENCOUNTER — Other Ambulatory Visit: Payer: Self-pay

## 2021-07-04 ENCOUNTER — Encounter: Payer: Self-pay | Admitting: Internal Medicine

## 2021-07-04 DIAGNOSIS — K6812 Psoas muscle abscess: Secondary | ICD-10-CM

## 2021-07-04 DIAGNOSIS — G062 Extradural and subdural abscess, unspecified: Secondary | ICD-10-CM

## 2021-07-04 DIAGNOSIS — M4620 Osteomyelitis of vertebra, site unspecified: Secondary | ICD-10-CM | POA: Diagnosis not present

## 2021-07-04 MED ORDER — DOXYCYCLINE HYCLATE 100 MG PO TABS: 100.0000 mg | ORAL_TABLET | Freq: Two times a day (BID) | ORAL | 3 refills | Status: AC

## 2021-07-04 NOTE — Addendum Note (Signed)
Addended by: Prudencio Pair T on: 07/04/2021 12:11 PM   Modules accepted: Orders

## 2021-07-04 NOTE — Progress Notes (Addendum)
[facility]  Patient Active Problem List   Diagnosis Date Noted   SIADH (syndrome of inappropriate ADH production) ([place]) 02/23/2021   Chronic diastolic congestive heart failure ([place])    Uncontrolled type 2 diabetes mellitus with hyperglycemia ([place])    Acute midline low back pain without sciatica    Pressure injury of skin 01/30/2021   Debility 01/29/2021   Protein-calorie malnutrition, severe 01/13/2021   Discitis 01/13/2021   Abnormal EKG 01/13/2021   Thrombocytosis 01/13/2021   Hypomagnesemia 01/13/2021   Hypokalemia 01/13/2021   Oral thrush 01/13/2021   GERD (gastroesophageal reflux disease) 01/13/2021   Cholestasis 01/13/2021   Normocytic anemia 01/06/2021   Hyponatremia 01/06/2021   Anxiety 01/06/2021   Chronic diastolic CHF (congestive heart failure) ([place]) 01/06/2021   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   MRN: [id number]  Chief Complaint  Patient presents with   Follow-up    Back pain same. No issues with doxy.    Cc -- f/u mrsa bsi and thoracolumbar epidural abscess/OM  HPI:  Louis [name] is a 56 y.o. male with dm2 here for f/o of above issues  He was discharged from hospital 8/28 He was initially admitted late 11/2020 for mrsa bacteremia with vertebral om. Course complicated by worsening vertebral epidural abscess s/p I&D 6/28. He was  discharged on daptomycin but returned after a few days on 7/13 with recurrent mrsa bacteremia, which persisted for a week. Daptomycin found to be resistant  He was switched to combination vanc/ceftaroline with sterilization of bsi. His repeat mri doesn't show any focal epidural abscess to debride. Abd/chest ct showed no other occult abscess and tee showed no endocarditis  He was switched to mono vanc after 2 weeks combo abx on 8/05.    --------- 9/08 id clinic f/u Patient remains in significant back pain with radiating sx to his legs. This is the same as during recent hospital admission, maybe a touch better He denies urinary/bowel incontinence He has no focal weakness in LE otherwise He is taking oxycodone 5 mg bid and gabapentin and tramadol prn for pain at a time No constipation/diarrhea No rash No f/c   10/07 id clinic f/u Patient saw dr Dawley after my last visit. Nothing special is planned. Dr Reatha Armour will graciously continue to follow and review any back mri/imaging ordered Patient reports sweating at night drenching since the beginning of this infection No actual fever No rash No diarrhea He remains on vancomycin with HH  I still do not have labs at all in [facility]/from opat team (who is in our clinic and receives any faxed labs)/epic.  Pain is still severe. He'll be seeing the pain clinic next week. He currently takes oxycodone three times a done, gabapentin also three times a day, and tramadol, and preivously mobic  His PCP is at [place], [place] where he sees often for medical care  11/08 id  clinic f/u This is a televideo visit at his request as his back pain is moderate-severe and he lives 2 hours away I verified that I was speaking with the correct person using two identifiers.  The patient was located at home. The provider was located in the office. The patient did consent to this visit and is aware of charges through their insurance as well as the limitations of  evaluation and management by telemedicine. Other persons participating in this telemedicine service include his wife  We reviewed ct of his lumbar spine again He transitioned from IV vancomycin to doxycycline on 10/26. Tolerating oral abx His functional status and back pain remains same No n/v/diarrhea/rash No f/c No numbness/tingling in bilateral LE or incontinence He'll see dr Dawley tomorrow for nsg follow up. I advised him to discuss potential back brace for comfort and PT/OT He walks with FWW and mostly confined to the house. He doesn't appear depressed or frustrated  05/31/2021 id clinic f/u Patient continues to have moderate-severe pain which is the same Nothing had changed. He continues to use FWW to get around in the house No f/c/n/v/diarrhea We had continued doxy and planned mri T and L spine which are being arranged by our clinic staff He is to see pain clinic for the first time next week He had labs 11/23 cbc 9/14/346; cr 0.73; no crp done  07/04/21 id clinic visit Pretty much same sx since last visit. No new neurologic sx Continues on doxy Reviewed mri result again thoracolumbar spine No n/v/diarrhea/rash Havent' seen complex pain clinic as he had some stomach bug issue; next new visit end of 06/2021 Walking around house and minimally outside with [place] He is frustrated but not depressed/suicidal  No Known Allergies    Outpatient Medications Prior to Visit  Medication Sig Dispense Refill   acetaminophen (TYLENOL) 325 MG tablet Take 1-2 tablets (325-650 mg total) by mouth every 4 (four) hours as needed for mild pain.     amLODipine (NORVASC) 10 MG tablet Take 1 tablet (10 mg total) by mouth daily. 30 tablet 0   aspirin EC 81 MG tablet Take 81 mg by mouth daily. Swallow whole.     blood glucose meter kit and supplies KIT Dispense based on patient and insurance preference. Use up to four times daily as directed. 1 each 0   diclofenac Sodium (VOLTAREN) 1 % GEL Apply 2 g  topically 4 (four) times daily as needed (pain). 350 g 0   doxycycline (VIBRA-TABS) 100 MG tablet Take 1 tablet (100 mg total) by mouth 2 (two) times daily. 60 tablet 2   folic acid (FOLVITE) 1 MG tablet Take 1 tablet (1 mg total) by mouth daily. 30 tablet 0   furosemide (LASIX) 20 MG tablet Take 1 tablet (20 mg total) by mouth daily. 30 tablet 0   gabapentin (NEURONTIN) 100 MG capsule Take 1 capsule (100 mg total) by mouth 2 (two) times daily. 60 capsule 0   gabapentin (NEURONTIN) 300 MG capsule Take 1 capsule (300 mg total) by mouth 3 (three) times daily. 90 capsule 0   hydrocortisone cream 0.5 % Apply topically 2 (two) times daily. 30 g 0   hydrOXYzine (ATARAX/VISTARIL) 10 MG tablet Take 1 tablet (10 mg total) by mouth 3 (three) times daily as needed for anxiety, nausea or vomiting. 75 tablet 0   insulin aspart (NOVOLOG) 100 UNIT/ML FlexPen Inject 4 Units into the skin 3 (three) times daily with meals. 15 mL 1   Insulin Glargine (  BASAGLAR KWIKPEN) 100 UNIT/ML Inject 15 Units into the skin daily. 15 mL 1   Insulin Pen Needle (PEN NEEDLES) 32G X 6 MM MISC 1 application by Does not apply route 2 (two) times daily. 100 each 1   iron polysaccharides (NIFEREX) 150 MG capsule Take 1 capsule (150 mg total) by mouth daily. 30 capsule 0   losartan (COZAAR) 25 MG tablet Take 0.5 tablets (12.5 mg total) by mouth every morning. 30 tablet 0   melatonin 3 MG TABS tablet Take 1 tablet (3 mg total) by mouth at bedtime. 30 tablet 0   methocarbamol (ROBAXIN) 500 MG tablet Take 1 tablet (500 mg total) by mouth every 6 (six) hours as needed for muscle spasms. 120 tablet 0   methocarbamol (ROBAXIN) 500 MG tablet Take 1 tablet (500 mg total) by mouth 4 (four) times daily.     metoprolol succinate (TOPROL-XL) 25 MG 24 hr tablet Take 1 tablet (25 mg total) by mouth daily. 30 tablet 0   Multiple Vitamin (MULTIVITAMIN WITH MINERALS) TABS tablet Take 1 tablet by mouth daily.     nutrition supplement, JUVEN, (JUVEN) PACK  Take 1 packet by mouth 3 (three) times daily between meals. 90 packet 0   ondansetron (ZOFRAN) 4 MG tablet Take 1 tablet (4 mg total) by mouth every 6 (six) hours as needed for nausea. 20 tablet 0   oxyCODONE (OXY IR/ROXICODONE) 5 MG immediate release tablet Take 1 tablet (5 mg total) by mouth every 12 (twelve) hours as needed for severe pain. 10 tablet 0   pantoprazole (PROTONIX) 40 MG tablet Take 1 tablet (40 mg total) by mouth 2 (two) times daily. 60 tablet 0   psyllium (HYDROCIL/METAMUCIL) 95 % PACK Take 1 packet by mouth daily. 240 each    traMADol (ULTRAM) 50 MG tablet Take 1 tablet (50 mg total) by mouth every 6 (six) hours as needed for moderate pain (use for pain rated 4-6/10). 28 tablet 0   No facility-administered medications prior to visit.     Social History   Socioeconomic History   Marital status: Married    Spouse name: Alberto Pina   Number of children: 1   Years of education: Not on file   Highest education level: Not on file  Occupational History   Occupation: Postal delivery  Tobacco Use   Smoking status: Every Day    Packs/day: 2.00    Types: Cigarettes    Start date: 06/28/1995   Smokeless tobacco: Never  Vaping Use   Vaping Use: Never used  Substance and Sexual Activity   Alcohol use: Not Currently   Drug use: Never   Sexual activity: Not Currently  Other Topics Concern   Not on file  Social History Narrative   Not on file   Social Determinants of Health   Financial Resource Strain: Not on file  Food Insecurity: Not on file  Transportation Needs: Not on file  Physical Activity: Not on file  Stress: Not on file  Social Connections: Not on file  Intimate Partner Violence: Not on file      Review of Systems    All other ros negative   Objective:    There were no vitals taken for this visit. Nursing note and vital signs reviewed.  Physical Exam  This is a telephone visit   Labs: Lab Results  Component Value Date   WBC 5.2 02/14/2021    HGB 7.4 (L) 02/14/2021   HCT 22.9 (L) 02/14/2021   MCV 83.3 02/14/2021  PLT 440 (H) 50/02/3817   Last metabolic panel Outside lab (with pcp) 11/23 cbc 7/14/346; cr 0.73; lft 12/11/167/0.2  [facility] lab core ([facility]) 10/09 cbc 6/11/382; cr 0.5; lft 16/13/99/0.5 10/02 esr 61, crp 6.8 (<5), vanc trough 16.8; cbc 6/12/404; cr 0.53; lft 14/11/112/0.4  09/26 crp 25.7 (<5) 9/8 cbc 5/10/456  Crp: 12/02   6.4 (<5); esr 35 (<20) 10/09 32.7 (<5) 9/26  25.7 (<5) 8/23    5 (<1) 8/17    7.6  7/30    7.2 7/14   31  Vanc trough: 9/8   16  Micro:  Serology:  Imaging: 10/24 ct thoracic lumbar spine (outside facility) 1) endplate erosive change and heterogeneous sclerosis/lucency at t7-t3, l3-l4 and l4-l5 compatible with known discitis osteomyelitis. No progressive vertebral body height loss. Surrounding paraspinal cellulitis/phlegmon at these levels. Comparision limited due to different modalities with prior mri  2) limited assessment of canal by ct, however, suggestion ofsoft tissue thickening within ventral canal l4-l5 concerning for residual epidural collection  3) multilevel superimposed degenerative change in thoracic/lumbar spine  9/12 mr lumbar thoracic spine 1. Persistent findings of discitis/osteomyelitis at T7, T8, and L3 through L5. 2. Persistent epidural collection in the thoracolumbar spine concerning for residual epidural abscess which appears slightly increased in size since the prior study. Specifically, canal patency at L3-L4 and L4-L5 has worsened, with severe canal stenosis and compression of the cauda equina nerve roots. 3. Moderate spinal canal stenosis without evidence of cord compression at T7-T8 due to the persistent epidural collection is overall not significantly changed. 4. Persistent left pleural effusion and resolved right pleural effusion. 5. Persistent edema throughout the paraspinal and psoas muscles with resolved psoas abscesses.  7/19  tee No valvular vegetation   7/20 ct abd pelv with contrast Progressive erosive changes involving the L3 and L4 vertebral bodies in keeping with progressive changes of discitis osteomyelitis. No pathologic fracture. Increasing paraspinal inflammatory collections adjacent to L3-L5 bilaterally without discrete drainable fluid at this time.   Progressive anasarca with development of pleural effusions, mild ascites, and moderate diffuse subcutaneous body wall edema.   Aortic Atherosclerosis   Assessment & Plan:   Problem List Items Addressed This Visit       Nervous and Auditory   Epidural abscess     Musculoskeletal and Integument   Vertebral osteomyelitis ([place]) - Primary   No orders of the defined types were placed in this encounter.    CC: Mrsa bacteremia Thoracolumbar epidural abscess/osteomyelitis (t5-s1) paraspinal myositis Psoas abscess (resolved on 9/12 mr thoracolumbar spine)   Abx: 7/14-20; 7/21-c vanc --> doxycycline  7/20-8/05 ceftaroline Previously daptomycin   ASSESSMENT: 56 yo male recent mrsa bacteremia/epidural abscess/OM and psoas abscess s/p open debridement 6/28 and IR drainage of left psoas discharged on daptomycin, now readmitted 7/13 for worsening back pain of a few days found to have recurrent mrsa bacteremia   7/13 & 7/15 & 7/17 & 7/19 & 7/20 bcx mrsa  7/22, 24, 26 bcx negative   7/13 mri lumbar/thoracic showed several areas of fluid collection with a more loculated focus at L4-5. There is no focal LE neurologic deficit or urinary/bowel incontinence. NSG reviewed case and felt no urgent decompression at this time   7/16 IR reviewed case and percutaneously drained the 5 cm L3 level subdural fluid collection.   7/17 tte no vegetation 7/19 tee no vegetation 7/20 abd/pelv ct with contrast no occult abscess   Wbc improving on vanc, but persistent bacteremia switched to combination abx  therapy 7/20 dapto/ceftaroline --> vanc/ceftaroline on 7/21  (dapto resistant on sensitivity testing 7/13)   Repeat mri no drainable discrete abscess. Discussed with dr Dawley of nsg on 7/21.    As blood cx sterilized on combination therapy, switched back to monotherapy vancomycin on 8/05 with plan to do at least 6 weeks  03/03/21 assessment Significant pain but no other neurological deficit LE otherwise Given significant pain will refer to pain clinic - advise patient to discuss this with his nsg as well Will need repeat mri thoracic and lumbar spine to reassess the epidural abscess Will likely need prolonged oral doxycycline (R bactrim), given significant abscess and prolonged bacteremia and osseous involvement Vanc trough 9/8 is 16   04/01/21 assessment Pain remains severe On appropriate dosing of vanc  05/03/21 assessment From 10/24 ct still presence of extensive infectious process of his thoracolumbar spine/epidural space and paraspinal muscle with dynamic changes that doesn't suggest worsening of his disease. The caveat is crp is uptrending as well -- will need to follow up closely Transitioned from iv vancomycin to doxy on 10/26. Anticipate prolonged oral abx course  12/06 assessment Patient has had 5 months of appropriate abx He had extensive epidural abscess Discussed with him pain might not be related to effective infection treatment At this time due to severity of pain and known residual abscess will repeat mri t and l spine Crp being checked He'll continue on doxy for now  Will have another televideo visit in 4 weeks He requested mri be done in [place] Agree he'll need to see pain clinic which is the first time next week    I verified that I was speaking with the correct person using two identifiers. Due to the COVID-19 Pandemic/patient's request, this service was provided via telemedicine using audio/visual media.   The patient was located at home. The provider was located in the office. The patient did consent to this  visit and is aware of charges through their insurance as well as the limitations of evaluation and management by telemedicine. Other persons participating in this telemedicine service were none. Time spent on visit was greater than 20 minutes on media and in coordination of care  -------- Addendum Post visit received labs from pcp's office 12/02 crp   6.4 (<5); esr 35 (<20)  Crp much improved than 2 months ago reassuring. Await mri repeat   07/04/21 assessment Reviewed mri result again  Mostly improved Pain same but would likely need ongoing complex pain management visits. No new LE neurologic deficit Tolerating doxy  -continue doxy for now.  -repeat mri in 3-4 weeks  -video visit in 5-6 weeks   Follow-up: Return in about 5 weeks (around 08/08/2021).    I verified that I was speaking with the correct person using two identifiers. Due to the COVID-19 Pandemic/his request, this service was provided via telemedicine using audio/visual media.   The patient was located at home. The provider was located in the office. The patient did consent to this visit and is aware of charges through their insurance as well as the limitations of evaluation and management by telemedicine. Other persons participating in this telemedicine service were none. Time spent on visit was greater than 20 minutes on media and in coordination of care  This is a phone visit  -------------- Addendum He has been paying out of pocket for mri and inquire if we can forgo. I discussed we need both labs/mri. In this setting will defer mri for now, and extend empirically  his doxycycline duration  -no definitive stop date yet for doxy -will eventually need mri will plan to do in the next 2-3 months    Jabier Mutton, MD [facility] for [place] [phone]  pager   [phone] cell 07/04/2021, 11:46 AM

## 2021-07-04 NOTE — Patient Instructions (Signed)
Continue doxy  Repeat mri lumbar spine 3-4 weeks; cbc/cmp/crp remote labs ordered  Video visit 5-6 weeks

## 2021-08-01 DIAGNOSIS — M4626 Osteomyelitis of vertebra, lumbar region: Secondary | ICD-10-CM | POA: Diagnosis not present

## 2021-08-01 DIAGNOSIS — R2689 Other abnormalities of gait and mobility: Secondary | ICD-10-CM | POA: Diagnosis not present

## 2021-08-16 ENCOUNTER — Telehealth: Payer: Self-pay

## 2021-08-16 NOTE — Telephone Encounter (Signed)
Patient called, has appointment with PCP Bobette Mo) tomorrow and would like our office to fax orders for any lab work needed prior to his MRI. Will route to provider.   Sandie Ano, RN

## 2021-08-16 NOTE — Telephone Encounter (Signed)
Orders faxed to patient's PCP.   Sandie Ano, RN

## 2021-08-17 DIAGNOSIS — E785 Hyperlipidemia, unspecified: Secondary | ICD-10-CM | POA: Diagnosis not present

## 2021-08-17 DIAGNOSIS — E1159 Type 2 diabetes mellitus with other circulatory complications: Secondary | ICD-10-CM | POA: Diagnosis not present

## 2021-08-17 DIAGNOSIS — E119 Type 2 diabetes mellitus without complications: Secondary | ICD-10-CM | POA: Diagnosis not present

## 2021-08-17 DIAGNOSIS — G8929 Other chronic pain: Secondary | ICD-10-CM | POA: Diagnosis not present

## 2021-08-17 DIAGNOSIS — I10 Essential (primary) hypertension: Secondary | ICD-10-CM | POA: Diagnosis not present

## 2021-08-17 DIAGNOSIS — I110 Hypertensive heart disease with heart failure: Secondary | ICD-10-CM | POA: Diagnosis not present

## 2021-08-17 DIAGNOSIS — I2510 Atherosclerotic heart disease of native coronary artery without angina pectoris: Secondary | ICD-10-CM | POA: Diagnosis not present

## 2021-08-17 DIAGNOSIS — M4620 Osteomyelitis of vertebra, site unspecified: Secondary | ICD-10-CM | POA: Diagnosis not present

## 2021-08-17 DIAGNOSIS — G319 Degenerative disease of nervous system, unspecified: Secondary | ICD-10-CM | POA: Diagnosis not present

## 2021-08-23 ENCOUNTER — Other Ambulatory Visit: Payer: Self-pay

## 2021-08-23 ENCOUNTER — Ambulatory Visit (INDEPENDENT_AMBULATORY_CARE_PROVIDER_SITE_OTHER): Payer: Federal, State, Local not specified - PPO | Admitting: Internal Medicine

## 2021-08-23 DIAGNOSIS — M4620 Osteomyelitis of vertebra, site unspecified: Secondary | ICD-10-CM | POA: Diagnosis not present

## 2021-08-23 DIAGNOSIS — G062 Extradural and subdural abscess, unspecified: Secondary | ICD-10-CM | POA: Diagnosis not present

## 2021-08-23 NOTE — Progress Notes (Signed)
[facility]  Patient Active Problem List   Diagnosis Date Noted   SIADH (syndrome of inappropriate ADH production) ([place]) 02/23/2021   Chronic diastolic congestive heart failure ([place])    Uncontrolled type 2 diabetes mellitus with hyperglycemia ([place])    Acute midline low back pain without sciatica    Pressure injury of skin 01/30/2021   Debility 01/29/2021   Protein-calorie malnutrition, severe 01/13/2021   Discitis 01/13/2021   Abnormal EKG 01/13/2021   Thrombocytosis 01/13/2021   Hypomagnesemia 01/13/2021   Hypokalemia 01/13/2021   Oral thrush 01/13/2021   GERD (gastroesophageal reflux disease) 01/13/2021   Cholestasis 01/13/2021   Normocytic anemia 01/06/2021   Hyponatremia 01/06/2021   Anxiety 01/06/2021   Chronic diastolic CHF (congestive heart failure) ([place]) 01/06/2021   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   MRN: [id number]  No chief complaint on file.  Cc -- f/u mrsa bsi and thoracolumbar epidural abscess/OM  HPI:  Louis [name] is a 56 y.o. male with dm2 here for f/o of above issues  He was discharged from hospital 8/28 He was initially admitted late 11/2020 for mrsa bacteremia with vertebral om. Course complicated by worsening vertebral epidural abscess s/p I&D 6/28. He was discharged on daptomycin but returned after a few days on 7/13 with  recurrent mrsa bacteremia, which persisted for a week. Daptomycin found to be resistant  He was switched to combination vanc/ceftaroline with sterilization of bsi. His repeat mri doesn't show any focal epidural abscess to debride. Abd/chest ct showed no other occult abscess and tee showed no endocarditis  He was switched to mono vanc after 2 weeks combo abx on 8/05.    --------- 9/08 id clinic f/u Patient remains in significant back pain with radiating sx to his legs. This is the same as during recent hospital admission, maybe a touch better He denies urinary/bowel incontinence He has no focal weakness in LE otherwise He is taking oxycodone 5 mg bid and gabapentin and tramadol prn for pain at a time No constipation/diarrhea No rash No f/c   10/07 id clinic f/u Patient saw dr Dawley after my last visit. Nothing special is planned. Dr Reatha Armour will graciously continue to follow and review any back mri/imaging ordered Patient reports sweating at night drenching since the beginning of this infection No actual fever No rash No diarrhea He remains on vancomycin with HH  I still do not have labs at all in [facility]/from opat team (who is in our clinic and receives any faxed labs)/epic.  Pain is still severe. He'll be seeing the pain clinic next week. He currently takes oxycodone three times a done, gabapentin also three times a day, and tramadol, and preivously mobic  His PCP is at [place], [place] where he sees often for medical care  11/08 id clinic f/u This is a televideo visit at his request as his back pain is moderate-severe  and he lives 2 hours away I verified that I was speaking with the correct person using two identifiers.  The patient was located at home. The provider was located in the office. The patient did consent to this visit and is aware of charges through their insurance as well as the limitations of evaluation and management by telemedicine. Other persons participating in  this telemedicine service include his wife  We reviewed ct of his lumbar spine again He transitioned from IV vancomycin to doxycycline on 10/26. Tolerating oral abx His functional status and back pain remains same No n/v/diarrhea/rash No f/c No numbness/tingling in bilateral LE or incontinence He'll see dr Dawley tomorrow for nsg follow up. I advised him to discuss potential back brace for comfort and PT/OT He walks with FWW and mostly confined to the house. He doesn't appear depressed or frustrated  05/31/2021 id clinic f/u Patient continues to have moderate-severe pain which is the same Nothing had changed. He continues to use FWW to get around in the house No f/c/n/v/diarrhea We had continued doxy and planned mri T and L spine which are being arranged by our clinic staff He is to see pain clinic for the first time next week He had labs 11/23 cbc 9/14/346; cr 0.73; no crp done  07/04/21 id clinic visit Pretty much same sx since last visit. No new neurologic sx Continues on doxy Reviewed mri result again thoracolumbar spine No n/v/diarrhea/rash Havent' seen complex pain clinic as he had some stomach bug issue; next new visit end of 06/2021 Walking around house and minimally outside with [place] He is frustrated but not depressed/suicidal   2/28 id clinic visit Tolerating doxy Stable back sx Asks about when to stop  No n/d/rash No f/c   No Known Allergies    Outpatient Medications Prior to Visit  Medication Sig Dispense Refill   acetaminophen (TYLENOL) 325 MG tablet Take 1-2 tablets (325-650 mg total) by mouth every 4 (four) hours as needed for mild pain.     amLODipine (NORVASC) 10 MG tablet Take 1 tablet (10 mg total) by mouth daily. 30 tablet 0   aspirin EC 81 MG tablet Take 81 mg by mouth daily. Swallow whole.     blood glucose meter kit and supplies KIT Dispense based on patient and insurance preference. Use up to four times daily as directed. 1 each 0   diclofenac  Sodium (VOLTAREN) 1 % GEL Apply 2 g topically 4 (four) times daily as needed (pain). 350 g 0   doxycycline (VIBRA-TABS) 100 MG tablet Take 1 tablet (100 mg total) by mouth 2 (two) times daily. 60 tablet 3   folic acid (FOLVITE) 1 MG tablet Take 1 tablet (1 mg total) by mouth daily. 30 tablet 0   furosemide (LASIX) 20 MG tablet Take 1 tablet (20 mg total) by mouth daily. 30 tablet 0   gabapentin (NEURONTIN) 100 MG capsule Take 1 capsule (100 mg total) by mouth 2 (two) times daily. 60 capsule 0   gabapentin (NEURONTIN) 300 MG capsule Take 1 capsule (300 mg total) by mouth 3 (three) times daily. 90 capsule 0   hydrocortisone cream 0.5 % Apply topically 2 (two) times daily. 30 g 0   hydrOXYzine (ATARAX/VISTARIL) 10 MG tablet Take 1 tablet (10 mg total) by mouth 3 (three) times daily as needed for anxiety, nausea or vomiting. 75 tablet 0   insulin aspart (NOVOLOG) 100 UNIT/ML FlexPen Inject 4 Units into the skin 3 (three) times daily with meals. 15  mL 1   Insulin Glargine (BASAGLAR KWIKPEN) 100 UNIT/ML Inject 15 Units into the skin daily. 15 mL 1   Insulin Pen Needle (PEN NEEDLES) 32G X 6 MM MISC 1 application by Does not apply route 2 (two) times daily. 100 each 1   iron polysaccharides (NIFEREX) 150 MG capsule Take 1 capsule (150 mg total) by mouth daily. 30 capsule 0   losartan (COZAAR) 25 MG tablet Take 0.5 tablets (12.5 mg total) by mouth every morning. 30 tablet 0   melatonin 3 MG TABS tablet Take 1 tablet (3 mg total) by mouth at bedtime. 30 tablet 0   methocarbamol (ROBAXIN) 500 MG tablet Take 1 tablet (500 mg total) by mouth every 6 (six) hours as needed for muscle spasms. 120 tablet 0   methocarbamol (ROBAXIN) 500 MG tablet Take 1 tablet (500 mg total) by mouth 4 (four) times daily.     metoprolol succinate (TOPROL-XL) 25 MG 24 hr tablet Take 1 tablet (25 mg total) by mouth daily. 30 tablet 0   Multiple Vitamin (MULTIVITAMIN WITH MINERALS) TABS tablet Take 1 tablet by mouth daily.      nutrition supplement, JUVEN, (JUVEN) PACK Take 1 packet by mouth 3 (three) times daily between meals. 90 packet 0   ondansetron (ZOFRAN) 4 MG tablet Take 1 tablet (4 mg total) by mouth every 6 (six) hours as needed for nausea. 20 tablet 0   oxyCODONE (OXY IR/ROXICODONE) 5 MG immediate release tablet Take 1 tablet (5 mg total) by mouth every 12 (twelve) hours as needed for severe pain. 10 tablet 0   pantoprazole (PROTONIX) 40 MG tablet Take 1 tablet (40 mg total) by mouth 2 (two) times daily. 60 tablet 0   psyllium (HYDROCIL/METAMUCIL) 95 % PACK Take 1 packet by mouth daily. 240 each    traMADol (ULTRAM) 50 MG tablet Take 1 tablet (50 mg total) by mouth every 6 (six) hours as needed for moderate pain (use for pain rated 4-6/10). 28 tablet 0   No facility-administered medications prior to visit.     Social History   Socioeconomic History   Marital status: Married    Spouse name: Zarif Rathje   Number of children: 1   Years of education: Not on file   Highest education level: Not on file  Occupational History   Occupation: Postal delivery  Tobacco Use   Smoking status: Every Day    Packs/day: 2.00    Types: Cigarettes    Start date: 06/28/1995   Smokeless tobacco: Never  Vaping Use   Vaping Use: Never used  Substance and Sexual Activity   Alcohol use: Not Currently   Drug use: Never   Sexual activity: Not Currently  Other Topics Concern   Not on file  Social History Narrative   Not on file   Social Determinants of Health   Financial Resource Strain: Not on file  Food Insecurity: Not on file  Transportation Needs: Not on file  Physical Activity: Not on file  Stress: Not on file  Social Connections: Not on file  Intimate Partner Violence: Not on file      Review of Systems    All other ros negative   Objective:    There were no vitals taken for this visit. Nursing note and vital signs reviewed.  Physical Exam  This is a telephone visit   Labs: Lab Results   Component Value Date   WBC 5.2 02/14/2021   HGB 7.4 (L) 02/14/2021   HCT 22.9 (L)  02/14/2021   MCV 83.3 02/14/2021   PLT 440 (H) 93/81/0175   Last metabolic panel Outside lab (with pcp) 11/23 cbc 7/14/346; cr 0.73; lft 12/11/167/0.2  [facility] ([facility]) 10/09 cbc 6/11/382; cr 0.5; lft 16/13/99/0.5 10/02 esr 61, crp 6.8 (<5), vanc trough 16.8; cbc 6/12/404; cr 0.53; lft 14/11/112/0.4  09/26 crp 25.7 (<5) 9/8 cbc 5/10/456  Crp: 12/02   6.4 (<5); esr 35 (<20) 10/09 32.7 (<5) 9/26  25.7 (<5) 8/23    5 (<1) 8/17    7.6  7/30    7.2 7/14   31  Vanc trough: 9/8   16  Micro:  Serology:  Imaging: 10/24 ct thoracic lumbar spine (outside facility) 1) endplate erosive change and heterogeneous sclerosis/lucency at t7-t3, l3-l4 and l4-l5 compatible with known discitis osteomyelitis. No progressive vertebral body height loss. Surrounding paraspinal cellulitis/phlegmon at these levels. Comparision limited due to different modalities with prior mri  2) limited assessment of canal by ct, however, suggestion ofsoft tissue thickening within ventral canal l4-l5 concerning for residual epidural collection  3) multilevel superimposed degenerative change in thoracic/lumbar spine  9/12 mr lumbar thoracic spine 1. Persistent findings of discitis/osteomyelitis at T7, T8, and L3 through L5. 2. Persistent epidural collection in the thoracolumbar spine concerning for residual epidural abscess which appears slightly increased in size since the prior study. Specifically, canal patency at L3-L4 and L4-L5 has worsened, with severe canal stenosis and compression of the cauda equina nerve roots. 3. Moderate spinal canal stenosis without evidence of cord compression at T7-T8 due to the persistent epidural collection is overall not significantly changed. 4. Persistent left pleural effusion and resolved right pleural effusion. 5. Persistent edema throughout the paraspinal and psoas muscles  with resolved psoas abscesses.  7/19 tee No valvular vegetation   7/20 ct abd pelv with contrast Progressive erosive changes involving the L3 and L4 vertebral bodies in keeping with progressive changes of discitis osteomyelitis. No pathologic fracture. Increasing paraspinal inflammatory collections adjacent to L3-L5 bilaterally without discrete drainable fluid at this time.   Progressive anasarca with development of pleural effusions, mild ascites, and moderate diffuse subcutaneous body wall edema.   Aortic Atherosclerosis   Assessment & Plan:   Problem List Items Addressed This Visit       Nervous and Auditory   Epidural abscess - Primary     Musculoskeletal and Integument   Vertebral osteomyelitis ([place])  No orders of the defined types were placed in this encounter.    CC: Mrsa bacteremia Thoracolumbar epidural abscess/osteomyelitis (t5-s1) paraspinal myositis Psoas abscess (resolved on 9/12 mr thoracolumbar spine)   Abx: 7/14-20; 7/21-c vanc --> doxycycline  7/20-8/05 ceftaroline Previously daptomycin   ASSESSMENT: 56 yo male recent mrsa bacteremia/epidural abscess/OM and psoas abscess s/p open debridement 6/28 and IR drainage of left psoas discharged on daptomycin, now readmitted 7/13 for worsening back pain of a few days found to have recurrent mrsa bacteremia   7/13 & 7/15 & 7/17 & 7/19 & 7/20 bcx mrsa  7/22, 24, 26 bcx negative   7/13 mri lumbar/thoracic showed several areas of fluid collection with a more loculated focus at L4-5. There is no focal LE neurologic deficit or urinary/bowel incontinence. NSG reviewed case and felt no urgent decompression at this time   7/16 IR reviewed case and percutaneously drained the 5 cm L3 level subdural fluid collection.   7/17 tte no vegetation 7/19 tee no vegetation 7/20 abd/pelv ct with contrast no occult abscess   Wbc improving on vanc,  but persistent bacteremia switched to combination abx therapy 7/20  dapto/ceftaroline --> vanc/ceftaroline on 7/21 (dapto resistant on sensitivity testing 7/13)   Repeat mri no drainable discrete abscess. Discussed with dr Dawley of nsg on 7/21.    As blood cx sterilized on combination therapy, switched back to monotherapy vancomycin on 8/05 with plan to do at least 6 weeks  08/23/2021 Patient has had more than 6 months of appropriate abx -- transitioned from iv to doxy 04/20/2021 Continues to have persistent moderate-severe pain Seeing pain clinic Paying out of pocket for mri, so we are operating rather blind on duration of doxy  -will plan to repeat mri in another 4-6 weeks -continue doxy for now -crp/cbc/cmp labs to be faxed to local office for him -televisit appointment in 8 weeks (2 weeks after mri)    Follow-up: No follow-ups on file.    I verified that I was speaking with the correct person using two identifiers. Due to the COVID-19 Pandemic/patient's location, this service was provided via telemedicine using audio/visual media.   The patient was located at home. The provider was located in the office. The patient did consent to this visit and is aware of charges through their insurance as well as the limitations of evaluation and management by telemedicine. Other persons participating in this telemedicine service were none. Time spent on visit was greater than 20 minutes on media and in coordination of care   Jabier Mutton, [place] [facility] [place] [phone]  pager   [phone] cell 08/23/2021, 11:17 AM

## 2021-08-23 NOTE — Patient Instructions (Signed)
Please fax order for cbc, cmp, and crp to his local office  I have order mri thoracic and lumbar spine for him to be done within the next 4-6 weeks  Please schedule a televisit for him 2 weeks from the mri (or just 8 weeks from now)  Thank you

## 2021-08-29 DIAGNOSIS — M4626 Osteomyelitis of vertebra, lumbar region: Secondary | ICD-10-CM | POA: Diagnosis not present

## 2021-08-29 DIAGNOSIS — R2689 Other abnormalities of gait and mobility: Secondary | ICD-10-CM | POA: Diagnosis not present

## 2021-08-30 ENCOUNTER — Encounter: Payer: Self-pay | Admitting: Internal Medicine

## 2021-08-30 NOTE — Telephone Encounter (Signed)
[facility]: ?08/17/21  ?Cbc 7/15/365; cr 0.8; lft 12/13/150/0.6 ?CRP 13.4 (<5) ? ? ? ?He is due for a lumbar/thoracic mri which I had ordered for the next few weeks to be done ? ?He has been on oral antibiotics treatment for his back infection. The crp is slightly higher than previously. Alkphos also high ? ?He will need to stay on the oral doxycycline for now. Will see what his mri show and future labs before deciding to stop antibiotics ? ? ?Called him 08/30/2021 and relayed above message ? ?

## 2021-09-14 DIAGNOSIS — E1159 Type 2 diabetes mellitus with other circulatory complications: Secondary | ICD-10-CM | POA: Diagnosis not present

## 2021-09-14 DIAGNOSIS — G8929 Other chronic pain: Secondary | ICD-10-CM | POA: Diagnosis not present

## 2021-09-14 DIAGNOSIS — F325 Major depressive disorder, single episode, in full remission: Secondary | ICD-10-CM | POA: Diagnosis not present

## 2021-09-14 DIAGNOSIS — I5032 Chronic diastolic (congestive) heart failure: Secondary | ICD-10-CM | POA: Diagnosis not present

## 2021-09-27 ENCOUNTER — Telehealth: Payer: Self-pay | Admitting: Internal Medicine

## 2021-09-27 DIAGNOSIS — M40204 Unspecified kyphosis, thoracic region: Secondary | ICD-10-CM | POA: Diagnosis not present

## 2021-09-27 DIAGNOSIS — G062 Extradural and subdural abscess, unspecified: Secondary | ICD-10-CM | POA: Diagnosis not present

## 2021-09-27 DIAGNOSIS — M48061 Spinal stenosis, lumbar region without neurogenic claudication: Secondary | ICD-10-CM | POA: Diagnosis not present

## 2021-09-27 NOTE — Telephone Encounter (Signed)
Mri noncontrast t and l spine 3/31 from [place] ? ?Edema in t6-8 and l3-5 vertebral bodies c/w sequela of discitis/om, overall improved from 9/12. However edema in 65 vertebral body is new compared to 01/27/21. ? ?T2 hypointense material within dorasal aspect of canal t5-9 overall decreased, may reflect residual phlegmon. No discrete fluid collection remaining in thoracic spine. Previously seen canal narrowing greatest 57-8 has improved ? ?Persistent ventral epidural collection lumbar spine l3-5 concerning for residual epidural abscess, slightly decreased in size since 05/2021, though persistent severe spinal canal stenosis with compression of cauda equina roots ? ? ?Persistent edema in psoas and paraspinal muscles with no evidence intramuscular abscess ? ? ?-------- ?I called patient to continue another 3 months doxycycline, but wasn't able to contact.  ? ?2 months follow up televisit with me ? ?Patient acknowledged (got him on his wife's phone) ? ? ? ?I have attached pictures of the actual report ? ? ? ? ? ? ? ?

## 2021-09-28 ENCOUNTER — Telehealth: Payer: Self-pay

## 2021-09-28 NOTE — Telephone Encounter (Signed)
Called patient in regards to getting a two month follow up scheduled with Dr. Renold Don, can be scheduled as a virtual appointment.. No voicemail set up, and no answer.  ?

## 2021-09-29 DIAGNOSIS — M4626 Osteomyelitis of vertebra, lumbar region: Secondary | ICD-10-CM | POA: Diagnosis not present

## 2021-09-29 DIAGNOSIS — R2689 Other abnormalities of gait and mobility: Secondary | ICD-10-CM | POA: Diagnosis not present

## 2021-10-14 DIAGNOSIS — M7582 Other shoulder lesions, left shoulder: Secondary | ICD-10-CM | POA: Diagnosis not present

## 2021-10-14 DIAGNOSIS — M19012 Primary osteoarthritis, left shoulder: Secondary | ICD-10-CM | POA: Diagnosis not present

## 2021-10-17 DIAGNOSIS — G062 Extradural and subdural abscess, unspecified: Secondary | ICD-10-CM | POA: Diagnosis not present

## 2021-10-17 DIAGNOSIS — E1159 Type 2 diabetes mellitus with other circulatory complications: Secondary | ICD-10-CM | POA: Diagnosis not present

## 2021-10-17 DIAGNOSIS — G8929 Other chronic pain: Secondary | ICD-10-CM | POA: Diagnosis not present

## 2021-10-20 ENCOUNTER — Telehealth: Payer: Self-pay

## 2021-10-20 NOTE — Telephone Encounter (Signed)
Attempted to call back in regards to scheduling a two month follow up with Dr. Renold Don, voice mail has not been set up.   ?

## 2021-10-27 ENCOUNTER — Telehealth: Payer: Self-pay

## 2021-10-27 NOTE — Telephone Encounter (Signed)
Attempted to call back in regards to scheduling a two month follow up with Dr. Gale Journey, voice mail has not been set up. For the month of June  ?

## 2021-10-29 DIAGNOSIS — M4626 Osteomyelitis of vertebra, lumbar region: Secondary | ICD-10-CM | POA: Diagnosis not present

## 2021-10-29 DIAGNOSIS — R2689 Other abnormalities of gait and mobility: Secondary | ICD-10-CM | POA: Diagnosis not present

## 2021-11-08 DIAGNOSIS — G062 Extradural and subdural abscess, unspecified: Secondary | ICD-10-CM | POA: Diagnosis not present

## 2021-11-08 DIAGNOSIS — E119 Type 2 diabetes mellitus without complications: Secondary | ICD-10-CM | POA: Diagnosis not present

## 2021-11-08 DIAGNOSIS — K219 Gastro-esophageal reflux disease without esophagitis: Secondary | ICD-10-CM | POA: Diagnosis not present

## 2021-11-08 DIAGNOSIS — E785 Hyperlipidemia, unspecified: Secondary | ICD-10-CM | POA: Diagnosis not present

## 2021-11-08 DIAGNOSIS — G8929 Other chronic pain: Secondary | ICD-10-CM | POA: Diagnosis not present

## 2021-11-08 DIAGNOSIS — Z0000 Encounter for general adult medical examination without abnormal findings: Secondary | ICD-10-CM | POA: Diagnosis not present

## 2021-11-08 DIAGNOSIS — I10 Essential (primary) hypertension: Secondary | ICD-10-CM | POA: Diagnosis not present

## 2021-11-08 DIAGNOSIS — E1159 Type 2 diabetes mellitus with other circulatory complications: Secondary | ICD-10-CM | POA: Diagnosis not present

## 2021-11-29 DIAGNOSIS — R2689 Other abnormalities of gait and mobility: Secondary | ICD-10-CM | POA: Diagnosis not present

## 2021-11-29 DIAGNOSIS — M4626 Osteomyelitis of vertebra, lumbar region: Secondary | ICD-10-CM | POA: Diagnosis not present

## 2021-12-05 DIAGNOSIS — G062 Extradural and subdural abscess, unspecified: Secondary | ICD-10-CM | POA: Diagnosis not present

## 2021-12-05 DIAGNOSIS — R112 Nausea with vomiting, unspecified: Secondary | ICD-10-CM | POA: Diagnosis not present

## 2021-12-05 DIAGNOSIS — G8929 Other chronic pain: Secondary | ICD-10-CM | POA: Diagnosis not present

## 2021-12-11 IMAGING — DX DG ABD PORTABLE 1V
1 series · 1 of 1 positions shown · non-contrast
Comparison: Abdominal radiograph 01/22/2021

CLINICAL DATA: NG tube placement

EXAM:
PORTABLE ABDOMEN - 1 VIEW

[abdomen]
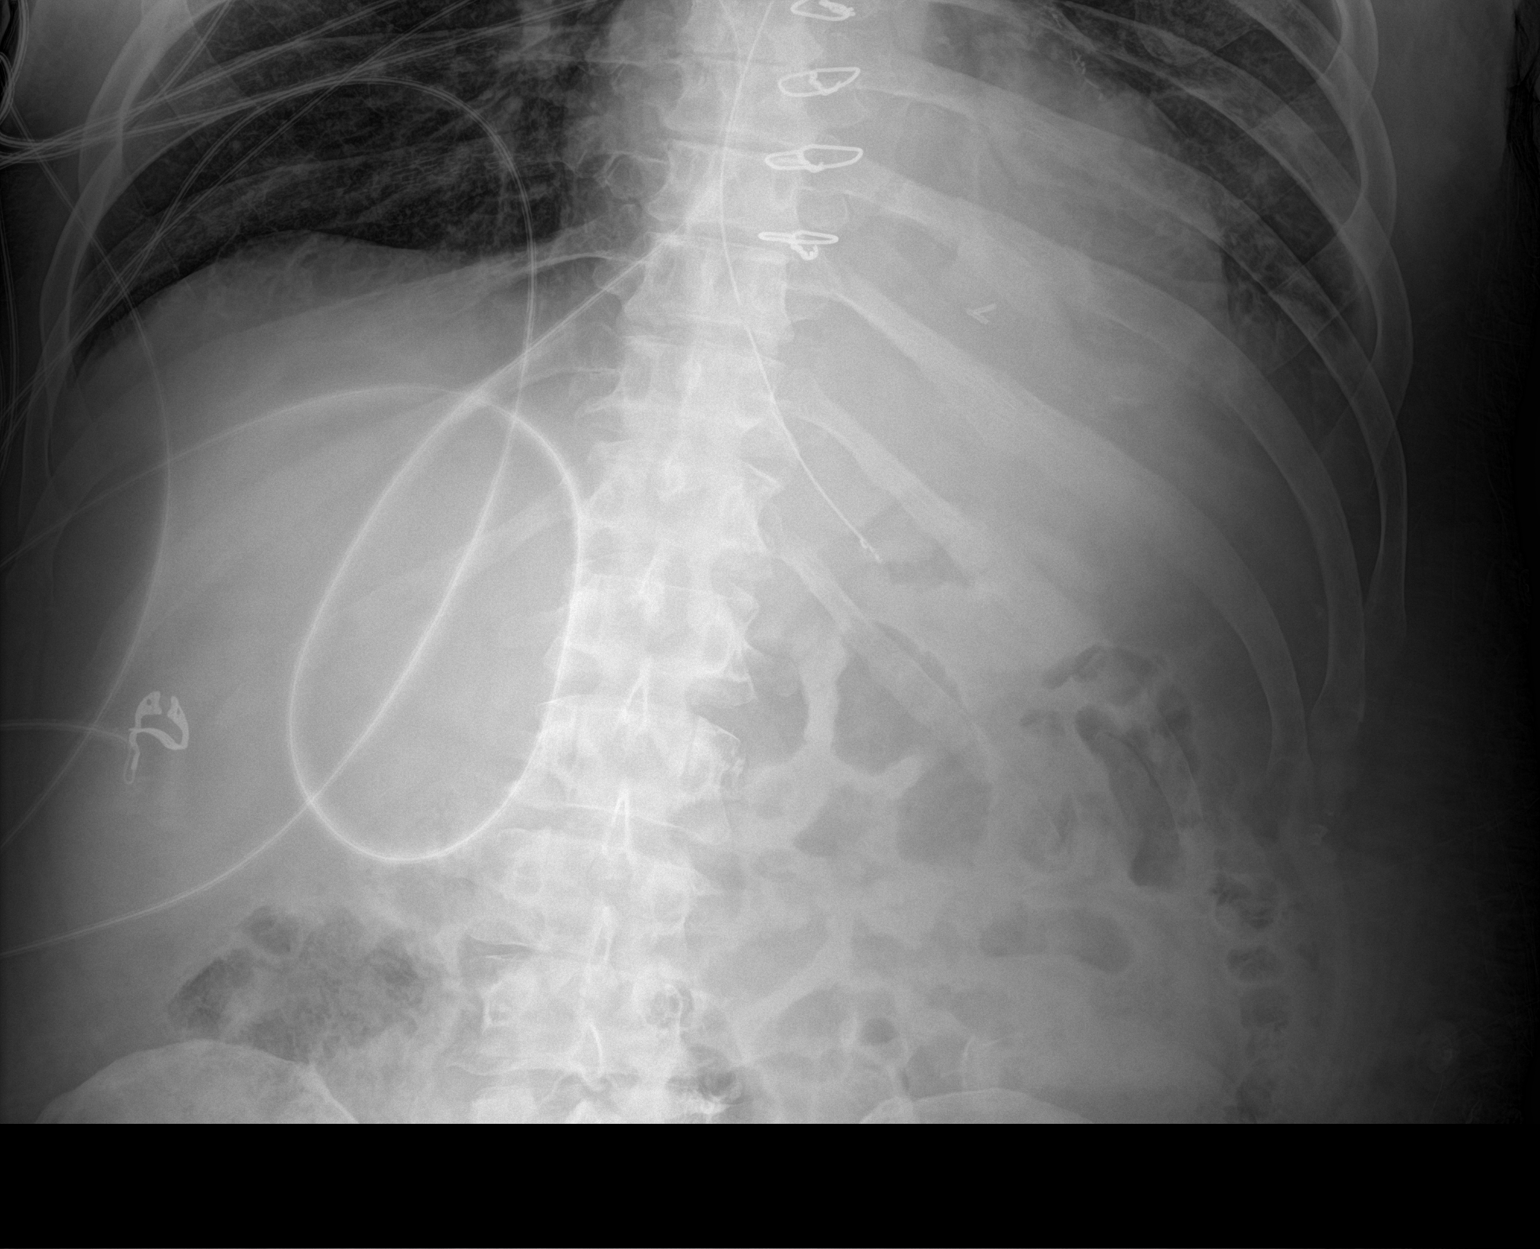

[1 of 1 positions shown; findings below may reference images not displayed]

FINDINGS: Interval placement of a nasogastric tube with side port at the
gastroesophageal junction. Bowel gas pattern is nonobstructive. Left
lung base airspace disease.
IMPRESSION: Interval placement of a nasogastric tube with side port at the
gastroesophageal junction. Recommend advancing approximately 6-7 cm
into the stomach.

## 2021-12-15 IMAGING — MR MR LUMBAR SPINE W/O CM
4 of 5 series · 28 of 48 positions shown · non-contrast
Comparison: 01/05/2021

CLINICAL DATA: Epidural abscess

EXAM:
MRI THORACIC AND LUMBAR SPINE WITHOUT CONTRAST
TECHNIQUE: Multiplanar and multiecho pulse sequences of the thoracic and lumbar
spine were obtained without intravenous contrast.

[Series 1: T2 · sagittal · 4.0mm · 0.73mm/px · 8 of 15 slices shown (1 of 2)]
[im 1/15]
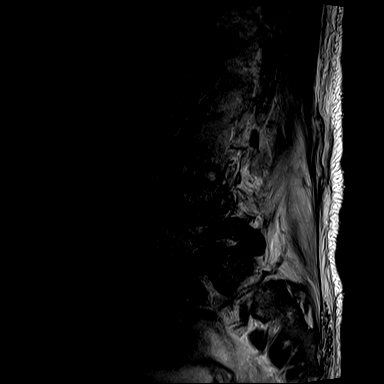
[im 3/15]
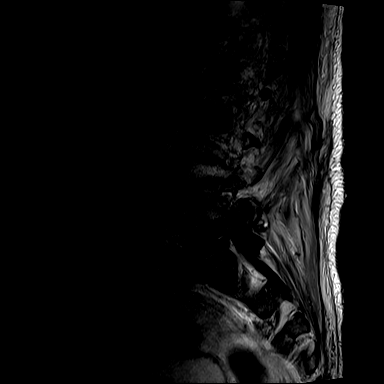
[im 5/15]
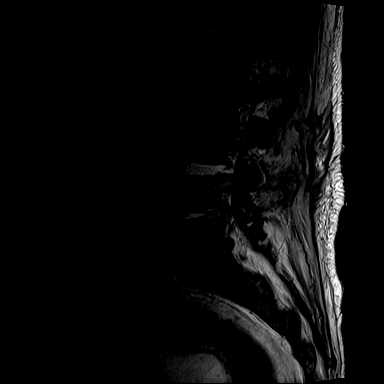
[im 7/15]
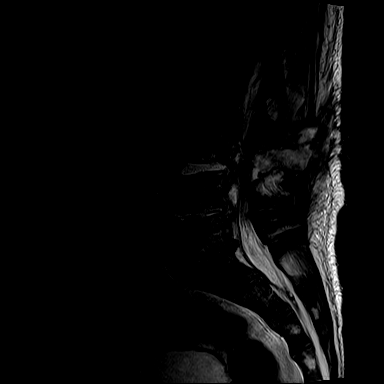
[im 9/15]
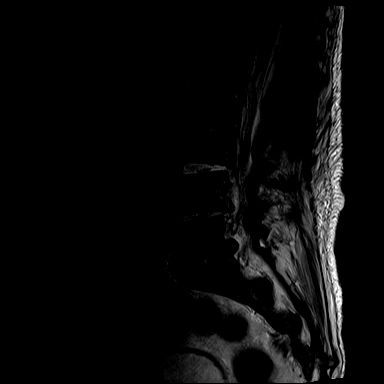
[im 11/15]
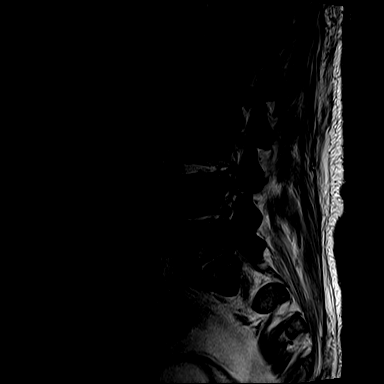
[im 13/15]
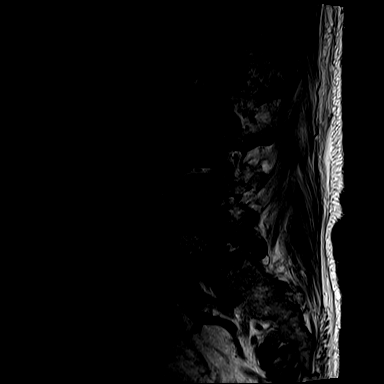
[im 15/15]
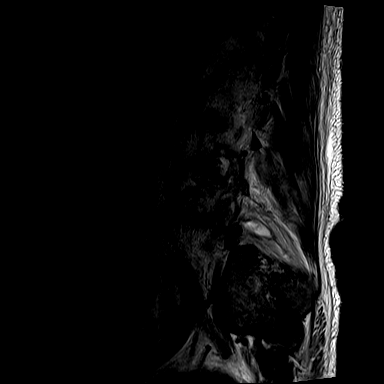

[Series 3: T1 · sagittal · 4.0mm · 0.88mm/px · 8 of 15 slices shown (1 of 2)]
[im 1/15]
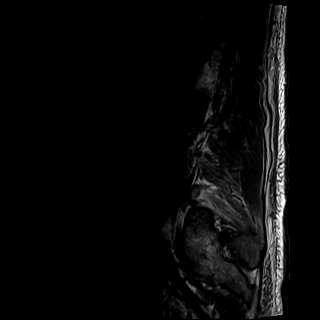
[im 3/15]
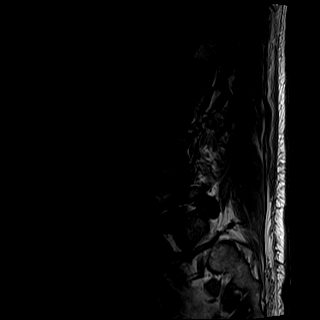
[im 5/15]
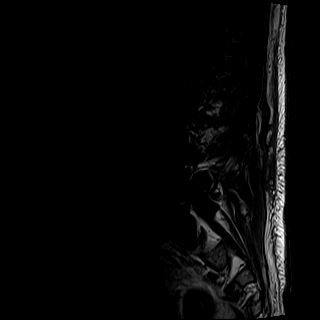
[im 7/15]
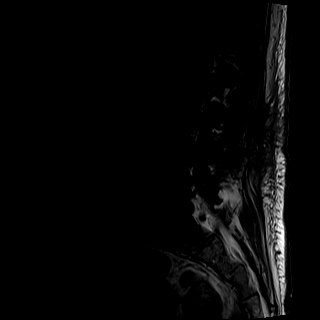
[im 9/15]
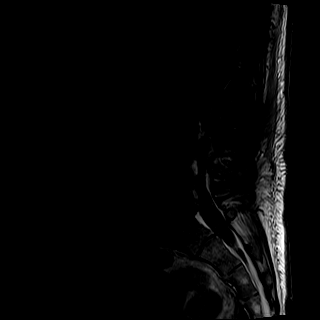
[im 11/15]
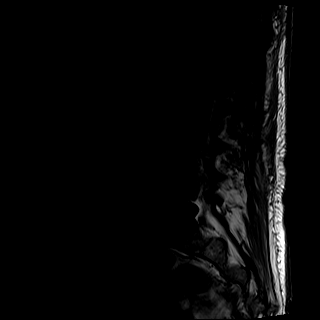
[im 13/15]
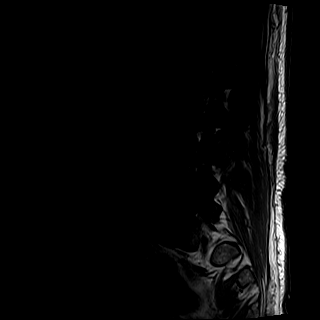
[im 15/15]
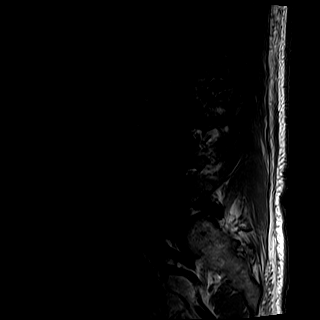

[Series 4: T2 · axial · 5.0mm · 0.57mm/px · z∈[-488,-301]mm · 9 of 24 slices shown (2 of 2)]
[im 1/24]
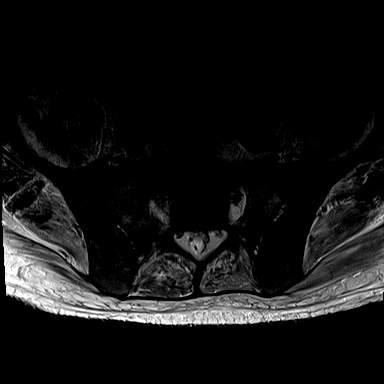
[im 5/24]
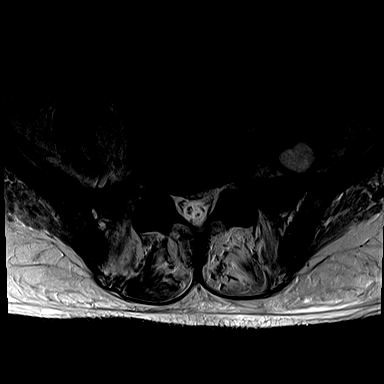
[im 7/24]
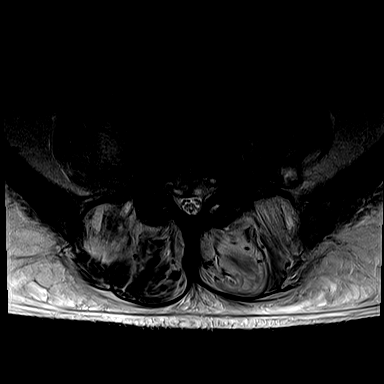
[im 11/24]
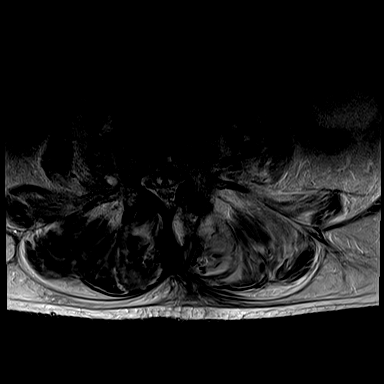
[im 13/24]
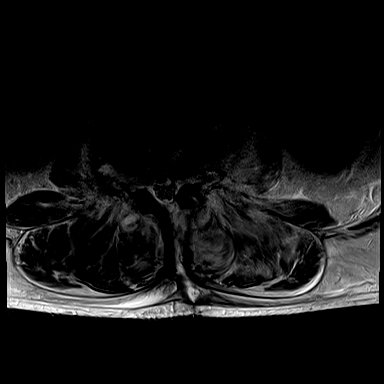
[im 17/24]
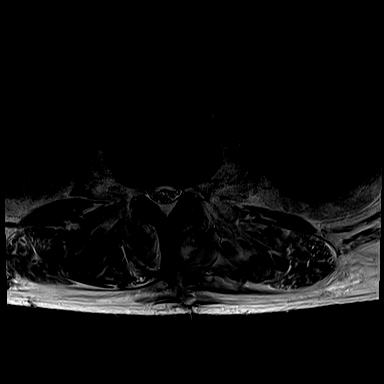
[im 19/24]
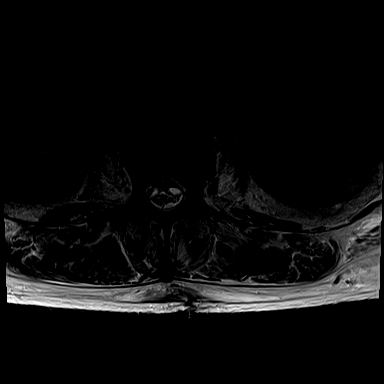
[im 21/24]
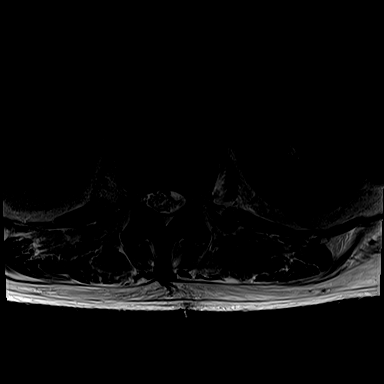
[im 24/24]
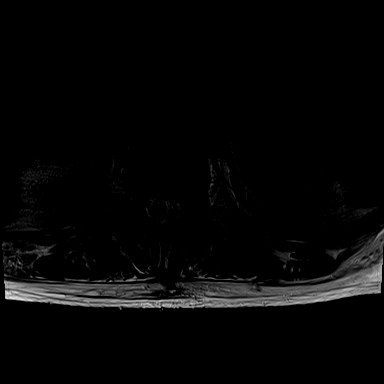

[Series 5: T1 · axial · 5.0mm · 0.34mm/px · z∈[-458,-324]mm · 3 of 24 slices shown (2 of 2)]
[im 5/24]
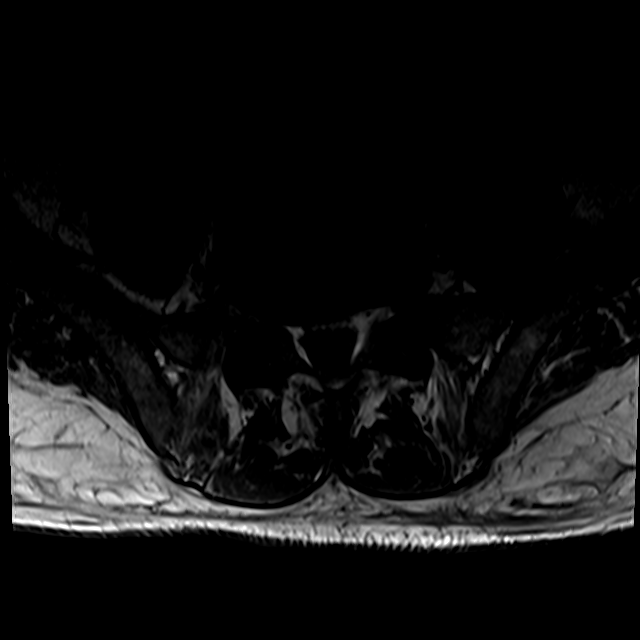
[im 13/24]
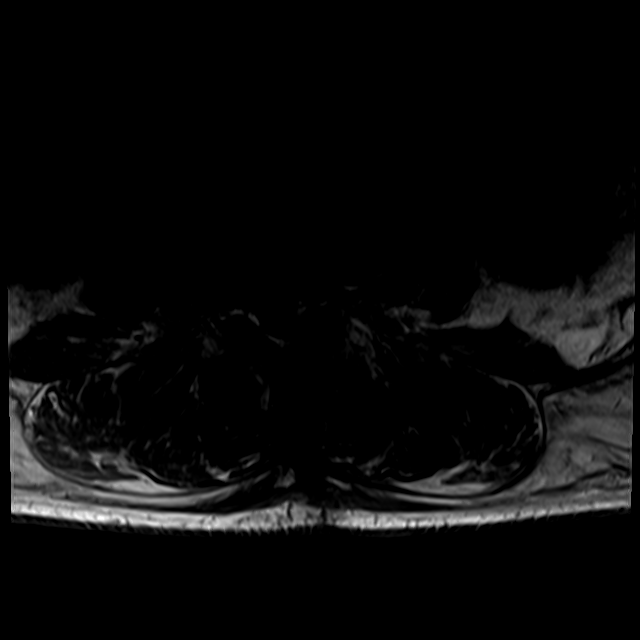
[im 21/24]
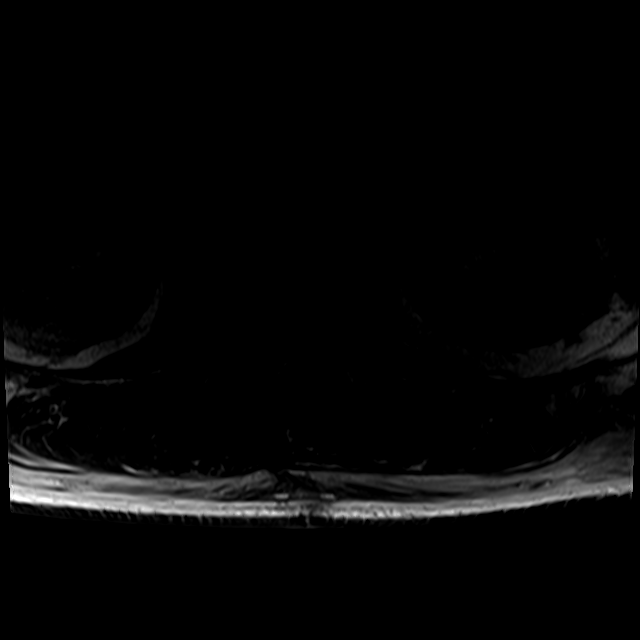

[28 of 48 positions shown; findings below may reference images not displayed]

FINDINGS: MRI THORACIC SPINE FINDINGS

Alignment:  Physiologic.

Vertebrae: Hyperintense T2-weighted signal within the T7 and T8
vertebral bodies. There is posterior decompression at the same
level.

Cord: There is mild hyperintense T2-weighted signal within the
central spinal cord at the T6-7 and T8-T10 levels, possibly a
prominent central canal. Thin dorsal epidural collection extends
from T5-6 through T8-9

Paraspinal and other soft tissues: Large pleural effusions

Disc levels:

Small disc bulge with endplate spurring at T7-8 causing mild spinal
canal stenosis. Moderate bilateral foraminal stenosis

MRI LUMBAR SPINE FINDINGS

Segmentation:  Standard.

Alignment:  Physiologic.

Vertebrae:  Slight progression of signal changes at L3-5.

Conus medullaris and cauda equina: Conus extends to the L1 level.
Ventral epidural collection at L2-3 to L5-S1, measuring 5 mm in
thickness.

Paraspinal and other soft tissues: There are multiple bilateral
psoas collections, the largest of which measures 3.4 cm on the left.
Dorsal paraspinal collection less clearly characterized without IV
contrast.

Disc levels:

L1-L2: Normal disc space and facet joints. No spinal canal stenosis.
No neural foraminal stenosis.

L2-L3: Normal disc space and facet joints. No spinal canal stenosis.
No neural foraminal stenosis.

L3-L4: Persistent disc space edema. Status post left laminectomy.
Central spinal canal narrowing due to ventral epidural collection
has progressed from the prior study. The neural foramina are patent.

L4-L5: Persistent disc space edema. Ventral epidural collection with
mild narrowing of central spinal canal. Moderate right and severe
left neural stenosis unchanged

L5-S1: Small disc bulge with ventral epidural collection. No spinal
canal stenosis. Severe right and moderate left neural foraminal
stenosis.

Visualized sacrum: Normal.
IMPRESSION: 1. Signal abnormality within the vertebral bodies and disc spaces at
L3-4 and L4-5, consistent with discitis-osteomyelitis. Persistent
ventral epidural collection at L2-S1 that measures 5 mm in
thickness.
2. T7-8 discitis-osteomyelitis with dorsal epidural collection
extending from T5-T9, likely a thin residual amount of abscess.
3. Multiple bilateral psoas abscesses.
4. Bilateral pleural effusions.
5. Moderate-to-severe bilateral neural foraminal stenosis at L4-5
and L5-S1.

## 2021-12-20 DIAGNOSIS — R109 Unspecified abdominal pain: Secondary | ICD-10-CM | POA: Diagnosis not present

## 2021-12-20 DIAGNOSIS — K5900 Constipation, unspecified: Secondary | ICD-10-CM | POA: Diagnosis not present

## 2021-12-20 DIAGNOSIS — K219 Gastro-esophageal reflux disease without esophagitis: Secondary | ICD-10-CM | POA: Diagnosis not present

## 2021-12-20 DIAGNOSIS — R110 Nausea: Secondary | ICD-10-CM | POA: Diagnosis not present

## 2021-12-26 ENCOUNTER — Other Ambulatory Visit: Payer: Self-pay

## 2021-12-28 ENCOUNTER — Telehealth: Payer: Self-pay

## 2021-12-28 DIAGNOSIS — M4620 Osteomyelitis of vertebra, site unspecified: Secondary | ICD-10-CM | POA: Diagnosis not present

## 2021-12-28 MED ORDER — DOXYCYCLINE HYCLATE 100 MG PO TABS: 100.0000 mg | ORAL_TABLET | Freq: Two times a day (BID) | ORAL | 0 refills | Status: DC

## 2021-12-28 NOTE — Telephone Encounter (Signed)
Lab orders faxed to [facility] [phone] and patient aware. Coen Miyasato T Pricilla Loveless

## 2021-12-28 NOTE — Addendum Note (Signed)
Addended by: Linna Hoff D on: 12/28/2021 08:45 AM   Modules accepted: Orders

## 2021-12-28 NOTE — Telephone Encounter (Signed)
Called patient to set up lab appointment, no answer and voicemail box not set up.   Sandie Ano, RN

## 2021-12-29 DIAGNOSIS — M4626 Osteomyelitis of vertebra, lumbar region: Secondary | ICD-10-CM | POA: Diagnosis not present

## 2021-12-29 DIAGNOSIS — R2689 Other abnormalities of gait and mobility: Secondary | ICD-10-CM | POA: Diagnosis not present

## 2021-12-30 ENCOUNTER — Other Ambulatory Visit: Payer: Self-pay

## 2021-12-30 ENCOUNTER — Ambulatory Visit (INDEPENDENT_AMBULATORY_CARE_PROVIDER_SITE_OTHER): Payer: Federal, State, Local not specified - PPO | Admitting: Internal Medicine

## 2021-12-30 DIAGNOSIS — B9562 Methicillin resistant Staphylococcus aureus infection as the cause of diseases classified elsewhere: Secondary | ICD-10-CM

## 2021-12-30 DIAGNOSIS — G062 Extradural and subdural abscess, unspecified: Secondary | ICD-10-CM

## 2021-12-30 DIAGNOSIS — M4620 Osteomyelitis of vertebra, site unspecified: Secondary | ICD-10-CM | POA: Diagnosis not present

## 2021-12-30 DIAGNOSIS — R7881 Bacteremia: Secondary | ICD-10-CM

## 2021-12-30 NOTE — Progress Notes (Signed)
[facility]  Patient Active Problem List   Diagnosis Date Noted   SIADH (syndrome of inappropriate ADH production) ([place]) 02/23/2021   Chronic diastolic congestive heart failure ([place])    Uncontrolled type 2 diabetes mellitus with hyperglycemia ([place])    Acute midline low back pain without sciatica    Pressure injury of skin 01/30/2021   Debility 01/29/2021   Protein-calorie malnutrition, severe 01/13/2021   Discitis 01/13/2021   Abnormal EKG 01/13/2021   Thrombocytosis 01/13/2021   Hypomagnesemia 01/13/2021   Hypokalemia 01/13/2021   Oral thrush 01/13/2021   GERD (gastroesophageal reflux disease) 01/13/2021   Cholestasis 01/13/2021   Normocytic anemia 01/06/2021   Hyponatremia 01/06/2021   Anxiety 01/06/2021   Chronic diastolic CHF (congestive heart failure) ([place]) 01/06/2021   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   MRN: [id number]  No chief complaint on file.  Cc -- f/u mrsa bsi and thoracolumbar epidural abscess/OM  HPI:  Louis [name] is a 56 y.o. male with dm2 here for f/o of above issues  He was discharged from hospital 8/28 He was initially admitted late 11/2020 for mrsa bacteremia with vertebral om. Course complicated by worsening vertebral epidural abscess s/p I&D 6/28. He was discharged on daptomycin but returned after a few days on 7/13 with  recurrent mrsa bacteremia, which persisted for a week. Daptomycin found to be resistant  He was switched to combination vanc/ceftaroline with sterilization of bsi. His repeat mri doesn't show any focal epidural abscess to debride. Abd/chest ct showed no other occult abscess and tee showed no endocarditis  He was switched to mono vanc after 2 weeks combo abx on 8/05.    --------- 9/08 id clinic f/u Patient remains in significant back pain with radiating sx to his legs. This is the same as during recent hospital admission, maybe a touch better He denies urinary/bowel incontinence He has no focal weakness in LE otherwise He is taking oxycodone 5 mg bid and gabapentin and tramadol prn for pain at a time No constipation/diarrhea No rash No f/c   10/07 id clinic f/u Patient saw dr Dawley after my last visit. Nothing special is planned. Dr Reatha Armour will graciously continue to follow and review any back mri/imaging ordered Patient reports sweating at night drenching since the beginning of this infection No actual fever No rash No diarrhea He remains on vancomycin with HH  I still do not have labs at all in [facility]/from opat team (who is in our clinic and receives any faxed labs)/epic.  Pain is still severe. He'll be seeing the pain clinic next week. He currently takes oxycodone three times a done, gabapentin also three times a day, and tramadol, and preivously mobic  His PCP is at [place], [place] where he sees often for medical care  11/08 id clinic f/u This is a televideo visit at his request as his back pain is moderate-severe  and he lives 2 hours away I verified that I was speaking with the correct person using two identifiers.  The patient was located at home. The provider was located in the office. The patient did consent to this visit and is aware of charges through their insurance as well as the limitations of evaluation and management by telemedicine. Other persons participating in  this telemedicine service include his wife  We reviewed ct of his lumbar spine again He transitioned from IV vancomycin to doxycycline on 10/26. Tolerating oral abx His functional status and back pain remains same No n/v/diarrhea/rash No f/c No numbness/tingling in bilateral LE or incontinence He'll see dr Dawley tomorrow for nsg follow up. I advised him to discuss potential back brace for comfort and PT/OT He walks with FWW and mostly confined to the house. He doesn't appear depressed or frustrated  05/31/2021 id clinic f/u Patient continues to have moderate-severe pain which is the same Nothing had changed. He continues to use FWW to get around in the house No f/c/n/v/diarrhea We had continued doxy and planned mri T and L spine which are being arranged by our clinic staff He is to see pain clinic for the first time next week He had labs 11/23 cbc 9/14/346; cr 0.73; no crp done  07/04/21 id clinic visit Pretty much same sx since last visit. No new neurologic sx Continues on doxy Reviewed mri result again thoracolumbar spine No n/v/diarrhea/rash Havent' seen complex pain clinic as he had some stomach bug issue; next new visit end of 06/2021 Walking around house and minimally outside with FWW He is frustrated but not depressed/suicidal   2/28 id clinic visit Tolerating doxy Stable back sx Asks about when to stop  No n/d/rash No f/c  12/30/21 id clinic f/u Louis [name] has mri done 09/2021 and there was still sign concerning for active infection so we continued 3 more months of doxy His last crp 7/05 was normal  He has been on abx for about year His back his hurting "pretty good." He had a couple a fall. The furthest he has been able to get to the mail box. Some small improvement. Trouble with pretty much any position for too long  He has been throwing up "a lot". He has been doing this since hospital release a year before. He saw gi a week ago and they suggest antibiotics could  contribute.  He has a follow up in 6 weeks if not better for endoscopy if not better  Patient not loosing weight  Patient doesn't have an appetite  No fever, chill, nightsweat   He sees his regular doctor for pain control... oxycodone, gabapentin, tylenol.    Patient has one more month of doxycycline.      No Known Allergies    Outpatient Medications Prior to Visit  Medication Sig Dispense Refill   acetaminophen (TYLENOL) 325 MG tablet Take 1-2 tablets (325-650 mg total) by mouth every 4 (four) hours as needed for mild pain.     amLODipine (NORVASC) 10 MG tablet Take 1 tablet (10 mg total) by mouth daily. 30 tablet 0   aspirin EC 81 MG tablet Take 81 mg by mouth daily. Swallow whole.     blood glucose meter kit and supplies KIT Dispense based on patient and insurance preference. Use up to four times daily as directed. 1 each 0   diclofenac Sodium (VOLTAREN) 1 % GEL Apply 2 g topically 4 (four) times daily as needed (pain). 350 g 0  doxycycline (VIBRA-TABS) 100 MG tablet Take 1 tablet (100 mg total) by mouth 2 (two) times daily. 60 tablet 0   folic acid (FOLVITE) 1 MG tablet Take 1 tablet (1 mg total) by mouth daily. 30 tablet 0   furosemide (LASIX) 20 MG tablet Take 1 tablet (20 mg total) by mouth daily. 30 tablet 0   gabapentin (NEURONTIN) 100 MG capsule Take 1 capsule (100 mg total) by mouth 2 (two) times daily. 60 capsule 0   gabapentin (NEURONTIN) 300 MG capsule Take 1 capsule (300 mg total) by mouth 3 (three) times daily. 90 capsule 0   hydrocortisone cream 0.5 % Apply topically 2 (two) times daily. 30 g 0   hydrOXYzine (ATARAX/VISTARIL) 10 MG tablet Take 1 tablet (10 mg total) by mouth 3 (three) times daily as needed for anxiety, nausea or vomiting. 75 tablet 0   insulin aspart (NOVOLOG) 100 UNIT/ML FlexPen Inject 4 Units into the skin 3 (three) times daily with meals. 15 mL 1   Insulin Glargine (BASAGLAR KWIKPEN) 100 UNIT/ML Inject 15 Units into the skin daily. 15 mL 1    Insulin Pen Needle (PEN NEEDLES) 32G X 6 MM MISC 1 application by Does not apply route 2 (two) times daily. 100 each 1   iron polysaccharides (NIFEREX) 150 MG capsule Take 1 capsule (150 mg total) by mouth daily. 30 capsule 0   losartan (COZAAR) 25 MG tablet Take 0.5 tablets (12.5 mg total) by mouth every morning. 30 tablet 0   melatonin 3 MG TABS tablet Take 1 tablet (3 mg total) by mouth at bedtime. 30 tablet 0   methocarbamol (ROBAXIN) 500 MG tablet Take 1 tablet (500 mg total) by mouth every 6 (six) hours as needed for muscle spasms. 120 tablet 0   methocarbamol (ROBAXIN) 500 MG tablet Take 1 tablet (500 mg total) by mouth 4 (four) times daily.     metoprolol succinate (TOPROL-XL) 25 MG 24 hr tablet Take 1 tablet (25 mg total) by mouth daily. 30 tablet 0   Multiple Vitamin (MULTIVITAMIN WITH MINERALS) TABS tablet Take 1 tablet by mouth daily.     nutrition supplement, JUVEN, (JUVEN) PACK Take 1 packet by mouth 3 (three) times daily between meals. 90 packet 0   ondansetron (ZOFRAN) 4 MG tablet Take 1 tablet (4 mg total) by mouth every 6 (six) hours as needed for nausea. 20 tablet 0   oxyCODONE (OXY IR/ROXICODONE) 5 MG immediate release tablet Take 1 tablet (5 mg total) by mouth every 12 (twelve) hours as needed for severe pain. 10 tablet 0   pantoprazole (PROTONIX) 40 MG tablet Take 1 tablet (40 mg total) by mouth 2 (two) times daily. 60 tablet 0   psyllium (HYDROCIL/METAMUCIL) 95 % PACK Take 1 packet by mouth daily. 240 each    traMADol (ULTRAM) 50 MG tablet Take 1 tablet (50 mg total) by mouth every 6 (six) hours as needed for moderate pain (use for pain rated 4-6/10). 28 tablet 0   No facility-administered medications prior to visit.     Social History   Socioeconomic History   Marital status: Married    Spouse name: Brookes Craine   Number of children: 1   Years of education: Not on file   Highest education level: Not on file  Occupational History   Occupation: Postal delivery   Tobacco Use   Smoking status: Every Day    Packs/day: 2.00    Types: Cigarettes    Start date: 06/28/1995   Smokeless tobacco: Never  Vaping Use   Vaping Use: Never used  Substance and Sexual Activity   Alcohol use: Not Currently   Drug use: Never   Sexual activity: Not Currently  Other Topics Concern   Not on file  Social History Narrative   Not on file   Social Determinants of Health   Financial Resource Strain: Not on file  Food Insecurity: Not on file  Transportation Needs: Not on file  Physical Activity: Not on file  Stress: Not on file  Social Connections: Not on file  Intimate Partner Violence: Not on file      Review of Systems    All other ros negative   Objective:    There were no vitals taken for this visit. Nursing note and vital signs reviewed.  Physical Exam  This is a telephone visit   Labs: Lab Results  Component Value Date   WBC 5.2 02/14/2021   HGB 7.4 (L) 02/14/2021   HCT 22.9 (L) 02/14/2021   MCV 83.3 02/14/2021   PLT 440 (H) 90/30/0923   Last metabolic panel Outside lab (with pcp) 12/28/21  cbc 8/15/341; cr 0.87; lft 11/12/130/0.5 05/18/21 cbc 7/14/346; cr 0.73; lft 12/11/167/0.2  Crp: 07/05   <5 (<5) 12/02   6.4 (<5); esr 35 (<20) 10/09 32.7 (<5) 9/26  25.7 (<5) 8/23    5 (<1) 8/17    7.6  7/30    7.2 7/14   31  Micro:  Serology:  Imaging: 09/22/2021 mri t and l spine Mri noncontrast t and l spine 3/31 from [facility]  Edema in t6-8 and l3-5 vertebral bodies c/w sequela of discitis/om, overall improved from 9/12. However edema in 65 vertebral body is new compared to 01/27/21.  T2 hypointense material within dorasal aspect of canal t5-9 overall decreased, may reflect residual phlegmon. No discrete fluid collection remaining in thoracic spine. Previously seen canal narrowing greatest 57-8 has improved  Persistent ventral epidural collection lumbar spine l3-5 concerning for residual epidural abscess, slightly decreased in  size since 05/2021, though persistent severe spinal canal stenosis with compression of cauda equina roots   Persistent edema in psoas and paraspinal muscles with no evidence intramuscular abscess              10/24 ct thoracic lumbar spine (outside facility) 1) endplate erosive change and heterogeneous sclerosis/lucency at t7-t3, l3-l4 and l4-l5 compatible with known discitis osteomyelitis. No progressive vertebral body height loss. Surrounding paraspinal cellulitis/phlegmon at these levels. Comparision limited due to different modalities with prior mri  2) limited assessment of canal by ct, however, suggestion ofsoft tissue thickening within ventral canal l4-l5 concerning for residual epidural collection  3) multilevel superimposed degenerative change in thoracic/lumbar spine  9/12 mr lumbar thoracic spine 1. Persistent findings of discitis/osteomyelitis at T7, T8, and L3 through L5. 2. Persistent epidural collection in the thoracolumbar spine concerning for residual epidural abscess which appears slightly increased in size since the prior study. Specifically, canal patency at L3-L4 and L4-L5 has worsened, with severe canal stenosis and compression of the cauda equina nerve roots. 3. Moderate spinal canal stenosis without evidence of cord compression at T7-T8 due to the persistent epidural collection is overall not significantly changed. 4. Persistent left pleural effusion and resolved right pleural effusion. 5. Persistent edema throughout the paraspinal and psoas muscles with resolved psoas abscesses.  7/19 tee No valvular vegetation   7/20 ct abd pelv with contrast Progressive erosive changes involving the L3 and L4 vertebral bodies in keeping with progressive changes of discitis  osteomyelitis. No pathologic fracture. Increasing paraspinal inflammatory collections adjacent to L3-L5 bilaterally without discrete drainable fluid at this time.   Progressive anasarca  with development of pleural effusions, mild ascites, and moderate diffuse subcutaneous body wall edema.   Aortic Atherosclerosis   Assessment & Plan:   Problem List Items Addressed This Visit   None No orders of the defined types were placed in this encounter.    CC: Mrsa bacteremia Thoracolumbar epidural abscess/osteomyelitis (t5-s1) paraspinal myositis Psoas abscess (resolved on 9/12 mr thoracolumbar spine)   Abx: 7/14-20; 7/21-c vanc --> doxycycline  7/20-8/05 ceftaroline Previously daptomycin   ASSESSMENT: 56 yo male recent mrsa bacteremia/epidural abscess/OM and psoas abscess s/p open debridement 6/28 and IR drainage of left psoas discharged on daptomycin, now readmitted 7/13 for worsening back pain of a few days found to have recurrent mrsa bacteremia   7/13 & 7/15 & 7/17 & 7/19 & 7/20 bcx mrsa  7/22, 24, 26 bcx negative   7/13 mri lumbar/thoracic showed several areas of fluid collection with a more loculated focus at L4-5. There is no focal LE neurologic deficit or urinary/bowel incontinence. NSG reviewed case and felt no urgent decompression at this time   7/16 IR reviewed case and percutaneously drained the 5 cm L3 level subdural fluid collection.   7/17 tte no vegetation 7/19 tee no vegetation 7/20 abd/pelv ct with contrast no occult abscess   Wbc improving on vanc, but persistent bacteremia switched to combination abx therapy 7/20 dapto/ceftaroline --> vanc/ceftaroline on 7/21 (dapto resistant on sensitivity testing 7/13)   Repeat mri no drainable discrete abscess. Discussed with dr Dawley of nsg on 7/21.    As blood cx sterilized on combination therapy, switched back to monotherapy vancomycin on 8/05 with plan to do at least 6 weeks  08/23/2021 Patient has had more than 6 months of appropriate abx -- transitioned from iv to doxy 04/20/2021 Continues to have persistent moderate-severe pain Seeing pain clinic Paying out of pocket for mri, so we are operating  rather blind on duration of doxy  -will plan to repeat mri in another 4-6 weeks -continue doxy for now -crp/cbc/cmp labs to be faxed to local office for him -televisit appointment in 8 weeks (2 weeks after mri)  12/30/2021 assessment His mobility is slowly improving He is still very debilitated in terms of back pain He takes oxycodone but doesn't see a pain clinic. I discussed multiple long term adverse effect of narcotics including depression immune system, disturbing endocrine system, disturbing GI autonomic nervous system, and advise him to seek pain manangement to get off narcotics  I also advise him to actually push through in terms of physical therapy beyond even if he has pain with activities  For now the alkphos is still high but improving. While immobility can do this we have to attribute this to infection. His crp is down normal for the first time as of 12/2021. His last mri doesn't have any frank abscess from 08/2021  I would finish 1 more month doxycycline which he have and see how he does off abx   Televisit 7-8 weeks    Follow-up: No follow-ups on file.    I verified that I was speaking with the correct person using two identifiers. Due to the COVID-19 Pandemic/patient's location, this service was provided via telemedicine using audio/visual media.   The patient was located at home. The provider was located in the office. The patient did consent to this visit and is aware of charges through their insurance  as well as the limitations of evaluation and management by telemedicine. Other persons participating in this telemedicine service were none. Time spent on visit was greater than 30 minutes on media and in coordination of care   Jabier Mutton, [place] for [facility] [phone]  pager   [phone] cell 12/30/2021, 4:32 PM

## 2022-01-02 ENCOUNTER — Telehealth: Payer: Self-pay

## 2022-01-02 NOTE — Telephone Encounter (Signed)
Patient scheduled; Diminique spoke with patient's wife and made them aware of appointment.  Wyvonne Lenz, RN

## 2022-01-02 NOTE — Telephone Encounter (Signed)
Attempted to call patient to schedule follow up visit in 7-8 weeks per Dr. Renold Don. No answer. Voicemail not set up. Will send MyChart message.  Wyvonne Lenz, RN

## 2022-02-28 ENCOUNTER — Encounter: Payer: Self-pay | Admitting: Internal Medicine

## 2022-02-28 ENCOUNTER — Telehealth (INDEPENDENT_AMBULATORY_CARE_PROVIDER_SITE_OTHER): Payer: Federal, State, Local not specified - PPO | Admitting: Internal Medicine

## 2022-02-28 ENCOUNTER — Other Ambulatory Visit: Payer: Self-pay

## 2022-02-28 DIAGNOSIS — B9562 Methicillin resistant Staphylococcus aureus infection as the cause of diseases classified elsewhere: Secondary | ICD-10-CM

## 2022-02-28 DIAGNOSIS — M4624 Osteomyelitis of vertebra, thoracic region: Secondary | ICD-10-CM | POA: Diagnosis not present

## 2022-02-28 DIAGNOSIS — K6812 Psoas muscle abscess: Secondary | ICD-10-CM

## 2022-02-28 DIAGNOSIS — G061 Intraspinal abscess and granuloma: Secondary | ICD-10-CM

## 2022-02-28 DIAGNOSIS — M609 Myositis, unspecified: Secondary | ICD-10-CM

## 2022-02-28 NOTE — Progress Notes (Signed)
[name] for [facility]ious Disease  Patient Active Problem List   Diagnosis Date Noted   SIADH (syndrome of inappropriate ADH production) ([place]) 02/23/2021   Chronic diastolic congestive heart failure ([place])    Uncontrolled type 2 diabetes mellitus with hyperglycemia ([place])    Acute midline low back pain without sciatica    Pressure injury of skin 01/30/2021   Debility 01/29/2021   Protein-calorie malnutrition, severe 01/13/2021   Discitis 01/13/2021   Abnormal EKG 01/13/2021   Thrombocytosis 01/13/2021   Hypomagnesemia 01/13/2021   Hypokalemia 01/13/2021   Oral thrush 01/13/2021   GERD (gastroesophageal reflux disease) 01/13/2021   Cholestasis 01/13/2021   Normocytic anemia 01/06/2021   Hyponatremia 01/06/2021   Anxiety 01/06/2021   Chronic diastolic CHF (congestive heart failure) ([place]) 01/06/2021   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   MRN: [id number]  Chief Complaint  Patient presents with   Follow-up    Cc -- f/u mrsa bsi and thoracolumbar epidural abscess/OM  HPI:  Louis [name] is a 56 y.o. male with dm2 here for f/o of above issues  He was discharged from hospital 8/28 He was initially admitted late 11/2020 for mrsa bacteremia with vertebral om. Course complicated by worsening vertebral epidural abscess s/p I&D 6/28. He was discharged on daptomycin but returned after  a few days on 7/13 with recurrent mrsa bacteremia, which persisted for a week. Daptomycin found to be resistant  He was switched to combination vanc/ceftaroline with sterilization of bsi. His repeat mri doesn't show any focal epidural abscess to debride. Abd/chest ct showed no other occult abscess and tee showed no endocarditis  He was switched to mono vanc after 2 weeks combo abx on 8/05.    --------- 9/08 id clinic f/u Patient remains in significant back pain with radiating sx to his legs. This is the same as during recent hospital admission, maybe a touch better He denies urinary/bowel incontinence He has no focal weakness in LE otherwise He is taking oxycodone 5 mg bid and gabapentin and tramadol prn for pain at a time No constipation/diarrhea No rash No f/c   10/07 id clinic f/u Patient saw dr Dawley after my last visit. Nothing special is planned. Dr Reatha Armour will graciously continue to follow and review any back mri/imaging ordered Patient reports sweating at night drenching since the beginning of this infection No actual fever No rash No diarrhea He remains on vancomycin with HH  I still do not have labs at all in [facility]/from opat team (who is in our clinic and receives any faxed labs)/epic.  Pain is still severe. He'll be seeing the pain clinic next week. He currently takes oxycodone three times a done, gabapentin also three times a day, and tramadol, and preivously mobic  His PCP is at [place], [place] where he sees often for medical care  11/08 id clinic f/u This is a televideo visit at his request  as his back pain is moderate-severe and he lives 2 hours away I verified that I was speaking with the correct person using two identifiers.  The patient was located at home. The provider was located in the office. The patient did consent to this visit and is aware of charges through their insurance as well as the limitations of evaluation and management by telemedicine. Other  persons participating in this telemedicine service include his wife  We reviewed ct of his lumbar spine again He transitioned from IV vancomycin to doxycycline on 10/26. Tolerating oral abx His functional status and back pain remains same No n/v/diarrhea/rash No f/c No numbness/tingling in bilateral LE or incontinence He'll see dr Dawley tomorrow for nsg follow up. I advised him to discuss potential back brace for comfort and PT/OT He walks with FWW and mostly confined to the house. He doesn't appear depressed or frustrated  05/31/2021 id clinic f/u Patient continues to have moderate-severe pain which is the same Nothing had changed. He continues to use FWW to get around in the house No f/c/n/v/diarrhea We had continued doxy and planned mri T and L spine which are being arranged by our clinic staff He is to see pain clinic for the first time next week He had labs 11/23 cbc 9/14/346; cr 0.73; no crp done  07/04/21 id clinic visit Pretty much same sx since last visit. No new neurologic sx Continues on doxy Reviewed mri result again thoracolumbar spine No n/v/diarrhea/rash Havent' seen complex pain clinic as he had some stomach bug issue; next new visit end of 06/2021 Walking around house and minimally outside with [place] He is frustrated but not depressed/suicidal   2/28 id clinic visit Tolerating doxy Stable back sx Asks about when to stop  No n/d/rash No f/c  12/30/21 id clinic f/u Louis [name] has mri done 09/2021 and there was still sign concerning for active infection so we continued 3 more months of doxy His last crp 7/05 was normal  He has been on abx for about year His back his hurting "pretty good." He had a couple a fall. The furthest he has been able to get to the mail box. Some small improvement. Trouble with pretty much any position for too long  He has been throwing up "a lot". He has been doing this since hospital release a year before. He saw gi a week ago and they suggest  antibiotics could contribute.  He has a follow up in 6 weeks if not better for endoscopy if not better  Patient not loosing weight  Patient doesn't have an appetite  No fever, chill, nightsweat   He sees his regular doctor for pain control... oxycodone, gabapentin, tylenol.    Patient has one more month of doxycycline.     02/28/22 id clinic f/u Patient finished doxy about 3-4 weeks ago Continues to have stable pain no fever/chill/malaise since abx finished No other complaint     No Known Allergies    Outpatient Medications Prior to Visit  Medication Sig Dispense Refill   ondansetron (ZOFRAN) 4 MG tablet Take 1 tablet (4 mg total) by mouth every 6 (six) hours as needed for nausea. 20 tablet 0   oxyCODONE (OXY IR/ROXICODONE) 5 MG immediate release tablet Take 1 tablet (5 mg total) by mouth every 12 (twelve) hours as needed for severe pain. 10 tablet 0   acetaminophen (TYLENOL) 325 MG tablet Take 1-2 tablets (325-650 mg total) by mouth every 4 (four) hours as needed for mild pain.  amLODipine (NORVASC) 10 MG tablet Take 1 tablet (10 mg total) by mouth daily. 30 tablet 0   aspirin EC 81 MG tablet Take 81 mg by mouth daily. Swallow whole.     blood glucose meter kit and supplies KIT Dispense based on patient and insurance preference. Use up to four times daily as directed. 1 each 0   diclofenac Sodium (VOLTAREN) 1 % GEL Apply 2 g topically 4 (four) times daily as needed (pain). 350 g 0   doxycycline (VIBRA-TABS) 100 MG tablet Take 1 tablet (100 mg total) by mouth 2 (two) times daily. (Patient not taking: Reported on 02/28/2022) 60 tablet 0   folic acid (FOLVITE) 1 MG tablet Take 1 tablet (1 mg total) by mouth daily. 30 tablet 0   furosemide (LASIX) 20 MG tablet Take 1 tablet (20 mg total) by mouth daily. 30 tablet 0   gabapentin (NEURONTIN) 100 MG capsule Take 1 capsule (100 mg total) by mouth 2 (two) times daily. 60 capsule 0   gabapentin (NEURONTIN) 300 MG capsule Take 1  capsule (300 mg total) by mouth 3 (three) times daily. 90 capsule 0   hydrocortisone cream 0.5 % Apply topically 2 (two) times daily. 30 g 0   hydrOXYzine (ATARAX/VISTARIL) 10 MG tablet Take 1 tablet (10 mg total) by mouth 3 (three) times daily as needed for anxiety, nausea or vomiting. 75 tablet 0   insulin aspart (NOVOLOG) 100 UNIT/ML FlexPen Inject 4 Units into the skin 3 (three) times daily with meals. 15 mL 1   Insulin Glargine (BASAGLAR KWIKPEN) 100 UNIT/ML Inject 15 Units into the skin daily. 15 mL 1   Insulin Pen Needle (PEN NEEDLES) 32G X 6 MM MISC 1 application by Does not apply route 2 (two) times daily. 100 each 1   iron polysaccharides (NIFEREX) 150 MG capsule Take 1 capsule (150 mg total) by mouth daily. 30 capsule 0   losartan (COZAAR) 25 MG tablet Take 0.5 tablets (12.5 mg total) by mouth every morning. 30 tablet 0   melatonin 3 MG TABS tablet Take 1 tablet (3 mg total) by mouth at bedtime. 30 tablet 0   methocarbamol (ROBAXIN) 500 MG tablet Take 1 tablet (500 mg total) by mouth every 6 (six) hours as needed for muscle spasms. 120 tablet 0   methocarbamol (ROBAXIN) 500 MG tablet Take 1 tablet (500 mg total) by mouth 4 (four) times daily.     metoprolol succinate (TOPROL-XL) 25 MG 24 hr tablet Take 1 tablet (25 mg total) by mouth daily. 30 tablet 0   Multiple Vitamin (MULTIVITAMIN WITH MINERALS) TABS tablet Take 1 tablet by mouth daily.     nutrition supplement, JUVEN, (JUVEN) PACK Take 1 packet by mouth 3 (three) times daily between meals. 90 packet 0   pantoprazole (PROTONIX) 40 MG tablet Take 1 tablet (40 mg total) by mouth 2 (two) times daily. 60 tablet 0   psyllium (HYDROCIL/METAMUCIL) 95 % PACK Take 1 packet by mouth daily. 240 each    traMADol (ULTRAM) 50 MG tablet Take 1 tablet (50 mg total) by mouth every 6 (six) hours as needed for moderate pain (use for pain rated 4-6/10). 28 tablet 0   No facility-administered medications prior to visit.     Social History    Socioeconomic History   Marital status: Married    Spouse name: Masaji Billups   Number of children: 1   Years of education: Not on file   Highest education level: Not on file  Occupational History  Occupation: Postal delivery  Tobacco Use   Smoking status: Every Day    Packs/day: 2.00    Types: Cigarettes    Start date: 06/28/1995   Smokeless tobacco: Never  Vaping Use   Vaping Use: Never used  Substance and Sexual Activity   Alcohol use: Not Currently   Drug use: Never   Sexual activity: Not Currently  Other Topics Concern   Not on file  Social History Narrative   Not on file   Social Determinants of Health   Financial Resource Strain: Not on file  Food Insecurity: Not on file  Transportation Needs: Not on file  Physical Activity: Not on file  Stress: Not on file  Social Connections: Not on file  Intimate Partner Violence: Not on file      Review of Systems    All other ros negative   Objective:    There were no vitals taken for this visit. Nursing note and vital signs reviewed.  Physical Exam  This is a telephone visit   Labs: Lab Results  Component Value Date   WBC 5.2 02/14/2021   HGB 7.4 (L) 02/14/2021   HCT 22.9 (L) 02/14/2021   MCV 83.3 02/14/2021   PLT 440 (H) 93/26/7124   Last metabolic panel Outside lab (with pcp) 12/28/21  cbc 8/15/341; cr 0.87; lft 11/12/130/0.5 05/18/21 cbc 7/14/346; cr 0.73; lft 12/11/167/0.2  Crp: 07/05   <5 (<5) 12/02   6.4 (<5); esr 35 (<20) 10/09 32.7 (<5) 9/26  25.7 (<5) 8/23    5 (<1) 8/17    7.6  7/30    7.2 7/14   31  Micro:  Serology:  Imaging: 09/22/2021 mri t and l spine Mri noncontrast t and l spine 3/31 from [facility]  Edema in t6-8 and l3-5 vertebral bodies c/w sequela of discitis/om, overall improved from 9/12. However edema in 65 vertebral body is new compared to 01/27/21.  T2 hypointense material within dorasal aspect of canal t5-9 overall decreased, may reflect residual phlegmon. No  discrete fluid collection remaining in thoracic spine. Previously seen canal narrowing greatest 57-8 has improved  Persistent ventral epidural collection lumbar spine l3-5 concerning for residual epidural abscess, slightly decreased in size since 05/2021, though persistent severe spinal canal stenosis with compression of cauda equina roots   Persistent edema in psoas and paraspinal muscles with no evidence intramuscular abscess              10/24 ct thoracic lumbar spine (outside facility) 1) endplate erosive change and heterogeneous sclerosis/lucency at t7-t3, l3-l4 and l4-l5 compatible with known discitis osteomyelitis. No progressive vertebral body height loss. Surrounding paraspinal cellulitis/phlegmon at these levels. Comparision limited due to different modalities with prior mri  2) limited assessment of canal by ct, however, suggestion ofsoft tissue thickening within ventral canal l4-l5 concerning for residual epidural collection  3) multilevel superimposed degenerative change in thoracic/lumbar spine  9/12 mr lumbar thoracic spine 1. Persistent findings of discitis/osteomyelitis at T7, T8, and L3 through L5. 2. Persistent epidural collection in the thoracolumbar spine concerning for residual epidural abscess which appears slightly increased in size since the prior study. Specifically, canal patency at L3-L4 and L4-L5 has worsened, with severe canal stenosis and compression of the cauda equina nerve roots. 3. Moderate spinal canal stenosis without evidence of cord compression at T7-T8 due to the persistent epidural collection is overall not significantly changed. 4. Persistent left pleural effusion and resolved right pleural effusion. 5. Persistent edema throughout the paraspinal and psoas muscles with  resolved psoas abscesses.  7/19 tee No valvular vegetation   7/20 ct abd pelv with contrast Progressive erosive changes involving the L3 and L4 vertebral bodies in  keeping with progressive changes of discitis osteomyelitis. No pathologic fracture. Increasing paraspinal inflammatory collections adjacent to L3-L5 bilaterally without discrete drainable fluid at this time.   Progressive anasarca with development of pleural effusions, mild ascites, and moderate diffuse subcutaneous body wall edema.   Aortic Atherosclerosis   Assessment & Plan:   Problem List Items Addressed This Visit   None Visit Diagnoses     Osteomyelitis, unspecified site, unspecified type ([place])    -  Primary     No orders of the defined types were placed in this encounter.    CC: Mrsa bacteremia Thoracolumbar epidural abscess/osteomyelitis (t5-s1) paraspinal myositis Psoas abscess (resolved on 9/12 mr thoracolumbar spine)   Abx: 7/14-20; 7/21-c vanc --> doxycycline stopped first week 01/2022  7/20-8/05 ceftaroline Previously daptomycin   ASSESSMENT: 56 yo male recent mrsa bacteremia/epidural abscess/OM and psoas abscess s/p open debridement 6/28 and IR drainage of left psoas discharged on daptomycin, now readmitted 7/13 for worsening back pain of a few days found to have recurrent mrsa bacteremia   7/13 & 7/15 & 7/17 & 7/19 & 7/20 bcx mrsa  7/22, 24, 26 bcx negative   7/13 mri lumbar/thoracic showed several areas of fluid collection with a more loculated focus at L4-5. There is no focal LE neurologic deficit or urinary/bowel incontinence. NSG reviewed case and felt no urgent decompression at this time   7/16 IR reviewed case and percutaneously drained the 5 cm L3 level subdural fluid collection.   7/17 tte no vegetation 7/19 tee no vegetation 7/20 abd/pelv ct with contrast no occult abscess   Wbc improving on vanc, but persistent bacteremia switched to combination abx therapy 7/20 dapto/ceftaroline --> vanc/ceftaroline on 7/21 (dapto resistant on sensitivity testing 7/13)   Repeat mri no drainable discrete abscess. Discussed with dr Dawley of nsg on 7/21.     As blood cx sterilized on combination therapy, switched back to monotherapy vancomycin on 8/05 with plan to do at least 6 weeks  08/23/2021 Patient has had more than 6 months of appropriate abx -- transitioned from iv to doxy 04/20/2021 Continues to have persistent moderate-severe pain Seeing pain clinic Paying out of pocket for mri, so we are operating rather blind on duration of doxy  -will plan to repeat mri in another 4-6 weeks -continue doxy for now -crp/cbc/cmp labs to be faxed to local office for him -televisit appointment in 8 weeks (2 weeks after mri)  12/30/2021 assessment His mobility is slowly improving He is still very debilitated in terms of back pain He takes oxycodone but doesn't see a pain clinic. I discussed multiple long term adverse effect of narcotics including depression immune system, disturbing endocrine system, disturbing GI autonomic nervous system, and advise him to seek pain manangement to get off narcotics  I also advise him to actually push through in terms of physical therapy beyond even if he has pain with activities  For now the alkphos is still high but improving. While immobility can do this we have to attribute this to infection. His crp is down normal for the first time as of 12/2021. His last mri doesn't have any frank abscess from 08/2021  I would finish 1 more month doxycycline which he have and see how he does off abx   Televisit 7-8 weeks  02/28/22 id assessment I am glad and caustiously  optimistic that he is doing well/infection resolved as no sign of sepsis since 3 weeks of antibiotics now   I will repeat crp moving forward periodically for the next few months to make sure he is really ok without sign of relapse   -crp/cbc/cmp he'll do at his pcp  and fax here -telephone visit again in around 6 weeks with me  -this was a phone visit  Follow-up: Return in about 6 weeks (around 04/11/2022).   I verified that I was speaking with the  correct person using two identifiers. Due to the COVID-19 Pandemic/nature of visit, this service was provided via telemedicine using audio/visual media.   The patient was located at home. The provider was located in the office. The patient did consent to this visit and is aware of charges through their insurance as well as the limitations of evaluation and management by telemedicine. Other persons participating in this telemedicine service were none. Time spent on visit was greater than 15 minutes on media and in coordination of care    Jabier Mutton, [place] for [place] [phone]  pager   [phone] cell 02/28/2022, 11:18 AM

## 2022-03-01 ENCOUNTER — Telehealth: Payer: Self-pay

## 2022-03-01 NOTE — Telephone Encounter (Signed)
Per Dr. Renold Don faxed lab orders to [facility]. Received confirmation that orders were faxed successfully. Requested patient call his PCP to have labs done and fax results before next appt. Patient verbalized understanding. Will requested labs be faxed to office. [phone]  Juanita Laster, RMA

## 2022-03-07 ENCOUNTER — Telehealth: Payer: Self-pay

## 2022-03-07 NOTE — Telephone Encounter (Signed)
Attempted to call patient to confirm he had labs done at [facility] as requested during last visit. Not able to reach him at this time. Voicemail is not setup. [facility] to follow up on orders.Spoke with Rn who states they received orders, but patient has not scheduled appointment for lab work. Will send mychart message requesting he do this. Juanita Laster, RMA

## 2022-03-07 NOTE — Telephone Encounter (Signed)
-----   Message from Raymondo Band, MD sent at 03/07/2022  9:45 AM EDT ----- Hi team   I have been treating this patient for mrsa spine infection for almost over a year and we had stopped antibiotics last visit   Could we make sure he had cbc, cmp, crp done with his pcp and results faxed to [place]. I would also like to do a phone/video visit with him in around another 3-6 weeks   Thanks all

## 2022-03-13 DIAGNOSIS — M4620 Osteomyelitis of vertebra, site unspecified: Secondary | ICD-10-CM | POA: Diagnosis not present

## 2022-03-27 DIAGNOSIS — E785 Hyperlipidemia, unspecified: Secondary | ICD-10-CM | POA: Diagnosis not present

## 2022-03-27 DIAGNOSIS — I10 Essential (primary) hypertension: Secondary | ICD-10-CM | POA: Diagnosis not present

## 2022-03-27 DIAGNOSIS — I5032 Chronic diastolic (congestive) heart failure: Secondary | ICD-10-CM | POA: Diagnosis not present

## 2022-03-27 DIAGNOSIS — G8929 Other chronic pain: Secondary | ICD-10-CM | POA: Diagnosis not present

## 2022-03-27 DIAGNOSIS — E1159 Type 2 diabetes mellitus with other circulatory complications: Secondary | ICD-10-CM | POA: Diagnosis not present

## 2022-03-27 DIAGNOSIS — F325 Major depressive disorder, single episode, in full remission: Secondary | ICD-10-CM | POA: Diagnosis not present

## 2022-03-27 DIAGNOSIS — E119 Type 2 diabetes mellitus without complications: Secondary | ICD-10-CM | POA: Diagnosis not present

## 2022-04-11 ENCOUNTER — Telehealth: Payer: Self-pay

## 2022-04-11 ENCOUNTER — Telehealth: Payer: Federal, State, Local not specified - PPO | Admitting: Internal Medicine

## 2022-04-11 ENCOUNTER — Other Ambulatory Visit: Payer: Self-pay

## 2022-04-11 NOTE — Telephone Encounter (Signed)
Called patient twice this morning to start virtual appointment, no answer either time and no voicemail box set up.   Beryle Flock, RN

## 2022-05-16 DIAGNOSIS — E1159 Type 2 diabetes mellitus with other circulatory complications: Secondary | ICD-10-CM | POA: Diagnosis not present

## 2022-05-16 DIAGNOSIS — G8929 Other chronic pain: Secondary | ICD-10-CM | POA: Diagnosis not present

## 2022-05-16 DIAGNOSIS — Z6828 Body mass index (BMI) 28.0-28.9, adult: Secondary | ICD-10-CM | POA: Diagnosis not present

## 2022-05-16 DIAGNOSIS — I5032 Chronic diastolic (congestive) heart failure: Secondary | ICD-10-CM | POA: Diagnosis not present

## 2022-06-20 ENCOUNTER — Telehealth: Payer: Self-pay

## 2022-06-20 NOTE — Telephone Encounter (Signed)
   Patient Name: Arrin Pintor  DOB: [birth year] MRN: [id number]  Primary Cardiologist: None  Patient is not established with [facility], follows with [facility]. Therefore, recommendations on surgical clearance should come from his primary cardiologist ([facility]).  I will route this recommendation to the requesting party, and will remove this request from the pre-op pool.    Flossie Dibble, NP 06/20/2022, 3:59 PM

## 2022-06-20 NOTE — Telephone Encounter (Signed)
   Pre-operative Risk Assessment    Patient Name: Louis [name]  DOB: [birth year] MRN: [id number]      Request for Surgical Clearance    Procedure:   Left Shoulder Arthroscopy  Date of Surgery:  Clearance TBD                                 Surgeon:  Jones Broom, MD Surgeon's Group or Practice Name:  [facility] Phone number:  [phone] Fax number:  [phone]   Type of Clearance Requested:   - Medical    Type of Anesthesia:  Not Indicated   Additional requests/questions:  Please advise surgeon/provider what medications should be held.  Signed, Irena Cords [place]   06/20/2022, 10:28 AM

## 2022-08-02 DIAGNOSIS — I2510 Atherosclerotic heart disease of native coronary artery without angina pectoris: Secondary | ICD-10-CM | POA: Diagnosis not present

## 2022-08-02 DIAGNOSIS — Z1330 Encounter for screening examination for mental health and behavioral disorders, unspecified: Secondary | ICD-10-CM | POA: Diagnosis not present

## 2022-11-17 DIAGNOSIS — R051 Acute cough: Secondary | ICD-10-CM | POA: Diagnosis not present

## 2022-11-17 DIAGNOSIS — R5383 Other fatigue: Secondary | ICD-10-CM | POA: Diagnosis not present

## 2022-11-17 DIAGNOSIS — D1430 Benign neoplasm of unspecified bronchus and lung: Secondary | ICD-10-CM | POA: Diagnosis not present

## 2022-11-17 DIAGNOSIS — R062 Wheezing: Secondary | ICD-10-CM | POA: Diagnosis not present

## 2022-11-21 ENCOUNTER — Other Ambulatory Visit: Payer: Self-pay

## 2022-11-21 ENCOUNTER — Emergency Department (HOSPITAL_COMMUNITY): Payer: Federal, State, Local not specified - PPO

## 2022-11-21 ENCOUNTER — Encounter (HOSPITAL_COMMUNITY): Payer: Self-pay

## 2022-11-21 ENCOUNTER — Inpatient Hospital Stay (HOSPITAL_COMMUNITY)
Admission: EM | Admit: 2022-11-21 | Discharge: 2022-12-01 | DRG: 177 | Disposition: A | Discharge status: Home / Self Care | Payer: Federal, State, Local not specified - PPO | Attending: Internal Medicine | Admitting: Internal Medicine

## 2022-11-21 DIAGNOSIS — Z7982 Long term (current) use of aspirin: Secondary | ICD-10-CM | POA: Diagnosis not present

## 2022-11-21 DIAGNOSIS — I2510 Atherosclerotic heart disease of native coronary artery without angina pectoris: Secondary | ICD-10-CM | POA: Diagnosis present

## 2022-11-21 DIAGNOSIS — J930 Spontaneous tension pneumothorax: Secondary | ICD-10-CM | POA: Diagnosis present

## 2022-11-21 DIAGNOSIS — Z22322 Carrier or suspected carrier of Methicillin resistant Staphylococcus aureus: Secondary | ICD-10-CM | POA: Diagnosis not present

## 2022-11-21 DIAGNOSIS — Z20822 Contact with and (suspected) exposure to covid-19: Secondary | ICD-10-CM | POA: Diagnosis present

## 2022-11-21 DIAGNOSIS — I110 Hypertensive heart disease with heart failure: Secondary | ICD-10-CM | POA: Diagnosis present

## 2022-11-21 DIAGNOSIS — F05 Delirium due to known physiological condition: Secondary | ICD-10-CM | POA: Diagnosis not present

## 2022-11-21 DIAGNOSIS — J9811 Atelectasis: Secondary | ICD-10-CM | POA: Diagnosis not present

## 2022-11-21 DIAGNOSIS — J939 Pneumothorax, unspecified: Secondary | ICD-10-CM | POA: Diagnosis present

## 2022-11-21 DIAGNOSIS — U071 COVID-19: Secondary | ICD-10-CM | POA: Diagnosis not present

## 2022-11-21 DIAGNOSIS — D649 Anemia, unspecified: Secondary | ICD-10-CM | POA: Diagnosis not present

## 2022-11-21 DIAGNOSIS — E222 Syndrome of inappropriate secretion of antidiuretic hormone: Secondary | ICD-10-CM | POA: Diagnosis not present

## 2022-11-21 DIAGNOSIS — I5032 Chronic diastolic (congestive) heart failure: Secondary | ICD-10-CM | POA: Diagnosis not present

## 2022-11-21 DIAGNOSIS — I959 Hypotension, unspecified: Secondary | ICD-10-CM | POA: Diagnosis present

## 2022-11-21 DIAGNOSIS — D75838 Other thrombocytosis: Secondary | ICD-10-CM | POA: Diagnosis not present

## 2022-11-21 DIAGNOSIS — J189 Pneumonia, unspecified organism: Secondary | ICD-10-CM | POA: Diagnosis present

## 2022-11-21 DIAGNOSIS — E1165 Type 2 diabetes mellitus with hyperglycemia: Secondary | ICD-10-CM | POA: Diagnosis present

## 2022-11-21 DIAGNOSIS — E785 Hyperlipidemia, unspecified: Secondary | ICD-10-CM | POA: Diagnosis present

## 2022-11-21 DIAGNOSIS — I252 Old myocardial infarction: Secondary | ICD-10-CM | POA: Diagnosis not present

## 2022-11-21 DIAGNOSIS — G928 Other toxic encephalopathy: Secondary | ICD-10-CM | POA: Diagnosis not present

## 2022-11-21 DIAGNOSIS — M869 Osteomyelitis, unspecified: Secondary | ICD-10-CM | POA: Diagnosis not present

## 2022-11-21 DIAGNOSIS — J9383 Other pneumothorax: Secondary | ICD-10-CM

## 2022-11-21 DIAGNOSIS — J9601 Acute respiratory failure with hypoxia: Secondary | ICD-10-CM | POA: Diagnosis not present

## 2022-11-21 DIAGNOSIS — I10 Essential (primary) hypertension: Secondary | ICD-10-CM | POA: Diagnosis present

## 2022-11-21 DIAGNOSIS — Z951 Presence of aortocoronary bypass graft: Secondary | ICD-10-CM

## 2022-11-21 DIAGNOSIS — R079 Chest pain, unspecified: Secondary | ICD-10-CM | POA: Diagnosis not present

## 2022-11-21 DIAGNOSIS — Z8616 Personal history of COVID-19: Secondary | ICD-10-CM | POA: Diagnosis not present

## 2022-11-21 DIAGNOSIS — J9312 Secondary spontaneous pneumothorax: Secondary | ICD-10-CM | POA: Diagnosis not present

## 2022-11-21 DIAGNOSIS — R0600 Dyspnea, unspecified: Secondary | ICD-10-CM | POA: Diagnosis not present

## 2022-11-21 DIAGNOSIS — J15212 Pneumonia due to Methicillin resistant Staphylococcus aureus: Principal | ICD-10-CM | POA: Diagnosis present

## 2022-11-21 DIAGNOSIS — Z7984 Long term (current) use of oral hypoglycemic drugs: Secondary | ICD-10-CM | POA: Diagnosis not present

## 2022-11-21 DIAGNOSIS — Z79899 Other long term (current) drug therapy: Secondary | ICD-10-CM

## 2022-11-21 DIAGNOSIS — Z888 Allergy status to other drugs, medicaments and biological substances status: Secondary | ICD-10-CM

## 2022-11-21 DIAGNOSIS — R0602 Shortness of breath: Secondary | ICD-10-CM | POA: Diagnosis not present

## 2022-11-21 DIAGNOSIS — R091 Pleurisy: Secondary | ICD-10-CM | POA: Diagnosis not present

## 2022-11-21 DIAGNOSIS — T368X5A Adverse effect of other systemic antibiotics, initial encounter: Secondary | ICD-10-CM | POA: Diagnosis not present

## 2022-11-21 DIAGNOSIS — F17210 Nicotine dependence, cigarettes, uncomplicated: Secondary | ICD-10-CM | POA: Diagnosis present

## 2022-11-21 DIAGNOSIS — J9311 Primary spontaneous pneumothorax: Secondary | ICD-10-CM | POA: Diagnosis not present

## 2022-11-21 LAB — BASIC METABOLIC PANEL
Anion gap: 19 — ABNORMAL HIGH (ref 5–15)
BUN: 19 mg/dL (ref 6–20)
CO2: 24 mmol/L (ref 22–32)
Calcium: 9.3 mg/dL (ref 8.9–10.3)
Chloride: 88 mmol/L — ABNORMAL LOW (ref 98–111)
Creatinine, Ser: 1.18 mg/dL (ref 0.61–1.24)
GFR, Estimated: >60 mL/min (ref >60)
Glucose, Bld: 289 mg/dL — ABNORMAL HIGH (ref 70–99)
Potassium: 4.7 mmol/L (ref 3.5–5.1)
Sodium: 131 mmol/L — ABNORMAL LOW (ref 135–145)

## 2022-11-21 LAB — PROTEIN, PLEURAL OR PERITONEAL FLUID: Total protein, fluid: 3.7 g/dL

## 2022-11-21 LAB — URINALYSIS, ROUTINE W REFLEX MICROSCOPIC
Bacteria, UA: NONE SEEN
Bilirubin Urine: NEGATIVE
Glucose, UA: 150 mg/dL — AB
Hgb urine dipstick: NEGATIVE
Ketones, ur: 5 mg/dL — AB
Leukocytes,Ua: NEGATIVE
Nitrite: NEGATIVE
Protein, ur: 30 mg/dL — AB
Specific Gravity, Urine: 1.028 (ref 1.005–1.030)
pH: 5 (ref 5.0–8.0)

## 2022-11-21 LAB — I-STAT CHEM 8, ED
BUN: 23 mg/dL — ABNORMAL HIGH (ref 6–20)
Calcium, Ion: 1.07 mmol/L — ABNORMAL LOW (ref 1.15–1.40)
Chloride: 94 mmol/L — ABNORMAL LOW (ref 98–111)
Creatinine, Ser: 1 mg/dL (ref 0.61–1.24)
Glucose, Bld: 293 mg/dL — ABNORMAL HIGH (ref 70–99)
HCT: 55 % — ABNORMAL HIGH (ref 39.0–52.0)
Hemoglobin: 18.7 g/dL — ABNORMAL HIGH (ref 13.0–17.0)
Potassium: 4.4 mmol/L (ref 3.5–5.1)
Sodium: 131 mmol/L — ABNORMAL LOW (ref 135–145)
TCO2: 27 mmol/L (ref 22–32)

## 2022-11-21 LAB — CBC WITH DIFFERENTIAL/PLATELET
Abs Immature Granulocytes: 0.15 10*3/uL — ABNORMAL HIGH (ref 0.00–0.07)
Basophils Absolute: 0 10*3/uL (ref 0.0–0.1)
Basophils Relative: 0 %
Eosinophils Absolute: 0 10*3/uL (ref 0.0–0.5)
Eosinophils Relative: 0 %
HCT: 50.6 % (ref 39.0–52.0)
Hemoglobin: 17.1 g/dL — ABNORMAL HIGH (ref 13.0–17.0)
Immature Granulocytes: 1 %
Lymphocytes Relative: 5 %
Lymphs Abs: 1.1 10*3/uL (ref 0.7–4.0)
MCH: 30.4 pg (ref 26.0–34.0)
MCHC: 33.8 g/dL (ref 30.0–36.0)
MCV: 89.9 fL (ref 80.0–100.0)
Monocytes Absolute: 1.8 10*3/uL — ABNORMAL HIGH (ref 0.1–1.0)
Monocytes Relative: 8 %
Neutro Abs: 19.5 10*3/uL — ABNORMAL HIGH (ref 1.7–7.7)
Neutrophils Relative %: 86 %
Platelets: 340 10*3/uL (ref 150–400)
RBC: 5.63 MIL/uL (ref 4.22–5.81)
RDW: 13.5 % (ref 11.5–15.5)
WBC: 22.6 10*3/uL — ABNORMAL HIGH (ref 4.0–10.5)
nRBC: 0 % (ref 0.0–0.2)

## 2022-11-21 LAB — GLUCOSE, PLEURAL OR PERITONEAL FLUID: Glucose, Fluid: 165 mg/dL

## 2022-11-21 LAB — RESP PANEL BY RT-PCR (RSV, FLU A&B, COVID)  RVPGX2
Influenza A by PCR: NEGATIVE
Influenza B by PCR: NEGATIVE
Resp Syncytial Virus by PCR: NEGATIVE
SARS Coronavirus 2 by RT PCR: POSITIVE — AB

## 2022-11-21 LAB — I-STAT VENOUS BLOOD GAS, ED
Acid-Base Excess: 1 mmol/L (ref 0.0–2.0)
Bicarbonate: 27.6 mmol/L (ref 20.0–28.0)
Calcium, Ion: 1.09 mmol/L — ABNORMAL LOW (ref 1.15–1.40)
HCT: 54 % — ABNORMAL HIGH (ref 39.0–52.0)
Hemoglobin: 18.4 g/dL — ABNORMAL HIGH (ref 13.0–17.0)
O2 Saturation: 56 %
Potassium: 4.4 mmol/L (ref 3.5–5.1)
Sodium: 130 mmol/L — ABNORMAL LOW (ref 135–145)
TCO2: 29 mmol/L (ref 22–32)
pCO2, Ven: 50.1 mmHg (ref 44–60)
pH, Ven: 7.349 (ref 7.25–7.43)
pO2, Ven: 31 mmHg — CL (ref 32–45)

## 2022-11-21 LAB — BODY FLUID CELL COUNT WITH DIFFERENTIAL
Lymphs, Fluid: 14 %
Neutrophil Count, Fluid: 86 % — ABNORMAL HIGH (ref 0–25)
Total Nucleated Cell Count, Fluid: UNDETERMINED cu mm (ref 0–1000)

## 2022-11-21 LAB — TROPONIN I (HIGH SENSITIVITY)
Troponin I (High Sensitivity): 42 ng/L — ABNORMAL HIGH (ref <18)
Troponin I (High Sensitivity): 32 ng/L — ABNORMAL HIGH (ref <18)

## 2022-11-21 LAB — CBG MONITORING, ED: Glucose-Capillary: 238 mg/dL — ABNORMAL HIGH (ref 70–99)

## 2022-11-21 LAB — LACTIC ACID, PLASMA: Lactic Acid, Venous: 1.8 mmol/L (ref 0.5–1.9)

## 2022-11-21 LAB — LACTATE DEHYDROGENASE, PLEURAL OR PERITONEAL FLUID: LD, Fluid: 2365 U/L — ABNORMAL HIGH (ref 3–23)

## 2022-11-21 LAB — BRAIN NATRIURETIC PEPTIDE: B Natriuretic Peptide: 204.6 pg/mL — ABNORMAL HIGH (ref 0.0–100.0)

## 2022-11-21 LAB — STREP PNEUMONIAE URINARY ANTIGEN: Strep Pneumo Urinary Antigen: NEGATIVE

## 2022-11-21 MED ORDER — VANCOMYCIN HCL 1750 MG/350ML IV SOLN
1750.0000 mg | Freq: Once | INTRAVENOUS | Status: AC
  Administered 2022-11-22: 1750 mg via INTRAVENOUS
  Filled 2022-11-21 (×2): qty 350

## 2022-11-21 MED ORDER — HYDROMORPHONE HCL 1 MG/ML IJ SOLN
1.0000 mg | Freq: Once | INTRAMUSCULAR | Status: AC
  Administered 2022-11-21: 1 mg via INTRAVENOUS
  Filled 2022-11-21: qty 1

## 2022-11-21 MED ORDER — ONDANSETRON HCL 4 MG/2ML IJ SOLN: 4.0000 mg | Freq: Once | INTRAMUSCULAR | Status: DC

## 2022-11-21 MED ORDER — KETAMINE HCL 50 MG/5ML IJ SOSY
0.5000 mg/kg | PREFILLED_SYRINGE | Freq: Once | INTRAMUSCULAR | Status: AC
  Filled 2022-11-21: qty 5

## 2022-11-21 MED ORDER — GUAIFENESIN 100 MG/5ML PO LIQD
5.0000 mL | ORAL | Status: DC | PRN
  Administered 2022-11-21 – 2022-11-26 (×10): 5 mL via ORAL
  Filled 2022-11-21 (×10): qty 10

## 2022-11-21 MED ORDER — MELATONIN 5 MG PO TABS
10.0000 mg | ORAL_TABLET | Freq: Every day | ORAL | Status: DC
  Administered 2022-11-21 – 2022-11-30 (×10): 10 mg via ORAL
  Filled 2022-11-21 (×10): qty 2

## 2022-11-21 MED ORDER — GABAPENTIN 300 MG PO CAPS
300.0000 mg | ORAL_CAPSULE | Freq: Two times a day (BID) | ORAL | Status: DC
  Administered 2022-11-21 – 2022-11-22 (×3): 300 mg via ORAL
  Filled 2022-11-21 (×3): qty 1

## 2022-11-21 MED ORDER — ENOXAPARIN SODIUM 40 MG/0.4ML IJ SOSY
40.0000 mg | PREFILLED_SYRINGE | INTRAMUSCULAR | Status: DC
  Administered 2022-11-22 – 2022-11-30 (×9): 40 mg via SUBCUTANEOUS
  Filled 2022-11-21 (×10): qty 0.4

## 2022-11-21 MED ORDER — INSULIN ASPART 100 UNIT/ML IJ SOLN
0.0000 [IU] | Freq: Three times a day (TID) | INTRAMUSCULAR | Status: DC
  Administered 2022-11-22: 8 [IU] via SUBCUTANEOUS
  Administered 2022-11-22: 5 [IU] via SUBCUTANEOUS
  Administered 2022-11-22: 8 [IU] via SUBCUTANEOUS
  Administered 2022-11-23 (×2): 5 [IU] via SUBCUTANEOUS
  Administered 2022-11-23: 3 [IU] via SUBCUTANEOUS
  Administered 2022-11-24 (×3): 5 [IU] via SUBCUTANEOUS
  Administered 2022-11-25 (×2): 8 [IU] via SUBCUTANEOUS
  Administered 2022-11-25: 3 [IU] via SUBCUTANEOUS
  Administered 2022-11-26 (×2): 5 [IU] via SUBCUTANEOUS
  Administered 2022-11-26 – 2022-11-27 (×2): 3 [IU] via SUBCUTANEOUS
  Administered 2022-11-27: 2 [IU] via SUBCUTANEOUS
  Administered 2022-11-28 – 2022-11-29 (×4): 3 [IU] via SUBCUTANEOUS
  Administered 2022-11-29: 8 [IU] via SUBCUTANEOUS
  Administered 2022-11-29: 5 [IU] via SUBCUTANEOUS
  Administered 2022-11-30 – 2022-12-01 (×3): 2 [IU] via SUBCUTANEOUS

## 2022-11-21 MED ORDER — LIDOCAINE-EPINEPHRINE (PF) 2 %-1:200000 IJ SOLN: 20.0000 mL | Freq: Once | INTRAMUSCULAR | Status: DC

## 2022-11-21 MED ORDER — IPRATROPIUM-ALBUTEROL 0.5-2.5 (3) MG/3ML IN SOLN
3.0000 mL | Freq: Once | RESPIRATORY_TRACT | Status: AC
  Administered 2022-11-21: 3 mL via RESPIRATORY_TRACT
  Filled 2022-11-21: qty 3

## 2022-11-21 MED ORDER — PROPOFOL 10 MG/ML IV BOLUS
0.5000 mg/kg | Freq: Once | INTRAVENOUS | Status: AC
  Administered 2022-11-21: 42.9 mg via INTRAVENOUS
  Filled 2022-11-21: qty 20

## 2022-11-21 MED ORDER — KETAMINE HCL 50 MG/5ML IJ SOSY
PREFILLED_SYRINGE | INTRAMUSCULAR | Status: AC
  Administered 2022-11-21: 43 mg via INTRAVENOUS
  Filled 2022-11-21: qty 5

## 2022-11-21 MED ORDER — ATORVASTATIN CALCIUM 40 MG PO TABS
40.0000 mg | ORAL_TABLET | Freq: Every day | ORAL | Status: DC
  Administered 2022-11-22 – 2022-12-01 (×10): 40 mg via ORAL
  Filled 2022-11-21 (×10): qty 1

## 2022-11-21 MED ORDER — SODIUM CHLORIDE 0.9 % IV SOLN
500.0000 mg | INTRAVENOUS | Status: DC
  Administered 2022-11-21 – 2022-11-22 (×2): 500 mg via INTRAVENOUS
  Filled 2022-11-21 (×3): qty 5

## 2022-11-21 MED ORDER — INSULIN ASPART 100 UNIT/ML IJ SOLN
0.0000 [IU] | Freq: Every day | INTRAMUSCULAR | Status: DC
  Administered 2022-11-21: 2 [IU] via SUBCUTANEOUS
  Administered 2022-11-23: 3 [IU] via SUBCUTANEOUS
  Administered 2022-11-24 – 2022-11-25 (×2): 2 [IU] via SUBCUTANEOUS
  Administered 2022-11-27: 3 [IU] via SUBCUTANEOUS
  Administered 2022-11-28: 4 [IU] via SUBCUTANEOUS
  Administered 2022-11-30: 2 [IU] via SUBCUTANEOUS

## 2022-11-21 MED ORDER — SODIUM CHLORIDE 0.9 % IV SOLN
2.0000 g | INTRAVENOUS | Status: DC
  Administered 2022-11-21 – 2022-11-22 (×2): 2 g via INTRAVENOUS
  Filled 2022-11-21 (×2): qty 20

## 2022-11-21 MED ORDER — SODIUM CHLORIDE 0.9% FLUSH
10.0000 mL | Freq: Three times a day (TID) | INTRAVENOUS | Status: DC
  Administered 2022-11-22 – 2022-11-24 (×7): 10 mL via INTRAPLEURAL

## 2022-11-21 MED ORDER — FOLIC ACID 1 MG PO TABS
1.0000 mg | ORAL_TABLET | Freq: Every day | ORAL | Status: DC
  Administered 2022-11-22 – 2022-12-01 (×10): 1 mg via ORAL
  Filled 2022-11-21 (×10): qty 1

## 2022-11-21 MED ORDER — VANCOMYCIN HCL IN DEXTROSE 1-5 GM/200ML-% IV SOLN
1000.0000 mg | Freq: Two times a day (BID) | INTRAVENOUS | Status: DC
  Administered 2022-11-22 – 2022-11-24 (×6): 1000 mg via INTRAVENOUS
  Filled 2022-11-21 (×8): qty 200

## 2022-11-21 MED ORDER — BENZONATATE 100 MG PO CAPS
200.0000 mg | ORAL_CAPSULE | Freq: Three times a day (TID) | ORAL | Status: DC | PRN
  Administered 2022-11-22 – 2022-11-23 (×2): 200 mg via ORAL
  Filled 2022-11-21 (×2): qty 2

## 2022-11-21 MED ORDER — ASPIRIN 81 MG PO TBEC
81.0000 mg | DELAYED_RELEASE_TABLET | Freq: Every day | ORAL | Status: DC
  Administered 2022-11-22 – 2022-12-01 (×10): 81 mg via ORAL
  Filled 2022-11-21 (×10): qty 1

## 2022-11-21 MED ORDER — GUAIFENESIN 100 MG/5ML PO SYRP: 100.0000 mg | ORAL_SOLUTION | Freq: Three times a day (TID) | ORAL | Status: DC | PRN

## 2022-11-21 MED ORDER — SODIUM CHLORIDE 0.9 % IV BOLUS
500.0000 mL | Freq: Once | INTRAVENOUS | Status: AC
  Administered 2022-11-21: 500 mL via INTRAVENOUS

## 2022-11-21 MED ORDER — HYDROMORPHONE HCL 1 MG/ML IJ SOLN
1.0000 mg | INTRAMUSCULAR | Status: DC | PRN
  Administered 2022-11-21 – 2022-11-24 (×11): 1 mg via INTRAVENOUS
  Filled 2022-11-21 (×11): qty 1

## 2022-11-21 MED ORDER — PANTOPRAZOLE SODIUM 40 MG PO TBEC
80.0000 mg | DELAYED_RELEASE_TABLET | Freq: Every day | ORAL | Status: DC
  Administered 2022-11-22 – 2022-12-01 (×10): 80 mg via ORAL
  Filled 2022-11-21 (×10): qty 2

## 2022-11-21 MED ORDER — LAMOTRIGINE 25 MG PO TABS
25.0000 mg | ORAL_TABLET | Freq: Two times a day (BID) | ORAL | Status: DC
  Administered 2022-11-21 – 2022-12-01 (×20): 25 mg via ORAL
  Filled 2022-11-21 (×21): qty 1

## 2022-11-21 NOTE — ED Provider Notes (Signed)
[place] EMERGENCY DEPARTMENT AT [facility] Provider Note  CSN: [id number] Arrival date & time: 11/21/22 1442  Chief Complaint(s) Shortness of Breath  HPI Louis [name] is a 57 y.o. male with past medical history as below, significant for CAD DM HLD HTN who presents to the ED with complaint of dib/uri s/s.  Patient reports feeling unwell for the past 3 to 4 days, positive sick contacts with spouse and child.  Productive cough with brown/green sputum worse in the past 24 hours, chest pain, worse with coughing.  Difficulty breathing worse with any type of exertion.  Does not wear home oxygen or CPAP/BiPAP.  On arrival patient hypoxic to low 80s with conversational dyspnea, accessory muscle use.  Some improvement with nasal cannula 6 L.  He is amenable to BiPAP if needed.  Patient reports that he no longer smokes cigarettes, no history of lung disease that he is aware of.  No significant medication or diet changes.  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/2021   Chronic diastolic congestive heart failure (HCC)    Uncontrolled type 2 diabetes mellitus with hyperglycemia (HCC)    Acute midline low back pain without sciatica    Pressure injury of skin 01/30/2021   Debility 01/29/2021   Protein-calorie malnutrition, severe 01/13/2021   Discitis 01/13/2021   Abnormal EKG 01/13/2021   Thrombocytosis 01/13/2021   Hypomagnesemia 01/13/2021   Hypokalemia 01/13/2021   Oral thrush 01/13/2021   GERD (gastroesophageal reflux disease) 01/13/2021   Cholestasis 01/13/2021    Normocytic anemia 01/06/2021   Hyponatremia 01/06/2021   Anxiety 01/06/2021   Chronic diastolic CHF (congestive heart failure) (HCC) 01/06/2021   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 Authorizing Provider  acetaminophen (TYLENOL) 325 MG tablet Take 1-2 tablets (325-650 mg total) by mouth every 4 (four) hours as needed for mild pain. 02/17/21  Yes Love, Evlyn Kanner, PA-C  amLODipine (NORVASC) 10 MG tablet Take 1 tablet (10 mg total) by mouth daily. 02/19/21  Yes Love, Evlyn Kanner, PA-C  aspirin EC 81 MG tablet Take 81 mg by mouth daily. Swallow whole.   Yes [provider]  atorvastatin (LIPITOR) 40 MG tablet Take 40 mg by mouth daily. 08/16/22  Yes [provider]  folic acid (FOLVITE) 1 MG tablet Take 1 tablet (1 mg total) by mouth daily. 02/19/21  Yes Love, Evlyn Kanner, PA-C  furosemide (LASIX) 20 MG tablet Take 1 tablet (20 mg total) by mouth daily. 02/21/21  Yes Lovorn, Aundra Millet, MD  gabapentin (NEURONTIN) 300 MG capsule Take 1 capsule (300 mg total) by mouth 3 (three) times daily. Patient taking differently: Take 300 mg by mouth 2 (two) times daily. 02/20/21  Yes Lovorn, Aundra Millet, MD  glipiZIDE (GLUCOTROL XL) 5 MG 24 hr tablet Take 5 mg by mouth daily. 08/28/22  Yes [provider]  guaifenesin (ROBITUSSIN) 100 MG/5ML syrup Take 100 mg by mouth 3 (three) times daily as needed  for cough. SO unsure of dose.   Yes [provider]  lamoTRIgine (LAMICTAL) 25 MG tablet Take 25 mg by mouth 2 (two) times daily. 08/25/22  Yes [provider]  losartan (COZAAR) 25 MG tablet Take 0.5 tablets (12.5 mg total) by mouth every morning. 02/18/21  Yes Love, Evlyn Kanner, PA-C  melatonin 5 MG TABS Take 10 mg by mouth at bedtime.   Yes [provider]  metoprolol tartrate (LOPRESSOR) 25 MG tablet Take 25 mg by mouth daily. 09/21/22  Yes [provider]  omeprazole (PRILOSEC) 40 MG capsule Take 40 mg by mouth daily. 09/14/22  Yes [provider]                                                                                                                                    Past Surgical History Past Surgical History:  Procedure Laterality Date   CORONARY ARTERY BYPASS GRAFT     LUMBAR LAMINECTOMY FOR EPIDURAL ABSCESS N/A 12/21/2020   Procedure: Thoracic seven-eight, Thoracic twelve-Lumbar one, Lumbar three-four laminectomy for Epidural Abscess;  Surgeon: Dawley, Alan Mulder, DO;  Location: [facility] OR;  Service: Neurosurgery;  Laterality: N/A;   TEE WITHOUT CARDIOVERSION N/A 01/11/2021   Procedure: TRANSESOPHAGEAL ECHOCARDIOGRAM (TEE);  Surgeon: Lewayne Bunting, MD;  Location: [facility] ENDOSCOPY;  Service: Cardiovascular;  Laterality: N/A;   Family History History reviewed. No pertinent family history.  Social History Social History   Tobacco Use   Smoking status: Every Day    Packs/day: 2    Types: Cigarettes    Start date: 06/28/1995   Smokeless tobacco: Never  Vaping Use   Vaping Use: Never used  Substance Use Topics   Alcohol use: Not Currently   Drug use: Never   Allergies Patient has no known allergies.  Review of Systems Review of Systems  Constitutional:  Positive for appetite change, chills and fatigue. Negative for fever.  HENT:  Negative for facial swelling and trouble swallowing.   Eyes:  Negative for photophobia and visual disturbance.   Respiratory:  Positive for cough, chest tightness and shortness of breath.   Cardiovascular:  Positive for chest pain. Negative for palpitations.  Gastrointestinal:  Negative for abdominal pain, nausea and vomiting.  Endocrine: Negative for polydipsia and polyuria.  Genitourinary:  Negative for difficulty urinating and hematuria.  Musculoskeletal:  Negative for gait problem and joint swelling.  Skin:  Negative for pallor and rash.  Neurological:  Negative for syncope and headaches.  Psychiatric/Behavioral:  Negative  for agitation and confusion.     Physical Exam Vital Signs  I have reviewed the triage vital signs BP 121/78   Pulse 82   Temp 98.8 F (37.1 C)   Resp (!) 25   Ht 5\' 11"  (1.803 m)   Wt 85.7 kg   SpO2 97%   BMI 26.36 kg/m  Physical Exam Vitals and nursing note reviewed.  Constitutional:      General: He is in acute distress.     Appearance: He is ill-appearing.  HENT:     Head: Normocephalic and atraumatic.     Right Ear: External ear normal.     Left Ear: External ear normal.     Mouth/Throat:     Mouth: Mucous membranes are moist.  Eyes:     General: No scleral icterus. Cardiovascular:     Rate and Rhythm: Normal rate and regular rhythm.     Pulses: Normal pulses.     Heart sounds: Normal heart sounds.  Pulmonary:     Effort: Tachypnea and respiratory distress present.     Breath sounds: Decreased air movement present. Decreased breath sounds and wheezing present.     Comments: Coarse breath sounds Wheezing throughout Diminished right Abdominal:     General: Abdomen is flat.     Palpations: Abdomen is soft.     Tenderness: There is no abdominal tenderness. There is no guarding.  Musculoskeletal:        General: Normal range of motion.     Right lower leg: No edema.     Left lower leg: No edema.  Skin:    General: Skin is warm and dry.     Capillary Refill: Capillary refill takes less than 2 seconds.  Neurological:     Mental Status: He is  alert and oriented to person, place, and time.  Psychiatric:        Mood and Affect: Mood normal.        Behavior: Behavior normal.     ED Results and Treatments Labs (all labs ordered are listed, but only abnormal results are displayed) Labs Reviewed  RESP PANEL BY RT-PCR (RSV, FLU A&B, COVID)  RVPGX2 - Abnormal; Notable for the following components:      Result Value   SARS Coronavirus 2 by RT PCR POSITIVE (*)    All other components within normal limits  BASIC METABOLIC PANEL - Abnormal; Notable for the following components:   Sodium 131 (*)    Chloride 88 (*)    Glucose, Bld 289 (*)    Anion gap 19 (*)    All other components within normal limits  CBC WITH DIFFERENTIAL/PLATELET - Abnormal; Notable for the following components:   WBC 22.6 (*)    Hemoglobin 17.1 (*)    Neutro Abs 19.5 (*)    Monocytes Absolute 1.8 (*)    Abs Immature Granulocytes 0.15 (*)    All other components within normal limits  BRAIN NATRIURETIC PEPTIDE - Abnormal; Notable for the following components:   B Natriuretic Peptide 204.6 (*)    All other components within normal limits  URINALYSIS, ROUTINE W REFLEX MICROSCOPIC - Abnormal; Notable for the following components:   Glucose, UA 150 (*)    Ketones, ur 5 (*)    Protein, ur 30 (*)    All other components within normal limits  I-STAT VENOUS BLOOD GAS, ED - Abnormal; Notable for the following components:   pO2, Ven 31 (*)    Sodium 130 (*)  Calcium, Ion 1.09 (*)    HCT 54.0 (*)    Hemoglobin 18.4 (*)    All other components within normal limits  I-STAT CHEM 8, ED - Abnormal; Notable for the following components:   Sodium 131 (*)    Chloride 94 (*)    BUN 23 (*)    Glucose, Bld 293 (*)    Calcium, Ion 1.07 (*)    Hemoglobin 18.7 (*)    HCT 55.0 (*)    All other components within normal limits  TROPONIN I (HIGH SENSITIVITY) - Abnormal; Notable for the following components:   Troponin I (High Sensitivity) 42 (*)    All other components  within normal limits  TROPONIN I (HIGH SENSITIVITY) - Abnormal; Notable for the following components:   Troponin I (High Sensitivity) 32 (*)    All other components within normal limits  CULTURE, BLOOD (ROUTINE X 2)  CULTURE, BLOOD (ROUTINE X 2)  MRSA NEXT GEN BY PCR, NASAL  BODY FLUID CULTURE W GRAM STAIN  LACTIC ACID, PLASMA  LACTIC ACID, PLASMA  PROTIME-INR  APTT  HIV ANTIBODY (ROUTINE TESTING W REFLEX)  CBC  BASIC METABOLIC PANEL  STREP PNEUMONIAE URINARY ANTIGEN  LEGIONELLA PNEUMOPHILA SEROGP 1 UR AG  HEMOGLOBIN A1C  GLUCOSE, PLEURAL OR PERITONEAL FLUID  LACTATE DEHYDROGENASE, PLEURAL OR PERITONEAL FLUID  BODY FLUID CELL COUNT WITH DIFFERENTIAL  PROTEIN, PLEURAL OR PERITONEAL FLUID  LACTATE DEHYDROGENASE  PROTEIN, TOTAL  CYTOLOGY - NON PAP                                                                                                                          Radiology DG Chest Portable 1 View  Result Date: 11/21/2022 CLINICAL DATA:  Pneumothorax, status post pleural drainage catheter placement EXAM: PORTABLE CHEST 1 VIEW COMPARISON:  11/21/2022 at 3:11 p.m. FINDINGS: A right pigtail pleural drainage catheter is in place. Questionable residual less than 5% pneumothorax along the lung apex, markedly improved from previous. Airspace opacity observed at the right lung base along the diaphragm, and in the right perihilar region. Prior CABG.  Low lung volumes. IMPRESSION: 1. Markedly improved right pneumothorax status post pigtail pleural drainage catheter placement. There is a questionable residual less than 5% pneumothorax along the right lung apex. 2. Airspace opacity at the right lung base and in the right perihilar region. Followup PA and lateral chest X-ray is recommended in 3-4 weeks to ensure resolution and exclude underlying malignancy. 3. Prior CABG. Electronically Signed   By: Gaylyn Rong M.D.   On: 11/21/2022 18:09   DG Chest Port 1 View  Result Date:  11/21/2022 CLINICAL DATA:  Dyspnea EXAM: PORTABLE CHEST 1 VIEW COMPARISON:  12/29/2020 FINDINGS: There is a large right pneumothorax with central collapse of the right lung. No significant hyperexpansion of the right hemithorax or mediastinal shift to suggest tension physiology. No pneumothorax or pleural effusion on the left. Coronary artery bypass grafting has been performed. Cardiac size within normal limits. No acute  bone abnormality. IMPRESSION: 1. Large right pneumothorax with central collapse of the right lung. Electronically Signed   By: Helyn Numbers M.D.   On: 11/21/2022 15:31    Pertinent labs & imaging results that were available during my care of the patient were reviewed by me and considered in my medical decision making (see MDM for details).  Medications Ordered in ED Medications  cefTRIAXone (ROCEPHIN) 2 g in sodium chloride 0.9 % 100 mL IVPB (0 g Intravenous Stopped 11/21/22 1624)  azithromycin (ZITHROMAX) 500 mg in sodium chloride 0.9 % 250 mL IVPB (0 mg Intravenous Stopped 11/21/22 1652)  lidocaine-EPINEPHrine (XYLOCAINE W/EPI) 2 %-1:200000 (PF) injection 20 mL (20 mLs Other Not Given 11/21/22 1657)  ondansetron (ZOFRAN) injection 4 mg (0 mg Intravenous Hold 11/21/22 1718)  aspirin EC tablet 81 mg (has no administration in time range)  atorvastatin (LIPITOR) tablet 40 mg (has no administration in time range)  folic acid (FOLVITE) tablet 1 mg (has no administration in time range)  gabapentin (NEURONTIN) capsule 300 mg (300 mg Oral Given 11/21/22 2125)  lamoTRIgine (LAMICTAL) tablet 25 mg (25 mg Oral Given 11/21/22 2125)  melatonin tablet 10 mg (10 mg Oral Given 11/21/22 2125)  pantoprazole (PROTONIX) EC tablet 80 mg (has no administration in time range)  guaiFENesin (ROBITUSSIN) 100 MG/5ML liquid 5 mL (has no administration in time range)  enoxaparin (LOVENOX) injection 40 mg (has no administration in time range)  vancomycin (VANCOREADY) IVPB 1750 mg/350 mL (has no administration in  time range)  vancomycin (VANCOCIN) IVPB 1000 mg/200 mL premix (has no administration in time range)  sodium chloride flush (NS) 0.9 % injection 10 mL (10 mLs Intrapleural Not Given 11/21/22 2133)  insulin aspart (novoLOG) injection 0-15 Units (has no administration in time range)  insulin aspart (novoLOG) injection 0-5 Units (has no administration in time range)  benzonatate (TESSALON) capsule 200 mg (has no administration in time range)  HYDROmorphone (DILAUDID) injection 1 mg (1 mg Intravenous Given 11/21/22 2146)  ipratropium-albuterol (DUONEB) 0.5-2.5 (3) MG/3ML nebulizer solution 3 mL (3 mLs Nebulization Given 11/21/22 1536)  sodium chloride 0.9 % bolus 500 mL (0 mLs Intravenous Stopped 11/21/22 1720)  ketamine 50 mg in normal saline 5 mL (10 mg/mL) syringe (43 mg Intravenous Given by Other 11/21/22 1642)  propofol (DIPRIVAN) 10 mg/mL bolus/IV push 42.9 mg (42.9 mg Intravenous Given 11/21/22 1642)  HYDROmorphone (DILAUDID) injection 1 mg (1 mg Intravenous Given 11/21/22 1717)                                                                                                                                     Procedures .Critical Care  Performed by: Sloan Leiter, DO Authorized by: Sloan Leiter, DO   Critical care provider statement:    Critical care time (minutes):  85   Critical care time was exclusive of:  Separately billable procedures and treating other patients   Critical care was  necessary to treat or prevent imminent or life-threatening deterioration of the following conditions:  Respiratory failure and sepsis   Critical care was time spent personally by me on the following activities:  Development of treatment plan with patient or surrogate, discussions with consultants, evaluation of patient's response to treatment, examination of patient, ordering and review of laboratory studies, ordering and review of radiographic studies, ordering and performing treatments and interventions, pulse  oximetry, re-evaluation of patient's condition, review of old charts and obtaining history from patient or surrogate   Care discussed with: admitting provider   .Sedation  Date/Time: 11/21/2022 7:14 PM  Performed by: Sloan Leiter, DO Authorized by: Sloan Leiter, DO   Consent:    Consent obtained:  Verbal and written   Consent given by:  Patient   Risks discussed:  Allergic reaction, prolonged hypoxia resulting in organ damage, dysrhythmia and prolonged sedation necessitating reversal   Alternatives discussed:  Analgesia without sedation and anxiolysis Universal protocol:    Procedure explained and questions answered to patient or proxy's satisfaction: yes     Immediately prior to procedure, a time out was called: yes     Patient identity confirmed:  Arm band and verbally with patient Indications:    Procedure performed:  Chest tube placement Pre-sedation assessment:    Time since last food or drink:  48 hours   ASA classification: class 2 - patient with mild systemic disease     Mallampati score:  I - soft palate, uvula, fauces, pillars visible   Neck mobility: normal     Pre-sedation assessments completed and reviewed: airway patency, cardiovascular function, hydration status, mental status, nausea/vomiting, pain level, respiratory function and temperature     Pre-sedation assessment completed:  11/21/2022 4:10 PM Immediate pre-procedure details:    Reassessment: Patient reassessed immediately prior to procedure     Reviewed: vital signs, relevant labs/tests and NPO status     Verified: bag valve mask available, emergency equipment available, intubation equipment available, IV patency confirmed, oxygen available, reversal medications available and suction available   Procedure details (see MAR for exact dosages):    Preoxygenation:  Nasal cannula   Sedation:  Ketamine and propofol   Intended level of sedation: moderate (conscious sedation)   Intra-procedure monitoring:  Blood  pressure monitoring, cardiac monitor, continuous pulse oximetry, continuous capnometry, frequent vital sign checks and frequent LOC assessments   Intra-procedure events: none     Total Provider sedation time (minutes):  50 Post-procedure details:    Post-sedation assessment completed:  11/21/2022 5:15 PM   Attendance: Constant attendance by certified staff until patient recovered     Recovery: Patient returned to pre-procedure baseline     Post-sedation assessments completed and reviewed: airway patency, cardiovascular function, hydration status, mental status, nausea/vomiting, pain level, respiratory function and temperature     Patient is stable for discharge or admission: yes     Procedure completion:  Tolerated well, no immediate complications CHEST TUBE INSERTION  Date/Time: 11/21/2022 9:41 PM  Performed by: Sloan Leiter, DO Authorized by: Sloan Leiter, DO   Consent:    Consent obtained:  Verbal and written   Consent given by:  Patient   Risks, benefits, and alternatives were discussed: yes     Risks discussed:  Bleeding, incomplete drainage, infection and pain   Alternatives discussed:  No treatment and delayed treatment Universal protocol:    Procedure explained and questions answered to patient or proxy's satisfaction: yes     Immediately prior to procedure,  a time out was called: yes     Patient identity confirmed:  Verbally with patient and arm band Pre-procedure details:    Skin preparation:  Chlorhexidine with alcohol   Preparation: Patient was prepped and draped in the usual sterile fashion   Sedation:    Sedation type:  Moderate sedation Anesthesia:    Anesthesia method:  Local infiltration   Local anesthetic:  Lidocaine 2% w/o epi Procedure details:    Placement location:  R lateral   Scalpel size:  11   Tube size (French): pigtail.   Tension pneumothorax: no     Tube connected to:  Suction   Drainage characteristics:  Serosanguinous   Suture material:  0  silk   Dressing:  Xeroform gauze and 4x4 sterile gauze Post-procedure details:    Post-insertion x-ray findings: tube in good position     Procedure completion:  Tolerated well, no immediate complications   (including critical care time)  Medical Decision Making / ED Course    Medical Decision Making:    Louis [name] is a 57 y.o. male with past medical history as below, significant for CAD DM HLD HTN who presents to the ED with complaint of dib/uri s/s. Marland Kitchen The complaint involves an extensive differential diagnosis and also carries with it a high risk of complications and morbidity.  Serious etiology was considered. Ddx includes but is not limited to: In my evaluation of this patient's dyspnea my DDx includes, but is not limited to, pneumonia, pulmonary embolism, pneumothorax, pulmonary edema, metabolic acidosis, asthma, COPD, cardiac cause, anemia, anxiety, etc.    Complete initial physical exam performed, notably the patient  was conversational dyspnea, hypoxia on room air, improved with 6 L nasal cannula.    Reviewed and confirmed nursing documentation for past medical history, family history, social history.  Vital signs reviewed.    Clinical Course as of 11/21/22 2147  Tue Nov 21, 2022  1522 WBC(!): 22.6 Concern pna [SG]  1525 Tachypnea noted w/ hypoxia. Activate code sepsis given leukocytosis 22.6 [SG]  1535 Pneumothorax of right lung on portable chest xray [CP]  1736 SARS Coronavirus 2 by RT PCR(!): POSITIVE [SG]  1736 On wet read lung appears to be inflated with pigtail in place [SG]  1759 Troponin I (High Sensitivity)(!): 42 Similar to prior, favor demand ischemia in setting of ptx [SG]  1928 D/w CTCS Dr Cliffton Asters, recommend consult pulm for chest tube mgmt, will d/w pccm [SG]  2010 D/w PCCM Dr Everardo All, she will see in consult  [SG]  2045 Awaiting callback from trh [SG]    Clinical Course User Index [CP] Prosperi, Christian H, PA-C [SG] Tanda Rockers A, DO    Pt found to  have PTX, spontaneous, no recent trauma.  Patient has been coughing over the past few days, possible provoking factor.  Was found of COVID-19, query pneumonia. Consented for chest tube placement, this completed facilitated by procedural sedation, see note Chest tube in appropriate position, pigtail.  Reexpansion of the lung noted, Resp status greatly improved Recommend admission for spontaneous pneumothorax s/p pigtail, elev trop, ?pna with sepsis 2/2 pna. D/w Dr Julian Reil who accepts for admission         Additional history obtained: -Additional history obtained from na -External records from outside source obtained and reviewed including: Chart review including previous notes, labs, imaging, consultation notes including primary care recommendation, prior labs and imaging Home medications   Lab Tests: -I ordered, reviewed, and interpreted labs.   The pertinent results include:  Labs Reviewed  RESP PANEL BY RT-PCR (RSV, FLU A&B, COVID)  RVPGX2 - Abnormal; Notable for the following components:      Result Value   SARS Coronavirus 2 by RT PCR POSITIVE (*)    All other components within normal limits  BASIC METABOLIC PANEL - Abnormal; Notable for the following components:   Sodium 131 (*)    Chloride 88 (*)    Glucose, Bld 289 (*)    Anion gap 19 (*)    All other components within normal limits  CBC WITH DIFFERENTIAL/PLATELET - Abnormal; Notable for the following components:   WBC 22.6 (*)    Hemoglobin 17.1 (*)    Neutro Abs 19.5 (*)    Monocytes Absolute 1.8 (*)    Abs Immature Granulocytes 0.15 (*)    All other components within normal limits  BRAIN NATRIURETIC PEPTIDE - Abnormal; Notable for the following components:   B Natriuretic Peptide 204.6 (*)    All other components within normal limits  URINALYSIS, ROUTINE W REFLEX MICROSCOPIC - Abnormal; Notable for the following components:   Glucose, UA 150 (*)    Ketones, ur 5 (*)    Protein, ur 30 (*)    All other  components within normal limits  I-STAT VENOUS BLOOD GAS, ED - Abnormal; Notable for the following components:   pO2, Ven 31 (*)    Sodium 130 (*)    Calcium, Ion 1.09 (*)    HCT 54.0 (*)    Hemoglobin 18.4 (*)    All other components within normal limits  I-STAT CHEM 8, ED - Abnormal; Notable for the following components:   Sodium 131 (*)    Chloride 94 (*)    BUN 23 (*)    Glucose, Bld 293 (*)    Calcium, Ion 1.07 (*)    Hemoglobin 18.7 (*)    HCT 55.0 (*)    All other components within normal limits  TROPONIN I (HIGH SENSITIVITY) - Abnormal; Notable for the following components:   Troponin I (High Sensitivity) 42 (*)    All other components within normal limits  TROPONIN I (HIGH SENSITIVITY) - Abnormal; Notable for the following components:   Troponin I (High Sensitivity) 32 (*)    All other components within normal limits  CULTURE, BLOOD (ROUTINE X 2)  CULTURE, BLOOD (ROUTINE X 2)  MRSA NEXT GEN BY PCR, NASAL  BODY FLUID CULTURE W GRAM STAIN  LACTIC ACID, PLASMA  LACTIC ACID, PLASMA  PROTIME-INR  APTT  HIV ANTIBODY (ROUTINE TESTING W REFLEX)  CBC  BASIC METABOLIC PANEL  STREP PNEUMONIAE URINARY ANTIGEN  LEGIONELLA PNEUMOPHILA SEROGP 1 UR AG  HEMOGLOBIN A1C  GLUCOSE, PLEURAL OR PERITONEAL FLUID  LACTATE DEHYDROGENASE, PLEURAL OR PERITONEAL FLUID  BODY FLUID CELL COUNT WITH DIFFERENTIAL  PROTEIN, PLEURAL OR PERITONEAL FLUID  LACTATE DEHYDROGENASE  PROTEIN, TOTAL  CYTOLOGY - NON PAP    Notable for troponin elevated, BNP elevated, leukocytosis  EKG   EKG Interpretation  Date/Time:    Ventricular Rate:    PR Interval:    QRS Duration:   QT Interval:    QTC Calculation:   R Axis:     Text Interpretation:           Imaging Studies ordered: I ordered imaging studies including chest x-ray I independently visualized the following imaging with scope of interpretation limited to determining acute life threatening conditions related to emergency care; findings  noted above, significant for thorax, query pneumonia I independently visualized and interpreted imaging.  I agree with the radiologist interpretation   Medicines ordered and prescription drug management: Meds ordered this encounter  Medications   ipratropium-albuterol (DUONEB) 0.5-2.5 (3) MG/3ML nebulizer solution 3 mL   sodium chloride 0.9 % bolus 500 mL   cefTRIAXone (ROCEPHIN) 2 g in sodium chloride 0.9 % 100 mL IVPB    Order Specific Question:   Antibiotic Indication:    Answer:   CAP   azithromycin (ZITHROMAX) 500 mg in sodium chloride 0.9 % 250 mL IVPB    Order Specific Question:   Antibiotic Indication:    Answer:   CAP   ketamine 50 mg in normal saline 5 mL (10 mg/mL) syringe   propofol (DIPRIVAN) 10 mg/mL bolus/IV push 42.9 mg   lidocaine-EPINEPHrine (XYLOCAINE W/EPI) 2 %-1:200000 (PF) injection 20 mL   ketamine HCl 50 MG/5ML SOSY    Wise, Nason S: cabinet override   ondansetron (ZOFRAN) injection 4 mg   HYDROmorphone (DILAUDID) injection 1 mg   aspirin EC tablet 81 mg    Swallow whole.     atorvastatin (LIPITOR) tablet 40 mg   folic acid (FOLVITE) tablet 1 mg   gabapentin (NEURONTIN) capsule 300 mg   lamoTRIgine (LAMICTAL) tablet 25 mg   melatonin tablet 10 mg   pantoprazole (PROTONIX) EC tablet 80 mg   DISCONTD: guaifenesin (ROBITUSSIN) 100 MG/5ML syrup 100 mg    SO unsure of dose.     guaiFENesin (ROBITUSSIN) 100 MG/5ML liquid 5 mL   enoxaparin (LOVENOX) injection 40 mg   vancomycin (VANCOREADY) IVPB 1750 mg/350 mL    Order Specific Question:   Indication:    Answer:   Pneumonia   vancomycin (VANCOCIN) IVPB 1000 mg/200 mL premix    Order Specific Question:   Indication:    Answer:   Pneumonia   sodium chloride flush (NS) 0.9 % injection 10 mL   insulin aspart (novoLOG) injection 0-15 Units    Order Specific Question:   Correction coverage:    Answer:   Moderate (average weight, post-op)    Order Specific Question:   CBG < 70:    Answer:   Implement  Hypoglycemia Standing Orders and refer to Hypoglycemia Standing Orders sidebar report    Order Specific Question:   CBG 70 - 120:    Answer:   0 units    Order Specific Question:   CBG 121 - 150:    Answer:   2 units    Order Specific Question:   CBG 151 - 200:    Answer:   3 units    Order Specific Question:   CBG 201 - 250:    Answer:   5 units    Order Specific Question:   CBG 251 - 300:    Answer:   8 units    Order Specific Question:   CBG 301 - 350:    Answer:   11 units    Order Specific Question:   CBG 351 - 400:    Answer:   15 units    Order Specific Question:   CBG > 400    Answer:   call MD and obtain STAT lab verification   insulin aspart (novoLOG) injection 0-5 Units    Order Specific Question:   Correction coverage:    Answer:   HS scale    Order Specific Question:   CBG < 70:    Answer:   Implement Hypoglycemia Standing Orders and refer to Hypoglycemia Standing Orders sidebar report    Order Specific Question:  CBG 70 - 120:    Answer:   0 units    Order Specific Question:   CBG 121 - 150:    Answer:   0 units    Order Specific Question:   CBG 151 - 200:    Answer:   0 units    Order Specific Question:   CBG 201 - 250:    Answer:   2 units    Order Specific Question:   CBG 251 - 300:    Answer:   3 units    Order Specific Question:   CBG 301 - 350:    Answer:   4 units    Order Specific Question:   CBG 351 - 400:    Answer:   5 units    Order Specific Question:   CBG > 400    Answer:   call MD and obtain STAT lab verification   benzonatate (TESSALON) capsule 200 mg   HYDROmorphone (DILAUDID) injection 1 mg    -I have reviewed the patients home medicines and have made adjustments as needed   Consultations Obtained: I requested consultation with the [facility], PCCM,  and discussed lab and imaging findings as well as pertinent plan - they recommend: Pulmonary to manage chest tube   Cardiac Monitoring: The patient was maintained on a cardiac monitor.  I  personally viewed and interpreted the cardiac monitored which showed an underlying rhythm of: NSR  Social Determinants of Health:  Diagnosis or treatment significantly limited by social determinants of health: current smoker   Reevaluation: After the interventions noted above, I reevaluated the patient and found that they have improved  Co morbidities that complicate the patient evaluation  Past Medical History:  Diagnosis Date   CAD (coronary artery disease)    DM (diabetes mellitus) (HCC)    Hyperlipidemia    Hypertension       Dispostion: Disposition decision including need for hospitalization was considered, and patient admitted to the hospital.    Final Clinical Impression(s) / ED Diagnoses Final diagnoses:  Acute respiratory failure with hypoxia (HCC)  Spontaneous pneumothorax  COVID-19     This chart was dictated using voice recognition software.  Despite best efforts to proofread,  errors can occur which can change the documentation meaning.    Sloan Leiter, DO 11/21/22 2147

## 2022-11-21 NOTE — H&P (Signed)
History and Physical    Patient: Louis [name] ZOX:[id number] DOB: March 11, 1966 DOA: 11/21/2022 DOS: the patient was seen and examined on 11/21/2022 PCP: Bobette Mo, NP  Patient coming from: Home  Chief Complaint:  Chief Complaint  Patient presents with   Shortness of Breath   HPI: Louis [name] is a 57 y.o. 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  Pt reports recent COVID-19 infection ~2 weeks ago.  He seemed to get better from that (rest of family ill as well).  Then this past week, got sick again, worse over past 3-4 days with SOB, CP, cough, fever.  Started on prednisone on Friday has almost finished a course of that without benefit.  Today in to ED with resp distress, hypotension, and hypoxia.  Found to have large right PTX.  Pt greatly improved after just 500cc IVF bolus and chest tube which has re-expanded lung.  Now satting 98% on 3L and HYPERtensive.  Repeat CXR does show R sided PNA.   Review of Systems: As mentioned in the history of present illness. All other systems reviewed and are negative. Past Medical History:  Diagnosis Date   CAD (coronary artery disease)    DM (diabetes mellitus) (HCC)    Hyperlipidemia    Hypertension    Past Surgical History:  Procedure Laterality Date   CORONARY ARTERY BYPASS GRAFT     LUMBAR LAMINECTOMY FOR EPIDURAL ABSCESS N/A 12/21/2020   Procedure: Thoracic seven-eight, Thoracic twelve-Lumbar one, Lumbar three-four laminectomy for Epidural Abscess;  Surgeon: Dawley, Alan Mulder, DO;  Location: [facility] OR;  Service: Neurosurgery;  Laterality: N/A;   TEE WITHOUT CARDIOVERSION N/A 01/11/2021   Procedure: TRANSESOPHAGEAL ECHOCARDIOGRAM (TEE);  Surgeon: Lewayne Bunting, MD;  Location: [facility] ENDOSCOPY;  Service: Cardiovascular;  Laterality: N/A;   Social History:  reports that he has been smoking cigarettes. He started smoking about 27 years ago. He has been smoking an average of 2  packs per day. He has never used smokeless tobacco. He reports that he does not currently use alcohol. He reports that he does not use drugs.  No Known Allergies  History reviewed. No pertinent family history.  Prior to Admission medications   Medication Sig Start Date End Date Taking? Authorizing Provider  acetaminophen (TYLENOL) 325 MG tablet Take 1-2 tablets (325-650 mg total) by mouth every 4 (four) hours as needed for mild pain. 02/17/21  Yes Love, Evlyn Kanner, PA-C  amLODipine (NORVASC) 10 MG tablet Take 1 tablet (10 mg total) by mouth daily. 02/19/21  Yes Love, Evlyn Kanner, PA-C  aspirin EC 81 MG tablet Take 81 mg by mouth daily. Swallow whole.   Yes [provider]  atorvastatin (LIPITOR) 40 MG tablet Take 40 mg by mouth daily. 08/16/22  Yes [provider]  folic acid (FOLVITE) 1 MG tablet Take 1 tablet (1 mg total) by mouth daily. 02/19/21  Yes Love, Evlyn Kanner, PA-C  furosemide (LASIX) 20 MG tablet Take 1 tablet (20 mg total) by mouth daily. 02/21/21  Yes Lovorn, Aundra Millet, MD  gabapentin (NEURONTIN) 300 MG capsule Take 1 capsule (300 mg total) by mouth 3 (three) times daily. Patient taking differently: Take 300 mg by mouth 2 (two) times daily. 02/20/21  Yes Lovorn, Aundra Millet, MD  glipiZIDE (GLUCOTROL XL) 5 MG 24 hr tablet Take 5 mg by mouth daily. 08/28/22  Yes [provider]  guaifenesin (ROBITUSSIN) 100 MG/5ML syrup Take 100 mg by mouth 3 (three) times daily as  needed for cough. SO unsure of dose.   Yes [provider]  lamoTRIgine (LAMICTAL) 25 MG tablet Take 25 mg by mouth 2 (two) times daily. 08/25/22  Yes [provider]  losartan (COZAAR) 25 MG tablet Take 0.5 tablets (12.5 mg total) by mouth every morning. 02/18/21  Yes Love, Evlyn Kanner, PA-C  melatonin 5 MG TABS Take 10 mg by mouth at bedtime.   Yes [provider]  metoprolol tartrate (LOPRESSOR) 25 MG tablet Take 25 mg by mouth daily. 09/21/22  Yes [provider]  omeprazole (PRILOSEC)  40 MG capsule Take 40 mg by mouth daily. 09/14/22  Yes [provider]    Physical Exam: Vitals:   11/21/22 1740 11/21/22 1750 11/21/22 1800 11/21/22 2030  BP: 127/78 112/73 109/72 121/78  Pulse: 94 96 95 82  Resp: 16 (!) 31 17 (!) 25  Temp:    98.8 F (37.1 C)  TempSrc:      SpO2: 96% 92% 94% 97%  Weight:      Height:       Constitutional: NAD, ill appearing, in pain following coughing spell Respiratory:  Coarse BS throughout R side Cardiovascular: Regular rate and rhythm, no murmurs / rubs / gallops. No extremity edema. 2+ pedal pulses. No carotid bruits.  Abdomen: no tenderness, no masses palpated. No hepatosplenomegaly. Bowel sounds positive.  Neurologic: CN 2-12 grossly intact. Sensation intact, DTR normal. Strength 5/5 in all 4.  Psychiatric: Normal judgment and insight. Alert and oriented x 3. Normal mood.   Data Reviewed:    Labs on Admission: I have personally reviewed following labs and imaging studies  CBC: Recent Labs  Lab 11/21/22 1500 11/21/22 1516  WBC 22.6*  --   NEUTROABS 19.5*  --   HGB 17.1* 18.4*  18.7*  HCT 50.6 54.0*  55.0*  MCV 89.9  --   PLT 340  --    Basic Metabolic Panel: Recent Labs  Lab 11/21/22 1500 11/21/22 1516  NA 131* 130*  131*  K 4.7 4.4  4.4  CL 88* 94*  CO2 24  --   GLUCOSE 289* 293*  BUN 19 23*  CREATININE 1.18 1.00  CALCIUM 9.3  --    GFR: Estimated Creatinine Clearance: 84.7 mL/min (by C-G formula based on SCr of 1 mg/dL).  Urine analysis:    Component Value Date/Time   COLORURINE YELLOW 11/21/2022 2130   APPEARANCEUR CLEAR 11/21/2022 2130   LABSPEC 1.028 11/21/2022 2130   PHURINE 5.0 11/21/2022 2130   GLUCOSEU 150 (A) 11/21/2022 2130   HGBUR NEGATIVE 11/21/2022 2130   BILIRUBINUR NEGATIVE 11/21/2022 2130   KETONESUR 5 (A) 11/21/2022 2130   PROTEINUR 30 (A) 11/21/2022 2130   NITRITE NEGATIVE 11/21/2022 2130   LEUKOCYTESUR NEGATIVE 11/21/2022 2130    Radiological Exams on Admission: DG Chest  Portable 1 View  Result Date: 11/21/2022 CLINICAL DATA:  Pneumothorax, status post pleural drainage catheter placement EXAM: PORTABLE CHEST 1 VIEW COMPARISON:  11/21/2022 at 3:11 p.m. FINDINGS: A right pigtail pleural drainage catheter is in place. Questionable residual less than 5% pneumothorax along the lung apex, markedly improved from previous. Airspace opacity observed at the right lung base along the diaphragm, and in the right perihilar region. Prior CABG.  Low lung volumes. IMPRESSION: 1. Markedly improved right pneumothorax status post pigtail pleural drainage catheter placement. There is a questionable residual less than 5% pneumothorax along the right lung apex. 2. Airspace opacity at the right lung base and in the right perihilar region. Followup  PA and lateral chest X-ray is recommended in 3-4 weeks to ensure resolution and exclude underlying malignancy. 3. Prior CABG. Electronically Signed   By: Gaylyn Rong M.D.   On: 11/21/2022 18:09   DG Chest Port 1 View  Result Date: 11/21/2022 CLINICAL DATA:  Dyspnea EXAM: PORTABLE CHEST 1 VIEW COMPARISON:  12/29/2020 FINDINGS: There is a large right pneumothorax with central collapse of the right lung. No significant hyperexpansion of the right hemithorax or mediastinal shift to suggest tension physiology. No pneumothorax or pleural effusion on the left. Coronary artery bypass grafting has been performed. Cardiac size within normal limits. No acute bone abnormality. IMPRESSION: 1. Large right pneumothorax with central collapse of the right lung. Electronically Signed   By: Helyn Numbers M.D.   On: 11/21/2022 15:31     Assessment and Plan: * CAP (community acquired pneumonia) Given elevated WBC: concerned about secondary bacterial PNA following recent COVID-19 infection Met initial SIRS criteria with hypotension as well, however this is improved after just 500cc bolus and CT insertion to improve PTx. Suspect initial tachycardia and hypotension  may have been due to PTx as opposed to sepsis given the rapid turn around with minimal IVF Continue to monitor closely Rocephin / azithro started in ED Will add Vanc given h/o MRSA bacteremia / osteomyelitis Urine for s.pneumo and legionella MRSA PCR nares Tele monitor  Pneumothorax Improved s/p CT. PCCM managing CT Dilaudid PRN pain  Lab test positive for detection of COVID-19 virus Pt testing positive for COVID-19, but pt just had COVID-19 and tested positive x2 weeks ago. Suspect todays illness represents bacterial PNA and PTx more than COVID-19 therefore. With ~1 week of symptoms: out of window for Antivirals to do much good anyhow And has just about completed 5 days of steroids anyhow (started on Friday).  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 Repeat BMP in AM for now to trend.  Uncontrolled type 2 diabetes mellitus with hyperglycemia (HCC) SSI mod scale AC/HS for now Hold home glipizide.  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  Will cover PNA today with vanc for the moment.  HTN (hypertension) Hold home BP meds for the moment due to initial soft BPs in ED      Advance Care Planning:   Code Status: Full Code  Consults: PCCM  Family Communication: No family in room  Severity of Illness: The appropriate patient status for this patient is INPATIENT. Inpatient status is judged to be reasonable and necessary in order to provide the required intensity of service to ensure the patient's safety. The patient's presenting symptoms, physical exam findings, and initial radiographic and laboratory data in the context of their chronic comorbidities is felt to place them at high risk for further clinical deterioration. Furthermore, it is not anticipated that the patient will be medically stable for discharge from the hospital within 2 midnights of  admission.   * I certify that at the point of admission it is my clinical judgment that the patient will require inpatient hospital care spanning beyond 2 midnights from the point of admission due to high intensity of service, high risk for further deterioration and high frequency of surveillance required.*  Author: Hillary Bow., DO 11/21/2022 9:42 PM  For on call review [email or web address].

## 2022-11-21 NOTE — Assessment & Plan Note (Addendum)
Pt testing positive for COVID-19, but pt just had COVID-19 and tested positive x2 weeks ago. Suspect todays illness represents bacterial PNA and PTx more than COVID-19 therefore. With ~1 week of symptoms: out of window for Antivirals to do much good anyhow And has just about completed 5 days of steroids anyhow (started on Friday).

## 2022-11-21 NOTE — ED Notes (Signed)
ED TO INPATIENT HANDOFF REPORT  ED Nurse Name and Phone #: Amil Amen [phone]  S Name/Age/Gender Louis [name] 57 y.o. male Room/Bed: 022C/022C  Code Status   Code Status: Full Code  Home/SNF/Other Home Patient oriented to: self, place, time, and situation Is this baseline? Yes   Triage Complete: Triage complete  Chief Complaint CAP (community acquired pneumonia) [J18.9]  Triage Note Pt states he has had a URI x 1 week; pt given steroids Friday, no improvement, worsening over the weekend; endorses fevers, CP with cough   Allergies No Known Allergies  Level of Care/Admitting Diagnosis ED Disposition     ED Disposition  Admit   Condition  --   Comment  Hospital Area: [facility] [[id number]]  Level of Care: Progressive [102]  Admit to Progressive based on following criteria: MULTISYSTEM THREATS such as stable sepsis, metabolic/electrolyte imbalance with or without encephalopathy that is responding to early treatment.  Admit to Progressive based on following criteria: CARDIOVASCULAR & THORACIC of moderate stability with acute coronary syndrome symptoms/low risk myocardial infarction/hypertensive urgency/arrhythmias/heart failure potentially compromising stability and stable post cardiovascular intervention patients.  May admit patient to [facility] or [facility] if equivalent level of care is available:: No  Covid Evaluation: Confirmed COVID Positive  Diagnosis: CAP (community acquired pneumonia) [454098]  Admitting Physician: Hillary Bow [phone]  Attending Physician: Hillary Bow [phone]  Certification:: I certify this patient is being admitted for an inpatient-only procedure  Estimated Length of Stay: 4          B Medical/Surgery History Past Medical History:  Diagnosis Date   CAD (coronary artery disease)    DM (diabetes mellitus) (HCC)    Hyperlipidemia    Hypertension    Past Surgical History:  Procedure Laterality Date   CORONARY ARTERY  BYPASS GRAFT     LUMBAR LAMINECTOMY FOR EPIDURAL ABSCESS N/A 12/21/2020   Procedure: Thoracic seven-eight, Thoracic twelve-Lumbar one, Lumbar three-four laminectomy for Epidural Abscess;  Surgeon: Bethann Goo, DO;  Location: [facility] OR;  Service: Neurosurgery;  Laterality: N/A;   TEE WITHOUT CARDIOVERSION N/A 01/11/2021   Procedure: TRANSESOPHAGEAL ECHOCARDIOGRAM (TEE);  Surgeon: Lewayne Bunting, MD;  Location: [facility];  Service: Cardiovascular;  Laterality: N/A;     A IV Location/Drains/Wounds Patient Lines/Drains/Airways Status     Active Line/Drains/Airways     Name Placement date Placement time Site Days   Peripheral IV 11/21/22 20 G Anterior;Right Forearm 11/21/22  1530  Forearm  less than 1   Peripheral IV 11/21/22 20 G Anterior;Left;Proximal Forearm 11/21/22  1548  Forearm  less than 1   PICC Single Lumen 02/04/21 Left Brachial 46 cm 1 cm 02/04/21  1830  Brachial  655            Intake/Output Last 24 hours  Intake/Output Summary (Last 24 hours) at 11/21/2022 2137 Last data filed at 11/21/2022 1720 Gross per 24 hour  Intake 847.97 ml  Output --  Net 847.97 ml    Labs/Imaging Results for orders placed or performed during the hospital encounter of 11/21/22 (from the past 48 hour(s))  Basic metabolic panel     Status: Abnormal   Collection Time: 11/21/22  3:00 PM  Result Value Ref Range   Sodium 131 (L) 135 - 145 mmol/L   Potassium 4.7 3.5 - 5.1 mmol/L    Comment: HEMOLYSIS AT THIS LEVEL MAY AFFECT RESULT   Chloride 88 (L) 98 - 111 mmol/L   CO2 24 22 - 32 mmol/L  Glucose, Bld 289 (H) 70 - 99 mg/dL    Comment: Glucose reference range applies only to samples taken after fasting for at least 8 hours.   BUN 19 6 - 20 mg/dL   Creatinine, Ser 6.04 0.61 - 1.24 mg/dL   Calcium 9.3 8.9 - 54.0 mg/dL   GFR, Estimated >98 >11 mL/min    Comment: (NOTE) Calculated using the CKD-EPI Creatinine Equation (2021)    Anion gap 19 (H) 5 - 15    Comment: Performed at [facility], [address] [place], [place] [address]  CBC with Differential     Status: Abnormal   Collection Time: 11/21/22  3:00 PM  Result Value Ref Range   WBC 22.6 (H) 4.0 - 10.5 K/uL   RBC 5.63 4.22 - 5.81 MIL/uL   Hemoglobin 17.1 (H) 13.0 - 17.0 g/dL   HCT 29.5 62.1 - 30.8 %   MCV 89.9 80.0 - 100.0 fL   MCH 30.4 26.0 - 34.0 pg   MCHC 33.8 30.0 - 36.0 g/dL   RDW 65.7 84.6 - 96.2 %   Platelets 340 150 - 400 K/uL   nRBC 0.0 0.0 - 0.2 %   Neutrophils Relative % 86 %   Neutro Abs 19.5 (H) 1.7 - 7.7 K/uL   Lymphocytes Relative 5 %   Lymphs Abs 1.1 0.7 - 4.0 K/uL   Monocytes Relative 8 %   Monocytes Absolute 1.8 (H) 0.1 - 1.0 K/uL   Eosinophils Relative 0 %   Eosinophils Absolute 0.0 0.0 - 0.5 K/uL   Basophils Relative 0 %   Basophils Absolute 0.0 0.0 - 0.1 K/uL   Immature Granulocytes 1 %   Abs Immature Granulocytes 0.15 (H) 0.00 - 0.07 K/uL    Comment: Performed at [facility], [address] [place], [place] [address]  Brain natriuretic peptide     Status: Abnormal   Collection Time: 11/21/22  3:00 PM  Result Value Ref Range   B Natriuretic Peptide 204.6 (H) 0.0 - 100.0 pg/mL    Comment: Performed at [facility], [address], [place], [place] [address]  Troponin I (High Sensitivity)     Status: Abnormal   Collection Time: 11/21/22  3:00 PM  Result Value Ref Range   Troponin I (High Sensitivity) 42 (H) <18 ng/L    Comment: (NOTE) Elevated high sensitivity troponin I (hsTnI) values and significant  changes across serial measurements may suggest ACS but many other  chronic and acute conditions are known to elevate hsTnI results.  Refer to the "Links" section for chest pain algorithms and additional  guidance. Performed at [facility], [address] [place], [place] [address]   I-Stat venous blood gas, [facility] ED, [facility], [facility])     Status: Abnormal   Collection Time: 11/21/22  3:16 PM  Result Value Ref Range   pH, Ven 7.349 7.25 - 7.43   pCO2, Ven  50.1 44 - 60 mmHg   pO2, Ven 31 (LL) 32 - 45 mmHg   Bicarbonate 27.6 20.0 - 28.0 mmol/L   TCO2 29 22 - 32 mmol/L   O2 Saturation 56 %   Acid-Base Excess 1.0 0.0 - 2.0 mmol/L   Sodium 130 (L) 135 - 145 mmol/L   Potassium 4.4 3.5 - 5.1 mmol/L   Calcium, Ion 1.09 (L) 1.15 - 1.40 mmol/L   HCT 54.0 (H) 39.0 - 52.0 %   Hemoglobin 18.4 (H) 13.0 - 17.0 g/dL   Sample  type VENOUS    Comment NOTIFIED PHYSICIAN   I-stat chem 8, ED (not at [facility], [facility] or [facility])     Status: Abnormal   Collection Time: 11/21/22  3:16 PM  Result Value Ref Range   Sodium 131 (L) 135 - 145 mmol/L   Potassium 4.4 3.5 - 5.1 mmol/L   Chloride 94 (L) 98 - 111 mmol/L   BUN 23 (H) 6 - 20 mg/dL   Creatinine, Ser 1.61 0.61 - 1.24 mg/dL   Glucose, Bld 096 (H) 70 - 99 mg/dL    Comment: Glucose reference range applies only to samples taken after fasting for at least 8 hours.   Calcium, Ion 1.07 (L) 1.15 - 1.40 mmol/L   TCO2 27 22 - 32 mmol/L   Hemoglobin 18.7 (H) 13.0 - 17.0 g/dL   HCT 04.5 (H) 40.9 - 81.1 %  Resp panel by RT-PCR (RSV, Flu A&B, Covid) Anterior Nasal Swab     Status: Abnormal   Collection Time: 11/21/22  3:51 PM   Specimen: Anterior Nasal Swab  Result Value Ref Range   SARS Coronavirus 2 by RT PCR POSITIVE (A) NEGATIVE   Influenza A by PCR NEGATIVE NEGATIVE   Influenza B by PCR NEGATIVE NEGATIVE    Comment: (NOTE) The Xpert Xpress SARS-CoV-2/FLU/RSV plus assay is intended as an aid in the diagnosis of influenza from Nasopharyngeal swab specimens and should not be used as a sole basis for treatment. Nasal washings and aspirates are unacceptable for Xpert Xpress SARS-CoV-2/FLU/RSV testing.  Fact Sheet for Patients: BloggerCourse.com  Fact Sheet for Healthcare Providers: SeriousBroker.it  This test is not yet approved or cleared by the [place] FDA and has been authorized for detection and/or diagnosis of SARS-CoV-2 by FDA under an Emergency Use  Authorization (EUA). This EUA will remain in effect (meaning this test can be used) for the duration of the COVID-19 declaration under Section 564(b)(1) of the Act, 21 U.S.C. section 360bbb-3(b)(1), unless the authorization is terminated or revoked.     Resp Syncytial Virus by PCR NEGATIVE NEGATIVE    Comment: (NOTE) Fact Sheet for Patients: BloggerCourse.com  Fact Sheet for Healthcare Providers: SeriousBroker.it  This test is not yet approved or cleared by the [place] FDA and has been authorized for detection and/or diagnosis of SARS-CoV-2 by FDA under an Emergency Use Authorization (EUA). This EUA will remain in effect (meaning this test can be used) for the duration of the COVID-19 declaration under Section 564(b)(1) of the Act, 21 U.S.C. section 360bbb-3(b)(1), unless the authorization is terminated or revoked.  Performed at [facility], [address], [place], [place] [address]   Lactic acid, plasma     Status: None   Collection Time: 11/21/22  7:15 PM  Result Value Ref Range   Lactic Acid, Venous 1.8 0.5 - 1.9 mmol/L    Comment: Performed at [facility], [address], [place], [place] [address]  Troponin I (High Sensitivity)     Status: Abnormal   Collection Time: 11/21/22  7:15 PM  Result Value Ref Range   Troponin I (High Sensitivity) 32 (H) <18 ng/L    Comment: (NOTE) Elevated high sensitivity troponin I (hsTnI) values and significant  changes across serial measurements may suggest ACS but many other  chronic and acute conditions are known to elevate hsTnI results.  Refer to the "Links" section for chest pain algorithms and additional  guidance. Performed at [facility], [address] [place], [place] [address]  DG Chest Portable 1 View  Result Date: 11/21/2022 CLINICAL DATA:  Pneumothorax, status post pleural drainage catheter placement EXAM: PORTABLE CHEST 1 VIEW COMPARISON:   11/21/2022 at 3:11 p.m. FINDINGS: A right pigtail pleural drainage catheter is in place. Questionable residual less than 5% pneumothorax along the lung apex, markedly improved from previous. Airspace opacity observed at the right lung base along the diaphragm, and in the right perihilar region. Prior CABG.  Low lung volumes. IMPRESSION: 1. Markedly improved right pneumothorax status post pigtail pleural drainage catheter placement. There is a questionable residual less than 5% pneumothorax along the right lung apex. 2. Airspace opacity at the right lung base and in the right perihilar region. Followup PA and lateral chest X-ray is recommended in 3-4 weeks to ensure resolution and exclude underlying malignancy. 3. Prior CABG. Electronically Signed   By: Gaylyn Rong M.D.   On: 11/21/2022 18:09   DG Chest Port 1 View  Result Date: 11/21/2022 CLINICAL DATA:  Dyspnea EXAM: PORTABLE CHEST 1 VIEW COMPARISON:  12/29/2020 FINDINGS: There is a large right pneumothorax with central collapse of the right lung. No significant hyperexpansion of the right hemithorax or mediastinal shift to suggest tension physiology. No pneumothorax or pleural effusion on the left. Coronary artery bypass grafting has been performed. Cardiac size within normal limits. No acute bone abnormality. IMPRESSION: 1. Large right pneumothorax with central collapse of the right lung. Electronically Signed   By: Helyn Numbers M.D.   On: 11/21/2022 15:31    Pending Labs Unresulted Labs (From admission, onward)     Start     Ordered   11/22/22 0500  CBC  Tomorrow morning,   R        11/21/22 2101   11/22/22 0500  Basic metabolic panel  Tomorrow morning,   R        11/21/22 2101   11/22/22 0500  Lactate dehydrogenase  (Thoracentesis Labs Panel)  Tomorrow morning,   R        11/21/22 2132   11/22/22 0500  Protein, total  (Thoracentesis Labs Panel)  Tomorrow morning,   R        11/21/22 2132   11/21/22 2132  Glucose, pleural or peritoneal  fluid  (Thoracentesis Labs Panel)  Once,   R        11/21/22 2132   11/21/22 2132  Lactate dehydrogenase (pleural or peritoneal fluid)  (Thoracentesis Labs Panel)  Once,   R        11/21/22 2132   11/21/22 2132  Body fluid cell count with differential  (Thoracentesis Labs Panel)  Once,   R       Question:  Are there also cytology or pathology orders on this specimen?  Answer:  Yes   11/21/22 2132   11/21/22 2132  Protein, pleural or peritoneal fluid  (Thoracentesis Labs Panel)  Once,   R        11/21/22 2132   11/21/22 2132  Body fluid culture w Gram Stain  (Thoracentesis Labs Panel)  Once,   R       Question:  Are there also cytology or pathology orders on this specimen?  Answer:  Yes   11/21/22 2132   11/21/22 2122  Hemoglobin A1c  Once,   R       Comments: To assess prior glycemic control    11/21/22 2121   11/21/22 2109  MRSA Next Gen by PCR, Nasal  (MRSA Screening)  Once,   R  11/21/22 2108   11/21/22 2101  Strep pneumoniae urinary antigen  Once,   R        11/21/22 2101   11/21/22 2101  Legionella Pneumophila Serogp 1 Ur Ag  Once,   R        11/21/22 2101   11/21/22 2058  HIV Antibody (routine testing w rflx)  (HIV Antibody (Routine testing w reflex) panel)  Once,   R        11/21/22 2101   11/21/22 1524  Lactic acid, plasma  (Septic presentation on arrival (screening labs, nursing and treatment orders for obvious sepsis))  Now then every 2 hours,   R (with STAT occurrences)      11/21/22 1525   11/21/22 1524  Protime-INR  (Septic presentation on arrival (screening labs, nursing and treatment orders for obvious sepsis))  ONCE - STAT,   STAT        11/21/22 1525   11/21/22 1524  APTT  (Septic presentation on arrival (screening labs, nursing and treatment orders for obvious sepsis))  ONCE - STAT,   STAT        11/21/22 1525   11/21/22 1524  Blood Culture (routine x 2)  (Septic presentation on arrival (screening labs, nursing and treatment orders for obvious sepsis))  BLOOD  CULTURE X 2,   STAT      11/21/22 1525   11/21/22 1524  Urinalysis, Routine w reflex microscopic -Urine, Clean Catch  (Septic presentation on arrival (screening labs, nursing and treatment orders for obvious sepsis))  ONCE - URGENT,   URGENT       Question:  Specimen Source  Answer:  Urine, Clean Catch   11/21/22 1525            Vitals/Pain Today's Vitals   11/21/22 1750 11/21/22 1800 11/21/22 1800 11/21/22 2030  BP: 112/73 109/72  121/78  Pulse: 96 95  82  Resp: (!) 31 17  (!) 25  Temp:    98.8 F (37.1 C)  TempSrc:      SpO2: 92% 94%  97%  Weight:      Height:      PainSc:   2      Isolation Precautions No active isolations  Medications Medications  cefTRIAXone (ROCEPHIN) 2 g in sodium chloride 0.9 % 100 mL IVPB (0 g Intravenous Stopped 11/21/22 1624)  azithromycin (ZITHROMAX) 500 mg in sodium chloride 0.9 % 250 mL IVPB (0 mg Intravenous Stopped 11/21/22 1652)  lidocaine-EPINEPHrine (XYLOCAINE W/EPI) 2 %-1:200000 (PF) injection 20 mL (20 mLs Other Not Given 11/21/22 1657)  ondansetron (ZOFRAN) injection 4 mg (0 mg Intravenous Hold 11/21/22 1718)  aspirin EC tablet 81 mg (has no administration in time range)  atorvastatin (LIPITOR) tablet 40 mg (has no administration in time range)  folic acid (FOLVITE) tablet 1 mg (has no administration in time range)  gabapentin (NEURONTIN) capsule 300 mg (300 mg Oral Given 11/21/22 2125)  lamoTRIgine (LAMICTAL) tablet 25 mg (25 mg Oral Given 11/21/22 2125)  melatonin tablet 10 mg (10 mg Oral Given 11/21/22 2125)  pantoprazole (PROTONIX) EC tablet 80 mg (has no administration in time range)  guaiFENesin (ROBITUSSIN) 100 MG/5ML liquid 5 mL (has no administration in time range)  enoxaparin (LOVENOX) injection 40 mg (has no administration in time range)  vancomycin (VANCOREADY) IVPB 1750 mg/350 mL (has no administration in time range)  vancomycin (VANCOCIN) IVPB 1000 mg/200 mL premix (has no administration in time range)  sodium chloride flush  (NS) 0.9 % injection 10  mL (10 mLs Intrapleural Not Given 11/21/22 2133)  insulin aspart (novoLOG) injection 0-15 Units (has no administration in time range)  insulin aspart (novoLOG) injection 0-5 Units (has no administration in time range)  ipratropium-albuterol (DUONEB) 0.5-2.5 (3) MG/3ML nebulizer solution 3 mL (3 mLs Nebulization Given 11/21/22 1536)  sodium chloride 0.9 % bolus 500 mL (0 mLs Intravenous Stopped 11/21/22 1720)  ketamine 50 mg in normal saline 5 mL (10 mg/mL) syringe (43 mg Intravenous Given by Other 11/21/22 1642)  propofol (DIPRIVAN) 10 mg/mL bolus/IV push 42.9 mg (42.9 mg Intravenous Given 11/21/22 1642)  HYDROmorphone (DILAUDID) injection 1 mg (1 mg Intravenous Given 11/21/22 1717)    Mobility walks     Focused Assessments Pulmonary Assessment Handoff:  Lung sounds: Bilateral Breath Sounds: Diminished O2 Device: Nasal Cannula O2 Flow Rate (L/min): 3 L/min    R Recommendations: See Admitting Provider Note  Report given to:   Additional Notes: Rt chest tube with minial drainage, 3L [place] sats 96%. Awaiting vanco from pharmacy

## 2022-11-21 NOTE — ED Triage Notes (Signed)
Pt states he has had a URI x 1 week; pt given steroids Friday, no improvement, worsening over the weekend; endorses fevers, CP with cough

## 2022-11-21 NOTE — Sedation Documentation (Signed)
D/c nrb, pt placed on 3L via [place]

## 2022-11-21 NOTE — Assessment & Plan Note (Signed)
H/o MRSA osteomyelitis and MRSA bacteremia.  Will cover PNA today with vanc for the moment.

## 2022-11-21 NOTE — Progress Notes (Signed)
Pharmacy Antibiotic Note  Louis [name] is a 57 y.o. male admitted on 11/21/2022 with pneumonia.  Pharmacy has been consulted for vancomycin dosing. Patient had URI x 1 week prior to admission. Symptoms worsened and patient presented to the ED requiring chest tube placement. Noted to be COVID positive. PMH significant for MRSA bacteremia with epidural abscess in 2023.  WBC elevated at 22.6, CrCl 84.7 ml/min, afebrile, and tachypneic to 25.   Plan: Give vancomycin 1750 mg load followed by 1000 mg every 12 hours for [facility] of 433 and Csmin of 12 Ceftriaxone and azithromycin per MD Obtain MRSA PCR and monitor cultures to deescalate as indicated   Height: 5\' 11"  (180.3 cm) Weight: 85.7 kg (189 lb) IBW/kg (Calculated) : 75.3  Temp (24hrs), Avg:98.5 F (36.9 C), Min:97.8 F (36.6 C), Max:98.9 F (37.2 C)  Recent Labs  Lab 11/21/22 1500 11/21/22 1516 11/21/22 1915  WBC 22.6*  --   --   CREATININE 1.18 1.00  --   LATICACIDVEN  --   --  1.8    Estimated Creatinine Clearance: 84.7 mL/min (by C-G formula based on SCr of 1 mg/dL).    No Known Allergies  Antimicrobials this admission: Vancomycin 5/28 >>   Ceftriaxone 5/28 >>  Azithromycin 5/28 >>  Microbiology results: 5/28 BCx: IP 5/28 COVID: positive 5/28 MRSA PCR: IP  Thank you for involving pharmacy in this patient's care.  Enos Fling, PharmD PGY2 Pharmacy Resident 11/21/2022 9:13 PM

## 2022-11-21 NOTE — Assessment & Plan Note (Addendum)
Hold home BP meds for the moment due to initial soft BPs in ED. Though starting to run HYPERtensive again, so may need to resume these as soon as tomorrow AM.

## 2022-11-21 NOTE — Progress Notes (Signed)
Elink is following code sepsis 

## 2022-11-21 NOTE — Assessment & Plan Note (Addendum)
Given elevated WBC: concerned about secondary bacterial PNA following recent COVID-19 infection Met initial SIRS criteria with hypotension as well, however this is improved after just 500cc bolus and CT insertion to improve PTx. Suspect initial tachycardia and hypotension may have been due to PTx as opposed to sepsis given the rapid turn around with minimal IVF Continue to monitor closely Rocephin / azithro started in ED Will add Vanc given h/o MRSA bacteremia / osteomyelitis Urine for s.pneumo and legionella MRSA PCR nares Tele monitor

## 2022-11-21 NOTE — Assessment & Plan Note (Addendum)
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 Repeat BMP in AM for now to trend.

## 2022-11-21 NOTE — ED Notes (Signed)
Pt in bed, pt report increased pain, md aware, pain med given

## 2022-11-21 NOTE — Consult Note (Signed)
NAME:  Louis [name], MRN:  [id number], DOB:  08-22-1965, LOS: 0 ADMISSION DATE:  11/21/2022, CONSULTATION DATE:  11/21/22 REFERRING MD:  Tanda Rockers, DO CHIEF COMPLAINT:  Chest tube management   History of Present Illness:  57 year old male former smoker with who presents with acute hypoxemic respiratory failure with SpO2 in low 80s requiring NRB and hypotension with SBP 89/61.  He reports two week history with covid infection from recent sick contacts. Received steroids. However symptoms recurred and worsened in the last 2-3 days with shortness of breath, chest pain, productive cough and fevers. CXR with large right pneumothorax. ED placed right chest tube for tension pneumothorax with near resolution and pigtail in place. Oxygen weaned to NRB to 3L O2. PCCM consulted for chest tube management. Labs significant for +Covid, WBC 22.6  Pertinent  Medical History   CAD, DM2, HTN, HLD, chronic diastolic heart failure, hx epidural abscess/MRSA bacteremia, hx vertebral OM  Significant Hospital Events: Including procedures, antibiotic start and stop dates in addition to other pertinent events     Interim History / Subjective:  As above  Objective   Blood pressure 121/78, pulse 82, temperature 98.9 F (37.2 C), temperature source Oral, resp. rate (!) 25, height 5\' 11"  (1.803 m), weight 85.7 kg, SpO2 97 %.        Intake/Output Summary (Last 24 hours) at 11/21/2022 2042 Last data filed at 11/21/2022 1720 Gross per 24 hour  Intake 847.97 ml  Output --  Net 847.97 ml   Filed Weights   11/21/22 1450  Weight: 85.7 kg   Physical Exam: General: Chronically ill-appearing, no acute distress HENT: [place], AT Eyes: EOMI, no scleral icterus Respiratory: Diminished to auscultation bilaterally.  No crackles, wheezing or rales. Right chest tube in place Cardiovascular: RRR, -M/R/G, no JVD Extremities:-Edema,-tenderness Neuro: AAO x4, CNII-XII grossly intact Psych: Normal mood, normal affect  CXR - right  chest tube in place. Right basilar pneumonia  Assessment & Plan:   Tension pneumothorax s/p right chest tube Secondary to cough, recent covid infection. Doubt active infection Acute hypoxemic respiratory failure 2/2 pneumonia --Agree with broad spectrum coverage for possible co-comitant bacterial pneumonia --F/u cultures, MRSA --Chest tube to suction -20 cm H20 --Flushes per scheduled --F/u pleural labs. Minimal pleural fluid --CXR in am  Labs   CBC: Recent Labs  Lab 11/21/22 1500 11/21/22 1516  WBC 22.6*  --   NEUTROABS 19.5*  --   HGB 17.1* 18.4*  18.7*  HCT 50.6 54.0*  55.0*  MCV 89.9  --   PLT 340  --     Basic Metabolic Panel: Recent Labs  Lab 11/21/22 1500 11/21/22 1516  NA 131* 130*  131*  K 4.7 4.4  4.4  CL 88* 94*  CO2 24  --   GLUCOSE 289* 293*  BUN 19 23*  CREATININE 1.18 1.00  CALCIUM 9.3  --    GFR: Estimated Creatinine Clearance: 84.7 mL/min (by C-G formula based on SCr of 1 mg/dL). Recent Labs  Lab 11/21/22 1500 11/21/22 1915  WBC 22.6*  --   LATICACIDVEN  --  1.8    Liver Function Tests: No results for input(s): "AST", "ALT", "ALKPHOS", "BILITOT", "PROT", "ALBUMIN" in the last 168 hours. No results for input(s): "LIPASE", "AMYLASE" in the last 168 hours. No results for input(s): "AMMONIA" in the last 168 hours.  ABG    Component Value Date/Time   PHART 7.449 12/16/2020 1234   PCO2ART 32.9 12/16/2020 1234   PO2ART 72.7 (L) 12/16/2020  1234   HCO3 27.6 11/21/2022 1516   TCO2 29 11/21/2022 1516   TCO2 27 11/21/2022 1516   ACIDBASEDEF 1.0 12/16/2020 1234   O2SAT 56 11/21/2022 1516     Coagulation Profile: No results for input(s): "INR", "PROTIME" in the last 168 hours.  Cardiac Enzymes: No results for input(s): "CKTOTAL", "CKMB", "CKMBINDEX", "TROPONINI" in the last 168 hours.  HbA1C: Hgb A1c MFr Bld  Date/Time Value Ref Range Status  12/13/2020 12:47 AM 9.1 (H) 4.8 - 5.6 % Final    Comment:    (NOTE)          Prediabetes: 5.7 - 6.4         Diabetes: >6.4         Glycemic control for adults with diabetes: <7.0     CBG: No results for input(s): "GLUCAP" in the last 168 hours.  Review of Systems:   Review of Systems  Constitutional:  Negative for chills, diaphoresis, fever, malaise/fatigue and weight loss.  HENT:  Negative for congestion.   Respiratory:  Positive for cough, sputum production, shortness of breath and wheezing. Negative for hemoptysis.   Cardiovascular:  Negative for chest pain, palpitations and leg swelling.   Past Medical History:  He,  has a past medical history of CAD (coronary artery disease), DM (diabetes mellitus) (HCC), Hyperlipidemia, and Hypertension.   Surgical History:   Past Surgical History:  Procedure Laterality Date   CORONARY ARTERY BYPASS GRAFT     LUMBAR LAMINECTOMY FOR EPIDURAL ABSCESS N/A 12/21/2020   Procedure: Thoracic seven-eight, Thoracic twelve-Lumbar one, Lumbar three-four laminectomy for Epidural Abscess;  Surgeon: Dawley, Alan Mulder, DO;  Location: [facility] OR;  Service: Neurosurgery;  Laterality: N/A;   TEE WITHOUT CARDIOVERSION N/A 01/11/2021   Procedure: TRANSESOPHAGEAL ECHOCARDIOGRAM (TEE);  Surgeon: Lewayne Bunting, MD;  Location: [facility] ENDOSCOPY;  Service: Cardiovascular;  Laterality: N/A;     Social History:   reports that he has been smoking cigarettes. He started smoking about 27 years ago. He has been smoking an average of 2 packs per day. He has never used smokeless tobacco. He reports that he does not currently use alcohol. He reports that he does not use drugs.   Family History:  His family history is not on file.   Allergies No Known Allergies   Home Medications  Prior to Admission medications   Medication Sig Start Date End Date Taking? Authorizing Provider  acetaminophen (TYLENOL) 325 MG tablet Take 1-2 tablets (325-650 mg total) by mouth every 4 (four) hours as needed for mild pain. 02/17/21  Yes Love, Evlyn Kanner, PA-C  amLODipine  (NORVASC) 10 MG tablet Take 1 tablet (10 mg total) by mouth daily. 02/19/21  Yes Love, Evlyn Kanner, PA-C  aspirin EC 81 MG tablet Take 81 mg by mouth daily. Swallow whole.   Yes [provider]  atorvastatin (LIPITOR) 40 MG tablet Take 40 mg by mouth daily. 08/16/22  Yes [provider]  folic acid (FOLVITE) 1 MG tablet Take 1 tablet (1 mg total) by mouth daily. 02/19/21  Yes Love, Evlyn Kanner, PA-C  furosemide (LASIX) 20 MG tablet Take 1 tablet (20 mg total) by mouth daily. 02/21/21  Yes Lovorn, Aundra Millet, MD  gabapentin (NEURONTIN) 300 MG capsule Take 1 capsule (300 mg total) by mouth 3 (three) times daily. Patient taking differently: Take 300 mg by mouth 2 (two) times daily. 02/20/21  Yes Lovorn, Aundra Millet, MD  glipiZIDE (GLUCOTROL XL) 5 MG 24 hr tablet Take 5 mg by mouth daily. 08/28/22  Yes [provider]  guaifenesin (ROBITUSSIN) 100 MG/5ML syrup Take 100 mg by mouth 3 (three) times daily as needed for cough. SO unsure of dose.   Yes [provider]  lamoTRIgine (LAMICTAL) 25 MG tablet Take 25 mg by mouth 2 (two) times daily. 08/25/22  Yes [provider]  losartan (COZAAR) 25 MG tablet Take 0.5 tablets (12.5 mg total) by mouth every morning. 02/18/21  Yes Love, Evlyn Kanner, PA-C  metoprolol tartrate (LOPRESSOR) 25 MG tablet Take 25 mg by mouth daily. 09/21/22  Yes [provider]  omeprazole (PRILOSEC) 40 MG capsule Take 40 mg by mouth daily. 09/14/22  Yes [provider]     Critical care time: N/A   MDM: Moderate  Mechele Collin, M.D. [facility] Pulmonary/Critical Care Medicine 11/21/2022 8:42 PM   Please see Amion for pager number to reach on-call Pulmonary and Critical Care Team.

## 2022-11-21 NOTE — Assessment & Plan Note (Addendum)
SSI mod scale AC/HS for now Hold home glipizide. Recent control likely worsened by prednisone course.

## 2022-11-21 NOTE — Assessment & Plan Note (Addendum)
Improved s/p CT. PCCM managing CT Dilaudid PRN pain

## 2022-11-21 NOTE — ED Notes (Signed)
Pt in bed, pt reports decreased pain, pt appears to be breathing better

## 2022-11-22 ENCOUNTER — Inpatient Hospital Stay (HOSPITAL_COMMUNITY): Payer: Federal, State, Local not specified - PPO

## 2022-11-22 DIAGNOSIS — J15212 Pneumonia due to Methicillin resistant Staphylococcus aureus: Secondary | ICD-10-CM | POA: Diagnosis not present

## 2022-11-22 DIAGNOSIS — J189 Pneumonia, unspecified organism: Secondary | ICD-10-CM | POA: Diagnosis not present

## 2022-11-22 DIAGNOSIS — J9312 Secondary spontaneous pneumothorax: Secondary | ICD-10-CM | POA: Diagnosis not present

## 2022-11-22 DIAGNOSIS — Z8616 Personal history of COVID-19: Secondary | ICD-10-CM | POA: Diagnosis not present

## 2022-11-22 DIAGNOSIS — G928 Other toxic encephalopathy: Secondary | ICD-10-CM | POA: Diagnosis not present

## 2022-11-22 DIAGNOSIS — J939 Pneumothorax, unspecified: Secondary | ICD-10-CM | POA: Diagnosis not present

## 2022-11-22 DIAGNOSIS — Z20822 Contact with and (suspected) exposure to covid-19: Secondary | ICD-10-CM | POA: Diagnosis not present

## 2022-11-22 LAB — BASIC METABOLIC PANEL
Anion gap: 10 (ref 5–15)
Anion gap: 12 (ref 5–15)
BUN: 20 mg/dL (ref 6–20)
BUN: 16 mg/dL (ref 6–20)
CO2: 24 mmol/L (ref 22–32)
CO2: 24 mmol/L (ref 22–32)
Calcium: 8.1 mg/dL — ABNORMAL LOW (ref 8.9–10.3)
Calcium: 8.3 mg/dL — ABNORMAL LOW (ref 8.9–10.3)
Chloride: 94 mmol/L — ABNORMAL LOW (ref 98–111)
Chloride: 94 mmol/L — ABNORMAL LOW (ref 98–111)
Creatinine, Ser: 1.07 mg/dL (ref 0.61–1.24)
Creatinine, Ser: 0.83 mg/dL (ref 0.61–1.24)
GFR, Estimated: >60 mL/min (ref >60)
GFR, Estimated: >60 mL/min (ref >60)
Glucose, Bld: 234 mg/dL — ABNORMAL HIGH (ref 70–99)
Glucose, Bld: 241 mg/dL — ABNORMAL HIGH (ref 70–99)
Potassium: 4.2 mmol/L (ref 3.5–5.1)
Potassium: 4.2 mmol/L (ref 3.5–5.1)
Sodium: 128 mmol/L — ABNORMAL LOW (ref 135–145)
Sodium: 130 mmol/L — ABNORMAL LOW (ref 135–145)

## 2022-11-22 LAB — MRSA NEXT GEN BY PCR, NASAL: MRSA by PCR Next Gen: DETECTED — AB

## 2022-11-22 LAB — PROTEIN, TOTAL: Total Protein: 5.9 g/dL — ABNORMAL LOW (ref 6.5–8.1)

## 2022-11-22 LAB — PROTIME-INR
INR: 1.1 (ref 0.8–1.2)
Prothrombin Time: 14.8 seconds (ref 11.4–15.2)

## 2022-11-22 LAB — LACTATE DEHYDROGENASE: LDH: 155 U/L (ref 98–192)

## 2022-11-22 LAB — OSMOLALITY: Osmolality: 281 mOsm/kg (ref 275–295)

## 2022-11-22 LAB — CBC
HCT: 51.4 % (ref 39.0–52.0)
Hemoglobin: 17.6 g/dL — ABNORMAL HIGH (ref 13.0–17.0)
MCH: 31.1 pg (ref 26.0–34.0)
MCHC: 34.2 g/dL (ref 30.0–36.0)
MCV: 90.8 fL (ref 80.0–100.0)
Platelets: 246 10*3/uL (ref 150–400)
RBC: 5.66 MIL/uL (ref 4.22–5.81)
RDW: 13.8 % (ref 11.5–15.5)
WBC: 18.8 10*3/uL — ABNORMAL HIGH (ref 4.0–10.5)
nRBC: 0 % (ref 0.0–0.2)

## 2022-11-22 LAB — GLUCOSE, CAPILLARY
Glucose-Capillary: 265 mg/dL — ABNORMAL HIGH (ref 70–99)
Glucose-Capillary: 212 mg/dL — ABNORMAL HIGH (ref 70–99)
Glucose-Capillary: 274 mg/dL — ABNORMAL HIGH (ref 70–99)
Glucose-Capillary: 155 mg/dL — ABNORMAL HIGH (ref 70–99)

## 2022-11-22 LAB — APTT: aPTT: 24 seconds (ref 24–36)

## 2022-11-22 LAB — LACTIC ACID, PLASMA: Lactic Acid, Venous: 1.2 mmol/L (ref 0.5–1.9)

## 2022-11-22 LAB — BODY FLUID CULTURE W GRAM STAIN

## 2022-11-22 LAB — MAGNESIUM: Magnesium: 2.1 mg/dL (ref 1.7–2.4)

## 2022-11-22 LAB — HIV ANTIBODY (ROUTINE TESTING W REFLEX): HIV Screen 4th Generation wRfx: NONREACTIVE

## 2022-11-22 MED ORDER — ACETAMINOPHEN 325 MG PO TABS
650.0000 mg | ORAL_TABLET | Freq: Four times a day (QID) | ORAL | Status: DC | PRN
  Administered 2022-11-22 (×2): 650 mg via ORAL
  Filled 2022-11-22 (×2): qty 2

## 2022-11-22 MED ORDER — HYDROMORPHONE HCL 1 MG/ML IJ SOLN
1.0000 mg | Freq: Once | INTRAMUSCULAR | Status: AC
  Administered 2022-11-22: 1 mg via INTRAVENOUS
  Filled 2022-11-22: qty 1

## 2022-11-22 MED ORDER — CHLORHEXIDINE GLUCONATE CLOTH 2 % EX PADS
6.0000 | MEDICATED_PAD | Freq: Every day | CUTANEOUS | Status: AC
  Administered 2022-11-22 – 2022-11-26 (×5): 6 via TOPICAL

## 2022-11-22 MED ORDER — GLUCERNA SHAKE PO LIQD
237.0000 mL | Freq: Three times a day (TID) | ORAL | Status: DC
  Administered 2022-11-22 – 2022-11-23 (×2): 237 mL via ORAL

## 2022-11-22 MED ORDER — MUPIROCIN 2 % EX OINT
1.0000 | TOPICAL_OINTMENT | Freq: Two times a day (BID) | CUTANEOUS | Status: AC
  Administered 2022-11-22 – 2022-11-26 (×9): 1 via NASAL
  Filled 2022-11-22: qty 22

## 2022-11-22 NOTE — Progress Notes (Signed)
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   May 1 week COVID-positive.  Did not receive any therapy. May last week recurrence of shortness of breath and cough.  Treated outpatient with antibiotics and prednisone.  Found to have large right pneumothorax as well as Staph aureus pneumonia. Assessment and Plan: Staph aureus pneumonia. Sensitivity currently pending. Continue antibiotic for now.  Tension pneumothorax. SP chest tube placement. Management per PCCM.  Acute hypoxic respiratory failure. Secondary to above. Monitor for improvement in oxygenation after resolution of the pneumothorax.  Recent COVID infection. Patient's COVID test was positive this admission.  CT value 37. Presentation is mostly secondary to Staph aureus bacteria pneumonia. Had a COVID infection early May. At present does not require any isolation.  Hyponatremia/SIADH. Sodium level stable. Monitor.  Type 2 diabetes mellitus, uncontrolled with hyperglycemia without long-term insulin use and complication. Currently on sliding scale insulin.  HTN. Blood pressure stable. Currently holding home regimen. Subjective: Feeling fatigue and tired.  No nausea no vomiting no fever no chills.  No dizziness.  Physical Exam: General: in Mild distress, No Rash Cardiovascular: S1 and S2 Present, No Murmur Respiratory: Good respiratory effort, Bilateral Air entry present.  Bilateral crackles, No wheezes Abdomen: Bowel Sound present, No tenderness Extremities: No edema Neuro: Alert and oriented x3, no new focal deficit  Data Reviewed: I have Reviewed nursing notes, Vitals, and Lab results. Since last encounter, pertinent lab results CBC and BMP   . I have ordered test including CBC and BMP  .   Disposition: Status is:  Inpatient Remains inpatient appropriate because: Need for IV antibiotics and chest tube management  enoxaparin (LOVENOX) injection 40 mg Start: 11/22/22 1000   Family Communication: No one at bedside Level of care: Progressive  Vitals:   11/22/22 1200 11/22/22 1400 11/22/22 1500 11/22/22 1600  BP: 124/74 127/82 109/73 128/86  Pulse: 99 93 83 92  Resp: (!) 23 (!) 21 17 18   Temp:      TempSrc:      SpO2: 91% 90% 93% 91%  Weight:      Height:         Author: Lynden Oxford, MD 11/22/2022 5:52 PM  Please look on [email or web address] to find out who is on call.

## 2022-11-22 NOTE — Plan of Care (Signed)
Problem: Education: Goal: Ability to describe self-care measures that may prevent or decrease complications (Diabetes Survival Skills Education) will improve 11/22/2022 2310 by Hermela Hardt, Gilford Raid, RN Outcome: Progressing 11/22/2022 2306 by Thea Gist, RN Outcome: Progressing Goal: Individualized Educational Video(s) 11/22/2022 2310 by Thea Gist, RN Outcome: Progressing 11/22/2022 2306 by Thea Gist, RN Outcome: Progressing   Problem: Coping: Goal: Ability to adjust to condition or change in health will improve 11/22/2022 2310 by Brytnee Bechler, Gilford Raid, RN Outcome: Progressing 11/22/2022 2306 by Thea Gist, RN Outcome: Progressing   Problem: Fluid Volume: Goal: Ability to maintain a balanced intake and output will improve 11/22/2022 2310 by Liel Rudden, Gilford Raid, RN Outcome: Progressing 11/22/2022 2306 by Thea Gist, RN Outcome: Progressing   Problem: Health Behavior/Discharge Planning: Goal: Ability to identify and utilize available resources and services will improve 11/22/2022 2310 by Taryn Nave, Gilford Raid, RN Outcome: Progressing 11/22/2022 2306 by Thea Gist, RN Outcome: Progressing Goal: Ability to manage health-related needs will improve 11/22/2022 2310 by Marsden Zaino, Gilford Raid, RN Outcome: Progressing 11/22/2022 2306 by Thea Gist, RN Outcome: Progressing   Problem: Metabolic: Goal: Ability to maintain appropriate glucose levels will improve 11/22/2022 2310 by Tram Wrenn, Gilford Raid, RN Outcome: Progressing 11/22/2022 2306 by Thea Gist, RN Outcome: Progressing   Problem: Nutritional: Goal: Maintenance of adequate nutrition will improve 11/22/2022 2310 by Toriano Aikey, Gilford Raid, RN Outcome: Progressing 11/22/2022 2306 by Thea Gist, RN Outcome: Progressing Goal: Progress toward achieving an optimal weight will improve 11/22/2022 2310 by Deval Mroczka, Gilford Raid, RN Outcome: Progressing 11/22/2022 2306 by Thea Gist,  RN Outcome: Progressing   Problem: Skin Integrity: Goal: Risk for impaired skin integrity will decrease 11/22/2022 2310 by Princessa Lesmeister, Gilford Raid, RN Outcome: Progressing 11/22/2022 2306 by Thea Gist, RN Outcome: Progressing   Problem: Tissue Perfusion: Goal: Adequacy of tissue perfusion will improve 11/22/2022 2310 by Malakhai Beitler, Gilford Raid, RN Outcome: Progressing 11/22/2022 2306 by Thea Gist, RN Outcome: Progressing   Problem: Education: Goal: Knowledge of General Education information will improve Description: Including pain rating scale, medication(s)/side effects and non-pharmacologic comfort measures 11/22/2022 2310 by Oreoluwa Gilmer, Gilford Raid, RN Outcome: Progressing 11/22/2022 2306 by Thea Gist, RN Outcome: Progressing   Problem: Health Behavior/Discharge Planning: Goal: Ability to manage health-related needs will improve 11/22/2022 2310 by Emali Heyward, Gilford Raid, RN Outcome: Progressing 11/22/2022 2306 by Thea Gist, RN Outcome: Progressing   Problem: Clinical Measurements: Goal: Ability to maintain clinical measurements within normal limits will improve 11/22/2022 2310 by Reford Olliff, Gilford Raid, RN Outcome: Progressing 11/22/2022 2306 by Thea Gist, RN Outcome: Progressing Goal: Will remain free from infection 11/22/2022 2310 by Kebin Maye, Gilford Raid, RN Outcome: Progressing 11/22/2022 2306 by Thea Gist, RN Outcome: Progressing Goal: Diagnostic test results will improve 11/22/2022 2310 by Camden Knotek, Gilford Raid, RN Outcome: Progressing 11/22/2022 2306 by Thea Gist, RN Outcome: Progressing Goal: Respiratory complications will improve 11/22/2022 2310 by Malana Eberwein, Gilford Raid, RN Outcome: Progressing 11/22/2022 2306 by Thea Gist, RN Outcome: Progressing Goal: Cardiovascular complication will be avoided 11/22/2022 2310 by Abbigaile Rockman, Gilford Raid, RN Outcome: Progressing 11/22/2022 2306 by Thea Gist, RN Outcome: Progressing    Problem: Activity: Goal: Risk for activity intolerance will decrease 11/22/2022 2310 by Jais Demir, Gilford Raid, RN Outcome: Progressing 11/22/2022 2306 by Thea Gist, RN Outcome: Progressing   Problem: Nutrition: Goal: Adequate nutrition will be maintained 11/22/2022 2310 by Thea Gist, RN Outcome: Progressing 11/22/2022 2306 by Thea Gist, RN Outcome: Progressing  Problem: Coping: Goal: Level of anxiety will decrease 11/22/2022 2310 by Mira Balon, Gilford Raid, RN Outcome: Progressing 11/22/2022 2306 by Thea Gist, RN Outcome: Progressing   Problem: Elimination: Goal: Will not experience complications related to bowel motility 11/22/2022 2310 by Ellee Wawrzyniak, Gilford Raid, RN Outcome: Progressing 11/22/2022 2306 by Thea Gist, RN Outcome: Progressing Goal: Will not experience complications related to urinary retention 11/22/2022 2310 by Amee Boothe, Gilford Raid, RN Outcome: Progressing 11/22/2022 2306 by Thea Gist, RN Outcome: Progressing   Problem: Pain Managment: Goal: General experience of comfort will improve 11/22/2022 2310 by Roston Grunewald, Gilford Raid, RN Outcome: Progressing 11/22/2022 2306 by Thea Gist, RN Outcome: Progressing   Problem: Safety: Goal: Ability to remain free from injury will improve 11/22/2022 2310 by Neils Siracusa, Gilford Raid, RN Outcome: Progressing 11/22/2022 2306 by Thea Gist, RN Outcome: Progressing

## 2022-11-22 NOTE — Progress Notes (Signed)
   NAME:  Louis [name], MRN:  [id number], DOB:  1966-05-17, LOS: 1 ADMISSION DATE:  11/21/2022, CONSULTATION DATE: 11/21/2022 REFERRING MD: Tanda Rockers, CHIEF COMPLAINT: Chest tube management  History of Present Illness:  57 year old male former smoker with who presents with acute hypoxemic respiratory failure with SpO2 in low 80s requiring NRB and hypotension with SBP 89/61.   He reports two week history with covid infection from recent sick contacts. Received steroids. However symptoms recurred and worsened in the last 2-3 days with shortness of breath, chest pain, productive cough and fevers. CXR with large right pneumothorax. ED placed right chest tube for tension pneumothorax with near resolution and pigtail in place. Oxygen weaned to NRB to 3L O2. PCCM consulted for chest tube management. Labs significant for +Covid, WBC 22.6  Pertinent  Medical History  CAD, DM2, HTN, HLD, chronic diastolic heart failure, hx epidural abscess/MRSA bacteremia, hx vertebral OM   Significant Hospital Events: Including procedures, antibiotic start and stop dates in addition to other pertinent events   5/28 chest x-ray with full reexpansion of the lung  Interim History / Subjective:  Pain discomfort at chest tube site  Objective   Blood pressure 122/77, pulse 87, temperature 98.7 F (37.1 C), temperature source Oral, resp. rate 19, height 5\' 11"  (1.803 m), weight 86.8 kg, SpO2 94 %.        Intake/Output Summary (Last 24 hours) at 11/22/2022 1158 Last data filed at 11/22/2022 1128 Gross per 24 hour  Intake 1934.86 ml  Output 638 ml  Net 1296.86 ml   Filed Weights   11/21/22 1450 11/21/22 2238  Weight: 85.7 kg 86.8 kg    Examination: General: Chronically ill-appearing, does not appear to be in acute distress HENT: Moist oral mucosa Lungs: Decreased air entry bilaterally with no rales, right chest tube in place Cardiovascular: S1-S2 appreciated with no murmur Abdomen: Bowel sounds  appreciated Extremities: No clubbing, no edema Neuro: Alert and oriented x 3 GU:   Resolved Hospital Problem list     Assessment & Plan:  Patient with tension pneumothorax status post chest tube placement No airleak currently from chest tube with chest x-ray showing complete reexpansion  Acute hypoxemic respiratory failure secondary to pneumonia -Continue broad-spectrum antibiotics -Follow-up cultures  Continue chest tube flushes  Suction decreased to 10 cm  Chest x-ray ordered for a.m.  With underlying pathology may need chest tube in place for 48-72 hours  Will continue to follow with you  Virl Diamond, MD [place] PCCM Pager: See Loretha Stapler

## 2022-11-22 NOTE — Plan of Care (Signed)
  Problem: Education: Goal: Ability to describe self-care measures that may prevent or decrease complications (Diabetes Survival Skills Education) will improve Outcome: Progressing Goal: Individualized Educational Video(s) Outcome: Progressing   Problem: Coping: Goal: Ability to adjust to condition or change in health will improve Outcome: Progressing   Problem: Fluid Volume: Goal: Ability to maintain a balanced intake and output will improve Outcome: Progressing   Problem: Health Behavior/Discharge Planning: Goal: Ability to identify and utilize available resources and services will improve Outcome: Progressing Goal: Ability to manage health-related needs will improve Outcome: Progressing   Problem: Metabolic: Goal: Ability to maintain appropriate glucose levels will improve Outcome: Progressing   Problem: Nutritional: Goal: Maintenance of adequate nutrition will improve Outcome: Progressing Goal: Progress toward achieving an optimal weight will improve Outcome: Progressing   Problem: Skin Integrity: Goal: Risk for impaired skin integrity will decrease Outcome: Progressing   Problem: Tissue Perfusion: Goal: Adequacy of tissue perfusion will improve Outcome: Progressing   Problem: Education: Goal: Knowledge of General Education information will improve Description: Including pain rating scale, medication(s)/side effects and non-pharmacologic comfort measures Outcome: Progressing   Problem: Clinical Measurements: Goal: Ability to maintain clinical measurements within normal limits will improve Outcome: Progressing Goal: Will remain free from infection Outcome: Progressing Goal: Diagnostic test results will improve Outcome: Progressing Goal: Respiratory complications will improve Outcome: Progressing Goal: Cardiovascular complication will be avoided Outcome: Progressing   Problem: Activity: Goal: Risk for activity intolerance will decrease Outcome: Progressing    Problem: Nutrition: Goal: Adequate nutrition will be maintained Outcome: Progressing   Problem: Coping: Goal: Level of anxiety will decrease Outcome: Progressing   Problem: Elimination: Goal: Will not experience complications related to bowel motility Outcome: Progressing Goal: Will not experience complications related to urinary retention Outcome: Progressing   Problem: Pain Managment: Goal: General experience of comfort will improve Outcome: Progressing   Problem: Safety: Goal: Ability to remain free from injury will improve Outcome: Progressing   Problem: Skin Integrity: Goal: Risk for impaired skin integrity will decrease Outcome: Progressing   

## 2022-11-22 NOTE — Plan of Care (Signed)
  Problem: Clinical Measurements: Goal: Ability to maintain clinical measurements within normal limits will improve Outcome: Progressing Goal: Will remain free from infection Outcome: Progressing Goal: Diagnostic test results will improve Outcome: Progressing Goal: Respiratory complications will improve Outcome: Progressing Goal: Cardiovascular complication will be avoided Outcome: Progressing   Problem: Activity: Goal: Risk for activity intolerance will decrease Outcome: Progressing   Problem: Coping: Goal: Level of anxiety will decrease Outcome: Progressing   Problem: Elimination: Goal: Will not experience complications related to urinary retention Outcome: Progressing   Problem: Pain Managment: Goal: General experience of comfort will improve Outcome: Progressing   Problem: Safety: Goal: Ability to remain free from injury will improve Outcome: Progressing   

## 2022-11-22 NOTE — Inpatient Diabetes Management (Addendum)
Inpatient Diabetes Program Recommendations  AACE/ADA: New Consensus Statement on Inpatient Glycemic Control (2015)  Target Ranges:  Prepandial:   less than 140 mg/dL      Peak postprandial:   less than 180 mg/dL (1-2 hours)      Critically ill patients:  140 - 180 mg/dL   Lab Results  Component Value Date   GLUCAP 265 (H) 11/22/2022   HGBA1C 9.1 (H) 12/13/2020    Review of Glycemic Control  Latest Reference Range & Units 11/22/22 05:53  Glucose-Capillary 70 - 99 mg/dL 045 (H)  (H): Data is abnormally high Diabetes history: Type 2 DM Outpatient Diabetes medications: Glipizide 5 mg QD Current orders for Inpatient glycemic control: Novolog 0-15 units TID & HS  Inpatient Diabetes Program Recommendations:     Consider: -Semglee 12 units QD  Thanks, [name], MSN, RNC-OB Diabetes Coordinator [phone] (8a-5p)

## 2022-11-22 NOTE — Hospital Course (Signed)
HTN, DM2, SIADH, MRSA bacteremia and osteomyelitis not on chronic ABx.   May 1 week COVID-positive.  Did not receive any therapy. May last week recurrence of shortness of breath and cough.  Treated outpatient with antibiotics and prednisone.  Found to have large right pneumothorax as well as Staph aureus pneumonia.

## 2022-11-22 NOTE — TOC CM/SW Note (Signed)
Transition of Care [facility]) - Inpatient Brief Assessment   Patient Details  Name: Louis [name] MRN: [id number] Date of Birth: 27-Apr-1966  Transition of [facility]) CM/SW Contact:    Harriet Masson, RN Phone Number: 11/22/2022, 9:05 AM   Clinical Narrative:  Patient admitted for CAP (community acquired pneumonia). Lives with wife. TOC following.   Transition of Care Asessment: Insurance and Status: Insurance coverage has been reviewed Patient has primary care physician: Yes Home environment has been reviewed: safe to discharge home when medically stable Prior level of function:: independent Prior/Current Home Services: No current home services Social Determinants of Health Reivew: SDOH reviewed no interventions necessary Readmission risk has been reviewed: Yes Transition of care needs: no transition of care needs at this time

## 2022-11-23 ENCOUNTER — Inpatient Hospital Stay (HOSPITAL_COMMUNITY): Payer: Federal, State, Local not specified - PPO

## 2022-11-23 DIAGNOSIS — J9312 Secondary spontaneous pneumothorax: Secondary | ICD-10-CM | POA: Diagnosis not present

## 2022-11-23 DIAGNOSIS — J90 Pleural effusion, not elsewhere classified: Secondary | ICD-10-CM | POA: Diagnosis not present

## 2022-11-23 DIAGNOSIS — J939 Pneumothorax, unspecified: Secondary | ICD-10-CM | POA: Diagnosis not present

## 2022-11-23 DIAGNOSIS — J15212 Pneumonia due to Methicillin resistant Staphylococcus aureus: Secondary | ICD-10-CM | POA: Diagnosis not present

## 2022-11-23 DIAGNOSIS — J189 Pneumonia, unspecified organism: Secondary | ICD-10-CM | POA: Diagnosis not present

## 2022-11-23 DIAGNOSIS — G928 Other toxic encephalopathy: Secondary | ICD-10-CM | POA: Diagnosis not present

## 2022-11-23 DIAGNOSIS — Z20822 Contact with and (suspected) exposure to covid-19: Secondary | ICD-10-CM | POA: Diagnosis not present

## 2022-11-23 DIAGNOSIS — Z8616 Personal history of COVID-19: Secondary | ICD-10-CM | POA: Diagnosis not present

## 2022-11-23 LAB — BASIC METABOLIC PANEL
Anion gap: 9 (ref 5–15)
BUN: 12 mg/dL (ref 6–20)
CO2: 26 mmol/L (ref 22–32)
Calcium: 8 mg/dL — ABNORMAL LOW (ref 8.9–10.3)
Chloride: 93 mmol/L — ABNORMAL LOW (ref 98–111)
Creatinine, Ser: 0.87 mg/dL (ref 0.61–1.24)
GFR, Estimated: >60 mL/min (ref >60)
Glucose, Bld: 225 mg/dL — ABNORMAL HIGH (ref 70–99)
Potassium: 4.3 mmol/L (ref 3.5–5.1)
Sodium: 128 mmol/L — ABNORMAL LOW (ref 135–145)

## 2022-11-23 LAB — CBC
HCT: 47.8 % (ref 39.0–52.0)
Hemoglobin: 16.3 g/dL (ref 13.0–17.0)
MCH: 31.2 pg (ref 26.0–34.0)
MCHC: 34.1 g/dL (ref 30.0–36.0)
MCV: 91.4 fL (ref 80.0–100.0)
Platelets: 261 10*3/uL (ref 150–400)
RBC: 5.23 MIL/uL (ref 4.22–5.81)
RDW: 13.9 % (ref 11.5–15.5)
WBC: 15.4 10*3/uL — ABNORMAL HIGH (ref 4.0–10.5)
nRBC: 0 % (ref 0.0–0.2)

## 2022-11-23 LAB — CYTOLOGY - NON PAP

## 2022-11-23 LAB — CULTURE, BLOOD (ROUTINE X 2): Special Requests: ADEQUATE

## 2022-11-23 LAB — HEMOGLOBIN A1C
Hgb A1c MFr Bld: 9 % — ABNORMAL HIGH (ref 4.8–5.6)
Mean Plasma Glucose: 212 mg/dL

## 2022-11-23 LAB — GLUCOSE, CAPILLARY
Glucose-Capillary: 202 mg/dL — ABNORMAL HIGH (ref 70–99)
Glucose-Capillary: 199 mg/dL — ABNORMAL HIGH (ref 70–99)
Glucose-Capillary: 237 mg/dL — ABNORMAL HIGH (ref 70–99)
Glucose-Capillary: 280 mg/dL — ABNORMAL HIGH (ref 70–99)

## 2022-11-23 LAB — LEGIONELLA PNEUMOPHILA SEROGP 1 UR AG: L. pneumophila Serogp 1 Ur Ag: NEGATIVE

## 2022-11-23 LAB — MAGNESIUM: Magnesium: 2.1 mg/dL (ref 1.7–2.4)

## 2022-11-23 MED ORDER — OXYCODONE-ACETAMINOPHEN 5-325 MG PO TABS
2.0000 | ORAL_TABLET | Freq: Four times a day (QID) | ORAL | Status: DC | PRN
  Administered 2022-11-23 – 2022-11-25 (×5): 2 via ORAL
  Filled 2022-11-23 (×5): qty 2

## 2022-11-23 MED ORDER — BENZONATATE 100 MG PO CAPS
100.0000 mg | ORAL_CAPSULE | Freq: Three times a day (TID) | ORAL | Status: DC
  Administered 2022-11-23 – 2022-12-01 (×25): 100 mg via ORAL
  Filled 2022-11-23 (×25): qty 1

## 2022-11-23 MED ORDER — GUAIFENESIN ER 600 MG PO TB12
600.0000 mg | ORAL_TABLET | Freq: Two times a day (BID) | ORAL | Status: DC
  Administered 2022-11-23 – 2022-12-01 (×17): 600 mg via ORAL
  Filled 2022-11-23 (×17): qty 1

## 2022-11-23 MED ORDER — GABAPENTIN 300 MG PO CAPS
300.0000 mg | ORAL_CAPSULE | Freq: Three times a day (TID) | ORAL | Status: DC
  Administered 2022-11-23 – 2022-11-25 (×9): 300 mg via ORAL
  Filled 2022-11-23 (×9): qty 1

## 2022-11-23 MED ORDER — INSULIN GLARGINE-YFGN 100 UNIT/ML ~~LOC~~ SOLN
8.0000 [IU] | Freq: Every day | SUBCUTANEOUS | Status: DC
  Administered 2022-11-23 – 2022-11-24 (×2): 8 [IU] via SUBCUTANEOUS
  Filled 2022-11-23 (×2): qty 0.08

## 2022-11-23 MED ORDER — DEXTROMETHORPHAN POLISTIREX ER 30 MG/5ML PO SUER
30.0000 mg | Freq: Two times a day (BID) | ORAL | Status: DC
  Filled 2022-11-23: qty 5

## 2022-11-23 MED ORDER — HYDROCOD POLI-CHLORPHE POLI ER 10-8 MG/5ML PO SUER
5.0000 mL | Freq: Two times a day (BID) | ORAL | Status: DC
  Administered 2022-11-23 – 2022-11-25 (×6): 5 mL via ORAL
  Filled 2022-11-23 (×6): qty 5

## 2022-11-23 MED ORDER — ONDANSETRON HCL 4 MG/2ML IJ SOLN
4.0000 mg | Freq: Four times a day (QID) | INTRAMUSCULAR | Status: DC | PRN
  Administered 2022-11-28: 4 mg via INTRAVENOUS
  Filled 2022-11-23: qty 2

## 2022-11-23 MED ORDER — DOCUSATE SODIUM 100 MG PO CAPS
100.0000 mg | ORAL_CAPSULE | Freq: Every day | ORAL | Status: DC
  Administered 2022-11-24 – 2022-11-29 (×6): 100 mg via ORAL
  Filled 2022-11-23 (×9): qty 1

## 2022-11-23 MED ORDER — METOPROLOL TARTRATE 5 MG/5ML IV SOLN
5.0000 mg | Freq: Once | INTRAVENOUS | Status: AC
  Administered 2022-11-23: 5 mg via INTRAVENOUS
  Filled 2022-11-23: qty 5

## 2022-11-23 NOTE — Progress Notes (Signed)
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   May 1 week COVID-positive.  Did not receive any therapy. May last week recurrence of shortness of breath and cough.  Treated outpatient with antibiotics and prednisone.  Found to have large right pneumothorax as well as Staph aureus pneumonia. Assessment and Plan: Staph aureus pneumonia. Presents with symptoms of upper respiratory infection ongoing for last 1 week treated with outpatient antibiotics and steroids without any improvement. Found to have tension pneumothorax at the time of admission. Pleural fluid culture growing Staph aureus sensitivity currently pending. Continue antibiotic for now.  On vancomycin  Tension pneumothorax. SP chest tube placement. Management per PCCM.  Will add cough suppression medication.  Acute hypoxic respiratory failure. Secondary to above. Monitor for improvement in oxygenation after resolution of the pneumothorax.  Recent COVID infection. Patient's COVID test was positive this admission.  CT value 37. Presentation is mostly secondary to Staph aureus bacteria pneumonia. Had a COVID infection early May. At present does not require any isolation.  Hyponatremia/SIADH. Sodium level stable. Monitor.  Type 2 diabetes mellitus, uncontrolled with hyperglycemia without long-term insulin use and complication. Currently on sliding scale insulin.  HTN. Blood pressure stable. Currently holding home regimen.  Subjective: Reports severe cough.  No nausea no vomiting no fever no chills.  Reports pain in the chest secondary chest tube secondary to cough. No BM so far.  Physical Exam: In moderate distress. No rash. S1-S2 present. Bilateral basal crackles. No wheezing heard. No edema.  Data Reviewed: I have  Reviewed nursing notes, Vitals, and Lab results. Since last encounter, pertinent lab results CBC and BMP   . I have ordered test including CBC and BMP  .    Disposition: Status is: Inpatient Remains inpatient appropriate because: Need for IV antibiotics and chest tube management  enoxaparin (LOVENOX) injection 40 mg Start: 11/22/22 1000   Family Communication: No one at bedside Level of care: Progressive  Vitals:   11/23/22 1134 11/23/22 1459 11/23/22 1600 11/23/22 1611  BP: 129/77 123/88 114/66   Pulse: 99 (!) 105 (!) 102 (!) 103  Resp: 15 12 (!) 22 16  Temp: 97.7 F (36.5 C) 98.1 F (36.7 C)    TempSrc: Oral Oral    SpO2: 97% 100%    Weight:      Height:         Author: Lynden Oxford, MD 11/23/2022 5:52 PM  Please look on [email or web address] to find out who is on call.

## 2022-11-23 NOTE — Progress Notes (Signed)
   NAME:  Louis [name], MRN:  [id number], DOB:  Dec 18, 1965, LOS: 2 ADMISSION DATE:  11/21/2022, CONSULTATION DATE: 11/21/2022 REFERRING MD: Tanda Rockers, CHIEF COMPLAINT: Chest tube management  History of Present Illness:  57 year old male former smoker with who presents with acute hypoxemic respiratory failure with SpO2 in low 80s requiring NRB and hypotension with SBP 89/61.   He reports two week history with covid infection from recent sick contacts. Received steroids. However symptoms recurred and worsened in the last 2-3 days with shortness of breath, chest pain, productive cough and fevers. CXR with large right pneumothorax. ED placed right chest tube for tension pneumothorax with near resolution and pigtail in place. Oxygen weaned to NRB to 3L O2. PCCM consulted for chest tube management. Labs significant for +Covid, WBC 22.6  Pertinent  Medical History  CAD, DM2, HTN, HLD, chronic diastolic heart failure, hx epidural abscess/MRSA bacteremia, hx vertebral OM   Significant Hospital Events: Including procedures, antibiotic start and stop dates in addition to other pertinent events   5/28 chest x-ray with full reexpansion of the lung 5/30-Chest x-ray with reexpansion of the lung but does have some consolidation at the base  Interim History / Subjective:  Pain discomfort at chest tube site  Objective   Blood pressure 103/84, pulse 100, temperature 98.5 F (36.9 C), temperature source Oral, resp. rate 18, height 5\' 11"  (1.803 m), weight 86.8 kg, SpO2 99 %.        Intake/Output Summary (Last 24 hours) at 11/23/2022 1128 Last data filed at 11/23/2022 1057 Gross per 24 hour  Intake 962.68 ml  Output 1525 ml  Net -562.32 ml   Filed Weights   11/21/22 1450 11/21/22 2238  Weight: 85.7 kg 86.8 kg    Examination: General: Chronically ill-appearing, has some pain so does appear uncomfortable HENT: Moist oral mucosa Lungs: Decreased air entry, chest tube in place on the right Cardiovascular:  S1-S2 appreciated with no murmur Abdomen: Bowel sounds appreciated Extremities: No clubbing, no edema Neuro: Alert and oriented x 3 GU:   Labs reviewed  Resolved Hospital Problem list     Assessment & Plan:  Patient with tension pneumothorax s/p chest tube placement No airleak currently There is complete reexpansion of the lung however there is some atelectasis at the base which may be related to hypoventilation from patient having significant pain and discomfort  Acute hypoxemic respiratory failure secondary to pneumonia -Continue broad-spectrum antibiotics -Continue follow cultures  Chest tube was placed on waterseal today  Will repeat a chest x-ray in about 2 hours  May consider clamping chest tube and discontinuation on 11/24/2022

## 2022-11-23 NOTE — Inpatient Diabetes Management (Signed)
Inpatient Diabetes Program Recommendations  AACE/ADA: New Consensus Statement on Inpatient Glycemic Control (2015)  Target Ranges:  Prepandial:   less than 140 mg/dL      Peak postprandial:   less than 180 mg/dL (1-2 hours)      Critically ill patients:  140 - 180 mg/dL   Lab Results  Component Value Date   GLUCAP 202 (H) 11/23/2022   HGBA1C 9.0 (H) 11/22/2022    Review of Glycemic Control  Latest Reference Range & Units 11/22/22 11:00 11/22/22 16:40 11/22/22 21:03 11/23/22 06:11  Glucose-Capillary 70 - 99 mg/dL 161 (H) 096 (H) 045 (H) 202 (H)  (H): Data is abnormally high Diabetes history: Type 2 DM Outpatient Diabetes medications: Glipizide 5 mg QD Current orders for Inpatient glycemic control: Novolog 0-15 units TID & HS, Semglee 8 units QD   Inpatient Diabetes Program Recommendations:  Spoke with patient regarding outpatient diabetes management. Patient has been on insulin pens outpatient in the past but has recently discontinued due to "just stopping." Is willing to restart at home if necessary.  Reviewed patient's current A1c of 9.0%. Explained what a A1c is and what it measures. Also reviewed goal A1c with patient, importance of good glucose control @ home, and blood sugar goals. Reviewed patho of DM, need for improved control, role of pancreas, impact of insulin, survival skills, vascular changes, increased risk for infection and other commorbidities.  Patient has a meter and testing supplies at home, however, reports that he hasn't been using them. Reviewed frequency with patient and need for PCP follow up. However, patient dozing some during conversation due to clinical status and will need further reinforcement once he begins to improve.   Follow.   Thanks, [name], MSN, RNC-OB Diabetes Coordinator [phone] (8a-5p)

## 2022-11-23 NOTE — Progress Notes (Signed)
   11/23/22 1942  Assess: MEWS Score  Temp 98.5 F (36.9 C)  BP 127/77  MAP (mmHg) 92  Pulse Rate (!) 117  ECG Heart Rate (!) 117  Resp 18  Level of Consciousness Alert  SpO2 90 %  O2 Device Nasal Cannula  O2 Flow Rate (L/min) 2 L/min  Assess: MEWS Score  MEWS Temp 0  MEWS Systolic 0  MEWS Pulse 2  MEWS RR 0  MEWS LOC 0  MEWS Score 2  MEWS Score Color Yellow  Assess: if the MEWS score is Yellow or Red  Were vital signs taken at a resting state? Yes  Does the patient meet 2 or more of the SIRS criteria? Yes  Does the patient have a confirmed or suspected source of infection? No  MEWS guidelines implemented  Yes, yellow  Treat  MEWS Interventions Considered administering scheduled or prn medications/treatments as ordered  Take Vital Signs  Increase Vital Sign Frequency  Yellow: Q2hr x1, continue Q4hrs until patient remains green for 12hrs  Escalate  MEWS: Escalate Yellow: Discuss with charge nurse and consider notifying provider and/or RRT  Notify: Charge Nurse/RN  Name of Charge Nurse/RN Notified April  Provider Notification  Provider Name/Title Dr. Joneen Roach  Date Provider Notified 11/23/22  Time Provider Notified 2019  Method of Notification Page  Notification Reason Change in status (elevated HR)  Assess: SIRS CRITERIA  SIRS Temperature  0  SIRS Pulse 1  SIRS Respirations  0  SIRS WBC 0  SIRS Score Sum  1

## 2022-11-24 ENCOUNTER — Inpatient Hospital Stay (HOSPITAL_COMMUNITY): Payer: Federal, State, Local not specified - PPO

## 2022-11-24 DIAGNOSIS — J90 Pleural effusion, not elsewhere classified: Secondary | ICD-10-CM | POA: Diagnosis not present

## 2022-11-24 DIAGNOSIS — J189 Pneumonia, unspecified organism: Secondary | ICD-10-CM | POA: Diagnosis not present

## 2022-11-24 DIAGNOSIS — J9312 Secondary spontaneous pneumothorax: Secondary | ICD-10-CM | POA: Diagnosis not present

## 2022-11-24 LAB — BASIC METABOLIC PANEL
Anion gap: 9 (ref 5–15)
BUN: 12 mg/dL (ref 6–20)
CO2: 25 mmol/L (ref 22–32)
Calcium: 7.8 mg/dL — ABNORMAL LOW (ref 8.9–10.3)
Chloride: 92 mmol/L — ABNORMAL LOW (ref 98–111)
Creatinine, Ser: 1.02 mg/dL (ref 0.61–1.24)
GFR, Estimated: >60 mL/min (ref >60)
Glucose, Bld: 314 mg/dL — ABNORMAL HIGH (ref 70–99)
Potassium: 4.2 mmol/L (ref 3.5–5.1)
Sodium: 126 mmol/L — ABNORMAL LOW (ref 135–145)

## 2022-11-24 LAB — CBC
HCT: 38 % — ABNORMAL LOW (ref 39.0–52.0)
Hemoglobin: 12.6 g/dL — ABNORMAL LOW (ref 13.0–17.0)
MCH: 29.7 pg (ref 26.0–34.0)
MCHC: 33.2 g/dL (ref 30.0–36.0)
MCV: 89.6 fL (ref 80.0–100.0)
Platelets: 299 10*3/uL (ref 150–400)
RBC: 4.24 MIL/uL (ref 4.22–5.81)
RDW: 13.9 % (ref 11.5–15.5)
WBC: 18.5 10*3/uL — ABNORMAL HIGH (ref 4.0–10.5)
nRBC: 0 % (ref 0.0–0.2)

## 2022-11-24 LAB — GLUCOSE, CAPILLARY
Glucose-Capillary: 232 mg/dL — ABNORMAL HIGH (ref 70–99)
Glucose-Capillary: 235 mg/dL — ABNORMAL HIGH (ref 70–99)
Glucose-Capillary: 236 mg/dL — ABNORMAL HIGH (ref 70–99)
Glucose-Capillary: 238 mg/dL — ABNORMAL HIGH (ref 70–99)

## 2022-11-24 LAB — MAGNESIUM: Magnesium: 2 mg/dL (ref 1.7–2.4)

## 2022-11-24 LAB — CULTURE, BLOOD (ROUTINE X 2): Culture: NO GROWTH

## 2022-11-24 LAB — BODY FLUID CULTURE W GRAM STAIN

## 2022-11-24 MED ORDER — INSULIN GLARGINE-YFGN 100 UNIT/ML ~~LOC~~ SOLN: 12.0000 [IU] | Freq: Every day | SUBCUTANEOUS | Status: DC

## 2022-11-24 MED ORDER — INSULIN GLARGINE-YFGN 100 UNIT/ML ~~LOC~~ SOLN
15.0000 [IU] | Freq: Every day | SUBCUTANEOUS | Status: DC
  Administered 2022-11-25 – 2022-11-28 (×4): 15 [IU] via SUBCUTANEOUS
  Filled 2022-11-24 (×5): qty 0.15

## 2022-11-24 MED ORDER — KETOROLAC TROMETHAMINE 30 MG/ML IJ SOLN
30.0000 mg | Freq: Four times a day (QID) | INTRAMUSCULAR | Status: DC
  Administered 2022-11-24 – 2022-11-25 (×5): 30 mg via INTRAVENOUS
  Filled 2022-11-24 (×5): qty 1

## 2022-11-24 MED ORDER — POLYETHYLENE GLYCOL 3350 17 G PO PACK: 17.0000 g | PACK | Freq: Every day | ORAL | Status: DC | PRN

## 2022-11-24 NOTE — Progress Notes (Signed)
On assessment of patient, chest tube/pigtail found out and on floor.  Pt in no acute distress.  Pt unaware tube was out.  Suture noticed around tube.  Site assessed.  Small amount of blood at site noticed.  Vaseline gauze and dry dressing placed.  VSS, HR 97, 116/67, oxygen sat 94, O2 3L [place].  [facility] called and made aware.  Portable chest xray ordered.

## 2022-11-24 NOTE — Inpatient Diabetes Management (Signed)
Inpatient Diabetes Program Recommendations  AACE/ADA: New Consensus Statement on Inpatient Glycemic Control (2015)  Target Ranges:  Prepandial:   less than 140 mg/dL      Peak postprandial:   less than 180 mg/dL (1-2 hours)      Critically ill patients:  140 - 180 mg/dL   Lab Results  Component Value Date   GLUCAP 232 (H) 11/24/2022   HGBA1C 9.0 (H) 11/22/2022    Review of Glycemic Control  Latest Reference Range & Units 11/23/22 11:36 11/23/22 15:05 11/23/22 21:31 11/24/22 06:26  Glucose-Capillary 70 - 99 mg/dL 161 (H) 096 (H) 045 (H) 232 (H)  (H): Data is abnormally high Diabetes history: Type 2 DM Outpatient Diabetes medications: Glipizide 5 mg QD Current orders for Inpatient glycemic control: Novolog 0-15 units TID & HS, Semglee 8 units QD   Inpatient Diabetes Program Recommendations:   Consider further increasing Semglee to 15 units QD and adding Novolog 3 units TID (Assuming patient consuming >50% of meals)  Thanks, [name], MSN, RNC-OB Diabetes Coordinator [phone] (8a-5p)

## 2022-11-24 NOTE — Plan of Care (Signed)

## 2022-11-24 NOTE — Progress Notes (Signed)
Pharmacy Antibiotic Note  Louis [name] is a 57 y.o. male admitted on 11/21/2022 with pneumonia.  Pharmacy has been consulted for vancomycin dosing. Patient had URI x 1 week prior to admission. Symptoms worsened and patient presented to the ED requiring chest tube placement. Noted to be COVID positive. PMH significant for MRSA bacteremia with epidural abscess in 2023.  WBC  improved but remains elevated at 18.5k.  Scr 1.18 >> 0.87> 1.02,  CrCl 83.1 ml/min, afebrile,  HR 90.  5/28 Pleural fluid cx:  + MRSA.  5/28 and 11/22/22 Blood cultures no growth to date.   Ceftriaxone and azithromycin were discontinued on 11/23/22 per MD  CT fell out overnight 5/31. On 2L [place].  Plan: Continue Vancomycin 1000 mg every 12 hours for eAUC of 433 and Csmin of 12, (used Scr 1)  Monitor cultures to deescalate as indicated.  Will check steady vancomycin level per protocol if Vanc vanco levels if continued >5 days or if renal fxn changes/worsens.     Height: 5\' 11"  (180.3 cm) Weight: 86.8 kg (191 lb 5.8 oz) IBW/kg (Calculated) : 75.3  Temp (24hrs), Avg:98.7 F (37.1 C), Min:98.1 F (36.7 C), Max:99 F (37.2 C)  Recent Labs  Lab 11/21/22 1500 11/21/22 1516 11/21/22 1915 11/22/22 0055 11/22/22 0306 11/22/22 1328 11/23/22 0045 11/24/22 0014  WBC 22.6*  --   --  18.8*  --   --  15.4* 18.5*  CREATININE 1.18 1.00  --   --  1.07 0.83 0.87 1.02  LATICACIDVEN  --   --  1.8  --  1.2  --   --   --      Estimated Creatinine Clearance: 83.1 mL/min (by C-G formula based on SCr of 1.02 mg/dL).    No Known Allergies  Antimicrobials this admission: Vancomycin 5/28 >>   Ceftriaxone 5/28 >> 5/30 Azithromycin 5/28 >> 5/30  Microbiology results: 5/29 BCx: ngtd x 2d 5/28 Pleural fluid cx:  MRSA 5/28 BCx: ngtd x3 d 5/28 COVID: positive 5/28 MRSA PCR: positive    Thank you for involving pharmacy in this patient's care.  Noah Delaine, RPh Clinical Pharmacist 11/24/2022 2:33 PM

## 2022-11-24 NOTE — Progress Notes (Signed)
[facility] Progress Note Patient: Louis [name] ZOX:[id number] DOB: 11-Jun-1966 DOA: 11/21/2022  DOS: the patient was seen and examined on 11/24/2022  Brief hospital course: HTN, DM2, SIADH, MRSA bacteremia and osteomyelitis not on chronic ABx.   May 1 week COVID-positive.  Did not receive any therapy. May last week recurrence of shortness of breath and cough.  Treated outpatient with antibiotics and prednisone.  Found to have large right pneumothorax as well as Staph aureus pneumonia. Assessment and Plan: Staph aureus pneumonia. Presents with symptoms of upper respiratory infection ongoing for last 1 week treated with outpatient antibiotics and steroids without any improvement. Found to have tension pneumothorax at the time of admission. Pleural fluid culture growing Staph aureus positive for MRSA.  Sensitive to doxycycline. Will continue with IV vancomycin for now.  If the leukocytosis worsens may require a CT scan. Currently 14-day antibiotic course recommended. If leukocytosis improves will transition to oral doxycycline.  Tension pneumothorax. SP chest tube placement. Chest tube got dislodged on 5/30 night.  Chest x-ray after that showed no pneumothorax. Management per PCCM.  Will add cough suppression medication.  Acute hypoxic respiratory failure. Secondary to above. Monitor for improvement in oxygenation after resolution of the pneumothorax.  Recent COVID infection. Patient's COVID test was positive this admission.  CT value 37. Presentation is mostly secondary to Staph aureus bacteria pneumonia. Had a COVID infection early May. At present does not require any isolation.  Hyponatremia/SIADH. Sodium level stable. Monitor.  Type 2 diabetes mellitus, uncontrolled with hyperglycemia without long-term insulin use and complication. Currently on sliding scale insulin.  HTN. Blood pressure stable. Currently holding home regimen.  Left-sided chest pain. Adding Toradol.   Continue current cough regimen.  Subjective: Reports severe pain.  No nausea no vomiting no fever no chills.  Continues to have cough.  Physical Exam: In moderate distress. Bilateral basal crackles. No edema. No wheezing. S1-S2 present.  Data Reviewed: I have Reviewed nursing notes, Vitals, and Lab results. Reviewed CBC and BMP.  Reordered CBC and BMP.  Discussed with PCCM.  Disposition: Status is: Inpatient Remains inpatient appropriate because: Need for IV antibiotics  enoxaparin (LOVENOX) injection 40 mg Start: 11/22/22 1000   Family Communication: No one at bedside Level of care: Progressive  Vitals:   11/24/22 0319 11/24/22 0814 11/24/22 1124 11/24/22 1556  BP: 130/80 110/78 123/73 107/80  Pulse: 99 94 90 93  Resp: 17 18 15  (!) 23  Temp: 99 F (37.2 C) 99 F (37.2 C) 99 F (37.2 C) 99 F (37.2 C)  TempSrc: Axillary Oral  Oral  SpO2: 92% 91% 93% 94%  Weight:      Height:         Author: Lynden Oxford, MD 11/24/2022 5:53 PM  Please look on [email or web address] to find out who is on call.

## 2022-11-24 NOTE — Progress Notes (Signed)
[facility]Link Physician-Brief Progress Note Patient Name: Louis [name] DOB: 07/31/1965 MRN: [id number]   Date of Service  11/24/2022  HPI/Events of Note  Asked to review chest x-ray of this patient who had had a pneumothorax.  Chest tube inadvertently fell out.  eICU Interventions  X-ray reviewed.  No pneumothorax.  Unchanged except for chest tube being removed.  No intervention planned.     Intervention Category Evaluation Type: Other  Donnavan Covault 11/24/2022, 2:05 AM

## 2022-11-24 NOTE — Progress Notes (Signed)
Pharmacy Antibiotic Note  Louis [name] is a 57 y.o. male admitted on 11/21/2022 with pneumonia.  Pharmacy has been consulted for vancomycin dosing. Patient had URI x 1 week prior to admission. Symptoms worsened and patient presented to the ED requiring chest tube placement. Noted to be COVID positive. PMH significant for MRSA bacteremia with epidural abscess in 2023.  WBC  improved but remains elevated at 18.5k.  Scr 1.18 >> 0.87> 1.02,  CrCl 83.1 ml/min, afebrile,  HR 90.  5/28 Pleural fluid cx:  + MRSA.  5/28 and 11/22/22 Blood cultures no growth to date.   Ceftriaxone and azithromycin were discontinued on 11/23/22 per MD  CT fell out overnight 5/31. On 2L [place].  Plan: Continue Vancomycin 1000 mg every 12 hours for eAUC of 433 and Csmin of 12, (used Scr 1)  Monitor cultures to deescalate as indicated.  Will check steady state vancomycin levels    Height: 5\' 11"  (180.3 cm) Weight: 86.8 kg (191 lb 5.8 oz) IBW/kg (Calculated) : 75.3  Temp (24hrs), Avg:98.7 F (37.1 C), Min:98.1 F (36.7 C), Max:99 F (37.2 C)  Recent Labs  Lab 11/21/22 1500 11/21/22 1516 11/21/22 1915 11/22/22 0055 11/22/22 0306 11/22/22 1328 11/23/22 0045 11/24/22 0014  WBC 22.6*  --   --  18.8*  --   --  15.4* 18.5*  CREATININE 1.18 1.00  --   --  1.07 0.83 0.87 1.02  LATICACIDVEN  --   --  1.8  --  1.2  --   --   --      Estimated Creatinine Clearance: 83.1 mL/min (by C-G formula based on SCr of 1.02 mg/dL).    No Known Allergies  Antimicrobials this admission: Vancomycin 5/28 >>   Ceftriaxone 5/28 >> 5/30 Azithromycin 5/28 >> 5/30  Microbiology results: 5/29 BCx: ngtd x 2d 5/28 Pleural fluid cx:  MRSA 5/28 BCx: ngtd x3 d 5/28 COVID: positive 5/28 MRSA PCR: positive    Thank you for involving pharmacy in this patient's care.  Noah Delaine, RPh Clinical Pharmacist 11/24/2022 2:49 PM

## 2022-11-24 NOTE — Progress Notes (Signed)
   NAME:  Louis [name], MRN:  [id number], DOB:  1965/09/10, LOS: 3 ADMISSION DATE:  11/21/2022, CONSULTATION DATE: 11/21/2022 REFERRING MD: Tanda Rockers, CHIEF COMPLAINT: Chest tube management  History of Present Illness:  57 year old male former smoker with who presents with acute hypoxemic respiratory failure with SpO2 in low 80s requiring NRB and hypotension with SBP 89/61.   He reports two week history with covid infection from recent sick contacts. Received steroids. However symptoms recurred and worsened in the last 2-3 days with shortness of breath, chest pain, productive cough and fevers. CXR with large right pneumothorax. ED placed right chest tube for tension pneumothorax with near resolution and pigtail in place. Oxygen weaned to NRB to 3L O2. PCCM consulted for chest tube management. Labs significant for +Covid, WBC 22.6  Pertinent  Medical History  CAD, DM2, HTN, HLD, chronic diastolic heart failure, hx epidural abscess/MRSA bacteremia, hx vertebral OM   Significant Hospital Events: Including procedures, antibiotic start and stop dates in addition to Louis pertinent events   5/28 chest x-ray with full reexpansion of the lung 5/30-Chest x-ray with reexpansion of the lung but does have some consolidation at the base.  Pleural fluid shows abundant Staphylococcus aureus. 5/31 chest tube fell out overnight.  Interim History / Subjective:  Continues to have significant pleuritic chest pain.  Objective   Blood pressure 110/78, pulse 94, temperature 99 F (37.2 C), temperature source Oral, resp. rate 18, height 5\' 11"  (1.803 m), weight 86.8 kg, SpO2 91 %.        Intake/Output Summary (Last 24 hours) at 11/24/2022 1025 Last data filed at 11/23/2022 2321 Gross per 24 hour  Intake 1008.39 ml  Output 310 ml  Net 698.39 ml    Filed Weights   11/21/22 1450 11/21/22 2238  Weight: 85.7 kg 86.8 kg    Examination: General: Appears flushed.  Toxic appearance. HENT: Dry oral mucosa Lungs:  Chest tube site intact.  Decreased air entry with coarse crackles at the right base. Cardiovascular: S1-S2 appreciated with no murmur Abdomen: Bowel sounds appreciated Extremities: No clubbing, no edema Neuro: Alert and oriented x 3 GU: Deferred.  Point-of-care ultrasound shows no residual pleural effusion.  There is consolidation at the right base.    Ancillary tests personally reviewed:  Mild hyponatremia 126 Still moderately hyperglycemic Worsening leukocytosis up to 18.5.  Assessment & Plan:   Patient with tension pneumothorax s/p chest tube placement.  No recurrence following chest tube removal Acute hypoxemic respiratory failure secondary to Staphylococcus aureus pneumonia Staphylococcus pneumonia superimposed on COVID-19 pneumonia. Pneumonia related pleurisy with empyema with complete drainage from prior chest tube Hypovolemic hyponatremia  Plan:  -No pneumothorax recurrence to require new chest tube. -No residual pleural fluid that requires further drainage. -Continue vancomycin for now pending sensitivities.  -Tentatively plan for 14-day course of antibiotics.  However if white count continues to increase would obtain CT scan of chest to rule out intraparenchymal abscess which would affect duration of therapy. -Needs improved pain control.  Suggest multimodal pain control including round-the-clock acetaminophen, lidocaine patch.  Given normal renal function, NSAID may be particularly effective.  Limit IV narcotics given short duration of action. -Incentive spirometry.  Lynnell Catalan, MD [facility] ICU Physician [facility]  Pager: [phone] Or Epic Secure Chat After hours: [phone].  11/24/2022, 10:40 AM

## 2022-11-25 ENCOUNTER — Inpatient Hospital Stay (HOSPITAL_COMMUNITY): Payer: Federal, State, Local not specified - PPO

## 2022-11-25 DIAGNOSIS — Z20822 Contact with and (suspected) exposure to covid-19: Secondary | ICD-10-CM | POA: Diagnosis not present

## 2022-11-25 DIAGNOSIS — J90 Pleural effusion, not elsewhere classified: Secondary | ICD-10-CM | POA: Diagnosis not present

## 2022-11-25 DIAGNOSIS — I700 Atherosclerosis of aorta: Secondary | ICD-10-CM | POA: Diagnosis not present

## 2022-11-25 DIAGNOSIS — Z8616 Personal history of COVID-19: Secondary | ICD-10-CM | POA: Diagnosis not present

## 2022-11-25 DIAGNOSIS — J189 Pneumonia, unspecified organism: Secondary | ICD-10-CM | POA: Diagnosis not present

## 2022-11-25 DIAGNOSIS — J15212 Pneumonia due to Methicillin resistant Staphylococcus aureus: Secondary | ICD-10-CM | POA: Diagnosis not present

## 2022-11-25 DIAGNOSIS — G928 Other toxic encephalopathy: Secondary | ICD-10-CM | POA: Diagnosis not present

## 2022-11-25 DIAGNOSIS — J9312 Secondary spontaneous pneumothorax: Secondary | ICD-10-CM | POA: Diagnosis not present

## 2022-11-25 LAB — CBC
HCT: 36.2 % — ABNORMAL LOW (ref 39.0–52.0)
Hemoglobin: 12.1 g/dL — ABNORMAL LOW (ref 13.0–17.0)
MCH: 31.2 pg (ref 26.0–34.0)
MCHC: 33.4 g/dL (ref 30.0–36.0)
MCV: 93.3 fL (ref 80.0–100.0)
Platelets: 314 10*3/uL (ref 150–400)
RBC: 3.88 MIL/uL — ABNORMAL LOW (ref 4.22–5.81)
RDW: 14.1 % (ref 11.5–15.5)
WBC: 16.6 10*3/uL — ABNORMAL HIGH (ref 4.0–10.5)
nRBC: 0 % (ref 0.0–0.2)

## 2022-11-25 LAB — BASIC METABOLIC PANEL
Anion gap: 10 (ref 5–15)
BUN: 14 mg/dL (ref 6–20)
CO2: 28 mmol/L (ref 22–32)
Calcium: 8.2 mg/dL — ABNORMAL LOW (ref 8.9–10.3)
Chloride: 94 mmol/L — ABNORMAL LOW (ref 98–111)
Creatinine, Ser: 0.99 mg/dL (ref 0.61–1.24)
GFR, Estimated: >60 mL/min (ref >60)
Glucose, Bld: 274 mg/dL — ABNORMAL HIGH (ref 70–99)
Potassium: 4.2 mmol/L (ref 3.5–5.1)
Sodium: 132 mmol/L — ABNORMAL LOW (ref 135–145)

## 2022-11-25 LAB — VANCOMYCIN, TROUGH: Vancomycin Tr: 10 ug/mL — ABNORMAL LOW (ref 15–20)

## 2022-11-25 LAB — GLUCOSE, CAPILLARY
Glucose-Capillary: 300 mg/dL — ABNORMAL HIGH (ref 70–99)
Glucose-Capillary: 265 mg/dL — ABNORMAL HIGH (ref 70–99)
Glucose-Capillary: 184 mg/dL — ABNORMAL HIGH (ref 70–99)
Glucose-Capillary: 208 mg/dL — ABNORMAL HIGH (ref 70–99)

## 2022-11-25 LAB — VANCOMYCIN, PEAK: Vancomycin Pk: 23 ug/mL — ABNORMAL LOW (ref 30–40)

## 2022-11-25 LAB — MAGNESIUM: Magnesium: 2.1 mg/dL (ref 1.7–2.4)

## 2022-11-25 MED ORDER — IOHEXOL 350 MG/ML SOLN
75.0000 mL | Freq: Once | INTRAVENOUS | Status: AC | PRN
  Administered 2022-11-25: 75 mL via INTRAVENOUS

## 2022-11-25 MED ORDER — VANCOMYCIN HCL 1250 MG/250ML IV SOLN
1250.0000 mg | Freq: Two times a day (BID) | INTRAVENOUS | Status: DC
  Filled 2022-11-25: qty 250

## 2022-11-25 MED ORDER — LINEZOLID 600 MG PO TABS
600.0000 mg | ORAL_TABLET | Freq: Two times a day (BID) | ORAL | Status: DC
  Administered 2022-11-25 – 2022-11-26 (×3): 600 mg via ORAL
  Filled 2022-11-25 (×4): qty 1

## 2022-11-25 MED ORDER — NAPROXEN 250 MG PO TABS
500.0000 mg | ORAL_TABLET | Freq: Two times a day (BID) | ORAL | Status: DC
  Administered 2022-11-25 – 2022-11-26 (×2): 500 mg via ORAL
  Filled 2022-11-25 (×2): qty 2

## 2022-11-25 MED ORDER — LIDOCAINE 5 % EX PTCH
1.0000 | MEDICATED_PATCH | CUTANEOUS | Status: DC
  Administered 2022-11-25 – 2022-11-28 (×4): 1 via TRANSDERMAL
  Filled 2022-11-25 (×6): qty 1

## 2022-11-25 MED ORDER — OXYCODONE HCL 5 MG PO TABS
10.0000 mg | ORAL_TABLET | ORAL | Status: DC | PRN
  Administered 2022-11-25 – 2022-11-26 (×2): 10 mg via ORAL
  Filled 2022-11-25 (×2): qty 2

## 2022-11-25 MED ORDER — ACETAMINOPHEN 325 MG PO TABS
650.0000 mg | ORAL_TABLET | Freq: Four times a day (QID) | ORAL | Status: DC
  Administered 2022-11-25 – 2022-12-01 (×22): 650 mg via ORAL
  Filled 2022-11-25 (×22): qty 2

## 2022-11-25 NOTE — Plan of Care (Signed)
  Problem: Education: Goal: Ability to describe self-care measures that may prevent or decrease complications (Diabetes Survival Skills Education) will improve Outcome: Progressing Goal: Individualized Educational Video(s) Outcome: Progressing   Problem: Coping: Goal: Ability to adjust to condition or change in health will improve Outcome: Progressing   Problem: Fluid Volume: Goal: Ability to maintain a balanced intake and output will improve Outcome: Progressing   Problem: Health Behavior/Discharge Planning: Goal: Ability to identify and utilize available resources and services will improve Outcome: Progressing Goal: Ability to manage health-related needs will improve Outcome: Progressing   Problem: Metabolic: Goal: Ability to maintain appropriate glucose levels will improve Outcome: Progressing   Problem: Nutritional: Goal: Maintenance of adequate nutrition will improve Outcome: Progressing Goal: Progress toward achieving an optimal weight will improve Outcome: Progressing   Problem: Skin Integrity: Goal: Risk for impaired skin integrity will decrease Outcome: Progressing   Problem: Health Behavior/Discharge Planning: Goal: Ability to identify and utilize available resources and services will improve Outcome: Progressing Goal: Ability to manage health-related needs will improve Outcome: Progressing   Problem: Metabolic: Goal: Ability to maintain appropriate glucose levels will improve Outcome: Progressing   Problem: Nutritional: Goal: Maintenance of adequate nutrition will improve Outcome: Progressing Goal: Progress toward achieving an optimal weight will improve Outcome: Progressing   Problem: Skin Integrity: Goal: Risk for impaired skin integrity will decrease Outcome: Progressing   Problem: Tissue Perfusion: Goal: Adequacy of tissue perfusion will improve Outcome: Progressing   Problem: Education: Goal: Knowledge of General Education information will  improve Description: Including pain rating scale, medication(s)/side effects and non-pharmacologic comfort measures Outcome: Progressing   Problem: Health Behavior/Discharge Planning: Goal: Ability to manage health-related needs will improve Outcome: Progressing   Problem: Clinical Measurements: Goal: Ability to maintain clinical measurements within normal limits will improve Outcome: Progressing Goal: Will remain free from infection Outcome: Progressing Goal: Diagnostic test results will improve Outcome: Progressing Goal: Respiratory complications will improve Outcome: Progressing Goal: Cardiovascular complication will be avoided Outcome: Progressing   Problem: Activity: Goal: Risk for activity intolerance will decrease Outcome: Progressing   Problem: Nutrition: Goal: Adequate nutrition will be maintained Outcome: Progressing   Problem: Coping: Goal: Level of anxiety will decrease Outcome: Progressing   Problem: Elimination: Goal: Will not experience complications related to bowel motility Outcome: Progressing Goal: Will not experience complications related to urinary retention Outcome: Progressing

## 2022-11-25 NOTE — Plan of Care (Signed)
  Problem: Coping: Goal: Ability to adjust to condition or change in health will improve Outcome: Progressing   Problem: Fluid Volume: Goal: Ability to maintain a balanced intake and output will improve Outcome: Progressing   Problem: Metabolic: Goal: Ability to maintain appropriate glucose levels will improve Outcome: Progressing   Problem: Nutritional: Goal: Maintenance of adequate nutrition will improve Outcome: Progressing   Problem: Tissue Perfusion: Goal: Adequacy of tissue perfusion will improve Outcome: Progressing   

## 2022-11-25 NOTE — Progress Notes (Signed)
NAME:  Louis [name], MRN:  [id number], DOB:  Jun 02, 1966, LOS: 4 ADMISSION DATE:  11/21/2022, CONSULTATION DATE: 11/21/2022 REFERRING MD: Tanda Rockers, CHIEF COMPLAINT: Chest tube management  History of Present Illness:  57 year old male former smoker with who presents with acute hypoxemic respiratory failure with SpO2 in low 80s requiring NRB and hypotension with SBP 89/61.   He reports two week history with covid infection from recent sick contacts. Received steroids. However symptoms recurred and worsened in the last 2-3 days with shortness of breath, chest pain, productive cough and fevers. CXR with large right pneumothorax. ED placed right chest tube for tension pneumothorax with near resolution and pigtail in place. Oxygen weaned to NRB to 3L O2. PCCM consulted for chest tube management. Labs significant for +Covid, WBC 22.6  Pertinent  Medical History  CAD, DM2, HTN, HLD, chronic diastolic heart failure, hx epidural abscess/MRSA bacteremia, hx vertebral OM   Significant Hospital Events: Including procedures, antibiotic start and stop dates in addition to other pertinent events   5/28 chest x-ray with full reexpansion of the lung 5/30-Chest x-ray with reexpansion of the lung but does have some consolidation at the base.  Pleural fluid shows abundant Staphylococcus aureus. 5/31 chest tube fell out overnight.  Interim History / Subjective:  Was apparently doing poorly earlier today, but when I saw him, he was resting comfortably. Says he's ready to go home.   Objective   Blood pressure 110/68, pulse 84, temperature 97.8 F (36.6 C), temperature source Oral, resp. rate 15, height 5\' 11"  (1.803 m), weight 86.8 kg, SpO2 94 %.        Intake/Output Summary (Last 24 hours) at 11/25/2022 1428 Last data filed at 11/25/2022 0649 Gross per 24 hour  Intake 570 ml  Output 1800 ml  Net -1230 ml    Filed Weights   11/21/22 1450 11/21/22 2238  Weight: 85.7 kg 86.8 kg    Examination: General:  Appears flushed, but more comfortable.  HENT: Dry oral mucosa Lungs: Chest tube site intact.  Decreased air entry with coarse crackles at the right base. Cardiovascular: S1-S2 appreciated with no murmur Abdomen: Bowel sounds appreciated Extremities: No clubbing, no edema Neuro: Alert and oriented x 3 GU: Deferred.  Point-of-care ultrasound shows no residual pleural effusion.  There is consolidation at the right base.    Ancillary tests personally reviewed:  Mild hyponatremia 126 Still moderately hyperglycemic Worsening leukocytosis up to 18.5. CT shows right LL consolidation and no significant effusion.   Assessment & Plan:   Patient with tension pneumothorax s/p chest tube placement.  No recurrence following chest tube removal Acute hypoxemic respiratory failure secondary to Staphylococcus aureus pneumonia Staphylococcus pneumonia superimposed on COVID-19 pneumonia. Pneumonia related pleurisy with empyema with complete drainage from prior chest tube Hypovolemic hyponatremia  Plan:  -No pneumothorax recurrence to require new chest tube. -No residual pleural fluid that requires further drainage. -Continue vancomycin for now pending sensitivities.  -Tentatively plan for 14-day course of antibiotics.   Will switch to linezolid po in preparation for discharge.  -Needs improved pain control.  On multimodal pain control including round-the-clock acetaminophen, lidocaine patch.  Given normal renal function, started naproxen  Limit IV narcotics given short duration of action. -Incentive spirometry. - If feeling well, could go home with O2 temporarily.  - Will need repeat imaging to ensure clearance after 1 month.   Lynnell Catalan, MD [facility] ICU Physician [facility]  Pager: [phone] Or Epic Secure Chat After hours: [phone].  11/25/2022,  2:28 PM

## 2022-11-25 NOTE — Progress Notes (Signed)
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   May 1 week COVID-positive.  Did not receive any therapy. May last week recurrence of shortness of breath and cough.  Treated outpatient with antibiotics and prednisone.  Found to have large right pneumothorax as well as Staph aureus pneumonia. Assessment and Plan: Staph aureus pneumonia. Presents with symptoms of upper respiratory infection ongoing for last 1 week treated with outpatient antibiotics and steroids without any improvement. Found to have tension pneumothorax at the time of admission. Pleural fluid culture growing Staph aureus positive for MRSA.  Sensitive to doxycycline. Will continue with IV vancomycin for now.  CT chest performed.  Shows evidence of loculated pleural effusion.  PCCM will do patient on following.  Recommending continuing current antibiotic and pain control. Currently 14-day antibiotic course recommended. If leukocytosis improves will transition to oral doxycycline.  Tension pneumothorax. SP chest tube placement. Chest tube got dislodged on 5/30 night.  Chest x-ray after that showed no pneumothorax. Management per PCCM.  Will add cough suppression medication.  Acute hypoxic respiratory failure. Secondary to above. Monitor for improvement in oxygenation after resolution of the pneumothorax.  Recent COVID infection. Patient's COVID test was positive this admission.  CT value 37. Presentation is mostly secondary to Staph aureus bacteria pneumonia. Had a COVID infection early May. At present does not require any isolation.  Hyponatremia/SIADH. Sodium level stable. Monitor.  Type 2 diabetes mellitus, uncontrolled with hyperglycemia without long-term insulin use and complication. Currently on sliding scale  insulin.  HTN. Blood pressure stable. Currently holding home regimen.  Left-sided chest pain. Medical adjusted to PCCM so far.  Subjective: Continues to chest pain.  No nausea vomiting fever no chills.  Physical Exam: In moderate distress.  Bilateral basal crackles.  No edema. S1-S2 present.  Data Reviewed: I have Reviewed nursing notes, Vitals, and Lab results. Reviewed CBC and BMP.  Ordered CT chest.  Discussed with PCCM.  Disposition: Status is: Inpatient Remains inpatient appropriate because: Need for IV antibiotics enoxaparin (LOVENOX) injection 40 mg Start: 11/22/22 1000   Family Communication: No one at bedside Level of care: Progressive  Vitals:   11/25/22 0325 11/25/22 0849 11/25/22 1100 11/25/22 1617  BP: 106/67  110/68 115/69  Pulse: 84 87 84 87  Resp: 14 18 15 18   Temp: 98.4 F (36.9 C) 98.2 F (36.8 C) 97.8 F (36.6 C) 98.3 F (36.8 C)  TempSrc: Oral Oral Oral Oral  SpO2: 95% 95% 94% (!) 72%  Weight:      Height:         Author: Lynden Oxford, MD 11/25/2022 6:09 PM  Please look on [email or web address] to find out who is on call.

## 2022-11-25 NOTE — Progress Notes (Signed)
Pharmacy Antibiotic Note  Louis [name] is a 57 y.o. male admitted on 11/21/2022 with pneumonia.  Pharmacy has been consulted for vancomycin dosing. Patient had URI x 1 week prior to admission. Symptoms worsened and patient presented to the ED requiring chest tube placement. Noted to be COVID positive. PMH significant for MRSA bacteremia with epidural abscess in 2023. Body fluid culture 11/21/22 with MRSA. COVID positive 11/21/22.  Vancomycin peak of 23 and trough of 10 drawn 11/25/22 leads to calculated AUC of 406. WBC remains elevated at 16.6. Creatinine is stable.  Plan: Increase Vancomycin 1250 mg every 12 hours for [facility] of 507 and Csmin of 12 based on kinetics Monitor cultures to deescalate as indicated.  Will check steady state vancomycin levels   Height: 5\' 11"  (180.3 cm) Weight: 86.8 kg (191 lb 5.8 oz) IBW/kg (Calculated) : 75.3  Temp (24hrs), Avg:98.3 F (36.8 C), Min:97.6 F (36.4 C), Max:99 F (37.2 C)  Recent Labs  Lab 11/21/22 1500 11/21/22 1516 11/21/22 1915 11/22/22 0055 11/22/22 0306 11/22/22 1328 11/23/22 0045 11/24/22 0014 11/25/22 0006 11/25/22 0915  WBC 22.6*  --   --  18.8*  --   --  15.4* 18.5* 16.6*  --   CREATININE 1.18   < >  --   --  1.07 0.83 0.87 1.02 0.99  --   LATICACIDVEN  --   --  1.8  --  1.2  --   --   --   --   --   VANCOTROUGH  --   --   --   --   --   --   --   --   --  10*  VANCOPEAK  --   --   --   --   --   --   --   --  23*  --    < > = values in this interval not displayed.     Estimated Creatinine Clearance: 85.6 mL/min (by C-G formula based on SCr of 0.99 mg/dL).    No Known Allergies  Antimicrobials this admission: Vancomycin 5/28 >>   Ceftriaxone 5/28 >> 5/30 Azithromycin 5/28 >> 5/30  Microbiology results: 5/29 BCx: ngtd x 2d 5/28 Pleural fluid cx:  MRSA 5/28 BCx: ngtd x3 d 5/28 COVID: positive 5/28 MRSA PCR: positive  Thank you for involving pharmacy in this patient's care.  Enos Fling, PharmD PGY2 Pharmacy  Resident 11/25/2022 12:19 PM

## 2022-11-26 ENCOUNTER — Inpatient Hospital Stay (HOSPITAL_COMMUNITY): Payer: Federal, State, Local not specified - PPO

## 2022-11-26 DIAGNOSIS — R079 Chest pain, unspecified: Secondary | ICD-10-CM | POA: Diagnosis not present

## 2022-11-26 DIAGNOSIS — J189 Pneumonia, unspecified organism: Secondary | ICD-10-CM | POA: Diagnosis not present

## 2022-11-26 LAB — MAGNESIUM: Magnesium: 2.1 mg/dL (ref 1.7–2.4)

## 2022-11-26 LAB — ECHOCARDIOGRAM COMPLETE
Area-P 1/2: 3.17 cm2
Height: 71 in
S' Lateral: 3.4 cm
Weight: 3061.75 oz

## 2022-11-26 LAB — COMPREHENSIVE METABOLIC PANEL
ALT: 11 U/L (ref 0–44)
AST: 16 U/L (ref 15–41)
Albumin: 2 g/dL — ABNORMAL LOW (ref 3.5–5.0)
Alkaline Phosphatase: 115 U/L (ref 38–126)
Anion gap: 8 (ref 5–15)
BUN: 13 mg/dL (ref 6–20)
CO2: 27 mmol/L (ref 22–32)
Calcium: 7.7 mg/dL — ABNORMAL LOW (ref 8.9–10.3)
Chloride: 96 mmol/L — ABNORMAL LOW (ref 98–111)
Creatinine, Ser: 0.9 mg/dL (ref 0.61–1.24)
GFR, Estimated: >60 mL/min (ref >60)
Glucose, Bld: 273 mg/dL — ABNORMAL HIGH (ref 70–99)
Potassium: 4.5 mmol/L (ref 3.5–5.1)
Sodium: 131 mmol/L — ABNORMAL LOW (ref 135–145)
Total Bilirubin: 0.7 mg/dL (ref 0.3–1.2)
Total Protein: 5.7 g/dL — ABNORMAL LOW (ref 6.5–8.1)

## 2022-11-26 LAB — CBC WITH DIFFERENTIAL/PLATELET
Abs Immature Granulocytes: 0.13 10*3/uL — ABNORMAL HIGH (ref 0.00–0.07)
Basophils Absolute: 0 10*3/uL (ref 0.0–0.1)
Basophils Relative: 0 %
Eosinophils Absolute: 0.3 10*3/uL (ref 0.0–0.5)
Eosinophils Relative: 2 %
HCT: 36.2 % — ABNORMAL LOW (ref 39.0–52.0)
Hemoglobin: 11.8 g/dL — ABNORMAL LOW (ref 13.0–17.0)
Immature Granulocytes: 1 %
Lymphocytes Relative: 7 %
Lymphs Abs: 1.1 10*3/uL (ref 0.7–4.0)
MCH: 30.3 pg (ref 26.0–34.0)
MCHC: 32.6 g/dL (ref 30.0–36.0)
MCV: 93.1 fL (ref 80.0–100.0)
Monocytes Absolute: 1 10*3/uL (ref 0.1–1.0)
Monocytes Relative: 7 %
Neutro Abs: 12.6 10*3/uL — ABNORMAL HIGH (ref 1.7–7.7)
Neutrophils Relative %: 83 %
Platelets: 356 10*3/uL (ref 150–400)
RBC: 3.89 MIL/uL — ABNORMAL LOW (ref 4.22–5.81)
RDW: 14.3 % (ref 11.5–15.5)
WBC: 15.2 10*3/uL — ABNORMAL HIGH (ref 4.0–10.5)
nRBC: 0 % (ref 0.0–0.2)

## 2022-11-26 LAB — TSH: TSH: 2.54 u[IU]/mL (ref 0.350–4.500)

## 2022-11-26 LAB — GLUCOSE, CAPILLARY
Glucose-Capillary: 249 mg/dL — ABNORMAL HIGH (ref 70–99)
Glucose-Capillary: 239 mg/dL — ABNORMAL HIGH (ref 70–99)
Glucose-Capillary: 170 mg/dL — ABNORMAL HIGH (ref 70–99)
Glucose-Capillary: 92 mg/dL (ref 70–99)

## 2022-11-26 LAB — RAPID URINE DRUG SCREEN, HOSP PERFORMED
Amphetamines: NOT DETECTED
Barbiturates: NOT DETECTED
Benzodiazepines: NOT DETECTED
Cocaine: NOT DETECTED
Opiates: POSITIVE — AB
Tetrahydrocannabinol: NOT DETECTED

## 2022-11-26 LAB — AMMONIA: Ammonia: 24 umol/L (ref 9–35)

## 2022-11-26 LAB — VITAMIN B12: Vitamin B-12: 668 pg/mL (ref 180–914)

## 2022-11-26 LAB — T4, FREE: Free T4: 1.28 ng/dL — ABNORMAL HIGH (ref 0.61–1.12)

## 2022-11-26 LAB — CULTURE, BLOOD (ROUTINE X 2)

## 2022-11-26 MED ORDER — VANCOMYCIN HCL 1500 MG/300ML IV SOLN
1500.0000 mg | Freq: Once | INTRAVENOUS | Status: AC
  Administered 2022-11-27: 1500 mg via INTRAVENOUS
  Filled 2022-11-26: qty 300

## 2022-11-26 MED ORDER — ZOLPIDEM TARTRATE 5 MG PO TABS: 5.0000 mg | ORAL_TABLET | Freq: Every evening | ORAL | Status: DC | PRN

## 2022-11-26 MED ORDER — OXYCODONE HCL 5 MG PO TABS
10.0000 mg | ORAL_TABLET | ORAL | Status: DC | PRN
  Administered 2022-11-27 – 2022-11-28 (×3): 10 mg via ORAL
  Filled 2022-11-26 (×5): qty 2

## 2022-11-26 MED ORDER — LACTULOSE 10 GM/15ML PO SOLN
30.0000 g | Freq: Two times a day (BID) | ORAL | Status: DC
  Administered 2022-11-26 – 2022-11-30 (×2): 30 g via ORAL
  Filled 2022-11-26 (×8): qty 45

## 2022-11-26 MED ORDER — NAPROXEN 250 MG PO TABS
500.0000 mg | ORAL_TABLET | Freq: Three times a day (TID) | ORAL | Status: DC
  Administered 2022-11-26 – 2022-12-01 (×16): 500 mg via ORAL
  Filled 2022-11-26 (×17): qty 2

## 2022-11-26 MED ORDER — GABAPENTIN 400 MG PO CAPS
400.0000 mg | ORAL_CAPSULE | Freq: Three times a day (TID) | ORAL | Status: DC
  Administered 2022-11-26 – 2022-12-01 (×16): 400 mg via ORAL
  Filled 2022-11-26 (×7): qty 1
  Filled 2022-11-26: qty 4
  Filled 2022-11-26 (×3): qty 1
  Filled 2022-11-26: qty 4
  Filled 2022-11-26 (×4): qty 1

## 2022-11-26 MED ORDER — HYDROMORPHONE HCL 1 MG/ML IJ SOLN: 0.5000 mg | INTRAMUSCULAR | Status: DC | PRN

## 2022-11-26 MED ORDER — HYDROCOD POLI-CHLORPHE POLI ER 10-8 MG/5ML PO SUER
5.0000 mL | Freq: Two times a day (BID) | ORAL | Status: DC | PRN
  Administered 2022-11-27 – 2022-11-30 (×3): 5 mL via ORAL
  Filled 2022-11-26 (×3): qty 5

## 2022-11-26 MED ORDER — LORAZEPAM 0.5 MG PO TABS
0.5000 mg | ORAL_TABLET | ORAL | Status: DC | PRN
  Administered 2022-11-26: 0.5 mg via ORAL
  Filled 2022-11-26: qty 1

## 2022-11-26 MED ORDER — VANCOMYCIN HCL 1250 MG/250ML IV SOLN
1250.0000 mg | Freq: Two times a day (BID) | INTRAVENOUS | Status: DC
  Administered 2022-11-27: 1250 mg via INTRAVENOUS
  Filled 2022-11-26: qty 250

## 2022-11-26 NOTE — Progress Notes (Signed)
[facility] Progress Note Patient: Louis [name] WUJ:[id number] DOB: [birth year] DOA: 11/21/2022  DOS: the patient was seen and examined on 11/26/2022  Brief hospital course: HTN, DM2, SIADH, MRSA bacteremia and osteomyelitis not on chronic ABx.   May 1 week COVID-positive.  Did not receive any therapy. May last week recurrence of shortness of breath and cough.  Treated outpatient with antibiotics and prednisone.  Found to have large right pneumothorax as well as Staph aureus pneumonia. Assessment and Plan: Staph aureus pneumonia. Presents with symptoms of upper respiratory infection ongoing for last 1 week treated with outpatient antibiotics and steroids without any improvement. Found to have tension pneumothorax at the time of admission. Pleural fluid culture growing Staph aureus positive for MRSA.  Sensitive to doxycycline, Zyvox, clindamycin. Will continue with IV vancomycin for now.  CT chest performed.  Shows evidence of loculated pleural effusion. PCCM recommended continuing current antibiotic and pain control. Currently 14-day antibiotic course recommended. Patient was transitioned to oral Zyvox on 6/1. Some confusion and agitation on 6/2.  Etiology is not clear but Zyvox is now switched to vancomycin for now.  Tension pneumothorax. SP chest tube placement. Chest tube got dislodged on 5/30 night.  Chest x-ray after that showed no pneumothorax.  Acute hypoxic respiratory failure. Secondary to above. Monitor for improvement in oxygenation after resolution of the pneumothorax. Able to come down to 3 LPM on 6/2 with saturations of 90%.  Recent COVID infection. Patient's COVID test was positive this admission.  CT value 37. Presentation is mostly secondary to Staph aureus bacteria pneumonia. Had a COVID infection early May. At present does not require any isolation.  Hyponatremia/SIADH. Sodium level stable. Monitor.  Type 2 diabetes mellitus, uncontrolled with hyperglycemia  without long-term insulin use and complication. Currently on sliding scale insulin.  HTN. Blood pressure stable. Currently holding home regimen.  Left-sided chest pain. Medical adjusted to PCCM so far.  Acute confusional state. Patient acutely confused on 6/2. No history of alcohol abuse. UDS negative for any other substance abuse. On Lamictal which she is receiving consistently. Only new medication was Zyvox in the last 24 hours. No asterixis.  No focal deficit at the time of my evaluation.  No tremors. Monitor for now.  Ativan as needed.  Gabapentin dose increased.  Zyvox changed to vancomycin again.  Subjective: Acutely confused.  No nausea no vomiting.  Per RN had hallucination as well.  Able to answer questions appropriately, my evaluation.  Denies any acute complaint.  Pain well-controlled.  Oral intake adequate.  Physical Exam: In mild distress.  S1-S2 present.  Right basal crackles.  No edema.  Bowel sound present.  No tenderness. No instructions.  Alert awake and oriented x 3.  No focal deficit.  Finger-nose-finger normal. No tremors. No nystagmus.  Data Reviewed: I have Reviewed nursing notes, Vitals, and Lab results. Reviewed CBC and BMP.  Reordered CBC and BMP.  Discussed with PCCM.  Disposition: Status is: Inpatient Remains inpatient appropriate because: Need for IV antibiotic and improvement in mentation.  enoxaparin (LOVENOX) injection 40 mg Start: 11/22/22 1000   Family Communication: No one at bedside Level of care: Progressive  Vitals:   11/25/22 1932 11/25/22 2310 11/26/22 0325 11/26/22 1156  BP: 121/71 123/72 113/73   Pulse: 90 85 87 86  Resp: (!) 22 13 16 18   Temp: 98.1 F (36.7 C) 98.3 F (36.8 C) 97.8 F (36.6 C) 97.8 F (36.6 C)  TempSrc: Oral Oral Oral Oral  SpO2: 96% 95% 95% (!) 89%  Weight:      Height:         Author: Lynden Oxford, MD 11/26/2022 4:21 PM  Please look on [email or web address] to find out who is on call.

## 2022-11-26 NOTE — Progress Notes (Signed)
Pharmacy Antibiotic Note  Louis [name] is a 57 y.o. male admitted on 11/21/2022 with pneumonia.  Pharmacy has been consulted for vancomycin dosing. Patient had URI x 1 week prior to admission. Symptoms worsened and patient presented to the ED requiring chest tube placement. Noted to be COVID positive. PMH significant for MRSA bacteremia with epidural abscess in 2023. Body fluid culture 11/21/22 with MRSA. COVID positive 11/21/22.  Vancomycin peak of 23 and trough of 10 drawn 11/25/22 leads to calculated AUC of 406. WBC remains elevated at 16.6. Creatinine is stable.  Patient was transitioned to linezolid PO in anticipation for discharge and experienced altered mental status. This is not typical of linezolid, but discussed with MD and will transition back to vancomycin for now and monitor for AMS resolution.  Plan: Give low vancomycin 1500 mg load once given last dose 5/31, then continue 1250 mg every 12 hours for Louis [name] of 507 and Csmin of 12 based on kinetics Monitor AMS improvement and d/c antibiotics  Height: 5\' 11"  (180.3 cm) Weight: 86.8 kg (191 lb 5.8 oz) IBW/kg (Calculated) : 75.3  Temp (24hrs), Avg:98.1 F (36.7 C), Min:97.8 F (36.6 C), Max:98.3 F (36.8 C)  Recent Labs  Lab 11/21/22 1915 11/22/22 0055 11/22/22 0306 11/22/22 1328 11/23/22 0045 11/24/22 0014 11/25/22 0006 11/25/22 0915 11/26/22 1040  WBC  --  18.8*  --   --  15.4* 18.5* 16.6*  --  15.2*  CREATININE  --   --  1.07 0.83 0.87 1.02 0.99  --  0.90  LATICACIDVEN 1.8  --  1.2  --   --   --   --   --   --   VANCOTROUGH  --   --   --   --   --   --   --  10*  --   VANCOPEAK  --   --   --   --   --   --  23*  --   --      Estimated Creatinine Clearance: 94.1 mL/min (by C-G formula based on SCr of 0.9 mg/dL).    No Known Allergies  Antimicrobials this admission: Vancomycin 5/28 >> 6/1, 6/2 >> Linezolid 6/1 >>6/2 Ceftriaxone 5/28 >> 5/30 Azithromycin 5/28 >> 5/30  Microbiology results: 5/29 BCx: ngtd x 2d 5/28  Pleural fluid cx:  MRSA 5/28 BCx: ngtd x3 d 5/28 COVID: positive 5/28 MRSA PCR: positive  Thank you for involving pharmacy in this patient's care.  Enos Fling, PharmD PGY2 Pharmacy Resident 11/26/2022 2:11 PM

## 2022-11-26 NOTE — Progress Notes (Signed)
Echocardiogram 2D Echocardiogram has been performed.  Warren Lacy Maud Rubendall RDCS 11/26/2022, 1:48 PM

## 2022-11-27 DIAGNOSIS — J189 Pneumonia, unspecified organism: Secondary | ICD-10-CM | POA: Diagnosis not present

## 2022-11-27 LAB — COMPREHENSIVE METABOLIC PANEL
ALT: 13 U/L (ref 0–44)
AST: 13 U/L — ABNORMAL LOW (ref 15–41)
Albumin: 1.9 g/dL — ABNORMAL LOW (ref 3.5–5.0)
Alkaline Phosphatase: 97 U/L (ref 38–126)
Anion gap: 10 (ref 5–15)
BUN: 9 mg/dL (ref 6–20)
CO2: 28 mmol/L (ref 22–32)
Calcium: 7.7 mg/dL — ABNORMAL LOW (ref 8.9–10.3)
Chloride: 99 mmol/L (ref 98–111)
Creatinine, Ser: 0.68 mg/dL (ref 0.61–1.24)
GFR, Estimated: >60 mL/min (ref >60)
Glucose, Bld: 100 mg/dL — ABNORMAL HIGH (ref 70–99)
Potassium: 3.5 mmol/L (ref 3.5–5.1)
Sodium: 137 mmol/L (ref 135–145)
Total Bilirubin: 0.9 mg/dL (ref 0.3–1.2)
Total Protein: 5.3 g/dL — ABNORMAL LOW (ref 6.5–8.1)

## 2022-11-27 LAB — CBC WITH DIFFERENTIAL/PLATELET
Abs Immature Granulocytes: 0.13 10*3/uL — ABNORMAL HIGH (ref 0.00–0.07)
Basophils Absolute: 0 10*3/uL (ref 0.0–0.1)
Basophils Relative: 0 %
Eosinophils Absolute: 0.4 10*3/uL (ref 0.0–0.5)
Eosinophils Relative: 3 %
HCT: 34.1 % — ABNORMAL LOW (ref 39.0–52.0)
Hemoglobin: 11.3 g/dL — ABNORMAL LOW (ref 13.0–17.0)
Immature Granulocytes: 1 %
Lymphocytes Relative: 9 %
Lymphs Abs: 1.4 10*3/uL (ref 0.7–4.0)
MCH: 30.2 pg (ref 26.0–34.0)
MCHC: 33.1 g/dL (ref 30.0–36.0)
MCV: 91.2 fL (ref 80.0–100.0)
Monocytes Absolute: 1 10*3/uL (ref 0.1–1.0)
Monocytes Relative: 6 %
Neutro Abs: 13.3 10*3/uL — ABNORMAL HIGH (ref 1.7–7.7)
Neutrophils Relative %: 81 %
Platelets: 387 10*3/uL (ref 150–400)
RBC: 3.74 MIL/uL — ABNORMAL LOW (ref 4.22–5.81)
RDW: 14.2 % (ref 11.5–15.5)
WBC: 16.2 10*3/uL — ABNORMAL HIGH (ref 4.0–10.5)
nRBC: 0 % (ref 0.0–0.2)

## 2022-11-27 LAB — MAGNESIUM: Magnesium: 1.9 mg/dL (ref 1.7–2.4)

## 2022-11-27 LAB — GLUCOSE, CAPILLARY
Glucose-Capillary: 84 mg/dL (ref 70–99)
Glucose-Capillary: 143 mg/dL — ABNORMAL HIGH (ref 70–99)
Glucose-Capillary: 163 mg/dL — ABNORMAL HIGH (ref 70–99)
Glucose-Capillary: 293 mg/dL — ABNORMAL HIGH (ref 70–99)

## 2022-11-27 LAB — CULTURE, BLOOD (ROUTINE X 2): Culture: NO GROWTH

## 2022-11-27 MED ORDER — DOXYCYCLINE HYCLATE 100 MG PO TABS
100.0000 mg | ORAL_TABLET | Freq: Two times a day (BID) | ORAL | Status: DC
  Administered 2022-11-27 – 2022-12-01 (×9): 100 mg via ORAL
  Filled 2022-11-27 (×9): qty 1

## 2022-11-27 NOTE — Progress Notes (Signed)
OT Cancellation Note  Patient Details Name: Louis [name] MRN: [id number] DOB: 1966/05/30   Cancelled Treatment:    Reason Eval/Treat Not Completed: OT screened, no needs identified, will sign off (per PT, pt independent with no OT needs identified. OT will s/o. Please reconsult if there is a change in pt status)  Carver Fila, OTD, OTR/L SecureChat [facility] [phone]   Dalphine Handing 11/27/2022, 10:47 AM

## 2022-11-27 NOTE — Evaluation (Signed)
Physical Therapy Evaluation/ Discharge Patient Details Name: Louis [name] MRN: [id number] DOB: 03-Jan-1966 Today's Date: 11/27/2022  History of Present Illness  57 y.o. male adm 5/30 with URI, MRSA PNA and Rt PTX with chest tube placement. PMHx: HTN, T2DM, SIADH, MRSA bacteremia and osteomyelitis  Clinical Impression  Pt pleasant and reports he loves to joke around and give people a hard time. 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 Pt able to walk 200' on RA with SPO2 93% with drop to 87% after stairs and required 2L for remainder of gait to keep SPO2 >89%. HR 98. Pt educated for breathing technique, monitoring SPO2 and safety as well as encouraged gait acutely. No further acute therapy needs at this time with pt aware and agreeable, will sign off.         Recommendations for follow up therapy are one component of a multi-disciplinary discharge planning process, led by the attending physician.  Recommendations may be updated based on patient status, additional functional criteria and insurance authorization.  Follow Up Recommendations       Assistance Recommended at Discharge PRN  Patient can return home with the following       Equipment Recommendations None recommended by PT  Recommendations for Other Services       Functional Status Assessment Patient has not had a recent decline in their functional status     Precautions / Restrictions Precautions Precautions: Other (comment) Precaution Comments: watch sats      Mobility  Bed Mobility Overal bed mobility: Modified Independent                  Transfers Overall transfer level: Modified independent                      Ambulation/Gait Ambulation/Gait assistance: Modified independent (Device/Increase time) Gait Distance (Feet): 400 Feet Assistive device: None Gait Pattern/deviations: WFL(Within Functional Limits)       General Gait Details: intermittent use of rail. Pt reported out of habit he  will use any safety measures available but able to walk without UB support as well  Stairs Stairs: Yes Stairs assistance: Modified independent (Device/Increase time) Stair Management: One rail Left, Step to pattern, Forwards Number of Stairs: 4 General stair comments: pt descendign with UB support on rail and cautious with mobility, alternating ascending and step to on descent. Desaturation to 87% on RA with stairs requiring return to 2L for remainder of gait at 92%  Wheelchair Mobility    Modified Rankin (Stroke Patients Only)       Balance Overall balance assessment: Mild deficits observed, not formally tested                                           Pertinent Vitals/Pain Pain Assessment Pain Assessment: No/denies pain    Home Living Family/patient expects to be discharged to:: Private residence Living Arrangements: Spouse/significant other;Children Available Help at Discharge: Family;Available 24 hours/day Type of Home: House Home Access: Stairs to enter   [employer] of Steps: 4   Home Layout: One level Home Equipment: Shower seat - built [employer] fundraiser (2 wheels);BSC/3in1      Prior Function Prior Level of Function : Independent/Modified Independent;Driving;Working/employed                     Hand Dominance  Extremity/Trunk Assessment   Upper Extremity Assessment Upper Extremity Assessment: Overall WFL for tasks assessed    Lower Extremity Assessment Lower Extremity Assessment: Overall WFL for tasks assessed    Cervical / Trunk Assessment Cervical / Trunk Assessment: Normal  Communication   Communication: No difficulties  Cognition Arousal/Alertness: Awake/alert Behavior During Therapy: WFL for tasks assessed/performed Overall Cognitive Status: Within Functional Limits for tasks assessed                                          General Comments      Exercises      Assessment/Plan    PT Assessment Patient does not need any further PT services  PT Problem List         PT Treatment Interventions      PT Goals (Current goals can be found in the Care Plan section)  Acute Rehab PT Goals PT Goal Formulation: All assessment and education complete, DC therapy    Frequency       Co-evaluation               AM-PAC PT "6 Clicks" Mobility  Outcome Measure Help needed turning from your back to your side while in a flat bed without using bedrails?: None Help needed moving from lying on your back to sitting on the side of a flat bed without using bedrails?: None Help needed moving to and from a bed to a chair (including a wheelchair)?: None Help needed standing up from a chair using your arms (e.g., wheelchair or bedside chair)?: None Help needed to walk in hospital room?: None Help needed climbing 3-5 steps with a railing? : None 6 Click Score: 24    End of Session Equipment Utilized During Treatment: Oxygen Activity Tolerance: Patient tolerated treatment well Patient left: in chair;with call bell/phone within reach Nurse Communication: Mobility status PT Visit Diagnosis: Other abnormalities of gait and mobility (R26.89)    Time: 8295-6213 PT Time Calculation (min) (ACUTE ONLY): 27 min   Charges:   PT Evaluation $PT Eval Moderate Complexity: 1 Mod PT Treatments $Gait Training: 8-22 mins        Merryl Hacker, PT Acute Rehabilitation Services Office: [phone]   Louis [name] 11/27/2022, 10:10 AM

## 2022-11-27 NOTE — Progress Notes (Signed)
[facility] Progress Note Patient: Louis [name] ZOX:[id number] DOB: 03/30/1966 DOA: 11/21/2022  DOS: the patient was seen and examined on 11/27/2022  Brief hospital course: HTN, DM2, SIADH, MRSA bacteremia and osteomyelitis not on chronic ABx.   May 1 week COVID-positive.  Did not receive any therapy. May last week recurrence of shortness of breath and cough.  Treated outpatient with antibiotics and prednisone.  Found to have large right pneumothorax as well as Staph aureus pneumonia. Assessment and Plan: Staph aureus pneumonia. Presents with symptoms of upper respiratory infection ongoing for last 1 week treated with outpatient antibiotics and steroids without any improvement. Found to have tension pneumothorax at the time of admission. Pleural fluid culture growing Staph aureus positive for MRSA.  Sensitive to doxycycline, Zyvox, clindamycin. Will continue with IV vancomycin for now.  CT chest performed.  Shows evidence of loculated pleural effusion. PCCM recommended continuing current antibiotic and pain control. Currently 14-day antibiotic course recommended. Patient was transitioned to oral Zyvox on 6/1. Some confusion and agitation on 6/2.  Etiology is not clear but Zyvox was switched to vancomycin.  Now on oral doxycycline.  Will monitor.  Tension pneumothorax. SP chest tube placement. Chest tube got dislodged on 5/30 night.  Chest x-ray after that showed no pneumothorax.  Acute hypoxic respiratory failure. Secondary to above. Monitor for improvement in oxygenation after resolution of the pneumothorax. Able to come down to 3 LPM on 6/2 with saturations of 90%.  Recent COVID infection. Patient's COVID test was positive this admission.  CT value 37. Presentation is mostly secondary to Staph aureus bacteria pneumonia. Had a COVID infection early May. At present does not require any isolation.  Hyponatremia/SIADH. Sodium level stable. Monitor.  Type 2 diabetes mellitus,  uncontrolled with hyperglycemia without long-term insulin use and complication. Currently on sliding scale insulin.  HTN. Blood pressure stable. Currently holding home regimen.  Left-sided chest pain. Medical adjusted to PCCM so far.  Acute confusional state.  Resolved. Patient acutely confused on 6/2. No history of alcohol abuse. UDS negative for any other substance abuse. On Lamictal which she is receiving consistently. Only new medication was Zyvox in the last 24 hours. No asterixis.  No focal deficit at the time of my evaluation.  No tremors. Monitor for now.  Ativan as needed.  Gabapentin dose increased.  Zyvox changed to vancomycin again.  Now on doxycycline.  Subjective: Confusion resolved.  No nausea no vomiting no fever no chills.  Reports ongoing cough.  Physical Exam: In moderate distress. S1-S2 present. Right basilar crackles. No edema.  No focal deficit.  No tremors.  No asterixis.  Data Reviewed: I have Reviewed nursing notes, Vitals, and Lab results. Reviewed CBC and BMP.  Reordered CBC and BMP.    Disposition: Status is: Inpatient Remains inpatient appropriate because: Monitor for response antibiotics.  enoxaparin (LOVENOX) injection 40 mg Start: 11/22/22 1000   Family Communication: No one at bedside Level of care: Progressive  Vitals:   11/27/22 0421 11/27/22 0800 11/27/22 0807 11/27/22 1006  BP: 128/73  116/76   Pulse:   88 98  Resp:      Temp: 97.6 F (36.4 C)  97.6 F (36.4 C)   TempSrc: Oral  Oral   SpO2:  96% 95% 92%  Weight:      Height:         Author: Lynden Oxford, MD 11/27/2022 4:01 PM  Please look on [email or web address] to find out who is on call.

## 2022-11-28 ENCOUNTER — Inpatient Hospital Stay (HOSPITAL_COMMUNITY): Payer: Federal, State, Local not specified - PPO

## 2022-11-28 DIAGNOSIS — J189 Pneumonia, unspecified organism: Secondary | ICD-10-CM | POA: Diagnosis not present

## 2022-11-28 DIAGNOSIS — Z8616 Personal history of COVID-19: Secondary | ICD-10-CM | POA: Diagnosis not present

## 2022-11-28 DIAGNOSIS — J15212 Pneumonia due to Methicillin resistant Staphylococcus aureus: Secondary | ICD-10-CM | POA: Diagnosis not present

## 2022-11-28 DIAGNOSIS — R0602 Shortness of breath: Secondary | ICD-10-CM | POA: Diagnosis not present

## 2022-11-28 DIAGNOSIS — Z20822 Contact with and (suspected) exposure to covid-19: Secondary | ICD-10-CM | POA: Diagnosis not present

## 2022-11-28 DIAGNOSIS — J439 Emphysema, unspecified: Secondary | ICD-10-CM | POA: Diagnosis not present

## 2022-11-28 DIAGNOSIS — G928 Other toxic encephalopathy: Secondary | ICD-10-CM | POA: Diagnosis not present

## 2022-11-28 DIAGNOSIS — J90 Pleural effusion, not elsewhere classified: Secondary | ICD-10-CM | POA: Diagnosis not present

## 2022-11-28 LAB — CBC WITH DIFFERENTIAL/PLATELET
Abs Immature Granulocytes: 0.14 10*3/uL — ABNORMAL HIGH (ref 0.00–0.07)
Basophils Absolute: 0 10*3/uL (ref 0.0–0.1)
Basophils Relative: 0 %
Eosinophils Absolute: 0.4 10*3/uL (ref 0.0–0.5)
Eosinophils Relative: 3 %
HCT: 34.2 % — ABNORMAL LOW (ref 39.0–52.0)
Hemoglobin: 11.4 g/dL — ABNORMAL LOW (ref 13.0–17.0)
Immature Granulocytes: 1 %
Lymphocytes Relative: 11 %
Lymphs Abs: 1.5 10*3/uL (ref 0.7–4.0)
MCH: 30.9 pg (ref 26.0–34.0)
MCHC: 33.3 g/dL (ref 30.0–36.0)
MCV: 92.7 fL (ref 80.0–100.0)
Monocytes Absolute: 1 10*3/uL (ref 0.1–1.0)
Monocytes Relative: 7 %
Neutro Abs: 11.1 10*3/uL — ABNORMAL HIGH (ref 1.7–7.7)
Neutrophils Relative %: 78 %
Platelets: 429 10*3/uL — ABNORMAL HIGH (ref 150–400)
RBC: 3.69 MIL/uL — ABNORMAL LOW (ref 4.22–5.81)
RDW: 14.5 % (ref 11.5–15.5)
WBC: 14.1 10*3/uL — ABNORMAL HIGH (ref 4.0–10.5)
nRBC: 0 % (ref 0.0–0.2)

## 2022-11-28 LAB — GLUCOSE, CAPILLARY
Glucose-Capillary: 165 mg/dL — ABNORMAL HIGH (ref 70–99)
Glucose-Capillary: 138 mg/dL — ABNORMAL HIGH (ref 70–99)
Glucose-Capillary: 199 mg/dL — ABNORMAL HIGH (ref 70–99)
Glucose-Capillary: 324 mg/dL — ABNORMAL HIGH (ref 70–99)

## 2022-11-28 LAB — TROPONIN I (HIGH SENSITIVITY)
Troponin I (High Sensitivity): 10 ng/L (ref <18)
Troponin I (High Sensitivity): 10 ng/L (ref <18)

## 2022-11-28 LAB — COMPREHENSIVE METABOLIC PANEL
ALT: 13 U/L (ref 0–44)
AST: 14 U/L — ABNORMAL LOW (ref 15–41)
Albumin: 1.8 g/dL — ABNORMAL LOW (ref 3.5–5.0)
Alkaline Phosphatase: 95 U/L (ref 38–126)
Anion gap: 8 (ref 5–15)
BUN: 10 mg/dL (ref 6–20)
CO2: 27 mmol/L (ref 22–32)
Calcium: 7.8 mg/dL — ABNORMAL LOW (ref 8.9–10.3)
Chloride: 100 mmol/L (ref 98–111)
Creatinine, Ser: 0.93 mg/dL (ref 0.61–1.24)
GFR, Estimated: >60 mL/min (ref >60)
Glucose, Bld: 214 mg/dL — ABNORMAL HIGH (ref 70–99)
Potassium: 3.7 mmol/L (ref 3.5–5.1)
Sodium: 135 mmol/L (ref 135–145)
Total Bilirubin: 0.9 mg/dL (ref 0.3–1.2)
Total Protein: 5.7 g/dL — ABNORMAL LOW (ref 6.5–8.1)

## 2022-11-28 LAB — D-DIMER, QUANTITATIVE: D-Dimer, Quant: 9.29 ug/mL-FEU — ABNORMAL HIGH (ref 0.00–0.50)

## 2022-11-28 LAB — MAGNESIUM: Magnesium: 2 mg/dL (ref 1.7–2.4)

## 2022-11-28 MED ORDER — IOHEXOL 350 MG/ML SOLN
50.0000 mL | Freq: Once | INTRAVENOUS | Status: AC | PRN
  Administered 2022-11-28: 50 mL via INTRAVENOUS

## 2022-11-28 NOTE — Progress Notes (Signed)
H. Sink RT from Ct recommend the patient to get 20 g for the CT angio. The STAT CT is going to delay a little because IV team has to come and get a new IV. Dr. Joneen Roach notified . IV team in the room trying to get a new IV

## 2022-11-28 NOTE — Progress Notes (Signed)
The patient is complaining of SOB. Stated, " I  don't feel good." He also complain of right sided flank pain of 9/10 on a pain scale but he refused to take any strong pain medication. Paged Dr. Joneen Roach and received a new order for CXR STAT.

## 2022-11-28 NOTE — Progress Notes (Signed)
[facility] Progress Note Patient: Louis [name] ZOX:[id number] DOB: [birth year] DOA: 11/21/2022  DOS: the patient was seen and examined on 11/28/2022  Brief hospital course: HTN, DM2, SIADH, MRSA bacteremia and osteomyelitis not on chronic ABx.   May 1 week COVID-positive.  Did not receive any therapy. May last week recurrence of shortness of breath and cough.  Treated outpatient with antibiotics and prednisone.  Found to have large right pneumothorax as well as Staph aureus pneumonia. Assessment and Plan: Staph aureus pneumonia. Presents with symptoms of upper respiratory infection ongoing for last 1 week treated with outpatient antibiotics and steroids without any improvement. Found to have tension pneumothorax at the time of admission. Pleural fluid culture growing Staph aureus positive for MRSA.  Sensitive to doxycycline, Zyvox, clindamycin. Will continue with IV vancomycin for now.  CT chest performed.  Shows evidence of loculated pleural effusion. PCCM recommended continuing current antibiotic and pain control. Currently 14-day antibiotic course recommended. Patient was transitioned to oral Zyvox on 6/1. Some confusion and agitation on 6/2.  Etiology is not clear but Zyvox was switched to vancomycin.  With resolution of agitation. Now on oral doxycycline.  Will monitor.  Tension pneumothorax. SP chest tube placement. Chest tube got dislodged on 5/30 night.  Chest x-ray after that showed no pneumothorax.  Acute hypoxic respiratory failure. Secondary to above. Monitor for improvement in oxygenation after resolution of the pneumothorax. Able to come down to 3 LPM on 6/2 with saturations of 90%.  Recent COVID infection. Patient's COVID test was positive this admission.  CT value 37. Presentation is mostly secondary to Staph aureus bacteria pneumonia. Had a COVID infection early May. At present does not require any isolation.  Hyponatremia/SIADH. Sodium level  stable. Monitor.  Type 2 diabetes mellitus, uncontrolled with hyperglycemia without long-term insulin use and complication. Currently on sliding scale insulin.  HTN. Blood pressure stable. Currently holding home regimen.  Chest pain. Ongoing due to pneumonia. CT PE protocol was ordered which is negative for PE.  Troponin negative.  Echocardiogram shows preserved EF.  Acute confusional state.  Resolved. Patient acutely confused on 6/2. No history of alcohol abuse. UDS negative for any other substance abuse. On Lamictal which she is receiving consistently. Only new medication was Zyvox in the last 24 hours. No asterixis.  No focal deficit at the time of my evaluation.  No tremors. Monitor for now.  Ativan as needed.  Gabapentin dose increased.  Zyvox changed to vancomycin again.  Now on doxycycline.  Subjective: No acute.  Ongoing cough ongoing chest pain.  No nausea or vomiting.  Overnight had some chest pain requiring further workup.  Physical Exam: In mild distress. S1-S2 present. Basal crackles.  Data Reviewed: I have Reviewed nursing notes, Vitals, and Lab results. Reviewed CBC and BMP.  Reviewed CT scan.  Reordered CBC and BMP.  Disposition: Status is: Inpatient Remains inpatient appropriate because: Monitor for stability of symptoms overnight.  enoxaparin (LOVENOX) injection 40 mg Start: 11/22/22 1000   Family Communication: No one at bedside Level of care: Progressive  Vitals:   11/28/22 0827 11/28/22 1040 11/28/22 1638 11/28/22 1951  BP: 118/70 106/63 105/64 98/74  Pulse: 83 84 67 68  Resp:      Temp: (!) 97.2 F (36.2 C) 98 F (36.7 C) 98.5 F (36.9 C) 98.4 F (36.9 C)  TempSrc: Oral Oral Oral Oral  SpO2: 95% 96%    Weight:      Height:         Author: Lynden Oxford,  MD 11/28/2022 7:57 PM  Please look on [email or web address] to find out who is on call.

## 2022-11-28 NOTE — Progress Notes (Addendum)
Dr. Joneen Roach at the bedside assessing the patient. New order for  D-dimer ordered.

## 2022-11-28 NOTE — Progress Notes (Signed)
CT notified that this RN is ready to bring the patient for the CTA. I was told they have emergent cases in front of him and they will call as soon as possible.

## 2022-11-29 DIAGNOSIS — J9601 Acute respiratory failure with hypoxia: Secondary | ICD-10-CM | POA: Diagnosis not present

## 2022-11-29 DIAGNOSIS — J9383 Other pneumothorax: Secondary | ICD-10-CM | POA: Diagnosis not present

## 2022-11-29 DIAGNOSIS — E222 Syndrome of inappropriate secretion of antidiuretic hormone: Secondary | ICD-10-CM | POA: Diagnosis not present

## 2022-11-29 DIAGNOSIS — J189 Pneumonia, unspecified organism: Secondary | ICD-10-CM | POA: Diagnosis not present

## 2022-11-29 LAB — CBC WITH DIFFERENTIAL/PLATELET
Abs Immature Granulocytes: 0.12 10*3/uL — ABNORMAL HIGH (ref 0.00–0.07)
Basophils Absolute: 0 10*3/uL (ref 0.0–0.1)
Basophils Relative: 0 %
Eosinophils Absolute: 0.2 10*3/uL (ref 0.0–0.5)
Eosinophils Relative: 2 %
HCT: 33.1 % — ABNORMAL LOW (ref 39.0–52.0)
Hemoglobin: 10.7 g/dL — ABNORMAL LOW (ref 13.0–17.0)
Immature Granulocytes: 1 %
Lymphocytes Relative: 13 %
Lymphs Abs: 1.4 10*3/uL (ref 0.7–4.0)
MCH: 30 pg (ref 26.0–34.0)
MCHC: 32.3 g/dL (ref 30.0–36.0)
MCV: 92.7 fL (ref 80.0–100.0)
Monocytes Absolute: 0.8 10*3/uL (ref 0.1–1.0)
Monocytes Relative: 8 %
Neutro Abs: 8.4 10*3/uL — ABNORMAL HIGH (ref 1.7–7.7)
Neutrophils Relative %: 76 %
Platelets: 469 10*3/uL — ABNORMAL HIGH (ref 150–400)
RBC: 3.57 MIL/uL — ABNORMAL LOW (ref 4.22–5.81)
RDW: 14.7 % (ref 11.5–15.5)
WBC: 11 10*3/uL — ABNORMAL HIGH (ref 4.0–10.5)
nRBC: 0 % (ref 0.0–0.2)

## 2022-11-29 LAB — COMPREHENSIVE METABOLIC PANEL
ALT: 14 U/L (ref 0–44)
AST: 16 U/L (ref 15–41)
Albumin: 1.7 g/dL — ABNORMAL LOW (ref 3.5–5.0)
Alkaline Phosphatase: 92 U/L (ref 38–126)
Anion gap: 8 (ref 5–15)
BUN: 9 mg/dL (ref 6–20)
CO2: 28 mmol/L (ref 22–32)
Calcium: 7.6 mg/dL — ABNORMAL LOW (ref 8.9–10.3)
Chloride: 97 mmol/L — ABNORMAL LOW (ref 98–111)
Creatinine, Ser: 0.86 mg/dL (ref 0.61–1.24)
GFR, Estimated: >60 mL/min (ref >60)
Glucose, Bld: 355 mg/dL — ABNORMAL HIGH (ref 70–99)
Potassium: 3.7 mmol/L (ref 3.5–5.1)
Sodium: 133 mmol/L — ABNORMAL LOW (ref 135–145)
Total Bilirubin: 0.4 mg/dL (ref 0.3–1.2)
Total Protein: 5.6 g/dL — ABNORMAL LOW (ref 6.5–8.1)

## 2022-11-29 LAB — GLUCOSE, CAPILLARY
Glucose-Capillary: 273 mg/dL — ABNORMAL HIGH (ref 70–99)
Glucose-Capillary: 172 mg/dL — ABNORMAL HIGH (ref 70–99)
Glucose-Capillary: 246 mg/dL — ABNORMAL HIGH (ref 70–99)
Glucose-Capillary: 167 mg/dL — ABNORMAL HIGH (ref 70–99)

## 2022-11-29 LAB — MAGNESIUM: Magnesium: 1.9 mg/dL (ref 1.7–2.4)

## 2022-11-29 LAB — VITAMIN B1: Vitamin B1 (Thiamine): 105.9 nmol/L (ref 66.5–200.0)

## 2022-11-29 MED ORDER — INSULIN ASPART 100 UNIT/ML IJ SOLN
6.0000 [IU] | Freq: Three times a day (TID) | INTRAMUSCULAR | Status: DC
  Administered 2022-11-29 – 2022-12-01 (×6): 6 [IU] via SUBCUTANEOUS

## 2022-11-29 MED ORDER — INSULIN GLARGINE-YFGN 100 UNIT/ML ~~LOC~~ SOLN
15.0000 [IU] | Freq: Two times a day (BID) | SUBCUTANEOUS | Status: DC
  Administered 2022-11-29 (×2): 15 [IU] via SUBCUTANEOUS
  Filled 2022-11-29 (×3): qty 0.15

## 2022-11-29 NOTE — Inpatient Diabetes Management (Signed)
Inpatient Diabetes Program Recommendations  AACE/ADA: New Consensus Statement on Inpatient Glycemic Control (2015)  Target Ranges:  Prepandial:   less than 140 mg/dL      Peak postprandial:   less than 180 mg/dL (1-2 hours)      Critically ill patients:  140 - 180 mg/dL   Lab Results  Component Value Date   GLUCAP 172 (H) 11/29/2022   HGBA1C 9.0 (H) 11/22/2022    Latest Reference Range & Units 11/28/22 06:25 11/28/22 10:41 11/28/22 16:35 11/28/22 21:59 11/29/22 06:17 11/29/22 11:21  Glucose-Capillary 70 - 99 mg/dL 161 (H) 096 (H) 045 (H) 324 (H) 273 (H) 172 (H)  (H): Data is abnormally high  Diabetes history: DM2 Outpatient Diabetes medications: Basaglar 15 units q hs, Glipizide 5 mg qd Current orders for Inpatient glycemic control: Semglee 15 units bid, Novolog 6 units tid meal coverage  Inpatient Diabetes Program Recommendations:   Patient was not able to remember what insulin he is taking, but called his wife and she gives the Basaglar 15 units q hs. Patient states he does not want to give insulin and does not want to check his CBG except once in a while. Reviewed basic nutrition with patient and he states he follows what he is supposed to do.  Spoke with pt about A1C results 9.1 (average blood glucose approximately 212 over the past 2-3 months) and explained what an A1C is, basic pathophysiology of DM Type 2, basic home care, basic diabetes diet nutrition principles, importance of checking CBGs and maintaining good CBG control to prevent long-term and short-term complications. Reviewed signs and symptoms of hyperglycemia and hypoglycemia and how to treat hypoglycemia at home. Also reviewed blood sugar goals at home.  RNs to provide ongoing basic DM education at bedside with this patient.   Thank you, Billy Fischer. Syris Brookens, RN, MSN, CDE  Diabetes Coordinator Inpatient Glycemic Control Team Team Pager [phone] (8am-5pm) 11/29/2022 12:54 PM

## 2022-11-29 NOTE — Progress Notes (Signed)
[facility] PROGRESS NOTE    Progress Note  Louis [name]  UJW:[id number] DOB: 1966-03-01 DOA: 11/21/2022 PCP: Bobette Mo, NP     Brief Narrative:   Louis [name] is an 57 y.o. male past medical history of essential hypertension, diabetes mellitus type 2 MRSA bacteremia with osteomyelitis, who had a recent COVID-19 infection 2 weeks prior to admission, got sick again 3 days prior to admission with shortness of breath chest pain cough and fever treated as an outpatient with antibiotics and steroids with no improvement found to have a large right pneumothorax and strep pneumo pneumonia   Assessment/Plan:   Staph aureus CAP (community acquired pneumonia): Pleural fluid grew Staph aureus MRSA sensitive to Doxy and clinda. Started empirically on IV vancomycin Repeat CT chest showed no evidence of loculation. Pulmonary was consulted recommended pain control continue current antibiotic 14-day course. Transition to Zyvox orally on 11/25/2022. Became confused and there was a concern etiology of her confusion was due to Zyvox switch back to IV vancomycin, and encephalopathy resolved now on oral Doxy for 2 weeks.  Tension pneumothorax status post chest tube: Dislodged on 11/23/2022. CT angio of the chest done on 11/28/2022 showed no PE small loculated effusion) was a concern there were associated locules probably due to chest tube or bronchopleural fistula.  And stable patchy left upper lobe groundglass opacities  Acute respiratory failure with hypoxia secondary to above: Oxygenation is improved she is having greater than 90% 2 L of oxygen. Out of bed to chair, continue incentive spirometry. Try to wean to room air. He was short of breath overnight CT angio of the chest showed no new findings. Currently satting greater 90% on 2 L. Continues inspirometry out of bed to chair physical therapy.  Recent COVID-19 infection: She has completed her isolation.  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  Diabetes mellitus type 2 uncontrolled with hyperglycemia: Increase long-acting insulin add meal coverage. Blood glucose elevated.  Essential hypertension: Blood pressure is controlled currently holding antihypertensive medications  Chest pain: Likely due to pneumonia tropes negative echo showed preserved EF CT angio negative for PE.  Acute metabolic encephalopathy: UDS negative on Lamictal. Likely due to Zyvox Zyvox discontinued encephalopathy resolved.    DVT prophylaxis: Lovenox Family Communication:none Status is: Inpatient Remains inpatient appropriate because: acute respiratory failure with hypoxia    Code Status:     Code Status Orders  (From admission, onward)           Start     Ordered   11/21/22 2059  Full code  Continuous       Question:  By:  Answer:  Default: patient does not have capacity for decision making, no surrogate or prior directive available   11/21/22 2101           Code Status History     Date Active Date Inactive Code Status Order ID Comments User Context   01/28/2021 1848 02/20/2021 1627 Full Code [id number]  Jerene Pitch Inpatient   01/28/2021 1848 01/28/2021 1848 Full Code [id number]  Jerene Pitch Inpatient   01/06/2021 0226 01/28/2021 1825 Full Code [id number]  Briscoe Deutscher, MD ED   12/12/2020 2253 12/26/2020 2008 Full Code [id number]  Synetta Fail, MD ED         IV Access:   Peripheral IV   Procedures and diagnostic studies:   CT Angio Chest Pulmonary Embolism (PE) W or WO Contrast  Result Date:  11/28/2022 CLINICAL DATA:  Shortness of breath EXAM: CT ANGIOGRAPHY CHEST WITH CONTRAST TECHNIQUE: Multidetector CT imaging of the chest was performed using the standard protocol during bolus administration of intravenous contrast. Multiplanar CT image reconstructions and MIPs were obtained to evaluate the vascular anatomy. RADIATION DOSE REDUCTION: This exam  was performed according to the departmental dose-optimization program which includes automated exposure control, adjustment of the mA and/or kV according to patient size and/or use of iterative reconstruction technique. CONTRAST:  50mL OMNIPAQUE IOHEXOL 350 MG/ML SOLN COMPARISON:  Chest CT dated November 25, 2022 FINDINGS: Cardiovascular: No evidence of pulmonary embolus. Normal heart size. No pericardial effusion. Caliber thoracic aorta with mild calcified plaque. 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 Mediastinum/Nodes: Small hiatal hernia. Esophagus and thyroid are unremarkable. Enlarged mediastinal and right hilar lymph nodes, unchanged when compared with the prior exam and likely reactive. Lungs/Pleura: Central airways are patent. Interval decreased similar patchy left upper lobe ground-glass opacities. Interval decreased central predominant consolidations of the right hemithorax, likely resolving atelectasis. Stable small loculated right pleural effusion associated locules of gas. No evidence of pneumothorax. Upper Abdomen: No acute abnormality. Musculoskeletal: Interval decreased right chest wall subcutaneous emphysema. Prior median sternotomy with intact sternal wires. Advanced degenerative changes at T6-T7 with osseous fusion. No aggressive appearing osseous lesions. Review of the MIP images confirms the above findings. IMPRESSION: 1. No evidence of pulmonary embolus. 2. Stable small loculated right pleural effusion, empyema is a concern. Associated locules of gas may be due to prior chest tube or bronchopleural fistula. 3. Similar patchy left upper lobe ground-glass opacities, likely infectious or inflammatory. 4. Interval decreased central predominant consolidations of the right hemithorax, likely resolving atelectasis. 5. Stable enlarged mediastinal and right hilar lymph nodes, likely reactive. 6. Aortic Atherosclerosis (ICD10-I70.0). Electronically Signed   By: Allegra Lai M.D.   On:  11/28/2022 10:08   DG CHEST PORT 1 VIEW  Result Date: 11/28/2022 CLINICAL DATA:  Shortness of breath EXAM: PORTABLE CHEST 1 VIEW COMPARISON:  11/24/2018 FINDINGS: Cardiac shadow is stable. Postsurgical changes are noted. Chronic blunting of the right costophrenic angle is seen. Persistent airspace disease is noted within the right lung. Left lung remains clear. IMPRESSION: Stable appearance of the chest when compared with the prior exam. Electronically Signed   By: Alcide Clever M.D.   On: 11/28/2022 03:50     Medical Consultants:   None.   Subjective:    Louis [name] relates he feels short of breath.  Objective:    Vitals:   11/28/22 2004 11/28/22 2313 11/29/22 0336 11/29/22 0441  BP:  98/60 101/69   Pulse:      Resp:  18 20   Temp:  98.2 F (36.8 C) 98.4 F (36.9 C)   TempSrc:  Oral Oral   SpO2: 95% 96% 98%   Weight:    91.8 kg  Height:       SpO2: 98 % O2 Flow Rate (L/min): 2 L/min   Intake/Output Summary (Last 24 hours) at 11/29/2022 0729 Last data filed at 11/29/2022 0630 Gross per 24 hour  Intake 714 ml  Output 500 ml  Net 214 ml   Filed Weights   11/21/22 1450 11/21/22 2238 11/29/22 0441  Weight: 85.7 kg 86.8 kg 91.8 kg    Exam: General exam: In no acute distress. Respiratory system: Good air movement and clear to auscultation. Cardiovascular system: S1 & S2 heard, RRR. No JVD. Gastrointestinal system: Abdomen is nondistended, soft and nontender.  Extremities: No pedal edema. Skin: No  rashes, lesions or ulcers Psychiatry: Judgement and insight appear normal. Mood & affect appropriate.    Data Reviewed:    Labs: Basic Metabolic Panel: Recent Labs  Lab 11/25/22 0006 11/26/22 1040 11/27/22 0027 11/28/22 0058 11/29/22 0041  NA 132* 131* 137 135 133*  K 4.2 4.5 3.5 3.7 3.7  CL 94* 96* 99 100 97*  CO2 28 27 28 27 28   GLUCOSE 274* 273* 100* 214* 355*  BUN 14 13 9 10 9   CREATININE 0.99 0.90 0.68 0.93 0.86  CALCIUM 8.2* 7.7* 7.7* 7.8* 7.6*  MG 2.1  2.1 1.9 2.0 1.9   GFR Estimated Creatinine Clearance: 107.1 mL/min (by C-G formula based on SCr of 0.86 mg/dL). Liver Function Tests: Recent Labs  Lab 11/26/22 1040 11/27/22 0027 11/28/22 0058 11/29/22 0041  AST 16 13* 14* 16  ALT 11 13 13 14   ALKPHOS 115 97 95 92  BILITOT 0.7 0.9 0.9 0.4  PROT 5.7* 5.3* 5.7* 5.6*  ALBUMIN 2.0* 1.9* 1.8* 1.7*   No results for input(s): "LIPASE", "AMYLASE" in the last 168 hours. Recent Labs  Lab 11/26/22 1040  AMMONIA 24   Coagulation profile No results for input(s): "INR", "PROTIME" in the last 168 hours. COVID-19 Labs  Recent Labs    11/28/22 0419  DDIMER 9.29*    Lab Results  Component Value Date   SARSCOV2NAA POSITIVE (A) 11/21/2022   SARSCOV2NAA NEGATIVE 01/05/2021   SARSCOV2NAA NEGATIVE 12/12/2020    CBC: Recent Labs  Lab 11/25/22 0006 11/26/22 1040 11/27/22 0027 11/28/22 0058 11/29/22 0041  WBC 16.6* 15.2* 16.2* 14.1* 11.0*  NEUTROABS  --  12.6* 13.3* 11.1* 8.4*  HGB 12.1* 11.8* 11.3* 11.4* 10.7*  HCT 36.2* 36.2* 34.1* 34.2* 33.1*  MCV 93.3 93.1 91.2 92.7 92.7  PLT 314 356 387 429* 469*   Cardiac Enzymes: No results for input(s): "CKTOTAL", "CKMB", "CKMBINDEX", "TROPONINI" in the last 168 hours. BNP (last 3 results) No results for input(s): "PROBNP" in the last 8760 hours. CBG: Recent Labs  Lab 11/28/22 0625 11/28/22 1041 11/28/22 1635 11/28/22 2159 11/29/22 0617  GLUCAP 165* 138* 199* 324* 273*   D-Dimer: Recent Labs    11/28/22 0419  DDIMER 9.29*   Hgb A1c: No results for input(s): "HGBA1C" in the last 72 hours. Lipid Profile: No results for input(s): "CHOL", "HDL", "LDLCALC", "TRIG", "CHOLHDL", "LDLDIRECT" in the last 72 hours. Thyroid function studies: Recent Labs    11/26/22 1040  TSH 2.540   Anemia work up: Recent Labs    11/26/22 1040  VITAMINB12 668   Sepsis Labs: Recent Labs  Lab 11/26/22 1040 11/27/22 0027 11/28/22 0058 11/29/22 0041  WBC 15.2* 16.2* 14.1* 11.0*    Microbiology Recent Results (from the past 240 hour(s))  Blood Culture (routine x 2)     Status: None   Collection Time: 11/21/22  3:45 PM   Specimen: BLOOD  Result Value Ref Range Status   Specimen Description BLOOD SITE NOT SPECIFIED  Final   Special Requests   Final    BOTTLES DRAWN AEROBIC AND ANAEROBIC Blood Culture adequate volume   Culture   Final    NO GROWTH 5 DAYS Performed at [facility], [address] [place], [place] [address]    Report Status 11/26/2022 FINAL  Final  Resp panel by RT-PCR (RSV, Flu A&B, Covid) Anterior Nasal Swab     Status: Abnormal   Collection Time: 11/21/22  3:51 PM   Specimen: Anterior Nasal Swab  Result Value Ref Range Status  SARS Coronavirus 2 by RT PCR POSITIVE (A) NEGATIVE Final   Influenza A by PCR NEGATIVE NEGATIVE Final   Influenza B by PCR NEGATIVE NEGATIVE Final    Comment: (NOTE) The Xpert Xpress SARS-CoV-2/FLU/RSV plus assay is intended as an aid in the diagnosis of influenza from Nasopharyngeal swab specimens and should not be used as a sole basis for treatment. Nasal washings and aspirates are unacceptable for Xpert Xpress SARS-CoV-2/FLU/RSV testing.  Fact Sheet for Patients: BloggerCourse.com  Fact Sheet for Healthcare Providers: SeriousBroker.it  This test is not yet approved or cleared by the [place] FDA and has been authorized for detection and/or diagnosis of SARS-CoV-2 by FDA under an Emergency Use Authorization (EUA). This EUA will remain in effect (meaning this test can be used) for the duration of the COVID-19 declaration under Section 564(b)(1) of the Act, 21 U.S.C. section 360bbb-3(b)(1), unless the authorization is terminated or revoked.     Resp Syncytial Virus by PCR NEGATIVE NEGATIVE Final    Comment: (NOTE) Fact Sheet for Patients: BloggerCourse.com  Fact Sheet for Healthcare  Providers: SeriousBroker.it  This test is not yet approved or cleared by the [place] FDA and has been authorized for detection and/or diagnosis of SARS-CoV-2 by FDA under an Emergency Use Authorization (EUA). This EUA will remain in effect (meaning this test can be used) for the duration of the COVID-19 declaration under Section 564(b)(1) of the Act, 21 U.S.C. section 360bbb-3(b)(1), unless the authorization is terminated or revoked.  Performed at [facility], [address] [place], [place] [address]   Body fluid culture w Gram Stain     Status: None   Collection Time: 11/21/22  9:32 PM   Specimen: Pleural Fluid  Result Value Ref Range Status   Specimen Description PLEURAL  Final   Special Requests NONE  Final   Gram Stain   Final    MODERATE WBC PRESENT, PREDOMINANTLY PMN NO ORGANISMS SEEN    Culture   Final    ABUNDANT METHICILLIN RESISTANT STAPHYLOCOCCUS AUREUS CRITICAL RESULT CALLED TO, READ BACK BY AND VERIFIED WITH: RN W.OWEN AT 1030 ON 11/22/2022 BY T.SAAD. Performed at [facility], [address] [place], [place] [address]    Report Status 11/24/2022 FINAL  Final   Organism ID, Bacteria METHICILLIN RESISTANT STAPHYLOCOCCUS AUREUS  Final      Susceptibility   Methicillin resistant staphylococcus aureus - MIC*    CIPROFLOXACIN >=8 RESISTANT Resistant     ERYTHROMYCIN >=8 RESISTANT Resistant     GENTAMICIN <=0.5 SENSITIVE Sensitive     OXACILLIN >=4 RESISTANT Resistant     TETRACYCLINE <=1 SENSITIVE Sensitive     VANCOMYCIN <=0.5 SENSITIVE Sensitive     TRIMETH/SULFA >=320 RESISTANT Resistant     CLINDAMYCIN <=0.25 SENSITIVE Sensitive     RIFAMPIN <=0.5 SENSITIVE Sensitive     Inducible Clindamycin NEGATIVE Sensitive     LINEZOLID 2 SENSITIVE Sensitive     * ABUNDANT METHICILLIN RESISTANT STAPHYLOCOCCUS AUREUS  MRSA Next Gen by PCR, Nasal     Status: Abnormal   Collection Time: 11/21/22 10:00 PM   Specimen: Nasal  Mucosa; Nasal Swab  Result Value Ref Range Status   MRSA by PCR Next Gen DETECTED (A) NOT DETECTED Final    Comment: RESULT CALLED TO, READ BACK BY AND VERIFIED WITH: L NITURIDA,RN@0012  11/22/22 MK (NOTE) The GeneXpert MRSA Assay (FDA approved for NASAL specimens only), is one component of a comprehensive MRSA colonization surveillance program. It is not intended to diagnose  MRSA infection nor to guide or monitor treatment for MRSA infections. Test performance is not FDA approved in patients less than 33 years old. Performed at [facility], [address], [place], [place] [address]   Blood Culture (routine x 2)     Status: None   Collection Time: 11/22/22 12:27 AM   Specimen: BLOOD LEFT ARM  Result Value Ref Range Status   Specimen Description BLOOD LEFT ARM  Final   Special Requests   Final    BOTTLES DRAWN AEROBIC AND ANAEROBIC Blood Culture results may not be optimal due to an inadequate volume of blood received in culture bottles   Culture   Final    NO GROWTH 5 DAYS Performed at [facility], [address], [place], [place] [address]    Report Status 11/27/2022 FINAL  Final     Medications:    acetaminophen  650 mg Oral Q6H   aspirin EC  81 mg Oral Daily   atorvastatin  40 mg Oral Daily   benzonatate  100 mg Oral TID   docusate sodium  100 mg Oral Daily   doxycycline  100 mg Oral Q12H   enoxaparin (LOVENOX) injection  40 mg Subcutaneous Q24H   folic acid  1 mg Oral Daily   gabapentin  400 mg Oral TID   guaiFENesin  600 mg Oral BID   insulin aspart  0-15 Units Subcutaneous TID WC   insulin aspart  0-5 Units Subcutaneous QHS   insulin glargine-yfgn  15 Units Subcutaneous Daily   lactulose  30 g Oral BID   lamoTRIgine  25 mg Oral BID   lidocaine  1 patch Transdermal Q24H   melatonin  10 mg Oral QHS   naproxen  500 mg Oral TID WC   pantoprazole  80 mg Oral Daily   Continuous Infusions:    LOS: 8 days   Marinda Elk  [facility]  11/29/2022, 7:29 AM

## 2022-11-30 DIAGNOSIS — J9601 Acute respiratory failure with hypoxia: Secondary | ICD-10-CM | POA: Diagnosis not present

## 2022-11-30 DIAGNOSIS — J9383 Other pneumothorax: Secondary | ICD-10-CM | POA: Diagnosis not present

## 2022-11-30 DIAGNOSIS — J189 Pneumonia, unspecified organism: Secondary | ICD-10-CM | POA: Diagnosis not present

## 2022-11-30 DIAGNOSIS — E222 Syndrome of inappropriate secretion of antidiuretic hormone: Secondary | ICD-10-CM | POA: Diagnosis not present

## 2022-11-30 LAB — GLUCOSE, CAPILLARY
Glucose-Capillary: 135 mg/dL — ABNORMAL HIGH (ref 70–99)
Glucose-Capillary: 131 mg/dL — ABNORMAL HIGH (ref 70–99)
Glucose-Capillary: 148 mg/dL — ABNORMAL HIGH (ref 70–99)
Glucose-Capillary: 119 mg/dL — ABNORMAL HIGH (ref 70–99)
Glucose-Capillary: 213 mg/dL — ABNORMAL HIGH (ref 70–99)

## 2022-11-30 MED ORDER — INSULIN GLARGINE-YFGN 100 UNIT/ML ~~LOC~~ SOLN
20.0000 [IU] | Freq: Two times a day (BID) | SUBCUTANEOUS | Status: DC
  Administered 2022-11-30 – 2022-12-01 (×3): 20 [IU] via SUBCUTANEOUS
  Filled 2022-11-30 (×5): qty 0.2

## 2022-11-30 NOTE — Progress Notes (Signed)
[facility] PROGRESS NOTE    Progress Note  Louis [name]  ZOX:[id number] DOB: 1965/12/22 DOA: 11/21/2022 PCP: Bobette Mo, NP     Brief Narrative:   Louis [name] is an 57 y.o. male past medical history of essential hypertension, diabetes mellitus type 2 MRSA bacteremia with osteomyelitis, who had a recent COVID-19 infection 2 weeks prior to admission, got sick again 3 days prior to admission with shortness of breath chest pain cough and fever treated as an outpatient with antibiotics and steroids with no improvement found to have a large right pneumothorax and strep pneumo pneumonia   Assessment/Plan:   Staph aureus CAP (community acquired pneumonia): Pleural fluid grew Staph aureus MRSA sensitive to Doxy and clinda. Started empirically on IV vancomycin Repeat CT chest showed no evidence of loculation. Pulmonary was consulted recommended pain control continue current antibiotic 14-day course. Transition to Zyvox orally on 11/25/2022. Became confused and there was a concern etiology of her confusion was due to Zyvox switch back to IV vancomycin, and encephalopathy resolved now on oral Doxy for 2 weeks. Will monitor for 24 hours to see if he remains afebrile no further pulmonary events.  Tension pneumothorax status post chest tube: Dislodged on 11/23/2022. CT angio of the chest done on 11/28/2022 showed no PE (small loculated effusion) was a concern there were associated locules probably due to chest tube or bronchopleural fistula.  And stable patchy left upper lobe groundglass opacities Chest tube has been removed.  Acute respiratory failure with hypoxia secondary to above: Oxygenation is improved she is having greater than 90% 2 L of oxygen. Out of bed to chair, continue incentive spirometry. He was short of breath overnight CT angio of the chest showed no new findings. Continues inspirometry. He has been weaned to room air.  He relates his breathing is better but if he has episodes  that he feels he cannot breathe, question if he is having panic attacks. Monitor for 24 hours if he is saturations remained stable. Will need to follow-up with pulmonary as an outpatient.  Recent COVID-19 infection: She has completed her isolation.  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  Diabetes mellitus type 2 uncontrolled with hyperglycemia: Increase long-acting insulin Blood glucose still elevated.  Essential hypertension: Blood pressure is up this morning continue to monitor might need to restart his home regimen slowly.  Chest pain: Likely due to pneumonia tropes negative echo showed preserved EF CT angio negative for PE.  Acute metabolic encephalopathy: UDS negative on Lamictal. Likely due to  Zyvox discontinued encephalopathy resolved.    DVT prophylaxis: Lovenox Family Communication:none Status is: Inpatient Remains inpatient appropriate because: acute respiratory failure with hypoxia    Code Status:     Code Status Orders  (From admission, onward)           Start     Ordered   11/21/22 2059  Full code  Continuous       Question:  By:  Answer:  Default: patient does not have capacity for decision making, no surrogate or prior directive available   11/21/22 2101           Code Status History     Date Active Date Inactive Code Status Order ID Comments User Context   01/28/2021 1848 02/20/2021 1627 Full Code [id number]  Jerene Pitch Inpatient   01/28/2021 1848 01/28/2021 1848 Full Code [id number]  Jerene Pitch Inpatient   01/06/2021 0226 01/28/2021 1825 Full  Code [id number]  Briscoe Deutscher, MD ED   12/12/2020 2253 12/26/2020 2008 Full Code [id number]  Synetta Fail, MD ED         IV Access:   Peripheral IV   Procedures and diagnostic studies:   CT Angio Chest Pulmonary Embolism (PE) W or WO Contrast  Result Date: 11/28/2022 CLINICAL DATA:  Shortness of breath EXAM: CT  ANGIOGRAPHY CHEST WITH CONTRAST TECHNIQUE: Multidetector CT imaging of the chest was performed using the standard protocol during bolus administration of intravenous contrast. Multiplanar CT image reconstructions and MIPs were obtained to evaluate the vascular anatomy. RADIATION DOSE REDUCTION: This exam was performed according to the departmental dose-optimization program which includes automated exposure control, adjustment of the mA and/or kV according to patient size and/or use of iterative reconstruction technique. CONTRAST:  50mL OMNIPAQUE IOHEXOL 350 MG/ML SOLN COMPARISON:  Chest CT dated November 25, 2022 FINDINGS: Cardiovascular: No evidence of pulmonary embolus. Normal heart size. No pericardial effusion. Caliber thoracic aorta with mild calcified plaque. 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 Mediastinum/Nodes: Small hiatal hernia. Esophagus and thyroid are unremarkable. Enlarged mediastinal and right hilar lymph nodes, unchanged when compared with the prior exam and likely reactive. Lungs/Pleura: Central airways are patent. Interval decreased similar patchy left upper lobe ground-glass opacities. Interval decreased central predominant consolidations of the right hemithorax, likely resolving atelectasis. Stable small loculated right pleural effusion associated locules of gas. No evidence of pneumothorax. Upper Abdomen: No acute abnormality. Musculoskeletal: Interval decreased right chest wall subcutaneous emphysema. Prior median sternotomy with intact sternal wires. Advanced degenerative changes at T6-T7 with osseous fusion. No aggressive appearing osseous lesions. Review of the MIP images confirms the above findings. IMPRESSION: 1. No evidence of pulmonary embolus. 2. Stable small loculated right pleural effusion, empyema is a concern. Associated locules of gas may be due to prior chest tube or bronchopleural fistula. 3. Similar patchy left upper lobe ground-glass opacities, likely infectious  or inflammatory. 4. Interval decreased central predominant consolidations of the right hemithorax, likely resolving atelectasis. 5. Stable enlarged mediastinal and right hilar lymph nodes, likely reactive. 6. Aortic Atherosclerosis (ICD10-I70.0). Electronically Signed   By: Allegra Lai M.D.   On: 11/28/2022 10:08     Medical Consultants:   None.   Subjective:    Louis [name] his shortness of breath is better but that he continues to have episodes that he feels that he cannot breathe although his vitals remained stable.  Objective:    Vitals:   11/29/22 1930 11/29/22 2332 11/30/22 0500 11/30/22 0729  BP: 123/69 137/84 134/89 (!) 151/85  Pulse: 75 78 87 79  Resp: 20     Temp: 98.3 F (36.8 C) 98.3 F (36.8 C) 98.3 F (36.8 C) 98.1 F (36.7 C)  TempSrc: Oral Oral Oral Oral  SpO2: 92% 98% 91%   Weight:      Height:       SpO2: 91 % O2 Flow Rate (L/min): 1 L/min   Intake/Output Summary (Last 24 hours) at 11/30/2022 0735 Last data filed at 11/30/2022 0732 Gross per 24 hour  Intake 960 ml  Output --  Net 960 ml    Filed Weights   11/21/22 1450 11/21/22 2238 11/29/22 0441  Weight: 85.7 kg 86.8 kg 91.8 kg    Exam: General exam: In no acute distress. Respiratory system: Good air movement and clear to auscultation. Cardiovascular system: S1 & S2 heard, RRR. No JVD. Gastrointestinal system: Abdomen is nondistended, soft and nontender.  Extremities: No pedal edema. Skin:  No rashes, lesions or ulcers Psychiatry: Judgement and insight appear normal. Mood & affect appropriate.  Data Reviewed:    Labs: Basic Metabolic Panel: Recent Labs  Lab 11/25/22 0006 11/26/22 1040 11/27/22 0027 11/28/22 0058 11/29/22 0041  NA 132* 131* 137 135 133*  K 4.2 4.5 3.5 3.7 3.7  CL 94* 96* 99 100 97*  CO2 28 27 28 27 28   GLUCOSE 274* 273* 100* 214* 355*  BUN 14 13 9 10 9   CREATININE 0.99 0.90 0.68 0.93 0.86  CALCIUM 8.2* 7.7* 7.7* 7.8* 7.6*  MG 2.1 2.1 1.9 2.0 1.9     GFR Estimated Creatinine Clearance: 107.1 mL/min (by C-G formula based on SCr of 0.86 mg/dL). Liver Function Tests: Recent Labs  Lab 11/26/22 1040 11/27/22 0027 11/28/22 0058 11/29/22 0041  AST 16 13* 14* 16  ALT 11 13 13 14   ALKPHOS 115 97 95 92  BILITOT 0.7 0.9 0.9 0.4  PROT 5.7* 5.3* 5.7* 5.6*  ALBUMIN 2.0* 1.9* 1.8* 1.7*    No results for input(s): "LIPASE", "AMYLASE" in the last 168 hours. Recent Labs  Lab 11/26/22 1040  AMMONIA 24    Coagulation profile No results for input(s): "INR", "PROTIME" in the last 168 hours. COVID-19 Labs  Recent Labs    11/28/22 0419  DDIMER 9.29*     Lab Results  Component Value Date   SARSCOV2NAA POSITIVE (A) 11/21/2022   SARSCOV2NAA NEGATIVE 01/05/2021   SARSCOV2NAA NEGATIVE 12/12/2020    CBC: Recent Labs  Lab 11/25/22 0006 11/26/22 1040 11/27/22 0027 11/28/22 0058 11/29/22 0041  WBC 16.6* 15.2* 16.2* 14.1* 11.0*  NEUTROABS  --  12.6* 13.3* 11.1* 8.4*  HGB 12.1* 11.8* 11.3* 11.4* 10.7*  HCT 36.2* 36.2* 34.1* 34.2* 33.1*  MCV 93.3 93.1 91.2 92.7 92.7  PLT 314 356 387 429* 469*    Cardiac Enzymes: No results for input(s): "CKTOTAL", "CKMB", "CKMBINDEX", "TROPONINI" in the last 168 hours. BNP (last 3 results) No results for input(s): "PROBNP" in the last 8760 hours. CBG: Recent Labs  Lab 11/29/22 0617 11/29/22 1121 11/29/22 1540 11/29/22 2123 11/30/22 0614  GLUCAP 273* 172* 246* 167* 135*    D-Dimer: Recent Labs    11/28/22 0419  DDIMER 9.29*    Hgb A1c: No results for input(s): "HGBA1C" in the last 72 hours. Lipid Profile: No results for input(s): "CHOL", "HDL", "LDLCALC", "TRIG", "CHOLHDL", "LDLDIRECT" in the last 72 hours. Thyroid function studies: No results for input(s): "TSH", "T4TOTAL", "T3FREE", "THYROIDAB" in the last 72 hours.  Invalid input(s): "FREET3"  Anemia work up: No results for input(s): "VITAMINB12", "FOLATE", "FERRITIN", "TIBC", "IRON", "RETICCTPCT" in the last 72  hours.  Sepsis Labs: Recent Labs  Lab 11/26/22 1040 11/27/22 0027 11/28/22 0058 11/29/22 0041  WBC 15.2* 16.2* 14.1* 11.0*    Microbiology Recent Results (from the past 240 hour(s))  Blood Culture (routine x 2)     Status: None   Collection Time: 11/21/22  3:45 PM   Specimen: BLOOD  Result Value Ref Range Status   Specimen Description BLOOD SITE NOT SPECIFIED  Final   Special Requests   Final    BOTTLES DRAWN AEROBIC AND ANAEROBIC Blood Culture adequate volume   Culture   Final    NO GROWTH 5 DAYS Performed at [facility], [address], [place], [place] [address]    Report Status 11/26/2022 FINAL  Final  Resp panel by RT-PCR (RSV, Flu A&B, Covid) Anterior Nasal Swab     Status: Abnormal   Collection Time:  11/21/22  3:51 PM   Specimen: Anterior Nasal Swab  Result Value Ref Range Status   SARS Coronavirus 2 by RT PCR POSITIVE (A) NEGATIVE Final   Influenza A by PCR NEGATIVE NEGATIVE Final   Influenza B by PCR NEGATIVE NEGATIVE Final    Comment: (NOTE) The Xpert Xpress SARS-CoV-2/FLU/RSV plus assay is intended as an aid in the diagnosis of influenza from Nasopharyngeal swab specimens and should not be used as a sole basis for treatment. Nasal washings and aspirates are unacceptable for Xpert Xpress SARS-CoV-2/FLU/RSV testing.  Fact Sheet for Patients: BloggerCourse.com  Fact Sheet for Healthcare Providers: SeriousBroker.it  This test is not yet approved or cleared by the [place] FDA and has been authorized for detection and/or diagnosis of SARS-CoV-2 by FDA under an Emergency Use Authorization (EUA). This EUA will remain in effect (meaning this test can be used) for the duration of the COVID-19 declaration under Section 564(b)(1) of the Act, 21 U.S.C. section 360bbb-3(b)(1), unless the authorization is terminated or revoked.     Resp Syncytial Virus by PCR NEGATIVE NEGATIVE Final    Comment:  (NOTE) Fact Sheet for Patients: BloggerCourse.com  Fact Sheet for Healthcare Providers: SeriousBroker.it  This test is not yet approved or cleared by the [place] FDA and has been authorized for detection and/or diagnosis of SARS-CoV-2 by FDA under an Emergency Use Authorization (EUA). This EUA will remain in effect (meaning this test can be used) for the duration of the COVID-19 declaration under Section 564(b)(1) of the Act, 21 U.S.C. section 360bbb-3(b)(1), unless the authorization is terminated or revoked.  Performed at [facility], [address] [place], [place] [address]   Body fluid culture w Gram Stain     Status: None   Collection Time: 11/21/22  9:32 PM   Specimen: Pleural Fluid  Result Value Ref Range Status   Specimen Description PLEURAL  Final   Special Requests NONE  Final   Gram Stain   Final    MODERATE WBC PRESENT, PREDOMINANTLY PMN NO ORGANISMS SEEN    Culture   Final    ABUNDANT METHICILLIN RESISTANT STAPHYLOCOCCUS AUREUS CRITICAL RESULT CALLED TO, READ BACK BY AND VERIFIED WITH: RN W.OWEN AT 1030 ON 11/22/2022 BY T.SAAD. Performed at [facility], [address] [place], [place] [address]    Report Status 11/24/2022 FINAL  Final   Organism ID, Bacteria METHICILLIN RESISTANT STAPHYLOCOCCUS AUREUS  Final      Susceptibility   Methicillin resistant staphylococcus aureus - MIC*    CIPROFLOXACIN >=8 RESISTANT Resistant     ERYTHROMYCIN >=8 RESISTANT Resistant     GENTAMICIN <=0.5 SENSITIVE Sensitive     OXACILLIN >=4 RESISTANT Resistant     TETRACYCLINE <=1 SENSITIVE Sensitive     VANCOMYCIN <=0.5 SENSITIVE Sensitive     TRIMETH/SULFA >=320 RESISTANT Resistant     CLINDAMYCIN <=0.25 SENSITIVE Sensitive     RIFAMPIN <=0.5 SENSITIVE Sensitive     Inducible Clindamycin NEGATIVE Sensitive     LINEZOLID 2 SENSITIVE Sensitive     * ABUNDANT METHICILLIN RESISTANT STAPHYLOCOCCUS AUREUS  MRSA  Next Gen by PCR, Nasal     Status: Abnormal   Collection Time: 11/21/22 10:00 PM   Specimen: Nasal Mucosa; Nasal Swab  Result Value Ref Range Status   MRSA by PCR Next Gen DETECTED (A) NOT DETECTED Final    Comment: RESULT CALLED TO, READ BACK BY AND VERIFIED WITH: L NITURIDA,RN@0012  11/22/22 MK (NOTE) The GeneXpert MRSA Assay (FDA approved for NASAL  specimens only), is one component of a comprehensive MRSA colonization surveillance program. It is not intended to diagnose MRSA infection nor to guide or monitor treatment for MRSA infections. Test performance is not FDA approved in patients less than 42 years old. Performed at [facility], [address], [place], [place] [address]   Blood Culture (routine x 2)     Status: None   Collection Time: 11/22/22 12:27 AM   Specimen: BLOOD LEFT ARM  Result Value Ref Range Status   Specimen Description BLOOD LEFT ARM  Final   Special Requests   Final    BOTTLES DRAWN AEROBIC AND ANAEROBIC Blood Culture results may not be optimal due to an inadequate volume of blood received in culture bottles   Culture   Final    NO GROWTH 5 DAYS Performed at [facility], [address] [place], [place] [address]    Report Status 11/27/2022 FINAL  Final     Medications:    acetaminophen  650 mg Oral Q6H   aspirin EC  81 mg Oral Daily   atorvastatin  40 mg Oral Daily   benzonatate  100 mg Oral TID   docusate sodium  100 mg Oral Daily   doxycycline  100 mg Oral Q12H   enoxaparin (LOVENOX) injection  40 mg Subcutaneous Q24H   folic acid  1 mg Oral Daily   gabapentin  400 mg Oral TID   guaiFENesin  600 mg Oral BID   insulin aspart  0-15 Units Subcutaneous TID WC   insulin aspart  0-5 Units Subcutaneous QHS   insulin aspart  6 Units Subcutaneous TID WC   insulin glargine-yfgn  15 Units Subcutaneous BID   lactulose  30 g Oral BID   lamoTRIgine  25 mg Oral BID   lidocaine  1 patch Transdermal Q24H   melatonin  10 mg Oral QHS   naproxen   500 mg Oral TID WC   pantoprazole  80 mg Oral Daily   Continuous Infusions:    LOS: 9 days   Marinda Elk  [facility]  11/30/2022, 7:35 AM

## 2022-11-30 NOTE — Progress Notes (Signed)
Patient arrives to 2W35 at this time

## 2022-11-30 NOTE — Progress Notes (Signed)
Mobility Specialist Progress Note:    11/30/22 1538  Mobility  Activity Ambulated independently in hallway  Level of Assistance Modified independent, requires aide device or extra time  Assistive Device None  Distance Ambulated (ft) 500 ft  Activity Response Tolerated well  Mobility Referral Yes  $Mobility charge 1 Mobility  Mobility Specialist Start Time (ACUTE ONLY) 1423  Mobility Specialist Stop Time (ACUTE ONLY) 1428  Mobility Specialist Time Calculation (min) (ACUTE ONLY) 5 min   Pt received in bed, agreeable to ambulate. Pt had no c/o throughout session. Pt encouraged to ambulate in hallways independently. Pt returned to bed w/ call bell in hand, all needs met.  Thompson Grayer Mobility Specialist  Please contact vis Secure Chat or  Rehab Office [phone]

## 2022-12-01 ENCOUNTER — Other Ambulatory Visit (HOSPITAL_COMMUNITY): Payer: Self-pay

## 2022-12-01 DIAGNOSIS — J189 Pneumonia, unspecified organism: Secondary | ICD-10-CM | POA: Diagnosis not present

## 2022-12-01 LAB — CBC
HCT: 33.5 % — ABNORMAL LOW (ref 39.0–52.0)
Hemoglobin: 11 g/dL — ABNORMAL LOW (ref 13.0–17.0)
MCH: 30 pg (ref 26.0–34.0)
MCHC: 32.8 g/dL (ref 30.0–36.0)
MCV: 91.3 fL (ref 80.0–100.0)
Platelets: 527 10*3/uL — ABNORMAL HIGH (ref 150–400)
RBC: 3.67 MIL/uL — ABNORMAL LOW (ref 4.22–5.81)
RDW: 14.3 % (ref 11.5–15.5)
WBC: 10.8 10*3/uL — ABNORMAL HIGH (ref 4.0–10.5)
nRBC: 0 % (ref 0.0–0.2)

## 2022-12-01 LAB — BASIC METABOLIC PANEL
Anion gap: 7 (ref 5–15)
BUN: <5 mg/dL — ABNORMAL LOW (ref 6–20)
CO2: 28 mmol/L (ref 22–32)
Calcium: 8.2 mg/dL — ABNORMAL LOW (ref 8.9–10.3)
Chloride: 99 mmol/L (ref 98–111)
Creatinine, Ser: 0.89 mg/dL (ref 0.61–1.24)
GFR, Estimated: >60 mL/min (ref >60)
Glucose, Bld: 121 mg/dL — ABNORMAL HIGH (ref 70–99)
Potassium: 4.1 mmol/L (ref 3.5–5.1)
Sodium: 134 mmol/L — ABNORMAL LOW (ref 135–145)

## 2022-12-01 LAB — GLUCOSE, CAPILLARY
Glucose-Capillary: 286 mg/dL — ABNORMAL HIGH (ref 70–99)
Glucose-Capillary: 124 mg/dL — ABNORMAL HIGH (ref 70–99)
Glucose-Capillary: 116 mg/dL — ABNORMAL HIGH (ref 70–99)

## 2022-12-01 MED ORDER — BENZONATATE 100 MG PO CAPS
100.0000 mg | ORAL_CAPSULE | Freq: Three times a day (TID) | ORAL | 0 refills | Status: AC
  Filled 2022-12-01: qty 20, 7d supply, fill #0

## 2022-12-01 MED ORDER — GUAIFENESIN ER 600 MG PO TB12: 600.0000 mg | ORAL_TABLET | Freq: Two times a day (BID) | ORAL | Status: AC

## 2022-12-01 MED ORDER — DOXYCYCLINE HYCLATE 100 MG PO TABS
100.0000 mg | ORAL_TABLET | Freq: Two times a day (BID) | ORAL | 0 refills | Status: AC
  Filled 2022-12-01: qty 8, 4d supply, fill #0

## 2022-12-01 NOTE — Discharge Summary (Signed)
Physician Discharge Summary  Louis [name] ZOX:[id number] DOB: 05/05/1966 DOA: 11/21/2022  PCP: Bobette Mo, NP  Admit date: 11/21/2022 Discharge date: 12/01/2022  Admitted From: Home Disposition:  Home  Recommendations for Outpatient Follow-up:  Follow up with PCP in 1-2 weeks Please obtain CBC in 1 week Continue doxycycline for 4 more days Continue Tessalon Perles as well as Mucinex as needed for cough Follow-up with pulmonology outpatient Continue glipizide and home insulin dose and close follow-up with PCP for uncontrolled diabetes  Home Health: None Equipment/Devices: None Discharge Condition: Stable CODE STATUS: Full code Diet recommendation: Carb consistent diet  Brief/Interim Summary: Louis [name] is an 57 y.o. male past medical history of essential hypertension, diabetes mellitus type 2 MRSA bacteremia with osteomyelitis, who had a recent COVID-19 infection 2 weeks prior to admission, got sick again 3 days prior to admission with shortness of breath chest pain cough and fever treated as an outpatient with antibiotics and steroids with no improvement found to have a large right pneumothorax and strep pneumo pneumonia   Staph aureus CAP (community acquired pneumonia): -Pleural fluid grew Staph aureus MRSA sensitive to Doxy and clinda. -Started empirically on IV vancomycin -Repeat CT chest showed no evidence of loculation. -Pulmonary was consulted recommended pain control continue current antibiotic 14-day course. -Transitioned to Zyvox orally on 11/25/2022. -Became confused and there was a concern etiology of her confusion was due to Zyvox switched back to IV vancomycin, and encephalopathy resolved now and switch to doxycycline on 6/3.   -Will discharge home on doxycycline 100 mg twice daily for 4 more days to finish total course of 2 weeks.  Tension pneumothorax status post chest tube: -Dislodged on 11/23/2022. -CT angio of the chest done on 11/28/2022 showed no PE (small loculated  effusion) was a concern -there were associated locules probably due to chest tube or bronchopleural fistula.  And stable patchy left upper lobe groundglass opacities -Chest tube has been removed.   Acute respiratory failure with hypoxia secondary to above: -He weaned off to room air -Will need to follow-up with pulmonary as an outpatient.   Recent COVID-19 infection: -has completed her isolation.   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 -Sodium 134 on the day of discharge   Diabetes mellitus type 2 uncontrolled with hyperglycemia: -Started on sliding scale insulin while in the hospital.  Resumed home glipizide and Basaglar -Recommend close follow-up with PCP.  Recommend low-carb diet   Essential hypertension: -Resume home medication   Chest pain: -Likely due to pneumonia tropes negative echo showed preserved EF CT angio negative for PE. -Chest pain resolved   Acute metabolic encephalopathy: -UDS negative on Lamictal. -Likely due to  Zyvox discontinued encephalopathy resolved.  Thrombocytosis: Likely reactive.  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 No other  explanation was found  and patient mental status returned to normal after linezolid was stopped.         Medication List     STOP taking these medications    guaifenesin 100 MG/5ML syrup Commonly known as: ROBITUSSIN Replaced by: guaiFENesin 600 MG 12 hr tablet       TAKE these medications    acetaminophen 325 MG tablet Commonly known as: TYLENOL Take 1-2 tablets (325-650 mg total) by mouth every 4 (four) hours as needed for mild pain.   amLODipine 10 MG tablet Commonly known as: NORVASC Take 1 tablet (10 mg total) by mouth daily.   aspirin EC 81 MG tablet Take 81 mg by mouth daily. Swallow whole.   atorvastatin 40 MG tablet Commonly known as: LIPITOR Take 40 mg by mouth daily.   benzonatate 100 MG capsule Commonly known as: TESSALON Take 1 capsule (100 mg total) by mouth 3 (three) times daily.   doxycycline 100 MG tablet Commonly known as: VIBRA-TABS Take 1 tablet (100 mg total) by mouth every 12 (twelve) hours for 4 days.   folic acid 1 MG tablet Commonly known as: FOLVITE Take 1 tablet (1 mg total) by mouth daily.   furosemide 20 MG tablet Commonly known as: LASIX Take 1 tablet (20 mg total) by mouth daily.   gabapentin 300 MG capsule Commonly known as: NEURONTIN Take 1 capsule (300 mg total) by mouth 3 (three) times daily. What changed: when to take this   glipiZIDE 5 MG 24 hr tablet Commonly known as: GLUCOTROL XL Take 5 mg by mouth daily.   guaiFENesin 600 MG 12 hr tablet Commonly known as: MUCINEX Take 1 tablet (600 mg total) by mouth 2 (two) times daily. Replaces: guaifenesin 100 MG/5ML syrup   lamoTRIgine 25 MG tablet Commonly known as: LAMICTAL Take 25 mg by mouth 2 (two) times daily.   losartan 25 MG tablet Commonly known as: COZAAR Take 0.5 tablets (12.5 mg total) by mouth every morning.   melatonin 5 MG Tabs Take 10 mg by mouth at bedtime.   metoprolol tartrate 25 MG tablet Commonly known as: LOPRESSOR Take 25 mg by mouth daily.   omeprazole 40 MG  capsule Commonly known as: PRILOSEC Take 40 mg by mouth daily.        Follow-up Information     Bobette Mo, NP Follow up in 1 week(s).   Specialty: Nurse Practitioner Contact information: [address] [place] [address] [phone]         Josephine Igo, DO Follow up in 2 week(s).   Specialty: Pulmonary Disease Contact information: [address] [place] [address] [phone]                Allergies  Allergen Reactions   Linezolid Other (See Comments)    Patient developed altered mental status on 3 doses of linezolid May 2024. No other explanation was found and patient mental status returned to normal after linezolid was stopped.     Consultations: PCCM   Procedures/Studies: CT Angio Chest Pulmonary Embolism (PE) W or WO Contrast  Result Date: 11/28/2022 CLINICAL DATA:  Shortness of breath EXAM: CT ANGIOGRAPHY CHEST WITH CONTRAST TECHNIQUE: Multidetector CT imaging of the chest was performed using the standard protocol during bolus administration of intravenous contrast. Multiplanar CT image reconstructions and MIPs were obtained to evaluate the vascular anatomy. RADIATION DOSE REDUCTION: This exam was performed according to the departmental dose-optimization program which includes automated exposure control, adjustment  of the mA and/or kV according to patient size and/or use of iterative reconstruction technique. CONTRAST:  50mL OMNIPAQUE IOHEXOL 350 MG/ML SOLN COMPARISON:  Chest CT dated November 25, 2022 FINDINGS: Cardiovascular: No evidence of pulmonary embolus. Normal heart size. No pericardial effusion. Caliber thoracic aorta with mild calcified plaque. 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 Mediastinum/Nodes: Small hiatal hernia. Esophagus and thyroid are unremarkable. Enlarged mediastinal and right hilar lymph nodes, unchanged when compared with the prior exam and likely reactive. Lungs/Pleura: Central airways are  patent. Interval decreased similar patchy left upper lobe ground-glass opacities. Interval decreased central predominant consolidations of the right hemithorax, likely resolving atelectasis. Stable small loculated right pleural effusion associated locules of gas. No evidence of pneumothorax. Upper Abdomen: No acute abnormality. Musculoskeletal: Interval decreased right chest wall subcutaneous emphysema. Prior median sternotomy with intact sternal wires. Advanced degenerative changes at T6-T7 with osseous fusion. No aggressive appearing osseous lesions. Review of the MIP images confirms the above findings. IMPRESSION: 1. No evidence of pulmonary embolus. 2. Stable small loculated right pleural effusion, empyema is a concern. Associated locules of gas may be due to prior chest tube or bronchopleural fistula. 3. Similar patchy left upper lobe ground-glass opacities, likely infectious or inflammatory. 4. Interval decreased central predominant consolidations of the right hemithorax, likely resolving atelectasis. 5. Stable enlarged mediastinal and right hilar lymph nodes, likely reactive. 6. Aortic Atherosclerosis (ICD10-I70.0). Electronically Signed   By: Allegra Lai M.D.   On: 11/28/2022 10:08   DG CHEST PORT 1 VIEW  Result Date: 11/28/2022 CLINICAL DATA:  Shortness of breath EXAM: PORTABLE CHEST 1 VIEW COMPARISON:  11/24/2018 FINDINGS: Cardiac shadow is stable. Postsurgical changes are noted. Chronic blunting of the right costophrenic angle is seen. Persistent airspace disease is noted within the right lung. Left lung remains clear. IMPRESSION: Stable appearance of the chest when compared with the prior exam. Electronically Signed   By: Alcide Clever M.D.   On: 11/28/2022 03:50   ECHOCARDIOGRAM COMPLETE  Result Date: 11/26/2022    ECHOCARDIOGRAM REPORT   Patient Name:   Louis [name]  Date of Exam: 11/26/2022 Medical Rec #:  [id number]  Height:       71.0 in Accession #:    [id number] Weight:       191.4 lb Date of  Birth:  [birth year]   BSA:          2.069 m Patient Age:    57 years   BP:           113/73 mmHg Patient Gender: M          HR:           91 bpm. Exam Location:  Inpatient Procedure: 2D Echo, Color Doppler and Cardiac Doppler Indications:    R07.9* Chest pain, unspecified  History:        Patient has prior history of Echocardiogram examinations, most                 recent 01/11/2021. CHF; Risk Factors:Hypertension, Diabetes and                 Dyslipidemia.  Sonographer:    Irving Burton Senior RDCS Referring Phys: [id number] PRANAV M PATEL IMPRESSIONS  1. Left ventricular ejection fraction, by estimation, is 60 to 65%. The left ventricle has normal function. The left ventricle has no regional wall motion abnormalities. There is mild asymmetric left ventricular hypertrophy of the basal-septal segment. Left ventricular diastolic parameters were normal.  2. Right ventricular systolic function  is normal. The right ventricular size is normal. Tricuspid regurgitation signal is inadequate for assessing PA pressure.  3. The mitral valve is normal in structure. Trivial mitral valve regurgitation. No evidence of mitral stenosis.  4. The aortic valve is tricuspid. Aortic valve regurgitation is trivial. No aortic stenosis is present.  5. The inferior vena cava is normal in size with greater than 50% respiratory variability, suggesting right atrial pressure of 3 mmHg. FINDINGS  Left Ventricle: Left ventricular ejection fraction, by estimation, is 60 to 65%. The left ventricle has normal function. The left ventricle has no regional wall motion abnormalities. The left ventricular internal cavity size was normal in size. There is  mild asymmetric left ventricular hypertrophy of the basal-septal segment. Left ventricular diastolic parameters were normal. Right Ventricle: The right ventricular size is normal. No increase in right ventricular wall thickness. Right ventricular systolic function is normal. Tricuspid regurgitation signal is  inadequate for assessing PA pressure. Left Atrium: Left atrial size was normal in size. Right Atrium: Right atrial size was normal in size. Pericardium: Trivial pericardial effusion is present. There is excessive respiratory variation in septal movement. Mitral Valve: The mitral valve is normal in structure. Trivial mitral valve regurgitation. No evidence of mitral valve stenosis. Tricuspid Valve: The tricuspid valve is normal in structure. Tricuspid valve regurgitation is trivial. Aortic Valve: The aortic valve is tricuspid. Aortic valve regurgitation is trivial. No aortic stenosis is present. Pulmonic Valve: The pulmonic valve was normal in structure. Pulmonic valve regurgitation is trivial. Aorta: The aortic root and ascending aorta are structurally normal, with no evidence of dilitation. Venous: The inferior vena cava is normal in size with greater than 50% respiratory variability, suggesting right atrial pressure of 3 mmHg. IAS/Shunts: No atrial level shunt detected by color flow Doppler.  LEFT VENTRICLE PLAX 2D LVIDd:         5.10 cm   Diastology LVIDs:         3.40 cm   LV e' medial:    7.29 cm/s LV PW:         0.80 cm   LV E/e' medial:  13.2 LV IVS:        1.10 cm   LV e' lateral:   15.40 cm/s LVOT diam:     2.30 cm   LV E/e' lateral: 6.2 LV SV:         82 LV SV Index:   40 LVOT Area:     4.15 cm  RIGHT VENTRICLE RV S prime:     12.70 cm/s TAPSE (M-mode): 2.0 cm LEFT ATRIUM             Index LA diam:        3.80 cm 1.84 cm/m LA Vol (A2C):   49.8 ml 24.07 ml/m LA Vol (A4C):   43.7 ml 21.12 ml/m LA Biplane Vol: 48.8 ml 23.58 ml/m  AORTIC VALVE LVOT Vmax:   109.00 cm/s LVOT Vmean:  73.300 cm/s LVOT VTI:    0.197 m  AORTA Ao Root diam: 3.30 cm Ao Asc diam:  3.40 cm MITRAL VALVE MV Area (PHT): 3.17 cm    SHUNTS MV Decel Time: 239 msec    Systemic VTI:  0.20 m MV E velocity: 95.90 cm/s  Systemic Diam: 2.30 cm MV A velocity: 82.50 cm/s MV E/A ratio:  1.16 Epifanio Lesches MD Electronically signed by  Epifanio Lesches MD Signature Date/Time: 11/26/2022/1:54:20 PM    Final    CT CHEST W CONTRAST  Result Date: 11/25/2022  CLINICAL DATA:  Pneumonia EXAM: CT CHEST WITH CONTRAST TECHNIQUE: Multidetector CT imaging of the chest was performed during intravenous contrast administration. RADIATION DOSE REDUCTION: This exam was performed according to the departmental dose-optimization program which includes automated exposure control, adjustment of the mA and/or kV according to patient size and/or use of iterative reconstruction technique. CONTRAST:  75mL OMNIPAQUE IOHEXOL 350 MG/ML SOLN COMPARISON:  Chest CT dated December 29, 2020 FINDINGS: Cardiovascular: Normal heart size. No pericardial effusion. Normal caliber thoracic aorta with mild atherosclerotic disease. 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 Mediastinum/Nodes: Esophagus and thyroid are unremarkable. Mildly enlarged mediastinal hilar lymph nodes, likely reactive. Largest is a right lower paratracheal lymph node measuring 1.4 cm in short axis on series 3, image 70. Lungs/Pleura: Central airways are patent. Central predominant consolidations of the right hemithorax bilateral patchy ground-glass opacities, most severe in the left upper lobe. Located small pleural effusion. Gas seen in the right pleural effusion, likely due to prior chest tube. Upper Abdomen: No acute abnormality. Musculoskeletal: Prior median sternotomy with intact sternal wires. No aggressive appearing osseous lesions. Soft tissue gas of the right chest wall, likely related to prior chest tube IMPRESSION: 1. Small loculated right pleural effusion, concerning for empyema. Associated locules of gas are likely due to right chest tube. 2. Central predominant consolidations of the right hemithorax. Additional bilateral patchy ground-glass opacities, most severe in the left upper lobe. Findings are concerning for multifocal infection. 3. Mildly enlarged mediastinal and hilar lymph nodes,  likely reactive. 4. Aortic Atherosclerosis (ICD10-I70.0). Electronically Signed   By: Allegra Lai M.D.   On: 11/25/2022 12:42   DG CHEST PORT 1 VIEW  Result Date: 11/24/2022 CLINICAL DATA:  Chest tube removal EXAM: PORTABLE CHEST 1 VIEW COMPARISON:  11/23/2022 FINDINGS: Interval removal of right chest tube. No pneumothorax. Diffuse right lung airspace disease and small right pleural effusion. Left lung clear. Heart is normal size. Prior CABG. IMPRESSION: Interval removal of right chest tube without pneumothorax. Diffuse right lung airspace disease and small right effusion. Electronically Signed   By: Charlett Nose M.D.   On: 11/24/2022 01:02   DG CHEST PORT 1 VIEW  Result Date: 11/23/2022 CLINICAL DATA:  Right pneumothorax status post chest tube placement EXAM: PORTABLE CHEST 1 VIEW COMPARISON:  Chest radiograph dated 11/23/2022 at 5:51 a.m. FINDINGS: Lines/tubes: Similar position of right sided pleural catheter. Lungs: Increased interstitial and patchy right lung opacities. Pleura: Increased small to moderate right pleural effusion. No definite pneumothorax. Heart/mediastinum: The heart size and mediastinal contours are within normal limits. Bones: Median sternotomy wires are nondisplaced. Persistent right chest wall subcutaneous emphysema. IMPRESSION: 1. Increased small to moderate right pleural effusion. No definite pneumothorax. 2. Increased interstitial and patchy right lung opacities, likely a combination of atelectasis and pulmonary edema. Electronically Signed   By: Agustin Cree M.D.   On: 11/23/2022 15:16   DG CHEST PORT 1 VIEW  Result Date: 11/23/2022 CLINICAL DATA:  Right pneumothorax status post chest tube placement EXAM: PORTABLE CHEST 1 VIEW COMPARISON:  Chest radiograph dated 11/13/2022 FINDINGS: Lines/tubes: Similar position of right pleural catheter. Lungs: Right basilar patchy opacities. Pleura: Small right pleural effusion.  No definite pneumothorax. Heart/mediastinum: The heart size  and mediastinal contours are within normal limits. Bones: Median sternotomy wires are nondisplaced. Moderate volume right chest wall subcutaneous emphysema. IMPRESSION: 1. Similar position of right pleural catheter. No definite pneumothorax. 2. Small right pleural effusion with right basilar patchy opacities, likely atelectasis. Electronically Signed   By: Milus Height.D.  On: 11/23/2022 15:14   DG CHEST PORT 1 VIEW  Result Date: 11/22/2022 CLINICAL DATA:  57 year old male with pleural catheter, after presenting with right side pneumothorax. EXAM: PORTABLE CHEST 1 VIEW COMPARISON:  Portable chest 11/21/2022 and earlier. FINDINGS: Portable AP semi upright view at 0509 hours. Right pigtail chest tube remains in place, terminating in the upper lung. Small volume right chest wall gas is stable. No discrete right pneumothorax identified. Lung volumes remain somewhat low. Previous CABG. Normal cardiac size and mediastinal contours. Allowing for portable technique the left lung is clear. Patchy nonspecific right lung base opacity is stable. Visualized tracheal air column is within normal limits. Stable visualized osseous structures. Negative visible bowel gas. IMPRESSION: 1. Stable from yesterday. Right chest tube with small volume right chest wall gas and nonspecific right lung base opacity but no residual pneumothorax. 2. No new cardiopulmonary abnormality. Electronically Signed   By: Odessa Fleming M.D.   On: 11/22/2022 06:34   DG Chest Portable 1 View  Result Date: 11/21/2022 CLINICAL DATA:  Pneumothorax, status post pleural drainage catheter placement EXAM: PORTABLE CHEST 1 VIEW COMPARISON:  11/21/2022 at 3:11 p.m. FINDINGS: A right pigtail pleural drainage catheter is in place. Questionable residual less than 5% pneumothorax along the lung apex, markedly improved from previous. Airspace opacity observed at the right lung base along the diaphragm, and in the right perihilar region. Prior CABG.  Low lung volumes.  IMPRESSION: 1. Markedly improved right pneumothorax status post pigtail pleural drainage catheter placement. There is a questionable residual less than 5% pneumothorax along the right lung apex. 2. Airspace opacity at the right lung base and in the right perihilar region. Followup PA and lateral chest X-ray is recommended in 3-4 weeks to ensure resolution and exclude underlying malignancy. 3. Prior CABG. Electronically Signed   By: Gaylyn Rong M.D.   On: 11/21/2022 18:09   DG Chest Port 1 View  Result Date: 11/21/2022 CLINICAL DATA:  Dyspnea EXAM: PORTABLE CHEST 1 VIEW COMPARISON:  12/29/2020 FINDINGS: There is a large right pneumothorax with central collapse of the right lung. No significant hyperexpansion of the right hemithorax or mediastinal shift to suggest tension physiology. No pneumothorax or pleural effusion on the left. Coronary artery bypass grafting has been performed. Cardiac size within normal limits. No acute bone abnormality. IMPRESSION: 1. Large right pneumothorax with central collapse of the right lung. Electronically Signed   By: Helyn Numbers M.D.   On: 11/21/2022 15:31      Subjective: Patient seen and examined.  Lying comfortably on the bed.  On room air.  Reports he is doing fine, no chest pain, shortness of breath, palpitation.  Remained afebrile.  No acute events overnight.  Wishes to go home today  Discharge Exam: Vitals:   12/01/22 0559 12/01/22 0739  BP: (!) 145/76 (!) 145/93  Pulse: 73 87  Resp: 16 20  Temp: 97.9 F (36.6 C) 98.7 F (37.1 C)  SpO2: 94% 92%   Vitals:   11/30/22 1548 11/30/22 2117 12/01/22 0559 12/01/22 0739  BP: 137/79 136/74 (!) 145/76 (!) 145/93  Pulse:  76 73 87  Resp:  18 16 20   Temp: 98 F (36.7 C) 98.3 F (36.8 C) 97.9 F (36.6 C) 98.7 F (37.1 C)  TempSrc: Oral  Oral   SpO2:  97% 94% 92%  Weight:      Height:        General: Pt is alert, awake, not in acute distress, on room air, communicating well Cardiovascular: RRR,  S1/S2 +, no rubs, no gallops Respiratory: CTA bilaterally, no wheezing, no rhonchi Abdominal: Soft, NT, ND, bowel sounds + Extremities: no edema, no cyanosis    The results of significant diagnostics from this hospitalization (including imaging, microbiology, ancillary and laboratory) are listed below for reference.     Microbiology: Recent Results (from the past 240 hour(s))  Blood Culture (routine x 2)     Status: None   Collection Time: 11/21/22  3:45 PM   Specimen: BLOOD  Result Value Ref Range Status   Specimen Description BLOOD SITE NOT SPECIFIED  Final   Special Requests   Final    BOTTLES DRAWN AEROBIC AND ANAEROBIC Blood Culture adequate volume   Culture   Final    NO GROWTH 5 DAYS Performed at [facility], [address] [place], [place] [address]    Report Status 11/26/2022 FINAL  Final  Resp panel by RT-PCR (RSV, Flu A&B, Covid) Anterior Nasal Swab     Status: Abnormal   Collection Time: 11/21/22  3:51 PM   Specimen: Anterior Nasal Swab  Result Value Ref Range Status   SARS Coronavirus 2 by RT PCR POSITIVE (A) NEGATIVE Final   Influenza A by PCR NEGATIVE NEGATIVE Final   Influenza B by PCR NEGATIVE NEGATIVE Final    Comment: (NOTE) The Xpert Xpress SARS-CoV-2/FLU/RSV plus assay is intended as an aid in the diagnosis of influenza from Nasopharyngeal swab specimens and should not be used as a sole basis for treatment. Nasal washings and aspirates are unacceptable for Xpert Xpress SARS-CoV-2/FLU/RSV testing.  Fact Sheet for Patients: BloggerCourse.com  Fact Sheet for Healthcare Providers: SeriousBroker.it  This test is not yet approved or cleared by the [place] FDA and has been authorized for detection and/or diagnosis of SARS-CoV-2 by FDA under an Emergency Use Authorization (EUA). This EUA will remain in effect (meaning this test can be used) for the duration of the COVID-19 declaration under  Section 564(b)(1) of the Act, 21 U.S.C. section 360bbb-3(b)(1), unless the authorization is terminated or revoked.     Resp Syncytial Virus by PCR NEGATIVE NEGATIVE Final    Comment: (NOTE) Fact Sheet for Patients: BloggerCourse.com  Fact Sheet for Healthcare Providers: SeriousBroker.it  This test is not yet approved or cleared by the [place] FDA and has been authorized for detection and/or diagnosis of SARS-CoV-2 by FDA under an Emergency Use Authorization (EUA). This EUA will remain in effect (meaning this test can be used) for the duration of the COVID-19 declaration under Section 564(b)(1) of the Act, 21 U.S.C. section 360bbb-3(b)(1), unless the authorization is terminated or revoked.  Performed at [facility], [address], [place], [place] [address]   Body fluid culture w Gram Stain     Status: None   Collection Time: 11/21/22  9:32 PM   Specimen: Pleural Fluid  Result Value Ref Range Status   Specimen Description PLEURAL  Final   Special Requests NONE  Final   Gram Stain   Final    MODERATE WBC PRESENT, PREDOMINANTLY PMN NO ORGANISMS SEEN    Culture   Final    ABUNDANT METHICILLIN RESISTANT STAPHYLOCOCCUS AUREUS CRITICAL RESULT CALLED TO, READ BACK BY AND VERIFIED WITH: RN W.OWEN AT 1030 ON 11/22/2022 BY T.SAAD. Performed at [facility], [address] [place], [place] [address]    Report Status 11/24/2022 FINAL  Final   Organism ID, Bacteria METHICILLIN RESISTANT STAPHYLOCOCCUS AUREUS  Final      Susceptibility  Methicillin resistant staphylococcus aureus - MIC*    CIPROFLOXACIN >=8 RESISTANT Resistant     ERYTHROMYCIN >=8 RESISTANT Resistant     GENTAMICIN <=0.5 SENSITIVE Sensitive     OXACILLIN >=4 RESISTANT Resistant     TETRACYCLINE <=1 SENSITIVE Sensitive     VANCOMYCIN <=0.5 SENSITIVE Sensitive     TRIMETH/SULFA >=320 RESISTANT Resistant     CLINDAMYCIN <=0.25 SENSITIVE Sensitive      RIFAMPIN <=0.5 SENSITIVE Sensitive     Inducible Clindamycin NEGATIVE Sensitive     LINEZOLID 2 SENSITIVE Sensitive     * ABUNDANT METHICILLIN RESISTANT STAPHYLOCOCCUS AUREUS  MRSA Next Gen by PCR, Nasal     Status: Abnormal   Collection Time: 11/21/22 10:00 PM   Specimen: Nasal Mucosa; Nasal Swab  Result Value Ref Range Status   MRSA by PCR Next Gen DETECTED (A) NOT DETECTED Final    Comment: RESULT CALLED TO, READ BACK BY AND VERIFIED WITH: L NITURIDA,RN@0012  11/22/22 MK (NOTE) The GeneXpert MRSA Assay (FDA approved for NASAL specimens only), is one component of a comprehensive MRSA colonization surveillance program. It is not intended to diagnose MRSA infection nor to guide or monitor treatment for MRSA infections. Test performance is not FDA approved in patients less than 31 years old. Performed at [facility], [address], [place], [place] [address]   Blood Culture (routine x 2)     Status: None   Collection Time: 11/22/22 12:27 AM   Specimen: BLOOD LEFT ARM  Result Value Ref Range Status   Specimen Description BLOOD LEFT ARM  Final   Special Requests   Final    BOTTLES DRAWN AEROBIC AND ANAEROBIC Blood Culture results may not be optimal due to an inadequate volume of blood received in culture bottles   Culture   Final    NO GROWTH 5 DAYS Performed at [facility], [address] [place], [place] [address]    Report Status 11/27/2022 FINAL  Final     Labs: BNP (last 3 results) Recent Labs    11/21/22 1500  BNP 204.6*   Basic Metabolic Panel: Recent Labs  Lab 11/25/22 0006 11/26/22 1040 11/27/22 0027 11/28/22 0058 11/29/22 0041 12/01/22 1104  NA 132* 131* 137 135 133* 134*  K 4.2 4.5 3.5 3.7 3.7 4.1  CL 94* 96* 99 100 97* 99  CO2 28 27 28 27 28 28   GLUCOSE 274* 273* 100* 214* 355* 121*  BUN 14 13 9 10 9  <5*  CREATININE 0.99 0.90 0.68 0.93 0.86 0.89  CALCIUM 8.2* 7.7* 7.7* 7.8* 7.6* 8.2*  MG 2.1 2.1 1.9 2.0 1.9  --    Liver Function  Tests: Recent Labs  Lab 11/26/22 1040 11/27/22 0027 11/28/22 0058 11/29/22 0041  AST 16 13* 14* 16  ALT 11 13 13 14   ALKPHOS 115 97 95 92  BILITOT 0.7 0.9 0.9 0.4  PROT 5.7* 5.3* 5.7* 5.6*  ALBUMIN 2.0* 1.9* 1.8* 1.7*   No results for input(s): "LIPASE", "AMYLASE" in the last 168 hours. Recent Labs  Lab 11/26/22 1040  AMMONIA 24   CBC: Recent Labs  Lab 11/26/22 1040 11/27/22 0027 11/28/22 0058 11/29/22 0041 12/01/22 1104  WBC 15.2* 16.2* 14.1* 11.0* 10.8*  NEUTROABS 12.6* 13.3* 11.1* 8.4*  --   HGB 11.8* 11.3* 11.4* 10.7* 11.0*  HCT 36.2* 34.1* 34.2* 33.1* 33.5*  MCV 93.1 91.2 92.7 92.7 91.3  PLT 356 387 429* 469* 527*   Cardiac Enzymes: No results for input(s): "CKTOTAL", "CKMB", "  CKMBINDEX", "TROPONINI" in the last 168 hours. BNP: Invalid input(s): "POCBNP" CBG: Recent Labs  Lab 11/30/22 1700 11/30/22 2120 12/01/22 0121 12/01/22 0740 12/01/22 1107  GLUCAP 119* 213* 286* 124* 116*   D-Dimer No results for input(s): "DDIMER" in the last 72 hours. Hgb A1c No results for input(s): "HGBA1C" in the last 72 hours. Lipid Profile No results for input(s): "CHOL", "HDL", "LDLCALC", "TRIG", "CHOLHDL", "LDLDIRECT" in the last 72 hours. Thyroid function studies No results for input(s): "TSH", "T4TOTAL", "T3FREE", "THYROIDAB" in the last 72 hours.  Invalid input(s): "FREET3" Anemia work up No results for input(s): "VITAMINB12", "FOLATE", "FERRITIN", "TIBC", "IRON", "RETICCTPCT" in the last 72 hours. Urinalysis    Component Value Date/Time   COLORURINE YELLOW 11/21/2022 2130   APPEARANCEUR CLEAR 11/21/2022 2130   LABSPEC 1.028 11/21/2022 2130   PHURINE 5.0 11/21/2022 2130   GLUCOSEU 150 (A) 11/21/2022 2130   HGBUR NEGATIVE 11/21/2022 2130   BILIRUBINUR NEGATIVE 11/21/2022 2130   KETONESUR 5 (A) 11/21/2022 2130   PROTEINUR 30 (A) 11/21/2022 2130   NITRITE NEGATIVE 11/21/2022 2130   LEUKOCYTESUR NEGATIVE 11/21/2022 2130   Sepsis Labs Recent Labs  Lab  11/27/22 0027 11/28/22 0058 11/29/22 0041 12/01/22 1104  WBC 16.2* 14.1* 11.0* 10.8*   Microbiology Recent Results (from the past 240 hour(s))  Blood Culture (routine x 2)     Status: None   Collection Time: 11/21/22  3:45 PM   Specimen: BLOOD  Result Value Ref Range Status   Specimen Description BLOOD SITE NOT SPECIFIED  Final   Special Requests   Final    BOTTLES DRAWN AEROBIC AND ANAEROBIC Blood Culture adequate volume   Culture   Final    NO GROWTH 5 DAYS Performed at [facility], [address], [place], [place] [address]    Report Status 11/26/2022 FINAL  Final  Resp panel by RT-PCR (RSV, Flu A&B, Covid) Anterior Nasal Swab     Status: Abnormal   Collection Time: 11/21/22  3:51 PM   Specimen: Anterior Nasal Swab  Result Value Ref Range Status   SARS Coronavirus 2 by RT PCR POSITIVE (A) NEGATIVE Final   Influenza A by PCR NEGATIVE NEGATIVE Final   Influenza B by PCR NEGATIVE NEGATIVE Final    Comment: (NOTE) The Xpert Xpress SARS-CoV-2/FLU/RSV plus assay is intended as an aid in the diagnosis of influenza from Nasopharyngeal swab specimens and should not be used as a sole basis for treatment. Nasal washings and aspirates are unacceptable for Xpert Xpress SARS-CoV-2/FLU/RSV testing.  Fact Sheet for Patients: BloggerCourse.com  Fact Sheet for Healthcare Providers: SeriousBroker.it  This test is not yet approved or cleared by the [place] FDA and has been authorized for detection and/or diagnosis of SARS-CoV-2 by FDA under an Emergency Use Authorization (EUA). This EUA will remain in effect (meaning this test can be used) for the duration of the COVID-19 declaration under Section 564(b)(1) of the Act, 21 U.S.C. section 360bbb-3(b)(1), unless the authorization is terminated or revoked.     Resp Syncytial Virus by PCR NEGATIVE NEGATIVE Final    Comment: (NOTE) Fact Sheet for  Patients: BloggerCourse.com  Fact Sheet for Healthcare Providers: SeriousBroker.it  This test is not yet approved or cleared by the [place] FDA and has been authorized for detection and/or diagnosis of SARS-CoV-2 by FDA under an Emergency Use Authorization (EUA). This EUA will remain in effect (meaning this test can be used) for the duration of the COVID-19 declaration under Section 564(b)(1) of the Act, 21  U.S.C. section 360bbb-3(b)(1), unless the authorization is terminated or revoked.  Performed at [facility], [address], [place], [place] [address]   Body fluid culture w Gram Stain     Status: None   Collection Time: 11/21/22  9:32 PM   Specimen: Pleural Fluid  Result Value Ref Range Status   Specimen Description PLEURAL  Final   Special Requests NONE  Final   Gram Stain   Final    MODERATE WBC PRESENT, PREDOMINANTLY PMN NO ORGANISMS SEEN    Culture   Final    ABUNDANT METHICILLIN RESISTANT STAPHYLOCOCCUS AUREUS CRITICAL RESULT CALLED TO, READ BACK BY AND VERIFIED WITH: RN W.OWEN AT 1030 ON 11/22/2022 BY T.SAAD. Performed at [facility], [address], [place], [place] [address]    Report Status 11/24/2022 FINAL  Final   Organism ID, Bacteria METHICILLIN RESISTANT STAPHYLOCOCCUS AUREUS  Final      Susceptibility   Methicillin resistant staphylococcus aureus - MIC*    CIPROFLOXACIN >=8 RESISTANT Resistant     ERYTHROMYCIN >=8 RESISTANT Resistant     GENTAMICIN <=0.5 SENSITIVE Sensitive     OXACILLIN >=4 RESISTANT Resistant     TETRACYCLINE <=1 SENSITIVE Sensitive     VANCOMYCIN <=0.5 SENSITIVE Sensitive     TRIMETH/SULFA >=320 RESISTANT Resistant     CLINDAMYCIN <=0.25 SENSITIVE Sensitive     RIFAMPIN <=0.5 SENSITIVE Sensitive     Inducible Clindamycin NEGATIVE Sensitive     LINEZOLID 2 SENSITIVE Sensitive     * ABUNDANT METHICILLIN RESISTANT STAPHYLOCOCCUS AUREUS  MRSA Next Gen by PCR, Nasal      Status: Abnormal   Collection Time: 11/21/22 10:00 PM   Specimen: Nasal Mucosa; Nasal Swab  Result Value Ref Range Status   MRSA by PCR Next Gen DETECTED (A) NOT DETECTED Final    Comment: RESULT CALLED TO, READ BACK BY AND VERIFIED WITH: L NITURIDA,RN@0012  11/22/22 MK (NOTE) The GeneXpert MRSA Assay (FDA approved for NASAL specimens only), is one component of a comprehensive MRSA colonization surveillance program. It is not intended to diagnose MRSA infection nor to guide or monitor treatment for MRSA infections. Test performance is not FDA approved in patients less than 58 years old. Performed at [facility], [address] [place], [place] [address]   Blood Culture (routine x 2)     Status: None   Collection Time: 11/22/22 12:27 AM   Specimen: BLOOD LEFT ARM  Result Value Ref Range Status   Specimen Description BLOOD LEFT ARM  Final   Special Requests   Final    BOTTLES DRAWN AEROBIC AND ANAEROBIC Blood Culture results may not be optimal due to an inadequate volume of blood received in culture bottles   Culture   Final    NO GROWTH 5 DAYS Performed at [facility], [address], [place], [place] [address]    Report Status 11/27/2022 FINAL  Final     Time coordinating discharge: Over 30 minutes  SIGNED:   Ollen Bowl, MD  [facility] 12/01/2022, 12:50 PM Pager   If 7PM-7AM, please contact night-coverage [email or web address]

## 2022-12-08 ENCOUNTER — Ambulatory Visit: Payer: Federal, State, Local not specified - PPO | Admitting: Pulmonary Disease

## 2022-12-08 ENCOUNTER — Encounter: Payer: Self-pay | Admitting: Pulmonary Disease

## 2022-12-08 VITALS — BP 112/60 | HR 68 | Temp 97.3°F | Ht 70.0 in | Wt 186.6 lb

## 2022-12-08 DIAGNOSIS — J869 Pyothorax without fistula: Secondary | ICD-10-CM

## 2022-12-08 NOTE — Progress Notes (Signed)
@Patient  ID: Louis [name], male    DOB: October 15, 1965, 57 y.o.   MRN: [id number]  Chief Complaint  Patient presents with   Hospitalization Follow-up    POST COVID     Referring provider: Bobette Mo, NP  HPI:   57 y.o. man whom we are seeing in hospital follow-up for COVID-pneumonia, MRSA pneumonia, MRSA empyema.  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  Multiple pulmonary notes reviewed.  Discharge summary reviewed.  Here for hospital follow-up.  Discharged about a week ago.  Had severe right-sided chest pain.  Went to the ED.  All this was preceded by significant cough, coughing fits and illness for a while.  Chest x-ray had a large right-sided pneumothorax.  Chest tube was placed.  Lung reinflated but subsequently after chest tube placement demonstrated pleural effusion.  This was sampled via chest tube and grew MRSA.  Treated with antibiotics.  Chest tube came out maybe a bit prematurely.  No residual pneumothorax.  Smaller loculated effusion.  Assessed on bedside ultrasound without significant pocket to place second chest tube.  Discharged on 2 weeks of doxycycline which she recently completed.  Getting strength back.  Has some tightness occasionally his right chest with deep breaths.  Otherwise doing fine.  Questionaires / Pulmonary Flowsheets:   ACT:      No data to display          MMRC:     No data to display          Epworth:      No data to display          Tests:   FENO:  No results found for: "NITRICOXIDE"  PFT:     No data to display          WALK:      No data to display          Imaging: Personally reviewed and as per EMR and discussion in this note CT Angio Chest Pulmonary Embolism (PE) W or WO Contrast  Result Date: 11/28/2022 CLINICAL DATA:  Shortness of breath EXAM: CT ANGIOGRAPHY CHEST WITH CONTRAST TECHNIQUE: Multidetector CT imaging of  the chest was performed using the standard protocol during bolus administration of intravenous contrast. Multiplanar CT image reconstructions and MIPs were obtained to evaluate the vascular anatomy. RADIATION DOSE REDUCTION: This exam was performed according to the departmental dose-optimization program which includes automated exposure control, adjustment of the mA and/or kV according to patient size and/or use of iterative reconstruction technique. CONTRAST:  50mL OMNIPAQUE IOHEXOL 350 MG/ML SOLN COMPARISON:  Chest CT dated November 25, 2022 FINDINGS: Cardiovascular: No evidence of pulmonary embolus. Normal heart size. No pericardial effusion. Caliber thoracic aorta with mild calcified plaque. 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 Mediastinum/Nodes: Small hiatal hernia. Esophagus and thyroid are unremarkable. Enlarged mediastinal and right hilar lymph nodes, unchanged when compared with the prior exam and likely reactive. Lungs/Pleura: Central airways are patent. Interval decreased similar patchy left upper lobe ground-glass opacities. Interval decreased central predominant consolidations of the right hemithorax, likely resolving atelectasis. Stable small loculated right pleural effusion associated locules of gas. No evidence of pneumothorax. Upper Abdomen: No acute abnormality. Musculoskeletal: Interval decreased right chest wall subcutaneous emphysema. Prior median sternotomy with intact sternal wires. Advanced degenerative changes at T6-T7 with osseous fusion. No aggressive appearing osseous lesions. Review of the MIP images confirms the above findings. IMPRESSION: 1. No evidence of pulmonary embolus. 2. Stable  small loculated right pleural effusion, empyema is a concern. Associated locules of gas may be due to prior chest tube or bronchopleural fistula. 3. Similar patchy left upper lobe ground-glass opacities, likely infectious or inflammatory. 4. Interval decreased central predominant  consolidations of the right hemithorax, likely resolving atelectasis. 5. Stable enlarged mediastinal and right hilar lymph nodes, likely reactive. 6. Aortic Atherosclerosis (ICD10-I70.0). Electronically Signed   By: Allegra Lai M.D.   On: 11/28/2022 10:08   DG CHEST PORT 1 VIEW  Result Date: 11/28/2022 CLINICAL DATA:  Shortness of breath EXAM: PORTABLE CHEST 1 VIEW COMPARISON:  11/24/2018 FINDINGS: Cardiac shadow is stable. Postsurgical changes are noted. Chronic blunting of the right costophrenic angle is seen. Persistent airspace disease is noted within the right lung. Left lung remains clear. IMPRESSION: Stable appearance of the chest when compared with the prior exam. Electronically Signed   By: Alcide Clever M.D.   On: 11/28/2022 03:50   ECHOCARDIOGRAM COMPLETE  Result Date: 11/26/2022    ECHOCARDIOGRAM REPORT   Patient Name:   Louis [name]  Date of Exam: 11/26/2022 Medical Rec #:  [id number]  Height:       71.0 in Accession #:    [id number] Weight:       191.4 lb Date of Birth:  [birth year]   BSA:          2.069 m Patient Age:    57 years   BP:           113/73 mmHg Patient Gender: M          HR:           91 bpm. Exam Location:  Inpatient Procedure: 2D Echo, Color Doppler and Cardiac Doppler Indications:    R07.9* Chest pain, unspecified  History:        Patient has prior history of Echocardiogram examinations, most                 recent 01/11/2021. CHF; Risk Factors:Hypertension, Diabetes and                 Dyslipidemia.  Sonographer:    Irving Burton Senior RDCS Referring Phys: [id number] PRANAV M PATEL IMPRESSIONS  1. Left ventricular ejection fraction, by estimation, is 60 to 65%. The left ventricle has normal function. The left ventricle has no regional wall motion abnormalities. There is mild asymmetric left ventricular hypertrophy of the basal-septal segment. Left ventricular diastolic parameters were normal.  2. Right ventricular systolic function is normal. The right ventricular size is normal. Tricuspid  regurgitation signal is inadequate for assessing PA pressure.  3. The mitral valve is normal in structure. Trivial mitral valve regurgitation. No evidence of mitral stenosis.  4. The aortic valve is tricuspid. Aortic valve regurgitation is trivial. No aortic stenosis is present.  5. The inferior vena cava is normal in size with greater than 50% respiratory variability, suggesting right atrial pressure of 3 mmHg. FINDINGS  Left Ventricle: Left ventricular ejection fraction, by estimation, is 60 to 65%. The left ventricle has normal function. The left ventricle has no regional wall motion abnormalities. The left ventricular internal cavity size was normal in size. There is  mild asymmetric left ventricular hypertrophy of the basal-septal segment. Left ventricular diastolic parameters were normal. Right Ventricle: The right ventricular size is normal. No increase in right ventricular wall thickness. Right ventricular systolic function is normal. Tricuspid regurgitation signal is inadequate for assessing PA pressure. Left Atrium: Left atrial size was normal in size. Right Atrium: Right atrial  size was normal in size. Pericardium: Trivial pericardial effusion is present. There is excessive respiratory variation in septal movement. Mitral Valve: The mitral valve is normal in structure. Trivial mitral valve regurgitation. No evidence of mitral valve stenosis. Tricuspid Valve: The tricuspid valve is normal in structure. Tricuspid valve regurgitation is trivial. Aortic Valve: The aortic valve is tricuspid. Aortic valve regurgitation is trivial. No aortic stenosis is present. Pulmonic Valve: The pulmonic valve was normal in structure. Pulmonic valve regurgitation is trivial. Aorta: The aortic root and ascending aorta are structurally normal, with no evidence of dilitation. Venous: The inferior vena cava is normal in size with greater than 50% respiratory variability, suggesting right atrial pressure of 3 mmHg. IAS/Shunts: No  atrial level shunt detected by color flow Doppler.  LEFT VENTRICLE PLAX 2D LVIDd:         5.10 cm   Diastology LVIDs:         3.40 cm   LV e' medial:    7.29 cm/s LV PW:         0.80 cm   LV E/e' medial:  13.2 LV IVS:        1.10 cm   LV e' lateral:   15.40 cm/s LVOT diam:     2.30 cm   LV E/e' lateral: 6.2 LV SV:         82 LV SV Index:   40 LVOT Area:     4.15 cm  RIGHT VENTRICLE RV S prime:     12.70 cm/s TAPSE (M-mode): 2.0 cm LEFT ATRIUM             Index LA diam:        3.80 cm 1.84 cm/m LA Vol (A2C):   49.8 ml 24.07 ml/m LA Vol (A4C):   43.7 ml 21.12 ml/m LA Biplane Vol: 48.8 ml 23.58 ml/m  AORTIC VALVE LVOT Vmax:   109.00 cm/s LVOT Vmean:  73.300 cm/s LVOT VTI:    0.197 m  AORTA Ao Root diam: 3.30 cm Ao Asc diam:  3.40 cm MITRAL VALVE MV Area (PHT): 3.17 cm    SHUNTS MV Decel Time: 239 msec    Systemic VTI:  0.20 m MV E velocity: 95.90 cm/s  Systemic Diam: 2.30 cm MV A velocity: 82.50 cm/s MV E/A ratio:  1.16 Epifanio Lesches MD Electronically signed by Epifanio Lesches MD Signature Date/Time: 11/26/2022/1:54:20 PM    Final    CT CHEST W CONTRAST  Result Date: 11/25/2022 CLINICAL DATA:  Pneumonia EXAM: CT CHEST WITH CONTRAST TECHNIQUE: Multidetector CT imaging of the chest was performed during intravenous contrast administration. RADIATION DOSE REDUCTION: This exam was performed according to the departmental dose-optimization program which includes automated exposure control, adjustment of the mA and/or kV according to patient size and/or use of iterative reconstruction technique. CONTRAST:  75mL OMNIPAQUE IOHEXOL 350 MG/ML SOLN COMPARISON:  Chest CT dated December 29, 2020 FINDINGS: Cardiovascular: Normal heart size. No pericardial effusion. Normal caliber thoracic aorta with mild atherosclerotic disease. 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 Mediastinum/Nodes: Esophagus and thyroid are unremarkable. Mildly enlarged mediastinal hilar lymph nodes, likely reactive. Largest is a  right lower paratracheal lymph node measuring 1.4 cm in short axis on series 3, image 70. Lungs/Pleura: Central airways are patent. Central predominant consolidations of the right hemithorax bilateral patchy ground-glass opacities, most severe in the left upper lobe. Located small pleural effusion. Gas seen in the right pleural effusion, likely due to prior chest tube. Upper Abdomen: No acute abnormality. Musculoskeletal: Prior  median sternotomy with intact sternal wires. No aggressive appearing osseous lesions. Soft tissue gas of the right chest wall, likely related to prior chest tube IMPRESSION: 1. Small loculated right pleural effusion, concerning for empyema. Associated locules of gas are likely due to right chest tube. 2. Central predominant consolidations of the right hemithorax. Additional bilateral patchy ground-glass opacities, most severe in the left upper lobe. Findings are concerning for multifocal infection. 3. Mildly enlarged mediastinal and hilar lymph nodes, likely reactive. 4. Aortic Atherosclerosis (ICD10-I70.0). Electronically Signed   By: Allegra Lai M.D.   On: 11/25/2022 12:42   DG CHEST PORT 1 VIEW  Result Date: 11/24/2022 CLINICAL DATA:  Chest tube removal EXAM: PORTABLE CHEST 1 VIEW COMPARISON:  11/23/2022 FINDINGS: Interval removal of right chest tube. No pneumothorax. Diffuse right lung airspace disease and small right pleural effusion. Left lung clear. Heart is normal size. Prior CABG. IMPRESSION: Interval removal of right chest tube without pneumothorax. Diffuse right lung airspace disease and small right effusion. Electronically Signed   By: Charlett Nose M.D.   On: 11/24/2022 01:02   DG CHEST PORT 1 VIEW  Result Date: 11/23/2022 CLINICAL DATA:  Right pneumothorax status post chest tube placement EXAM: PORTABLE CHEST 1 VIEW COMPARISON:  Chest radiograph dated 11/23/2022 at 5:51 a.m. FINDINGS: Lines/tubes: Similar position of right sided pleural catheter. Lungs: Increased  interstitial and patchy right lung opacities. Pleura: Increased small to moderate right pleural effusion. No definite pneumothorax. Heart/mediastinum: The heart size and mediastinal contours are within normal limits. Bones: Median sternotomy wires are nondisplaced. Persistent right chest wall subcutaneous emphysema. IMPRESSION: 1. Increased small to moderate right pleural effusion. No definite pneumothorax. 2. Increased interstitial and patchy right lung opacities, likely a combination of atelectasis and pulmonary edema. Electronically Signed   By: Agustin Cree M.D.   On: 11/23/2022 15:16   DG CHEST PORT 1 VIEW  Result Date: 11/23/2022 CLINICAL DATA:  Right pneumothorax status post chest tube placement EXAM: PORTABLE CHEST 1 VIEW COMPARISON:  Chest radiograph dated 11/13/2022 FINDINGS: Lines/tubes: Similar position of right pleural catheter. Lungs: Right basilar patchy opacities. Pleura: Small right pleural effusion.  No definite pneumothorax. Heart/mediastinum: The heart size and mediastinal contours are within normal limits. Bones: Median sternotomy wires are nondisplaced. Moderate volume right chest wall subcutaneous emphysema. IMPRESSION: 1. Similar position of right pleural catheter. No definite pneumothorax. 2. Small right pleural effusion with right basilar patchy opacities, likely atelectasis. Electronically Signed   By: Agustin Cree M.D.   On: 11/23/2022 15:14   DG CHEST PORT 1 VIEW  Result Date: 11/22/2022 CLINICAL DATA:  57 year old male with pleural catheter, after presenting with right side pneumothorax. EXAM: PORTABLE CHEST 1 VIEW COMPARISON:  Portable chest 11/21/2022 and earlier. FINDINGS: Portable AP semi upright view at 0509 hours. Right pigtail chest tube remains in place, terminating in the upper lung. Small volume right chest wall gas is stable. No discrete right pneumothorax identified. Lung volumes remain somewhat low. Previous CABG. Normal cardiac size and mediastinal contours. Allowing for  portable technique the left lung is clear. Patchy nonspecific right lung base opacity is stable. Visualized tracheal air column is within normal limits. Stable visualized osseous structures. Negative visible bowel gas. IMPRESSION: 1. Stable from yesterday. Right chest tube with small volume right chest wall gas and nonspecific right lung base opacity but no residual pneumothorax. 2. No new cardiopulmonary abnormality. Electronically Signed   By: Odessa Fleming M.D.   On: 11/22/2022 06:34   DG Chest Portable 1 View  Result  Date: 11/21/2022 CLINICAL DATA:  Pneumothorax, status post pleural drainage catheter placement EXAM: PORTABLE CHEST 1 VIEW COMPARISON:  11/21/2022 at 3:11 p.m. FINDINGS: A right pigtail pleural drainage catheter is in place. Questionable residual less than 5% pneumothorax along the lung apex, markedly improved from previous. Airspace opacity observed at the right lung base along the diaphragm, and in the right perihilar region. Prior CABG.  Low lung volumes. IMPRESSION: 1. Markedly improved right pneumothorax status post pigtail pleural drainage catheter placement. There is a questionable residual less than 5% pneumothorax along the right lung apex. 2. Airspace opacity at the right lung base and in the right perihilar region. Followup PA and lateral chest X-ray is recommended in 3-4 weeks to ensure resolution and exclude underlying malignancy. 3. Prior CABG. Electronically Signed   By: Gaylyn Rong M.D.   On: 11/21/2022 18:09   DG Chest Port 1 View  Result Date: 11/21/2022 CLINICAL DATA:  Dyspnea EXAM: PORTABLE CHEST 1 VIEW COMPARISON:  12/29/2020 FINDINGS: There is a large right pneumothorax with central collapse of the right lung. No significant hyperexpansion of the right hemithorax or mediastinal shift to suggest tension physiology. No pneumothorax or pleural effusion on the left. Coronary artery bypass grafting has been performed. Cardiac size within normal limits. No acute bone  abnormality. IMPRESSION: 1. Large right pneumothorax with central collapse of the right lung. Electronically Signed   By: Helyn Numbers M.D.   On: 11/21/2022 15:31    Lab Results:  CBC    Component Value Date/Time   WBC 10.8 (H) 12/01/2022 1104   RBC 3.67 (L) 12/01/2022 1104   HGB 11.0 (L) 12/01/2022 1104   HCT 33.5 (L) 12/01/2022 1104   PLT 527 (H) 12/01/2022 1104   MCV 91.3 12/01/2022 1104   MCH 30.0 12/01/2022 1104   MCHC 32.8 12/01/2022 1104   RDW 14.3 12/01/2022 1104   LYMPHSABS 1.4 11/29/2022 0041   MONOABS 0.8 11/29/2022 0041   EOSABS 0.2 11/29/2022 0041   BASOSABS 0.0 11/29/2022 0041    BMET    Component Value Date/Time   NA 134 (L) 12/01/2022 1104   K 4.1 12/01/2022 1104   CL 99 12/01/2022 1104   CO2 28 12/01/2022 1104   GLUCOSE 121 (H) 12/01/2022 1104   BUN <5 (L) 12/01/2022 1104   CREATININE 0.89 12/01/2022 1104   CALCIUM 8.2 (L) 12/01/2022 1104   GFRNONAA >60 12/01/2022 1104    BNP    Component Value Date/Time   BNP 204.6 (H) 11/21/2022 1500    ProBNP No results found for: "PROBNP"  Specialty Problems       Pulmonary Problems   CAP (community acquired pneumonia)   Pneumothorax    Allergies  Allergen Reactions   Linezolid Other (See Comments)    Patient developed altered mental status on 3 doses of linezolid May 2024. No other explanation was found and patient mental status returned to normal after linezolid was stopped.      There is no immunization history on file for this patient.  Past Medical History:  Diagnosis Date   CAD (coronary artery disease)    DM (diabetes mellitus) (HCC)    Hyperlipidemia    Hypertension     Tobacco History: Social History   Tobacco Use  Smoking Status Former   Packs/day: 2   Types: Cigarettes   Start date: 06/28/1995   Quit date: 11/17/2022   Years since quitting: 0.0  Smokeless Tobacco Never   Counseling given: Not Answered   Continue to not  smoke  Outpatient Encounter Medications as of  12/08/2022  Medication Sig   acetaminophen (TYLENOL) 325 MG tablet Take 1-2 tablets (325-650 mg total) by mouth every 4 (four) hours as needed for mild pain.   amLODipine (NORVASC) 10 MG tablet Take 1 tablet (10 mg total) by mouth daily.   aspirin EC 81 MG tablet Take 81 mg by mouth daily. Swallow whole.   atorvastatin (LIPITOR) 40 MG tablet Take 40 mg by mouth daily.   benzonatate (TESSALON) 100 MG capsule Take 1 capsule (100 mg total) by mouth 3 (three) times daily.   folic acid (FOLVITE) 1 MG tablet Take 1 tablet (1 mg total) by mouth daily.   furosemide (LASIX) 20 MG tablet Take 1 tablet (20 mg total) by mouth daily.   gabapentin (NEURONTIN) 300 MG capsule Take 1 capsule (300 mg total) by mouth 3 (three) times daily. (Patient taking differently: Take 300 mg by mouth 2 (two) times daily.)   glipiZIDE (GLUCOTROL XL) 5 MG 24 hr tablet Take 5 mg by mouth daily.   guaiFENesin (MUCINEX) 600 MG 12 hr tablet Take 1 tablet (600 mg total) by mouth 2 (two) times daily.   lamoTRIgine (LAMICTAL) 25 MG tablet Take 25 mg by mouth 2 (two) times daily.   losartan (COZAAR) 25 MG tablet Take 0.5 tablets (12.5 mg total) by mouth every morning.   melatonin 5 MG TABS Take 10 mg by mouth at bedtime.   metoprolol tartrate (LOPRESSOR) 25 MG tablet Take 25 mg by mouth daily.   omeprazole (PRILOSEC) 40 MG capsule Take 40 mg by mouth daily.   Oxycodone HCl 10 MG TABS Take 10 mg by mouth every 8 (eight) hours as needed.   No facility-administered encounter medications on file as of 12/08/2022.     Review of Systems  Review of Systems  No chest pain with exertion.  No orthopnea or PND.  Comprehensive review of systems otherwise negative. Physical Exam  BP 112/60 (BP Location: Left Arm, Patient Position: Sitting, Cuff Size: Normal)   Pulse 68   Temp (!) 97.3 F (36.3 C) (Temporal)   Ht 5\' 10"  (1.778 m)   Wt 186 lb 9.6 oz (84.6 kg)   SpO2 94%   BMI 26.77 kg/m   Wt Readings from Last 5 Encounters:  12/08/22  186 lb 9.6 oz (84.6 kg)  11/29/22 202 lb 6.1 oz (91.8 kg)  04/01/21 185 lb (83.9 kg)  03/03/21 200 lb (90.7 kg)  02/17/21 209 lb 7 oz (95 kg)    BMI Readings from Last 5 Encounters:  12/08/22 26.77 kg/m  11/29/22 28.23 kg/m  04/01/21 25.80 kg/m  03/03/21 27.89 kg/m  02/17/21 29.21 kg/m     Physical Exam General: Sitting in chair, no acute distress Eyes: EOMI, icterus Neck: Supple, no JVP Pulmonary: Normal work of breathing, on room air Cardiovascular: Warm, no edema Abdomen: Nondistended, Bowel sounds present MSK: No synovitis, no joint effusion Neuro: Normal gait, no weakness Psych: Normal mood, full affect   Assessment & Plan:   Spontaneous pneumothorax: Suspect due to coughing fits in setting of COVID-pneumonia MRSA pneumonia.  Resolved with chest tube placement.  MRSA empyema: Status post chest tube placement.  Residual hydropneumothorax but too small for replacement.  Completed doxycycline course.  Symptoms improving.  Has low bit of tightness on the right as expected.  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  Repeat imaging at next visit to set new baseline.   Return in about 2 months (around 02/07/2023).  Karren Burly, MD 12/08/2022   This appointment required 40 minutes of patient care (this includes precharting, chart review, review of results, face-to-face care, etc.).

## 2022-12-08 NOTE — Patient Instructions (Signed)
Nice to meet you  Glad you are recovering and improving  We will get a chest x-ray in 2 months to establish new baseline after the infection  Return to clinic in 2 months or sooner as needed with Dr. Judeth Horn

## 2023-01-29 DIAGNOSIS — I10 Essential (primary) hypertension: Secondary | ICD-10-CM | POA: Diagnosis not present

## 2023-01-29 DIAGNOSIS — K219 Gastro-esophageal reflux disease without esophagitis: Secondary | ICD-10-CM | POA: Diagnosis not present

## 2023-01-29 DIAGNOSIS — E1165 Type 2 diabetes mellitus with hyperglycemia: Secondary | ICD-10-CM | POA: Diagnosis not present

## 2023-01-29 DIAGNOSIS — E785 Hyperlipidemia, unspecified: Secondary | ICD-10-CM | POA: Diagnosis not present

## 2023-02-19 ENCOUNTER — Ambulatory Visit: Payer: Federal, State, Local not specified - PPO | Admitting: Pulmonary Disease

## 2023-04-16 DIAGNOSIS — E785 Hyperlipidemia, unspecified: Secondary | ICD-10-CM | POA: Diagnosis not present

## 2023-04-16 DIAGNOSIS — I2510 Atherosclerotic heart disease of native coronary artery without angina pectoris: Secondary | ICD-10-CM | POA: Diagnosis not present

## 2023-04-16 DIAGNOSIS — I10 Essential (primary) hypertension: Secondary | ICD-10-CM | POA: Diagnosis not present

## 2023-04-16 DIAGNOSIS — E1165 Type 2 diabetes mellitus with hyperglycemia: Secondary | ICD-10-CM | POA: Diagnosis not present
